# Patient Record
Sex: Female | Born: 1965
Health system: Southern US, Community
[De-identification: ages and names within clinical notes are randomized; demographics above are authoritative.]

## PROBLEM LIST (undated history)

## (undated) DIAGNOSIS — G43909 Migraine, unspecified, not intractable, without status migrainosus: Secondary | ICD-10-CM

## (undated) DIAGNOSIS — F419 Anxiety disorder, unspecified: Secondary | ICD-10-CM

## (undated) DIAGNOSIS — K219 Gastro-esophageal reflux disease without esophagitis: Secondary | ICD-10-CM

## (undated) DIAGNOSIS — R112 Nausea with vomiting, unspecified: Secondary | ICD-10-CM

## (undated) DIAGNOSIS — R7303 Prediabetes: Secondary | ICD-10-CM

## (undated) DIAGNOSIS — F329 Major depressive disorder, single episode, unspecified: Secondary | ICD-10-CM

## (undated) DIAGNOSIS — R32 Unspecified urinary incontinence: Secondary | ICD-10-CM

## (undated) DIAGNOSIS — M1990 Unspecified osteoarthritis, unspecified site: Secondary | ICD-10-CM

## (undated) DIAGNOSIS — Z9889 Other specified postprocedural states: Secondary | ICD-10-CM

## (undated) DIAGNOSIS — R3915 Urgency of urination: Secondary | ICD-10-CM

## (undated) DIAGNOSIS — D649 Anemia, unspecified: Secondary | ICD-10-CM

## (undated) DIAGNOSIS — F32A Depression, unspecified: Secondary | ICD-10-CM

## (undated) DIAGNOSIS — E785 Hyperlipidemia, unspecified: Secondary | ICD-10-CM

## (undated) DIAGNOSIS — I10 Essential (primary) hypertension: Secondary | ICD-10-CM

## (undated) DIAGNOSIS — J449 Chronic obstructive pulmonary disease, unspecified: Secondary | ICD-10-CM

## (undated) DIAGNOSIS — G4730 Sleep apnea, unspecified: Secondary | ICD-10-CM

## (undated) DIAGNOSIS — T7840XA Allergy, unspecified, initial encounter: Secondary | ICD-10-CM

## (undated) DIAGNOSIS — I499 Cardiac arrhythmia, unspecified: Secondary | ICD-10-CM

## (undated) HISTORY — PX: HYSTEROSCOPY W/ ENDOMETRIAL ABLATION: SUR665

## (undated) HISTORY — DX: Chronic obstructive pulmonary disease, unspecified: J44.9

## (undated) HISTORY — DX: Migraine, unspecified, not intractable, without status migrainosus: G43.909

## (undated) HISTORY — DX: Unspecified urinary incontinence: R32

## (undated) HISTORY — PX: MOUTH SURGERY: SHX715

## (undated) HISTORY — DX: Allergy, unspecified, initial encounter: T78.40XA

## (undated) HISTORY — DX: Depression, unspecified: F32.A

## (undated) HISTORY — PX: WISDOM TOOTH EXTRACTION: SHX21

## (undated) HISTORY — DX: Unspecified osteoarthritis, unspecified site: M19.90

## (undated) HISTORY — PX: OTHER SURGICAL HISTORY: SHX169

## (undated) HISTORY — DX: Hyperlipidemia, unspecified: E78.5

## (undated) HISTORY — DX: Anxiety disorder, unspecified: F41.9

## (undated) HISTORY — PX: LEEP: SHX91

## (undated) HISTORY — DX: Anemia, unspecified: D64.9

## (undated) HISTORY — DX: Major depressive disorder, single episode, unspecified: F32.9

## (undated) HISTORY — PX: ABLATION: SHX5711

---

## 1989-06-29 HISTORY — PX: HERNIA REPAIR: SHX51

## 1998-03-18 ENCOUNTER — Encounter: Admission: RE | Admit: 1998-03-18 | Discharge: 1998-03-18 | Payer: Self-pay | Admitting: Specialist

## 1999-08-23 ENCOUNTER — Encounter: Admission: RE | Admit: 1999-08-23 | Discharge: 1999-08-23 | Payer: Self-pay | Admitting: Family Medicine

## 1999-09-13 ENCOUNTER — Encounter: Admission: RE | Admit: 1999-09-13 | Discharge: 1999-09-13 | Payer: Self-pay | Admitting: Family Medicine

## 1999-10-20 ENCOUNTER — Encounter: Admission: RE | Admit: 1999-10-20 | Discharge: 1999-10-20 | Payer: Self-pay | Admitting: Sports Medicine

## 2000-05-09 ENCOUNTER — Encounter: Admission: RE | Admit: 2000-05-09 | Discharge: 2000-05-09 | Payer: Self-pay | Admitting: Family Medicine

## 2000-05-17 ENCOUNTER — Emergency Department (HOSPITAL_COMMUNITY): Admission: EM | Admit: 2000-05-17 | Discharge: 2000-05-17 | Payer: Self-pay | Admitting: Emergency Medicine

## 2000-05-17 ENCOUNTER — Ambulatory Visit (HOSPITAL_COMMUNITY): Admission: RE | Admit: 2000-05-17 | Discharge: 2000-05-17 | Payer: Self-pay

## 2000-05-29 ENCOUNTER — Encounter: Admission: RE | Admit: 2000-05-29 | Discharge: 2000-05-29 | Payer: Self-pay | Admitting: Family Medicine

## 2000-06-11 ENCOUNTER — Encounter: Admission: RE | Admit: 2000-06-11 | Discharge: 2000-06-11 | Payer: Self-pay | Admitting: Family Medicine

## 2000-12-11 ENCOUNTER — Encounter: Admission: RE | Admit: 2000-12-11 | Discharge: 2000-12-11 | Payer: Self-pay | Admitting: Family Medicine

## 2000-12-11 ENCOUNTER — Encounter: Payer: Self-pay | Admitting: Sports Medicine

## 2000-12-11 ENCOUNTER — Ambulatory Visit (HOSPITAL_COMMUNITY): Admission: RE | Admit: 2000-12-11 | Discharge: 2000-12-11 | Payer: Self-pay | Admitting: Family Medicine

## 2000-12-11 ENCOUNTER — Encounter: Admission: RE | Admit: 2000-12-11 | Discharge: 2000-12-11 | Payer: Self-pay | Admitting: Sports Medicine

## 2001-01-01 ENCOUNTER — Encounter: Admission: RE | Admit: 2001-01-01 | Discharge: 2001-01-01 | Payer: Self-pay | Admitting: Family Medicine

## 2001-03-21 ENCOUNTER — Encounter: Admission: RE | Admit: 2001-03-21 | Discharge: 2001-03-21 | Payer: Self-pay | Admitting: Family Medicine

## 2001-04-04 ENCOUNTER — Encounter: Admission: RE | Admit: 2001-04-04 | Discharge: 2001-04-04 | Payer: Self-pay | Admitting: Family Medicine

## 2001-05-25 ENCOUNTER — Inpatient Hospital Stay (HOSPITAL_COMMUNITY): Admission: AD | Admit: 2001-05-25 | Discharge: 2001-05-25 | Payer: Self-pay | Admitting: Obstetrics & Gynecology

## 2001-05-26 ENCOUNTER — Inpatient Hospital Stay (HOSPITAL_COMMUNITY): Admission: AD | Admit: 2001-05-26 | Discharge: 2001-05-26 | Payer: Self-pay | Admitting: Obstetrics & Gynecology

## 2001-05-28 ENCOUNTER — Encounter: Admission: RE | Admit: 2001-05-28 | Discharge: 2001-05-28 | Payer: Self-pay | Admitting: Family Medicine

## 2001-05-30 ENCOUNTER — Encounter: Admission: RE | Admit: 2001-05-30 | Discharge: 2001-05-30 | Payer: Self-pay

## 2001-06-12 ENCOUNTER — Encounter: Admission: RE | Admit: 2001-06-12 | Discharge: 2001-06-12 | Payer: Self-pay | Admitting: Family Medicine

## 2001-08-12 ENCOUNTER — Encounter: Admission: RE | Admit: 2001-08-12 | Discharge: 2001-08-12 | Payer: Self-pay | Admitting: Family Medicine

## 2001-08-14 ENCOUNTER — Emergency Department (HOSPITAL_COMMUNITY): Admission: EM | Admit: 2001-08-14 | Discharge: 2001-08-14 | Payer: Self-pay | Admitting: Emergency Medicine

## 2001-12-05 ENCOUNTER — Encounter: Admission: RE | Admit: 2001-12-05 | Discharge: 2001-12-05 | Payer: Self-pay | Admitting: Family Medicine

## 2002-02-04 ENCOUNTER — Inpatient Hospital Stay (HOSPITAL_COMMUNITY): Admission: AD | Admit: 2002-02-04 | Discharge: 2002-02-04 | Payer: Self-pay | Admitting: Obstetrics and Gynecology

## 2002-02-04 ENCOUNTER — Encounter: Payer: Self-pay | Admitting: Obstetrics and Gynecology

## 2002-02-17 ENCOUNTER — Emergency Department (HOSPITAL_COMMUNITY): Admission: EM | Admit: 2002-02-17 | Discharge: 2002-02-17 | Payer: Self-pay | Admitting: Emergency Medicine

## 2002-02-27 ENCOUNTER — Encounter: Admission: RE | Admit: 2002-02-27 | Discharge: 2002-02-27 | Payer: Self-pay | Admitting: Family Medicine

## 2002-03-01 ENCOUNTER — Emergency Department (HOSPITAL_COMMUNITY): Admission: EM | Admit: 2002-03-01 | Discharge: 2002-03-01 | Payer: Self-pay | Admitting: Emergency Medicine

## 2002-05-26 ENCOUNTER — Encounter: Admission: RE | Admit: 2002-05-26 | Discharge: 2002-05-26 | Payer: Self-pay | Admitting: Family Medicine

## 2002-06-26 ENCOUNTER — Encounter: Admission: RE | Admit: 2002-06-26 | Discharge: 2002-06-26 | Payer: Self-pay | Admitting: Family Medicine

## 2002-07-27 ENCOUNTER — Encounter: Admission: RE | Admit: 2002-07-27 | Discharge: 2002-07-27 | Payer: Self-pay | Admitting: Family Medicine

## 2002-11-02 ENCOUNTER — Encounter: Payer: Self-pay | Admitting: Emergency Medicine

## 2002-11-02 ENCOUNTER — Emergency Department (HOSPITAL_COMMUNITY): Admission: EM | Admit: 2002-11-02 | Discharge: 2002-11-02 | Payer: Self-pay | Admitting: Emergency Medicine

## 2002-12-04 ENCOUNTER — Encounter: Admission: RE | Admit: 2002-12-04 | Discharge: 2002-12-04 | Payer: Self-pay | Admitting: Family Medicine

## 2002-12-15 ENCOUNTER — Emergency Department (HOSPITAL_COMMUNITY): Admission: EM | Admit: 2002-12-15 | Discharge: 2002-12-15 | Payer: Self-pay | Admitting: Emergency Medicine

## 2002-12-18 ENCOUNTER — Encounter: Admission: RE | Admit: 2002-12-18 | Discharge: 2002-12-18 | Payer: Self-pay | Admitting: Family Medicine

## 2003-01-18 ENCOUNTER — Encounter: Admission: RE | Admit: 2003-01-18 | Discharge: 2003-01-18 | Payer: Self-pay | Admitting: Family Medicine

## 2003-01-19 ENCOUNTER — Encounter: Payer: Self-pay | Admitting: Sports Medicine

## 2003-01-19 ENCOUNTER — Encounter: Admission: RE | Admit: 2003-01-19 | Discharge: 2003-01-19 | Payer: Self-pay | Admitting: Sports Medicine

## 2003-02-03 ENCOUNTER — Encounter: Admission: RE | Admit: 2003-02-03 | Discharge: 2003-02-03 | Payer: Self-pay | Admitting: Sports Medicine

## 2003-02-09 ENCOUNTER — Encounter: Admission: RE | Admit: 2003-02-09 | Discharge: 2003-02-09 | Payer: Self-pay | Admitting: Sports Medicine

## 2003-02-09 ENCOUNTER — Encounter: Payer: Self-pay | Admitting: Sports Medicine

## 2003-02-11 ENCOUNTER — Encounter: Admission: RE | Admit: 2003-02-11 | Discharge: 2003-02-11 | Payer: Self-pay | Admitting: Sports Medicine

## 2003-03-01 ENCOUNTER — Encounter: Payer: Self-pay | Admitting: Obstetrics and Gynecology

## 2003-03-01 ENCOUNTER — Inpatient Hospital Stay (HOSPITAL_COMMUNITY): Admission: AD | Admit: 2003-03-01 | Discharge: 2003-03-01 | Payer: Self-pay | Admitting: Obstetrics and Gynecology

## 2003-04-06 ENCOUNTER — Encounter: Admission: RE | Admit: 2003-04-06 | Discharge: 2003-04-06 | Payer: Self-pay | Admitting: Family Medicine

## 2003-09-09 ENCOUNTER — Encounter: Admission: RE | Admit: 2003-09-09 | Discharge: 2003-09-09 | Payer: Self-pay | Admitting: Family Medicine

## 2003-09-15 ENCOUNTER — Encounter: Admission: RE | Admit: 2003-09-15 | Discharge: 2003-09-15 | Payer: Self-pay | Admitting: Sports Medicine

## 2003-11-04 ENCOUNTER — Encounter: Admission: RE | Admit: 2003-11-04 | Discharge: 2003-11-04 | Payer: Self-pay | Admitting: Family Medicine

## 2003-12-20 ENCOUNTER — Encounter: Admission: RE | Admit: 2003-12-20 | Discharge: 2003-12-20 | Payer: Self-pay | Admitting: Family Medicine

## 2004-01-05 ENCOUNTER — Encounter: Admission: RE | Admit: 2004-01-05 | Discharge: 2004-01-05 | Payer: Self-pay | Admitting: Family Medicine

## 2004-01-05 ENCOUNTER — Encounter: Admission: RE | Admit: 2004-01-05 | Discharge: 2004-01-05 | Payer: Self-pay | Admitting: Sports Medicine

## 2004-01-14 ENCOUNTER — Encounter: Admission: RE | Admit: 2004-01-14 | Discharge: 2004-01-14 | Payer: Self-pay | Admitting: Sports Medicine

## 2004-03-25 ENCOUNTER — Emergency Department (HOSPITAL_COMMUNITY): Admission: EM | Admit: 2004-03-25 | Discharge: 2004-03-25 | Payer: Self-pay | Admitting: Emergency Medicine

## 2004-03-30 ENCOUNTER — Encounter: Admission: RE | Admit: 2004-03-30 | Discharge: 2004-03-30 | Payer: Self-pay | Admitting: Family Medicine

## 2004-04-16 ENCOUNTER — Emergency Department (HOSPITAL_COMMUNITY): Admission: EM | Admit: 2004-04-16 | Discharge: 2004-04-16 | Payer: Self-pay | Admitting: Emergency Medicine

## 2004-08-29 ENCOUNTER — Ambulatory Visit: Payer: Self-pay | Admitting: Sports Medicine

## 2004-10-24 ENCOUNTER — Emergency Department (HOSPITAL_COMMUNITY): Admission: EM | Admit: 2004-10-24 | Discharge: 2004-10-24 | Payer: Self-pay | Admitting: Emergency Medicine

## 2004-12-05 ENCOUNTER — Ambulatory Visit: Payer: Self-pay | Admitting: Family Medicine

## 2004-12-14 ENCOUNTER — Emergency Department (HOSPITAL_COMMUNITY): Admission: EM | Admit: 2004-12-14 | Discharge: 2004-12-14 | Payer: Self-pay | Admitting: Family Medicine

## 2005-01-02 ENCOUNTER — Ambulatory Visit: Payer: Self-pay | Admitting: Family Medicine

## 2005-01-31 ENCOUNTER — Ambulatory Visit: Payer: Self-pay | Admitting: Family Medicine

## 2005-02-26 ENCOUNTER — Ambulatory Visit: Payer: Self-pay | Admitting: Sports Medicine

## 2005-04-10 ENCOUNTER — Inpatient Hospital Stay (HOSPITAL_COMMUNITY): Admission: AD | Admit: 2005-04-10 | Discharge: 2005-04-11 | Payer: Self-pay | Admitting: *Deleted

## 2005-04-11 ENCOUNTER — Ambulatory Visit: Payer: Self-pay | Admitting: Family Medicine

## 2005-04-18 ENCOUNTER — Inpatient Hospital Stay (HOSPITAL_COMMUNITY): Admission: RE | Admit: 2005-04-18 | Discharge: 2005-04-18 | Payer: Self-pay | Admitting: *Deleted

## 2005-05-09 ENCOUNTER — Ambulatory Visit: Payer: Self-pay | Admitting: Family Medicine

## 2005-05-26 ENCOUNTER — Inpatient Hospital Stay (HOSPITAL_COMMUNITY): Admission: AD | Admit: 2005-05-26 | Discharge: 2005-05-26 | Payer: Self-pay | Admitting: Family Medicine

## 2005-05-29 ENCOUNTER — Ambulatory Visit: Payer: Self-pay | Admitting: Family Medicine

## 2005-05-30 ENCOUNTER — Inpatient Hospital Stay (HOSPITAL_COMMUNITY): Admission: AD | Admit: 2005-05-30 | Discharge: 2005-05-30 | Payer: Self-pay | Admitting: Obstetrics and Gynecology

## 2005-05-31 ENCOUNTER — Ambulatory Visit (HOSPITAL_COMMUNITY): Admission: AD | Admit: 2005-05-31 | Discharge: 2005-05-31 | Payer: Self-pay | Admitting: Gynecology

## 2005-05-31 ENCOUNTER — Encounter (INDEPENDENT_AMBULATORY_CARE_PROVIDER_SITE_OTHER): Payer: Self-pay | Admitting: Specialist

## 2005-05-31 HISTORY — PX: DILATION AND CURETTAGE OF UTERUS: SHX78

## 2005-06-15 ENCOUNTER — Ambulatory Visit: Payer: Self-pay | Admitting: Family Medicine

## 2005-08-08 ENCOUNTER — Ambulatory Visit: Payer: Self-pay | Admitting: Family Medicine

## 2005-08-29 ENCOUNTER — Encounter (INDEPENDENT_AMBULATORY_CARE_PROVIDER_SITE_OTHER): Payer: Self-pay | Admitting: *Deleted

## 2005-09-04 ENCOUNTER — Ambulatory Visit: Payer: Self-pay | Admitting: Family Medicine

## 2005-09-05 ENCOUNTER — Emergency Department (HOSPITAL_COMMUNITY): Admission: EM | Admit: 2005-09-05 | Discharge: 2005-09-06 | Payer: Self-pay | Admitting: Emergency Medicine

## 2005-09-13 ENCOUNTER — Ambulatory Visit: Payer: Self-pay | Admitting: Sports Medicine

## 2005-11-07 ENCOUNTER — Ambulatory Visit (HOSPITAL_COMMUNITY): Admission: RE | Admit: 2005-11-07 | Discharge: 2005-11-07 | Payer: Self-pay | Admitting: Emergency Medicine

## 2005-11-20 ENCOUNTER — Encounter: Admission: RE | Admit: 2005-11-20 | Discharge: 2005-11-20 | Payer: Self-pay | Admitting: *Deleted

## 2005-11-20 ENCOUNTER — Ambulatory Visit: Payer: Self-pay | Admitting: Family Medicine

## 2005-11-21 ENCOUNTER — Ambulatory Visit (HOSPITAL_COMMUNITY): Admission: RE | Admit: 2005-11-21 | Discharge: 2005-11-21 | Payer: Self-pay | Admitting: *Deleted

## 2005-11-30 ENCOUNTER — Ambulatory Visit: Payer: Self-pay | Admitting: Family Medicine

## 2005-12-06 ENCOUNTER — Ambulatory Visit (HOSPITAL_COMMUNITY): Admission: RE | Admit: 2005-12-06 | Discharge: 2005-12-06 | Payer: Self-pay | Admitting: *Deleted

## 2005-12-10 ENCOUNTER — Ambulatory Visit: Payer: Self-pay | Admitting: Family Medicine

## 2005-12-12 ENCOUNTER — Ambulatory Visit: Payer: Self-pay | Admitting: Family Medicine

## 2005-12-18 ENCOUNTER — Ambulatory Visit: Payer: Self-pay | Admitting: Family Medicine

## 2006-01-07 ENCOUNTER — Ambulatory Visit: Payer: Self-pay | Admitting: Sports Medicine

## 2006-02-18 ENCOUNTER — Ambulatory Visit: Payer: Self-pay | Admitting: Family Medicine

## 2006-02-19 ENCOUNTER — Ambulatory Visit (HOSPITAL_COMMUNITY): Admission: RE | Admit: 2006-02-19 | Discharge: 2006-02-19 | Payer: Self-pay | Admitting: Family Medicine

## 2006-05-31 ENCOUNTER — Ambulatory Visit: Payer: Self-pay | Admitting: Sports Medicine

## 2006-08-09 ENCOUNTER — Ambulatory Visit: Payer: Self-pay | Admitting: Family Medicine

## 2006-08-12 ENCOUNTER — Ambulatory Visit (HOSPITAL_COMMUNITY): Admission: RE | Admit: 2006-08-12 | Discharge: 2006-08-12 | Payer: Self-pay | Admitting: Sports Medicine

## 2006-08-19 ENCOUNTER — Emergency Department (HOSPITAL_COMMUNITY): Admission: EM | Admit: 2006-08-19 | Discharge: 2006-08-19 | Payer: Self-pay | Admitting: Emergency Medicine

## 2006-09-15 ENCOUNTER — Inpatient Hospital Stay (HOSPITAL_COMMUNITY): Admission: AD | Admit: 2006-09-15 | Discharge: 2006-09-15 | Payer: Self-pay | Admitting: Gynecology

## 2006-09-16 ENCOUNTER — Ambulatory Visit: Payer: Self-pay | Admitting: Family Medicine

## 2006-09-25 ENCOUNTER — Ambulatory Visit: Payer: Self-pay | Admitting: Family Medicine

## 2006-10-10 ENCOUNTER — Ambulatory Visit: Payer: Self-pay | Admitting: Gynecology

## 2006-11-01 ENCOUNTER — Ambulatory Visit: Payer: Self-pay | Admitting: Gynecology

## 2006-11-01 ENCOUNTER — Ambulatory Visit (HOSPITAL_COMMUNITY): Admission: RE | Admit: 2006-11-01 | Discharge: 2006-11-01 | Payer: Self-pay | Admitting: Gynecology

## 2006-11-01 HISTORY — PX: TUBAL LIGATION: SHX77

## 2006-11-04 ENCOUNTER — Ambulatory Visit: Payer: Self-pay | Admitting: Sports Medicine

## 2006-12-26 DIAGNOSIS — J309 Allergic rhinitis, unspecified: Secondary | ICD-10-CM | POA: Insufficient documentation

## 2006-12-26 DIAGNOSIS — N800 Endometriosis of uterus: Secondary | ICD-10-CM | POA: Insufficient documentation

## 2006-12-26 DIAGNOSIS — F418 Other specified anxiety disorders: Secondary | ICD-10-CM | POA: Insufficient documentation

## 2006-12-26 DIAGNOSIS — N8003 Adenomyosis of the uterus: Secondary | ICD-10-CM | POA: Insufficient documentation

## 2006-12-26 DIAGNOSIS — K219 Gastro-esophageal reflux disease without esophagitis: Secondary | ICD-10-CM | POA: Insufficient documentation

## 2006-12-26 DIAGNOSIS — N809 Endometriosis, unspecified: Secondary | ICD-10-CM

## 2006-12-26 DIAGNOSIS — G43909 Migraine, unspecified, not intractable, without status migrainosus: Secondary | ICD-10-CM | POA: Insufficient documentation

## 2006-12-26 DIAGNOSIS — J45909 Unspecified asthma, uncomplicated: Secondary | ICD-10-CM | POA: Insufficient documentation

## 2006-12-27 ENCOUNTER — Encounter (INDEPENDENT_AMBULATORY_CARE_PROVIDER_SITE_OTHER): Payer: Self-pay | Admitting: *Deleted

## 2007-04-04 ENCOUNTER — Ambulatory Visit: Payer: Self-pay | Admitting: Family Medicine

## 2007-04-04 LAB — CONVERTED CEMR LAB
Glucose, Urine, Semiquant: NEGATIVE
Nitrite: NEGATIVE
Protein, U semiquant: NEGATIVE
Specific Gravity, Urine: 1.025
Urobilinogen, UA: 1
WBC Urine, dipstick: NEGATIVE
pH: 7

## 2007-05-05 ENCOUNTER — Telehealth: Payer: Self-pay | Admitting: *Deleted

## 2007-05-06 ENCOUNTER — Ambulatory Visit: Payer: Self-pay | Admitting: Family Medicine

## 2007-05-06 ENCOUNTER — Encounter: Admission: RE | Admit: 2007-05-06 | Discharge: 2007-05-06 | Payer: Self-pay | Admitting: Sports Medicine

## 2007-05-08 ENCOUNTER — Encounter (INDEPENDENT_AMBULATORY_CARE_PROVIDER_SITE_OTHER): Payer: Self-pay | Admitting: Family Medicine

## 2007-07-22 ENCOUNTER — Telehealth (INDEPENDENT_AMBULATORY_CARE_PROVIDER_SITE_OTHER): Payer: Self-pay | Admitting: *Deleted

## 2007-07-23 ENCOUNTER — Encounter (INDEPENDENT_AMBULATORY_CARE_PROVIDER_SITE_OTHER): Payer: Self-pay | Admitting: *Deleted

## 2007-07-23 ENCOUNTER — Ambulatory Visit: Payer: Self-pay | Admitting: Family Medicine

## 2007-07-23 ENCOUNTER — Telehealth: Payer: Self-pay | Admitting: *Deleted

## 2007-09-04 ENCOUNTER — Ambulatory Visit: Payer: Self-pay | Admitting: Family Medicine

## 2007-09-04 ENCOUNTER — Encounter (INDEPENDENT_AMBULATORY_CARE_PROVIDER_SITE_OTHER): Payer: Self-pay | Admitting: Family Medicine

## 2007-09-04 LAB — CONVERTED CEMR LAB
AST: 18 units/L (ref 0–37)
BUN: 17 mg/dL (ref 6–23)
CO2: 26 meq/L (ref 19–32)
Calcium: 9.7 mg/dL (ref 8.4–10.5)
Chlamydia, DNA Probe: NEGATIVE
Cholesterol: 213 mg/dL — ABNORMAL HIGH (ref 0–200)
HDL: 63 mg/dL (ref >39)
LDL Cholesterol: 125 mg/dL — ABNORMAL HIGH (ref 0–99)
Sodium: 136 meq/L (ref 135–145)
Total CHOL/HDL Ratio: 3.4
Total Protein: 8 g/dL (ref 6.0–8.3)
Triglycerides: 126 mg/dL (ref <150)
VLDL: 25 mg/dL (ref 0–40)

## 2007-09-09 ENCOUNTER — Encounter (INDEPENDENT_AMBULATORY_CARE_PROVIDER_SITE_OTHER): Payer: Self-pay | Admitting: Family Medicine

## 2007-11-21 ENCOUNTER — Encounter: Payer: Self-pay | Admitting: *Deleted

## 2007-11-21 ENCOUNTER — Ambulatory Visit: Payer: Self-pay | Admitting: Family Medicine

## 2007-11-21 ENCOUNTER — Telehealth (INDEPENDENT_AMBULATORY_CARE_PROVIDER_SITE_OTHER): Payer: Self-pay | Admitting: *Deleted

## 2007-12-22 ENCOUNTER — Emergency Department (HOSPITAL_COMMUNITY): Admission: EM | Admit: 2007-12-22 | Discharge: 2007-12-23 | Payer: Self-pay | Admitting: Emergency Medicine

## 2007-12-25 ENCOUNTER — Encounter (INDEPENDENT_AMBULATORY_CARE_PROVIDER_SITE_OTHER): Payer: Self-pay | Admitting: Family Medicine

## 2007-12-25 ENCOUNTER — Ambulatory Visit: Payer: Self-pay | Admitting: Family Medicine

## 2007-12-26 ENCOUNTER — Telehealth: Payer: Self-pay | Admitting: *Deleted

## 2007-12-26 ENCOUNTER — Encounter (INDEPENDENT_AMBULATORY_CARE_PROVIDER_SITE_OTHER): Payer: Self-pay | Admitting: *Deleted

## 2007-12-31 ENCOUNTER — Encounter (INDEPENDENT_AMBULATORY_CARE_PROVIDER_SITE_OTHER): Payer: Self-pay | Admitting: Family Medicine

## 2008-02-13 ENCOUNTER — Encounter: Payer: Self-pay | Admitting: *Deleted

## 2008-03-15 ENCOUNTER — Telehealth: Payer: Self-pay | Admitting: *Deleted

## 2008-03-16 ENCOUNTER — Ambulatory Visit: Payer: Self-pay | Admitting: Family Medicine

## 2008-03-16 LAB — CONVERTED CEMR LAB
Blood in Urine, dipstick: NEGATIVE
Glucose, Urine, Semiquant: NEGATIVE
Nitrite: NEGATIVE
Urobilinogen, UA: 0.2

## 2008-03-18 ENCOUNTER — Telehealth: Payer: Self-pay | Admitting: *Deleted

## 2008-05-21 ENCOUNTER — Encounter (INDEPENDENT_AMBULATORY_CARE_PROVIDER_SITE_OTHER): Payer: Self-pay | Admitting: Family Medicine

## 2008-05-21 ENCOUNTER — Ambulatory Visit: Payer: Self-pay | Admitting: Family Medicine

## 2008-06-18 ENCOUNTER — Encounter: Payer: Self-pay | Admitting: *Deleted

## 2008-06-30 ENCOUNTER — Ambulatory Visit: Payer: Self-pay | Admitting: Family Medicine

## 2008-07-07 ENCOUNTER — Ambulatory Visit (HOSPITAL_BASED_OUTPATIENT_CLINIC_OR_DEPARTMENT_OTHER): Admission: RE | Admit: 2008-07-07 | Discharge: 2008-07-07 | Payer: Self-pay | Admitting: Family Medicine

## 2008-07-10 ENCOUNTER — Encounter (INDEPENDENT_AMBULATORY_CARE_PROVIDER_SITE_OTHER): Payer: Self-pay | Admitting: Family Medicine

## 2008-07-10 ENCOUNTER — Ambulatory Visit: Payer: Self-pay | Admitting: Internal Medicine

## 2008-08-03 ENCOUNTER — Encounter: Payer: Self-pay | Admitting: *Deleted

## 2008-08-18 ENCOUNTER — Ambulatory Visit: Payer: Self-pay | Admitting: Family Medicine

## 2008-08-18 ENCOUNTER — Encounter: Admission: RE | Admit: 2008-08-18 | Discharge: 2008-08-18 | Payer: Self-pay | Admitting: Family Medicine

## 2008-08-18 ENCOUNTER — Encounter (INDEPENDENT_AMBULATORY_CARE_PROVIDER_SITE_OTHER): Payer: Self-pay | Admitting: Family Medicine

## 2008-08-18 LAB — CONVERTED CEMR LAB
CO2: 25 meq/L (ref 19–32)
Calcium: 9.3 mg/dL (ref 8.4–10.5)

## 2008-08-19 ENCOUNTER — Telehealth: Payer: Self-pay | Admitting: *Deleted

## 2008-08-20 ENCOUNTER — Ambulatory Visit: Payer: Self-pay | Admitting: Vascular Surgery

## 2008-08-20 ENCOUNTER — Ambulatory Visit (HOSPITAL_COMMUNITY): Admission: RE | Admit: 2008-08-20 | Discharge: 2008-08-20 | Payer: Self-pay | Admitting: Family Medicine

## 2008-08-20 ENCOUNTER — Encounter: Payer: Self-pay | Admitting: Family Medicine

## 2008-08-20 ENCOUNTER — Telehealth (INDEPENDENT_AMBULATORY_CARE_PROVIDER_SITE_OTHER): Payer: Self-pay | Admitting: Family Medicine

## 2008-08-23 ENCOUNTER — Telehealth (INDEPENDENT_AMBULATORY_CARE_PROVIDER_SITE_OTHER): Payer: Self-pay | Admitting: Family Medicine

## 2008-08-25 ENCOUNTER — Ambulatory Visit: Payer: Self-pay | Admitting: Sports Medicine

## 2008-08-25 ENCOUNTER — Encounter (INDEPENDENT_AMBULATORY_CARE_PROVIDER_SITE_OTHER): Payer: Self-pay | Admitting: Family Medicine

## 2008-09-02 ENCOUNTER — Ambulatory Visit: Payer: Self-pay | Admitting: Sports Medicine

## 2008-09-17 ENCOUNTER — Ambulatory Visit: Payer: Self-pay | Admitting: Sports Medicine

## 2008-09-20 ENCOUNTER — Encounter (INDEPENDENT_AMBULATORY_CARE_PROVIDER_SITE_OTHER): Payer: Self-pay | Admitting: Family Medicine

## 2008-09-20 ENCOUNTER — Ambulatory Visit: Payer: Self-pay | Admitting: Family Medicine

## 2008-09-20 DIAGNOSIS — E7800 Pure hypercholesterolemia, unspecified: Secondary | ICD-10-CM | POA: Insufficient documentation

## 2008-09-20 DIAGNOSIS — N951 Menopausal and female climacteric states: Secondary | ICD-10-CM | POA: Insufficient documentation

## 2008-09-20 DIAGNOSIS — N920 Excessive and frequent menstruation with regular cycle: Secondary | ICD-10-CM | POA: Insufficient documentation

## 2008-09-20 DIAGNOSIS — E785 Hyperlipidemia, unspecified: Secondary | ICD-10-CM

## 2008-09-30 ENCOUNTER — Encounter (INDEPENDENT_AMBULATORY_CARE_PROVIDER_SITE_OTHER): Payer: Self-pay | Admitting: Family Medicine

## 2008-09-30 LAB — CONVERTED CEMR LAB
ALT: 12 units/L (ref 0–35)
Alkaline Phosphatase: 42 units/L (ref 39–117)
BUN: 12 mg/dL (ref 6–23)
CO2: 25 meq/L (ref 19–32)
Calcium: 9.4 mg/dL (ref 8.4–10.5)
Chlamydia, DNA Probe: NEGATIVE
Chloride: 103 meq/L (ref 96–112)
Cholesterol: 199 mg/dL (ref 0–200)
GC Probe Amp, Genital: NEGATIVE
HDL: 53 mg/dL (ref >39)
Hemoglobin: 12 g/dL (ref 12.0–15.0)
LDL Cholesterol: 129 mg/dL — ABNORMAL HIGH (ref 0–99)
Potassium: 4.1 meq/L (ref 3.5–5.3)
RBC: 3.85 M/uL — ABNORMAL LOW (ref 3.87–5.11)
RDW: 13 % (ref 11.5–15.5)
Total Bilirubin: 0.3 mg/dL (ref 0.3–1.2)
Total Protein: 7.1 g/dL (ref 6.0–8.3)
VLDL: 17 mg/dL (ref 0–40)
Vit D, 1,25-Dihydroxy: 10 — ABNORMAL LOW (ref 30–89)

## 2008-10-12 ENCOUNTER — Encounter (INDEPENDENT_AMBULATORY_CARE_PROVIDER_SITE_OTHER): Payer: Self-pay | Admitting: Family Medicine

## 2008-10-15 ENCOUNTER — Ambulatory Visit: Payer: Self-pay | Admitting: Sports Medicine

## 2008-10-19 ENCOUNTER — Encounter: Admission: RE | Admit: 2008-10-19 | Discharge: 2008-10-19 | Payer: Self-pay | Admitting: Family Medicine

## 2008-11-08 ENCOUNTER — Ambulatory Visit: Payer: Self-pay | Admitting: Family Medicine

## 2008-11-08 ENCOUNTER — Telehealth: Payer: Self-pay | Admitting: *Deleted

## 2008-11-08 LAB — CONVERTED CEMR LAB
Blood in Urine, dipstick: NEGATIVE
Glucose, Urine, Semiquant: NEGATIVE
Protein, U semiquant: NEGATIVE
pH: 5.5

## 2008-11-24 ENCOUNTER — Ambulatory Visit: Payer: Self-pay | Admitting: Sports Medicine

## 2008-12-10 ENCOUNTER — Emergency Department (HOSPITAL_COMMUNITY): Admission: EM | Admit: 2008-12-10 | Discharge: 2008-12-10 | Payer: Self-pay | Admitting: Emergency Medicine

## 2008-12-13 ENCOUNTER — Telehealth (INDEPENDENT_AMBULATORY_CARE_PROVIDER_SITE_OTHER): Payer: Self-pay | Admitting: Family Medicine

## 2009-02-14 ENCOUNTER — Ambulatory Visit: Payer: Self-pay | Admitting: Family Medicine

## 2009-02-14 ENCOUNTER — Encounter (INDEPENDENT_AMBULATORY_CARE_PROVIDER_SITE_OTHER): Payer: Self-pay | Admitting: Family Medicine

## 2009-02-14 LAB — CONVERTED CEMR LAB
Bilirubin Urine: NEGATIVE
Glucose, Urine, Semiquant: NEGATIVE
Nitrite: NEGATIVE
Specific Gravity, Urine: 1.025
Whiff Test: NEGATIVE
pH: 5.5

## 2009-02-15 DIAGNOSIS — N3946 Mixed incontinence: Secondary | ICD-10-CM | POA: Insufficient documentation

## 2009-02-15 LAB — CONVERTED CEMR LAB: Chlamydia, DNA Probe: NEGATIVE

## 2009-02-24 ENCOUNTER — Telehealth (INDEPENDENT_AMBULATORY_CARE_PROVIDER_SITE_OTHER): Payer: Self-pay | Admitting: *Deleted

## 2009-02-24 ENCOUNTER — Encounter (INDEPENDENT_AMBULATORY_CARE_PROVIDER_SITE_OTHER): Payer: Self-pay | Admitting: Family Medicine

## 2009-03-05 ENCOUNTER — Telehealth: Payer: Self-pay | Admitting: Family Medicine

## 2009-04-11 ENCOUNTER — Telehealth: Payer: Self-pay | Admitting: Family Medicine

## 2009-04-12 ENCOUNTER — Ambulatory Visit: Payer: Self-pay | Admitting: Family Medicine

## 2009-04-21 ENCOUNTER — Telehealth: Payer: Self-pay | Admitting: Family Medicine

## 2009-04-22 ENCOUNTER — Ambulatory Visit: Payer: Self-pay | Admitting: Family Medicine

## 2009-04-22 ENCOUNTER — Encounter: Payer: Self-pay | Admitting: Family Medicine

## 2009-04-22 ENCOUNTER — Encounter (INDEPENDENT_AMBULATORY_CARE_PROVIDER_SITE_OTHER): Payer: Self-pay | Admitting: *Deleted

## 2009-04-22 LAB — CONVERTED CEMR LAB
Basophils Absolute: 0 10*3/uL (ref 0.0–0.1)
Basophils Relative: 1 % (ref 0–1)
Eosinophils Absolute: 0.1 10*3/uL (ref 0.0–0.7)
Eosinophils Relative: 2 % (ref 0–5)
Glucose, Urine, Semiquant: NEGATIVE
Ketones, urine, test strip: NEGATIVE
Lymphocytes Relative: 32 % (ref 12–46)
Monocytes Relative: 11 % (ref 3–12)
Neutro Abs: 2.7 10*3/uL (ref 1.7–7.7)
Neutrophils Relative %: 54 % (ref 43–77)
Nitrite: NEGATIVE
Platelets: 360 10*3/uL (ref 150–400)
Whiff Test: NEGATIVE

## 2009-04-24 ENCOUNTER — Encounter: Payer: Self-pay | Admitting: Family Medicine

## 2009-04-26 ENCOUNTER — Ambulatory Visit: Payer: Self-pay | Admitting: Family Medicine

## 2009-04-26 ENCOUNTER — Telehealth: Payer: Self-pay | Admitting: Family Medicine

## 2009-05-17 ENCOUNTER — Ambulatory Visit: Payer: Self-pay | Admitting: Family Medicine

## 2009-05-17 ENCOUNTER — Telehealth: Payer: Self-pay | Admitting: Family Medicine

## 2009-05-17 DIAGNOSIS — N3289 Other specified disorders of bladder: Secondary | ICD-10-CM | POA: Insufficient documentation

## 2009-05-17 LAB — CONVERTED CEMR LAB
Bilirubin Urine: NEGATIVE
Blood in Urine, dipstick: NEGATIVE
Glucose, Urine, Semiquant: NEGATIVE
Ketones, urine, test strip: NEGATIVE

## 2009-05-19 ENCOUNTER — Ambulatory Visit (HOSPITAL_COMMUNITY): Admission: RE | Admit: 2009-05-19 | Discharge: 2009-05-19 | Payer: Self-pay | Admitting: Family Medicine

## 2009-05-23 ENCOUNTER — Encounter: Payer: Self-pay | Admitting: Family Medicine

## 2009-06-07 ENCOUNTER — Ambulatory Visit: Payer: Self-pay | Admitting: Family Medicine

## 2009-06-07 ENCOUNTER — Encounter: Payer: Self-pay | Admitting: Family Medicine

## 2009-06-08 ENCOUNTER — Telehealth: Payer: Self-pay | Admitting: *Deleted

## 2009-06-08 ENCOUNTER — Telehealth: Payer: Self-pay | Admitting: Family Medicine

## 2009-06-12 ENCOUNTER — Telehealth: Payer: Self-pay | Admitting: Family Medicine

## 2009-06-13 ENCOUNTER — Telehealth: Payer: Self-pay | Admitting: Family Medicine

## 2009-06-13 ENCOUNTER — Ambulatory Visit: Payer: Self-pay | Admitting: Family Medicine

## 2009-07-05 ENCOUNTER — Encounter: Admission: RE | Admit: 2009-07-05 | Discharge: 2009-07-28 | Payer: Self-pay | Admitting: Family Medicine

## 2009-08-31 ENCOUNTER — Ambulatory Visit: Payer: Self-pay | Admitting: Family Medicine

## 2009-08-31 ENCOUNTER — Encounter: Payer: Self-pay | Admitting: Family Medicine

## 2009-08-31 LAB — CONVERTED CEMR LAB
Chlamydia, DNA Probe: NEGATIVE
GC Probe Amp, Genital: NEGATIVE
HCT: 37.2 % (ref 36.0–46.0)
MCHC: 32.5 g/dL (ref 30.0–36.0)
MCV: 94.2 fL (ref 78.0–100.0)
RBC: 3.95 M/uL (ref 3.87–5.11)
RDW: 13.2 % (ref 11.5–15.5)

## 2009-09-01 ENCOUNTER — Encounter: Payer: Self-pay | Admitting: Family Medicine

## 2009-09-25 ENCOUNTER — Emergency Department (HOSPITAL_COMMUNITY): Admission: EM | Admit: 2009-09-25 | Discharge: 2009-09-25 | Payer: Self-pay | Admitting: Emergency Medicine

## 2009-09-28 ENCOUNTER — Ambulatory Visit: Payer: Self-pay | Admitting: Family Medicine

## 2009-09-29 ENCOUNTER — Encounter: Admission: RE | Admit: 2009-09-29 | Discharge: 2009-09-29 | Payer: Self-pay | Admitting: Family Medicine

## 2009-10-31 ENCOUNTER — Ambulatory Visit: Payer: Self-pay | Admitting: Family Medicine

## 2009-11-29 ENCOUNTER — Ambulatory Visit: Payer: Self-pay | Admitting: Family Medicine

## 2009-11-29 ENCOUNTER — Telehealth: Payer: Self-pay | Admitting: Family Medicine

## 2009-12-14 ENCOUNTER — Emergency Department (HOSPITAL_COMMUNITY): Admission: EM | Admit: 2009-12-14 | Discharge: 2009-12-15 | Payer: Self-pay | Admitting: Emergency Medicine

## 2010-03-14 ENCOUNTER — Ambulatory Visit: Payer: Self-pay | Admitting: Family Medicine

## 2010-03-14 ENCOUNTER — Encounter: Admission: RE | Admit: 2010-03-14 | Discharge: 2010-03-14 | Payer: Self-pay | Admitting: Family Medicine

## 2010-03-14 LAB — CONVERTED CEMR LAB
CO2: 30 meq/L (ref 19–32)
Chloride: 103 meq/L (ref 96–112)
Glucose, Bld: 89 mg/dL (ref 70–99)
Hemoglobin: 12.9 g/dL (ref 12.0–15.0)
MCV: 94.5 fL (ref 78.0–100.0)
Platelets: 306 10*3/uL (ref 150–400)
RBC: 3.95 M/uL (ref 3.87–5.11)
RDW: 13.1 % (ref 11.5–15.5)
WBC: 4.4 10*3/uL (ref 4.0–10.5)

## 2010-03-15 ENCOUNTER — Ambulatory Visit: Payer: Self-pay | Admitting: Family Medicine

## 2010-03-15 ENCOUNTER — Encounter (INDEPENDENT_AMBULATORY_CARE_PROVIDER_SITE_OTHER): Payer: Self-pay | Admitting: *Deleted

## 2010-04-05 ENCOUNTER — Encounter: Payer: Self-pay | Admitting: *Deleted

## 2010-04-05 ENCOUNTER — Ambulatory Visit: Payer: Self-pay | Admitting: Family Medicine

## 2010-04-14 ENCOUNTER — Encounter: Payer: Self-pay | Admitting: Family Medicine

## 2010-05-11 ENCOUNTER — Encounter: Payer: Self-pay | Admitting: Family Medicine

## 2010-05-11 ENCOUNTER — Ambulatory Visit: Payer: Self-pay | Admitting: Family Medicine

## 2010-05-11 DIAGNOSIS — R5383 Other fatigue: Secondary | ICD-10-CM

## 2010-05-11 DIAGNOSIS — R5381 Other malaise: Secondary | ICD-10-CM | POA: Insufficient documentation

## 2010-05-11 LAB — CONVERTED CEMR LAB: TSH: 2.324 microintl units/mL (ref 0.350–4.500)

## 2010-06-01 ENCOUNTER — Telehealth: Payer: Self-pay | Admitting: Sports Medicine

## 2010-06-02 ENCOUNTER — Observation Stay (HOSPITAL_COMMUNITY): Admission: EM | Admit: 2010-06-02 | Discharge: 2010-06-03 | Payer: Self-pay | Admitting: Emergency Medicine

## 2010-06-02 ENCOUNTER — Ambulatory Visit: Payer: Self-pay | Admitting: Family Medicine

## 2010-06-02 ENCOUNTER — Encounter: Payer: Self-pay | Admitting: Family Medicine

## 2010-06-02 DIAGNOSIS — R079 Chest pain, unspecified: Secondary | ICD-10-CM | POA: Insufficient documentation

## 2010-06-08 ENCOUNTER — Ambulatory Visit: Payer: Self-pay | Admitting: Family Medicine

## 2010-06-23 ENCOUNTER — Ambulatory Visit: Payer: Self-pay | Admitting: Cardiovascular Disease

## 2010-06-28 ENCOUNTER — Ambulatory Visit: Payer: Self-pay | Admitting: Cardiovascular Disease

## 2010-06-28 ENCOUNTER — Ambulatory Visit (HOSPITAL_COMMUNITY): Admission: RE | Admit: 2010-06-28 | Discharge: 2010-06-28 | Payer: Self-pay | Admitting: Cardiovascular Disease

## 2010-06-28 ENCOUNTER — Encounter: Payer: Self-pay | Admitting: Cardiovascular Disease

## 2010-09-08 ENCOUNTER — Ambulatory Visit: Payer: Self-pay | Admitting: Family Medicine

## 2010-09-08 DIAGNOSIS — G4733 Obstructive sleep apnea (adult) (pediatric): Secondary | ICD-10-CM | POA: Insufficient documentation

## 2010-10-03 ENCOUNTER — Telehealth: Payer: Self-pay | Admitting: *Deleted

## 2010-10-05 ENCOUNTER — Emergency Department (HOSPITAL_COMMUNITY)
Admission: EM | Admit: 2010-10-05 | Discharge: 2010-10-05 | Payer: Self-pay | Source: Home / Self Care | Admitting: Emergency Medicine

## 2010-10-06 ENCOUNTER — Ambulatory Visit: Payer: Self-pay | Admitting: Family Medicine

## 2010-10-16 ENCOUNTER — Ambulatory Visit: Payer: Self-pay

## 2010-10-18 ENCOUNTER — Encounter
Admission: RE | Admit: 2010-10-18 | Discharge: 2010-10-18 | Payer: Self-pay | Source: Home / Self Care | Attending: Family Medicine | Admitting: Family Medicine

## 2010-10-24 ENCOUNTER — Encounter: Payer: Self-pay | Admitting: Family Medicine

## 2010-10-31 ENCOUNTER — Encounter: Payer: Self-pay | Admitting: Family Medicine

## 2010-11-01 ENCOUNTER — Ambulatory Visit: Payer: Self-pay | Admitting: Cardiovascular Disease

## 2010-11-02 ENCOUNTER — Ambulatory Visit: Admission: RE | Admit: 2010-11-02 | Discharge: 2010-11-02 | Payer: Self-pay | Source: Home / Self Care

## 2010-11-02 DIAGNOSIS — K644 Residual hemorrhoidal skin tags: Secondary | ICD-10-CM | POA: Insufficient documentation

## 2010-11-09 ENCOUNTER — Ambulatory Visit (HOSPITAL_BASED_OUTPATIENT_CLINIC_OR_DEPARTMENT_OTHER)
Admission: RE | Admit: 2010-11-09 | Discharge: 2010-11-09 | Payer: Self-pay | Source: Home / Self Care | Attending: Family Medicine | Admitting: Family Medicine

## 2010-11-14 ENCOUNTER — Encounter: Payer: Self-pay | Admitting: Family Medicine

## 2010-11-19 ENCOUNTER — Encounter: Payer: Self-pay | Admitting: Family Medicine

## 2010-11-20 ENCOUNTER — Encounter: Payer: Self-pay | Admitting: Family Medicine

## 2010-11-28 NOTE — Assessment & Plan Note (Signed)
Summary: FMLA paper,df   Vital Signs:  Patient profile:   45 year old female Height:      61 inches Weight:      161 pounds BMI:     30.53 BSA:     1.72 O2 Sat:      99 % on Room air Temp:     98.3 degrees F Pulse rate:   65 / minute BP sitting:   131 / 74  Vitals Entered By: Jone Baseman CMA (May 11, 2010 1:41 PM)  O2 Flow:  Room air CC: fatigue, SOB Is Patient Diabetic? No Pain Assessment Patient in pain? no        Primary Care Provider:  Edd Arbour  CC:  fatigue and SOB.  History of Present Illness: fatigue/SOB: has noticed now for several weeks but is getting worse.  she thinks heat is making it worse.  she reports she feels short of breath just walking from car into the building.  she is using her albuterol inhaler (without spacer) at least 2 times daily.  she reports compliance with her flovent, singular and allegra.  she also reports intermittently using a nasal spray that is prescription even though it isn't on her med list. in addition to the above for the past few weeks shes also noted increased nasal congestion partirucalrly at night, snoring, poor appetite, mood swings and agitation.  she also has felt intermittent chest tightness partiucarlly when her asthma is flaring.  she reports she has seen the cardiologist before and has been cleared from a cardiovascular standpoint.  she also has had a sleep study which was negative.  she thinks her weight is down about 20 lbs over the past few months.  she has been under a great deal of stress at work and has changed to 3rd shift 8 hour shifts.       she is here also requesting her FMLA paperwork be completed for her asthma and migraines.  - done and scanned in  Habits & Providers  Alcohol-Tobacco-Diet     Tobacco Status: never  Current Medications (verified): 1)  Allegra 180 Mg Tabs (Fexofenadine Hcl) .... Take 1 Tablet By Mouth Every Night 2)  Ventolin Hfa 108 (90 Base) Mcg/act Aers (Albuterol Sulfate)  .... 2 Puffs Inhaled Every 4 Hours As Needed 3)  Symbicort 160-4.5 Mcg/act Aero (Budesonide-Formoterol Fumarate) .... 2 Puffs Two Times A Day With Spacer For Asthma Control 4)  Singulair 10 Mg  Tabs (Montelukast Sodium) .Marland Kitchen.. 1 Tablet By Mouth At Bedtime 5)  Albuterol Sulfate (2.5 Mg/52ml) 0.083% Nebu (Albuterol Sulfate) .Marland Kitchen.. 1 Vial Inhaled With Nebulizer Machine Every 4 Hrs As Needed - Disp 1 Box 6)  Citalopram Hydrobromide 40 Mg Tabs (Citalopram Hydrobromide) .Marland Kitchen.. 1 Tab By Mouth Every Day 7)  Chlorpromazine Hcl 25 Mg Tabs (Chlorpromazine Hcl) .... One Tab By Mouth Q6h As Needed For Nausea 8)  Aerochamber Plus  Misc (Spacer/aero-Holding Chambers) .... Please Provide Patient With 1 Spacer For Use With Her Inhalers 9)  Fluticasone Propionate 50 Mcg/act Susp (Fluticasone Propionate) .... 2 Sprays Each Nostril Daily 10)  Zomig 5 Mg Tabs (Zolmitriptan) .... As Needed Migraine  Allergies (verified): 1)  ! Septra 2)  ! Macrobid 3)  * Advair  Past History:  Past medical, surgical, family and social histories (including risk factors) reviewed, and no changes noted (except as noted below).  Past Medical History: Reviewed history from 04/05/2010 and no changes required. Missed AB 4/03 with D&C; SAB 7/06  - now [id number],  TAB in 1987 and 2004 9/09 Sleep Study - Mild Obstructive Sleep Apnea / Hypopnea Syndrome - CPAP unable to be titrated       AHI 7.2 per hour, RDI 9.6 per hour, Desaturation to 84%      Recommendations:  Conservative measures - weight loss, encouragement to sleep off flat of back      Still consider CPAP titration if these are unsuccessful PPD in December of `04 4th Metatarsal stress fx Feb 2006 h/o frequent UTIs and BV CXR - normal, borderline CM - 11/20/2005 PFTs:  mild obstruction, +bronchodilator - 12/17/2005 TV US - normal - 08/30/2003 Migraines (?vestibular)  Past Surgical History: Reviewed history from 09/20/2008 and no changes required. C/S x 4 - all deliveries D+C s/p SAB  - 06/15/2005 s/p LEEP 1994 hernia Repair, umbilical 1990   Family History: Reviewed history from 09/20/2008 and no changes required. colon cancer in grandfather and great aunt Grandmother has diabetes, HTN No FHx early MI No FHx of Breast Cancer Remote FHx of Cervical Cancer  Social History: Reviewed history from 08/31/2009 and no changes required. Single, lives in [place] alone. 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 one current sexual partner many stressors in life - finds herself in bad relationships - creates lots of drama  Review of Systems       per HPI  Physical Exam  General:  Well-developed,well-nourished,in no acute distress; alert,appropriate and cooperative throughout examination VS noted - WNL Lungs:  Normal respiratory effort, chest expands symmetrically. Lungs are clear to auscultation, no crackles or wheezes.  not taking a full deep breath when asked Heart:  Normal rate and regular rhythm. S1 and S2 normal without gallop, murmur, click, rub or other extra sounds. Extremities:  no edema Neurologic:  alert & oriented X3.   Psych:  appears stressed and anxious and at other times depressed.     Impression & Recommendations:  Problem # 1:  ASTHMA, PERSISTENT, SEVERE (ICD-493.90) Assessment Deteriorated  change flovent to symbicort to add LABA to regimen.  not adequately controlled at this time but I question her compliance with regimen also add nasal steroid to help with nasal congestion.  i wonder if her fatigue is related to her incraesed WOB from asthma being inadequately controlled  FMLA paperwork completed  Her updated medication list for this problem includes:    Ventolin Hfa 108 (90 Base) Mcg/act Aers (Albuterol sulfate) .Marland Kitchen... 2 puffs inhaled every 4 hours as needed    Symbicort  160-4.5 Mcg/act Aero (Budesonide-formoterol fumarate) .Marland Kitchen... 2 puffs two times a day with spacer for asthma control    Singulair 10 Mg Tabs (Montelukast sodium) .Marland Kitchen... 1 tablet by mouth at bedtime    Albuterol Sulfate (2.5 Mg/66ml) 0.083% Nebu (Albuterol sulfate) .Marland Kitchen... 1 vial inhaled with nebulizer machine every 4 hrs as needed - disp 1 box  Orders: [facility]- Est  Level 4 (09811) Form Completion (91478)  Problem # 2:  FATIGUE (ICD-780.79) Assessment: New CBC and Bmet WNL fairly recently but TSH hasn't been checked recently. ? if related to poorly controlled asthma ? if related to poor sleep from mild OSA ? if related to mood disorder  see if improves with improved asthma control, if not would consider looking further into mood disorder.   check TSH to make  sure thyroid isn't an issue as well.  Orders: [facility] ([address]) [facility]- Est  Level 4 (09811)  Problem # 3:  RHINITIS, ALLERGIC (ICD-477.9) Assessment: Unchanged  added flonase   Her updated medication list for this problem includes:    Allegra 180 Mg Tabs (Fexofenadine hcl) .Marland Kitchen... Take 1 tablet by mouth every night    Fluticasone Propionate 50 Mcg/act Susp (Fluticasone propionate) .Marland Kitchen... 2 sprays each nostril daily  Orders: [facility]- Est  Level 4 (99214)  Problem # 4:  MIGRAINE, UNSPEC., W/O INTRACTABLE MIGRAINE (ICD-346.90) Assessment: Unchanged  FMLA paperwork completed  Her updated medication list for this problem includes:    Zomig 5 Mg Tabs (Zolmitriptan) .Marland Kitchen... As needed migraine  Orders: [facility]- Est  Level 4 (91478) Form Completion (29562)  Complete Medication List: 1)  Allegra 180 Mg Tabs (Fexofenadine hcl) .... Take 1 tablet by mouth every night 2)  Ventolin Hfa 108 (90 Base) Mcg/act Aers (Albuterol sulfate) .... 2 puffs inhaled every 4 hours as needed 3)  Symbicort 160-4.5 Mcg/act Aero (Budesonide-formoterol fumarate) .... 2 puffs two times a day with spacer for asthma control 4)  Singulair 10 Mg Tabs (Montelukast sodium)  .Marland Kitchen.. 1 tablet by mouth at bedtime 5)  Albuterol Sulfate (2.5 Mg/70ml) 0.083% Nebu (Albuterol sulfate) .Marland Kitchen.. 1 vial inhaled with nebulizer machine every 4 hrs as needed - disp 1 box 6)  Citalopram Hydrobromide 40 Mg Tabs (Citalopram hydrobromide) .Marland Kitchen.. 1 tab by mouth every day 7)  Chlorpromazine Hcl 25 Mg Tabs (Chlorpromazine hcl) .... One tab by mouth q6h as needed for nausea 8)  Aerochamber Plus Misc (Spacer/aero-holding chambers) .... Please provide patient with 1 spacer for use with her inhalers 9)  Fluticasone Propionate 50 Mcg/act Susp (Fluticasone propionate) .... 2 sprays each nostril daily 10)  Zomig 5 Mg Tabs (Zolmitriptan) .... As needed migraine  Patient Instructions: 1)  Stop your flovent and start taking symbicort instead - use it every day to try to keep your asthma under better control.  2)  Use a spacer with your inhalers. 3)  Be sure to take all of your medicines every day. 4)  Use the nose spray every day even if right now it doesn't seem to help much - it can take up to 1 month before we see full effect and you may rest better if you can breathe more easily. 5)  Please follow up in 1 month for your asthma and fatigue.  Prescriptions: FLUTICASONE PROPIONATE 50 MCG/ACT SUSP (FLUTICASONE PROPIONATE) 2 sprays each nostril daily  #1 x 6   Entered and Authorized by:   Ancil Boozer  MD   Signed by:   Ancil Boozer  MD on 05/11/2010   Method used:   Electronically to        [facility]* (retail)       [address]       [address] Shipping/mailing       [place], [place]  [address]       Ph: [phone]       Fax: [phone]   RxID:   [id number] AEROCHAMBER PLUS  MISC (SPACER/AERO-HOLDING CHAMBERS) please provide patient with 1 spacer for use with her inhalers  #1 x 0   Entered and Authorized by:   Ancil Boozer  MD   Signed by:   Ancil Boozer  MD on 05/11/2010   Method used:   Electronically to        [facility]* (retail)  [address]       [address] Shipping/mailing       [place], [place]  [address]       Ph: [phone]       Fax: [phone]   RxID:   [id number] SYMBICORT 160-4.5 MCG/ACT AERO (BUDESONIDE-FORMOTEROL FUMARATE) 2 puffs two times a day with spacer for asthma control  #1 x 3   Entered and Authorized by:   Ancil Boozer  MD   Signed by:   Ancil Boozer  MD on 05/11/2010   Method used:   Electronically to        [facility]* (retail)       [address]       [address] Shipping/mailing       [place], [place]  [address]       Ph: [phone]       Fax: [phone]   RxID:   [id number]

## 2010-11-28 NOTE — Initial Assessments (Signed)
Summary: Hospital H+P: Chest Pain    Primary Provider:  Edd Arbour   History of Present Illness: History and Physical CC: Chest Pain HPI: [name] is a 45 yo female who presents with 3 days of chest pain.  She says she has pain in the middle of her chest and it feels like someone is sitting on her chest.  She says she was cooking in the kitchen when it started.  She says that it started radiating to her left arm and her back, and her neck is hurting too.  She says that getting anxious about the chest pain makes it worse, but cannot name anything that made it better.  She says that she recieved Nitro in the ED, and the chest pain has eased off a little, but now she has a headache.   Last night, the pt. felt sweaty and clammy, nautious, and also felt some palpitations on top of the chest pain.  She says she has a history of palpitations which her cardiologist perscribed Verpamil, and she took one last night.  The pt. said she thought the pain may be related to her Asthma, so she has taken several breathing treatments over the past few days.    On further questioning, the patient says she has been having mild chest pain for about two weeks, but it always went away, so she did not seek medical care.  She says she has had some syptoms of her GERD along with her other symptoms.  She does not associate the chest pain with eating.  [name] says she has had problems with anxiety in the past, particularly prior to being diagnosed and treated for Asthma.  She says that her job working in the [facility] ED has been stressful lately.    Allergies: 1)  ! Septra 2)  ! Macrobid 3)  * Advair  Family History: colon cancer in grandfather and great aunt Grandmother has diabetes, HTN Father has had 2 MI's, first at age 77.  No FHx of Breast Cancer Remote FHx of Cervical Cancer  Social History: Single, lives in [place] with fiance. has 4 sons.  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 one current sexual partner  Review of Systems  The patient denies fever, syncope, headaches, abdominal pain, muscle weakness, and difficulty walking.    Physical Exam  General:  Well-developed,well-nourished,in no acute distress; alert,appropriate and cooperative throughout examination Head:  Normocephalic and atraumatic without obvious abnormalities. No apparent alopecia or balding. Eyes:  EOMIT Neck:  No tenderness to palpation.  Full ROM.  Chest Wall:  no tenderness to palpation Lungs:  Normal respiratory effort, chest expands symmetrically. Lungs are clear to auscultation, no crackles or wheezes. Heart:  Normal rate and regular rhythm. S1 and S2 normal without gallop, murmur, click, rub or other extra sounds. Abdomen:  Bowel sounds positive,abdomen soft and non-tender without masses, organomegaly or hernias noted. Pulses:  R and L radial, dorsalis pedis and posterior tibial pulses are full and equal bilaterally Extremities:  No clubbing, cyanosis, edema, or deformity noted with normal full range of motion of all joints.   Skin:  Intact without suspicious lesions or rashes Psych:  Oriented X3, memory intact for recent and remote, and slightly anxious.     Impression & Recommendations:  Problem # 1:  CHEST PAIN (ICD-786.50) Pain atypical, but we cannot  rule out cardiac etiology of this pain. Admit to telemetry bed for rule out.  EKG NSR with inverted T waves that were stable from previous.  Initial Cardiac Enzymes were negative.  Will cycle enzymes and get Hg A1c, Fasting Lipid Panel, TSH to stratify cardiac risk factors.  Repeat EKG in the am.  Tylenol 500 mg Po Q4 as needed for pain, and Morphine 2mg  IV q3h as needed for breakthrough pain.  Will reccomend Exercise stress test after hospitilization if enzymes negative.   Problem # 2:  GASTROESOPHAGEAL REFLUX, NO ESOPHAGITIS (ICD-530.81) Could be  cause of symptoms.  Will give GI cocktail x1, and give protonix 40 mg by mouth daily.  Zofran 4 mg by mouth Q4 as needed for nausea.   Problem # 3:  ASTHMA, PERSISTENT, SEVERE (ICD-493.90) Could contribute to chest pain as well.  We will continue her home medications for Asthma:     Flonase 50 micrograms 2 sprays each nostril daily.  Allegra 180 mg by mouth at bedtime.     Ventolin Hfa 108 (90 Base) Mcg/act Aers (Albuterol sulfate) .Marland Kitchen... 2 puffs inhaled every 4 hours as needed    Symbicort 160-4.5 Mcg/act Aero (Budesonide-formoterol fumarate) .Marland Kitchen... 2 puffs two times a day with spacer for asthma control    Singulair 10 Mg Tabs (Montelukast sodium) .Marland Kitchen... 1 tablet by mouth at bedtime    FEN/GI: SLIV, Heart healthy diet PPX: Heparin 500 units Subcutaneously three times a day.   Complete Medication List: 1)  Allegra 180 Mg Tabs (Fexofenadine hcl) .... Take 1 tablet by mouth every night 2)  Ventolin Hfa 108 (90 Base) Mcg/act Aers (Albuterol sulfate) .... 2 puffs inhaled every 4 hours as needed 3)  Symbicort 160-4.5 Mcg/act Aero (Budesonide-formoterol fumarate) .... 2 puffs two times a day with spacer for asthma control 4)  Singulair 10 Mg Tabs (Montelukast sodium) .Marland Kitchen.. 1 tablet by mouth at bedtime 5)  Albuterol Sulfate (2.5 Mg/34ml) 0.083% Nebu (Albuterol sulfate) .Marland Kitchen.. 1 vial inhaled with nebulizer machine every 4 hrs as needed - disp 1 box 6)  Citalopram Hydrobromide 40 Mg Tabs (Citalopram hydrobromide) .Marland Kitchen.. 1 tab by mouth every day 7)  Chlorpromazine Hcl 25 Mg Tabs (Chlorpromazine hcl) .... One tab by mouth q6h as needed for nausea 8)  Aerochamber Plus Misc (Spacer/aero-holding chambers) .... Please provide patient with 1 spacer for use with her inhalers 9)  Fluticasone Propionate 50 Mcg/act Susp (Fluticasone propionate) .... 2 sprays each nostril daily 10)  Zomig 5 Mg Tabs (Zolmitriptan) .... As needed migraine  Appended Document:  Vitals: T. 98.6, P 66, BP 123/82, RR 18, POx 98% RA  Labs:  Chem 8:  TCO2                                     25                0-100            mmol/L Ionized Calcium                          1.05       l      1.12-1.32        mmol/L Hemoglobin (HGB)                         13.3  12.0-15.0        g/dL Hematocrit (HCT)                         39.0              36.0-46.0        % Sodium (NA)                              140               135-145          mEq/L Potassium (K)                            3.5               3.5-5.1          mEq/L Chloride                                 106               96-112           mEq/L Glucose                                  80                70-99            mg/dL BUN                                      9                 6-23             mg/dL Creatinine                               1.0      Cardiac Enzymes:  CKMB, POC                                2.1               1.0-8.0          ng/mL  Troponin I, POC                          <0.05             0.00-0.09        ng/mL  Myoglobin, POC                           71.6              12-200           ng/mL  CBC: WBC  4.9               4.0-10.5         K/uL  RBC                                      3.90              3.87-5.11        MIL/uL  Hemoglobin (HGB)                         12.2              12.0-15.0        g/dL  Hematocrit (HCT)                         36.0              36.0-46.0        %  MCV                                      92.3              78.0-100.0       fL  MCH -                                    31.3              26.0-34.0        pg  MCHC                                     33.9              30.0-36.0        g/dL  RDW                                      12.9              11.5-15.5        %  Platelet Count (PLT)                     306               150-400          K/uL  Neutrophils, %                           53                43-77            %  Lymphocytes, %                           34                 12-46            %  Monocytes, %                             11                3-12             %  Eosinophils, %                           2                 0-5              %  Basophils, %                             1                 0-1              %  Neutrophils, Absolute                    2.6               1.7-7.7          K/uL  Lymphocytes, Absolute                    1.7               0.7-4.0          K/uL  Monocytes, Absolute                      0.5               0.1-1.0          K/uL  Eosinophils, Absolute                    0.1               0.0-0.7          K/uL  Basophils, Absolute                      0.0               0.0-0.1          K/uL  UA:  Color, Urine                             YELLOW            YELLOW  Appearance                               CLEAR             CLEAR  Specific Gravity                         1.012             1.005-1.030  pH  6.5               5.0-8.0  Urine Glucose                            NEGATIVE          NEG              mg/dL  Bilirubin                                NEGATIVE          NEG  Ketones                                  NEGATIVE          NEG              mg/dL  Blood                                    NEGATIVE          NEG  Protein                                  NEGATIVE          NEG              mg/dL  Urobilinogen                             1.0               0.0-1.0          mg/dL  Nitrite                                  NEGATIVE          NEG  Leukocytes                               NEGATIVE          NEG  Appended Document:  06/02/10           07:00       FPTS PGY3 H+P Addendum  PCP: Dr. Edd Arbour [facility])  Cardiology: Dr. Elease Hashimoto [facility])   CC: Chest pain   HPI:  Briefly, this is a 45 year old female PMH significant for depression / anxiety, persistent severe asthma, migraine, "palpitations" who presents with complaints of 2 weeks of intermittent  substernal chest pain and pressure which because worse one day prior to admission. Pain began when she was standing up cooking two weeks ago; her pain has never been associated with exertion or relieved by rest, radiates to her left arm and neck. Pain is not exacerbated by motion or position or foods and does not change with time of day. No specific trigger of pain. Pain is slightly improved with NTG in the ER; going to sleep helped it go away somewhat. She reports nausea, diaphoresis, anxiety, dyspnea with her symptoms yesterday as well as  some "palpitations" ("her heart was beating faster and harder" without skipping beats) She reports a prior history of palpitations for which she was seen by Cardiology and given Verapamil as needed - this did not help. She reports some increased stress over the past few weeks - she did not elaborate on this further. Reports some substernal "burning" as well "like heartburn" yesterday. Denies vision change, focal neurological signs, emesis, diarrhea, melena, hematochezia, wheezing, abdominal pain.    See PGY1 H+P for PMH, past surgical history, medications, allergies, family history, social history, vitals   Physical Exam:    General:  overweight, somewhat anxious appearing, NAD  Head: NCAT  Eyes: PERRL, EOMI Mouth: Moist membranes, no erythema or exudate  Pulmonary: CTAB w/o wheeze or crackles, regular rate and rhythm  Cardiovascular/Chest: RRR, no murmurs or gallops, 2+ radials, 2+ pedals, no JVD or carotid bruits, non-tender to palpation over sternum, no peripheral edema  Abdomen:  +BS, no masses, nom tender to palpation, no rebound or guarding Neuro:  A+O x3, CN II - XII intact, cerebellar intact  MSK: 5/5 strength bilateral upper and lower extremities     Labs:   Na: 140, K: 3.5, Cl: 106, BUN 9, Cr 1.0, glucose: 80 CBC wnl POC CE - ve x 1 set UA unremarkable  EKG: Normal sinus rhythm, rate 64, normal intervals and axis, no ischemic / infarct ST or T  wave changes   Studies: CXR: Mild cardiac enlargement. Bibasilar atelectasis. Minimal chronic peribronchial thickening. Lungs otherwise clear.     Assessment and plan:  24 F w/ atypical chest pain,   1)   Chest pain (atypical): Substernal, non-exertional, not relieved by rest (partially relieved by NTG). TIMI score = 0 (very low risk all-cause mortality, MI in 14 days). Given the fact that patient is still reporting moderate to severe pain, will admit for 23 hour observation, rule out with cardiac enzymes x 2 sets (though given duration of symptoms would expect POC CE to have been positive). Will place on telemetry given reported history of palpitations (though normal exam at this time. Will risk stratify with check FLP, A1C. Will check TSH. EKG as above unchanged from prior. Would benefit from outpatient treadmill test. Will use Tylenol for pain with morphine for breakthrough. At top of differential would be anxiety vs. GI related symptoms. Patient with reported similar symptoms when anxious, also reports increased stress recently. Will continue home dose citalopram. Patient is on amitriptyline as well but is unsure of dose. Will hold this for now until dose is determined. Will treat with GI cocktail now, start Protonix. Patient on PPI at home, reports symptoms of GERD . PE unlikely as source of pain (no risk factors),  no increased O2 requirement or dyspnea or tachypnea. CXR as above w/o signs of infection. History of asthma likely unrelated to above symptoms given normal exam and vitals 2)   Depression / anxiety: Likely contributing / cause of #1. Continue home dose citalopram. Restart  amitriptyline when home dose becomes known.  3)   Asthma: Stable at this time. Continue home medications.  4)   FEN/GI: Heart healthy, diet. Saline lock IV. Lytes wnl.  5)   PPX: Heparin TID for DVT ppx.  6)   Disposition: Pending work up and improvement in #1.  Bobby Rumpf MD                                                                                                                                                                                   (Please page on call FPTS resident with questions)

## 2010-11-28 NOTE — Assessment & Plan Note (Signed)
Summary: f/u 5/17 visit/per breen/eo   Vital Signs:  Patient profile:   45 year old female Weight:      160.9 pounds Temp:     98.1 degrees F oral Pulse rate:   76 / minute BP sitting:   119 / 70  (left arm) Cuff size:   large  Vitals Entered By: Loralee Pacas CMA (Mar 15, 2010 9:06 AM) CC: follow-up visit Is Patient Diabetic? No Pain Assessment Patient in pain? yes     Location: abdomen Intensity: 4   Primary Care Provider:  Lequita Asal  MD  CC:  follow-up visit.  History of Present Illness: follow-up visit for abdominal pain: patient with hpi from Dr. Mauricio Po on day prior to patient encounter on the 18th.  on presentation, pt still with abdominal pain that does not feel as bad as day prior to coming to clinic.  pt pain level 2-3/10.  pt using naproxsyn for pain.  GERD within normal limits.   Habits & Providers  Alcohol-Tobacco-Diet     Tobacco Status: never  Allergies: 1)  ! Septra 2)  ! Macrobid  Physical Exam  General:  NAD, well-developed.   vital signs noted and wnl Mouth:  pharynx pink and moist and no exudates.   Abdomen:  hypoactive bowel sounds, soft, no distention, no guarding, no rigidity, and no rebound tenderness.  some persistent tenderness to palpation of RLQ.     Impression & Recommendations:  Problem # 1:  ABDOMINAL PAIN, RIGHT LOWER QUADRANT (ICD-789.03) Patient with abdominal pain of unknown etiology.  appendicitis ruled out by CT scan. Urine pregnancy test as noted in chart.  Told patient to take Tylenol instead of Naproxsyn in light of GERD on pmhx. Orders: [facility] (81025) [facility]- Est Level  3 (99213)  Complete Medication List: 1)  Allegra 180 Mg Tabs (Fexofenadine hcl) .... Take 1 tablet by mouth every night 2)  Flonase 50 Mcg/act Susp (Fluticasone propionate) .... Spray 1 spray into both nostrils twice a day 3)  Omeprazole 20 Mg Cpdr (Omeprazole) .... Take 1 capsule by mouth once a day 4)  Ventolin Hfa 108 (90 Base) Mcg/act Aers  (Albuterol sulfate) .... 2 puffs inhaled every 4 hours as needed 5)  Zomig 5 Mg Tabs (Zolmitriptan) .... By mouth 6)  Flovent Hfa 220 Mcg/act Aero (Fluticasone propionate  hfa) .... 2 puffs two times a day 7)  Singulair 10 Mg Tabs (Montelukast sodium) .Marland Kitchen.. 1 tablet by mouth at bedtime 8)  Naprosyn 500 Mg Tabs (Naproxen) .Marland Kitchen.. 1 tab by mouth two times a day as needed for pain - take with food 9)  Promethazine Hcl 25 Mg Tabs (Promethazine hcl) .Marland Kitchen.. 1 by mouth q 6 hours as needed nausea with headache 10)  Albuterol Sulfate (2.5 Mg/90ml) 0.083% Nebu (Albuterol sulfate) .Marland Kitchen.. 1 vial inhaled with nebulizer machine every 4 hrs as needed - disp 1 box 11)  Citalopram Hydrobromide 40 Mg Tabs (Citalopram hydrobromide) .Marland Kitchen.. 1 tab by mouth every day 12)  Amitriptyline Hcl 100 Mg Tabs (Amitriptyline hcl) .Marland Kitchen.. 1 by mouth at night daily 13)  Vesicare 5 Mg Tabs (Solifenacin succinate) 14)  Tussionex Pennkinetic Er 8-10 Mg/68ml Lqcr (Chlorpheniramine-hydrocodone) .... 5ml by mouth q hs as needed cough disp 50 ml 15)  Benzonatate 100 Mg Caps (Benzonatate) .Marland Kitchen.. 1-2 caps q 6 hrs as needed cough 16)  Augmentin 875-125 Mg Tabs (Amoxicillin-pot clavulanate) .Marland Kitchen.. 1 tab by mouth two times a day for 14 days 17)  Doxycycline Hyclate 100 Mg Tabs (Doxycycline hyclate) .Marland KitchenMarland KitchenMarland Kitchen  One tablet twice a day for 7 days  Laboratory Results   Urine Tests  Date/Time Received: Mar 15, 2010 10:03 AM  Date/Time Reported: Mar 15, 2010 10:18 AM     Urine HCG: negative Comments: ...........test performed by...........Marland KitchenTerese Door, CMA

## 2010-11-28 NOTE — Letter (Signed)
Summary: Out of Work  [facility]  [address]   [place], [place] [address]   Phone: [phone]  Fax: [phone]    April 05, 2010   Employee:  [name]    To Whom It May Concern:   For Medical reasons, please excuse the above named employee from work for the following dates:  Start:    End:    If you need additional information, please feel free to contact our office.         Sincerely,    Loralee Pacas CMA

## 2010-11-28 NOTE — Assessment & Plan Note (Signed)
Summary: resch'd from 11/4 for physical/bmc   Vital Signs:  Patient profile:   45 year old female Height:      61 inches Weight:      163.19 pounds BMI:     30.95 Temp:     98.5 degrees F oral Pulse rate:   62 / minute BP sitting:   144 / 90  (right arm) Cuff size:   regular  Vitals Entered By: Jimmy Footman, CMA (September 08, 2010 2:20 PM) CC: cpe, headache x2 weeks Is Patient Diabetic? No Pain Assessment Patient in pain? no        Primary Provider:  Edd Arbour  CC:  cpe and headache x2 weeks.  History of Present Illness: 1. sleep apnea patient feels tired when she awakes. she is repeatedly awoken from sleep by her own snoring. she has visible dark bags under her eyes. she wakes up with headaches associated with her snoring. She had a sleep study in 09 which revealed mile obstructive sleep apnea. she will get a home health evaluation for a CPAP machine, and potentially another sleep eval.  2. migraines she complains of migraines, but wishes to continue her current medical management. She says that the migraines are less than before and she can usually stop them by recognizing the early warning signs.  3. female screening she has had three negative pap smears in a row. we will move her screening to every 3 years, meaning her next screening will be next annual in 2012.    Preventive Screening-Counseling & Management  Alcohol-Tobacco     Smoking Status: never  Allergies: 1)  ! Septra 2)  ! Macrobid 3)  * Advair  Social History: Smoking Status:  never  Review of Systems       see hpi  Physical Exam  General:  Well-developed,well-nourished,in no acute distress; alert,appropriate and cooperative throughout examination Eyes:  swollen and dark eyelids   Impression & Recommendations:  Problem # 1:  OBSTRUCTIVE SLEEP APNEA (ICD-327.23) Assessment Deteriorated  Orders: Home Health Referral (Home Health) [facility]- Est  Level 4 ([address]) Sleep Study (Sleep  Study)  Problem # 2:  MIGRAINE, UNSPEC., W/O INTRACTABLE MIGRAINE (ICD-346.90) Assessment: Improved  Her updated medication list for this problem includes:    Zomig 5 Mg Tabs (Zolmitriptan) .Marland Kitchen... As needed migraine  Orders: [facility]- Est  Level 4 ([address])  Problem # 3:  Preventive Health Care (ICD-V70.0) pap smear to be done next visit  Complete Medication List: 1)  Allegra 180 Mg Tabs (Fexofenadine hcl) .... Take 1 tablet by mouth every night 2)  Ventolin Hfa 108 (90 Base) Mcg/act Aers (Albuterol sulfate) .... 2 puffs inhaled every 4 hours as needed 3)  Symbicort 160-4.5 Mcg/act Aero (Budesonide-formoterol fumarate) .... 2 puffs two times a day with spacer for asthma control 4)  Singulair 10 Mg Tabs (Montelukast sodium) .Marland Kitchen.. 1 tablet by mouth at bedtime 5)  Albuterol Sulfate (2.5 Mg/26ml) 0.083% Nebu (Albuterol sulfate) .Marland Kitchen.. 1 vial inhaled with nebulizer machine every 4 hrs as needed - disp 1 box 6)  Citalopram Hydrobromide 40 Mg Tabs (Citalopram hydrobromide) .Marland Kitchen.. 1 tab by mouth every day 7)  Chlorpromazine Hcl 25 Mg Tabs (Chlorpromazine hcl) .... One tab by mouth q6h as needed for nausea 8)  Aerochamber Plus Misc (Spacer/aero-holding chambers) .... Please provide patient with 1 spacer for use with her inhalers 9)  Fluticasone Propionate 50 Mcg/act Susp (Fluticasone propionate) .... 2 sprays each nostril daily 10)  Zomig 5 Mg Tabs (Zolmitriptan) .... As needed  migraine 11)  Vesicare 10 Mg Tabs (Solifenacin succinate) .... Unknown dose for bladder 12)  Verapamil Hcl 40 Mg Tabs (Verapamil hcl) .... Unknown dose per cards as needed 13)  Buspirone Hcl 15 Mg Tabs (Buspirone hcl) .... 1/2 tab by mouth two times a day for 1 week then 1 tab by mouth two times a day until follow up 14)  Zofran Odt 4 Mg Tbdp (Ondansetron) .... Take one tab every 6 hours as needed for nausea.  Patient Instructions: 1)  Please schedule a follow-up appointment in 1 year. Prescriptions: ZOFRAN ODT 4 MG TBDP  (ONDANSETRON) take one tab every 6 hours as needed for nausea.  #30 x 3   Entered and Authorized by:   Edd Arbour   Signed by:   Edd Arbour on 09/08/2010   Method used:   Electronically to        [facility]* (retail)       [address]       [address] Shipping/mailing       [place], [place]  [address]       Ph: [phone]       Fax: [phone]   RxID:   [id number]    Orders Added: 1)  Home Health Referral [facility]] 2)  [facility]- Est  Level 4 [[address]] 3)  Sleep Study [Sleep Study]

## 2010-11-28 NOTE — Miscellaneous (Signed)
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 Frequent visits for various complaints.     Impression & Recommendations:  Problem # 1:  BLADDER SPASM (ICD-596.8) Assessment Comment Only patient was on vesicare and amitriptyline, but has not had refill so unsure of adherence to therapy. was treating for presumed interstitial cystitis.   Problem # 2:  ASTHMA, PERSISTENT, SEVERE (ICD-493.90) Assessment: Comment Only occasional flares particularly with changing of seasons.   Her updated medication list for this problem includes:    Ventolin Hfa 108 (90 Base) Mcg/act Aers (Albuterol sulfate) .Marland Kitchen... 2 puffs inhaled every 4 hours as needed    Flovent Hfa 220 Mcg/act Aero (Fluticasone propionate  hfa) .Marland Kitchen... 2 puffs two times a day    Singulair 10 Mg Tabs (Montelukast sodium) .Marland Kitchen... 1 tablet by mouth at bedtime    Albuterol Sulfate (2.5 Mg/44ml) 0.083% Nebu (Albuterol sulfate) .Marland Kitchen... 1 vial inhaled with nebulizer machine every 4 hrs as needed - disp 1 box  Problem # 3:  RHINITIS, ALLERGIC (ICD-477.9) Assessment: Comment Only  worsens going into spring.   The following medications were removed from the medication list:    Flonase 50 Mcg/act Susp (Fluticasone propionate) ..... Spray 1 spray into both nostrils twice a day    Promethazine Hcl 25 Mg Tabs (Promethazine hcl) .Marland Kitchen... 1 by mouth q 6 hours as needed nausea with headache Her updated medication list for this problem includes:    Allegra 180 Mg Tabs (Fexofenadine hcl) .Marland Kitchen... Take 1 tablet by mouth every night  Problem # 4:  GASTROESOPHAGEAL REFLUX, NO ESOPHAGITIS (ICD-530.81) patient was on omeprazole, but has not requested refill recently.   The following medications were removed from the medication list:    Omeprazole 20 Mg Cpdr (Omeprazole) .Marland Kitchen... Take 1 capsule by mouth once a day  Problem # 5:  MIGRAINE, UNSPEC., W/O INTRACTABLE MIGRAINE (ICD-346.90) Assessment: Comment Only intermittently presents for migraine when unable to  manage at home. relief with phenergan, toradol. meds removed because no recent refills.  The following medications were removed from the medication list:    Zomig 5 Mg Tabs (Zolmitriptan) ..... By mouth    Naprosyn 500 Mg Tabs (Naproxen) .Marland Kitchen... 1 tab by mouth two times a day as needed for pain - take with food  Problem # 6:  DEPRESSIVE DISORDER, NOS (ICD-311) Assessment: Comment Only possible contributor to frequent visits.   The following medications were removed from the medication list:    Amitriptyline Hcl 100 Mg Tabs (Amitriptyline hcl) .Marland Kitchen... 1 by mouth at night daily Her updated medication list for this problem includes:    Citalopram Hydrobromide 40 Mg Tabs (Citalopram hydrobromide) .Marland Kitchen... 1 tab by mouth every day  Problem # 7:  HYPERLIPIDEMIA (ICD-272.4) Assessment: Comment Only check FLP next visit.   Labs Reviewed: SGOT: 14 (09/20/2008)   SGPT: 12 (09/20/2008)  Prior 10 Yr Risk Heart Disease: 2 % (08/18/2008)   HDL:53 (09/20/2008), 63 (09/04/2007)  LDL:129 (09/20/2008), 125 (09/04/2007)  Chol:199 (09/20/2008), 213 (09/04/2007)  Trig:84 (09/20/2008), 126 (09/04/2007)    Prevention & Chronic Care Immunizations   Influenza vaccine: given  (07/28/2008)   Influenza vaccine deferral: Not available  (04/14/2010)   Influenza vaccine due: 07/28/2009    Tetanus booster: 05/29/2002: Done.   Tetanus booster due: 05/29/2012    Pneumococcal vaccine: Not documented   Pneumococcal vaccine deferral: Not indicated  (04/14/2010)  Other Screening   Pap smear: NEGATIVE FOR INTRAEPITHELIAL LESIONS OR MALIGNANCY.  (09/20/2008)   Pap smear due: 09/20/2009  Mammogram: BI-RADS CATEGORY 1:  Negative.^MM DIGITAL DIAGNOSTIC BILAT  (09/29/2009)   Mammogram due: 01/17/2009   Smoking status: never  (04/05/2010)  Lipids   Total Cholesterol: 199  (09/20/2008)   LDL: 129  (09/20/2008)   LDL Direct: Not documented   HDL: 53  (09/20/2008)   Triglycerides: 84  (09/20/2008)    SGOT (AST):  14  (09/20/2008)   SGPT (ALT): 12  (09/20/2008)   Alkaline phosphatase: 42  (09/20/2008)   Total bilirubin: 0.3  (09/20/2008)    Lipid flowsheet reviewed?: Yes   Progress toward LDL goal: At goal  Self-Management Support :   Personal Goals (by the next clinic visit) :      Personal LDL goal: 130  (04/14/2010)    Lipid self-management support: Not documented     Lipid self-management support not done because: Good outcomes  (04/14/2010)

## 2010-11-28 NOTE — Assessment & Plan Note (Signed)
Summary: dizzy,df   Vital Signs:  Patient profile:   45 year old female Weight:      160.7 pounds Temp:     98.3 degrees F oral Pulse rate:   66 / minute Pulse rhythm:   regular BP sitting:   128 / 78  (right arm) Cuff size:   large  Vitals Entered By: Loralee Pacas CMA (April 05, 2010 11:12 AM) CC: dizzy Is Patient Diabetic? No Comments pt has been feeling dizzy since yesterday, nauseated no vomiting.     Primary Care Provider:  Lequita Asal  MD  CC:  dizzy.  History of Present Illness: 52F hx migraine with one day of vertigo, N/V, with headache starting today, in back of head, feels like previous migraines, photophobia, phonophobia.  Doesnt feel like fainting, more like room spinning.  Migraines typically 3-4x a year, lasting up to 2 weeks at a time.  Used Zomig once yesterday but felt to nauseous to take her 2nd dose.  No abd pain or diarrhea.  No other complaints.  Hx BTL.  Habits & Providers  Alcohol-Tobacco-Diet     Tobacco Status: never  Current Medications (verified): 1)  Allegra 180 Mg Tabs (Fexofenadine Hcl) .... Take 1 Tablet By Mouth Every Night 2)  Flonase 50 Mcg/act Susp (Fluticasone Propionate) .... Spray 1 Spray Into Both Nostrils Twice A Day 3)  Omeprazole 20 Mg Cpdr (Omeprazole) .... Take 1 Capsule By Mouth Once A Day 4)  Ventolin Hfa 108 (90 Base) Mcg/act Aers (Albuterol Sulfate) .... 2 Puffs Inhaled Every 4 Hours As Needed 5)  Zomig 5 Mg Tabs (Zolmitriptan) .... By Mouth 6)  Flovent Hfa 220 Mcg/act  Aero (Fluticasone Propionate  Hfa) .... 2 Puffs Two Times A Day 7)  Singulair 10 Mg  Tabs (Montelukast Sodium) .Marland Kitchen.. 1 Tablet By Mouth At Bedtime 8)  Naprosyn 500 Mg  Tabs (Naproxen) .Marland Kitchen.. 1 Tab By Mouth Two Times A Day As Needed For Pain - Take With Food 9)  Promethazine Hcl 25 Mg  Tabs (Promethazine Hcl) .Marland Kitchen.. 1 By Mouth Q 6 Hours As Needed Nausea With Headache 10)  Albuterol Sulfate (2.5 Mg/41ml) 0.083% Nebu (Albuterol Sulfate) .Marland Kitchen.. 1 Vial Inhaled With  Nebulizer Machine Every 4 Hrs As Needed - Disp 1 Box 11)  Citalopram Hydrobromide 40 Mg Tabs (Citalopram Hydrobromide) .Marland Kitchen.. 1 Tab By Mouth Every Day 12)  Amitriptyline Hcl 100 Mg Tabs (Amitriptyline Hcl) .Marland Kitchen.. 1 By Mouth At Night Daily 13)  Vesicare 5 Mg Tabs (Solifenacin Succinate) 14)  Tussionex Pennkinetic Er 8-10 Mg/21ml Lqcr (Chlorpheniramine-Hydrocodone) .... 5ml By Mouth Q Hs As Needed Cough Disp 50 Ml 15)  Benzonatate 100 Mg Caps (Benzonatate) .Marland Kitchen.. 1-2 Caps Q 6 Hrs As Needed Cough 16)  Augmentin 875-125 Mg Tabs (Amoxicillin-Pot Clavulanate) .Marland Kitchen.. 1 Tab By Mouth Two Times A Day For 14 Days 17)  Doxycycline Hyclate 100 Mg Tabs (Doxycycline Hyclate) .... One Tablet Twice A Day For 7 Days 18)  Chlorpromazine Hcl 25 Mg Tabs (Chlorpromazine Hcl) .... One Tab By Mouth Q6h As Needed For Nausea  Allergies (verified): 1)  ! Septra 2)  ! Macrobid  Past History:  Past Surgical History: Last updated: 09/20/2008 C/S x 4 - all deliveries D+C s/p SAB - 06/15/2005 s/p LEEP 1994 hernia Repair, umbilical 1990   Family History: Last updated: 09/20/2008 colon cancer in grandfather and great aunt Grandmother has diabetes, HTN No FHx early MI No FHx of Breast Cancer Remote FHx of Cervical Cancer  Social History: Last updated: 08/31/2009  Single, lives in [place] alone. 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 one current sexual partner many stressors in life - finds herself in bad relationships - creates lots of drama  Past Medical History: Missed AB 4/03 with D&C; SAB 7/06  - now [id number], TAB in 1987 and 2004 9/09 Sleep Study - Mild Obstructive Sleep Apnea / Hypopnea Syndrome - CPAP unable to be titrated       AHI 7.2 per hour, RDI 9.6 per hour, Desaturation to 84%      Recommendations:  Conservative measures - weight  loss, encouragement to sleep off flat of back      Still consider CPAP titration if these are unsuccessful PPD in December of `04 4th Metatarsal stress fx Feb 2006 h/o frequent UTIs and BV CXR - normal, borderline CM - 11/20/2005 PFTs:  mild obstruction, +bronchodilator - 12/17/2005 TV US - normal - 08/30/2003 Migraines (?vestibular)  Review of Systems       See HPI  Physical Exam  General:  Well-developed,well-nourished,in no acute distress; alert,appropriate and cooperative throughout examination Eyes:  No corneal or conjunctival inflammation noted. EOMI. Perrla. Ears:  External ear exam shows no significant lesions or deformities.  Neck:  No deformities, masses, or tenderness noted. Lungs:  Normal respiratory effort, chest expands symmetrically. Lungs are clear to auscultation, no crackles or wheezes. Heart:  Normal rate and regular rhythm. S1 and S2 normal without gallop, murmur, click, rub or other extra sounds. Abdomen:  Bowel sounds positive,abdomen soft and non-tender without masses, organomegaly or hernias noted. Neurologic:  No cranial nerve deficits noted. Station and gait are normal. Plantar reflexes are down-going bilaterally. DTRs are symmetrical throughout. Sensory, motor and coordinative functions appear intact.  Dix Hallpike negative.   Impression & Recommendations:  Problem # 1:  MIGRAINE, UNSPEC., W/O INTRACTABLE MIGRAINE (ICD-346.90) Assessment Deteriorated Possible vestibular migraines.  Compazine/benadryl/toradol IM today, compazine to go home with.  Pt has someone to drive her.  Zomig at max dose, consider ppx (Topamax vs propranolol)...to PCP.  Her updated medication list for this problem includes:    Zomig 5 Mg Tabs (Zolmitriptan) ..... By mouth    Naprosyn 500 Mg Tabs (Naproxen) .Marland Kitchen... 1 tab by mouth two times a day as needed for pain - take with food  Orders: [facility]- Est  Level 4 (99214) Benadryl  IM or IV (J1200) Compazine Injection  (A5409) Ketorolac-Toradol 15mg  (W1191)  Complete Medication List: 1)  Allegra 180 Mg Tabs (Fexofenadine hcl) .... Take 1 tablet by mouth every night 2)  Flonase 50 Mcg/act Susp (Fluticasone propionate) .... Spray 1 spray into both nostrils twice a day 3)  Omeprazole 20 Mg Cpdr (Omeprazole) .... Take 1 capsule by mouth once a day 4)  Ventolin Hfa 108 (90 Base) Mcg/act Aers (Albuterol sulfate) .... 2 puffs inhaled every 4 hours as needed 5)  Zomig 5 Mg Tabs (Zolmitriptan) .... By mouth 6)  Flovent Hfa 220 Mcg/act Aero (Fluticasone propionate  hfa) .... 2 puffs two times a day 7)  Singulair 10 Mg Tabs (Montelukast sodium) .Marland Kitchen.. 1 tablet by mouth at bedtime 8)  Naprosyn 500 Mg Tabs (Naproxen) .Marland Kitchen.. 1 tab by mouth two times a day as needed for pain - take with food 9)  Promethazine Hcl 25 Mg Tabs (Promethazine hcl) .Marland Kitchen.. 1 by  mouth q 6 hours as needed nausea with headache 10)  Albuterol Sulfate (2.5 Mg/21ml) 0.083% Nebu (Albuterol sulfate) .Marland Kitchen.. 1 vial inhaled with nebulizer machine every 4 hrs as needed - disp 1 box 11)  Citalopram Hydrobromide 40 Mg Tabs (Citalopram hydrobromide) .Marland Kitchen.. 1 tab by mouth every day 12)  Amitriptyline Hcl 100 Mg Tabs (Amitriptyline hcl) .Marland Kitchen.. 1 by mouth at night daily 13)  Vesicare 5 Mg Tabs (Solifenacin succinate) 14)  Tussionex Pennkinetic Er 8-10 Mg/54ml Lqcr (Chlorpheniramine-hydrocodone) .... 5ml by mouth q hs as needed cough disp 50 ml 15)  Benzonatate 100 Mg Caps (Benzonatate) .Marland Kitchen.. 1-2 caps q 6 hrs as needed cough 16)  Augmentin 875-125 Mg Tabs (Amoxicillin-pot clavulanate) .Marland Kitchen.. 1 tab by mouth two times a day for 14 days 17)  Doxycycline Hyclate 100 Mg Tabs (Doxycycline hyclate) .... One tablet twice a day for 7 days 18)  Chlorpromazine Hcl 25 Mg Tabs (Chlorpromazine hcl) .... One tab by mouth q6h as needed for nausea  Patient Instructions: 1)  I think you are having a vestibular migraine headache. 2)  Compazine, toradol, benadryl shots. 3)  Sent script for stronger  anti-nausea medicine to the pharmacy. 4)  Come back if no better. 5)  -Dr. Karie Schwalbe. Prescriptions: CHLORPROMAZINE HCL 25 MG TABS (CHLORPROMAZINE HCL) One tab by mouth q6h as needed for nausea  #30 x 0   Entered and Authorized by:   Rodney Langton MD   Signed by:   Rodney Langton MD on 04/05/2010   Method used:   Electronically to        [facility]* (retail)       [address]       [address] Shipping/mailing       [place], [place]  [address]       Ph: [phone]       Fax: [phone]   RxID:   [id number]    Medication Administration  Injection # 1:    Medication: Benadryl  IM or IV    Diagnosis: MIGRAINE, UNSPEC., W/O INTRACTABLE MIGRAINE (ICD-346.90)    Route: IM    Site: RUOQ gluteus    Exp Date: 03/30/2011    Lot #: [id number]    Mfr: [employer]    Patient tolerated injection without complications    Given by: Loralee Pacas CMA (April 05, 2010 12:39 PM)  Injection # 2:    Medication: Compazine Injection    Diagnosis: MIGRAINE, UNSPEC., W/O INTRACTABLE MIGRAINE (ICD-346.90)    Route: IM    Site: LUOQ gluteus    Exp Date: 03/30/2011    Lot #: [id number]    Mfr: [email or web address]    Comments: pt given 4ml    Patient tolerated injection without complications    Given by: Loralee Pacas CMA (April 05, 2010 12:41 PM)  Injection # 3:    Medication: Ketorolac-Toradol 15mg     Diagnosis: MIGRAINE, UNSPEC., W/O INTRACTABLE MIGRAINE (ICD-346.90)    Route: IM    Site: RUOQ gluteus    Exp Date: 12/28/2011    Lot #: [id number]    Mfr: hospira    Patient tolerated injection without complications    Given by: Loralee Pacas CMA (April 05, 2010 12:43 PM)  Orders Added: 1)  [facility]- Est  Level 4 [41660] 2)  Benadryl  IM or IV [J1200] 3)  Compazine Injection [J0780] 4)  Ketorolac-Toradol 15mg  [J1885]

## 2010-11-28 NOTE — Assessment & Plan Note (Signed)
Summary: asthma/trouble breathing,df   Vital Signs:  Patient profile:   45 year old female Height:      61 inches Weight:      165 pounds BMI:     31.29 O2 Sat:      100 % on Room air Temp:     98.2 degrees F oral Pulse rate:   69 / minute BP sitting:   114 / 76  (left arm) Cuff size:   regular  Vitals Entered By: Tessie Fass CMA (October 31, 2009 9:35 AM)  O2 Flow:  Room air CC: asthma and difficulty breathing Is Patient Diabetic? No   Primary Care Provider:  Lequita Asal  MD  CC:  asthma and difficulty breathing.  History of Present Illness: Ms. Igoe comes in today for difficulty breathing, fatigue, cough, loss of appetite.  Ms. Heinemann was seen in early December for a sinus infection.  She was given amoxicillin and did feel better while she was taking it and maybe a week after but then started feeling sick again.  Feels like she just "can't shake" the cold or infection that she has.  Coughing really bothering her, especially at night.  Coughs so much her throat, chest, and stomach hurt.  Having green nasal drainage again as well as right facial pain.  Subjective "low fevers" off and on.  Tired all the time, tires easily with any activity.  Nothing tastes good to her even when she is hungry so she isn't eating much.  No n/v/d.  Using her inhaler 3 times a day for the coughing but isn't helping much.  Benadryl and nyquil have not helped much either.    Habits & Providers  Alcohol-Tobacco-Diet     Tobacco Status: never  Allergies: 1)  ! Septra 2)  ! Macrobid  Physical Exam  General:  doesn't appear to feel well, fatigued, NAD. vitals reviewed.  Head:  TTP over right maxillary sinus Eyes:  conjunctiva moist, mildly injected Ears:  bilatearl TM's pearly gray with good LR and landmarks Nose:  mucosa edematous and swollen.  Mouth:  oropharynx mildly erythematous with postnasal drip Lungs:  normal work of breathing, no wheezing or crackles Heart:  RRR without  murmur Abdomen:  soft, nontender, nondistended.  No HSM.  Pulses:  2+ radial pulses Skin:  no rashes Cervical Nodes:  shotty anterior and submandibular LAD.  No posterior LAD   Impression & Recommendations:  Problem # 1:  COUGH (ICD-786.2) Assessment Deteriorated Given cough, sore throat, fatigue, decreased appetite, and duration of symptoms, will check for mono.  Given age and risk factors, level of suspicion is low but symptoms fit.   Persistent viral URI also possibly (most likely) with extended duration of cough secondary to asthma.  Given exam and facial tenderness, unresolved sinusitis also possible.  Gave pt Augmentin RX to take for 14 days (if mono test negative) to see if this helps fully eradicate possible sinusitis.  Discussed viruses can linger as can a cough.  Refilled her albuterol.   Orders: [facility] (60109) [facility]- Est  Level 4 (32355)  Complete Medication List: 1)  Allegra 180 Mg Tabs (Fexofenadine hcl) .... Take 1 tablet by mouth every night 2)  Flonase 50 Mcg/act Susp (Fluticasone propionate) .... Spray 1 spray into both nostrils twice a day 3)  Omeprazole 20 Mg Cpdr (Omeprazole) .... Take 1 capsule by mouth once a day 4)  Ventolin Hfa 108 (90 Base) Mcg/act Aers (Albuterol sulfate) .... 2 puffs inhaled every 4 hours  as needed 5)  Zomig 5 Mg Tabs (Zolmitriptan) .... By mouth 6)  Flovent Hfa 220 Mcg/act Aero (Fluticasone propionate  hfa) .... 2 puffs two times a day 7)  Singulair 10 Mg Tabs (Montelukast sodium) .Marland Kitchen.. 1 tablet by mouth at bedtime 8)  Naprosyn 500 Mg Tabs (Naproxen) .Marland Kitchen.. 1 tab by mouth two times a day as needed for pain - take with food 9)  Promethazine Hcl 25 Mg Tabs (Promethazine hcl) .Marland Kitchen.. 1 by mouth q 6 hours as needed nausea with headache 10)  Albuterol Sulfate (2.5 Mg/42ml) 0.083% Nebu (Albuterol sulfate) .Marland Kitchen.. 1 vial inhaled with nebulizer machine every 4 hrs as needed - disp 1 box 11)  Citalopram Hydrobromide 40 Mg Tabs (Citalopram hydrobromide) .Marland Kitchen.. 1  tab by mouth every day 12)  Amitriptyline Hcl 100 Mg Tabs (Amitriptyline hcl) .Marland Kitchen.. 1 by mouth at night daily 13)  Vesicare 5 Mg Tabs (Solifenacin succinate) 14)  Tussionex Pennkinetic Er 8-10 Mg/7ml Lqcr (Chlorpheniramine-hydrocodone) .... 5ml by mouth q hs as needed cough disp 50 ml 15)  Benzonatate 100 Mg Caps (Benzonatate) .Marland Kitchen.. 1-2 caps q 6 hrs as needed cough 16)  Augmentin 875-125 Mg Tabs (Amoxicillin-pot clavulanate) .Marland Kitchen.. 1 tab by mouth two times a day for 14 days  Patient Instructions: 1)  Please use the tussionex at night and the benzonatate perles during the day. 2)  If the mono test is negative, go ahead and start the Augmentin.  Be sure to finish the whole course. 3)  If you are still no better then, please return to Dr. Lanier Prude.  Prescriptions: ALBUTEROL SULFATE (2.5 MG/3ML) 0.083% NEBU (ALBUTEROL SULFATE) 1 vial inhaled with nebulizer machine every 4 hrs as needed - disp 1 box  #1 x 1   Entered and Authorized by:   Ardeen Garland  MD   Signed by:   Ardeen Garland  MD on 10/31/2009   Method used:   Print then Give to Patient   RxID:   [id number] VENTOLIN HFA 108 (90 BASE) MCG/ACT AERS (ALBUTEROL SULFATE) 2 puffs inhaled every 4 hours as needed  #1 x 1   Entered and Authorized by:   Ardeen Garland  MD   Signed by:   Ardeen Garland  MD on 10/31/2009   Method used:   Print then Give to Patient   RxID:   [id number] AUGMENTIN 875-125 MG TABS (AMOXICILLIN-POT CLAVULANATE) 1 tab by mouth two times a day for 14 days  #28 x 0   Entered and Authorized by:   Ardeen Garland  MD   Signed by:   Ardeen Garland  MD on 10/31/2009   Method used:   Print then Give to Patient   RxID:   [id number] BENZONATATE 100 MG CAPS (BENZONATATE) 1-2 caps q 6 hrs as needed cough  #30 x 2   Entered and Authorized by:   Ardeen Garland  MD   Signed by:   Ardeen Garland  MD on 10/31/2009   Method used:   Print then Give to Patient   RxID:   [id number] PENNKINETIC ER 8-10 MG/5ML LQCR  (CHLORPHENIRAMINE-HYDROCODONE) 5mL by mouth q HS as needed cough disp 50 mL  #1 x 0   Entered and Authorized by:   Ardeen Garland  MD   Signed by:   Ardeen Garland  MD on 10/31/2009   Method used:   Print then Give to Patient   RxID:   [id number]   Appended Document: mono = negative    Lab Visit  Laboratory Results  Blood Tests   Date/Time Received: October 31, 2009 10:13 AM  Date/Time Reported: October 31, 2009 11:48 AM    Mono: negative Comments: ...............test performed by......Marland KitchenBonnie A. [place], MLS (ASCP)cm    Orders Today:

## 2010-11-28 NOTE — Assessment & Plan Note (Signed)
Summary: side pain/throwing up,tcb   Vital Signs:  Patient profile:   45 year old female Height:      61 inches Weight:      160 pounds BMI:     30.34 Temp:     98.2 degrees F oral Pulse rate:   79 / minute BP sitting:   119 / 84  (right arm) Cuff size:   large  Vitals Entered By: Tessie Fass CMA (Mar 14, 2010 11:38 AM) CC: RLQ abdominal pain, nausea and vomiting x 1 day Is Patient Diabetic? No Pain Assessment Patient in pain? yes     Location: abdomen Intensity: 5   Primary Care Provider:  Lequita Asal  MD  CC:  RLQ abdominal pain and nausea and vomiting x 1 day.  History of Present Illness: Patient here for an acute visit.  She reports acute RLQ abdominal pain that had its onset yesterday, was accompanied by nausea and eventually emesis (vomiting once yesterday evening and once this morning).  She felt initially that she had 'kidney pain' that then became much more focalized to the anterior RLQ.  May be some mild relief with void, but no dysuria or polyuria/  Urine mildly more concentrated than usual, withuot gross blood.  LMP started May 11, lasted 4-5 days as it usually does for her; no longer bleeding. The pain is ameliorated mildly by sitting forward and curling her legs up.  Has never had kidney stones before. No fevers/chills.  No diarrhea.     Surgical hx; s/p BTL; s/p c-section x3; s/p umbilical hernia repair.  No history of appy or ccy.    Habits & Providers  Alcohol-Tobacco-Diet     Tobacco Status: never  Current Medications (verified): 1)  Allegra 180 Mg Tabs (Fexofenadine Hcl) .... Take 1 Tablet By Mouth Every Night 2)  Flonase 50 Mcg/act Susp (Fluticasone Propionate) .... Spray 1 Spray Into Both Nostrils Twice A Day 3)  Omeprazole 20 Mg Cpdr (Omeprazole) .... Take 1 Capsule By Mouth Once A Day 4)  Ventolin Hfa 108 (90 Base) Mcg/act Aers (Albuterol Sulfate) .... 2 Puffs Inhaled Every 4 Hours As Needed 5)  Zomig 5 Mg Tabs (Zolmitriptan) .... By  Mouth 6)  Flovent Hfa 220 Mcg/act  Aero (Fluticasone Propionate  Hfa) .... 2 Puffs Two Times A Day 7)  Singulair 10 Mg  Tabs (Montelukast Sodium) .Marland Kitchen.. 1 Tablet By Mouth At Bedtime 8)  Naprosyn 500 Mg  Tabs (Naproxen) .Marland Kitchen.. 1 Tab By Mouth Two Times A Day As Needed For Pain - Take With Food 9)  Promethazine Hcl 25 Mg  Tabs (Promethazine Hcl) .Marland Kitchen.. 1 By Mouth Q 6 Hours As Needed Nausea With Headache 10)  Albuterol Sulfate (2.5 Mg/21ml) 0.083% Nebu (Albuterol Sulfate) .Marland Kitchen.. 1 Vial Inhaled With Nebulizer Machine Every 4 Hrs As Needed - Disp 1 Box 11)  Citalopram Hydrobromide 40 Mg Tabs (Citalopram Hydrobromide) .Marland Kitchen.. 1 Tab By Mouth Every Day 12)  Amitriptyline Hcl 100 Mg Tabs (Amitriptyline Hcl) .Marland Kitchen.. 1 By Mouth At Night Daily 13)  Vesicare 5 Mg Tabs (Solifenacin Succinate) 14)  Tussionex Pennkinetic Er 8-10 Mg/77ml Lqcr (Chlorpheniramine-Hydrocodone) .... 5ml By Mouth Q Hs As Needed Cough Disp 50 Ml 15)  Benzonatate 100 Mg Caps (Benzonatate) .Marland Kitchen.. 1-2 Caps Q 6 Hrs As Needed Cough 16)  Augmentin 875-125 Mg Tabs (Amoxicillin-Pot Clavulanate) .Marland Kitchen.. 1 Tab By Mouth Two Times A Day For 14 Days 17)  Doxycycline Hyclate 100 Mg Tabs (Doxycycline Hyclate) .... One Tablet Twice A Day For 7 Days  Allergies (verified): 1)  ! Septra 2)  ! Macrobid  Physical Exam  General:  Mild discomfort when stands to walk.  No apparent distress. Breathing comfortably. Mouth:  moist mucus membranes.  Neck:  neck supple.  Lungs:  clear to auscultation bilat Abdomen:  POstiive CVA tenderness on the R>    Flat, soft, audible bowel sounds. Some focal tenderness over McBurnie's point.  No rebound.  No guarding.    Impression & Recommendations:  Problem # 1:  ABDOMINAL PAIN, RIGHT LOWER QUADRANT (ICD-789.03) Patient with sudden onset RLQ pain, and tenderness on exam.  Concern for appy.  I have checked CBC, as well as BMet to establish normal renal function, patient to go for CT abd/pelvis with contrast now to evaluate for possible  Appy.  I have discussed with Dr Jennette Kettle, who will be able to receive results this afternoon to contact patient at [phone] with results.   Toradol 30mg  IM given today in office.  For follow up tomorrow, or to ER if severe worsening.  Orders: Basic Met-[facility] (11914-78295) CBC-[facility] (62130) Urinalysis-[facility] (00000) CT with Contrast (CT w/ contrast) [facility]- Est  Level 4 (86578)  Problem # 2:  ASTHMA, PERSISTENT, SEVERE (ICD-493.90) Patietn has had some mild flare in her asthma, needs refills on meds. Sent to [facility].   Her updated medication list for this problem includes:    Ventolin Hfa 108 (90 Base) Mcg/act Aers (Albuterol sulfate) .Marland Kitchen... 2 puffs inhaled every 4 hours as needed    Flovent Hfa 220 Mcg/act Aero (Fluticasone propionate  hfa) .Marland Kitchen... 2 puffs two times a day    Singulair 10 Mg Tabs (Montelukast sodium) .Marland Kitchen... 1 tablet by mouth at bedtime    Albuterol Sulfate (2.5 Mg/54ml) 0.083% Nebu (Albuterol sulfate) .Marland Kitchen... 1 vial inhaled with nebulizer machine every 4 hrs as needed - disp 1 box  Orders: [facility]- Est  Level 4 (99214)  Complete Medication List: 1)  Allegra 180 Mg Tabs (Fexofenadine hcl) .... Take 1 tablet by mouth every night 2)  Flonase 50 Mcg/act Susp (Fluticasone propionate) .... Spray 1 spray into both nostrils twice a day 3)  Omeprazole 20 Mg Cpdr (Omeprazole) .... Take 1 capsule by mouth once a day 4)  Ventolin Hfa 108 (90 Base) Mcg/act Aers (Albuterol sulfate) .... 2 puffs inhaled every 4 hours as needed 5)  Zomig 5 Mg Tabs (Zolmitriptan) .... By mouth 6)  Flovent Hfa 220 Mcg/act Aero (Fluticasone propionate  hfa) .... 2 puffs two times a day 7)  Singulair 10 Mg Tabs (Montelukast sodium) .Marland Kitchen.. 1 tablet by mouth at bedtime 8)  Naprosyn 500 Mg Tabs (Naproxen) .Marland Kitchen.. 1 tab by mouth two times a day as needed for pain - take with food 9)  Promethazine Hcl 25 Mg Tabs (Promethazine hcl) .Marland Kitchen.. 1 by mouth q 6 hours as needed nausea with headache 10)  Albuterol Sulfate (2.5 Mg/40ml)  0.083% Nebu (Albuterol sulfate) .Marland Kitchen.. 1 vial inhaled with nebulizer machine every 4 hrs as needed - disp 1 box 11)  Citalopram Hydrobromide 40 Mg Tabs (Citalopram hydrobromide) .Marland Kitchen.. 1 tab by mouth every day 12)  Amitriptyline Hcl 100 Mg Tabs (Amitriptyline hcl) .Marland Kitchen.. 1 by mouth at night daily 13)  Vesicare 5 Mg Tabs (Solifenacin succinate) 14)  Tussionex Pennkinetic Er 8-10 Mg/36ml Lqcr (Chlorpheniramine-hydrocodone) .... 5ml by mouth q hs as needed cough disp 50 ml 15)  Benzonatate 100 Mg Caps (Benzonatate) .Marland Kitchen.. 1-2 caps q 6 hrs as needed cough 16)  Augmentin 875-125 Mg Tabs (Amoxicillin-pot clavulanate) .Marland KitchenMarland KitchenMarland Kitchen  1 tab by mouth two times a day for 14 days 17)  Doxycycline Hyclate 100 Mg Tabs (Doxycycline hyclate) .... One tablet twice a day for 7 days  Patient Instructions: 1)  It was a pleasure to meet you today.  I am concerned that your pain in the right-lower abdomen may be related to your appendix.  I am ordering a CT scan of the appendix to evaluate this.  This needs to be done today, as soon after you leave here as possible.  2)  We will call you with results to [phone].  I would like you to come back for recheck tomorrow if the scan is negative.  Prescriptions: SINGULAIR 10 MG  TABS (MONTELUKAST SODIUM) 1 tablet by mouth at bedtime  #34 x 1   Entered and Authorized by:   Paula Compton MD   Signed by:   Paula Compton MD on 03/14/2010   Method used:   Electronically to        [facility]* (retail)       [address]       [address] Shipping/mailing       [place], [place]  [address]       Ph: [phone]       Fax: [phone]   RxID:   [id number] FLOVENT HFA 220 MCG/ACT  AERO (FLUTICASONE PROPIONATE  HFA) 2 puffs two times a day  #1 x 1   Entered and Authorized by:   Paula Compton MD   Signed by:   Paula Compton MD on 03/14/2010   Method used:   Electronically to        [facility]* (retail)       [address]       [address]  Shipping/mailing       [place], [place]  [address]       Ph: [phone]       Fax: [phone]   RxID:   [id number] ALLEGRA 180 MG TABS (FEXOFENADINE HCL) Take 1 tablet by mouth every night  #34 x 4   Entered and Authorized by:   Paula Compton MD   Signed by:   Paula Compton MD on 03/14/2010   Method used:   Electronically to        [facility]* (retail)       [address]       [address] Shipping/mailing       [place], [place]  [address]       Ph: [phone]       Fax: [phone]   RxID:   [id number]   Appended Document: side pain/throwing up,tcb     Allergies: 1)  ! Septra 2)  ! Macrobid   Complete Medication List: 1)  Allegra 180 Mg Tabs (Fexofenadine hcl) .... Take 1 tablet by mouth every night 2)  Flonase 50 Mcg/act Susp (Fluticasone propionate) .... Spray 1 spray into both nostrils twice a day 3)  Omeprazole 20 Mg Cpdr (Omeprazole) .... Take 1 capsule by mouth once a day 4)  Ventolin Hfa 108 (90 Base) Mcg/act Aers (Albuterol sulfate) .... 2 puffs inhaled every 4 hours as needed 5)  Zomig 5 Mg Tabs (Zolmitriptan) .... By mouth 6)  Flovent Hfa 220 Mcg/act Aero (Fluticasone propionate  hfa) .... 2 puffs two times a day 7)  Singulair 10 Mg Tabs (Montelukast sodium) .Marland Kitchen.. 1 tablet by mouth at bedtime 8)  Naprosyn 500 Mg Tabs (  Naproxen) .Marland Kitchen.. 1 tab by mouth two times a day as needed for pain - take with food 9)  Promethazine Hcl 25 Mg Tabs (Promethazine hcl) .Marland Kitchen.. 1 by mouth q 6 hours as needed nausea with headache 10)  Albuterol Sulfate (2.5 Mg/49ml) 0.083% Nebu (Albuterol sulfate) .Marland Kitchen.. 1 vial inhaled with nebulizer machine every 4 hrs as needed - disp 1 box 11)  Citalopram Hydrobromide 40 Mg Tabs (Citalopram hydrobromide) .Marland Kitchen.. 1 tab by mouth every day 12)  Amitriptyline Hcl 100 Mg Tabs (Amitriptyline hcl) .Marland Kitchen.. 1 by mouth at night daily 13)  Vesicare 5 Mg Tabs (Solifenacin succinate) 14)  Tussionex Pennkinetic Er 8-10 Mg/66ml Lqcr  (Chlorpheniramine-hydrocodone) .... 5ml by mouth q hs as needed cough disp 50 ml 15)  Benzonatate 100 Mg Caps (Benzonatate) .Marland Kitchen.. 1-2 caps q 6 hrs as needed cough 16)  Augmentin 875-125 Mg Tabs (Amoxicillin-pot clavulanate) .Marland Kitchen.. 1 tab by mouth two times a day for 14 days 17)  Doxycycline Hyclate 100 Mg Tabs (Doxycycline hyclate) .... One tablet twice a day for 7 days  Other Orders: Ketorolac-Toradol 15mg  (Z6109)    Medication Administration  Injection # 1:    Medication: Ketorolac-Toradol 15mg     Diagnosis: ABDOMINAL PAIN, RIGHT LOWER QUADRANT (ICD-789.03)    Route: IM    Site: L deltoid    Exp Date: 10/2011    Lot #: [id number]    Mfr: [email or web address]    Comments: 30mg /20ml given    Patient tolerated injection without complications    Given by: Tessie Fass CMA (Mar 14, 2010 1:27 PM)  Orders Added: 1)  Ketorolac-Toradol 15mg  [W1191]  Appended Document: side pain/throwing up,tcb  Laboratory Results   Urine Tests  Date/Time Received: Mar 14, 2010 12:13 PM  Date/Time Reported: Mar 14, 2010 2:41 PM   Routine Urinalysis   Color: yellow Appearance: Clear Glucose: negative   (Normal Range: Negative) Bilirubin: negative   (Normal Range: Negative) Ketone: negative   (Normal Range: Negative) Spec. Gravity: 1.020   (Normal Range: 1.003-1.035) Blood: negative   (Normal Range: Negative) pH: 7.0   (Normal Range: 5.0-8.0) Protein: 30   (Normal Range: Negative) Urobilinogen: 0.2   (Normal Range: 0-1) Nitrite: negative   (Normal Range: Negative) Leukocyte Esterace: negative   (Normal Range: Negative)  Urine Microscopic WBC/HPF: occ RBC/HPF: occ Bacteria/HPF: 1+ Mucous/HPF: 2+ Epithelial/HPF: 0-3 Casts/LPF: >20 hyaline and granular cast    Comments: ...............test performed by......Marland KitchenBonnie A. Swaz[place], MLS (ASCP)cm

## 2010-11-28 NOTE — Progress Notes (Signed)
Summary: Emergency Line Call  Phone Note Call from Patient Call back at Home Phone [phone]   Caller: Patient Summary of Call: Pt was driving to ER, started having chest pain, palpitations, and dizziness so turned around and decided to go home.  Called me to let me know this, I advised, go to ER, either in car or call 911.  Pt agreeable. Initial call taken by: Rodney Langton MD,  June 01, 2010 10:43 PM

## 2010-11-28 NOTE — Assessment & Plan Note (Signed)
Summary: F/U/KH   Vital Signs:  Patient profile:   45 year old female Height:      61 inches Weight:      160 pounds BMI:     30.34 Temp:     99.1 degrees F oral Pulse rate:   73 / minute BP sitting:   118 / 66  (right arm) Cuff size:   regular CC: f/u hospital chest pain, sob Is Patient Diabetic? No Pain Assessment Patient in pain? no        Primary Care Provider:  Edd Arbour  CC:  f/u hospital chest pain and sob.  History of Present Illness: f/u breathing, chest pain, SOB: s/p hospitalization. decided likely is anxiety related.  regardless has cardiology appt in 1 week to reeval.  reports she has resumed her citalopram.  she thinks it is helping some but not completely,.  she thinks much of her stress is job related.  she is in the process of trying to change jobs.  she has appled to work at short stay. recently she has felt overwhelming fatigue.  she finds herself sleeping more.  also she's had decreased appetite and felt down.  she also has been feeling a little guilty about not yet completing her GED as she had hoped to do by now.  she denies concentration or psychomotor agitation problems.  she has fallen away from her interests but plans to get back to them - she already is starting to go back to church.  she has been seeing counseling through work but is considering changing her counselor.  in addition to the above when going into work primarily she feels overwhelming fatigue with sometimes associated chest pains and palpitations.  she has as needed verapamil that helps some.  she plans to follow this up with her cardiology.  finally she reports she isn't sleeping well and finds herself snoring loudly.  she reports her husband says she sometimes does stop breathing. she reports she has had a sleep study in the past but she doesn't remember the results.   Habits & Providers  Alcohol-Tobacco-Diet     Tobacco Status: quit  Current Medications (verified): 1)  Allegra 180 Mg  Tabs (Fexofenadine Hcl) .... Take 1 Tablet By Mouth Every Night 2)  Ventolin Hfa 108 (90 Base) Mcg/act Aers (Albuterol Sulfate) .... 2 Puffs Inhaled Every 4 Hours As Needed 3)  Symbicort 160-4.5 Mcg/act Aero (Budesonide-Formoterol Fumarate) .... 2 Puffs Two Times A Day With Spacer For Asthma Control 4)  Singulair 10 Mg  Tabs (Montelukast Sodium) .Marland Kitchen.. 1 Tablet By Mouth At Bedtime 5)  Albuterol Sulfate (2.5 Mg/51ml) 0.083% Nebu (Albuterol Sulfate) .Marland Kitchen.. 1 Vial Inhaled With Nebulizer Machine Every 4 Hrs As Needed - Disp 1 Box 6)  Citalopram Hydrobromide 40 Mg Tabs (Citalopram Hydrobromide) .Marland Kitchen.. 1 Tab By Mouth Every Day 7)  Chlorpromazine Hcl 25 Mg Tabs (Chlorpromazine Hcl) .... One Tab By Mouth Q6h As Needed For Nausea 8)  Aerochamber Plus  Misc (Spacer/aero-Holding Chambers) .... Please Provide Patient With 1 Spacer For Use With Her Inhalers 9)  Fluticasone Propionate 50 Mcg/act Susp (Fluticasone Propionate) .... 2 Sprays Each Nostril Daily 10)  Zomig 5 Mg Tabs (Zolmitriptan) .... As Needed Migraine 11)  Vesicare 10 Mg Tabs (Solifenacin Succinate) .... Unknown Dose For Bladder 12)  Verapamil Hcl 40 Mg Tabs (Verapamil Hcl) .... Unknown Dose Per Cards As Needed 13)  Buspirone Hcl 15 Mg Tabs (Buspirone Hcl) .... 1/2 Tab By Mouth Two Times A Day For 1  Week Then 1 Tab By Mouth Two Times A Day Until Follow Up  Allergies (verified): 1)  ! Septra 2)  ! Macrobid 3)  * Advair  Past History:  Past medical, surgical, family and social histories (including risk factors) reviewed, and no changes noted (except as noted below).  Past Medical History: Reviewed history from 04/05/2010 and no changes required. 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 C/S x 4 - all deliveries D+C s/p SAB - 06/15/2005 s/p LEEP 1994 hernia Repair, umbilical 1990   Family History: Reviewed history from 06/02/2010 and no changes required. colon cancer in grandfather and great aunt Grandmother has diabetes, HTN Father has had 2 MI's, first at age 85.  No FHx of Breast Cancer Remote FHx of Cervical Cancer  Social History: Reviewed history from 06/02/2010 and no changes required. Single, lives in [place] with fiance. has 4 sons.  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 one current sexual partner Smoking Status:  quit  Review of Systems       per HPI  Physical Exam  General:  Well-developed,well-nourished,in no acute distress; alert,appropriate and cooperative throughout examination VS noted - anxious appearing Lungs:  Normal respiratory effort, chest expands symmetrically. Lungs are clear to auscultation, no crackles or wheezes. Heart:  Normal rate and regular rhythm. S1 and S2 normal without gallop, murmur, click, rub or other extra sounds.   Impression & Recommendations:  Problem # 1:  DEPRESSIVE DISORDER, NOS (ICD-311) Assessment Deteriorated  i suspect there is also a large component of anxiety.   add buspirone f/u in 4-6 weeks given dr Carola Rhine card to consider counseling here instead encouraged on job search.  Her updated medication list for this problem includes:    Citalopram Hydrobromide 40 Mg Tabs (Citalopram hydrobromide) .Marland Kitchen... 1  tab by mouth every day    Buspirone Hcl 15 Mg Tabs (Buspirone hcl) .Marland Kitchen... 1/2 tab by mouth two times a day for 1 week then 1 tab by mouth two times a day until follow up  Orders: [facility]- Est  Level 4 (62130)  Problem # 2:  CHEST PAIN (ICD-786.50) Assessment: Unchanged  suspect mostly due to anxiety encouraged follow up with her cardiologist as scheduled.  Orders: [facility]- Est  Level 4 (99214)  Problem # 3:  ASTHMA, PERSISTENT, SEVERE (ICD-493.90) Assessment: Improved  overall improved per patient. continue current regimen  Her updated medication list for this problem includes:    Ventolin Hfa 108 (90 Base) Mcg/act Aers (Albuterol sulfate) .Marland Kitchen... 2 puffs inhaled every 4 hours as needed    Symbicort 160-4.5 Mcg/act Aero (Budesonide-formoterol fumarate) .Marland Kitchen... 2 puffs two times a day  with spacer for asthma control    Singulair 10 Mg Tabs (Montelukast sodium) .Marland Kitchen... 1 tablet by mouth at bedtime    Albuterol Sulfate (2.5 Mg/6ml) 0.083% Nebu (Albuterol sulfate) .Marland Kitchen... 1 vial inhaled with nebulizer machine every 4 hrs as needed - disp 1 box  Orders: [facility]- Est  Level 4 (99214)  Complete Medication List: 1)  Allegra 180 Mg Tabs (Fexofenadine hcl) .... Take 1 tablet by mouth every night 2)  Ventolin Hfa 108 (90 Base) Mcg/act Aers (Albuterol sulfate) .... 2 puffs inhaled every 4 hours as needed 3)  Symbicort 160-4.5 Mcg/act Aero (Budesonide-formoterol fumarate) .... 2 puffs two times a day with spacer for asthma control 4)  Singulair 10 Mg Tabs (Montelukast sodium) .Marland Kitchen.. 1 tablet by mouth at bedtime 5)  Albuterol Sulfate (2.5 Mg/68ml) 0.083% Nebu (Albuterol sulfate) .Marland Kitchen.. 1 vial inhaled with nebulizer machine every 4 hrs as needed - disp 1 box 6)  Citalopram Hydrobromide 40 Mg Tabs (Citalopram hydrobromide) .Marland Kitchen.. 1 tab by mouth every day 7)  Chlorpromazine Hcl 25 Mg Tabs (Chlorpromazine hcl) .... One tab by mouth q6h as needed for nausea 8)  Aerochamber Plus Misc (Spacer/aero-holding chambers) .... Please  provide patient with 1 spacer for use with her inhalers 9)  Fluticasone Propionate 50 Mcg/act Susp (Fluticasone propionate) .... 2 sprays each nostril daily 10)  Zomig 5 Mg Tabs (Zolmitriptan) .... As needed migraine 11)  Vesicare 10 Mg Tabs (Solifenacin succinate) .... Unknown dose for bladder 12)  Verapamil Hcl 40 Mg Tabs (Verapamil hcl) .... Unknown dose per cards as needed 13)  Buspirone Hcl 15 Mg Tabs (Buspirone hcl) .... 1/2 tab by mouth two times a day for 1 week then 1 tab by mouth two times a day until follow up  Patient Instructions: 1)  We are starting a new medicine called buspirone.  It is purely an anxiety medication and hopefully will help you with your anxiety symptoms. 2)  Continue to use counseling as needed as this helps greatly as well. 3)  Please follow up in 4-6 weeks to see how the addition of this new medication is doing for you. 4)  If you still find you are having sleeping problems at that point we may consider a repeat sleep study. Prescriptions: BUSPIRONE HCL 15 MG TABS (BUSPIRONE HCL) 1/2 tab by mouth two times a day for 1 week then 1 tab by mouth two times a day until follow up  #60 x 1   Entered and Authorized by:   Ancil Boozer  MD   Signed by:   Ancil Boozer  MD on 06/08/2010   Method used:   Electronically to        [facility]* (retail)       [address]       [address] Shipping/mailing       [place], [place]  [address]       Ph: [phone]       Fax: [phone]   RxID:   [id number] BUSPIRONE HCL 15 MG TABS (BUSPIRONE HCL) 1/2 tab by mouth two times a day for 1 week then 1 tab by mouth two times a day until follow up  #60 x 1   Entered and Authorized by:   Ancil Boozer  MD   Signed by:   Ancil Boozer  MD on 06/08/2010   Method used:   Electronically to        [facility]* (retail)  [address]       [address] Shipping/mailing       [place], [place]  [address]       Ph: [phone]        Fax: [phone]   RxID:   [id number]

## 2010-11-28 NOTE — Letter (Signed)
Summary: FMLA  FMLA   Imported By: Knox Royalty 05/19/2010 12:16:26  _____________________________________________________________________  External Attachment:    Type:   Image     Comment:   External Document

## 2010-11-28 NOTE — Assessment & Plan Note (Signed)
Summary: FU FOOT/JW   Vital Signs:  Patient Profile:   45 Years Old Female Height:     61 inches Pulse rate:   92 / minute BP sitting:   117 / 86  Vitals Entered By: Lillia Pauls CMA (November 24, 2008 9:08 AM)                 PCP:  Drue Dun MD  Chief Complaint:  FU L 3RD AND 4TH MT STRESS FX.  History of Present Illness: Much improved foot pain on left usijng inserts with MT pads has not had to go back to cast shoe no swelling after standing or walking does get pain if she does not use inserts    Current Allergies: ! SEPTRA ! MACROBID      Physical Exam  General:     Well-developed,well-nourished,in no acute distress; alert,appropriate and cooperative throughout examination Msk:     left foot is non tender no swelling over dorsum palpation an d percussion of MT is painless 2nd toe is dispaced dorsally with bilat collapse of MT arch  walking gait is not remarkable except for upward displacement of 2nd toe    Impression & Recommendations:  Problem # 1:  STRESS FRACTURE OF THE METATARSALS (ICD-733.94) Assessment: Improved this is healed at this point  Problem # 2:  METATARSALGIA (ICD-726.70) will need to continue using MT pads in all shoes we will replace her temp insoles for workshoes and use MT pads on those she will bring back for [place] to check when she gets new shoes  can RT [facility] as needed   Complete Medication List: 1)  Allegra 180 Mg Tabs (Fexofenadine hcl) .... Take 1 tablet by mouth every night 2)  Flonase 50 Mcg/act Susp (Fluticasone propionate) .... Spray 1 spray into both nostrils twice a day 3)  Omeprazole 20 Mg Cpdr (Omeprazole) .... Take 1 capsule by mouth once a day 4)  Ventolin Hfa 108 (90 Base) Mcg/act Aers (Albuterol sulfate) .... 2 puffs inhaled every 4 hours as needed 5)  Zomig 5 Mg Tabs (Zolmitriptan) .... By mouth 6)  Flovent Hfa 220 Mcg/act Aero (Fluticasone propionate  hfa) .Marland Kitchen.. 1 puff two times a day 7)  Singulair 10 Mg  Tabs (Montelukast sodium) .Marland Kitchen.. 1 tablet by mouth at bedtime 8)  Naprosyn 500 Mg Tabs (Naproxen) .Marland Kitchen.. 1 tab by mouth two times a day as needed for pain - take with food 9)  Promethazine Hcl 25 Mg Tabs (Promethazine hcl) .Marland Kitchen.. 1 by mouth q 6 hours as needed nausea with headache 10)  Flexeril 10 Mg Tabs (Cyclobenzaprine hcl) .Marland Kitchen.. 1 tablet by mouth qhs 11)  Tramadol Hcl 50 Mg Tabs (Tramadol hcl) .Marland Kitchen.. 1 tablet by mouth three times a day as needed for pain 12)  Vicodin 5-500 Mg Tabs (Hydrocodone-acetaminophen) .... One-two every 6 hrs as needed for pain 13)  Albuterol Sulfate (2.5 Mg/26ml) 0.083% Nebu (Albuterol sulfate) .Marland Kitchen.. 1 vial inhaled with nebulizer machine every 4 hrs as needed - disp 1 box 14)  Triamcinolone Acetonide 0.1 % Oint (Triamcinolone acetonide) .... Apply to affected area once daily and taper as needed.

## 2010-11-28 NOTE — Progress Notes (Signed)
  Phone Note Call from Patient   Caller: Patient Call For: [phone] Summary of Call: Calling regarding sleep study referral.  Haven't heard anything yet and want to know when she is suppose to go.  Please call her ASAP. Initial call taken by: Abundio Miu,  October 03, 2010 8:42 AM  Follow-up for Phone Call        informed pt that the sleep study lsb will call her about her appt Follow-up by: Loralee Pacas CMA,  October 03, 2010 5:14 PM

## 2010-11-28 NOTE — Letter (Signed)
Summary: Out of Work  [facility]  [address]   [place], [place] [address]   Phone: [phone]  Fax: [phone]    April 05, 2010   Employee:  [name]    To Whom It May Concern:   For Medical reasons, please excuse the above named employee from work for the following dates:  Start:   April 04, 2010    End:   April 04, 2010  If you need additional information, please feel free to contact our office.         Sincerely,    Loralee Pacas CMA

## 2010-11-28 NOTE — Progress Notes (Signed)
Summary: triage  Phone Note Call from Patient Call back at [phone]   Caller: Patient Summary of Call: Pt has boil on shoulder and it is hurting really bad is there anyway she can be seen today? Initial call taken by: Clydell Hakim,  November 29, 2009 2:27 PM  Follow-up for Phone Call        lm Follow-up by: Golden Circle RN,  November 29, 2009 2:31 PM  Additional Follow-up for Phone Call Additional follow up Details #1::        she will be here by 3pm Additional Follow-up by: Golden Circle RN,  November 29, 2009 2:37 PM

## 2010-11-28 NOTE — Letter (Signed)
Summary: Out of Work  [facility]  [address]   [place], [place] [address]   Phone: [phone]  Fax: [phone]    Mar 15, 2010   Employee:  [name]    To Whom It May Concern:   For Medical reasons, please excuse the above named employee from work for the following dates:  Start:    End:    If you need additional information, please feel free to contact our office.         Sincerely,    Starleen Blue RN
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
## 2010-11-28 NOTE — Letter (Signed)
Summary: Out of Work  [facility]  [address]   [place], [place] [address]   Phone: [phone]  Fax: [phone]    Mar 15, 2010   Employee:  [name]    To Whom It May Concern:   For Medical reasons, please excuse the above named employee from work for the following dates:  Start:   Please excuse from work 5/17 and 03/15/2010.  Was in our office today 03/15/2010  End:    If you need additional information, please feel free to contact our office.         Sincerely,    Starleen Blue RN/Verietta Mayford Knife MD
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
## 2010-11-30 NOTE — Consult Note (Signed)
Summary: [facility]  [facility]   Imported By: Bradly Bienenstock 11/07/2010 10:22:30  _____________________________________________________________________  External Attachment:    Type:   Image     Comment:   External Document

## 2010-11-30 NOTE — Letter (Signed)
Summary: Generic Letter  [facility]  [address]   [place], [place] [address]   Phone: [phone]  Fax: [phone]    10/24/2010 MRN: [id number]  [address] [place], [place]  [address]  Dear Ms. Wilson,   Your Mammogram results have come back. They are normal.  Please follow up in one year for another mammogram, or come see [place] sooner if the need arises,   Sincerely,   Edd Arbour MD [facility]  Appended Document: Generic Letter mailed

## 2010-11-30 NOTE — Progress Notes (Signed)
Summary: ROI  ROI   Imported By: De Nurse 11/09/2010 16:37:35  _____________________________________________________________________  External Attachment:    Type:   Image     Comment:   External Document
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
## 2010-11-30 NOTE — Miscellaneous (Signed)
Summary: Orders Update  Clinical Lists Changes  Orders: Added new Test order of [facility]- Est Level  3 (99213) - Signed  

## 2010-12-14 ENCOUNTER — Ambulatory Visit: Payer: Self-pay | Admitting: Family Medicine

## 2010-12-15 ENCOUNTER — Encounter: Payer: Self-pay | Admitting: Sports Medicine

## 2010-12-15 ENCOUNTER — Ambulatory Visit (INDEPENDENT_AMBULATORY_CARE_PROVIDER_SITE_OTHER): Payer: Commercial Managed Care - PPO | Admitting: Sports Medicine

## 2010-12-15 VITALS — BP 161/99 | HR 71 | Temp 98.2°F | Wt 164.8 lb

## 2010-12-15 DIAGNOSIS — N3946 Mixed incontinence: Secondary | ICD-10-CM

## 2010-12-15 LAB — POCT UA - MICROSCOPIC ONLY

## 2010-12-15 LAB — POCT URINALYSIS DIPSTICK
Bilirubin, UA: NEGATIVE
Glucose, UA: NEGATIVE
Ketones, UA: NEGATIVE
Leukocytes, UA: NEGATIVE
Nitrite, UA: NEGATIVE
pH, UA: 5.5

## 2010-12-15 MED ORDER — SOLIFENACIN SUCCINATE 10 MG PO TABS: 10.0000 mg | ORAL_TABLET | Freq: Every day | ORAL | Status: DC

## 2010-12-15 NOTE — Telephone Encounter (Signed)
Kendra Ha,  Ms. Verbeek is wanting to know when her prescription for her C-pap machine will be ready.  She had her sleep study done back in January .  Please call this pt or put the order in for this equipment so that she can get what she needs ASAP!!! Thanks

## 2010-12-15 NOTE — Patient Instructions (Signed)
Great to see you,  Refilled your vesicare. See handouts on Kegel Exercises.  Come back as needed.  -Dr. Karie Schwalbe.

## 2010-12-15 NOTE — Assessment & Plan Note (Addendum)
I spent 25 mins with this pt, >50% was face to face counselling. Taught Kegel exercises. Will also Rx vesicare. UA unremarkable (some blood but currently menstruating) RTC prn.

## 2010-12-15 NOTE — Progress Notes (Signed)
  Subjective:    Patient ID: [name], female    DOB: 1966/08/31, 45 y.o.   MRN: [id number]  HPI Pt with c/o urinary incontinence.  For the past week she gets uncontrollable urges to void.  Sometimes cant make it to toilet in time.  She has cut back fluids, stopped drinking coffee to no avail.  She is very distraught by these symptoms.  She also occasionally loses some urine when coughing/laughing/sneezing.  She has been to the urologist and was dx urge incontinence, was on vesicare and this helped but ran out and hasn't taken it in about 6 months.  No fevers/chills/dysuria.  No back pain or injury.  No LE weakness/numbness.   Review of Systems    See HPI Objective:   Physical Exam  Constitutional: She appears well-developed and well-nourished. No distress.  Musculoskeletal:       Strength 5/5 in hips, knees, ankles. DTR's 2+, no clonus. Sensation grossly intact.           Assessment & Plan:

## 2010-12-17 ENCOUNTER — Other Ambulatory Visit: Payer: Self-pay | Admitting: Family Medicine

## 2010-12-17 DIAGNOSIS — G4733 Obstructive sleep apnea (adult) (pediatric): Secondary | ICD-10-CM

## 2010-12-17 NOTE — Progress Notes (Signed)
Patient needs CPAP machine.

## 2010-12-19 ENCOUNTER — Encounter: Payer: Self-pay | Admitting: Family Medicine

## 2010-12-19 NOTE — Progress Notes (Signed)
Order faxed to [facility] [phone].Loralee Pacas [place]

## 2010-12-21 ENCOUNTER — Telehealth: Payer: Self-pay | Admitting: Family Medicine

## 2010-12-21 NOTE — Telephone Encounter (Signed)
[name] calling to say she need her CPAP machine ASAP.  Called to [facility] and they told her they didin't have any order for one for her.  She want you to call her and let her know which company she is to pick up her machine from and she need to know this now.

## 2010-12-25 NOTE — Telephone Encounter (Signed)
This order was faxed to [facility] on 02.21.2012. I resent the fax to [facility] [phone]). Informed pt that the order has been sent again and if she should have any problems or questions to please call me back.Loralee Pacas Taft

## 2010-12-25 NOTE — Telephone Encounter (Signed)
I dont know the answer to her question.  Perhaps you can route this to someone who does.

## 2010-12-25 NOTE — Telephone Encounter (Signed)
I put the referral and order in for CPAP - the information she is requesting I have no idea about.  Thanks.

## 2010-12-25 NOTE — Telephone Encounter (Signed)
Would you call her back and let her know what the situation is regarding this please.

## 2010-12-25 NOTE — Telephone Encounter (Signed)
[name],  Could you check on this for me.  Dr. Rivka Safer doesn't seem to know the answer for this and Ms. Loomer was waiting for you to or him to let her know where she is to pick up her CPAP machine.

## 2011-01-12 LAB — URINALYSIS, ROUTINE W REFLEX MICROSCOPIC
Bilirubin Urine: NEGATIVE
Ketones, ur: NEGATIVE mg/dL
Nitrite: NEGATIVE
Specific Gravity, Urine: 1.012 (ref 1.005–1.030)
Urobilinogen, UA: 1 mg/dL (ref 0.0–1.0)

## 2011-01-12 LAB — POCT CARDIAC MARKERS: Myoglobin, poc: 71.6 ng/mL (ref 12–200)

## 2011-01-12 LAB — CBC
HCT: 36 % (ref 36.0–46.0)
Hemoglobin: 12.2 g/dL (ref 12.0–15.0)
MCH: 31.3 pg (ref 26.0–34.0)
MCHC: 33.9 g/dL (ref 30.0–36.0)
MCV: 92.3 fL (ref 78.0–100.0)
RBC: 3.9 MIL/uL (ref 3.87–5.11)

## 2011-01-12 LAB — DIFFERENTIAL
Basophils Relative: 1 % (ref 0–1)
Eosinophils Absolute: 0.1 10*3/uL (ref 0.0–0.7)
Eosinophils Relative: 2 % (ref 0–5)
Lymphs Abs: 1.7 10*3/uL (ref 0.7–4.0)
Monocytes Absolute: 0.5 10*3/uL (ref 0.1–1.0)
Monocytes Relative: 11 % (ref 3–12)

## 2011-01-12 LAB — POCT I-STAT, CHEM 8
Calcium, Ion: 1.05 mmol/L — ABNORMAL LOW (ref 1.12–1.32)
Creatinine, Ser: 1 mg/dL (ref 0.4–1.2)
Glucose, Bld: 80 mg/dL (ref 70–99)
HCT: 39 % (ref 36.0–46.0)
Hemoglobin: 13.3 g/dL (ref 12.0–15.0)
Potassium: 3.5 mEq/L (ref 3.5–5.1)

## 2011-01-12 LAB — HEMOGLOBIN A1C
Hgb A1c MFr Bld: 5.3 % (ref <5.7)
Hgb A1c MFr Bld: 5.8 % — ABNORMAL HIGH (ref <5.7)
Mean Plasma Glucose: 105 mg/dL (ref <117)
Mean Plasma Glucose: 120 mg/dL — ABNORMAL HIGH (ref <117)

## 2011-01-12 LAB — CK TOTAL AND CKMB (NOT AT ARMC)
CK, MB: 2.8 ng/mL (ref 0.3–4.0)
Relative Index: 1.3 (ref 0.0–2.5)

## 2011-01-12 LAB — TSH: TSH: 1.796 u[IU]/mL (ref 0.350–4.500)

## 2011-01-12 LAB — LIPID PANEL
Cholesterol: 178 mg/dL (ref 0–200)
Cholesterol: 182 mg/dL (ref 0–200)
HDL: 58 mg/dL (ref >39)
LDL Cholesterol: 108 mg/dL — ABNORMAL HIGH (ref 0–99)
Triglycerides: 81 mg/dL (ref <150)

## 2011-02-13 LAB — LIPASE, BLOOD: Lipase: 24 U/L (ref 11–59)

## 2011-02-13 LAB — URINALYSIS, ROUTINE W REFLEX MICROSCOPIC
Bilirubin Urine: NEGATIVE
Glucose, UA: NEGATIVE mg/dL
Hgb urine dipstick: NEGATIVE
Ketones, ur: NEGATIVE mg/dL
Nitrite: NEGATIVE
Specific Gravity, Urine: 1.011 (ref 1.005–1.030)
pH: 8 (ref 5.0–8.0)

## 2011-02-13 LAB — DIFFERENTIAL
Eosinophils Absolute: 0.1 10*3/uL (ref 0.0–0.7)
Eosinophils Relative: 2 % (ref 0–5)
Lymphs Abs: 0.7 10*3/uL (ref 0.7–4.0)
Monocytes Absolute: 0.4 10*3/uL (ref 0.1–1.0)

## 2011-02-13 LAB — COMPREHENSIVE METABOLIC PANEL
ALT: 17 U/L (ref 0–35)
AST: 17 U/L (ref 0–37)
Albumin: 3.6 g/dL (ref 3.5–5.2)
CO2: 29 mEq/L (ref 19–32)
Calcium: 8.5 mg/dL (ref 8.4–10.5)
Chloride: 101 mEq/L (ref 96–112)
GFR calc Af Amer: >60 mL/min (ref >60)
GFR calc non Af Amer: >60 mL/min (ref >60)
Sodium: 134 mEq/L — ABNORMAL LOW (ref 135–145)

## 2011-02-13 LAB — URINE CULTURE: Colony Count: 25000

## 2011-02-13 LAB — CBC
Platelets: 277 10*3/uL (ref 150–400)
RBC: 3.81 MIL/uL — ABNORMAL LOW (ref 3.87–5.11)
WBC: 5.7 10*3/uL (ref 4.0–10.5)

## 2011-03-16 NOTE — Op Note (Signed)
[name], MACADAM                  ACCOUNT NO.:  [id number]   MEDICAL RECORD NO.:  [id number]          PATIENT TYPE:  MAT   LOCATION:  [facility]C                          FACILITY:  [facility]   PHYSICIAN:  Juan H. Lily Peer, M.D.DATE OF BIRTH:  11/12/1964   DATE OF PROCEDURE:  05/31/2005  DATE OF DISCHARGE:                                 OPERATIVE REPORT   SURGEON:  Juan H. Lily Peer, M.D.   INDICATION FOR OPERATION:  A 45 year old gravida 9, para 3, AB 5 (three  spontaneous ABs, two elective ABs).  The patient had been seen this past  Saturday in the emergency room at [facility] and was found to have a  missed AB.  The patient was stable enough to be waiting to take effect on  antibiotics, and she was diagnosed recently with trichomoniasis.  A wet prep  was done in the office today, which was negative except with some mild  yeast.  The patient began to bleed and cramp and based on her ultrasound, it  appeared that she was approximately 11 weeks into the gestation.   PREOPERATIVE DIAGNOSIS:  First trimester missed abortion.   POSTOPERATIVE DIAGNOSIS:  First trimester missed abortion.   ANESTHESIA:  MAC sedation.   FINDINGS:  Tissue present at os.  Moderate amount of tissue obtained from  the D&E.   DESCRIPTION OF OPERATION:  After the patient was adequately counseled, she  was taken to the operating room, where she underwent MAC sedation.  The  patient was placed in the high lithotomy position and the vagina and  perineum were prepped and draped in the usual sterile fashion.  A red rubber  Roxan Hockey was inserted into the bladder to evacuate it of its contents.  The  uterus on examination measured approximately 12 weeks' size, no palpable  adnexal masses.  A Graves speculum was inserted into the vaginal vault and  the uterus sounded to approximately 10-12 cm, and the uterus was evacuated  with a Hunter's curette, followed by a 10 mm suction curette to evacuate the  cavity completely.   The patient had received 2 g of Ancef for prophylaxis  since she had been bleeding for a few days.  The patient tolerated the  procedure well, was transferred to the recovery room with stable vital  signs.  She was given Toradol 30 mg IV.  The patient's blood type is A  positive.  Blood loss was 100 mL, and in-and-out catheterization with a  Foley catheter was approximately 50-75 mL.       JHF/MEDQ  D:  05/31/2005  T:  05/31/2005  Job:  [id number]

## 2011-03-16 NOTE — Procedures (Signed)
[name], [name]                  ACCOUNT NO.:  [id number]   MEDICAL RECORD NO.:  [id number]          PATIENT TYPE:  OUT   LOCATION:  SLEEP CENTER                 FACILITY[facility]   PHYSICIAN:  Clinton D. Maple Hudson, MD, FCCP, FACPDATE OF BIRTH:  11/12/1964   DATE OF STUDY:                            NOCTURNAL POLYSOMNOGRAM   REFERRING PHYSICIAN:  Drue Dun, M.D.   INDICATION FOR PROCEDURE:  Hypersomnia with sleep apnea.   EPWORTH SLEEPINESS SCORE:  9/24.  BMI 35.7, weight 189 pounds, height 61  inches, neck 15 inches.   MEDICATIONS:  Home medication charted and reviewed.   SLEEP ARCHITECTURE:  Total sleep time 431 minutes with sleep efficiency  97.8%.  Stage I was 1.6%.  Stage II 63.8%.  Stage III absent.  REM 34.5%  of total sleep time.  Sleep latency 3 minutes.  REM latency 61.5  minutes.  Awake after sleep onset 6.5 minutes.  Arousal index 8.2.  At  bedtime, she took Allegra, omeprazole and Singulair.   RESPIRATORY DATA:  Apnea/hypopnea index (AHI) 7.2 per hour, respiratory  disturbance index (RDI) 9.6 per hour, 52 events were scored including 24  obstructive apneas, 1 central apnea and 27 hypopneas, 17 events met  criteria as respiratory events related to arousal averaging 2.4 per  hour.  Events were not positional.  REM AHI 15.3.  There were  insufficient early events to permit CPAP titration by split protocol on  this study night.   OXYGEN DATA:  Loud snoring with oxygen desaturation to a nadir of 84%.  Mean oxygen saturation through the study was 96.3% on room air.   CARDIAC DATA:  Normal sinus rhythm.   MOVEMENT-PARASOMNIA:  No significant movement disturbance.  No bathroom  trips.   IMPRESSIONS-RECOMMENDATIONS:  Mild obstructive sleep apnea/hypopnea  syndrome, AHI 7.2 per hour, RDI 9.6 per hour.  Events were not  positional.  Loud snoring with oxygen desaturation to a nadir of 84%.  She had an insufficient number of early events to meet protocol criteria  for CPAP  titration by split protocol on this study night.  If  conservative measures such as weight loss and encouragement to sleep off  flat of back are  insufficient, then CPAP may still be an appropriate therapeutic option.  Consider return for CPAP titration or evaluate for alternative  management as appropriate.      Clinton D. Maple Hudson, MD, [facility], FACP  Diplomate, Biomedical engineer of Sleep Medicine  Electronically Signed     CDY/MEDQ  D:  07/10/2008 11:10:27  T:  07/10/2008 12:27:26  Job:  [id number]

## 2011-03-16 NOTE — H&P (Signed)
[name], [name]                  ACCOUNT NO.:  [id number]   MEDICAL RECORD NO.:  [id number]          PATIENT TYPE:  AMB   LOCATION:  [facility]                          [facility]ACILITY:  [facility]   PHYSICIAN:  Juan H. Lily Peer, M.D.DATE OF BIRTH:  11/12/1964   DATE OF ADMISSION:  DATE OF DISCHARGE:                                HISTORY & PHYSICAL   CHIEF COMPLAINT:  First trimester miscarriage and bleeding.   HISTORY:  A 45 year old gravida 9, para 3, AB 5 (patient with 3 spontaneous  ABs and 2 elective ABs).  The patient's last menstrual period was April 17  which would place her at approximately [redacted] weeks gestation but had been seen  on Saturday at [facility], and an ultrasound demonstrated that she was  only approximately 11 weeks into her pregnancy with evidence of missed AB.  Wet prep had demonstrated Trichomoniasis for which she was treated and was  stable enough to wait for a few days before proceeding with a D&E, presented  to the office today bleeding and cramping.  Tissue was present at the  cervical os.  A wet prep was repeated.  Only Moniliasis was present.  The  patient has not eaten all day since yesterday, so we will proceed with a D&E  this afternoon.   PAST MEDICAL HISTORY:  The patient has history of asthma for which she takes  albuterol inhaler as well as Flovent and for migraines she takes Zomig, and  she thinks that she is taking a medication called what sounds like is  propranolol daily.  She was on the birth control pill and was poorly  compliant and eventually conceived.  She denied smoking or alcohol  consumption and denies any allergies.   PHYSICAL EXAMINATION:  GENERAL:  A well-developed, well-nourished female.  VITAL SIGNS:  A weight of 177 pounds.  Height 5 feet 1.5 inches tall.  HEENT:  Unremarkable.  NECK:  Supple.  Trachea midline.  No carotid bruits, no thyromegaly.  LUNGS:  Clear to auscultation without rhonchi or wheezes.  HEART:  Regular rate and rhythm.   No murmurs, rubs, or gallops.  BREAST EXAM:  Not done.  ABDOMEN:  Soft, nontender, with no rebound or guarding.  PELVIC:  As described above.  Uterus approximately 10-12 weeks' size, adnexa  difficult to evaluate.  RECTAL EXAM:  Not done.   ASSESSMENT:  A 45 year old gravida 21, para 3, now AB 6 with evidence of a  missed AB based on an ultrasound done on Saturday at [facility].  The  patient recently treated for Trichomoniasis, now continues bleeding and  cramping.  We will proceed with a D&E at [facility].  The risks,  benefits, pros, and cons of the operation were discussed.  She will receive  prophylactic antibiotics, and we will check on her Rh status, and we will be  checking a CBC as well.      JHF/MEDQ  D:  05/31/2005  T:  05/31/2005  Job:  [id number]

## 2011-03-16 NOTE — Op Note (Signed)
[name], [name]                  ACCOUNT NO.:  [id number]   MEDICAL RECORD NO.:  [id number]          PATIENT TYPE:  AMB   LOCATION:  [facility]                           [facility]ILITY[facility]  [facility]   PHYSICIAN:  Ginger Carne, MD  DATE OF BIRTH:  11/12/1964   DATE OF PROCEDURE:  11/01/2006  DATE OF DISCHARGE:                               OPERATIVE REPORT   PREOPERATIVE DIAGNOSIS:  Request for sterilization.   POSTOPERATIVE DIAGNOSIS:  Request for sterilization.   PROCEDURE:  Laparoscopic bilateral tubal banding.   SURGEON:  Ginger Carne, MD   ASSISTANT:  None.   COMPLICATIONS:  None immediate.   ESTIMATED BLOOD LOSS:  Minimal.   SPECIMEN:  None.   ANESTHESIA:  General.   OPERATIVE FINDINGS:  Both tubes were identified from their isthmus to  fimbriated ends separate and apart from their respective round  ligaments.  Uterus, tubes and ovaries appeared to be normal.  External  genitalia, vulva and vagina normal.  Large and small bowel grossly  normal.  Upper abdomen normal.   OPERATIVE PROCEDURE:  The patient prepped and draped in the usual  fashion and placed in lithotomy position.  Betadine solution used for  antiseptic and the patient was catheterized prior to the procedure.  After adequate general anesthesia, a tenaculum placed in the anterior  lip of cervix.  Afterwards a vertical infraumbilical incision was made  and the Veress needle placed in the abdomen.  Opening and closing  pressures were 10-15 mmHg.  A needle release trocar placed in the same  incision and laparoscope placed in the trocar sleeve.  An 8 mm port was  made in the left lower quadrant under direct visualization.  Both tubes  were identified, once again separate and apart from their respective  round ligaments.  They were grasped at the isthmus-ampullary junction  with Falope rings.  No active bleeding noted.  Afterwards gas released,  trocars removed.  Closure of 10-mm fascia site with 0 Vicryl suture and  4-0 Vicryl for subcuticular closure.  Instrument and sponge count were  correct.  The patient tolerated the procedure well, returned to the post  anesthesia recovery room in excellent condition.     Ginger Carne, MD  Electronically Signed    SHB/MEDQ  D:  11/01/2006  T:  11/01/2006  Job:  [id number]

## 2011-03-29 ENCOUNTER — Encounter: Payer: Self-pay | Admitting: Family Medicine

## 2011-03-29 ENCOUNTER — Ambulatory Visit (INDEPENDENT_AMBULATORY_CARE_PROVIDER_SITE_OTHER): Payer: Commercial Managed Care - PPO | Admitting: Family Medicine

## 2011-03-29 VITALS — BP 136/86 | HR 83 | Temp 98.2°F | Wt 167.0 lb

## 2011-03-29 DIAGNOSIS — K529 Noninfective gastroenteritis and colitis, unspecified: Secondary | ICD-10-CM

## 2011-03-29 DIAGNOSIS — K5289 Other specified noninfective gastroenteritis and colitis: Secondary | ICD-10-CM

## 2011-03-29 DIAGNOSIS — R300 Dysuria: Secondary | ICD-10-CM

## 2011-03-29 LAB — POCT UA - MICROSCOPIC ONLY

## 2011-03-29 LAB — POCT URINALYSIS DIPSTICK
Nitrite, UA: NEGATIVE
Protein, UA: NEGATIVE
Urobilinogen, UA: 0.2
pH, UA: 5.5

## 2011-03-29 MED ORDER — ONDANSETRON 4 MG PO TBDP: 4.0000 mg | ORAL_TABLET | Freq: Four times a day (QID) | ORAL | Status: DC | PRN

## 2011-03-29 NOTE — Progress Notes (Signed)
  Subjective:    Patient ID: [name], female    DOB: [birth year], 45 y.o.   MRN: [id number]  HPI 1.  Nausea/vomiting/abdominal pain:  Patient is 45 yo F with 2 day history of the same.  Nausea/vomiting began 2 days ago.  Describes abdominal pain as colicky, cramping pain that it intermittent in nature.  Diarrhea started yesterday described as loose watery stools.  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  Able to tolerate both food and liquids.  Vomiting is mostly what she has eaten, does not voit after taking po fluid intake.  Has some Phenergan at home but these have not relieved her pain.    2.  Dysuria:  Started at same time as above.  Also with "foul odor" to her urine.  Describes as dark, pain described as burning while urinating.    Review of Systems See HPI above for review of systems.       Objective:   Physical Exam Gen:  Alert, cooperative patient who appears stated age in no acute distress.  Vital signs reviewed. Mouth:  Dry mucus membranes Skin:  Good turgor Abd:  Soft/nondistended/nontender.  Good bowel sounds throughout all four quadrants.  No masses noted.         Assessment & Plan:

## 2011-03-30 ENCOUNTER — Telehealth: Payer: Self-pay | Admitting: Family Medicine

## 2011-03-30 DIAGNOSIS — N390 Urinary tract infection, site not specified: Secondary | ICD-10-CM

## 2011-03-30 MED ORDER — CEPHALEXIN 500 MG PO CAPS: 500.0000 mg | ORAL_CAPSULE | Freq: Two times a day (BID) | ORAL | Status: AC

## 2011-03-30 NOTE — Telephone Encounter (Signed)
Will treat for presumptive UTI, UA not helpful but patient having symptoms, will send in 7 day course of Keflex to outpatient pharmacy.

## 2011-04-01 DIAGNOSIS — R300 Dysuria: Secondary | ICD-10-CM | POA: Insufficient documentation

## 2011-04-01 DIAGNOSIS — K529 Noninfective gastroenteritis and colitis, unspecified: Secondary | ICD-10-CM | POA: Insufficient documentation

## 2011-04-01 NOTE — Assessment & Plan Note (Addendum)
No red flags. Would qualify as mildly dehydrated. Recommended to continue PO intake. Will add Zofran ODT to help with nausea. Given red flags, reasons to return/go to ED over the weekend.

## 2011-04-01 NOTE — Assessment & Plan Note (Addendum)
Check UA, likely will treat even if negative.  Will check for blood, spec grav.

## 2011-04-17 ENCOUNTER — Telehealth: Payer: Self-pay | Admitting: Family Medicine

## 2011-04-17 ENCOUNTER — Ambulatory Visit (INDEPENDENT_AMBULATORY_CARE_PROVIDER_SITE_OTHER): Payer: 59 | Admitting: Family Medicine

## 2011-04-17 VITALS — BP 128/78 | Temp 98.3°F | Wt 172.2 lb

## 2011-04-17 DIAGNOSIS — J45909 Unspecified asthma, uncomplicated: Secondary | ICD-10-CM

## 2011-04-17 DIAGNOSIS — H9209 Otalgia, unspecified ear: Secondary | ICD-10-CM | POA: Insufficient documentation

## 2011-04-17 MED ORDER — MONTELUKAST SODIUM 10 MG PO TABS: 10.0000 mg | ORAL_TABLET | Freq: Every day | ORAL | Status: DC

## 2011-04-17 MED ORDER — ALBUTEROL SULFATE HFA 108 (90 BASE) MCG/ACT IN AERS: 2.0000 | INHALATION_SPRAY | RESPIRATORY_TRACT | Status: DC | PRN

## 2011-04-17 MED ORDER — FLUTICASONE PROPIONATE 50 MCG/ACT NA SUSP: 2.0000 | Freq: Every day | NASAL | Status: DC

## 2011-04-17 MED ORDER — BUDESONIDE-FORMOTEROL FUMARATE 160-4.5 MCG/ACT IN AERO: 2.0000 | INHALATION_SPRAY | Freq: Two times a day (BID) | RESPIRATORY_TRACT | Status: DC

## 2011-04-17 NOTE — Telephone Encounter (Signed)
Pt came by and drop off an FMLA form.

## 2011-04-17 NOTE — Patient Instructions (Addendum)
Call later in the week if you feel that you need a Zpack I think you are getting a upper respiratory infection, and congestion and ear pain are part of this Begin Flonase, bid for a few days

## 2011-04-18 NOTE — Assessment & Plan Note (Signed)
Requesting FMLA papers signed in a SDA, explained that her primary provider will complete in the designated time frame established by our office.

## 2011-04-18 NOTE — Progress Notes (Signed)
  Subjective:    Patient ID: [name], female    DOB: May 05, 1966, 45 y.o.   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  Has been out of her nasal steroid for 2 weeks.  Describes herself as having persistent asthma and requiring FMLA papers filled out.  She needs refills on several of her medications.   Review of Systems  Constitutional: Negative for fever.  HENT: Positive for ear pain. Negative for hearing loss, congestion, rhinorrhea, postnasal drip, sinus pressure, tinnitus and ear discharge.   Eyes: Negative for discharge.  Respiratory: Negative for cough, chest tightness and wheezing.        Objective:   Physical Exam  Constitutional: She appears well-developed and well-nourished.  HENT:  Mouth/Throat: Oropharynx is clear and moist. No oropharyngeal exudate.       Right ear cannal clear of wax, TM pink and retracted.  Left TM with scarring, but grey and flat.  Eyes: Right eye exhibits no discharge. Left eye exhibits no discharge.  Cardiovascular: Normal rate and regular rhythm.   Pulmonary/Chest: Effort normal and breath sounds normal. She has no rales.  Lymphadenopathy:    She has no cervical adenopathy.          Assessment & Plan:

## 2011-04-18 NOTE — Assessment & Plan Note (Signed)
Reassurance, no signs of otitis media or externa.  Retraction of drum sometimes painful and recommended resuming nasal steroid.

## 2011-04-18 NOTE — Telephone Encounter (Signed)
FMLA form placed in MD box.

## 2011-04-23 ENCOUNTER — Telehealth: Payer: Self-pay | Admitting: Family Medicine

## 2011-04-23 NOTE — Telephone Encounter (Signed)
Needs her FMLA papers completed by 7/3 preferrably before.  Please call back ASAP.

## 2011-04-24 NOTE — Telephone Encounter (Signed)
Called to inform pt that paperwork is up front for her to p/u.Marland KitchenLoralee Pacas Mont Kelly

## 2011-05-04 ENCOUNTER — Ambulatory Visit: Payer: 59 | Admitting: Family Medicine

## 2011-05-24 ENCOUNTER — Ambulatory Visit (INDEPENDENT_AMBULATORY_CARE_PROVIDER_SITE_OTHER): Payer: 59 | Admitting: Family Medicine

## 2011-05-24 ENCOUNTER — Encounter: Payer: Self-pay | Admitting: Family Medicine

## 2011-05-24 ENCOUNTER — Other Ambulatory Visit (HOSPITAL_COMMUNITY)
Admission: RE | Admit: 2011-05-24 | Discharge: 2011-05-24 | Disposition: A | Discharge status: Home / Self Care | Payer: 59 | Source: Ambulatory Visit | Attending: Family Medicine | Admitting: Family Medicine

## 2011-05-24 VITALS — BP 115/81 | HR 72 | Temp 98.8°F | Ht 61.0 in | Wt 166.1 lb

## 2011-05-24 DIAGNOSIS — Z01419 Encounter for gynecological examination (general) (routine) without abnormal findings: Secondary | ICD-10-CM | POA: Insufficient documentation

## 2011-05-24 DIAGNOSIS — N760 Acute vaginitis: Secondary | ICD-10-CM

## 2011-05-24 DIAGNOSIS — N920 Excessive and frequent menstruation with regular cycle: Secondary | ICD-10-CM

## 2011-05-24 LAB — POCT WET PREP (WET MOUNT)

## 2011-05-24 LAB — POCT URINE PREGNANCY: Preg Test, Ur: NEGATIVE

## 2011-05-24 LAB — POCT HEMOGLOBIN: Hemoglobin: 12.1

## 2011-05-24 MED ORDER — NAPROXEN 500 MG PO TABS: 500.0000 mg | ORAL_TABLET | Freq: Two times a day (BID) | ORAL | Status: DC

## 2011-05-24 MED ORDER — NORGESTIMATE-ETH ESTRADIOL 0.25-35 MG-MCG PO TABS: ORAL_TABLET | ORAL | Status: DC

## 2011-05-24 NOTE — Patient Instructions (Addendum)
Day 1, take 5 pills, Day 2, 4 pills, Day 3: 3 pills, Day 4: 2 pills. Then 1 pill a day for 1 week.  Discard the pack.  Start a new pack the first Sunday after your bleeding. Naprosyn for cramping Make follow-up appt for 2-3 weeks At yorur follow-up it will be important to talk about need for further work up like endometrial biopsy

## 2011-05-24 NOTE — Progress Notes (Signed)
  Subjective:    Patient ID: [name], female    DOB: [birth year], 45 y.o.   MRN: [id number]  HPI45 yo here for evaluation of heavy menstrual bleeding.  Normal cycles June 1st, cycle came on June 25th, again July 23rd heavier than normal. Has regular cycles. Feels very heavy, can feel it "gooshing" Has been having heavy abdominal cramping, nausea.  Having clots for the past several days, saturating pads.  Overall feels weak and drained.  Nausea improving with antiemetic and ginger ale.  Taking aleve.  I have reviewed patient's  PMH, FH, and Social history and Medications as related to this visit. Has BTL, no vaginal discharge or burning with urination.  UTD on pap.  She states she has a history of cevical cryo 16 years ago.  Review of Systems    see hpi  I have reviewed patient's  PMH, FH, and Social history and Medications as related to this visit.  Objective:   Physical Exam GEN: Alert & Oriented, No acute distress Abd:  + BS, soft, no tenderness to palpation Pelvic Exam:        External: normal female genitalia without lesions or masses        Vagina: normal without lesions or masses        Cervix: normal without lesions or masses. Bleeding from os        Adnexa: normal bimanual exam without masses or fullness        Uterus: slightly enlarged        Pap smear: performed        Samples for Wet prep, GC/Chlamydia obtained         Assessment & Plan:

## 2011-05-24 NOTE — Assessment & Plan Note (Addendum)
Long standing history of menorrhagia.  Pelvic US 04/2009 shows adenomysis no fibroid. Appears to be ovulatory bleeding.  No contraindication to combined OCP.  Will start combined OCP at high dose to stop b leeding, then plan to transition to daily OCP to help with heavy bleeding.  Consider endometrial bx to complete workup.  If no improvement, would consider [facility].  May benefit from hysterectomy as likely adenomyosis.

## 2011-05-26 LAB — GC/CHLAMYDIA PROBE AMP, GENITAL
Chlamydia, DNA Probe: NEGATIVE
GC Probe Amp, Genital: UNDETERMINED

## 2011-05-29 ENCOUNTER — Telehealth: Payer: Self-pay | Admitting: Family Medicine

## 2011-05-29 DIAGNOSIS — Z113 Encounter for screening for infections with a predominantly sexual mode of transmission: Secondary | ICD-10-CM

## 2011-05-30 ENCOUNTER — Other Ambulatory Visit: Payer: 59

## 2011-05-30 DIAGNOSIS — Z113 Encounter for screening for infections with a predominantly sexual mode of transmission: Secondary | ICD-10-CM

## 2011-05-30 NOTE — Telephone Encounter (Signed)
Spoke of lab results, will come and give GC urine sample for confirmation.

## 2011-05-30 NOTE — Progress Notes (Signed)
gc urine done today Patrick Sohm

## 2011-05-30 NOTE — Telephone Encounter (Signed)
Pt returned call- pls call [phone]

## 2011-05-31 ENCOUNTER — Encounter: Payer: Self-pay | Admitting: Family Medicine

## 2011-05-31 ENCOUNTER — Telehealth: Payer: Self-pay | Admitting: Family Medicine

## 2011-05-31 LAB — GC PROBE AMPLIFICATION, URINE: GC Probe Amp, Urine: NEGATIVE

## 2011-05-31 NOTE — Telephone Encounter (Signed)
Is calling for her lab results.

## 2011-05-31 NOTE — Telephone Encounter (Signed)
Called patient and advised GC urine test result was negative.

## 2011-05-31 NOTE — Telephone Encounter (Signed)
Is calling again for her lab results

## 2011-06-07 ENCOUNTER — Ambulatory Visit (INDEPENDENT_AMBULATORY_CARE_PROVIDER_SITE_OTHER): Payer: 59 | Admitting: Family Medicine

## 2011-06-07 ENCOUNTER — Encounter: Payer: Self-pay | Admitting: Family Medicine

## 2011-06-07 VITALS — BP 119/79 | HR 70 | Temp 98.8°F | Ht 61.0 in | Wt 168.0 lb

## 2011-06-07 DIAGNOSIS — N920 Excessive and frequent menstruation with regular cycle: Secondary | ICD-10-CM

## 2011-06-07 LAB — POCT HEMOGLOBIN: Hemoglobin: 11.1

## 2011-06-07 MED ORDER — MEDROXYPROGESTERONE ACETATE 150 MG/ML IM SUSP
150.0000 mg | Freq: Once | INTRAMUSCULAR | Status: AC
  Administered 2011-06-07: 150 mg via INTRAMUSCULAR

## 2011-06-07 MED ORDER — FERROUS SULFATE 325 (65 FE) MG PO TABS: 325.0000 mg | ORAL_TABLET | Freq: Three times a day (TID) | ORAL | Status: DC

## 2011-06-07 MED ORDER — KETOROLAC TROMETHAMINE 60 MG/2ML IM SOLN
60.0000 mg | Freq: Once | INTRAMUSCULAR | Status: AC
  Administered 2011-06-07: 60 mg via INTRAMUSCULAR

## 2011-06-07 NOTE — Patient Instructions (Signed)
It was good to see you today  I am referring you to the gynecologist for your persistent menstrual bleeding  Because you can't tolerate oral contraceptives, I am giving you a shot of depo-provera to help with your menstrual bleeding I am also giving you a shot of toradol for your menstrual bleeding and intermittent cramping You can take ibuprofen/aleve on a scheduled basis for the next 1-2 weeks.  I am starting you on iron for your bleeding. Call if you have any other questions, God Bless,  Doree Albee MD

## 2011-06-08 NOTE — Assessment & Plan Note (Addendum)
This has been a longstanding issue for the pt.  Pt is unwilling to resume OCPs but is willing for depo-provera. Pt also given toradol 60IM in clinic with recommendation to take scheduled NSAIDs for menstrual bleeding and control. Pt with known uterine fibroids. Will refer to GYN for further management of this as she would likely benefit from hysterectomy given fiborids as well as pt having BTL previously.  Will also start pt on iron.  Pt agreeable to plan.

## 2011-06-08 NOTE — Progress Notes (Signed)
  Subjective:    Patient ID: [name], female    DOB: 04/17/1966, 45 y.o.   MRN: [id number]  HPI Pt with longstanding hx/o menorrhagia. Pt is here for evaluation of breakthrough bleeding. Pt was recently seen at [facility] for menorrhagia by Dr. Earnest Bailey (05/24/11). Pt was placed on stress pack of sprintec; taking 5 hormone containing pills x1, 4 pills there following day, 3 pills the following day until pt was taking 1 pill daily. Pt states that this helped resolve menstrual bleeding. Pt states that around 4-5th day of taking stress taper, she noticed significant headaches and stopped taking medication. Pt states that she has had chronic headaches with use of OCPs in the past. Pt states that menstrual bleeding returned on discontinuation of sprintec. Has been currently bleeding daily for the last week. Pt is currently going through 4-5 pads/tampons daily. Some intermittent breakthrough bleeding.  Pt has tried depo-provera in the past with some improvement in menstrual bleeding. She does not associate headaches with depo administration. Pt has also been using intermittent NSAIDs for menstrual bleeding and cramping. No fevers, nausea, vomiting, fever, dysuria, constipation.  Review of Systems See HPI, 12 point review of systems otherwise negative     Objective:   Physical Exam Gen: in chair, NAD CV: RRR PULM: CTAB ADB: S/NT/+ bowel sounds; no pelvic tenderness    Assessment & Plan:

## 2011-06-11 ENCOUNTER — Telehealth: Payer: Self-pay | Admitting: Family Medicine

## 2011-06-11 NOTE — Telephone Encounter (Signed)
Was in a few days ago and was referred to [facility], but they have said that it will be October before they can get her in.  She doesn't want to have to continue to bleed until October and would like to speak with an nurse.

## 2011-06-12 NOTE — Telephone Encounter (Signed)
Will route this to her PCP given that there is not much I can recommend.  She will require medical intervention.

## 2011-06-12 NOTE — Telephone Encounter (Signed)
Patient was told by [facility] that it would be October before she could even get an appointment scheduled.  She had a depo injection at last week OV and it helped some but the bleeding has returned.  Some days it is heavy and some days it is light.  Says that she is getting pretty irritated due to having to wear a pad for so many days.  Cannot take anything oral - causes HAs and nausea. 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  Advised her to get off her feet as soon as she gets home.  She is managing the pain with Advil and Ibuprofen.  Told her I would call the AD ay [facility] to see if she could get her in sooner to be seen.  I am awaiting Tresa Endo Rassette's call back then I will call the patient.  Patient appreciative.

## 2011-06-12 NOTE — Telephone Encounter (Signed)
Attempted to call back.  No answer.  Will try her at her work number.

## 2011-06-14 NOTE — Telephone Encounter (Signed)
Contacted Kendra Kendra.  She made an appointment for 8/23 with Dr. Shawnie Pons.

## 2011-06-21 ENCOUNTER — Ambulatory Visit: Payer: 59 | Admitting: Family Medicine

## 2011-06-21 ENCOUNTER — Encounter: Payer: Self-pay | Admitting: Family Medicine

## 2011-06-21 ENCOUNTER — Other Ambulatory Visit (HOSPITAL_COMMUNITY)
Admission: RE | Admit: 2011-06-21 | Discharge: 2011-06-21 | Disposition: A | Discharge status: Home / Self Care | Payer: 59 | Source: Ambulatory Visit | Attending: Family Medicine | Admitting: Family Medicine

## 2011-06-21 ENCOUNTER — Ambulatory Visit (INDEPENDENT_AMBULATORY_CARE_PROVIDER_SITE_OTHER): Payer: 59 | Admitting: Family Medicine

## 2011-06-21 VITALS — BP 124/83 | HR 82 | Temp 98.4°F | Ht 61.0 in | Wt 167.3 lb

## 2011-06-21 DIAGNOSIS — N920 Excessive and frequent menstruation with regular cycle: Secondary | ICD-10-CM | POA: Insufficient documentation

## 2011-06-21 LAB — POCT PREGNANCY, URINE: Preg Test, Ur: NEGATIVE

## 2011-06-21 NOTE — Patient Instructions (Addendum)
You had an endometrial biopsy today.  Cramping is normal.  Ibuprofen should take care of this.  The results will be available in 2 weeks.  You will return for a discussion of this.  Menorrhagia, Heavy Periods Dysfunctional uterine bleeding is different from a normal menstrual period. When periods are heavy or there is more bleeding than is usual for you, it is called menorrhagia. It may be caused by hormonal imbalance, or physical, metabolic, or other problems. Examination is necessary in order that your caregiver may treat treatable causes. If this is a continuing problem, a D&C may be needed. That means that the cervix (the opening of the uterus or womb) is dilated (stretched larger) and the lining of the uterus is scraped out. The tissue scraped out is then examined under a microscope by a specialist (pathologist) to make sure there is nothing of concern that needs further or more extensive treatment. HOME CARE INSTRUCTIONS  If medications were prescribed, take exactly as directed. Do not change or switch medications without consulting your caregiver.   Long term heavy bleeding may result in iron deficiency. Your caregiver may have prescribed iron pills. They help replace the iron your body lost from heavy bleeding. Take exactly as directed. Iron may cause constipation. If this becomes a problem, increase the bran, fruits, and roughage in your diet.   Do not take aspirin or medicines that contain aspirin one week before or during your menstrual period. Aspirin may make the bleeding worse.   If you need to change your sanitary pad or tampon more than once every 2 hours, stay in bed and rest as much as possible until the bleeding stops.   Eat well-balanced meals. Eat foods high in iron. Examples are leafy green vegetables, meat, liver, eggs, and whole grain breads and cereals. Do not try to lose weight until the abnormal bleeding has stopped and your blood iron level is back to normal.  SEEK MEDICAL  CARE IF:  You need to change your sanitary pad or tampon more than once an hour.   You develop nausea (feeling sick to your stomach) and vomiting, dizziness, or diarrhea while you are taking your medicine.   You have any problems that may be related to the medicine you are taking.  SEEK IMMEDIATE MEDICAL CARE IF:  An oral temperature above 101 develops.   You develop chills.   You develop severe bleeding or start to pass blood clots.   You feel dizzy or faint.  MAKE SURE YOU:   Understand these instructions.   Will watch your condition.   Will get help right away if you are not doing well or get worse.  Document Released: 10/15/2005 Document Re-Released: 09/27/2008 [facility] Patient Information 2011 [place], [place].

## 2011-06-21 NOTE — Progress Notes (Signed)
Addended by: Lynnell Dike on: 06/21/2011 03:28 PM   Modules accepted: Orders

## 2011-06-21 NOTE — Assessment & Plan Note (Signed)
EMB, consider definitive rx.

## 2011-06-21 NOTE — Progress Notes (Signed)
  Subjective:    Patient ID: [name], female    DOB: 06/06/1966, 45 y.o.   MRN: [id number]  HPI Comments: Had very heavy menses in July, followed by low hgb.  Trial of OC's which patient did not tolerate well, secondary to nausea, vomiting, headache.  Pt. Then had DepoProvera which has improved bleeding, but she is uninterested in staying on this forever.  She had her last pelvic sonogram 2 years ago.  Recent TSH was nml, as was her last Hgb.  Vaginal Bleeding The patient's pertinent negatives include no pelvic pain. This is a recurrent problem. The current episode started more than 1 month ago. The problem has been gradually improving. The pain is moderate. She is not pregnant. Associated symptoms include frequency and headaches. Pertinent negatives include no abdominal pain, back pain, constipation, diarrhea, fever, nausea, rash, urgency or vomiting. The vaginal bleeding is heavier than menses. She has been passing clots. She has not been passing tissue. The symptoms are aggravated by nothing. She is not sexually active. She uses tubal ligation for contraception. Her menstrual history has been regular. Her past medical history is significant for an abdominal surgery and a Cesarean section.      Review of Systems  Constitutional: Negative for fever.  HENT: Negative for facial swelling.   Respiratory: Positive for wheezing. Negative for cough, choking, chest tightness and shortness of breath.   Cardiovascular: Negative for chest pain.  Gastrointestinal: Negative for nausea, vomiting, abdominal pain, diarrhea, constipation and abdominal distention.  Genitourinary: Positive for frequency, vaginal bleeding and menstrual problem. Negative for urgency, decreased urine volume and pelvic pain.  Musculoskeletal: Negative for myalgias, back pain and arthralgias.  Skin: Negative for rash.  Neurological: Positive for headaches. Negative for seizures and syncope.  Psychiatric/Behavioral: Negative for  behavioral problems.       Objective:   Physical Exam  Constitutional: She appears well-developed and well-nourished.  HENT:  Head: Normocephalic and atraumatic.  Neck: Normal range of motion. Neck supple.  Cardiovascular: Normal rate and regular rhythm.   Pulmonary/Chest: Effort normal.  Abdominal: Soft.  Genitourinary: Vagina normal. Cervix exhibits no motion tenderness.       Cryo scar noted. Patient given informed consent, signed copy in the chart, time out was performed. Appropriate time out taken. . The patient was placed in the lithotomy position and the cervix brought into view with sterile speculum.  Portio of cervix cleansed x 2 with betadine swabs.  A tenaculum was placed in the anterior lip of the cervix.  The uterus was sounded for depth of 8 cm. A pipelle was introduced to into the uterus, suction created,  and an endometrial sample was obtained. All equipment was removed and accounted for.  The patient tolerated the procedure well.             Assessment & Plan:  Menorrhagia with nml TSH. S/p EMB-await results Check pelvic sonogram.   Patient given post procedure instructions. The patient will return in 2 weeks for results.

## 2011-06-25 ENCOUNTER — Ambulatory Visit (HOSPITAL_COMMUNITY)
Admission: RE | Admit: 2011-06-25 | Discharge: 2011-06-25 | Disposition: A | Discharge status: Home / Self Care | Payer: 59 | Source: Ambulatory Visit | Attending: Family Medicine | Admitting: Family Medicine

## 2011-06-25 DIAGNOSIS — N920 Excessive and frequent menstruation with regular cycle: Secondary | ICD-10-CM | POA: Insufficient documentation

## 2011-06-28 ENCOUNTER — Ambulatory Visit (INDEPENDENT_AMBULATORY_CARE_PROVIDER_SITE_OTHER): Payer: 59 | Admitting: Obstetrics & Gynecology

## 2011-06-28 ENCOUNTER — Encounter: Payer: Self-pay | Admitting: Obstetrics & Gynecology

## 2011-06-28 VITALS — BP 132/89 | HR 87 | Temp 98.7°F | Wt 168.9 lb

## 2011-06-28 DIAGNOSIS — N949 Unspecified condition associated with female genital organs and menstrual cycle: Secondary | ICD-10-CM

## 2011-06-28 DIAGNOSIS — N938 Other specified abnormal uterine and vaginal bleeding: Secondary | ICD-10-CM

## 2011-06-28 NOTE — Progress Notes (Signed)
  Subjective:    Patient ID: [name], female    DOB: 1966-09-13, 45 y.o.   MRN: [id number]  HPI Patient had EMB 1 week ago, no result available. US done on 8/27    Review of Systems     Objective:   Physical Exam  deferred      Assessment & Plan:   Normal endometrial stripe on [place] reassuring. Pt received DMPA 1 mo ago. Will call with bx result and she will return 3 mos.

## 2011-07-11 ENCOUNTER — Telehealth: Payer: Self-pay | Admitting: *Deleted

## 2011-07-11 NOTE — Telephone Encounter (Signed)
Pt called and wanted results of biopsy from last month.  I called pt and informed her of normal endometrial biopsy results. She will call back to schedule appt for November as instructed by Dr. Debroah Loop.

## 2011-07-20 LAB — I-STAT 8, (EC8 V) (CONVERTED LAB)
Acid-Base Excess: 1
BUN: 11
Chloride: 105
HCT: 39
Hemoglobin: 13.3
Operator id: 277751
Potassium: 3.7
pCO2, Ven: 38 — ABNORMAL LOW

## 2011-07-20 LAB — POCT CARDIAC MARKERS
CKMB, poc: 2
Myoglobin, poc: 37.7
Myoglobin, poc: 52.3
Operator id: 277751
Operator id: 277751
Operator id: 277751
Troponin i, poc: <0.05
Troponin i, poc: <0.05

## 2011-07-20 LAB — DIFFERENTIAL
Eosinophils Relative: 3
Lymphocytes Relative: 39
Lymphs Abs: 2.1
Monocytes Absolute: 0.6
Monocytes Relative: 10

## 2011-07-20 LAB — CBC
HCT: 35.6 — ABNORMAL LOW
Hemoglobin: 12.3
WBC: 5.5

## 2011-08-16 ENCOUNTER — Ambulatory Visit: Payer: 59

## 2011-08-23 ENCOUNTER — Ambulatory Visit (INDEPENDENT_AMBULATORY_CARE_PROVIDER_SITE_OTHER): Payer: 59 | Admitting: *Deleted

## 2011-08-23 ENCOUNTER — Other Ambulatory Visit: Payer: Self-pay | Admitting: Family Medicine

## 2011-08-23 DIAGNOSIS — N920 Excessive and frequent menstruation with regular cycle: Secondary | ICD-10-CM

## 2011-08-23 MED ORDER — MEDROXYPROGESTERONE ACETATE 150 MG/ML IM SUSP: 150.0000 mg | INTRAMUSCULAR | Status: DC

## 2011-08-23 MED ORDER — MEDROXYPROGESTERONE ACETATE 150 MG/ML IM SUSP
150.0000 mg | Freq: Once | INTRAMUSCULAR | Status: AC
  Administered 2011-08-23: 150 mg via INTRAMUSCULAR

## 2011-08-23 NOTE — Progress Notes (Signed)
Patient plans to follow up with  Dr. Shawnie Pons in November.

## 2011-10-17 ENCOUNTER — Encounter: Payer: Self-pay | Admitting: Family Medicine

## 2011-10-17 ENCOUNTER — Ambulatory Visit (INDEPENDENT_AMBULATORY_CARE_PROVIDER_SITE_OTHER): Payer: 59 | Admitting: Family Medicine

## 2011-10-17 VITALS — BP 137/64 | HR 86 | Temp 98.3°F | Ht 61.0 in | Wt 173.4 lb

## 2011-10-17 DIAGNOSIS — N920 Excessive and frequent menstruation with regular cycle: Secondary | ICD-10-CM

## 2011-10-17 MED ORDER — MEGESTROL ACETATE 40 MG PO TABS: 40.0000 mg | ORAL_TABLET | Freq: Every day | ORAL | Status: AC

## 2011-10-17 MED ORDER — ESOMEPRAZOLE MAGNESIUM 40 MG PO CPDR: 40.0000 mg | DELAYED_RELEASE_CAPSULE | Freq: Every day | ORAL | Status: DC

## 2011-10-17 NOTE — Assessment & Plan Note (Signed)
Continued dysfunctional anovulatory bleeding despite depo. She does not want to continue taking medications. She could be perimenopausal with hot flashes and irregular bleeding. She has an appointment in Jan. With GYN - I advised her to discuss Ablation further as a treatment modality. In the meantime I will treat her with Megace 40 mg daily. Weight gain from increased appetite explained to patient.

## 2011-10-17 NOTE — Progress Notes (Signed)
  Subjective:    Patient ID: [name], female    DOB: 02-02-1966, 45 y.o.   MRN: [id number]  HPI 1. Menorrhagia. Patient has been evaluated with Pelvis U/S - normal stripe, endometrial biopsy - normal pathology results. She was placed on OCP's which did not help her bleeding. She was give depo-provera which worked temporarily. She says she is having 7 day long periods, and have intermittent bleeding between menses.  She is also experiencing hot flashes.   Review of Systems No: fever, chills, night sweats, weight loss, headaches, falls, syncope, dysuria, pelvic pain.     Objective:   Physical Exam Filed Vitals:   10/17/11 1533  BP: 137/64  Pulse: 86  Temp: 98.3 F (36.8 C)  TempSrc: Oral  Height: 5\' 1"  (1.549 m)  Weight: 173 lb 6 oz (78.642 kg)   Abdomen: soft and non-tender without masses, organomegaly or hernias noted.  No guarding or rebound Lungs:  Normal respiratory effort, chest expands symmetrically. Lungs are clear to auscultation, no crackles or wheezes. Extremities:  No cyanosis, edema, or deformity noted  Heart - Regular rate and rhythm.  No murmurs, gallops or rubs.       Assessment & Plan:

## 2011-10-17 NOTE — Patient Instructions (Signed)
It was great to see you today!  Schedule an appointment to see me after you see OBGYN for possible ablation.  Great work on your sleep apnea.  I refilled your omeprazole.  I sent in a prescription for Megace for your bleeding.

## 2011-11-12 ENCOUNTER — Ambulatory Visit (INDEPENDENT_AMBULATORY_CARE_PROVIDER_SITE_OTHER): Payer: 59 | Admitting: Obstetrics & Gynecology

## 2011-11-12 VITALS — BP 136/89 | HR 86 | Temp 97.3°F | Ht 61.0 in | Wt 165.9 lb

## 2011-11-12 DIAGNOSIS — N949 Unspecified condition associated with female genital organs and menstrual cycle: Secondary | ICD-10-CM

## 2011-11-12 DIAGNOSIS — N938 Other specified abnormal uterine and vaginal bleeding: Secondary | ICD-10-CM

## 2011-11-12 DIAGNOSIS — Z01818 Encounter for other preprocedural examination: Secondary | ICD-10-CM

## 2011-11-12 DIAGNOSIS — Z01419 Encounter for gynecological examination (general) (routine) without abnormal findings: Secondary | ICD-10-CM

## 2011-11-12 LAB — CBC
MCHC: 33.8 g/dL (ref 30.0–36.0)
Platelets: 345 10*3/uL (ref 150–400)
RDW: 12.8 % (ref 11.5–15.5)
WBC: 4.9 10*3/uL (ref 4.0–10.5)

## 2011-11-12 MED ORDER — MEDROXYPROGESTERONE ACETATE 10 MG PO TABS: 20.0000 mg | ORAL_TABLET | Freq: Every day | ORAL | Status: DC

## 2011-11-12 NOTE — Progress Notes (Signed)
Telephoned pt and she advised she was having trouble with irregular bleeding.

## 2011-11-12 NOTE — Patient Instructions (Signed)
Menorrhagia Dysfunctional uterine bleeding is different from a normal menstrual period. When periods are heavy or there is more bleeding than is usual for you, it is called menorrhagia. It may be caused by hormonal imbalance, or physical, metabolic, or other problems. Examination is necessary in order that your caregiver may treat treatable causes. If this is a continuing problem, surgery may be needed.  HOME CARE INSTRUCTIONS   If medications were prescribed, take exactly as directed. Do not change or switch medications without consulting your caregiver.   Long term heavy bleeding may result in iron deficiency. Your caregiver may have prescribed iron pills. They help replace the iron your body lost from heavy bleeding. Take exactly as directed. Iron may cause constipation. If this becomes a problem, increase the bran, fruits, and roughage in your diet.   Do not take aspirin or medicines that contain aspirin one week before or during your menstrual period. Aspirin may make the bleeding worse.   If you need to change your sanitary pad or tampon more than once every 2 hours, stay in bed and rest as much as possible until the bleeding stops.   Eat well-balanced meals. Eat foods high in iron. Examples are leafy green vegetables, meat, liver, eggs, and whole grain breads and cereals. Do not try to lose weight until the abnormal bleeding has stopped and your blood iron level is back to normal.  SEEK MEDICAL CARE IF:   You need to change your sanitary pad or tampon more than once an hour.   You develop nausea (feeling sick to your stomach) and vomiting, dizziness, or diarrhea while you are taking your medicine.   You have any problems that may be related to the medicine you are taking.  SEEK IMMEDIATE MEDICAL CARE IF:   You have a fever.   You develop chills.   You develop severe bleeding or start to pass blood clots.   You feel dizzy or faint.  MAKE SURE YOU:   Understand these instructions.    Will watch your condition.   Will get help right away if you are not doing well or get worse.  Document Released: 10/15/2005 Document Revised: 06/27/2011 Document Reviewed: 06/04/2008 [facility] Patient Information 2012 [place], [place].

## 2011-11-12 NOTE — Progress Notes (Signed)
  Subjective:     [name] is a 46 y.o. female here follow up of DUB.  Patient has a long history of DUB, and has been evaluated.  She has a normal endometrial biopsy and ultrasound showed a normal 10 week uterus.  She has been treated with Depo Provera and Megace, she wants an ablation because "she is tired of bleeding".  Feels dizzy and lightheaded.  She is currently having a small amount of bleeding.     Gynecologic History Patient's last menstrual period was 10/13/2011. Last Pap: 04/2011 Results were: normal Last mammogram: 09/2010. Results were: normal  Obstetric History OB History    Grav Para Term Preterm Abortions TAB SAB Ect Mult Living   8 4 4  0 4     4     # Outc Date GA Lbr Len/2nd Wgt Sex Del Anes PTL Lv   1 TRM      SVD      2 TRM      LTCS      3 TRM      LTCS      4 TRM      LTCS      5 ABT            6 ABT            7 ABT            8 ABT                The following portions of the patient's history were reviewed and updated as appropriate: allergies, current medications, past family history, past medical history, past social history, past surgical history and problem list.  Review of Systems A comprehensive review of systems was negative.    Objective:  Blood pressure 136/89, pulse 86, temperature 97.3 F (36.3 C), temperature source Oral, height 5\' 1"  (1.549 m), weight 165 lb 14.4 oz (75.252 kg), last menstrual period 10/13/2011.  GENERAL: Well-developed, well-nourished female in no acute distress.  HEENT: Normocephalic, atraumatic. Sclerae anicteric.  NECK: Supple. Normal thyroid.  LUNGS: Clear to auscultation bilaterally.  HEART: Regular rate and rhythm. BREASTS: Symmetric with everted nipples. No masses, skin changes, nipple drainage, or lymphadenopathy. ABDOMEN: Soft, nontender, nondistended. No organomegaly. PELVIC: Normal external female genitalia. Vagina is pink and rugated.  Normal discharge. Normal cervix contour.  Uterus is normal in size.  No adnexal mass or tenderness.  EXTREMITIES: No cyanosis, clubbing, or edema, 2+ distal pulses.     Assessment:  DUB, desires ablation.   Plan:  Discussed different ablation modalities.  Patient desires HTA.  Surgical scheduler will contact her with time and date.  Provera given in the meantime.  Will schedule mammogram.

## 2011-11-13 ENCOUNTER — Telehealth: Payer: Self-pay | Admitting: *Deleted

## 2011-11-13 NOTE — Telephone Encounter (Signed)
Message copied by Jill Side on Tue Nov 13, 2011  9:27 AM ------      Message from: [name] A      Created: Tue Nov 13, 2011  7:29 AM       Please call patient to reassure her that her Hgb is normal, not anemic.  Thanks.

## 2011-11-13 NOTE — Telephone Encounter (Signed)
Called pt and left message on her personal voice mail that her lab results have been reviewed by Dr. Macon Large and the Hgb is normal. She is not anemic.

## 2011-11-14 ENCOUNTER — Other Ambulatory Visit: Payer: Self-pay | Admitting: Obstetrics & Gynecology

## 2011-11-26 ENCOUNTER — Telehealth: Payer: Self-pay | Admitting: *Deleted

## 2011-11-26 DIAGNOSIS — N938 Other specified abnormal uterine and vaginal bleeding: Secondary | ICD-10-CM

## 2011-11-26 NOTE — Telephone Encounter (Signed)
Pt called stating had provera filled at [facility] patient pharmacy and was given only 30 pills and was suppose to be taking twice a day. Pt states has no more medication and pharmacy will not refill due to be early. Pt states still having bleeding.

## 2011-11-27 MED ORDER — MEDROXYPROGESTERONE ACETATE 10 MG PO TABS: 20.0000 mg | ORAL_TABLET | Freq: Every day | ORAL | Status: DC

## 2011-11-27 NOTE — Telephone Encounter (Signed)
Medication refilled with sixty tablets.

## 2011-11-28 NOTE — Telephone Encounter (Signed)
Telephoned work # pt was not working today. Telephoned mobile # left message to return call.

## 2011-12-04 ENCOUNTER — Telehealth: Payer: Self-pay | Admitting: *Deleted

## 2011-12-04 NOTE — Telephone Encounter (Signed)
Call returned to pt and message left that a new Rx for her medication was sent to her pharmacy last week. She may call back if sh has additional questions.

## 2011-12-04 NOTE — Telephone Encounter (Signed)
Pt left message stating she was returning a nurse call from last week. She would like call back.

## 2011-12-05 ENCOUNTER — Encounter (HOSPITAL_COMMUNITY): Payer: Self-pay | Admitting: Pharmacist

## 2011-12-06 NOTE — Telephone Encounter (Signed)
Pt stopped by office. Advised rx was sent to pharmacy.

## 2011-12-08 ENCOUNTER — Encounter (HOSPITAL_COMMUNITY): Payer: Self-pay

## 2011-12-10 ENCOUNTER — Telehealth: Payer: Self-pay | Admitting: *Deleted

## 2011-12-10 NOTE — Telephone Encounter (Signed)
Patient called and states she is calling about her FMLA papers she brought in last week , wants to see if they are filled out and returned to [employer], please call me at work at [phone]

## 2011-12-10 NOTE — Telephone Encounter (Signed)
Patient called again and left another message she is calling about her FMLA papers being turned in so she can get her procedure next week- states call her at [phone]

## 2011-12-10 NOTE — Telephone Encounter (Signed)
[name] called and left another message stating she received a call at 3:35pm and yes, you can fax the papers to HR to [employer] and also leave me a copy- I will pick it up- you can call me at work tomorrow at [phone]. Thank you very much

## 2011-12-11 ENCOUNTER — Encounter (HOSPITAL_COMMUNITY)
Admission: RE | Admit: 2011-12-11 | Discharge: 2011-12-11 | Disposition: A | Discharge status: Home / Self Care | Payer: 59 | Source: Ambulatory Visit | Attending: Obstetrics & Gynecology | Admitting: Obstetrics & Gynecology

## 2011-12-11 ENCOUNTER — Encounter (HOSPITAL_COMMUNITY): Payer: Self-pay

## 2011-12-11 HISTORY — DX: Other specified postprocedural states: Z98.890

## 2011-12-11 HISTORY — DX: Nausea with vomiting, unspecified: R11.2

## 2011-12-11 HISTORY — DX: Gastro-esophageal reflux disease without esophagitis: K21.9

## 2011-12-11 HISTORY — DX: Cardiac arrhythmia, unspecified: I49.9

## 2011-12-11 HISTORY — DX: Sleep apnea, unspecified: G47.30

## 2011-12-11 LAB — CBC
Hemoglobin: 12.8 g/dL (ref 12.0–15.0)
MCH: 31.4 pg (ref 26.0–34.0)
Platelets: 311 10*3/uL (ref 150–400)
RBC: 4.08 MIL/uL (ref 3.87–5.11)
WBC: 5.5 10*3/uL (ref 4.0–10.5)

## 2011-12-11 NOTE — Pre-Procedure Instructions (Signed)
Spoke with Dr Malen Gauze regarding patient's history and medications.  Patient advised as to what med to take DOS.  Patient to bring albuterol inhaler with her DOS to hospital

## 2011-12-11 NOTE — Telephone Encounter (Signed)
Called pt @ work this morning to notify her that the FMLA papers have been faxed and her copy is ready for pick up. She was not @ work. I left a message on her personal voice mail from mobile phone of the [facility] paper information.

## 2011-12-11 NOTE — Patient Instructions (Addendum)
   Your procedure is scheduled on: Tuesday, Feb 19th  Enter through the [employer] of [facility] at: 8am Pick up the phone at the desk and dial [phone] and inform us of your arrival.  Please call this number if you have any problems the morning of surgery: [phone]  Remember: Do not eat food after midnight: Monday Do not drink clear liquids after: Monday Take these medicines the morning of surgery with a SIP OF WATER: Per anesthesia instructions  Do not wear jewelry, make-up, or FINGER nail polish Do not wear lotions, powders, perfumes or deodorant. Do not shave 48 hours prior to surgery. Do not bring valuables to the hospital.  Patients discharged on the day of surgery will not be allowed to drive home.   Home with Mother Estrella Myrtle cell [phone]   Remember to use your hibiclens as instructed.Please shower with 1/2 bottle the evening before your surgery and the other 1/2 bottle the morning of surgery.

## 2011-12-18 ENCOUNTER — Encounter (HOSPITAL_COMMUNITY)
Admission: RE | Disposition: A | Discharge status: Home Health | Payer: Self-pay | Source: Ambulatory Visit | Attending: Obstetrics & Gynecology

## 2011-12-18 ENCOUNTER — Ambulatory Visit (HOSPITAL_COMMUNITY): Payer: 59 | Admitting: Anesthesiology

## 2011-12-18 ENCOUNTER — Encounter (HOSPITAL_COMMUNITY): Payer: Self-pay | Admitting: Anesthesiology

## 2011-12-18 ENCOUNTER — Encounter (HOSPITAL_COMMUNITY): Payer: Self-pay | Admitting: *Deleted

## 2011-12-18 ENCOUNTER — Ambulatory Visit (HOSPITAL_COMMUNITY)
Admission: RE | Admit: 2011-12-18 | Discharge: 2011-12-18 | Disposition: A | Discharge status: Home Health | Payer: 59 | Source: Ambulatory Visit | Attending: Obstetrics & Gynecology | Admitting: Obstetrics & Gynecology

## 2011-12-18 ENCOUNTER — Other Ambulatory Visit: Payer: Self-pay | Admitting: Obstetrics & Gynecology

## 2011-12-18 DIAGNOSIS — N920 Excessive and frequent menstruation with regular cycle: Secondary | ICD-10-CM | POA: Insufficient documentation

## 2011-12-18 DIAGNOSIS — N8000 Endometriosis of the uterus, unspecified: Secondary | ICD-10-CM | POA: Insufficient documentation

## 2011-12-18 LAB — PREGNANCY, URINE: Preg Test, Ur: NEGATIVE

## 2011-12-18 SURGERY — DILATATION & CURETTAGE/HYSTEROSCOPY WITH HYDROTHERMAL ABLATION
Anesthesia: General | Site: Abdomen | Wound class: Clean Contaminated

## 2011-12-18 MED ORDER — ONDANSETRON HCL 4 MG/2ML IJ SOLN
INTRAMUSCULAR | Status: DC | PRN
  Administered 2011-12-18: 4 mg via INTRAVENOUS

## 2011-12-18 MED ORDER — DEXAMETHASONE SODIUM PHOSPHATE 4 MG/ML IJ SOLN
INTRAMUSCULAR | Status: DC | PRN
  Administered 2011-12-18: 10 mg via INTRAVENOUS

## 2011-12-18 MED ORDER — OXYCODONE-ACETAMINOPHEN 5-325 MG PO TABS: 1.0000 | ORAL_TABLET | Freq: Four times a day (QID) | ORAL | Status: AC | PRN

## 2011-12-18 MED ORDER — FENTANYL CITRATE 0.05 MG/ML IJ SOLN: 25.0000 ug | INTRAMUSCULAR | Status: DC | PRN

## 2011-12-18 MED ORDER — FENTANYL CITRATE 0.05 MG/ML IJ SOLN
INTRAMUSCULAR | Status: DC | PRN
  Administered 2011-12-18: 100 ug via INTRAVENOUS

## 2011-12-18 MED ORDER — BUPIVACAINE HCL (PF) 0.5 % IJ SOLN
INTRAMUSCULAR | Status: DC | PRN
  Administered 2011-12-18: 30 mL

## 2011-12-18 MED ORDER — KETOROLAC TROMETHAMINE 30 MG/ML IJ SOLN
INTRAMUSCULAR | Status: DC | PRN
  Administered 2011-12-18: 30 mg via INTRAVENOUS

## 2011-12-18 MED ORDER — OXYCODONE-ACETAMINOPHEN 5-325 MG PO TABS
1.0000 | ORAL_TABLET | ORAL | Status: DC | PRN
  Administered 2011-12-18: 1 via ORAL

## 2011-12-18 MED ORDER — OXYCODONE-ACETAMINOPHEN 5-325 MG PO TABS
ORAL_TABLET | ORAL | Status: AC
  Filled 2011-12-18: qty 1

## 2011-12-18 MED ORDER — MIDAZOLAM HCL 5 MG/5ML IJ SOLN
INTRAMUSCULAR | Status: DC | PRN
  Administered 2011-12-18: 2 mg via INTRAVENOUS

## 2011-12-18 MED ORDER — LIDOCAINE HCL (CARDIAC) 20 MG/ML IV SOLN
INTRAVENOUS | Status: DC | PRN
  Administered 2011-12-18: 80 mg via INTRAVENOUS

## 2011-12-18 MED ORDER — LACTATED RINGERS IV SOLN
INTRAVENOUS | Status: DC
  Administered 2011-12-18 (×2): via INTRAVENOUS

## 2011-12-18 MED ORDER — DOCUSATE SODIUM 100 MG PO CAPS: 100.0000 mg | ORAL_CAPSULE | Freq: Two times a day (BID) | ORAL | Status: AC | PRN

## 2011-12-18 MED ORDER — SODIUM CHLORIDE 0.9 % IR SOLN
Status: DC | PRN
  Administered 2011-12-18: 1

## 2011-12-18 MED ORDER — IBUPROFEN 600 MG PO TABS: 600.0000 mg | ORAL_TABLET | Freq: Four times a day (QID) | ORAL | Status: DC | PRN

## 2011-12-18 MED ORDER — METOCLOPRAMIDE HCL 5 MG/ML IJ SOLN: 10.0000 mg | Freq: Once | INTRAMUSCULAR | Status: DC | PRN

## 2011-12-18 MED ORDER — BUPIVACAINE HCL (PF) 0.5 % IJ SOLN
INTRAMUSCULAR | Status: AC
  Filled 2011-12-18: qty 30

## 2011-12-18 MED ORDER — MEPERIDINE HCL 25 MG/ML IJ SOLN: 6.2500 mg | INTRAMUSCULAR | Status: DC | PRN

## 2011-12-18 MED ORDER — PROPOFOL 10 MG/ML IV EMUL
INTRAVENOUS | Status: DC | PRN
  Administered 2011-12-18: 100 mg via INTRAVENOUS
  Administered 2011-12-18: 50 mg via INTRAVENOUS
  Administered 2011-12-18: 150 mg via INTRAVENOUS

## 2011-12-18 SURGICAL SUPPLY — 15 items
CLOTH BEACON ORANGE TIMEOUT ST (SAFETY) ×2 IMPLANT
CONTAINER PREFILL 10% NBF 60ML (FORM) ×4 IMPLANT
DRAPE CAMERA CLOSED 9X96 (DRAPES) IMPLANT
DRAPE HYSTEROSCOPY (DRAPE) ×2 IMPLANT
GLOVE BIO SURGEON STRL SZ7 (GLOVE) ×4 IMPLANT
GLOVE ECLIPSE 6.5 STRL STRAW (GLOVE) ×2 IMPLANT
GLOVE INDICATOR 6.5 STRL GRN (GLOVE) ×2 IMPLANT
GLOVE INDICATOR 7.0 STRL GRN (GLOVE) ×2 IMPLANT
GLOVE SURG SS PI 6.5 STRL IVOR (GLOVE) ×2 IMPLANT
GOWN PREVENTION PLUS LG XLONG (DISPOSABLE) ×4 IMPLANT
GOWN STRL REIN XL XLG (GOWN DISPOSABLE) ×2 IMPLANT
PACK VAGINAL MINOR WOMEN LF (CUSTOM PROCEDURE TRAY) ×2 IMPLANT
SET GENESYS HTA PROCERVA (MISCELLANEOUS) ×2 IMPLANT
TOWEL OR 17X24 6PK STRL BLUE (TOWEL DISPOSABLE) ×4 IMPLANT
WATER STERILE IRR 1000ML POUR (IV SOLUTION) ×2 IMPLANT

## 2011-12-18 NOTE — Op Note (Signed)
PREOPERATIVE DIAGNOSIS:  Menometrorrhagia, adenomyosis POSTOPERATIVE DIAGNOSIS: The same PROCEDURE: Dilation and Curettage, Hysteroscopy, Hydrothermal Endometrial Ablation SURGEON:  Dr. Jaynie Collins  INDICATIONS: 46 y.o. [id number] here for scheduled surgery for menometrorrhagia. Risks of surgery were discussed with the patient including but not limited to: bleeding which may require transfusion; infection which may require antibiotics; injury to uterus leading to risk of injury to surrounding intraperitoneal organs, burn injury to vagina or other organs, need for additional procedures including laparoscopy or laparotomy, and other postoperative/anesthesia complications. Patient was informed that there is a high likelihood of success of controlling her symptoms; however less than 5% of patients may require further intervention.  Written informed consent was obtained.    FINDINGS:  A 10 week size uterus.  Diffuse proliferative endometrium.  Normal ostia bilaterally.  ANESTHESIA:   General, paracervical block. INTRAVENOUS FLUIDS:  1000 ml of LR ESTIMATED BLOOD LOSS:  5 ml SPECIMENS: Endometrial curettings sent to pathology COMPLICATIONS:  None immediate.  PROCEDURE DETAILS:  The patient was then taken to the operating room where general anesthesia was administered and was found to be adequate.  After an adequate timeout was performed, she was placed in the dorsal lithotomy position and examined; then prepped and draped in the sterile manner.   Her bladder was catheterized for clear, yellow urine. A speculum was then placed in the patient's vagina and a single tooth tenaculum was applied to the anterior lip of the cervix.   A paracervical block using 30 ml of 0.5% Marcaine was administered.  The cervix was sounded to 10 cm and dilated manually with metal dilators to accommodate the hydrothermal ablation hysteroscopic apparatus.  Once the cervix was dilated, a sharp curettage was then performed to obtain a  moderate amount of endometrial curettings.  The hysteroscope was inserted under direct visualization using normal saline as a suspension medium.  The uterine cavity was carefully examined, both ostia were recognized, and diffusely proliferative endometrium was noted.   The hydrothermal ablation was then carried out as per protocol.   Complete ablation of the endometrium was observed and the hysteroscope was removed under direct visualization. .  No complications were observed.  The tenaculum was removed from the anterior lip of the cervix, and the vaginal speculum was removed after noting good hemostasis.  The patient tolerated the procedure well and was taken to the recovery area awake, extubated and in stable condition.  The patient will be discharged to home as per PACU criteria.  Routine postoperative instructions given.  She was prescribed Percocet, Ibuprofen and Colace.  She will follow up in the clinic on 01/16/12  for postoperative evaluation.

## 2011-12-18 NOTE — Anesthesia Preprocedure Evaluation (Addendum)
Anesthesia Evaluation  Patient identified by MRN, date of birth, ID band Patient awake    Reviewed: Allergy & Precautions, H&P , NPO status , Patient's Chart, lab work & pertinent test results, reviewed documented beta blocker date and time   History of Anesthesia Complications (+) PONV  Airway Mallampati: III TM Distance: >3 FB Neck ROM: Full    Dental No notable dental hx. (+) Teeth Intact   Pulmonary asthma , sleep apnea and Continuous Positive Airway Pressure Ventilation ,          Cardiovascular + dysrhythmias Regular Normal    Neuro/Psych  Headaches, Anxiety Depression    GI/Hepatic Neg liver ROS, GERD-  Medicated and Controlled,  Endo/Other  Negative Endocrine ROS  Renal/GU   Genitourinary negative   Musculoskeletal negative musculoskeletal ROS (+)   Abdominal   Peds  Hematology negative hematology ROS (+)   Anesthesia Other Findings Orthodontic appliances.  Reproductive/Obstetrics negative OB ROS                          Anesthesia Physical Anesthesia Plan  ASA: III  Anesthesia Plan: General   Post-op Pain Management:    Induction: Intravenous  Airway Management Planned: LMA  Additional Equipment:   Intra-op Plan:   Post-operative Plan: Extubation in OR  Informed Consent: I have reviewed the patients History and Physical, chart, labs and discussed the procedure including the risks, benefits and alternatives for the proposed anesthesia with the patient or authorized representative who has indicated his/her understanding and acceptance.   Dental advisory given  Plan Discussed with: Anesthesiologist and Surgeon  Anesthesia Plan Comments:         Anesthesia Quick Evaluation

## 2011-12-18 NOTE — Anesthesia Procedure Notes (Signed)
Procedure Name: LMA Insertion Date/Time: 12/18/2011 9:40 AM Performed by: Karleen Dolphin Pre-anesthesia Checklist: Patient identified, Timeout performed, Emergency Drugs available, Suction available and Patient being monitored Patient Re-evaluated:Patient Re-evaluated prior to inductionOxygen Delivery Method: Circle System Utilized Preoxygenation: Pre-oxygenation with 100% oxygen Intubation Type: IV induction Ventilation: Mask ventilation without difficulty LMA: LMA inserted and LMA with gastric port inserted LMA Size: 4.0 Number of attempts: 3 ( Unique 4 seal poor, repostionted, Supreme 4 insterted by Malen Gauze MD) Difficulty Due To: Difficulty was unanticipated

## 2011-12-18 NOTE — Addendum Note (Signed)
Addendum  created 12/18/11 1133 by Karleen Dolphin, CRNA   Modules edited:Charges VN

## 2011-12-18 NOTE — Transfer of Care (Signed)
Immediate Anesthesia Transfer of Care Note  Patient: [name]  Procedure(s) Performed: Procedure(s) (LRB): DILATATION & CURETTAGE/HYSTEROSCOPY WITH HYDROTHERMAL ABLATION (N/A)  Patient Location: PACU  Anesthesia Type: General  Level of Consciousness: awake, alert  and oriented  Airway & Oxygen Therapy: Patient Spontanous Breathing and Patient connected to nasal cannula oxygen  Post-op Assessment: Report given to PACU RN and Post -op Vital signs reviewed and stable  Post vital signs: Reviewed and stable  Complications: No apparent anesthesia complications

## 2011-12-18 NOTE — Anesthesia Postprocedure Evaluation (Signed)
  Anesthesia Post-op Note  Patient: [name]  Procedure(s) Performed: Procedure(s) (LRB): DILATATION & CURETTAGE/HYSTEROSCOPY WITH HYDROTHERMAL ABLATION (N/A)  Patient Location: PACU  Anesthesia Type: General  Level of Consciousness: awake, alert  and oriented  Airway and Oxygen Therapy: Patient Spontanous Breathing  Post-op Pain: none  Post-op Assessment: Post-op Vital signs reviewed, Patient's Cardiovascular Status Stable, Respiratory Function Stable, Patent Airway, No signs of Nausea or vomiting and Pain level controlled  Post-op Vital Signs: Reviewed and stable  Complications: No apparent anesthesia complications

## 2011-12-18 NOTE — H&P (Addendum)
Preoperative History and Physical  [name] is a 46 y.o. [id number] here for surgical management of menometrorrhagia   Proposed surgery: Hysteroscopy, D&C, Hydrothermal Endometrial Ablation  PMH:    Past Medical History  Diagnosis Date  . Asthma   . Anemia   . Migraine   . Anxiety   . Depression   . Termination of pregnancy     x 2  . PONV (postoperative nausea and vomiting)   . Dysrhythmia     Irregular heat w/ asthma episodes and anxiety  . Sleep apnea     uses CPAP  . GERD (gastroesophageal reflux disease)   . Chronic kidney disease     frequent urination- tx w/ vesicare    PSH:     Past Surgical History  Procedure Date  . Cesarean section     x3  . Mab     x 4  . Dilation and curettage of uterus 05/31/2005    MAB   . Tubal ligation 11/01/2006  . Leep   . Hernia repair 1990's  . Wisdom tooth extraction   . Mouth surgery     teeth extraction    POb/GynH:   OB History    Grav Para Term Preterm Abortions TAB SAB Ect Mult Living   8 4 4  0 4     4     Patient denies any recent cervical dysplasia or STIs.  Medications: Current facility-administered medications:lactated ringers infusion, , Intravenous, Continuous, Tereso Newcomer, MD, Last Rate: 125 mL/hr at 12/18/11 0838  Allergies:  Allergies  Allergen Reactions  . Advair Hfa     REACTION: mouth irritation  . Nitrofurantoin Rash  . Sulfamethoxazole W/Trimethoprim Rash    SH:   History  Substance Use Topics  . Smoking status: Former Smoker -- 0.2 packs/day for 15 years    Types: Cigarettes    Quit date: 02/16/2003  . Smokeless tobacco: Never Used  . Alcohol Use: Yes     socially    FH:    Family History  Problem Relation Age of Onset  . Diabetes Maternal Grandmother     Review of Systems: Noncontributory  PHYSICAL EXAM: Blood pressure 125/94, pulse 72, temperature 98.1 F (36.7 C), temperature source Oral, resp. rate 16, SpO2 100.00%. General appearance - alert, well appearing, and in no  distress Chest - clear to auscultation, no wheezes, rales or rhonchi, symmetric air entry Heart - normal rate and regular rhythm Abdomen - soft, nontender, nondistended, no masses or organomegaly Extremities - peripheral pulses normal, no pedal edema, no clubbing or cyanosis  Labs: Recent Results (from the past 336 hour(s))  CBC   Collection Time   12/11/11  4:25 PM      Component Value Range   WBC 5.5  4.0 - 10.5 (K/uL)   RBC 4.08  3.87 - 5.11 (MIL/uL)   Hemoglobin 12.8  12.0 - 15.0 (g/dL)   HCT 84.1  32.4 - 40.1 (%)   MCV 94.6  78.0 - 100.0 (fL)   MCH 31.4  26.0 - 34.0 (pg)   MCHC 33.2  30.0 - 36.0 (g/dL)   RDW 02.7  25.3 - 66.4 (%)   Platelets 311  150 - 400 (K/uL)  PREGNANCY, URINE   Collection Time   12/18/11  8:22 AM      Component Value Range   Preg Test, Ur NEGATIVE  NEGATIVE     Imaging Studies: 06/25/11 TRANSABDOMINAL AND TRANSVAGINAL ULTRASOUND OF PELVIS  Uterus: 10.0 x 7.0 x  5.4 cm. Diffusely inhomogeneous myometrium, without measurable focal abnormality. There is poor definition of the endometrial/myometrial interface. Endometrium: 3 mm. Uniformly echogenic and thin where visualized. Not well seen.  Right ovary: 2.7 x 1.8 x 1.7 cm. Normal. Left ovary: 2.8 x 2.3 x 1.8 cm. Normal. Other findings: No free fluid IMPRESSION: Poor definition of the myometrial/endometrial interface, which may be seen with adenomyosis. No focal abnormality. Thin endometrium measuring 3 mm may represent atrophy, which may present with vaginal bleeding.  Assessment: Patient Active Problem List  Diagnoses  . Adenomyosis  . HYPERLIPIDEMIA  . DEPRESSIVE DISORDER, NOS  . OBSTRUCTIVE SLEEP APNEA  . MIGRAINE, UNSPEC., W/O INTRACTABLE MIGRAINE  . RHINITIS, ALLERGIC  . ASTHMA, PERSISTENT, SEVERE  . GASTROESOPHAGEAL REFLUX, NO ESOPHAGITIS  . MENORRHAGIA  . HOT FLASHES  . FATIGUE  . CHEST PAIN  . MIXED INCONTINENCE URGE AND STRESS  . HEMORRHOIDS, EXTERNAL  . Gastroenteritis  . Dysuria  . Ear  pain    Plan: Hysteroscopy, D&C, Hydrothermal Endometrial Ablation. Risks of surgery were discussed with the patient including but not limited to: bleeding which may require transfusion; infection which may require antibiotics; injury to uterus leading to risk of injury to surrounding intraperitoneal organs, burn injury to vagina or other organs, need for additional procedures including laparoscopy or laparotomy, and other postoperative/anesthesia complications. Patient was informed that there is a high likelihood of success of controlling her symptoms; however less than 5% of patients may require further intervention.  Written informed consent was obtained.  To OR when ready.   Jaynie Collins, M.D. 12/18/2011 9:24 AM

## 2011-12-18 NOTE — Progress Notes (Signed)
12/18/11 1100  Clinical Encounter Type  Visited With Patient and family together (Mother Clara at bedside.)  Visit Type Psychological support;Spiritual support;Social support;Initial  Referral From Chaplain (Chaplain Ray Watlington in [place], where pt works.)  Spiritual Encounters  Spiritual Needs Emotional;Prayer    Per chaplain referral and pt request, visited with pt and her mother [name] for pastoral support and prayer in Short Stay prior to procedure.  Pt grateful for spiritual care of Chaplain Ray Watlington in [place], where pt works.  [name] is using scripture, faith, family support, and gratitude to cope with anxiety related to procedure.  She has self-care plan in place for recovery (visits, meals, movies, etc).  Provided spiritual and emotional support, encouragement, prayer at bedside.  Will follow up in PACU.  Pt aware of ongoing chaplain availability.  [name], [place] Chaplain [phone]

## 2011-12-18 NOTE — Discharge Instructions (Addendum)
Endometrial Ablation Endometrial ablation removes the lining of the uterus (endometrium). It is usually a same day, outpatient treatment. Ablation helps avoid major surgery (such as a hysterectomy). A hysterectomy is removal of the cervix and uterus. Endometrial ablation has less risk and complications, has a shorter recovery period and is less expensive. After endometrial ablation, most women will have little or no menstrual bleeding. You may not keep your fertility. Pregnancy is no longer likely after this procedure but if you are pre-menopausal, you still need to use a reliable method of birth control following the procedure because pregnancy can occur. REASONS TO HAVE THE PROCEDURE MAY INCLUDE:  Heavy periods.   Bleeding that is causing anemia.   Anovulatory bleeding, very irregular, bleeding.   Bleeding submucous fibroids (on the lining inside the uterus) if they are smaller than 3 centimeters.  REASONS NOT TO HAVE THE PROCEDURE MAY INCLUDE:  You wish to have more children.   You have a pre-cancerous or cancerous problem. The cause of any abnormal bleeding must be diagnosed before having the procedure.   You have pain coming from the uterus.   You have a submucus fibroid larger than 3 centimeters.   You recently had a baby.   You recently had an infection in the uterus.   You have a severe retro-flexed, tipped uterus and cannot insert the instrument to do the ablation.   You had a Cesarean section or deep major surgery on the uterus.   The inner cavity of the uterus is too large for the endometrial ablation instrument.  RISKS AND COMPLICATIONS   Perforation of the uterus.   Bleeding.   Infection of the uterus, bladder or vagina.   Injury to surrounding organs.   Cutting the cervix.   An air bubble to the lung (air embolus).   Pregnancy following the procedure.   Failure of the procedure to help the problem requiring hysterectomy.   Decreased ability to diagnose  cancer in the lining of the uterus.  BEFORE THE PROCEDURE  The lining of the uterus must be tested to make sure there is no pre-cancerous or cancer cells present.   Medications may be given to make the lining of the uterus thinner.   Ultrasound may be used to evaluate the size and look for abnormalities of the uterus.   Future pregnancy is not desired.  PROCEDURE  There are different ways to destroy the lining of the uterus.   Resectoscope - radio frequency-alternating electric current is the most common one used.   Cryotherapy - freezing the lining of the uterus.   Heated Free Liquid - heated salt (saline) solution inserted into the uterus.   Microwave - uses high energy microwaves in the uterus.   Thermal Balloon - a catheter with a balloon tip is inserted into the uterus and filled with heated fluid.  Your caregiver will talk with you about the method used in this clinic. They will also instruct you on the pros and cons of the procedure. Endometrial ablation is performed along with a procedure called operative hysteroscopy. A narrow viewing tube is inserted through the birth canal (vagina) and through the cervix into the uterus. A tiny camera attached to the viewing tube (hysteroscope) allows the uterine cavity to be shown on a TV monitor during surgery. Your uterus is filled with a harmless liquid to make the procedure easier. The lining of the uterus is then removed. The lining can also be removed with a resectoscope which allows your surgeon   to cut away the lining of the uterus under direct vision. Usually, you will be able to go home within an hour after the procedure. HOME CARE INSTRUCTIONS   Do not drive for 24 hours.   No tampons, douching or intercourse for 2 weeks or until your caregiver approves.   Rest at home for 24 to 48 hours. You may then resume normal activities unless told differently by your caregiver.   Take any medications your caregiver has ordered, as directed.    Use some form of contraception if you are pre-menopausal and do not want to get pregnant.  Bleeding after the procedure is normal. It varies from light spotting and mildly watery to bloody discharge for 4 to 6 weeks. You may also have mild cramping. Only take over-the-counter or prescription medicines for pain, discomfort, or fever as directed by your caregiver. Do not use aspirin, as this may aggravate bleeding. Frequent urination during the first 24 hours is normal. You will not know how effective your surgery is until at least 3 months after the surgery. SEEK IMMEDIATE MEDICAL CARE IF:   Bleeding is heavier than a normal menstrual cycle.   An oral temperature above 102 F (38.9 C) develops.   You have increasing cramps or pains not relieved with medication or develop belly (abdominal) pain which does not seem to be related to the same area of earlier cramping and pain.   You are light headed, weak or have fainting episodes.   You develop pain in the shoulder strap areas.   You have chest or leg pain.   You have abnormal vaginal discharge.   You have painful urination.  Document Released: 08/24/2004 Document Revised: 06/27/2011 Document Reviewed: 11/22/2007 ExitCare Patient Information 2012 ExitCare, LLCDISCHARGE INSTRUCTIONS: HYSTEROSCOPY / ENDOMETRIAL ABLATION The following instructions have been prepared to help you care for yourself upon your return home.  Personal hygiene: . Use sanitary pads for vaginal drainage, not tampons. . Shower the day after your procedure. . NO tub baths, pools or Jacuzzis for 2-3 weeks. . Wipe front to back after using the bathroom.  Activity and limitations: . Do NOT drive or operate any equipment for 24 hours. The effects of anesthesia are still present and drowsiness may result. . Do NOT rest in bed all day. . Walking is encouraged. . Walk up and down stairs slowly. . You may resume your normal activity in one to two days or as indicated  by your physician. Sexual activity: NO intercourse for at least 2 weeks after the procedure, or as indicated by your Doctor.  Diet: Eat a light meal as desired this evening. You may resume your usual diet tomorrow.  Return to Work: You may resume your work activities in one to two days or as indicated by your Doctor.  What to expect after your surgery: Expect to have vaginal bleeding/discharge for 2-3 days and spotting for up to 10 days. It is not unusual to have soreness for up to 1-2 weeks. You may have a slight burning sensation when you urinate for the first day. Mild cramps may continue for a couple of days. You may have a regular period in 2-6 weeks.  Call your doctor for any of the following: . Excessive vaginal bleeding or clotting, saturating and changing one pad every hour. . Inability to urinate 6 hours after discharge from hospital. . Pain not relieved by pain medication. . Fever of 100.4 F or greater. . Unusual vaginal discharge or odor.  Return to   office _________________Call for an appointment ___________________ Patient's signature: ______________________ Nurse's signature ________________________  Post Anesthesia Care Unit [phone] . 

## 2011-12-18 NOTE — Progress Notes (Signed)
12/18/11 1200  Clinical Encounter Type  Visited With Patient  Visit Type Follow-up;Spiritual support;Social support  Spiritual Encounters  Spiritual Needs Emotional    Made follow-up visit in PACU to check on patient, providing emotional support and encouragement.  Pt appreciative of visit, eager to rest and have quiet recreational time at home.  Avis Epley, [place] Chaplain [phone]

## 2012-01-02 ENCOUNTER — Ambulatory Visit (INDEPENDENT_AMBULATORY_CARE_PROVIDER_SITE_OTHER): Payer: 59 | Admitting: *Deleted

## 2012-01-02 ENCOUNTER — Telehealth: Payer: Self-pay | Admitting: *Deleted

## 2012-01-02 DIAGNOSIS — R319 Hematuria, unspecified: Secondary | ICD-10-CM

## 2012-01-02 LAB — POCT URINALYSIS DIP (DEVICE)
Bilirubin Urine: NEGATIVE
Nitrite: NEGATIVE
Protein, ur: NEGATIVE mg/dL
pH: 8.5 — ABNORMAL HIGH (ref 5.0–8.0)

## 2012-01-02 NOTE — Progress Notes (Signed)
Pt states lower back is hurting. No pain with urination. Pt only sees blood when wiping. Spoke with Dr. Marice Potter and advised to get urine culture.

## 2012-01-02 NOTE — Telephone Encounter (Signed)
Pt left message that she is noticing some bleeding after urination. She had surgery (endometrial ablation) on 12/18/11.  I returned pt call and left message on her personal voice mail. I stated that the bleeding may not be a problem, however, we should check a urinalysis today if possible. She may have the beginning of a bladder infection. Please call back to the appt line if she can come in today and arrange a time.

## 2012-01-05 LAB — URINE CULTURE

## 2012-01-12 ENCOUNTER — Encounter (HOSPITAL_COMMUNITY): Payer: Self-pay

## 2012-01-12 ENCOUNTER — Inpatient Hospital Stay (HOSPITAL_COMMUNITY)
Admission: AD | Admit: 2012-01-12 | Discharge: 2012-01-12 | Disposition: A | Discharge status: Home / Self Care | Payer: 59 | Source: Ambulatory Visit | Attending: Family Medicine | Admitting: Family Medicine

## 2012-01-12 DIAGNOSIS — N949 Unspecified condition associated with female genital organs and menstrual cycle: Secondary | ICD-10-CM | POA: Insufficient documentation

## 2012-01-12 DIAGNOSIS — M545 Low back pain, unspecified: Secondary | ICD-10-CM | POA: Insufficient documentation

## 2012-01-12 DIAGNOSIS — N938 Other specified abnormal uterine and vaginal bleeding: Secondary | ICD-10-CM | POA: Insufficient documentation

## 2012-01-12 DIAGNOSIS — N390 Urinary tract infection, site not specified: Secondary | ICD-10-CM | POA: Insufficient documentation

## 2012-01-12 LAB — URINALYSIS, ROUTINE W REFLEX MICROSCOPIC
Leukocytes, UA: NEGATIVE
Nitrite: NEGATIVE
Specific Gravity, Urine: 1.025 (ref 1.005–1.030)
pH: 6 (ref 5.0–8.0)

## 2012-01-12 MED ORDER — CEPHALEXIN 500 MG PO CAPS: 500.0000 mg | ORAL_CAPSULE | Freq: Four times a day (QID) | ORAL | Status: AC

## 2012-01-12 NOTE — MAU Note (Signed)
Pt states had ablation done on 2/19. Started seeing blood after urinating x1 week. States today bright red bleeding "oozing" out on a panty liner. States was checked for a UTI at the clinic last week but no one told her what the results were. Has intermittent lower abdominal pain that is relieved with motrin. Denies painful urination.

## 2012-01-12 NOTE — MAU Provider Note (Signed)
Chart reviewed and agree with management and plan.  

## 2012-01-12 NOTE — MAU Provider Note (Signed)
History     CSN: [id number]  Arrival date and time: 01/12/12 1124   First Provider Initiated Contact with Patient 01/12/12 1218      No chief complaint on file.  HPI [name] 46 y.o. History of uterine ablation in February.  This month has been having blood when she wipes.  Called GYN clinic and came to give a urine specimen on 01-02-12.  Never got a call about a UTI.  Bleeding has worsened and now is seeing blood on her pad.  Is very distressed as she does not want the bleeding to start again.  Tearful.  Denies fever but has low midline back pain - "under her kidneys".  OB History    Grav Para Term Preterm Abortions TAB SAB Ect Mult Living   8 4 4  0 4     4      Past Medical History  Diagnosis Date  . Asthma   . Anemia   . Migraine   . Anxiety   . Depression   . Termination of pregnancy     x 2  . PONV (postoperative nausea and vomiting)   . Dysrhythmia     Irregular heat w/ asthma episodes and anxiety  . Sleep apnea     uses CPAP  . GERD (gastroesophageal reflux disease)   . Chronic kidney disease     frequent urination- tx w/ vesicare    Past Surgical History  Procedure Date  . Cesarean section     x3  . Mab     x 4  . Dilation and curettage of uterus 05/31/2005    MAB   . Tubal ligation 11/01/2006  . Leep   . Hernia repair 1990's  . Wisdom tooth extraction   . Mouth surgery     teeth extraction  . Hysteroscopy w/ endometrial ablation     Family History  Problem Relation Age of Onset  . Diabetes Maternal Grandmother   . Anesthesia problems Neg Hx     History  Substance Use Topics  . Smoking status: Former Smoker -- 0.2 packs/day for 15 years    Types: Cigarettes    Quit date: 02/16/2003  . Smokeless tobacco: Never Used  . Alcohol Use: Yes     socially    Allergies:  Allergies  Allergen Reactions  . Advair Hfa Other (See Comments)    Thrush even with mouth rinsing; worsens cough  . Nitrofurantoin Rash  . Sulfamethoxazole W/Trimethoprim  Rash    Prescriptions prior to admission  Medication Sig Dispense Refill  . acetaminophen (TYLENOL) 500 MG tablet Take 1,000 mg by mouth every 6 (six) hours as needed. For pain      . albuterol (PROVENTIL HFA;VENTOLIN HFA) 108 (90 BASE) MCG/ACT inhaler Inhale 2 puffs into the lungs every 4 (four) hours as needed. For SOB      . albuterol (PROVENTIL) (2.5 MG/3ML) 0.083% nebulizer solution Take 2.5 mg by nebulization every 4 (four) hours as needed. 1 vail inhaled with neb machine. Disp 1 box  For SOB      . Ascorbic Acid (VITAMIN C PO) Take 1 tablet by mouth daily. Unknown OTC strength      . budesonide-formoterol (SYMBICORT) 160-4.5 MCG/ACT inhaler Inhale 2 puffs into the lungs 2 (two) times daily. With spacer for asthma control  3 Inhaler  3  . busPIRone (BUSPAR) 15 MG tablet Take 15 mg by mouth 2 (two) times daily. 1/2 tab 2x a day for 1 week then 1  tab 2x a day until follow up       . chlorproMAZINE (THORAZINE) 25 MG tablet Take 25 mg by mouth every 6 (six) hours as needed. For nausea/migraine      . citalopram (CELEXA) 40 MG tablet Take 40 mg by mouth daily.        Marland Kitchen esomeprazole (NEXIUM) 40 MG capsule Take 1 capsule (40 mg total) by mouth daily.  30 capsule  3  . fexofenadine (ALLEGRA) 180 MG tablet Take 180 mg by mouth every evening.        . fluticasone (FLONASE) 50 MCG/ACT nasal spray Place 2 sprays into the nose daily.  16 g  6  . ibuprofen (ADVIL,MOTRIN) 600 MG tablet Take 1 tablet (600 mg total) by mouth every 6 (six) hours as needed for pain. For pain  60 tablet  2  . montelukast (SINGULAIR) 10 MG tablet Take 1 tablet (10 mg total) by mouth at bedtime.  90 tablet  3  . ondansetron (ZOFRAN ODT) 4 MG disintegrating tablet Take 1 tablet (4 mg total) by mouth every 6 (six) hours as needed. For nausea  20 tablet  0  . Pyridoxine HCl (VITAMIN B-6 PO) Take 1 tablet by mouth daily. Unknown OTC strength      . solifenacin (VESICARE) 10 MG tablet Take 1 tablet (10 mg total) by mouth daily.  Unknown dose for bladder  90 tablet  3  . verapamil (CALAN) 40 MG tablet Take 40 mg by mouth as needed. When gets palpitations      . zolmitriptan (ZOMIG) 5 MG tablet Take 5 mg by mouth as needed. For migraine       . Spacer/Aero-Holding Chambers (AEROCHAMBER PLUS) inhaler 1 each by Other route once. Please provide patient with 1 spacer for use with her inhalers Use as instructed         Review of Systems  Constitutional: Negative for fever.  Gastrointestinal: Negative for nausea and vomiting.  Genitourinary: Positive for urgency and frequency. Negative for dysuria.  Musculoskeletal: Positive for back pain.   Physical Exam   Blood pressure 147/89, pulse 75, temperature 98.8 F (37.1 C), temperature source Oral, resp. rate 16.  Physical Exam  Nursing note and vitals reviewed. Constitutional: She is oriented to person, place, and time. She appears well-developed and well-nourished.  HENT:  Head: Normocephalic.  Eyes: EOM are normal.  Neck: Neck supple.  GI: Soft. There is no tenderness.  Genitourinary:       Speculum exam: Vulva - negative Vagina - Small amount of blood noted, no odor Cervix - No active bleeding Bimanual exam: Cervix closed Uterus very mildly tender,8-10 week size Adnexa non tender, no masses bilaterally No tenderness over bladder No CVA tenderness - having pain in low midline on back Chaperone present for exam.  Musculoskeletal: Normal range of motion.  Neurological: She is alert and oriented to person, place, and time.  Skin: Skin is warm and dry.  Psychiatric: She has a normal mood and affect.    MAU Course  Procedures  MDM Urine culture reviewed from 01-02-12.  >100,000 col E. Coli. Reviewed with client pain management - has medications at home  Assessment and Plan  UTI S/P uterine ablation  Plan Keep appointment in GYN clinic on Wed.  Discuss with MD plan for management of vaginal bleeding. rx keflex 500 mg po qid x 7 days for  uti   Harl Wiechmann 01/12/2012, 12:27 PM

## 2012-01-16 ENCOUNTER — Ambulatory Visit (INDEPENDENT_AMBULATORY_CARE_PROVIDER_SITE_OTHER): Payer: 59 | Admitting: Obstetrics & Gynecology

## 2012-01-16 ENCOUNTER — Encounter: Payer: Self-pay | Admitting: Obstetrics & Gynecology

## 2012-01-16 ENCOUNTER — Encounter: Payer: Self-pay | Admitting: *Deleted

## 2012-01-16 VITALS — BP 132/77 | HR 57 | Temp 98.2°F | Ht 63.0 in | Wt 163.7 lb

## 2012-01-16 DIAGNOSIS — Z09 Encounter for follow-up examination after completed treatment for conditions other than malignant neoplasm: Secondary | ICD-10-CM

## 2012-01-17 NOTE — Progress Notes (Signed)
  Subjective:     [name] is a 46 y.o. female who presents to the clinic 4 weeks status post  Dilation and Curettage, Hysteroscopy, Hydrothermal Endometrial Ablation for abnormal uterine bleeding. Eating a regular diet with difficulty. Bowel movements are normal. The patient is not having any pain.  She was diagnosed with an E.coli UTI recently and is undergoing treatment for this.  Patient was frustrated that she was not informed of her urine culture result in time, and had to find out after visiting the MAU. She is having brown discharge which is normal after an ablation, no heavy bleeding.  No other concerns.  The following portions of the patient's history were reviewed and updated as appropriate: allergies, current medications, past family history, past medical history, past social history, past surgical history and problem list. Last pap was on 05/24/11 and was normal.  Review of Systems Pertinent items are noted in HPI.    Objective:    BP 132/77  Pulse 57  Temp(Src) 98.2 F (36.8 C) (Oral)  Ht 5\' 3"  (1.6 m)  Wt 163 lb 11.2 oz (74.254 kg)  BMI 29.00 kg/m2 General:  alert and no distress  Abdomen: soft, bowel sounds active, non-tender  Pelvic:  Normal external genitalia, brown discharge seen, normal cervix.  Nontender uterus and adnexa   12/18/11 Endometrium, curettage - POLYPOID FRAGMENTS OF INACTIVE ENDOMETRIUM WITH PSEUDODECIDUALIZED STROMA, NO EVIDENCE OF ATYPIA, HYPERPLASIA OR MALIGNANCY. - BENIGN SQUAMOUS MUCOSA AND ENDOCERVICAL MUCOSA, NO DYSPLASIA ORMALIGNANCY. Assessment:    Postoperative course complicated by UTI, currently on Keflex Operative findings again reviewed. Pathology report discussed.    Plan:    1. Continue any current medications. 2. Pelvic rest restrictions lifted 3. Activity restrictions: none 4. Anticipated return to work: now. 5. Follow up: after 04/2012 for annual exam.

## 2012-02-25 ENCOUNTER — Other Ambulatory Visit: Payer: 59

## 2012-02-25 ENCOUNTER — Telehealth: Payer: Self-pay

## 2012-02-25 NOTE — Telephone Encounter (Signed)
Pt informed me that she is having urinary frequency and on Saturday she had bleeding when she urinated.  I asked if she could come in today so that we can get urine sample.  Pt informed me that she will try to get her to the clinics around 3pm.  I will inform front desk staff to look out.

## 2012-02-25 NOTE — Telephone Encounter (Signed)
Wrote in error

## 2012-02-25 NOTE — Telephone Encounter (Signed)
Pt called and stated that she thinks that she is having another UTI can a nurse call her back.

## 2012-04-23 ENCOUNTER — Encounter: Payer: Self-pay | Admitting: Family Medicine

## 2012-04-23 ENCOUNTER — Ambulatory Visit (INDEPENDENT_AMBULATORY_CARE_PROVIDER_SITE_OTHER): Payer: 59 | Admitting: Family Medicine

## 2012-04-23 ENCOUNTER — Other Ambulatory Visit (HOSPITAL_COMMUNITY)
Admission: RE | Admit: 2012-04-23 | Discharge: 2012-04-23 | Disposition: A | Discharge status: Home / Self Care | Payer: 59 | Source: Ambulatory Visit | Attending: Family Medicine | Admitting: Family Medicine

## 2012-04-23 VITALS — BP 143/88 | HR 87 | Temp 98.1°F | Ht 61.0 in | Wt 165.0 lb

## 2012-04-23 DIAGNOSIS — Z113 Encounter for screening for infections with a predominantly sexual mode of transmission: Secondary | ICD-10-CM | POA: Insufficient documentation

## 2012-04-23 DIAGNOSIS — R350 Frequency of micturition: Secondary | ICD-10-CM

## 2012-04-23 DIAGNOSIS — Z20828 Contact with and (suspected) exposure to other viral communicable diseases: Secondary | ICD-10-CM

## 2012-04-23 DIAGNOSIS — R358 Other polyuria: Secondary | ICD-10-CM

## 2012-04-23 DIAGNOSIS — Z2089 Contact with and (suspected) exposure to other communicable diseases: Secondary | ICD-10-CM

## 2012-04-23 DIAGNOSIS — R3589 Other polyuria: Secondary | ICD-10-CM | POA: Insufficient documentation

## 2012-04-23 DIAGNOSIS — Z202 Contact with and (suspected) exposure to infections with a predominantly sexual mode of transmission: Secondary | ICD-10-CM

## 2012-04-23 DIAGNOSIS — N898 Other specified noninflammatory disorders of vagina: Secondary | ICD-10-CM | POA: Insufficient documentation

## 2012-04-23 LAB — RPR

## 2012-04-23 LAB — POCT URINALYSIS DIPSTICK
Bilirubin, UA: NEGATIVE
Blood, UA: NEGATIVE
Glucose, UA: NEGATIVE
Ketones, UA: NEGATIVE
Leukocytes, UA: NEGATIVE
Nitrite, UA: NEGATIVE
Protein, UA: NEGATIVE
Spec Grav, UA: <=1.005
Urobilinogen, UA: 0.2
pH, UA: 6.5

## 2012-04-23 LAB — POCT WET PREP (WET MOUNT): WBC, Wet Prep HPF POC: <5

## 2012-04-23 NOTE — Patient Instructions (Signed)
Dear [name],   It was great to see you today. Thank you for coming to clinic. Please read below regarding the issues that we discussed.   1. I am unclear at this point what is causing you to pee more frequently. We are going to send a urine culture to make sure it is not a urinary tract infection.  2. I am also uncertain why you are having discharge. We are going to test you for gonorrhea and chlamydia.  3. We will also test you for HIV/syphilis today.   Please follow up in clinic as needed. Call sooner if you develop fevers, nausea, vomiting.  Please call earlier if you have any questions or concerns.   Sincerely,  Dr. Tana Conch

## 2012-04-23 NOTE — Progress Notes (Signed)
  Subjective:    Patient ID: [name], female    DOB: 12/13/1965, 46 y.o.   MRN: [id number]  HPI  1. Urinary symptoms-patient complains of urinary frequency for 2 weeks. She says she has tried to drink more water and drink cranberry juice as well but urgency continued. SHe denies, fevers, chills, abdominal pain, vomiting. Some slight nausea yesterday but has not recurred. States she had a UTI in February which was not treated for almost a month but was then treated when she presented to [facility]. States she had an endometrial biopsy in February (19th) and that she has had some vaginal dryness since that time.   2. Vaginal discharge-WIthin the last 3-4 days she has developed vaginal discharge with a foul smell. States cottage cheese like appearance.   Patient is on vesicare and has a history of mixed urge/stress incontinence. Uses kegel exercises.   Patient sexually active about 1-2 times a week and voids after sex. She does not use spermicide or diaphragm for protection. Active only with her fiancee but requests STD testing at this time as well.   Review of Systems -See HPI  Past Medical History-smoking status noted: former smoker. Reviewed problem list.  Medications- reviewed and updated Chief complaint-noted     Objective:   Physical Exam  Constitutional: She is oriented to person, place, and time. She appears well-developed and well-nourished.  Cardiovascular: Normal rate and regular rhythm.  Exam reveals no gallop and no friction rub.   No murmur heard. Pulmonary/Chest: Effort normal and breath sounds normal. She has no wheezes. She has no rales.  Abdominal: Soft. Bowel sounds are normal.  Genitourinary: No erythema around the vagina. No signs of injury around the vagina. Vaginal discharge (minimal thick white discharge) found.       No cervical motion tenderness.   Musculoskeletal: Normal range of motion. She exhibits no edema.  Neurological: She is alert and oriented to person,  place, and time.          Assessment & Plan:

## 2012-04-24 ENCOUNTER — Encounter: Payer: Self-pay | Admitting: Family Medicine

## 2012-04-24 LAB — HIV ANTIBODY (ROUTINE TESTING W REFLEX): HIV: NONREACTIVE

## 2012-04-24 NOTE — Assessment & Plan Note (Signed)
Urinalysis does not appear to have urinary infection. WIll obtain urine culture. Wet prep without yeast, clue cells. Will await urine culture. COuld be due to patient's urge incontinence but unclear why acutely worsened. No systemic sings of illness. GC/Chlamydia/HIV/RPR negative.   COncerning vaginal dryness-patient using lubrication. SHe may need to follow up with ob/gyn.

## 2012-04-25 ENCOUNTER — Telehealth: Payer: Self-pay | Admitting: Family Medicine

## 2012-04-25 LAB — URINE CULTURE

## 2012-04-25 NOTE — Telephone Encounter (Signed)
Attempted to reach patient at both work, home, cell #. Home and cell listed as same #. Unable to connect with home #. No answer on work phone.   Called to tell patient that urine culture did not show a urinary tract infection. Still unclear why patient having symptoms at most recent visit. Possible urge incontinence related urge/frequency. Patient to follow up with [place] or OB in a week if not improving.   Will ask RN to attempt to reach patient one more time.   Tana Conch, MD, PGY1 04/25/2012 11:33 AM

## 2012-04-28 NOTE — Telephone Encounter (Signed)
Will forward to white team, not sure who will take over pt issues for [place] at this time. [name], [name]

## 2012-04-28 NOTE — Telephone Encounter (Signed)
Spoke with patient and informed her with the information below. Also gave patient lab results requested from 6/26

## 2012-05-02 ENCOUNTER — Emergency Department (HOSPITAL_COMMUNITY)
Admission: EM | Admit: 2012-05-02 | Discharge: 2012-05-02 | Disposition: A | Discharge status: Home / Self Care | Payer: 59 | Attending: Emergency Medicine | Admitting: Emergency Medicine

## 2012-05-02 ENCOUNTER — Encounter (HOSPITAL_COMMUNITY): Payer: Self-pay | Admitting: Emergency Medicine

## 2012-05-02 DIAGNOSIS — F411 Generalized anxiety disorder: Secondary | ICD-10-CM | POA: Insufficient documentation

## 2012-05-02 DIAGNOSIS — N189 Chronic kidney disease, unspecified: Secondary | ICD-10-CM | POA: Insufficient documentation

## 2012-05-02 DIAGNOSIS — K219 Gastro-esophageal reflux disease without esophagitis: Secondary | ICD-10-CM | POA: Insufficient documentation

## 2012-05-02 DIAGNOSIS — Z833 Family history of diabetes mellitus: Secondary | ICD-10-CM | POA: Insufficient documentation

## 2012-05-02 DIAGNOSIS — G4730 Sleep apnea, unspecified: Secondary | ICD-10-CM | POA: Insufficient documentation

## 2012-05-02 DIAGNOSIS — H1030 Unspecified acute conjunctivitis, unspecified eye: Secondary | ICD-10-CM | POA: Insufficient documentation

## 2012-05-02 DIAGNOSIS — F3289 Other specified depressive episodes: Secondary | ICD-10-CM | POA: Insufficient documentation

## 2012-05-02 DIAGNOSIS — F329 Major depressive disorder, single episode, unspecified: Secondary | ICD-10-CM | POA: Insufficient documentation

## 2012-05-02 DIAGNOSIS — Z87891 Personal history of nicotine dependence: Secondary | ICD-10-CM | POA: Insufficient documentation

## 2012-05-02 DIAGNOSIS — J45909 Unspecified asthma, uncomplicated: Secondary | ICD-10-CM | POA: Insufficient documentation

## 2012-05-02 MED ORDER — TETRACAINE HCL 0.5 % OP SOLN
2.0000 [drp] | Freq: Once | OPHTHALMIC | Status: AC
  Administered 2012-05-02: 2 [drp] via OPHTHALMIC

## 2012-05-02 MED ORDER — KETOROLAC TROMETHAMINE 0.5 % OP SOLN
1.0000 [drp] | Freq: Once | OPHTHALMIC | Status: AC
  Administered 2012-05-02: 1 [drp] via OPHTHALMIC
  Filled 2012-05-02: qty 3

## 2012-05-02 MED ORDER — TOBRAMYCIN 0.3 % OP SOLN
2.0000 [drp] | Freq: Once | OPHTHALMIC | Status: AC
  Administered 2012-05-02: 2 [drp] via OPHTHALMIC
  Filled 2012-05-02: qty 5

## 2012-05-02 MED ORDER — TETRACAINE HCL 0.5 % OP SOLN
OPHTHALMIC | Status: AC
  Filled 2012-05-02: qty 2

## 2012-05-02 NOTE — ED Provider Notes (Signed)
History     CSN: [id number]  Arrival date & time 05/02/12  0408   First MD Initiated Contact with Patient 05/02/12 [phone]      Chief Complaint  Patient presents with  . Eye Pain    (Consider location/radiation/quality/duration/timing/severity/associated sxs/prior treatment) HPI Comments: [name] is a 46 y.o. Female with onset of eye, discomfort yesterday; worsening, this morning, when she came to work. As a burning sensation in both eyes. She's been using her contacts recently. No known eye trauma. Sick exposures at work. No recent fever, chills, nausea, vomiting. She's not tried medication for the problem yet. There are no alleviating factors. Open her eyes makes the discomfort, worse  The history is provided by the patient.    Past Medical History  Diagnosis Date  . Asthma   . Anemia   . Migraine   . Anxiety   . Depression   . Termination of pregnancy     x 2  . PONV (postoperative nausea and vomiting)   . Dysrhythmia     Irregular heat w/ asthma episodes and anxiety  . Sleep apnea     uses CPAP  . GERD (gastroesophageal reflux disease)   . Chronic kidney disease     frequent urination- tx w/ vesicare    Past Surgical History  Procedure Date  . Cesarean section     x3  . Mab     x 4  . Dilation and curettage of uterus 05/31/2005    MAB   . Tubal ligation 11/01/2006  . Leep   . Hernia repair 1990's  . Wisdom tooth extraction   . Mouth surgery     teeth extraction  . Hysteroscopy w/ endometrial ablation     Family History  Problem Relation Age of Onset  . Diabetes Maternal Grandmother   . Anesthesia problems Neg Hx     History  Substance Use Topics  . Smoking status: Former Smoker -- 0.2 packs/day for 15 years    Types: Cigarettes    Quit date: 02/16/2003  . Smokeless tobacco: Never Used  . Alcohol Use: Yes     socially    OB History    Grav Para Term Preterm Abortions TAB SAB Ect Mult Living   8 4 4  0 4     4      Review of Systems  All  other systems reviewed and are negative.    Allergies  Fluticasone-salmeterol; Nitrofurantoin; and Sulfamethoxazole w-trimethoprim  Home Medications   Current Outpatient Rx  Name Route Sig Dispense Refill  . ACETAMINOPHEN 500 MG PO TABS Oral Take 1,000 mg by mouth every 6 (six) hours as needed. For pain    . ALBUTEROL SULFATE HFA 108 (90 BASE) MCG/ACT IN AERS Inhalation Inhale 2 puffs into the lungs every 4 (four) hours as needed. For SOB    . ALBUTEROL SULFATE (2.5 MG/3ML) 0.083% IN NEBU Nebulization Take 2.5 mg by nebulization every 4 (four) hours as needed. 1 vail inhaled with neb machine. Disp 1 box  For SOB    . VITAMIN C PO Oral Take 1 tablet by mouth daily. Unknown OTC strength    . BUDESONIDE-FORMOTEROL FUMARATE 160-4.5 MCG/ACT IN AERO Inhalation Inhale 2 puffs into the lungs 2 (two) times daily. With spacer for asthma control 3 Inhaler 3  . BUSPIRONE HCL 15 MG PO TABS Oral Take 15 mg by mouth 2 (two) times daily. 1/2 tab 2x a day for 1 week then 1 tab 2x a day  until follow up    . CHLORPROMAZINE HCL 25 MG PO TABS Oral Take 25 mg by mouth every 6 (six) hours as needed. For nausea/migraine    . CITALOPRAM HYDROBROMIDE 40 MG PO TABS Oral Take 40 mg by mouth daily.      Marland Kitchen ESOMEPRAZOLE MAGNESIUM 40 MG PO CPDR Oral Take 1 capsule (40 mg total) by mouth daily. 30 capsule 3  . FEXOFENADINE HCL 180 MG PO TABS Oral Take 180 mg by mouth every evening.      Marland Kitchen FLUTICASONE PROPIONATE 50 MCG/ACT NA SUSP Nasal Place 2 sprays into the nose daily. 16 g 6  . IBUPROFEN 600 MG PO TABS Oral Take 1 tablet (600 mg total) by mouth every 6 (six) hours as needed for pain. For pain 60 tablet 2  . MONTELUKAST SODIUM 10 MG PO TABS Oral Take 1 tablet (10 mg total) by mouth at bedtime. 90 tablet 3  . ONDANSETRON 4 MG PO TBDP Oral Take 1 tablet (4 mg total) by mouth every 6 (six) hours as needed. For nausea 20 tablet 0  . VITAMIN B-6 PO Oral Take 1 tablet by mouth daily. Unknown OTC strength    . SOLIFENACIN  SUCCINATE 10 MG PO TABS Oral Take 1 tablet (10 mg total) by mouth daily. Unknown dose for bladder 90 tablet 3  . VERAPAMIL HCL 40 MG PO TABS Oral Take 40 mg by mouth as needed. When gets palpitations    . ZOLMITRIPTAN 5 MG PO TABS Oral Take 5 mg by mouth as needed. For migraine     . AEROCHAMBER PLUS MISC Other 1 each by Other route once. Please provide patient with 1 spacer for use with her inhalers Use as instructed       BP 137/97  Pulse 63  Temp 97.8 F (36.6 C) (Oral)  Resp 20  SpO2 97%  Physical Exam  Nursing note and vitals reviewed. Constitutional: She is oriented to person, place, and time. She appears well-developed and well-nourished.  HENT:  Head: Normocephalic and atraumatic.  Eyes: EOM are normal. Pupils are equal, round, and reactive to light.       Bilateral conjunctival erythema  Neck: Normal range of motion and phonation normal. Neck supple.  Cardiovascular: Normal rate.   Pulmonary/Chest: Effort normal. She exhibits no tenderness.  Musculoskeletal: Normal range of motion.  Neurological: She is alert and oriented to person, place, and time. She has normal strength. She exhibits normal muscle tone.  Skin: Skin is warm and dry.  Psychiatric: She has a normal mood and affect. Her behavior is normal. Judgment and thought content normal.    ED Course  Procedures (including critical care time)  Bilateral Eye Evaluation: Tetracaine anesthesia and fluorescein instilled into the eye. No corneal uptake of dye. Slit-lamp evaluation confirms no corneal defect. Anterior chamber is clear.  Medications begun in emergency department: Tobrex  and ketorolac eye drops  Labs Reviewed - No data to display No results found.   1. Conjunctivitis, acute       MDM  Evaluation is consistent with acute conjunctivitis, viral versus. Doubt eye trauma, as cause for the discomfort. Doubt metabolic instability, serious bacterial infection or impending vascular collapse; the patient is  stable for discharge.      Plan: Home Medications- Tobrex and Ketorolac drops; Home Treatments- rest; Recommended follow up- PCP prn  Flint Melter, MD 05/02/12 [phone]

## 2012-05-02 NOTE — Progress Notes (Signed)
Chaplain Note:  Chaplain provided spiritual comfort and support for pt.  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  After medical assessments were completed, chaplain assisted pt in making phone calls.  Pt expressed appreciation for chaplain support. Chaplain will follow up as needed.  05/02/12 0700  Clinical Encounter Type  Visited With Patient  Visit Type Spiritual support  Referral From Other (Comment) (Tziporah Knoke referral)  Spiritual Encounters  Spiritual Needs Emotional  Stress Factors  Patient Stress Factors Loss of control;Lack of caregivers   Verdie Shire, chaplain resident  [phone]

## 2012-05-02 NOTE — ED Notes (Signed)
PT reports both eyes hurting since this morning; tearful; and reporting extreme pain.

## 2012-05-06 ENCOUNTER — Ambulatory Visit (INDEPENDENT_AMBULATORY_CARE_PROVIDER_SITE_OTHER): Payer: 59 | Admitting: Family Medicine

## 2012-05-06 ENCOUNTER — Encounter: Payer: Self-pay | Admitting: Family Medicine

## 2012-05-06 VITALS — BP 120/81 | HR 78 | Temp 98.1°F | Ht 61.0 in | Wt 163.3 lb

## 2012-05-06 DIAGNOSIS — G43909 Migraine, unspecified, not intractable, without status migrainosus: Secondary | ICD-10-CM

## 2012-05-06 DIAGNOSIS — H543 Unqualified visual loss, both eyes: Secondary | ICD-10-CM

## 2012-05-06 MED ORDER — TRAMADOL HCL 50 MG PO TABS: 50.0000 mg | ORAL_TABLET | Freq: Three times a day (TID) | ORAL | Status: DC | PRN

## 2012-05-06 MED ORDER — KETOROLAC TROMETHAMINE 60 MG/2ML IM SOLN
60.0000 mg | Freq: Once | INTRAMUSCULAR | Status: AC
  Administered 2012-05-06: 60 mg via INTRAMUSCULAR

## 2012-05-06 NOTE — Progress Notes (Signed)
Subjective:     Patient ID: [name], female   DOB: 1966/06/18, 46 y.o.   MRN: [id number]  HPI Ms. Tetrault is a 46 y/o female with a history of migraines who comes to clinic today with a headache. She states that the headache began sometime last week, was severe on first day, but pain has been improving gradually.  She has a history of migraines, but this is her first migraine in over 3 years.   She does not think her currently prescribed medications are doing a good job of controlling it.   She states that the headache is essentially constant, and that there is significant pain that migrates around her head. She has tried taking her zolmitriptan but it has not helped. She also states that on Friday she started noticing changes in her vision. She reports she "felt like she went blind." She reports going to the ED where she was told she had conjunctivitis and was given eyedrops, but those have not seemed to help. This morning, the changes in her vision became so bad that she went to see an eye doctor who they gave her a new prescription for eye drops.  She is to return to eye doctor in one week.  She states that she is still having the headache, but denies associated symptoms. Specifically she denies fever, nausea, vomitng, diarrhea, cough, or shortness of breath.   Review of Systems As per above.     Objective:   Physical Exam General: Well appearing, no acute distress HEENT: EOMI, PERRLA, visual fields are normal, neck is supple, no lymphadenopathy CV: RRR, no murmurs, rubs, or gallops Pulm: Clear to auscultation bilaterally, normal effort with breathing Abd: Soft, non-tender Ext: No edema Neuro exam: CN 2-12 intact; 5/5 strength in all extremities; no sensory or motor deficits     Assessment:   46 y/o woman with a history of migraines presenting to clinic with worsening headaches and changes to her vision.   Plan:  1. Headaches: Painful headaches and visual fields are most consistent  with migraines. Will administer 60 mg of toradol in clinic today, and send her home with a prescription for tramadol. Per the patient, ophtho wishes to follow up with her in 7 days, so will defer management of visual symptoms until then.   2. Visual impairment: seems to be normal here in office, but will defer to optometrist who will see her again next week

## 2012-05-06 NOTE — Patient Instructions (Addendum)
It was nice to meet you today. Please return to clinic to have plantar wart removed. For headache, continue Ibuprofen and start taking Tramadol 50 mg every 8 hr as needed for pain.  Schedule follow up appointment with PCP in 1-2 weeks to discuss migraine prophylaxis. If you develop nausea/vomiting, fever, chills, worsening pain, please return to clinic or go to ED. Please follow up with your eye doctor for vision issues.  Migraine Headache A migraine headache is an intense, throbbing pain on one or both sides of your head. The exact cause of a migraine headache is not always known. A migraine may be caused when nerves in the brain become irritated and release chemicals that cause swelling within blood vessels, causing pain. Many migraine sufferers have a family history of migraines. Before you get a migraine you may or may not get an aura. An aura is a group of symptoms that can predict the beginning of a migraine. An aura may include:  Visual changes such as:   Flashing lights.   Bright spots or zig-zag lines.   Tunnel vision.   Feelings of numbness.   Trouble talking.   Muscle weakness.  SYMPTOMS  Pain on one or both sides of your head.   Pain that is pulsating or throbbing in nature.   Pain that is severe enough to prevent daily activities.   Pain that is aggravated by any daily physical activity.   Nausea (feeling sick to your stomach), vomiting, or both.   Pain with exposure to bright lights, loud noises, or activity.   General sensitivity to bright lights or loud noises.  MIGRAINE TRIGGERS Examples of triggers of migraine headaches include:   Alcohol.   Smoking.   Stress.   It may be related to menses (female menstruation).   Aged cheeses.   Foods or drinks that contain nitrates, glutamate, aspartame, or tyramine.   Lack of sleep.   Chocolate.   Caffeine.   Hunger.   Medications such as nitroglycerine (used to treat chest pain), birth control pills,  estrogen, and some blood pressure medications.  DIAGNOSIS  A migraine headache is often diagnosed based on:  Symptoms.   Physical examination.   A computerized X-ray scan (computed tomography, CT) of your head.  TREATMENT  Medications can help prevent migraines if they are recurrent or should they become recurrent. Your caregiver can help you with a medication or treatment program that will be helpful to you.   Lying down in a dark, quiet room may be helpful.   Keeping a headache diary may help you find a trend as to what may be triggering your headaches.  SEEK IMMEDIATE MEDICAL CARE IF:   You have confusion, personality changes or seizures.   You have headaches that wake you from sleep.   You have an increased frequency in your headaches.   You have a stiff neck.   You have a loss of vision.   You have muscle weakness.   You start losing your balance or have trouble walking.   You feel faint or pass out.  MAKE SURE YOU:   Understand these instructions.   Will watch your condition.   Will get help right away if you are not doing well or get worse.  Document Released: 10/15/2005 Document Revised: 10/04/2011 Document Reviewed: 05/31/2009 [facility] Patient Information 2012 [place], [place].

## 2012-05-06 NOTE — Assessment & Plan Note (Addendum)
Painful headaches and visual fields are most consistent with migraines. Will administer 60 mg of toradol in clinic today, and send her home with a prescription for tramadol. Per the patient, ophtho wishes to follow up with her in 7 days, so will defer management of visual symptoms until then.  Patient to follow up with PCP in 2 weeks to discuss migraine prophylaxis.  Red flags reviewed per AVS.

## 2012-05-06 NOTE — Assessment & Plan Note (Signed)
Likely a component of migraine, however she was seen by optometry today and given drops x 7 days.  She is to see them again i one week.

## 2012-05-15 ENCOUNTER — Telehealth: Payer: Self-pay | Admitting: Family Medicine

## 2012-05-15 NOTE — Telephone Encounter (Signed)
Returned call to patient to verify the names of the meds she needs refilled.  No answer at work or cell number.  Will try to call patient back later today.  Gaylene Brooks, RN

## 2012-05-15 NOTE — Telephone Encounter (Signed)
Was told by pharmacy to call here to get all her asthma/inhaler meds refilled.  States that the meds were out of date and needs this asap. OP Pharm

## 2012-05-15 NOTE — Telephone Encounter (Signed)
Called patient back, but no answer.  Will await call back from patient.  Gaylene Brooks, RN

## 2012-05-16 MED ORDER — MONTELUKAST SODIUM 10 MG PO TABS: 10.0000 mg | ORAL_TABLET | Freq: Every day | ORAL | Status: DC

## 2012-05-16 MED ORDER — FLUTICASONE PROPIONATE 50 MCG/ACT NA SUSP: 2.0000 | Freq: Every day | NASAL | Status: DC

## 2012-05-16 MED ORDER — FEXOFENADINE HCL 180 MG PO TABS: 180.0000 mg | ORAL_TABLET | Freq: Every evening | ORAL | Status: DC

## 2012-05-16 MED ORDER — BUDESONIDE-FORMOTEROL FUMARATE 160-4.5 MCG/ACT IN AERO: 2.0000 | INHALATION_SPRAY | Freq: Two times a day (BID) | RESPIRATORY_TRACT | Status: DC

## 2012-05-16 MED ORDER — ALBUTEROL SULFATE HFA 108 (90 BASE) MCG/ACT IN AERS: 2.0000 | INHALATION_SPRAY | RESPIRATORY_TRACT | Status: DC | PRN

## 2012-05-16 NOTE — Telephone Encounter (Signed)
Attempted to call patient & inform her to check with pharmacy for meds.  No answer at work or cell number.  Gaylene Brooks, RN

## 2012-05-16 NOTE — Telephone Encounter (Signed)
Patient called back and needs refills of Albuterol, Symbicort, Allegra, Flonase, and Singulair.   Will route note to Dr. Tye Savoy.  Gaylene Brooks, RN

## 2012-05-28 ENCOUNTER — Ambulatory Visit: Payer: 59 | Admitting: Family Medicine

## 2012-06-05 ENCOUNTER — Ambulatory Visit (INDEPENDENT_AMBULATORY_CARE_PROVIDER_SITE_OTHER): Payer: 59 | Admitting: Family Medicine

## 2012-06-05 ENCOUNTER — Ambulatory Visit (HOSPITAL_COMMUNITY)
Admission: RE | Admit: 2012-06-05 | Discharge: 2012-06-05 | Disposition: A | Discharge status: Home / Self Care | Payer: 59 | Source: Ambulatory Visit | Attending: Family Medicine | Admitting: Family Medicine

## 2012-06-05 ENCOUNTER — Encounter: Payer: Self-pay | Admitting: Family Medicine

## 2012-06-05 VITALS — BP 150/79 | HR 72 | Ht 61.0 in | Wt 161.9 lb

## 2012-06-05 DIAGNOSIS — R51 Headache: Secondary | ICD-10-CM

## 2012-06-05 DIAGNOSIS — W19XXXA Unspecified fall, initial encounter: Secondary | ICD-10-CM

## 2012-06-05 DIAGNOSIS — R55 Syncope and collapse: Secondary | ICD-10-CM | POA: Insufficient documentation

## 2012-06-05 DIAGNOSIS — G43909 Migraine, unspecified, not intractable, without status migrainosus: Secondary | ICD-10-CM

## 2012-06-05 MED ORDER — KETOROLAC TROMETHAMINE 60 MG/2ML IJ SOLN
60.0000 mg | Freq: Once | INTRAMUSCULAR | Status: AC
  Administered 2012-06-05: 60 mg via INTRAMUSCULAR

## 2012-06-05 NOTE — Progress Notes (Signed)
  Subjective:    Patient ID: [name], female    DOB: July 06, 1966, 46 y.o.   MRN: [id number]  HPI 1. Migraine:  Has had migraine for a couple of days.  Mainly focused over the right side of her head.  Feels like typical migraine, with mild photophobia.  Has zomig at home but this does not help much.  Received toradol injection previously which helped.  She denies any weakness, numbness, nausea. She also has FMLA paperwork that she needs completed for her chronic headache.  2. Fall:  Patient states that she had a fall this morning.  She was in the bathroom when she fell.  She states that she felt like she got too hot in the shower, felt lightheaded and fell.  Thinks she may have hit her face.  Headache ws going on prior to fall.   She denies any further dizziness, lightheadedness, palpitations, nausea, vomiting.    Review of Systems Per HPI    Objective:   Physical Exam  Constitutional: She is oriented to person, place, and time. She appears well-nourished. No distress.  HENT:  Head: Normocephalic and atraumatic.  Mouth/Throat: Oropharynx is clear and moist.  Eyes: Conjunctivae and EOM are normal. Pupils are equal, round, and reactive to light.  Neck: Neck supple.  Cardiovascular: Normal rate, regular rhythm and normal heart sounds.   Pulmonary/Chest: Effort normal and breath sounds normal.  Neurological: She is alert and oriented to person, place, and time. No cranial nerve deficit. Coordination normal.          Assessment & Plan:

## 2012-06-05 NOTE — Patient Instructions (Addendum)
It was nice to see you today Hopefully the medicine we gave you today will help with your migraine. If you have another episode of fainting please return.   I have placed the forms for your work in Dr. Sherron Flemings Cruz's box to be filled out.

## 2012-06-05 NOTE — Assessment & Plan Note (Signed)
Hx of migraines, seems like typical migraine today.  Given 60 mg of toradol in clinic today.  No neurological red flags today.  Will place FMLA form in Dr. Sherron Flemings Cruz's box since she is her new PCP.

## 2012-06-05 NOTE — Assessment & Plan Note (Signed)
Sounds to be possibly syncopal in nature, unsure of etiology,  symptoms have resolved at this point.  No neurological deficits, EKG normal. Instructed if this happens again to return.

## 2012-06-09 ENCOUNTER — Telehealth: Payer: Self-pay | Admitting: Family Medicine

## 2012-06-09 NOTE — Telephone Encounter (Signed)
LVM for patient to call back. Spoke  With patient's primary doctor and she stated that another doctor had seen this patient and that she will fill out these form, they were placed in her box.

## 2012-06-09 NOTE — Telephone Encounter (Signed)
Patient is calling to see if her FMLA paperwork is ready.

## 2012-06-09 NOTE — Telephone Encounter (Signed)
Form at front desk, ready for pick up.

## 2012-06-17 ENCOUNTER — Ambulatory Visit (INDEPENDENT_AMBULATORY_CARE_PROVIDER_SITE_OTHER): Payer: 59 | Admitting: Family Medicine

## 2012-06-17 ENCOUNTER — Encounter: Payer: Self-pay | Admitting: Family Medicine

## 2012-06-17 VITALS — BP 122/78 | HR 70 | Ht 61.0 in | Wt 163.0 lb

## 2012-06-17 DIAGNOSIS — R42 Dizziness and giddiness: Secondary | ICD-10-CM

## 2012-06-17 LAB — CBC
HCT: 37.5 % (ref 36.0–46.0)
MCHC: 35.2 g/dL (ref 30.0–36.0)
MCV: 90.8 fL (ref 78.0–100.0)
Platelets: 314 10*3/uL (ref 150–400)
RDW: 12.7 % (ref 11.5–15.5)
WBC: 5.5 10*3/uL (ref 4.0–10.5)

## 2012-06-17 NOTE — Progress Notes (Signed)
Subjective:     Patient ID: [name], female   DOB: 12-24-1965, 46 y.o.   MRN: [id number] Interviewed and examined with [facility].  Agree with edited note as below. HPI 46 yo female with Hx of migraine headaches present to clinic due hot feeling.  Yesterday morning she woke up with a mild headache and was getting ready for work when she suddenly got really hot and dizzy.  Pt states she became nauseous and felt like she was going to faint.  She reports seeing black dots.  Laying down in a dark rook, zofran, Zomig helped the pt to feel better.  She eventually took and nap and woke up feeling better except for a minor HA.  She has since had a constant minor HA. She recently had a similar episode in the shower where she overheated and became dizzy before fainting in the bathroom. Her EKG was normal at the time.  Unlikely to be pregnant as she had a tubal ligation procedure in 2008.   No numbness, tingling, weakness.  No vision changes. Review of Systems See HPI    Objective:   Physical Exam HEENT: EOM intact Lungs: clear to auscultation bilaterally, no wheezing or rhonci Cardio: RRR, no M/R/G Neuro: negative romberg, CBL testing normal. CN 2-12 intact.  Strength normal     Assessment:         Plan:

## 2012-06-17 NOTE — Patient Instructions (Addendum)
Keep a log of symptoms  Make sure you are drinking and eating regularly  Make a follow-up appointment with your primary doctor at your soonest convenience  Will check your thyroid and anemia today

## 2012-06-18 LAB — COMPREHENSIVE METABOLIC PANEL
ALT: 14 U/L (ref 0–35)
AST: 17 U/L (ref 0–37)
Alkaline Phosphatase: 34 U/L — ABNORMAL LOW (ref 39–117)
BUN: 11 mg/dL (ref 6–23)
Creat: 0.9 mg/dL (ref 0.50–1.10)
Potassium: 4.1 mEq/L (ref 3.5–5.3)

## 2012-06-18 LAB — TSH: TSH: 1.717 u[IU]/mL (ref 0.350–4.500)

## 2012-06-18 NOTE — Assessment & Plan Note (Addendum)
Intermittent flushed feeling.  Not orthostatic.  Normal ekg on previous evaluation.  No chest pain or dyspnea.  Self resolved.  Has some anxiety component.  Hisory not suggestive of neurological or cardiac cause.  Possible related to migraines.  Will check TSH, CBC, CMET today.  Advised her to stay well hydrated, eat regularly, and keep log of symtpoms.  Follow-up with PCP

## 2012-07-11 ENCOUNTER — Ambulatory Visit (INDEPENDENT_AMBULATORY_CARE_PROVIDER_SITE_OTHER): Payer: 59 | Admitting: Family Medicine

## 2012-07-11 ENCOUNTER — Encounter: Payer: Self-pay | Admitting: Family Medicine

## 2012-07-11 VITALS — BP 135/82 | HR 75 | Temp 99.1°F | Ht 61.0 in | Wt 162.1 lb

## 2012-07-11 DIAGNOSIS — G43909 Migraine, unspecified, not intractable, without status migrainosus: Secondary | ICD-10-CM

## 2012-07-11 MED ORDER — ZOLMITRIPTAN 5 MG PO TABS: 5.0000 mg | ORAL_TABLET | ORAL | Status: DC | PRN

## 2012-07-11 MED ORDER — PROPRANOLOL HCL ER 80 MG PO CP24: 80.0000 mg | ORAL_CAPSULE | Freq: Every day | ORAL | Status: DC

## 2012-07-11 NOTE — Patient Instructions (Addendum)
It was nice to finally meet you today, [name]. Please pick up Zomig and Inderal at the pharmacy and take as directed. For tension headaches, apply heating pads to the back of your neck. You may also want to consider monthly massages to loosen up your neck muscles. Follow up with me in 1-2 months or sooner as needed!  Recurrent Migraine Headache You have a recurrent migraine headache. The caregiver can usually provide good relief for this headache. If this headache is the same as your previous migraine headaches, it is safe to treat you without repeating a complete evaluation.  These headaches usually have at least two of the following problems:   They occur on one side of the head, pulsate, and are severe enough to prevent daily activities.   They are aggravated by daily physical activities.  You may have one or more of the following symptoms:   Nausea (feeling sick to your stomach).   Vomiting.   Pain with exposure to bright lights or loud noises.  Most headache sufferers have a family history of migraines. Your headaches may also be related to alcohol and smoking habits. Too much sleep, too little sleep, mood, and anxiety may also play a part. Changing some of these triggers may help you lower the number and level of pain of the headaches. Headaches may be related to menses (female menstruation). There are numerous medications that can prevent these headaches. Your caregiver can help you with a medication or regimen (procedure to follow). If this has been a chronic (long-term) condition, the use of long-term narcotics is not recommended. Using long-term narcotics can cause recurrent migraines. Narcotics are only a temporary measure only. They are used for the infrequent migraine that fails to respond to all other measures. SEEK MEDICAL CARE IF:   You do not get relief from the medications given to you.   You have a recurrence of pain.   This headache begins to differ from past migraine  (for example if it is more severe).  SEEK IMMEDIATE MEDICAL CARE IF:  You have a fever.   You have a stiff neck.   You have vision loss or have changes in vision.   You have problems with feeling lightheaded, become faint, or lose your balance.   You have muscular weakness.   You have loss of muscular control.   You develop severe symptoms different from your first symptoms.   You start losing your balance or have trouble walking.   You feel faint or pass out.  MAKE SURE YOU:   Understand these instructions.   Will watch your condition.   Will get help right away if you are not doing well or get worse.  Document Released: 07/10/2001 Document Revised: 10/04/2011 Document Reviewed: 06/03/2008 [facility] Patient Information 2012 [place], [place].

## 2012-07-13 ENCOUNTER — Encounter: Payer: Self-pay | Admitting: Family Medicine

## 2012-07-13 NOTE — Progress Notes (Signed)
  Subjective:    Patient ID: [name], female    DOB: 1966/02/15, 46 y.o.   MRN: [id number]  HPI  Patient presents to clinic to meet new doctor and follow up Hx of migraine headaches.  Patient has had migraines on and off for 2-3 years, but she was migraine free for about 1-2 years.  Lately (2 months ago), patient has been getting frequent headaches associated with dizziness.  She has been on Zomig, but she does not think this is helping anymore.  For flare ups, she takes Zomig and Excedrin.  She also complains of associated neck and shoulder pain after work.  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  Headaches mostly affect temporal lobes and at times, pain starts at her posterior neck and radiates up.  Patient does not have a headache today, but she would like refills on Zomig.  She denies any nausea vomiting, chills, NS.  She has a low grade temperature today 99.1.  She denies any cough, rhinorrhea, watery eyes.   Review of Systems  Per HPI    Objective:   Physical Exam  Filed Vitals:   07/11/12 1526  BP: 135/82  Pulse: 75  Temp: 99.1 F (37.3 C)   HEENT: NCAT.  PERRLA. Lungs: clear to auscultation bilaterally, no wheezing or rhonci Cardio: RRR, no M/R/G Neuro: CN 2-12 intact. Strength and sensation normal     Assessment & Plan:

## 2012-07-13 NOTE — Assessment & Plan Note (Addendum)
Patient with recurrent migraines for the last 2 months. Will refill Zomig 5 mg as needed, and start Inderal 80 LA daily to prophylaxis. Also recommended heating pads, Excedrin, and massages for tension headaches. Follow up with me in 1-2 months. If still no improvement, consider referral to [facility].

## 2012-09-01 ENCOUNTER — Ambulatory Visit (INDEPENDENT_AMBULATORY_CARE_PROVIDER_SITE_OTHER): Payer: 59 | Admitting: Obstetrics & Gynecology

## 2012-09-01 ENCOUNTER — Encounter: Payer: Self-pay | Admitting: Obstetrics & Gynecology

## 2012-09-01 VITALS — BP 130/93 | HR 71 | Temp 97.0°F | Ht 61.0 in | Wt 162.9 lb

## 2012-09-01 DIAGNOSIS — Z01419 Encounter for gynecological examination (general) (routine) without abnormal findings: Secondary | ICD-10-CM

## 2012-09-01 DIAGNOSIS — Z708 Other sex counseling: Secondary | ICD-10-CM

## 2012-09-01 LAB — POCT URINALYSIS DIP (DEVICE)
Bilirubin Urine: NEGATIVE
Glucose, UA: NEGATIVE mg/dL
Hgb urine dipstick: NEGATIVE
Leukocytes, UA: NEGATIVE
Nitrite: NEGATIVE

## 2012-09-01 NOTE — Patient Instructions (Addendum)
Sexually Transmitted Disease  Sexually transmitted disease (STD) refers to any infection that is passed from person to person during sexual activity. This may happen by way of saliva, semen, blood, vaginal mucus, or urine. Common STDs include:   Gonorrhea.   Chlamydia.   Syphilis.   HIV/AIDS.   Genital herpes.   Hepatitis B and C.   Trichomonas.   Human papillomavirus (HPV).   Pubic lice.  CAUSES   An STD may be spread by bacteria, virus, or parasite. A person can get an STD by:   Sexual intercourse with an infected person.   Sharing sex toys with an infected person.   Sharing needles with an infected person.   Having intimate contact with the genitals, mouth, or rectal areas of an infected person.  SYMPTOMS   Some people may not have any symptoms, but they can still pass the infection to others. Different STDs have different symptoms. Symptoms include:   Painful or bloody urination.   Pain in the pelvis, abdomen, vagina, anus, throat, or eyes.   Skin rash, itching, irritation, growths, or sores (lesions). These usually occur in the genital or anal area.   Abnormal vaginal discharge.   Penile discharge in men.   Soft, flesh-colored skin growths in the genital or anal area.   Fever.   Pain or bleeding during sexual intercourse.   Swollen glands in the groin area.   Yellow skin and eyes (jaundice). This is seen with hepatitis.  DIAGNOSIS   To make a diagnosis, your caregiver may:   Take a medical history.   Perform a physical exam.   Take a specimen (culture) to be examined.   Examine a sample of discharge under a microscope.   Perform blood tests.   Perform a Pap test, if this applies.   Perform a colposcopy.   Perform a laparoscopy.  TREATMENT    Chlamydia, gonorrhea, trichomonas, and syphilis can be cured with antibiotic medicine.   Genital herpes, hepatitis, and HIV can be treated, but not cured, with prescribed medicines. The medicines will lessen the symptoms.   Genital warts  from HPV can be treated with medicine or by freezing, burning (electrocautery), or surgery. Warts may come back.   HPV is a virus and cannot be cured with medicine or surgery.However, abnormal areas may be followed very closely by your caregiver and may be removed from the cervix, vagina, or vulva through office procedures or surgery.  If your diagnosis is confirmed, your recent sexual partners need treatment. This is true even if they are symptom-free or have a negative culture or evaluation. They should not have sex until their caregiver says it is okay.  HOME CARE INSTRUCTIONS   All sexual partners should be informed, tested, and treated for all STDs.   Take your antibiotics as directed. Finish them even if you start to feel better.   Only take over-the-counter or prescription medicines for pain, discomfort, or fever as directed by your caregiver.   Rest.   Eat a balanced diet and drink enough fluids to keep your urine clear or pale yellow.   Do not have sex until treatment is completed and you have followed up with your caregiver. STDs should be checked after treatment.   Keep all follow-up appointments, Pap tests, and blood tests as directed by your caregiver.   Only use latex condoms and water-soluble lubricants during sexual activity. Do not use petroleum jelly or oils.   Avoid alcohol and illegal drugs.   Get vaccinated   for HPV and hepatitis. If you have not received these vaccines in the past, talk to your caregiver about whether one or both might be right for you.   Avoid risky sex practices that can break the skin.  The only way to avoid getting an STD is to avoid all sexual activity.Latex condoms and dental dams (for oral sex) will help lessen the risk of getting an STD, but will not completely eliminate the risk.  SEEK MEDICAL CARE IF:    You have a fever.   You have any new or worsening symptoms.  Document Released: 01/05/2003 Document Revised: 01/07/2012 Document Reviewed:  01/12/2011  [employer] Patient Information 2013 [employer].

## 2012-09-01 NOTE — Progress Notes (Signed)
Patient ID: [name], female   DOB: [birth year], 46 y.o.   MRN: [id number] Subjective:     [name] is a 46 y.o. female here for a routine exam.  Current complaints:urinary frequency followed by urology. Wants full STI screen including blood work. Had h/o irreg bleeding which has improved since ablation.   Gynecologic History No LMP recorded. Patient has had an ablation. Contraception: tubal ligation Last Pap: 2012. Results were: normal per pt Last mammogram:2011. Results were:normal per pt.  Missed lat year due to accident  Obstetric History OB History    Grav Para Term Preterm Abortions TAB SAB Ect Mult Living   7 4 4  3 1 2   4      # Outc Date GA Lbr Len/2nd Wgt Sex Del Anes PTL Lv   1 TRM            2 TRM            3 TRM            4 TRM            5 TAB            6 SAB            7 SAB                The following portions of the patient's history were reviewed and updated as appropriate: allergies, current medications, past family history, past medical history, past social history, past surgical history and problem list.  Review of Systems A comprehensive review of systems was negative.    Objective:    BP 130/93  Pulse 71  Temp 97 F (36.1 C) (Oral)  Ht 5\' 1"  (1.549 m)  Wt 162 lb 14.4 oz (73.891 kg)  BMI 30.78 kg/m2  General Appearance:    Alert, cooperative, no distress, appears stated age  Head:    Normocephalic, without obvious abnormality, atraumatic     Ears:    Normal TM's and external ear canals, both ears  Nose:   Nares normal, septum midline, mucosa normal, no drainage    or sinus tenderness  Throat:   Lips, mucosa, and tongue normal; teeth and gums normal  Neck:   Supple, symmetrical, trachea midline, no adenopathy;    thyroid:  no enlargement/tenderness/nodules; no carotid   bruit or JVD  Back:     Symmetric, no curvature, ROM normal, no CVA tenderness  Lungs:     Clear to auscultation bilaterally, respirations unlabored  Chest Wall:     No tenderness or deformity   Heart:    Regular rate and rhythm, S1 and S2 normal, no murmur, rub   or gallop  Breast Exam:    No tenderness, masses, or nipple abnormality  Abdomen:     Soft, non-tender, bowel sounds active all four quadrants,    no masses, no organomegaly  Genitalia:    Normal female without lesion, discharge or tenderness  GU: EGBUS: no lesions Vagina: no blood in vault Cervix: no lesion; no mucopurulent d/c Uterus: small, mobile Adnexa: no masses; non tender       Extremities:   Extremities normal, atraumatic, no cyanosis or edema  Pulses:   2+ and symmetric all extremities  Skin:   Skin color, texture, turgor normal, no rashes or lesions  Lymph nodes:   Cervical, supraclavicular, and axillary nodes normal         Assessment:    Healthy female exam.  Normal annual GYN exam Wants STI screen Plan:    Follow up in: 1 year.   Mammogram- pt works for [employer] will schedule herself Labs: HIV, PRP, Heb B F/u PAP and cx  Arionna Hoggard L. Harraway-Smith, M.D., Evern Core

## 2012-09-02 LAB — HEPATITIS B SURFACE ANTIGEN: Hepatitis B Surface Ag: NEGATIVE

## 2012-09-02 LAB — HIV ANTIBODY (ROUTINE TESTING W REFLEX): HIV: NONREACTIVE

## 2012-09-02 LAB — RPR

## 2012-09-11 ENCOUNTER — Encounter: Payer: Self-pay | Admitting: Family Medicine

## 2012-09-11 ENCOUNTER — Ambulatory Visit (INDEPENDENT_AMBULATORY_CARE_PROVIDER_SITE_OTHER): Payer: 59 | Admitting: Family Medicine

## 2012-09-11 VITALS — BP 161/79 | HR 79 | Temp 97.7°F | Ht 61.0 in | Wt 150.0 lb

## 2012-09-11 DIAGNOSIS — F3289 Other specified depressive episodes: Secondary | ICD-10-CM

## 2012-09-11 DIAGNOSIS — G4700 Insomnia, unspecified: Secondary | ICD-10-CM

## 2012-09-11 DIAGNOSIS — F329 Major depressive disorder, single episode, unspecified: Secondary | ICD-10-CM

## 2012-09-11 MED ORDER — BUSPIRONE HCL 15 MG PO TABS: 15.0000 mg | ORAL_TABLET | Freq: Two times a day (BID) | ORAL | Status: DC

## 2012-09-11 MED ORDER — TRAZODONE HCL 50 MG PO TABS: 50.0000 mg | ORAL_TABLET | Freq: Every day | ORAL | Status: DC

## 2012-09-11 MED ORDER — CITALOPRAM HYDROBROMIDE 40 MG PO TABS: 40.0000 mg | ORAL_TABLET | Freq: Every day | ORAL | Status: DC

## 2012-09-11 NOTE — Patient Instructions (Addendum)
Start Celexa 40 mg daily, Buspar 15 mg twice a day for anxiety. For sleep, take Trazodone at bedtime as needed for insomnia. Continue to see your counselor as needed. Schedule follow up appointment in 2-3 weeks.

## 2012-09-13 ENCOUNTER — Encounter: Payer: Self-pay | Admitting: Family Medicine

## 2012-09-13 DIAGNOSIS — G4700 Insomnia, unspecified: Secondary | ICD-10-CM | POA: Insufficient documentation

## 2012-09-13 NOTE — Assessment & Plan Note (Signed)
Trazodone at bedtime as needed for insomnia.

## 2012-09-13 NOTE — Progress Notes (Signed)
  Subjective:    Patient ID: [name], female    DOB: 22-Nov-1965, 46 y.o.   MRN: [id number]  HPI  Insomnia: Patient complains of difficulty sleeping, started 1-2 months ago.  She has a hx of OSA and has been compliant with CPAP.  However, she wakes up at 1 AM every night and cannot go back to sleep.  Patient denies any caffeine use.  She denies any naps throughout the day.  Stress level has been elevated since she left her boyfriend several weeks ago.  Since then, she says her thoughts have been racing and keep her awake at night.  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  Anxiety/Depression: Patient has a long hx of anxiety/depression diagnosed by previous PCP.  She was taking Celexa daily, but stopped this several weeks ago because she said it was not working anymore.  She is also supposed to be taking Buspar BID, but she takes it daily.  Patient asking for a medication to help calm her down.  She has been going to counseling through [facility].  Her counselor helped her realize that she needed to leave her BF, but now patient feels lonely and is not used to living/sleeping alone.  Patient denies any alcohol or drug use.  Denies family hx of bipolar disorder.   Review of Systems  Denies SI/HI.    Objective:   Physical Exam  Constitutional: She appears well-nourished. No distress.  Psychiatric: Her speech is normal. Her mood appears anxious. She is agitated. She expresses no homicidal and no suicidal ideation.       Assessment & Plan:

## 2012-09-13 NOTE — Assessment & Plan Note (Addendum)
Patient's symptoms likely secondary to stopping Celexa abruptly vs. Recent stressors.  Mood Questionnaire: Yes - 9 - Restart Celexa 40 mg daily, Buspar 15 mg twice a day for anxiety. - Continue to see your counselor. - Schedule follow up appointment in 2-3 weeks.

## 2012-10-08 ENCOUNTER — Ambulatory Visit (INDEPENDENT_AMBULATORY_CARE_PROVIDER_SITE_OTHER): Payer: 59 | Admitting: Family Medicine

## 2012-10-08 ENCOUNTER — Encounter: Payer: Self-pay | Admitting: Family Medicine

## 2012-10-08 VITALS — BP 139/80 | HR 72 | Temp 98.7°F | Wt 160.0 lb

## 2012-10-08 DIAGNOSIS — M25569 Pain in unspecified knee: Secondary | ICD-10-CM

## 2012-10-08 DIAGNOSIS — M25561 Pain in right knee: Secondary | ICD-10-CM

## 2012-10-08 DIAGNOSIS — G43909 Migraine, unspecified, not intractable, without status migrainosus: Secondary | ICD-10-CM

## 2012-10-08 MED ORDER — DICLOFENAC SODIUM 1 % TD GEL: 2.0000 g | Freq: Four times a day (QID) | TRANSDERMAL | Status: DC

## 2012-10-08 MED ORDER — DULOXETINE HCL 20 MG PO CPEP: 20.0000 mg | ORAL_CAPSULE | Freq: Every day | ORAL | Status: DC

## 2012-10-08 MED ORDER — TRAMADOL HCL 50 MG PO TABS: 50.0000 mg | ORAL_TABLET | Freq: Three times a day (TID) | ORAL | Status: DC | PRN

## 2012-10-08 NOTE — Patient Instructions (Addendum)
It was great to see you.  I am sorry your knee is hurting. Continue to ice it, elevate at rest, and wear a knee sleeve while working. For pain, take Tramadol 50 mg every 8 hours as needed for pain and rub Voltaren gel four times per day as needed. Do this for 2 weeks.  If no improvement in 2 weeks, please return to clinic. If you develop worsening knee pain, unable to bear weight on it, please call your doctor.  Knee Pain Knee pain can be a result of an injury or other medical conditions. Treatment will depend on the cause of your pain. HOME CARE  Only take medicine as told by your doctor.  Keep a healthy weight. Being overweight can make the knee hurt more.  Stretch before exercising or playing sports.  If there is constant knee pain, change the way you exercise. Ask your doctor for advice.  Make sure shoes fit well. Choose the right shoe for the sport or activity.  Protect your knees. Wear kneepads if needed.  Rest when you are tired. GET HELP RIGHT AWAY IF:   Your knee pain does not stop.  Your knee pain does not get better.  Your knee joint feels hot to the touch.  You have a temperature by mouth above 102 F (38.9 C), not controlled by medicine.  Your baby is older than 3 months with a rectal temperature of 102 F (38.9 C) or higher.  Your baby is 29 months old or younger with a rectal temperature of 100.4 F (38 C) or higher. MAKE SURE YOU:   Understand these instructions.  Will watch this condition.  Will get help right away if you are not doing well or get worse. Document Released: 01/11/2009 Document Revised: 01/07/2012 Document Reviewed: 01/11/2009 [facility] Patient Information 2013 [place], [place].

## 2012-10-09 ENCOUNTER — Encounter: Payer: Self-pay | Admitting: Family Medicine

## 2012-10-09 DIAGNOSIS — M25561 Pain in right knee: Secondary | ICD-10-CM | POA: Insufficient documentation

## 2012-10-09 DIAGNOSIS — M25562 Pain in left knee: Secondary | ICD-10-CM | POA: Insufficient documentation

## 2012-10-09 MED ORDER — KETOROLAC TROMETHAMINE 60 MG/2ML IM SOLN
60.0000 mg | Freq: Once | INTRAMUSCULAR | Status: AC
  Administered 2012-10-09: 60 mg via INTRAMUSCULAR

## 2012-10-09 NOTE — Progress Notes (Signed)
  Subjective:    Patient ID: [name], female    DOB: 06-30-1966, 46 y.o.   MRN: [id number]  HPI  Patient presents to clinic for RT knee pain and swelling. Acute on chronic issue, patient says she was told she has arthritis in the past. Symptoms started this summer and have been on and off since then. She has tried using a knee sleeve at work and Motrin PRN, but no improvement. Last night, pain kept her awake all night. 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 Pain will typically last for 2-3 days then resolve, then return. Pain is mild to moderate today, only in RT knee. Able to bear weight on it without limping.  Denies any associated fever, chills, NS.  Denies any numbness or tingling of extremities.   Review of Systems  Per HPI    Objective:   Physical Exam  Constitutional: She appears well-nourished. No distress.  Musculoskeletal:       RT Knee: RT knee does appear more swollen that LT, but no evidence of erythema or effusion. Palpation normal with no warmth or joint line tenderness or patellar tenderness or condyle tenderness. + cracking sounds appreciated with anterior and posterior drawer tests on RT and LT knee ROM normal in flexion and extension and lower leg rotation. Ligaments with solid consistent endpoints including ACL, PCL, LCL, MCL. Normal gait, able to bear weight on both LE.  Skin: Skin is warm. No rash noted. No erythema.       Assessment & Plan:

## 2012-10-09 NOTE — Assessment & Plan Note (Signed)
Knee pain likely secondary from strain/sprain from overuse at work.  Patient also has been told she has arthritis by a previous MD. - Will treat conservatively with RICE and knee sleeve for now - Tramadol 50 mg every 8 hours PRN pain + Voltaren gel four times per day for 2 weeks - If no improvement in 2 weeks, please return to clinic, consider referral to [facility] or PT

## 2012-10-13 ENCOUNTER — Other Ambulatory Visit: Payer: Self-pay | Admitting: Family Medicine

## 2012-10-30 ENCOUNTER — Other Ambulatory Visit: Payer: Self-pay | Admitting: Family Medicine

## 2012-10-30 DIAGNOSIS — Z1231 Encounter for screening mammogram for malignant neoplasm of breast: Secondary | ICD-10-CM

## 2012-10-31 ENCOUNTER — Ambulatory Visit (INDEPENDENT_AMBULATORY_CARE_PROVIDER_SITE_OTHER): Payer: 59 | Admitting: Family Medicine

## 2012-10-31 ENCOUNTER — Telehealth: Payer: Self-pay | Admitting: Family Medicine

## 2012-10-31 ENCOUNTER — Encounter: Payer: Self-pay | Admitting: Family Medicine

## 2012-10-31 VITALS — BP 166/76 | HR 93 | Temp 98.8°F | Wt 162.0 lb

## 2012-10-31 DIAGNOSIS — M25569 Pain in unspecified knee: Secondary | ICD-10-CM

## 2012-10-31 DIAGNOSIS — M25561 Pain in right knee: Secondary | ICD-10-CM

## 2012-10-31 DIAGNOSIS — F329 Major depressive disorder, single episode, unspecified: Secondary | ICD-10-CM

## 2012-10-31 MED ORDER — MONTELUKAST SODIUM 10 MG PO TABS: 10.0000 mg | ORAL_TABLET | Freq: Every day | ORAL | Status: DC

## 2012-10-31 MED ORDER — PROPRANOLOL HCL ER 80 MG PO CP24: 80.0000 mg | ORAL_CAPSULE | Freq: Every day | ORAL | Status: DC

## 2012-10-31 MED ORDER — BUSPIRONE HCL 15 MG PO TABS: 15.0000 mg | ORAL_TABLET | Freq: Two times a day (BID) | ORAL | Status: DC

## 2012-10-31 MED ORDER — FLUTICASONE PROPIONATE 50 MCG/ACT NA SUSP: 2.0000 | Freq: Every day | NASAL | Status: DC

## 2012-10-31 MED ORDER — FEXOFENADINE HCL 180 MG PO TABS: 180.0000 mg | ORAL_TABLET | Freq: Every evening | ORAL | Status: DC

## 2012-10-31 MED ORDER — DULOXETINE HCL 20 MG PO CPEP: 20.0000 mg | ORAL_CAPSULE | Freq: Every day | ORAL | Status: DC

## 2012-10-31 MED ORDER — BENZONATATE 200 MG PO CAPS: 200.0000 mg | ORAL_CAPSULE | Freq: Two times a day (BID) | ORAL | Status: DC | PRN

## 2012-10-31 MED ORDER — BUDESONIDE-FORMOTEROL FUMARATE 160-4.5 MCG/ACT IN AERO: 2.0000 | INHALATION_SPRAY | Freq: Two times a day (BID) | RESPIRATORY_TRACT | Status: DC

## 2012-10-31 MED ORDER — ALBUTEROL SULFATE (2.5 MG/3ML) 0.083% IN NEBU: 2.5000 mg | INHALATION_SOLUTION | RESPIRATORY_TRACT | Status: DC | PRN

## 2012-10-31 MED ORDER — CHLORPROMAZINE HCL 25 MG PO TABS: 25.0000 mg | ORAL_TABLET | Freq: Four times a day (QID) | ORAL | Status: DC | PRN

## 2012-10-31 MED ORDER — ALBUTEROL SULFATE HFA 108 (90 BASE) MCG/ACT IN AERS: 2.0000 | INHALATION_SPRAY | RESPIRATORY_TRACT | Status: DC | PRN

## 2012-10-31 MED ORDER — ESOMEPRAZOLE MAGNESIUM 40 MG PO CPDR: 40.0000 mg | DELAYED_RELEASE_CAPSULE | Freq: Every day | ORAL | Status: DC

## 2012-10-31 NOTE — Assessment & Plan Note (Addendum)
Has improved mood and anxiety with Cymbalta and Buspar regimen.

## 2012-10-31 NOTE — Telephone Encounter (Signed)
Patient was just in and saw Dr. Tye Savoy and she thought that she was going to be prescribed something for her cough, but in the list of meds, there wasn't anything and she wanted to check into this.  She would really like this fixed before the weekend.

## 2012-10-31 NOTE — Patient Instructions (Addendum)
Schedule a follow up appointment 3 months or sooner as needed. All prescriptions were sent to your pharmacy.

## 2012-10-31 NOTE — Assessment & Plan Note (Signed)
Resolved for now.  Pain well controlled with Tramadol PRN.

## 2012-10-31 NOTE — Progress Notes (Signed)
  Subjective:    Patient ID: [name], female    DOB: [birth year], 47 y.o.   MRN: [id number]  HPI  Patient presents to clinic for medication refills.  She also wanted to let me know that knee pain has resolved since last month.  She took Tramadol and swelling and pain decreased.  She did not pick up Voltaren gel due to cost.  Patient states "I have arthritis" because pain comes and goes and worsens with cold weather and rain.  Denies any limited ROM, joint swelling, numbness/tingling at this time.  Patient also tells me that anxiety is better controlled on Cymbalta and Buspar.  She did not like Celexa because it caused stomach burning.  She has been taking Cymbalta for about 4 weeks and says that she is much more calm.  She continues to see a therapist and is learning now to avoid people who make her angry and other triggers.    Review of Systems  Per HPI    Objective:   Physical Exam  Constitutional: She appears well-nourished. No distress.  Neurological: Gait normal.  Psychiatric: She has a normal mood and affect. Her speech is normal and behavior is normal. Thought content normal.      Assessment & Plan:

## 2012-11-03 ENCOUNTER — Telehealth: Payer: Self-pay | Admitting: Family Medicine

## 2012-11-03 NOTE — Telephone Encounter (Signed)
Patient is asking for Archie Patten to call her about getting a new OB/GYN.

## 2012-11-04 ENCOUNTER — Encounter: Payer: Self-pay | Admitting: Family Medicine

## 2012-11-10 NOTE — Telephone Encounter (Signed)
Called and informed pt of Dr. Cherly Hensen  [address] [place], [place] [address]  [phone] .Loralee Pacas [place]

## 2012-11-28 ENCOUNTER — Ambulatory Visit
Admission: RE | Admit: 2012-11-28 | Discharge: 2012-11-28 | Disposition: A | Discharge status: Home / Self Care | Payer: 59 | Source: Ambulatory Visit | Attending: Family Medicine | Admitting: Family Medicine

## 2012-11-28 DIAGNOSIS — Z1231 Encounter for screening mammogram for malignant neoplasm of breast: Secondary | ICD-10-CM

## 2012-12-04 ENCOUNTER — Encounter: Payer: Self-pay | Admitting: Obstetrics & Gynecology

## 2012-12-04 ENCOUNTER — Ambulatory Visit (INDEPENDENT_AMBULATORY_CARE_PROVIDER_SITE_OTHER): Payer: 59 | Admitting: Obstetrics & Gynecology

## 2012-12-04 VITALS — BP 135/86 | HR 74 | Temp 97.6°F | Ht 61.0 in | Wt 160.9 lb

## 2012-12-04 DIAGNOSIS — N949 Unspecified condition associated with female genital organs and menstrual cycle: Secondary | ICD-10-CM

## 2012-12-04 DIAGNOSIS — R102 Pelvic and perineal pain: Secondary | ICD-10-CM

## 2012-12-04 MED ORDER — TRAMADOL HCL 50 MG PO TABS: 50.0000 mg | ORAL_TABLET | Freq: Three times a day (TID) | ORAL | Status: DC | PRN

## 2012-12-04 MED ORDER — DICLOFENAC SODIUM 75 MG PO TBEC: 75.0000 mg | DELAYED_RELEASE_TABLET | Freq: Two times a day (BID) | ORAL | Status: DC

## 2012-12-04 NOTE — Patient Instructions (Signed)
Pelvic Pain, Female Female pelvic pain can be caused by many different things and start from a variety of places. Pelvic pain refers to pain that is located in the lower half of the abdomen and between your hips. The pain may occur over a short period of time (acute) or may be reoccurring (chronic). The cause of pelvic pain may be related to disorders affecting the female reproductive organs (gynecologic), but it may also be related to the bladder, kidney stones, an intestinal complication, or muscle or skeletal problems. Getting help right away for pelvic pain is important, especially if there has been severe, sharp, or a sudden onset of unusual pain. It is also important to get help right away because some types of pelvic pain can be life threatening.  CAUSES  Below are only some of the causes of pelvic pain. The causes of pelvic pain can be in one of several categories.   Gynecologic.  Pelvic inflammatory disease.  Sexually transmitted infection.  Ovarian cyst or a twisted ovarian ligament (ovarian torsion).  Uterine lining that grows outside the uterus (endometriosis).  Fibroids, cysts, or tumors.  Ovulation.  Pregnancy.  Pregnancy that occurs outside the uterus (ectopic pregnancy).  Miscarriage.  Labor.  Abruption of the placenta or ruptured uterus.  Infection.  Uterine infection (endometritis).  Bladder infection.  Diverticulitis.  Miscarriage related to a uterine infection (septic abortion).  Bladder.  Inflammation of the bladder (cystitis).  Kidney stone(s).  Gastrointenstinal.  Constipation.  Diverticulitis.  Neurologic.  Trauma.  Feeling pelvic pain because of mental or emotional causes (psychosomatic).  Cancers of the bowel or pelvis. EVALUATION  Your caregiver will want to take a careful history of your concerns. This includes recent changes in your health, a careful gynecologic history of your periods (menses), and a sexual history. Obtaining  your family history and medical history is also important. Your caregiver may suggest a pelvic exam. A pelvic exam will help identify the location and severity of the pain. It also helps in the evaluation of which organ system may be involved. In order to identify the cause of the pelvic pain and be properly treated, your caregiver may order tests. These tests may include:   A pregnancy test.  Pelvic ultrasonography.  An X-ray exam of the abdomen.  A urinalysis or evaluation of vaginal discharge.  Blood tests. HOME CARE INSTRUCTIONS   Only take over-the-counter or prescription medicines for pain, discomfort, or fever as directed by your caregiver.   Rest as directed by your caregiver.   Eat a balanced diet.   Drink enough fluids to make your urine clear or pale yellow, or as directed.   Avoid sexual intercourse if it causes pain.   Apply warm or cold compresses to the lower abdomen depending on which one helps the pain.   Avoid stressful situations.   Keep a journal of your pelvic pain. Write down when it started, where the pain is located, and if there are things that seem to be associated with the pain, such as food or your menstrual cycle.  Follow up with your caregiver as directed.  SEEK MEDICAL CARE IF:  Your medicine does not help your pain.  You have abnormal vaginal discharge. SEEK IMMEDIATE MEDICAL CARE IF:   You have heavy bleeding from the vagina.   Your pelvic pain increases.   You feel lightheaded or faint.   You have chills.   You have pain with urination or blood in your urine.   You have uncontrolled   diarrhea or vomiting.   You have a fever or persistent symptoms for more than 3 days.  You have a fever and your symptoms suddenly get worse.   You are being physically or sexually abused.  MAKE SURE YOU:  Understand these instructions.  Will watch your condition.  Will get help if you are not doing well or get worse. Document  Released: 09/11/2004 Document Revised: 04/15/2012 Document Reviewed: 02/04/2012 [employer] Patient Information 2013 [employer].  

## 2012-12-04 NOTE — Progress Notes (Signed)
Here for follow up from last office visit.  Also reports this week has had pain in RLQ, swelling

## 2012-12-05 NOTE — Progress Notes (Signed)
History:  47 y.o. [name] here today for report of right sided pelvic pain for a few months especially during her period. No pain currenty. Pain is 5/10 scale and constant,sometimes alleviated by NSAIDs.  Sometimes associated with nausea and bloating, no other GYN symptoms.  The following portions of the patient's history were reviewed and updated as appropriate: allergies, current medications, past family history, past medical history, past social history, past surgical history and problem list. Normal pap in 08/2012, normal mammogram in 11/2012.  Review of Systems:  Pertinent items are noted in HPI.  Objective:  Physical Exam BP 135/86  Pulse 74  Temp 97.6 F (36.4 C)  Ht 5\' 1"  (1.549 m)  Wt 160 lb 14.4 oz (72.984 kg)  BMI 30.40 kg/m2  LMP 12/01/2012 Gen: NAD Abd: Soft, nontender and nondistended Pelvic: Normal appearing external genitalia; normal appearing vaginal mucosa and cervix.  Normal discharge.  Small uterus, no other palpable masses, no uterine or adnexal tenderness  Assessment & Plan:  Pelvic ultrasound ordered, will follow up results and manage accordingly. Refilled Tramadol as per her request and also gave Rx for Diclofenac Pain precautions reviewed

## 2012-12-08 ENCOUNTER — Ambulatory Visit (HOSPITAL_COMMUNITY): Admission: RE | Admit: 2012-12-08 | Payer: 59 | Source: Ambulatory Visit

## 2012-12-19 ENCOUNTER — Ambulatory Visit (HOSPITAL_COMMUNITY)
Admission: RE | Admit: 2012-12-19 | Discharge: 2012-12-19 | Disposition: A | Discharge status: Home / Self Care | Payer: 59 | Source: Ambulatory Visit | Attending: Obstetrics & Gynecology | Admitting: Obstetrics & Gynecology

## 2012-12-19 DIAGNOSIS — R102 Pelvic and perineal pain: Secondary | ICD-10-CM

## 2012-12-19 DIAGNOSIS — N949 Unspecified condition associated with female genital organs and menstrual cycle: Secondary | ICD-10-CM | POA: Insufficient documentation

## 2012-12-19 DIAGNOSIS — N859 Noninflammatory disorder of uterus, unspecified: Secondary | ICD-10-CM | POA: Insufficient documentation

## 2012-12-22 ENCOUNTER — Telehealth: Payer: Self-pay | Admitting: General Practice

## 2012-12-22 NOTE — Telephone Encounter (Signed)
Message copied by Kathee Delton on Mon Dec 22, 2012  2:39 PM ------      Message from: Jaynie Collins A      Created: Fri Dec 19, 2012 11:36 AM       Normal post-ablation pelvic ultrasound, normal ovaries.  Please call to inform patient of results.       ------

## 2012-12-22 NOTE — Telephone Encounter (Signed)
Called patient, no answer- left message to call us back at the clinics 

## 2012-12-23 NOTE — Telephone Encounter (Signed)
Called pt and informed her of normal pelvic US results as stated by Dr. Macon Large.  Pt voiced understanding and had no questions.

## 2013-02-05 ENCOUNTER — Other Ambulatory Visit: Payer: Self-pay | Admitting: Occupational Medicine

## 2013-02-05 ENCOUNTER — Ambulatory Visit: Payer: Self-pay

## 2013-02-05 DIAGNOSIS — R7612 Nonspecific reaction to cell mediated immunity measurement of gamma interferon antigen response without active tuberculosis: Secondary | ICD-10-CM

## 2013-03-10 ENCOUNTER — Ambulatory Visit: Payer: Self-pay | Admitting: Family Medicine

## 2013-03-10 ENCOUNTER — Ambulatory Visit (INDEPENDENT_AMBULATORY_CARE_PROVIDER_SITE_OTHER): Payer: 59 | Admitting: Family Medicine

## 2013-03-10 ENCOUNTER — Encounter: Payer: Self-pay | Admitting: Family Medicine

## 2013-03-10 VITALS — BP 112/76 | HR 67 | Temp 98.0°F | Ht 61.0 in | Wt 163.4 lb

## 2013-03-10 DIAGNOSIS — F329 Major depressive disorder, single episode, unspecified: Secondary | ICD-10-CM

## 2013-03-10 DIAGNOSIS — N3946 Mixed incontinence: Secondary | ICD-10-CM

## 2013-03-10 DIAGNOSIS — R300 Dysuria: Secondary | ICD-10-CM

## 2013-03-10 LAB — POCT URINALYSIS DIPSTICK
Blood, UA: NEGATIVE
Ketones, UA: NEGATIVE
Leukocytes, UA: NEGATIVE
Protein, UA: NEGATIVE
Spec Grav, UA: 1.025
pH, UA: 6.5

## 2013-03-10 MED ORDER — PANTOPRAZOLE SODIUM 20 MG PO TBEC: 20.0000 mg | DELAYED_RELEASE_TABLET | Freq: Every day | ORAL | Status: DC

## 2013-03-10 MED ORDER — BUSPIRONE HCL 15 MG PO TABS: 15.0000 mg | ORAL_TABLET | Freq: Two times a day (BID) | ORAL | Status: DC

## 2013-03-10 MED ORDER — DULOXETINE HCL 20 MG PO CPEP: 20.0000 mg | ORAL_CAPSULE | Freq: Every day | ORAL | Status: DC

## 2013-03-10 MED ORDER — SOLIFENACIN SUCCINATE 10 MG PO TABS: 10.0000 mg | ORAL_TABLET | Freq: Every day | ORAL | Status: DC

## 2013-03-10 NOTE — Progress Notes (Signed)
Patient ID: Jillyn Hidden, female   DOB: [birth year], 47 y.o.   MRN: [id number]  [facility] Tanush Drees M. Keylon Labelle, MD Phone: [phone]   Subjective: HPI: Patient is a 47 y.o. female presenting to clinic today for same day appointment. Concerns today include urinary frequency/urgency.   Also requesting refills on her medications. I have discussed with Ms. Khouri that I cannot refill all of her medications on a same day visit, and she will need appointment with Dr. Tye Savoy to discuss this. Since she is out of all of her anti-depressants, I will refill these for one month only until she can get a follow up appointment.  URINARY FREQUENCY: Onset: Last week    Worsening: Yes, almost wet her pants yesterday   Normally associated with menstrual cycle but had her normal cycle (3 days) 2 weeks ago and urgency continues. Was on Vesicare in the past (Dr. Julien Girt put her on it a year or so ago, but no refills in over one month.)  Symptoms Urgency: yes  Frequency: yes  Hesitancy: yes, but not always  Hematuria: no  Flank Pain: no  Fever: no    Nausea/Vomiting: no  Pregnant: no STD exposure/history: no Discharge: no Irritants: no Rash: no  Red Flags  : (Risk Factors for Complicated UTI) Recent Antibiotic Usage (last 30 days): no. Last UTI was over one year ago  Symptoms lasting more than seven (7) days: yes  More than 3 UTI's last 12 months: no  PMH of  1. DM: no 2. Renal Disease/Calculi: no 3. Urinary Tract Abnormality: no  4. Instrumentation/Trauma: no 5. Immunosuppression: no  History Reviewed: Former smoker.  ROS: Please see HPI above.  Objective: Office vital signs reviewed. BP 112/76  Pulse 67  Temp(Src) 98 F (36.7 C) (Oral)  Ht 5\' 1"  (1.549 m)  Wt 163 lb 6.4 oz (74.118 kg)  BMI 30.89 kg/m2  LMP 02/25/2013  Physical Examination:  General: Awake, alert. NAD HEENT: Atraumatic, normocephalic. MMM. Pulm: CTAB, no wheezes Cardio: RRR, no murmurs  appreciated Abdomen:+BS, soft, nontender, nondistended. No TTP of bladder Extremities: No edema Neuro: Grossly intact  Assessment: 47 y.o. female with urinary frequency  Plan: See Problem List and After Visit Summary

## 2013-03-10 NOTE — Patient Instructions (Addendum)
It was good to see you today.  Results for orders placed in visit on 03/10/13 (from the past 24 hour(s))  POCT URINALYSIS DIPSTICK     Status: None   Collection Time    03/10/13 10:14 AM      Result Value Range   Color, UA YELLOW     Clarity, UA CLEAR     Glucose, UA NEG     Bilirubin, UA NEG     Ketones, UA NEG     Spec Grav, UA 1.025     Blood, UA NEG     pH, UA 6.5     Protein, UA NEG     Urobilinogen, UA 0.2     Nitrite, UA NEG     Leukocytes, UA Negative     Narrative:    Biochemical negative, microscopic not indicated   Lets start with the Vesicare. If that does not work, let me know and we will try a short term antibiotic. I refilled the Protonix and your Buspar and Cymbalta.  Make an appointment to see Dr. Tye Savoy within the next month.  Liboria Putnam M. Maksymilian Mabey, M.D.

## 2013-03-11 NOTE — Assessment & Plan Note (Signed)
Pt reports more frequency and urgency than dysuria today. UA does not look like a UTI today. Will restart Vesicare. Pt states she will not go back to Dr. Geraldine Contras clinic based on past experiences. Hopefully restarting Vesicare will help with her problem like it has in the past. I gave her the option to try a short term antibiotic since she has multiple UTI, and this feels like it normally does, although I do not think this will be helpful. She states she will wait to see how she does. Can discuss plan further with her PCP at next appt.

## 2013-03-11 NOTE — Assessment & Plan Note (Signed)
Medication refilled x 1 month. Will f/u with PCP for further discussion and longer refills. Pt agrees.

## 2013-03-30 ENCOUNTER — Ambulatory Visit (INDEPENDENT_AMBULATORY_CARE_PROVIDER_SITE_OTHER): Payer: 59 | Admitting: Family Medicine

## 2013-03-30 ENCOUNTER — Encounter: Payer: Self-pay | Admitting: Family Medicine

## 2013-03-30 VITALS — Ht 61.0 in | Wt 162.7 lb

## 2013-03-30 DIAGNOSIS — M25512 Pain in left shoulder: Secondary | ICD-10-CM

## 2013-03-30 DIAGNOSIS — M25519 Pain in unspecified shoulder: Secondary | ICD-10-CM

## 2013-03-30 MED ORDER — METHYLPREDNISOLONE ACETATE 40 MG/ML IJ SUSP
40.0000 mg | Freq: Once | INTRAMUSCULAR | Status: AC
  Administered 2013-03-30: 40 mg via INTRA_ARTICULAR

## 2013-03-30 NOTE — Patient Instructions (Addendum)

## 2013-03-31 DIAGNOSIS — M25512 Pain in left shoulder: Secondary | ICD-10-CM | POA: Insufficient documentation

## 2013-03-31 NOTE — Assessment & Plan Note (Signed)
Acute shoulder pain with history and exam consistent with subacromial bursitis.  Shoulder injected today, tolerated well.  Post procedure instructions given, follow up if not improving.

## 2013-03-31 NOTE — Progress Notes (Signed)
  Subjective:    Patient ID: [name], female    DOB: [birth year], 47 y.o.   MRN: [id number]  HPI  1. Shoulder pain:  Here with c/o shoulder pain x2-3 days.  Pain is a a throbbing pain that radiates into the outer shoulder.  Pain is worsened by flexing arm or overhead movements. Shoulder is very painful to sleep on.  She has tried NSAID's which have been mildly helpful. She denies any trauma, overuse or repetitive movements.  Review of Systems Denies fever, chills, neck pain    Objective:   Physical Exam  Constitutional: She appears well-nourished. No distress.  Musculoskeletal:  Shoulder: Inspection reveals no abnormalities, atrophy or asymmetry. Palpation is normal with no tenderness over AC joint or bicipital groove. ROM is limited with decreased flexion and pain with trying to bring arm overhead. Difficult to test rotator cuff strength due to pain + Neer and Hawkin's tests, Speeds and Yergason's tests normal. no drop arm sign.    INJECTION: Patient was given informed consent, signed copy in the chart. Appropriate time out was taken. Area prepped and draped in usual sterile fashion. 1 cc of methylprednisolone 40 mg/ml plus  4 cc of 1% lidocaine without epinephrine was injected into the subacromial space using a(n) posterior approach. The patient tolerated the procedure well. There were no complications. Post procedure instructions were given.         Assessment & Plan:

## 2013-04-09 ENCOUNTER — Telehealth: Payer: Self-pay | Admitting: Family Medicine

## 2013-04-09 NOTE — Telephone Encounter (Signed)
Patient dropped off FMLA papers to be filled out.   Please call her when completed.

## 2013-04-10 ENCOUNTER — Ambulatory Visit (INDEPENDENT_AMBULATORY_CARE_PROVIDER_SITE_OTHER): Payer: 59 | Admitting: Family Medicine

## 2013-04-10 ENCOUNTER — Encounter: Payer: Self-pay | Admitting: Family Medicine

## 2013-04-10 VITALS — BP 124/80 | HR 79 | Temp 98.4°F | Ht 61.0 in | Wt 163.4 lb

## 2013-04-10 DIAGNOSIS — M25519 Pain in unspecified shoulder: Secondary | ICD-10-CM

## 2013-04-10 DIAGNOSIS — M25512 Pain in left shoulder: Secondary | ICD-10-CM

## 2013-04-10 MED ORDER — PREDNISONE (PAK) 10 MG PO TABS: ORAL_TABLET | ORAL | Status: DC

## 2013-04-10 NOTE — Assessment & Plan Note (Signed)
Likely subacromial bursitis.  Will try Prednisone taper, see AVS for details.  If no improvement in 2 weeks, patient to return to clinic for possible referral to PT and/or Sports Med clinic.  Handout given with ROM exercises.

## 2013-04-10 NOTE — Patient Instructions (Addendum)
For shoulder pain, take Prednisone taper as directed. Take 40 mg (4 tablets) daily x 3 days, then 20 mg (2 tablets) x 3 days, then 10 mg ( one tablet) x 3 days for a total of 9 days. Practice range of motion exercises three times per day to help improve pain. If pain does not improve in 2 weeks, please return to clinic.  Shoulder Range of Motion Exercises The shoulder is the most flexible joint in the human body. Because of this it is also the most unstable joint in the body. All ages can develop shoulder problems. Early treatment of problems is necessary for a good outcome. People react to shoulder pain by decreasing the movement of the joint. After a brief period of time, the shoulder can become "frozen". This is an almost complete loss of the ability to move the damaged shoulder. Following injuries your caregivers can give you instructions on exercises to keep your range of motion (ability to move your shoulder freely), or regain it if it has been lost.  EXERCISES EXERCISES TO MAINTAIN THE MOBILITY OF YOUR SHOULDER: Codman's Exercise or Pendulum Exercise  This exercise may be performed in a prone (face-down) lying position or standing while leaning on a chair with the opposite arm. Its purpose is to relax the muscles in your shoulder and slowly but surely increase the range of motion and to relieve pain.  Lie on your stomach close to the side edge of the bed. Let your weak arm hang over the edge of the bed. Relax your shoulder, arm and hand. Let your shoulder blade relax and drop down.  Slowly and gently swing your arm forward and back. Do not use your neck muscles; relax them. It might be easier to have someone else gently start swinging your arm.  As pain decreases, increase your swing. To start, arm swing should begin at 15 degree angles. In time and as pain lessens, move to 30-45 degree angles. Start with swinging for about 15 seconds, and work towards swinging for 3 to 5 minutes.  This  exercise may also be performed in a standing/bent over position.  Stand and hold onto a sturdy chair with your good arm. Bend forward at the waist and bend your knees slightly to help protect your back. Relax your weak arm, let it hang limp. Relax your shoulder blade and let it drop.  Keep your shoulder relaxed and use body motion to swing your arm in small circles.  Stand up tall and relax.  Repeat motion and change direction of circles.  Start with swinging for about 30 seconds, and work towards swinging for 3 to 5 minutes. STRETCHING EXERCISES:  Lift your arm out in front of you with the elbow bent at 90 degrees. Using your other arm gently pull the elbow forward and across your body.  Bend one arm behind you with the palm facing outward. Using the other arm, hold a towel or rope and reach this arm up above your head, then bend it at the elbow to move your wrist to behind your neck. Grab the free end of the towel with the hand behind your back. Gently pull the towel up with the hand behind your neck, gradually increasing the pull on the hand behind the small of your back. Then, gradually pull down with the hand behind the small of your back. This will pull the hand and arm behind your neck further. Both shoulders will have an increased range of motion with repetition  of this exercise. STRENGTHENING EXERCISES:  Standing with your arm at your side and straight out from your shoulder with the elbow bent at 90 degrees, hold onto a small weight and slowly raise your hand so it points straight up in the air. Repeat this five times to begin with, and gradually increase to ten times. Do this four times per day. As you grow stronger you can gradually increase the weight.  Repeat the above exercise, only this time using an elastic band. Start with your hand up in the air and pull down until your hand is by your side. As you grow stronger, gradually increase the amount you pull by increasing the number  or size of the elastic bands. Use the same amount of repetitions.  Standing with your hand at your side and holding onto a weight, gradually lift the hand in front of you until it is over your head. Do the same also with the hand remaining at your side and lift the hand away from your body until it is again over your head. Repeat this five times to begin with, and gradually increase to ten times. Do this four times per day. As you grow stronger you can gradually increase the weight. Document Released: 07/14/2003 Document Revised: 01/07/2012 Document Reviewed: 10/15/2005 [facility] Patient Information 2014 [place], [place].

## 2013-04-10 NOTE — Progress Notes (Signed)
  Subjective:    Patient ID: [name], female    DOB: 07/09/1966, 47 y.o.   MRN: [id number]  HPI  Follow up shoulder pain: patient says shoulder pain left is hurting again.  Steroid Injection given on 03/30/13 which relieved pain for about one week.  Pain scale 5/10 today.  She has been taking Tramadol and Aleve which helps relieve pain, but it has not completely resolved.  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  She denies any associated neck pain or numbness/tingling left arm.  FMLA: She has a hx of migraine HA and asthma, and she found out I was graduating soon, so brought FMLA form to be filled out.   Review of Systems Per HPI    Objective:   Physical Exam General: pleasant, in NAD Shoulder, LT: Inspection reveals no abnormalities, atrophy or asymmetry. Tenderness over subacromial bursa. ROM is limited with supination due to pain. Rotator cuff strength normal throughout. Normal scapular function observed. 5/5 strength in both UE. Normal neck ROM.      Assessment & Plan:

## 2013-06-07 ENCOUNTER — Encounter (HOSPITAL_COMMUNITY): Payer: Self-pay

## 2013-06-07 ENCOUNTER — Emergency Department (HOSPITAL_COMMUNITY)
Admission: EM | Admit: 2013-06-07 | Discharge: 2013-06-07 | Disposition: A | Discharge status: Home / Self Care | Payer: 59 | Attending: Emergency Medicine | Admitting: Emergency Medicine

## 2013-06-07 DIAGNOSIS — Z862 Personal history of diseases of the blood and blood-forming organs and certain disorders involving the immune mechanism: Secondary | ICD-10-CM | POA: Insufficient documentation

## 2013-06-07 DIAGNOSIS — R112 Nausea with vomiting, unspecified: Secondary | ICD-10-CM | POA: Insufficient documentation

## 2013-06-07 DIAGNOSIS — H53149 Visual discomfort, unspecified: Secondary | ICD-10-CM | POA: Insufficient documentation

## 2013-06-07 DIAGNOSIS — R209 Unspecified disturbances of skin sensation: Secondary | ICD-10-CM | POA: Insufficient documentation

## 2013-06-07 DIAGNOSIS — G43909 Migraine, unspecified, not intractable, without status migrainosus: Secondary | ICD-10-CM | POA: Diagnosis present

## 2013-06-07 DIAGNOSIS — F411 Generalized anxiety disorder: Secondary | ICD-10-CM | POA: Insufficient documentation

## 2013-06-07 DIAGNOSIS — K219 Gastro-esophageal reflux disease without esophagitis: Secondary | ICD-10-CM | POA: Insufficient documentation

## 2013-06-07 DIAGNOSIS — Z79899 Other long term (current) drug therapy: Secondary | ICD-10-CM | POA: Insufficient documentation

## 2013-06-07 DIAGNOSIS — Z87891 Personal history of nicotine dependence: Secondary | ICD-10-CM | POA: Insufficient documentation

## 2013-06-07 DIAGNOSIS — J45909 Unspecified asthma, uncomplicated: Secondary | ICD-10-CM | POA: Insufficient documentation

## 2013-06-07 DIAGNOSIS — N189 Chronic kidney disease, unspecified: Secondary | ICD-10-CM | POA: Insufficient documentation

## 2013-06-07 DIAGNOSIS — F329 Major depressive disorder, single episode, unspecified: Secondary | ICD-10-CM | POA: Insufficient documentation

## 2013-06-07 DIAGNOSIS — F3289 Other specified depressive episodes: Secondary | ICD-10-CM | POA: Insufficient documentation

## 2013-06-07 MED ORDER — DIPHENHYDRAMINE HCL 50 MG/ML IJ SOLN
25.0000 mg | Freq: Once | INTRAMUSCULAR | Status: AC
  Administered 2013-06-07: 25 mg via INTRAVENOUS
  Filled 2013-06-07: qty 1

## 2013-06-07 MED ORDER — SODIUM CHLORIDE 0.9 % IV BOLUS (SEPSIS)
1000.0000 mL | INTRAVENOUS | Status: AC
  Administered 2013-06-07: 1000 mL via INTRAVENOUS

## 2013-06-07 MED ORDER — PROCHLORPERAZINE EDISYLATE 5 MG/ML IJ SOLN
10.0000 mg | INTRAMUSCULAR | Status: AC
  Administered 2013-06-07: 10 mg via INTRAVENOUS
  Filled 2013-06-07: qty 2

## 2013-06-07 MED ORDER — DEXAMETHASONE SODIUM PHOSPHATE 10 MG/ML IJ SOLN
10.0000 mg | Freq: Once | INTRAMUSCULAR | Status: AC
  Administered 2013-06-07: 10 mg via INTRAVENOUS
  Filled 2013-06-07: qty 1

## 2013-06-07 MED ORDER — ONDANSETRON HCL 4 MG/2ML IJ SOLN
4.0000 mg | Freq: Once | INTRAMUSCULAR | Status: AC
  Administered 2013-06-07: 4 mg via INTRAVENOUS
  Filled 2013-06-07: qty 2

## 2013-06-07 NOTE — ED Notes (Signed)
Pt. Having rt. Side headache.  Began last night.  Had a temporary vision loss in her rt. Eye.  Pt. Very tearful

## 2013-06-07 NOTE — ED Notes (Signed)
Had a headache that started at midnight-- woke up from sleep, sat up, was nauseated- and head was pounding. Mostly on right side-- vision in right eye blurry, had headache like this last July and was seen in the ED

## 2013-06-07 NOTE — ED Provider Notes (Signed)
CSN: [id number]     Arrival date & time 06/07/13  0820 History     First MD Initiated Contact with Patient 06/07/13 [phone]     Chief Complaint  Patient presents with  . Migraine   (Consider location/radiation/quality/duration/timing/severity/associated sxs/prior Treatment) Patient is a 47 y.o. female presenting with migraines. The history is provided by the patient.  Migraine This is a recurrent problem. The current episode started 6 to 12 hours ago. The problem occurs constantly. The problem has not changed since onset.Associated symptoms include headaches. Pertinent negatives include no chest pain, no abdominal pain and no shortness of breath. Nothing aggravates the symptoms. Nothing relieves the symptoms. Treatments tried: Migraine medicine. The treatment provided mild relief.    Past Medical History  Diagnosis Date  . Migraines   . Asthma   . Anemia   . Migraine   . Anxiety   . Depression   . Termination of pregnancy     x 2  . PONV (postoperative nausea and vomiting)   . Dysrhythmia     Irregular heat w/ asthma episodes and anxiety  . Sleep apnea     uses CPAP  . GERD (gastroesophageal reflux disease)   . Chronic kidney disease     frequent urination- tx w/ vesicare   Past Surgical History  Procedure Laterality Date  . Ablation    . Cesarean section      x3  . Mab      x 4  . Dilation and curettage of uterus  05/31/2005    MAB   . Tubal ligation  11/01/2006  . Leep    . Hernia repair  1990's  . Wisdom tooth extraction    . Mouth surgery      teeth extraction  . Hysteroscopy w/ endometrial ablation     Family History  Problem Relation Age of Onset  . Diabetes Maternal Grandmother   . Anesthesia problems Neg Hx    History  Substance Use Topics  . Smoking status: Former Smoker -- 0.25 packs/day for 15 years    Types: Cigarettes    Quit date: 02/16/2003  . Smokeless tobacco: Never Used  . Alcohol Use: Yes     Comment: socially   OB History   Grav Para  Term Preterm Abortions TAB SAB Ect Mult Living   8 4 4  0 4     4     Review of Systems  Constitutional: Negative for fever and fatigue.  HENT: Negative for congestion, drooling and neck pain.   Eyes: Positive for photophobia and visual disturbance (blurry vision in right eye). Negative for pain.  Respiratory: Negative for cough and shortness of breath.   Cardiovascular: Negative for chest pain.  Gastrointestinal: Positive for nausea and vomiting. Negative for abdominal pain and diarrhea.  Genitourinary: Negative for dysuria and hematuria.  Musculoskeletal: Negative for back pain and gait problem.  Skin: Negative for color change.  Neurological: Positive for numbness (right side of face surrounding right eye) and headaches. Negative for dizziness.  Hematological: Negative for adenopathy.  Psychiatric/Behavioral: Negative for behavioral problems.  All other systems reviewed and are negative.    Allergies  Ciprofloxacin; Fluticasone-salmeterol; Macrobid; Nitrofurantoin; and Sulfamethoxazole w-trimethoprim  Home Medications   Current Outpatient Rx  Name  Route  Sig  Dispense  Refill  . acetaminophen (TYLENOL) 500 MG tablet   Oral   Take 1,000 mg by mouth every 6 (six) hours as needed. For pain         .  albuterol (PROVENTIL HFA;VENTOLIN HFA) 108 (90 BASE) MCG/ACT inhaler   Inhalation   Inhale 2 puffs into the lungs every 4 (four) hours as needed for wheezing. For SOB   18 g   3   . albuterol (PROVENTIL) (2.5 MG/3ML) 0.083% nebulizer solution   Nebulization   Take 3 mLs (2.5 mg total) by nebulization every 4 (four) hours as needed.   75 mL   6   . Ascorbic Acid (VITAMIN C PO)   Oral   Take 1 tablet by mouth daily. Unknown OTC strength         . benzonatate (TESSALON) 200 MG capsule   Oral   Take 1 capsule (200 mg total) by mouth 2 (two) times daily as needed for cough.   20 capsule   0   . budesonide-formoterol (SYMBICORT) 160-4.5 MCG/ACT inhaler   Inhalation    Inhale 2 puffs into the lungs 2 (two) times daily. With spacer for asthma control   3 Inhaler   3   . busPIRone (BUSPAR) 15 MG tablet   Oral   Take 1 tablet (15 mg total) by mouth 2 (two) times daily.   60 tablet   0   . chlorproMAZINE (THORAZINE) 25 MG tablet   Oral   Take 1 tablet (25 mg total) by mouth every 6 (six) hours as needed. For nausea/migraine   30 tablet   6   . diclofenac (VOLTAREN) 75 MG EC tablet   Oral   Take 1 tablet (75 mg total) by mouth 2 (two) times daily with a meal.   60 tablet   2   . DULoxetine (CYMBALTA) 20 MG capsule   Oral   Take 1 capsule (20 mg total) by mouth daily.   30 capsule   0   . fexofenadine (ALLEGRA) 180 MG tablet   Oral   Take 1 tablet (180 mg total) by mouth every evening.   90 tablet   3   . fluticasone (FLONASE) 50 MCG/ACT nasal spray   Nasal   Place 2 sprays into the nose daily.   16 g   6   . ibuprofen (ADVIL,MOTRIN) 600 MG tablet   Oral   Take 1 tablet (600 mg total) by mouth every 6 (six) hours as needed for pain. For pain   60 tablet   2   . montelukast (SINGULAIR) 10 MG tablet   Oral   Take 1 tablet (10 mg total) by mouth at bedtime.   90 tablet   3   . ondansetron (ZOFRAN ODT) 4 MG disintegrating tablet   Oral   Take 1 tablet (4 mg total) by mouth every 6 (six) hours as needed. For nausea   20 tablet   0   . pantoprazole (PROTONIX) 20 MG tablet   Oral   Take 1 tablet (20 mg total) by mouth daily.   30 tablet   0   . predniSONE (STERAPRED UNI-PAK) 10 MG tablet      Take 40 mg daily x 3 days, then 20 mg x 3 days, then 10 mg x 3 days.   21 tablet   0   . propranolol ER (INDERAL LA) 80 MG 24 hr capsule   Oral   Take 1 capsule (80 mg total) by mouth daily.   30 capsule   0   . Pyridoxine HCl (VITAMIN B-6 PO)   Oral   Take 1 tablet by mouth daily. Unknown OTC strength         .  solifenacin (VESICARE) 10 MG tablet   Oral   Take 1 tablet (10 mg total) by mouth daily. Unknown dose for  bladder   90 tablet   3   . Spacer/Aero-Holding Chambers (AEROCHAMBER PLUS) inhaler   Other   1 each by Other route once. Please provide patient with 1 spacer for use with her inhalers Use as instructed          . traMADol (ULTRAM) 50 MG tablet   Oral   Take 1 tablet (50 mg total) by mouth every 8 (eight) hours as needed for pain.   30 tablet   0   . EXPIRED: traMADol (ULTRAM) 50 MG tablet   Oral   Take 1 tablet (50 mg total) by mouth every 8 (eight) hours as needed for pain.   30 tablet   0   . traZODone (DESYREL) 50 MG tablet   Oral   Take 1 tablet (50 mg total) by mouth at bedtime.   30 tablet   0   . verapamil (CALAN) 40 MG tablet   Oral   Take 40 mg by mouth as needed. When gets palpitations         . zolmitriptan (ZOMIG) 5 MG tablet   Oral   Take 1 tablet (5 mg total) by mouth as needed. For migraine   10 tablet   6    BP 131/72  Pulse 63  Temp(Src) 97.8 F (36.6 C) (Oral)  Resp 16  SpO2 99% Physical Exam  Nursing note and vitals reviewed. Constitutional: She is oriented to person, place, and time. She appears well-developed and well-nourished.  HENT:  Head: Normocephalic.  Mouth/Throat: No oropharyngeal exudate.  Eyes: Conjunctivae and EOM are normal. Pupils are equal, round, and reactive to light.  Neck: Normal range of motion. Neck supple.  Cardiovascular: Normal rate, regular rhythm, normal heart sounds and intact distal pulses.  Exam reveals no gallop and no friction rub.   No murmur heard. Pulmonary/Chest: Effort normal and breath sounds normal. No respiratory distress. She has no wheezes.  Abdominal: Soft. Bowel sounds are normal. There is no tenderness. There is no rebound and no guarding.  Musculoskeletal: Normal range of motion. She exhibits no edema and no tenderness.  Neurological: She is alert and oriented to person, place, and time. She has normal strength. No sensory deficit.  Mild altered sensation around right periorbital area.  Peripheral fields intact.   Skin: Skin is warm and dry.  Psychiatric: She has a normal mood and affect. Her behavior is normal.    ED Course   Procedures (including critical care time)  Labs Reviewed - No data to display No results found. 1. Migraine     MDM  8:54 AM 47 y.o. female here w/ migraine that began at midnight last night. Cw previous migraines, including assoc sx of facial numbness/blurry vision/vomiting. Will give migraine cocktail.   11:22 AM: Pt feeling better.  I have discussed the diagnosis/risks/treatment options with the patient and believe the pt to be eligible for discharge home to follow-up with pcp as needed. We also discussed returning to the ED immediately if new or worsening sx occur. We discussed the sx which are most concerning (e.g., worsening HA, fever, worsening vision) that necessitate immediate return. Any new prescriptions provided to the patient are listed below.  New Prescriptions   No medications on file     Junius Argyle, MD 06/07/13 2002

## 2013-06-07 NOTE — ED Notes (Signed)
Report to Magda Paganini, Charity fundraiser. IV saline locked in left hand.

## 2013-06-30 ENCOUNTER — Ambulatory Visit (INDEPENDENT_AMBULATORY_CARE_PROVIDER_SITE_OTHER): Payer: 59 | Admitting: Family Medicine

## 2013-06-30 ENCOUNTER — Encounter: Payer: Self-pay | Admitting: Family Medicine

## 2013-06-30 VITALS — BP 119/87 | HR 69 | Temp 98.1°F | Wt 170.0 lb

## 2013-06-30 DIAGNOSIS — M549 Dorsalgia, unspecified: Secondary | ICD-10-CM | POA: Insufficient documentation

## 2013-06-30 DIAGNOSIS — N3946 Mixed incontinence: Secondary | ICD-10-CM

## 2013-06-30 DIAGNOSIS — R399 Unspecified symptoms and signs involving the genitourinary system: Secondary | ICD-10-CM

## 2013-06-30 DIAGNOSIS — R3989 Other symptoms and signs involving the genitourinary system: Secondary | ICD-10-CM

## 2013-06-30 LAB — POCT URINALYSIS DIPSTICK
Glucose, UA: NEGATIVE
Leukocytes, UA: NEGATIVE
Nitrite, UA: NEGATIVE
Protein, UA: NEGATIVE
Urobilinogen, UA: 0.2

## 2013-06-30 MED ORDER — OXYBUTYNIN CHLORIDE ER 10 MG PO TB24: 10.0000 mg | ORAL_TABLET | Freq: Every day | ORAL | Status: DC

## 2013-06-30 NOTE — Assessment & Plan Note (Addendum)
Patient with worsening urgency. UA unremarkable. Vesicare of less help now. Will give trial of oxybutinin. F/u in one month.   Addendum: UCx sent revealing >100,000 colonies of GNR. Will treat with keflex and await sensitivities.

## 2013-06-30 NOTE — Patient Instructions (Addendum)
Nice to meet you. Your urine did not reveal an infection. I will send it for a culture to make sure there is no infection. I believe your symptoms may be due to your bladder being over active and the vesicare not working as it used to. We will try oxybutinin to treat this. If your symptoms do not improve or get worse let us know. If you develop worsening nausea and vomiting, fever, worsening pain please let us know. I believe your back pain may be related to some muscle spasm. Please continue to use tylenol for this.

## 2013-06-30 NOTE — Assessment & Plan Note (Signed)
Likely MSK in origin given tenderness and spasm of paraspinous muscles. No CVA tenderness and negative UA makes UTI or pyelo unlikely. Discussed ibuprofen and flexeril with patient and she denied these treatments due to upset stomach and drowsiness respectively. Advised to continue tylenol for pain.

## 2013-06-30 NOTE — Progress Notes (Signed)
  Subjective:    Patient ID: [name], female    DOB: 06/19/1966, 47 y.o.   MRN: [id number]  HPI  Urgency  Onset: 2 weeks ago   Worsening: yes, feels like spasms in bladder, if doesn't make it to the bathroom quickly she will be incontinent.    Symptoms Dysuria: no  Frequency: no  Hesitancy: yes  Hematuria: no  Flank Pain: no  Fever: no, notes some chills Nausea/Vomiting: yes, one episode last night  STD exposure/history: no, states has been with same partner for 5 years and talked to him about it last night Discharge: no  Red Flags  : (Risk Factors for Complicated UTI) Recent Antibiotic Usage (last 30 days): no  Symptoms lasting more than seven (7) days: yes  More than 3 UTI's last 12 months: no  PMH of  1. DM: no 2. Renal Disease/Calculi: no  3. Instrumentation/Trauma: yes, in the distant past around the time of the birth of her 40 yo son  Notes has previously been diagnosed with urge incontinence. Is on vesicare for this and feels this is not helping that much.   Also notes low back pain centrally located and moving laterally. Keeps stating "it is in the area of my kidneys." Does not remember injuring her back. Notes slight tightness and uncomfortableness when sitting upright with back.  PMH: currently sexually active with one partner  Review of Systems see HPI     Objective:   Physical Exam  Constitutional: She appears well-developed and well-nourished.  HENT:  Head: Normocephalic and atraumatic.  Abdominal: Soft. Bowel sounds are normal. She exhibits no distension and no mass. There is tenderness (suprapubic). There is no rebound and no guarding.  Musculoskeletal:  Low back with paraspinous muscle tenderness to palpation and spasm noted, no spinous process TTP  Neurological: She is alert.  Sensation to light touch intact in bilateral LE, 5/5 strength in LE, 2+ patellar reflex  Skin: Skin is warm and dry.  BP 119/87  Pulse 69  Temp(Src) 98.1 F (36.7 C)  (Oral)  Wt 170 lb (77.111 kg)  BMI 32.14 kg/m2  LMP 06/12/2013  Urine dipstick shows urobilinogen 0.2, otherwise negative.  Micro exam: not done.      Assessment & Plan:

## 2013-07-02 ENCOUNTER — Telehealth: Payer: Self-pay | Admitting: Family Medicine

## 2013-07-02 LAB — URINE CULTURE: Colony Count: >=100000

## 2013-07-02 MED ORDER — CEPHALEXIN 500 MG PO CAPS: 500.0000 mg | ORAL_CAPSULE | Freq: Two times a day (BID) | ORAL | Status: DC

## 2013-07-02 NOTE — Telephone Encounter (Signed)
Pt is retuning a call from [place]. I didn't see who or when we called but she said it was today. JW

## 2013-07-02 NOTE — Telephone Encounter (Signed)
Pt called because she is in pain and would like some type of pain medication. She is in pain and has urination issues and is unsure what to do. She is working at [employer] today and to call her at this number [phone]. JW

## 2013-07-02 NOTE — Addendum Note (Signed)
Addended by: Glori Luis on: 07/02/2013 09:31 AM   Modules accepted: Orders

## 2013-07-03 NOTE — Telephone Encounter (Signed)
Dr. De Nurse took care of this patient and this should be directed to him. These are the prior notes I was able to find in the system concerning this: Notes Recorded by Feliz Beam, CMA on 07/03/2013 at 12:04 PM Pt is aware and states that she is starting to feel a little bit better. Informed her to call back next week if not feeling any better. Jazmin Hartsell,CMA  ------  Notes Recorded by Glori Luis, MD on 07/03/2013 at 8:41 AM Grew E coli in culture. This pan sensitive so should be covered by keflex. ------  Notes Recorded by Feliz Beam, CMA on 07/02/2013 at 11:57 AM Tried to call pt again and was unable to get to her at work. Will try again later. Jazmin Hartsell,CMA  ------  Notes Recorded by Glori Luis, MD on 07/02/2013 at 9:33 AM Patient with positive UCx. Will treat with keflex 500 mg BID for 7 days. I have called the patient and left a message for her regarding this information. Please check back with the patient later this afternoon to make sure she got the message. Thanks.

## 2013-07-07 NOTE — Telephone Encounter (Signed)
Dr Claiborne Billings was this addressed.please advise. [name], [name]

## 2013-07-17 ENCOUNTER — Emergency Department (HOSPITAL_COMMUNITY): Payer: 59

## 2013-07-17 ENCOUNTER — Encounter (HOSPITAL_COMMUNITY): Payer: Self-pay | Admitting: Neurology

## 2013-07-17 ENCOUNTER — Emergency Department (HOSPITAL_COMMUNITY)
Admission: EM | Admit: 2013-07-17 | Discharge: 2013-07-17 | Disposition: A | Discharge status: Home / Self Care | Payer: 59 | Attending: Emergency Medicine | Admitting: Emergency Medicine

## 2013-07-17 DIAGNOSIS — Z87891 Personal history of nicotine dependence: Secondary | ICD-10-CM | POA: Insufficient documentation

## 2013-07-17 DIAGNOSIS — Z862 Personal history of diseases of the blood and blood-forming organs and certain disorders involving the immune mechanism: Secondary | ICD-10-CM | POA: Insufficient documentation

## 2013-07-17 DIAGNOSIS — Z79899 Other long term (current) drug therapy: Secondary | ICD-10-CM | POA: Insufficient documentation

## 2013-07-17 DIAGNOSIS — M79609 Pain in unspecified limb: Secondary | ICD-10-CM

## 2013-07-17 DIAGNOSIS — Z9981 Dependence on supplemental oxygen: Secondary | ICD-10-CM | POA: Insufficient documentation

## 2013-07-17 DIAGNOSIS — F329 Major depressive disorder, single episode, unspecified: Secondary | ICD-10-CM | POA: Insufficient documentation

## 2013-07-17 DIAGNOSIS — G4730 Sleep apnea, unspecified: Secondary | ICD-10-CM | POA: Insufficient documentation

## 2013-07-17 DIAGNOSIS — N189 Chronic kidney disease, unspecified: Secondary | ICD-10-CM | POA: Insufficient documentation

## 2013-07-17 DIAGNOSIS — R609 Edema, unspecified: Secondary | ICD-10-CM | POA: Insufficient documentation

## 2013-07-17 DIAGNOSIS — F411 Generalized anxiety disorder: Secondary | ICD-10-CM | POA: Insufficient documentation

## 2013-07-17 DIAGNOSIS — F3289 Other specified depressive episodes: Secondary | ICD-10-CM | POA: Insufficient documentation

## 2013-07-17 DIAGNOSIS — G43909 Migraine, unspecified, not intractable, without status migrainosus: Secondary | ICD-10-CM | POA: Insufficient documentation

## 2013-07-17 DIAGNOSIS — Z880 Allergy status to penicillin: Secondary | ICD-10-CM | POA: Insufficient documentation

## 2013-07-17 DIAGNOSIS — Z8679 Personal history of other diseases of the circulatory system: Secondary | ICD-10-CM | POA: Insufficient documentation

## 2013-07-17 DIAGNOSIS — M7989 Other specified soft tissue disorders: Secondary | ICD-10-CM

## 2013-07-17 DIAGNOSIS — J45909 Unspecified asthma, uncomplicated: Secondary | ICD-10-CM | POA: Insufficient documentation

## 2013-07-17 DIAGNOSIS — IMO0002 Reserved for concepts with insufficient information to code with codable children: Secondary | ICD-10-CM | POA: Insufficient documentation

## 2013-07-17 DIAGNOSIS — K219 Gastro-esophageal reflux disease without esophagitis: Secondary | ICD-10-CM | POA: Insufficient documentation

## 2013-07-17 NOTE — ED Notes (Signed)
Went to check on pt and draw some blood; pt was resting and speaking with the nurse

## 2013-07-17 NOTE — ED Provider Notes (Signed)
CSN: [id number]     Arrival date & time 07/17/13  0719 History   First MD Initiated Contact with Patient 07/17/13 [phone]     Chief Complaint  Patient presents with  . 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 Patient reports that she had onset of bilateral knee pain yesterday while she was working.  She states that when she got up this morning she had increased pain in her left knee that she did down her left calf. She also noted swelling of the left calf. She denies any history of clot, recent estrogen use, recent hospitalization.  Patient denies any fevers. Patient has been ambulatory but with pain. She took a Toradol this morning that has helped minimally. Past Medical History  Diagnosis Date  . Migraines   . Asthma   . Anemia   . Migraine   . Anxiety   . Depression   . Termination of pregnancy     x 2  . PONV (postoperative nausea and vomiting)   . Dysrhythmia     Irregular heat w/ asthma episodes and anxiety  . Sleep apnea     uses CPAP  . GERD (gastroesophageal reflux disease)   . Chronic kidney disease     frequent urination- tx w/ vesicare   Past Surgical History  Procedure Laterality Date  . Ablation    . Cesarean section      x3  . Mab      x 4  . Dilation and curettage of uterus  05/31/2005    MAB   . Tubal ligation  11/01/2006  . Leep    . Hernia repair  1990's  . Wisdom tooth extraction    . Mouth surgery      teeth extraction  . Hysteroscopy w/ endometrial ablation     Family History  Problem Relation Age of Onset  . Diabetes Maternal Grandmother   . Anesthesia problems Neg Hx    History  Substance Use Topics  . Smoking status: Former Smoker -- 0.25 packs/day for 15 years    Types: Cigarettes    Quit date: 02/16/2003  . Smokeless tobacco: Never Used  . Alcohol Use: Yes      Comment: socially   OB History   Grav Para Term Preterm Abortions TAB SAB Ect Mult Living   8 4 4  0 4     4     Review of Systems  Constitutional: Negative for fever.  Respiratory: Positive for shortness of breath. Negative for cough and chest tightness.   Cardiovascular: Negative for chest pain.  Gastrointestinal: Negative for nausea, vomiting and abdominal pain.  Musculoskeletal: Positive for joint swelling. Negative for back pain.  Skin: Negative for wound.  All other systems reviewed and are negative.    Allergies  Amitriptyline; Fluticasone-salmeterol; Penicillins; Ciprofloxacin; Macrobid; Nitrofurantoin; and Sulfamethoxazole w-trimethoprim  Home Medications   Current Outpatient Rx  Name  Route  Sig  Dispense  Refill  . acetaminophen (TYLENOL) 500 MG tablet   Oral   Take 1,000 mg by mouth every 6 (six) hours as needed. For pain         . albuterol (PROVENTIL HFA;VENTOLIN HFA) 108 (90 BASE) MCG/ACT inhaler   Inhalation   Inhale 2 puffs into the lungs every 4 (four) hours as needed for wheezing. For SOB   18 g  3   . albuterol (PROVENTIL) (2.5 MG/3ML) 0.083% nebulizer solution   Nebulization   Take 3 mLs (2.5 mg total) by nebulization every 4 (four) hours as needed.   75 mL   6   . Ascorbic Acid (VITAMIN C PO)   Oral   Take 1 tablet by mouth daily. Unknown OTC strength         . Benzonatate (TESSALON PO)   Oral   Take 1 tablet by mouth as needed (coughing).         Marland Kitchen BIOTIN PO   Oral   Take 2,000 mg by mouth daily.         . budesonide-formoterol (SYMBICORT) 160-4.5 MCG/ACT inhaler   Inhalation   Inhale 2 puffs into the lungs 2 (two) times daily. With spacer for asthma control   3 Inhaler   3   . busPIRone (BUSPAR) 15 MG tablet   Oral   Take 1 tablet (15 mg total) by mouth 2 (two) times daily.   60 tablet   0   . chlorproMAZINE (THORAZINE) 25 MG tablet   Oral   Take 1 tablet (25 mg total) by mouth every 6 (six) hours as needed. For  nausea/migraine   30 tablet   6   . DULoxetine (CYMBALTA) 20 MG capsule   Oral   Take 1 capsule (20 mg total) by mouth daily.   30 capsule   0   . fexofenadine (ALLEGRA) 180 MG tablet   Oral   Take 1 tablet (180 mg total) by mouth every evening.   90 tablet   3   . fluticasone (FLONASE) 50 MCG/ACT nasal spray   Nasal   Place 2 sprays into the nose daily.   16 g   6   . ibuprofen (ADVIL,MOTRIN) 600 MG tablet   Oral   Take 1 tablet (600 mg total) by mouth every 6 (six) hours as needed for pain. For pain   60 tablet   2   . montelukast (SINGULAIR) 10 MG tablet   Oral   Take 1 tablet (10 mg total) by mouth at bedtime.   90 tablet   3   . ondansetron (ZOFRAN ODT) 4 MG disintegrating tablet   Oral   Take 1 tablet (4 mg total) by mouth every 6 (six) hours as needed. For nausea   20 tablet   0   . oxybutynin (DITROPAN-XL) 10 MG 24 hr tablet   Oral   Take 1 tablet (10 mg total) by mouth daily.   30 tablet   1   . pantoprazole (PROTONIX) 20 MG tablet   Oral   Take 1 tablet (20 mg total) by mouth daily.   30 tablet   0   . propranolol ER (INDERAL LA) 80 MG 24 hr capsule   Oral   Take 1 capsule (80 mg total) by mouth daily.   30 capsule   0   . Pyridoxine HCl (VITAMIN B-6 PO)   Oral   Take 1 tablet by mouth daily. Unknown OTC strength         . traMADol (ULTRAM) 50 MG tablet   Oral   Take 1 tablet (50 mg total) by mouth every 8 (eight) hours as needed for pain.   30 tablet   0   . traZODone (DESYREL) 50 MG tablet   Oral   Take 1 tablet (50 mg total) by mouth at bedtime.   30 tablet   0   . verapamil (  CALAN) 40 MG tablet   Oral   Take 40 mg by mouth as needed. When gets palpitations         . zolmitriptan (ZOMIG) 5 MG tablet   Oral   Take 1 tablet (5 mg total) by mouth as needed. For migraine   10 tablet   6   . Spacer/Aero-Holding Chambers (AEROCHAMBER PLUS) inhaler   Other   1 each by Other route once. Please provide patient with 1 spacer  for use with her inhalers Use as instructed           BP 110/63  Pulse 65  Temp(Src) 98.2 F (36.8 C) (Oral)  Resp 16  SpO2 100%  LMP 06/29/2013 Physical Exam  Nursing note and vitals reviewed. Constitutional: She is oriented to person, place, and time. She appears well-developed and well-nourished. No distress.  HENT:  Head: Normocephalic and atraumatic.  Cardiovascular: Normal rate, regular rhythm and normal heart sounds.   No murmur heard. Pulmonary/Chest: Effort normal and breath sounds normal. No respiratory distress. She has no wheezes.  Abdominal: Soft. Bowel sounds are normal. There is no tenderness.  Musculoskeletal:  Full range of motion of bilateral knees. Left greater than right knee effusions. There is also tenderness to palpation over the posterior left calf with moderate swelling noted. No pitting edema noted.  Neurological: She is alert and oriented to person, place, and time.  Skin: Skin is warm and dry.  Psychiatric: She has a normal mood and affect.    ED Course  Procedures (including critical care time) Labs Review Labs Reviewed  POCT I-STAT TROPONIN I   Imaging Review Dg Chest 2 View  07/17/2013   CLINICAL DATA:  Swelling and pain bilateral legs, history of asthma  EXAM: CHEST  2 VIEW  COMPARISON:  06/02/2010  FINDINGS: Cardiomediastinal silhouette is stable. No acute infiltrate or pleural effusion. No pulmonary edema. Bony thorax is stable.  IMPRESSION: No active cardiopulmonary disease.  No significant change.   Electronically Signed   By: Natasha Mead   On: 07/17/2013 08:26   Dg Knee Complete 4 Views Left  07/17/2013   CLINICAL DATA:  Left knee pain.  EXAM: LEFT KNEE - COMPLETE 4+ VIEW  COMPARISON:  None.  FINDINGS: No acute bony or joint abnormality is identified. There is no joint effusion. Tiny medial compartment osteophytes are noted.  IMPRESSION: Negative exam.   Electronically Signed   By: Drusilla Kanner M.D.   On: 07/17/2013 08:26   Dg Knee  Complete 4 Views Right  07/17/2013   CLINICAL DATA:  Left leg swelling.  EXAM: RIGHT KNEE - COMPLETE 4+ VIEW  COMPARISON:  None.  FINDINGS: No acute bony or joint abnormality is identified. There is no joint effusion. Small patellofemoral and medial compartment osteophytes are noted.  IMPRESSION: No acute or focal abnormality.  Mild degenerative change.   Electronically Signed   By: Drusilla Kanner M.D.   On: 07/17/2013 08:25     EKG independently reviewed by myself: Normal sinus rhythm with a rate of 67, Q waves in V1 and V2 which appear new from prior, no evidence of acute ischemia MDM   1. Dependent edema    This is a 47 rolled female who presents with left calf pain and knee pain. Patient reports returning to full-time work yesterday. This is the first day she had worked a full shift. She is low risk for DVT with a well score of 1 but does have tenderness to palpation and swelling  noted to the left lower extremity. Ultrasound is negative for DVT. On review of systems the patient endorses chronic shortness of breath. EKG was obtained and shows new anterior Q waves. Troponin is negative the patient is not having any chest pain. Chest x-ray is unremarkable.  The patient states that sheshortness of breath well and it is likely related to her not using her CPAP. However, this could be an anginal equivalent. She is otherwise low risk and had a stress test 2 years ago which she reports is negative.  Patient was encouraged followup with cardiology on Monday for repeat stress testing. She is to return she has any new or worsening symptoms.  After history, exam, and medical workup I feel the patient has been appropriately medically screened and is safe for discharge home. Pertinent diagnoses were discussed with the patient. Patient was given return precautions.   Shon Baton, MD 07/17/13 1018

## 2013-07-17 NOTE — ED Notes (Signed)
Pt still out of the department at this time 

## 2013-07-17 NOTE — Progress Notes (Signed)
Left lower extremity venous duplex completed.  Left:  No evidence of DVT, superficial thrombosis, or Baker's cyst.  Right:  Negative for DVT in the common femoral vein.  

## 2013-07-17 NOTE — ED Notes (Signed)
Pt taken from radiology to vascular.

## 2013-07-17 NOTE — ED Notes (Signed)
Pt reporting left knee pain with pain shooting down to left calf. Pain behind left knee. Pt works on her feet, denies hx of DVT. Sensation intact, pulse present. Pt took toradol for pain this morning. Pt is a x 4.

## 2013-07-20 ENCOUNTER — Encounter: Payer: Self-pay | Admitting: Cardiovascular Disease

## 2013-07-20 ENCOUNTER — Ambulatory Visit (INDEPENDENT_AMBULATORY_CARE_PROVIDER_SITE_OTHER): Payer: 59 | Admitting: Cardiovascular Disease

## 2013-07-20 ENCOUNTER — Encounter: Payer: Self-pay | Admitting: *Deleted

## 2013-07-20 VITALS — BP 122/80 | HR 57 | Ht 61.0 in | Wt 173.0 lb

## 2013-07-20 DIAGNOSIS — R0600 Dyspnea, unspecified: Secondary | ICD-10-CM | POA: Insufficient documentation

## 2013-07-20 DIAGNOSIS — R600 Localized edema: Secondary | ICD-10-CM

## 2013-07-20 DIAGNOSIS — R609 Edema, unspecified: Secondary | ICD-10-CM

## 2013-07-20 DIAGNOSIS — R0609 Other forms of dyspnea: Secondary | ICD-10-CM

## 2013-07-20 DIAGNOSIS — R0989 Other specified symptoms and signs involving the circulatory and respiratory systems: Secondary | ICD-10-CM

## 2013-07-20 NOTE — Assessment & Plan Note (Signed)
I presents today for followup visit. She presented to the emergency room with left leg edema. She had previously injured her knee. They performed a venous duplex scan and it was negative for DVT.  At that time she had an EKG which revealed poor R-wave progression. I suspect that the poor  R-wave progression was due to lead placement abnormalities.  She has no evidence of congestive heart failure.  We will get an echocardiogram further evaluation since he has had some leg edema and since there is a question of cardiac issues. I will  see her on an as-needed basis.

## 2013-07-20 NOTE — Patient Instructions (Addendum)
Your physician has requested that you have an echocardiogram. Echocardiography is a painless test that uses sound waves to create images of your heart. It provides your doctor with information about the size and shape of your heart and how well your heart's chambers and valves are working. This procedure takes approximately one hour. There are no restrictions for this procedure.  Your physician recommends that you schedule a follow-up appointment in: as needed basis  Your physician recommends that you continue on your current medications as directed. Please refer to the Current Medication list given to you today.

## 2013-07-20 NOTE — Assessment & Plan Note (Signed)
[name] presents today for further evaluation of some shortness of breath. She relates a recent dyspnea to the change of weather and has asthma. She has been taking her inhalers regularly. She recently has had some worsening leg edema primarily on the left. She was seen in the emergency room. She negative duplex scan at that time which ruled out DVT.  She also had an EKG which showed poor R-wave progression. I suspect that the abnormal EKG was due to lead placement although the emergency room Dr. had some concern about anterior ischemia and the development of congestive heart failure.  We'll get an echocardiogram to evaluate her left ventricular systolic function given her worsening dyspnea and this asymmetric  leg edema.  Temperature to exercise on regular basis. We will not schedule her for a return appointment but will see her as needed.

## 2013-07-20 NOTE — Progress Notes (Signed)
[name] Date of Birth  [birth year]       [facility]    [place] [address]  [address], [address] [place], [place]  [address]   [place], [place]  [address] [phone]     [phone]   Fax  [phone]    Fax [phone]  Problem List: 1. Asthma 2, Left leg swelling 3. Dyspnea 4. Obstructive sleep apnea-   History of Present Illness:  [name] is a 47 yo with hx of dyspnea and palpatations .  I saw her in the past for palpitations and she was last seen in 2012.  She has had some worsening dyspnea since the weather changed recently.  She has known asthma.  She was seen in the ER and was told that she had an abnormal ECG  (poor R wave progression due to lead placement by my intrepretation).  She has swelling in her left leg.  Duplex in the ER was negative for DVT.  She has no CP.  She has had some dyspnea and uses her inhaler.  She uses CPAP at night for OSA.     Current Outpatient Prescriptions on File Prior to Visit  Medication Sig Dispense Refill  . acetaminophen (TYLENOL) 500 MG tablet Take 1,000 mg by mouth every 6 (six) hours as needed. For pain      . albuterol (PROVENTIL HFA;VENTOLIN HFA) 108 (90 BASE) MCG/ACT inhaler Inhale 2 puffs into the lungs every 4 (four) hours as needed for wheezing. For SOB  18 g  3  . albuterol (PROVENTIL) (2.5 MG/3ML) 0.083% nebulizer solution Take 3 mLs (2.5 mg total) by nebulization every 4 (four) hours as needed.  75 mL  6  . Ascorbic Acid (VITAMIN C PO) Take 1 tablet by mouth daily. Unknown OTC strength      . Benzonatate (TESSALON PO) Take 1 tablet by mouth as needed (coughing).      Marland Kitchen BIOTIN PO Take 2,000 mg by mouth daily.      . budesonide-formoterol (SYMBICORT) 160-4.5 MCG/ACT inhaler Inhale 2 puffs into the lungs 2 (two) times daily. With spacer for asthma control  3 Inhaler  3  . busPIRone (BUSPAR) 15 MG tablet Take 1 tablet (15 mg total) by mouth 2 (two) times daily.  60 tablet  0  .  chlorproMAZINE (THORAZINE) 25 MG tablet Take 1 tablet (25 mg total) by mouth every 6 (six) hours as needed. For nausea/migraine  30 tablet  6  . DULoxetine (CYMBALTA) 20 MG capsule Take 1 capsule (20 mg total) by mouth daily.  30 capsule  0  . fexofenadine (ALLEGRA) 180 MG tablet Take 1 tablet (180 mg total) by mouth every evening.  90 tablet  3  . fluticasone (FLONASE) 50 MCG/ACT nasal spray Place 2 sprays into the nose daily.  16 g  6  . ibuprofen (ADVIL,MOTRIN) 600 MG tablet Take 1 tablet (600 mg total) by mouth every 6 (six) hours as needed for pain. For pain  60 tablet  2  . montelukast (SINGULAIR) 10 MG tablet Take 1 tablet (10 mg total) by mouth at bedtime.  90 tablet  3  . ondansetron (ZOFRAN ODT) 4 MG disintegrating tablet Take 1 tablet (4 mg total) by mouth every 6 (six) hours as needed. For nausea  20 tablet  0  . pantoprazole (PROTONIX) 20 MG tablet Take 1 tablet (20 mg total) by mouth daily.  30 tablet  0  . propranolol ER (  INDERAL LA) 80 MG 24 hr capsule Take 1 capsule (80 mg total) by mouth daily.  30 capsule  0  . Pyridoxine HCl (VITAMIN B-6 PO) Take 1 tablet by mouth daily. Unknown OTC strength      . Spacer/Aero-Holding Chambers (AEROCHAMBER PLUS) inhaler 1 each by Other route once. Please provide patient with 1 spacer for use with her inhalers Use as instructed       . traZODone (DESYREL) 50 MG tablet Take 1 tablet (50 mg total) by mouth at bedtime.  30 tablet  0  . verapamil (CALAN) 40 MG tablet Take 40 mg by mouth as needed. When gets palpitations      . zolmitriptan (ZOMIG) 5 MG tablet Take 1 tablet (5 mg total) by mouth as needed. For migraine  10 tablet  6  . [DISCONTINUED] medroxyPROGESTERone (DEPO-PROVERA) 150 MG/ML injection Inject 1 mL (150 mg total) into the muscle every 3 (three) months.  1 mL  0  . [DISCONTINUED] medroxyPROGESTERone (PROVERA) 10 MG tablet Take 2 tablets (20 mg total) by mouth daily.  60 tablet  2   No current facility-administered medications on file  prior to visit.    Allergies  Allergen Reactions  . Amitriptyline     Mood swings  . Fluticasone-Salmeterol Other (See Comments)    Thrush even with mouth rinsing; worsens cough  . Penicillins   . Ciprofloxacin Itching and Rash  . Macrobid [Nitrofurantoin Monohyd Macro] Itching  . Nitrofurantoin Rash  . Sulfamethoxazole W-Trimethoprim Rash    Past Medical History  Diagnosis Date  . Migraines   . Asthma   . Anemia   . Migraine   . Anxiety   . Depression   . Termination of pregnancy     x 2  . PONV (postoperative nausea and vomiting)   . Dysrhythmia     Irregular heat w/ asthma episodes and anxiety  . Sleep apnea     uses CPAP  . GERD (gastroesophageal reflux disease)   . Chronic kidney disease     frequent urination- tx w/ vesicare    Past Surgical History  Procedure Laterality Date  . Ablation    . Cesarean section      x3  . Mab      x 4  . Dilation and curettage of uterus  05/31/2005    MAB   . Tubal ligation  11/01/2006  . Leep    . Hernia repair  1990's  . Wisdom tooth extraction    . Mouth surgery      teeth extraction  . Hysteroscopy w/ endometrial ablation      History  Smoking status  . Former Smoker -- 0.25 packs/day for 15 years  . Types: Cigarettes  . Quit date: 02/16/2003  Smokeless tobacco  . Never Used    History  Alcohol Use  . Yes    Comment: socially    Family History  Problem Relation Age of Onset  . Diabetes Maternal Grandmother   . Anesthesia problems Neg Hx     Reviw of Systems:  Reviewed in the HPI.  All other systems are negative.  Physical Exam: Blood pressure 122/80, pulse 57, height 5\' 1"  (1.549 m), weight 173 lb (78.472 kg), last menstrual period 06/29/2013, SpO2 99.00%. General: Well developed, well nourished, in no acute distress.  Head: Normocephalic, atraumatic, sclera non-icteric, mucus membranes are moist,   Neck: Supple. Carotids are 2 + without bruits. No JVD   Lungs: Clear   Heart: RR, normal  S1,  S2  Abdomen: Soft, non-tender, non-distended with normal bowel sounds.  Msk:  Strength and tone are normal   Extremities: No clubbing or cyanosis. No edema.  Distal pedal pulses are 2+ and equal    Neuro: CN II - XII intact.  Alert and oriented X 3.   Psych:  Normal   ECG: NSR, poor R wave progression - due to V lead placement abnormality.  Assessment / Plan:

## 2013-07-30 ENCOUNTER — Ambulatory Visit (HOSPITAL_COMMUNITY): Payer: 59 | Attending: Cardiology | Admitting: Radiology

## 2013-07-30 ENCOUNTER — Other Ambulatory Visit (HOSPITAL_COMMUNITY): Payer: Self-pay | Admitting: Cardiovascular Disease

## 2013-07-30 DIAGNOSIS — I079 Rheumatic tricuspid valve disease, unspecified: Secondary | ICD-10-CM | POA: Insufficient documentation

## 2013-07-30 DIAGNOSIS — R0602 Shortness of breath: Secondary | ICD-10-CM | POA: Insufficient documentation

## 2013-07-30 DIAGNOSIS — J45909 Unspecified asthma, uncomplicated: Secondary | ICD-10-CM | POA: Insufficient documentation

## 2013-07-30 DIAGNOSIS — R0600 Dyspnea, unspecified: Secondary | ICD-10-CM

## 2013-07-30 DIAGNOSIS — E669 Obesity, unspecified: Secondary | ICD-10-CM | POA: Insufficient documentation

## 2013-07-30 DIAGNOSIS — R609 Edema, unspecified: Secondary | ICD-10-CM

## 2013-07-30 DIAGNOSIS — Z87891 Personal history of nicotine dependence: Secondary | ICD-10-CM | POA: Insufficient documentation

## 2013-07-30 DIAGNOSIS — I059 Rheumatic mitral valve disease, unspecified: Secondary | ICD-10-CM | POA: Insufficient documentation

## 2013-07-30 NOTE — Progress Notes (Signed)
Echocardiogram performed.  

## 2013-08-04 ENCOUNTER — Other Ambulatory Visit: Payer: Self-pay | Admitting: Family Medicine

## 2013-08-04 ENCOUNTER — Ambulatory Visit (INDEPENDENT_AMBULATORY_CARE_PROVIDER_SITE_OTHER): Payer: 59 | Admitting: Family Medicine

## 2013-08-04 ENCOUNTER — Encounter: Payer: Self-pay | Admitting: Family Medicine

## 2013-08-04 VITALS — BP 113/65 | HR 69 | Temp 98.8°F | Ht 61.0 in | Wt 174.9 lb

## 2013-08-04 DIAGNOSIS — M79609 Pain in unspecified limb: Secondary | ICD-10-CM

## 2013-08-04 DIAGNOSIS — M79605 Pain in left leg: Secondary | ICD-10-CM | POA: Insufficient documentation

## 2013-08-04 DIAGNOSIS — R5381 Other malaise: Secondary | ICD-10-CM

## 2013-08-04 DIAGNOSIS — E785 Hyperlipidemia, unspecified: Secondary | ICD-10-CM

## 2013-08-04 DIAGNOSIS — M79604 Pain in right leg: Secondary | ICD-10-CM

## 2013-08-04 LAB — LIPID PANEL
Cholesterol: 204 mg/dL — ABNORMAL HIGH (ref 0–200)
LDL Cholesterol: 125 mg/dL — ABNORMAL HIGH (ref 0–99)
Total CHOL/HDL Ratio: 3.5 Ratio
VLDL: 21 mg/dL (ref 0–40)

## 2013-08-04 LAB — CBC WITH DIFFERENTIAL/PLATELET
Basophils Relative: 1 % (ref 0–1)
Eosinophils Absolute: 0.1 10*3/uL (ref 0.0–0.7)
Eosinophils Relative: 3 % (ref 0–5)
HCT: 38.1 % (ref 36.0–46.0)
Hemoglobin: 13.3 g/dL (ref 12.0–15.0)
MCH: 31.8 pg (ref 26.0–34.0)
MCHC: 34.9 g/dL (ref 30.0–36.0)
MCV: 91.1 fL (ref 78.0–100.0)
Monocytes Absolute: 0.5 10*3/uL (ref 0.1–1.0)
Monocytes Relative: 13 % — ABNORMAL HIGH (ref 3–12)
Neutro Abs: 2.1 10*3/uL (ref 1.7–7.7)
RDW: 12.5 % (ref 11.5–15.5)

## 2013-08-04 LAB — BASIC METABOLIC PANEL
BUN: 14 mg/dL (ref 6–23)
Creat: 0.84 mg/dL (ref 0.50–1.10)
Potassium: 4.2 mEq/L (ref 3.5–5.3)

## 2013-08-04 NOTE — Progress Notes (Signed)
Subjective:     Patient ID: [name], female   DOB: 02/22/1966, 47 y.o.   MRN: [id number]  HPI Bilateral leg pain: Patient reports leg pain has been increasingly worse over the last few months. Initially she did report some edema bilaterally, but since has not appreciated anything currently. 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 She reports her leg pain is worse after she goes home and rests and tries to go to bed. If not as bad on the day she does not work. She has tried Tylenol and Advil which does not seem to help. He has been wearing her TED hose, but it is desiring a new pair. She states the pain feels like sharp prickly tingly feelings up her legs and they just ache.  Fatigue: Patient reports increased fatigue over the last month. She states she just gets very exhausted after a shift where she normally would not be exhausted. She does experience mild chest pain with some mild shortness of breath associated with her mild chest pain. She says it only occurs between 3 and 5 PM in the afternoon, on the day she works. She wonders if this is related to anxiety issues or stress. She recently had a full cardiac evaluation with echo that was normal. She states she has been gaining some weight over the past 2 months.   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 Review of Systems Negative, with the exception of above mentioned in HPI  Objective:   Physical Exam BP 113/65  Pulse 69  Temp(Src) 98.8 F (37.1 C) (Oral)  Ht 5\' 1"  (1.549 m)  Wt 174 lb 14.4 oz (79.334 kg)  BMI 33.06 kg/m2  LMP 06/29/2013 Gen: NAD.  HEENT: AT. [place]. Bilateral eyes without injections or icterus. MMM. Bilateral nares normal.  CV: RRR  Chest: CTAB, no wheeze or crackles Abd: Soft. Mildly obese. NTND. BS present. No Masses palpated.  Ext: No erythema. No edema. No TTP. Negative Homans Skin: No rashes, purpura or petechiae.   Neuro: Normal gait. PERLA. EOMi. Alert. Grossly intact.

## 2013-08-04 NOTE — Assessment & Plan Note (Signed)
Patient is currently not on a statin. She states he is fasting this morning, will obtain a cholesterol profile

## 2013-08-04 NOTE — Patient Instructions (Addendum)
Restless Legs Syndrome Restless legs syndrome is a movement disorder. It may also be called a sensori-motor disorder.  CAUSES  No one knows what specifically causes restless legs syndrome, but it tends to run in families. It is also more common in people with low iron, in pregnancy, in people who need dialysis, and those with nerve damage (neuropathy).Some medications may make restless legs syndrome worse.Those medications include drugs to treat high blood pressure, some heart conditions, nausea, colds, allergies, and depression. SYMPTOMS Symptoms include uncomfortable sensations in the legs. These leg sensations are worse during periods of inactivity or rest. They are also worse while sitting or lying down. Individuals that have the disorder describe sensations in the legs that feel like:  Pulling.  Drawing.  Crawling.  Worming.  Boring.  Tingling.  Pins and needles.  Prickling.  Pain. The sensations are usually accompanied by an overwhelming urge to move the legs. Sudden muscle jerks may also occur. Movement provides temporary relief from the discomfort. In rare cases, the arms may also be affected. Symptoms may interfere with going to sleep (sleep onset insomnia). Restless legs syndrome may also be related to periodic limb movement disorder (PLMD). PLMD is another more common motor disorder. It also causes interrupted sleep. The symptoms from PLMD usually occur most often when you are awake. TREATMENT  Treatment for restless legs syndrome is symptomatic. This means that the symptoms are treated.   Massage and cold compresses may provide temporary relief.  Walk, stretch, or take a cold or hot bath.  Get regular exercise and a good night's sleep.  Avoid caffeine, alcohol, nicotine, and medications that can make it worse.  Do activities that provide mental stimulation like discussions, needlework, and video games. These may be helpful if you are not able to walk or  stretch. Some medications are effective in relieving the symptoms. However, many of these medications have side effects. Ask your caregiver about medications that may help your symptoms. Correcting iron deficiency may improve symptoms for some patients. Document Released: 10/05/2002 Document Revised: 01/07/2012 Document Reviewed: 01/11/2011 [facility] Patient Information 2014 [place], [place].   Pleasure meeting you today Daylene. I would like to get some lab work today. In followup on that and the next one to 2 days as the results are completed. Will refrain from prescribing medication today, but we'll likely add a medication in the future if it instead of being restless leg syndrome or neuropathy.

## 2013-08-04 NOTE — Assessment & Plan Note (Signed)
History reports increased fatigue the last few months, with associated weight gain. She also has pain in her bilateral legs. I am testing a CBC today for a possibility of low iron/anemia. I also obtained a TSH to test thyroid function. Patient has a history of heavy menses

## 2013-08-04 NOTE — Assessment & Plan Note (Addendum)
The patient's history shows multiple complaints of chronic pain, and multiple areas of her body. She was recently seen in the ED for leg pain where they ruled out any DVT. Today's history and evaluation seems to be most consistent with restless leg syndrome, cannot rule out neuropathy or other organic causes at this time. Have ordered CBC, TSH, B12, Vitamin D and cholesterol panel. Pending lab results, we'll recommend medications. Have also given prescription today for 2 pairs of TED hose. Encouraged patient to wear these daily especially while at work. Keep her feet elevated when not at work. Always be sent to have a good peritoneal shoes with good support, and or shoe inserts. We'll followup with patient as lab results become available.

## 2013-08-05 ENCOUNTER — Encounter: Payer: Self-pay | Admitting: Family Medicine

## 2013-08-05 ENCOUNTER — Other Ambulatory Visit: Payer: Self-pay | Admitting: Family Medicine

## 2013-08-05 ENCOUNTER — Telehealth: Payer: Self-pay | Admitting: Family Medicine

## 2013-08-05 DIAGNOSIS — E785 Hyperlipidemia, unspecified: Secondary | ICD-10-CM

## 2013-08-05 LAB — VITAMIN D 25 HYDROXY (VIT D DEFICIENCY, FRACTURES): Vit D, 25-Hydroxy: 18 ng/mL — ABNORMAL LOW (ref 30–89)

## 2013-08-05 LAB — VITAMIN B12: Vitamin B-12: 467 pg/mL (ref 211–911)

## 2013-08-05 MED ORDER — VITAMIN D3 250 MCG (10000 UT) PO CAPS: 50000.0000 [IU] | ORAL_CAPSULE | ORAL | Status: DC

## 2013-08-05 NOTE — Telephone Encounter (Signed)
Please call patient and inform her, her lab results have revealed she has Vitamin D deficiency. This can cause some leg discomfort. I have called in a prescription for her to start for replacement. It will be 5 capsules once a week. We will need to repeat that lab test in 6 weeks. Her lab results were normal and I will mail them to her. Thanks.

## 2013-08-06 NOTE — Telephone Encounter (Signed)
LVM for patient to call back. ?

## 2013-08-10 ENCOUNTER — Telehealth: Payer: Self-pay | Admitting: Family Medicine

## 2013-08-10 ENCOUNTER — Other Ambulatory Visit: Payer: Self-pay

## 2013-08-10 DIAGNOSIS — Z1231 Encounter for screening mammogram for malignant neoplasm of breast: Secondary | ICD-10-CM

## 2013-08-10 NOTE — Telephone Encounter (Signed)
Pt called and would like a nurse or doctor to call her concerning her test results from last wee. She is at work and the number to call is [phone]. JW

## 2013-09-04 ENCOUNTER — Other Ambulatory Visit: Payer: Self-pay | Admitting: Family Medicine

## 2013-09-10 ENCOUNTER — Ambulatory Visit (INDEPENDENT_AMBULATORY_CARE_PROVIDER_SITE_OTHER): Payer: 59 | Admitting: Family Medicine

## 2013-09-10 ENCOUNTER — Encounter: Payer: Self-pay | Admitting: Family Medicine

## 2013-09-10 VITALS — BP 120/72 | HR 66 | Temp 98.6°F | Ht 61.0 in | Wt 176.0 lb

## 2013-09-10 DIAGNOSIS — R059 Cough, unspecified: Secondary | ICD-10-CM

## 2013-09-10 DIAGNOSIS — R05 Cough: Secondary | ICD-10-CM

## 2013-09-10 MED ORDER — HYDROCODONE-HOMATROPINE 5-1.5 MG PO TABS: ORAL_TABLET | ORAL | Status: DC

## 2013-09-10 NOTE — Assessment & Plan Note (Signed)
Likely secondary to viral illness. No physical exam findings concerning for PNA. Will treat with Hycodan.  Patient encourage to follow up if she worsens or fails to improve.

## 2013-09-10 NOTE — Patient Instructions (Signed)
It was nice seeing you today.  Your cough appears to be viral in nature.  I have prescribed a medication for your cough. Follow up with your PCP if you do not improve.

## 2013-09-10 NOTE — Progress Notes (Signed)
Subjective:     Patient ID: [name], female   DOB: 10/10/1966, 47 y.o.   MRN: [id number]  HPI 47 year old female presents for a same day appointment for cough.  1) Cough - She reports Dry cough for the past 1-2 weeks. - It is worse at night. - No sputum production.  No associated fevers.  She does report some chills and some shortness of breath.   - She has been requiring more albuterol in the past few weeks. - Tessalon Perles have helped her cough but it continues to persist.  Review of Systems Per HPI    Objective:   Physical Exam Exam: General: well appearing female in NAD. HEENT: NCAT. No pharyngeal erythema or tonsillar exudate. Normal TM's. Cardiovascular: RRR. No murmurs, rubs, or gallops. Respiratory: CTAB. No rales, rhonchi, or wheeze. Abdomen: soft, nontender, nondistended.     Assessment:     See Problem List    Plan:

## 2013-11-13 ENCOUNTER — Ambulatory Visit
Admission: RE | Admit: 2013-11-13 | Discharge: 2013-11-13 | Disposition: A | Discharge status: Home / Self Care | Payer: 59 | Source: Ambulatory Visit

## 2013-11-13 DIAGNOSIS — Z1231 Encounter for screening mammogram for malignant neoplasm of breast: Secondary | ICD-10-CM

## 2013-11-25 ENCOUNTER — Encounter: Payer: Self-pay | Admitting: Family Medicine

## 2013-11-25 ENCOUNTER — Ambulatory Visit (INDEPENDENT_AMBULATORY_CARE_PROVIDER_SITE_OTHER): Payer: 59 | Admitting: Family Medicine

## 2013-11-25 VITALS — BP 127/70 | HR 67 | Temp 98.3°F | Ht 61.0 in | Wt 177.0 lb

## 2013-11-25 DIAGNOSIS — R059 Cough, unspecified: Secondary | ICD-10-CM

## 2013-11-25 DIAGNOSIS — K5900 Constipation, unspecified: Secondary | ICD-10-CM | POA: Insufficient documentation

## 2013-11-25 DIAGNOSIS — E559 Vitamin D deficiency, unspecified: Secondary | ICD-10-CM | POA: Insufficient documentation

## 2013-11-25 DIAGNOSIS — J309 Allergic rhinitis, unspecified: Secondary | ICD-10-CM

## 2013-11-25 DIAGNOSIS — R05 Cough: Secondary | ICD-10-CM

## 2013-11-25 MED ORDER — CETIRIZINE HCL 10 MG PO TABS: 10.0000 mg | ORAL_TABLET | Freq: Every day | ORAL | Status: DC

## 2013-11-25 MED ORDER — POLYETHYLENE GLYCOL 3350 17 GM/SCOOP PO POWD: 17.0000 g | Freq: Two times a day (BID) | ORAL | Status: DC | PRN

## 2013-11-25 MED ORDER — BENZONATATE 200 MG PO CAPS: 200.0000 mg | ORAL_CAPSULE | Freq: Two times a day (BID) | ORAL | Status: DC | PRN

## 2013-11-25 NOTE — Progress Notes (Signed)
   Subjective:    Patient ID: [name], female    DOB: [birth year], 48 y.o.   MRN: [id number]  HPI  Cough: patient reports she's had a cough since she was seen here in the end of November. She is given codeine cough syrup which she is unable to tolerate. She states it makes her nauseous, but she is still having a cough and is desiring something else because of her.  Constipation:patient reports a feeling of not being able to empty her bowels. She doesn't really feel like she is constipated, her stools are not hard, occasionly she does have diarrhea. She states sometimes depends on the food she consumes, and notices the cafeteria food makes her have more frequent diarrhea. She experiences pain/cramping with this on occasions but not often.she has tried over-the-counter generic preparations of laxatives and enema. She denies any changes in color or bloody stools. No consistent foul odor. Family history of cancer in first-degree cousin at age 40, cousin passed of colon cancer. Patient had ablation in 2013, still having regular periods occurring just about 3 days in length every 30 days. She follows up with a gynecologist in the area and she is up-to-date on Pap smears which have been normal. LMP 11/10/2013  Seasonal allergies: Patient takes Allegra daily. He states that recently she's noticed that she still congested when she wakes up in the morning. Having headaches occasionally.  Vitamin D deficiency: last appointment in September patient was noticed to be vitamin D deficient. Patient states she's taking vitamin D now but she's not certain of the dose. It was recommended the patient take 50,000 units once a week for 6 weeks and then be retested. However she has not done this.She does not complain of leg pain today.  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 She states that she's always had a little bit of problems with pills, since she was a kid,  but she feels like it's getting a little bit worse. She's has noticed some foods getting stuck as well. She has had a chronic cough, see above. She does have a past history of GERD. No hemoptysis.   Review of Systems Negative, with the exception of above mentioned in HPI     Objective:   Physical Exam BP 127/70  Pulse 67  Temp(Src) 98.3 F (36.8 C) (Oral)  Ht 5\' 1"  (1.549 m)  Wt 177 lb (80.287 kg)  BMI 33.46 kg/m2 Gen: NAD. Talkative difficult to keep on track with questions. HEENT: AT. [place]. Bilateral TM visualized and normal in appearance. Bilateral eyes without injections or icterus. MMM. Bilateral nares with erythema, no swelling. Throat without erythema or exudates.  CV: RRR  Chest: CTAB, no wheeze or crackles Abd: Soft.  NTND. BS present. no Masses palpated.  Ext: No erythema. No edema.  Neuro: Normal gait. PERLA. EOMi. Alert. Grossly intact.

## 2013-11-25 NOTE — Assessment & Plan Note (Signed)
Repeat vitamin D level today.

## 2013-11-25 NOTE — Patient Instructions (Addendum)
Dysphagia Swallowing problems (dysphagia) occur when solids and liquids seem to stick in your throat on the way down to your stomach, or the food takes longer to get to the stomach. Other symptoms include regurgitating food, noises coming from the throat, chest discomfort with swallowing, and a feeling of fullness or the feeling of something being stuck in your throat when swallowing. When blockage in your throat is complete it may be associated with drooling. CAUSES  Problems with swallowing may occur because of problems with the muscles. The food cannot be propelled in the usual manner into your stomach. You may have ulcers, scar tissue, or inflammation in the tube down which food travels from your mouth to your stomach (esophagus), which blocks food from passing normally into the stomach. Causes of inflammation include:  Acid reflux from your stomach into your esophagus.  Infection.  Radiation treatment for cancer.  Medicines taken without enough fluids to wash them down into your stomach. You may have nerve problems that prevent signals from being sent to the muscles of your esophagus to contract and move your food down to your stomach. Globus pharyngeus is a relatively common problem in which there is a sense of an obstruction or difficulty in swallowing, without any physical abnormalities of the swallowing passages being found. This problem usually improves over time with reassurance and testing to rule out other causes. DIAGNOSIS Dysphagia can be diagnosed and its cause can be determined by tests in which you swallow a white substance that helps illuminate the inside of your throat (contrast medium) while X-rays are taken. Sometimes a flexible telescope that is inserted down your throat (endoscopy) to look at your esophagus and stomach is used. TREATMENT   If the dysphagia is caused by acid reflux or infection, medicines may be used.  If the dysphagia is caused by problems with your  swallowing muscles, swallowing therapy may be used to help you strengthen your swallowing muscles.  If the dysphagia is caused by a blockage or mass, procedures to remove the blockage may be done. HOME CARE INSTRUCTIONS  Try to eat soft food that is easier to swallow and check your weight on a daily basis to be sure that it is not decreasing.  Be sure to drink liquids when sitting upright (not lying down). SEEK MEDICAL CARE IF:  You are losing weight because you are unable to swallow.  You are coughing when you drink liquids (aspiration).  You are coughing up partially digested food. SEEK IMMEDIATE MEDICAL CARE IF:  You are unable to swallow your own saliva .  You are having shortness of breath or a fever, or both.  You have a hoarse voice along with difficulty swallowing. MAKE SURE YOU:  Understand these instructions.  Will watch your condition.  Will get help right away if you are not doing well or get worse. Document Released: 10/12/2000 Document Revised: 06/17/2013 Document Reviewed: 04/03/2013 [facility] Patient Information 2014 [place].  I will want to see you back in about a month if you notice you're still having problems with swallowing. I will also want to see you back sooner if your cough does not go away in the next couple weeks. If you still feel like you're unable to empty after starting MiraLax I will also want to see you back , And we may have to do some imaging studies. Discontinue the Allegra  and start the zyrtec.

## 2013-11-25 NOTE — Assessment & Plan Note (Signed)
Persistent cough since November. Treat with Ladona Ridgel today. Advised to patient that if cough is not completely gone within 2 weeks we will need to start looking for reasons for her to have a cough outside of a viral illness. Given the chest is clear on physical exam and no fever unlikely pneumonia setting. Concerned with possible worsening dysphasia if dysphagia is from your ability from cough or if they're connected. Would consider GERD etiology as a possibility if it does not resolve. Would consider start a PPI. If worsens would advised patient to see gastroenterology.

## 2013-11-25 NOTE — Assessment & Plan Note (Signed)
We'll discontinue Allegra and start Zyrtec today. Patient advised to followup as needed.

## 2013-11-25 NOTE — Assessment & Plan Note (Addendum)
Possible constipation. Advised patient to start miralax twice a day for the next few days and then once a day thereafter. If her symptoms do not improve and she still feels pressure pain as if she was retained feces, then she needs to come in for reevaluation. At that time she may need imaging. Pap smears are up-to-date and normal per patient. She sees gynecologist in the community. She is status post ablation therapy in 2013.

## 2013-11-26 ENCOUNTER — Encounter: Payer: Self-pay | Admitting: Family Medicine

## 2013-11-26 LAB — VITAMIN D 25 HYDROXY (VIT D DEFICIENCY, FRACTURES): Vit D, 25-Hydroxy: 10 ng/mL — ABNORMAL LOW (ref 30–89)

## 2013-12-07 ENCOUNTER — Telehealth: Payer: Self-pay | Admitting: Family Medicine

## 2013-12-07 ENCOUNTER — Ambulatory Visit (INDEPENDENT_AMBULATORY_CARE_PROVIDER_SITE_OTHER): Payer: 59 | Admitting: Family Medicine

## 2013-12-07 ENCOUNTER — Encounter: Payer: Self-pay | Admitting: Family Medicine

## 2013-12-07 VITALS — BP 120/70 | HR 80 | Temp 98.3°F | Ht 61.0 in | Wt 172.4 lb

## 2013-12-07 DIAGNOSIS — M7060 Trochanteric bursitis, unspecified hip: Secondary | ICD-10-CM | POA: Insufficient documentation

## 2013-12-07 DIAGNOSIS — M76899 Other specified enthesopathies of unspecified lower limb, excluding foot: Secondary | ICD-10-CM

## 2013-12-07 MED ORDER — METHYLPREDNISOLONE ACETATE 40 MG/ML IJ SUSP
40.0000 mg | Freq: Once | INTRAMUSCULAR | Status: AC
  Administered 2013-12-07: 40 mg via INTRA_ARTICULAR

## 2013-12-07 NOTE — Assessment & Plan Note (Signed)
Greater trochanteric bursitis of the right hip -Steroid injection performed as outlined in procedure note -Patient counseled to continue icing daily and may take Advil twice daily to help decrease inflammation

## 2013-12-07 NOTE — Addendum Note (Signed)
Addended byMauricia Area on: 12/07/2013 03:03 PM   Modules accepted: Orders

## 2013-12-07 NOTE — Patient Instructions (Signed)
Hip Bursitis  Bursitis is a swelling and soreness (inflammation) of a fluid-filled sac (bursa). This sac overlies and protects the joints.   CAUSES   · Injury.  · Overuse of the muscles surrounding the joint.  · Arthritis.  · Gout.  · Infection.  · Cold weather.  · Inadequate warm-up and conditioning prior to activities.  The cause may not be known.   SYMPTOMS   · Mild to severe irritation.  · Tenderness and swelling over the outside of the hip.  · Pain with motion of the hip.  · If the bursa becomes infected, a fever may be present. Redness, tenderness, and warmth will develop over the hip.  Symptoms usually lessen in 3 to 4 weeks with treatment, but can come back.  TREATMENT  If conservative treatment does not work, your caregiver may advise draining the bursa and injecting cortisone into the area. This may speed up the healing process. This may also be used as an initial treatment of choice.  HOME CARE INSTRUCTIONS   · Apply ice to the affected area for 15-20 minutes every 3 to 4 hours while awake for the first 2 days. Put the ice in a plastic bag and place a towel between the bag of ice and your skin.  · Rest the painful joint as much as possible, but continue to put the joint through a normal range of motion at least 4 times per day. When the pain lessens, begin normal, slow movements and usual activities to help prevent stiffness of the hip.  · Only take over-the-counter or prescription medicines for pain, discomfort, or fever as directed by your caregiver.  · Use crutches to limit weight bearing on the hip joint, if advised.  · Elevate your painful hip to reduce swelling. Use pillows for propping and cushioning your legs and hips.  · Gentle massage may provide comfort and decrease swelling.  SEEK IMMEDIATE MEDICAL CARE IF:   · Your pain increases even during treatment, or you are not improving.  · You have a fever.  · You have heat and inflammation over the involved bursa.  · You have any other questions or  concerns.  MAKE SURE YOU:   · Understand these instructions.  · Will watch your condition.  · Will get help right away if you are not doing well or get worse.  Document Released: 04/06/2002 Document Revised: 01/07/2012 Document Reviewed: 11/03/2008  [employer]® Patient Information ©2014 [employer].

## 2013-12-07 NOTE — Progress Notes (Signed)
   Subjective:    Patient ID: [name], female    DOB: 1966/02/25, 48 y.o.   MRN: [id number]  HPI 48 year old female presents for evaluation of right hip pain, patient has had pain in the area for approximately 2 days, majority of her pain is located over the lateral aspect of her right hip, does have a mild associated groin pain and right lumbar paraspinal pain, denies weakness in her extremities, denies numbness in her lower extremities, patient has a history of paresthesias and pain of her lower extremities that is well controlled with as TED hose, patient denies saddle anesthesia, she has attempted ibuprofen as well as heat and ice to the area with mild relief of her symptoms, pain is currently 7/10, she denies injury to the area   Review of Systems  Constitutional: Negative for fever, chills and fatigue.  Respiratory: Negative for shortness of breath.   Cardiovascular: Negative for chest pain and leg swelling.       Objective:   Physical Exam Vitals: Reviewed General: Anxious apparent African American female, no acute distress MSK: Tenderness to palpation over the right greater trochanter, muscle spasm and tenderness noted over the right lumbar paraspinal muscles, no midline back tenderness, no tenderness to palpation of the right knee or right ankle, strength testing was 5 of 5 to right knee flexion and extension and plantar and dorsiflexion of the right ankle, FABIR and FADIR testing of the right hip was unremarkable, logroll testing was negative for right hip Skin: no rash  Procedure note: The risks and benefits of a greater trochanteric bursa steroid injection were discussed with the patient, written consent was obtained, the right greater trochanter was palpated, the area was cleansed in a sterile fashion, 5 cc of 1% lidocaine and 40 mg of Depo-Medrol were injected using a 1-1/2 inch 25-gauge syringe, patient tolerated the procedure well, no bleeding was encountered, no  complications     Assessment & Plan:  Please see problem specific assessment and plan.

## 2013-12-07 NOTE — Telephone Encounter (Signed)
Dr. Raoul Pitch, [name] was seen in office today, she needs refills of all of her medications except Zyrtec,Miralax,and Tessalon Pearls

## 2013-12-07 NOTE — Telephone Encounter (Signed)
Please call [name] and have her call her pharmacy to call in what refills she needs. I will be happy to refill. Thanks.

## 2013-12-08 NOTE — Telephone Encounter (Signed)
patient was in clinic 2/9 afternoon and taken care of

## 2013-12-21 ENCOUNTER — Ambulatory Visit (INDEPENDENT_AMBULATORY_CARE_PROVIDER_SITE_OTHER): Payer: 59 | Admitting: Obstetrics & Gynecology

## 2013-12-21 ENCOUNTER — Encounter: Payer: Self-pay | Admitting: Obstetrics & Gynecology

## 2013-12-21 VITALS — BP 120/80 | HR 67 | Temp 97.5°F | Ht 61.0 in | Wt 175.0 lb

## 2013-12-21 DIAGNOSIS — Z01419 Encounter for gynecological examination (general) (routine) without abnormal findings: Secondary | ICD-10-CM

## 2013-12-21 DIAGNOSIS — N949 Unspecified condition associated with female genital organs and menstrual cycle: Secondary | ICD-10-CM

## 2013-12-21 DIAGNOSIS — R102 Pelvic and perineal pain: Secondary | ICD-10-CM

## 2013-12-21 NOTE — Progress Notes (Signed)
    GYNECOLOGY CLINIC ANNUAL PREVENTATIVE CARE ENCOUNTER NOTE  Subjective:     [name] is a 48 y.o. [phone] female here for a routine annual gynecologic exam.  Current complaints: significant RLQ pain around time of her period. No other concerns.     Gynecologic History Patient's last menstrual period was 12/08/2013. Contraception: tubal ligation Last Pap: 09/11/12. Results were: normal Last mammogram: 11/13/13. Results were: normal  Obstetric History OB History  Gravida Para Term Preterm AB SAB TAB Ectopic Multiple Living  8 4 4  0 4     4    # Outcome Date GA Lbr Len/2nd Weight Sex Delivery Anes PTL Lv  15 ABT           14 ABT           13 ABT           12 ABT           11 TRM      LTCS     10 TRM      LTCS     9 TRM      LTCS     8 TRM      SVD     7 SAB           6 SAB           5 TAB           4 TRM           3 TRM           2 TRM           1 TRM               The following portions of the patient's history were reviewed and updated as appropriate: allergies, current medications, past family history, past medical history, past social history, past surgical history and problem list.  Review of Systems Pertinent items are noted in HPI.    Objective:   BP 120/80  Pulse 67  Temp(Src) 97.5 F (36.4 C) (Oral)  Ht 5\' 1"  (1.549 m)  Wt 175 lb (79.379 kg)  BMI 33.08 kg/m2  LMP 12/08/2013 GENERAL: Well-developed, well-nourished female in no acute distress.  HEENT: Normocephalic, atraumatic. Sclerae anicteric.  NECK: Supple. Normal thyroid.  LUNGS: Clear to auscultation bilaterally.  HEART: Regular rate and rhythm. BREASTS: Symmetric in size. No masses, skin changes, nipple drainage, or lymphadenopathy. ABDOMEN: Soft, nontender, nondistended. No organomegaly. PELVIC: Normal external female genitalia. Vagina is pink and rugated.  Normal discharge. Normal cervix contour. Pap smear obtained. Uterus is normal in size. No adnexal mass or tenderness.  EXTREMITIES:  No cyanosis, clubbing, or edema, 2+ distal pulses.   Assessment:    Healthy female exam.  RLQ pain prior to menses   Plan:   Pap done, will follow up results and manage accordingly. Pelvic ultrasound ordered to coincide with expected time of next period; will follow up results and manage accordingly. Routine preventative health maintenance measures emphasized   Verita Schneiders, MD, [place] [facility] [place], [place]

## 2013-12-21 NOTE — Progress Notes (Signed)
Patient reports intense throbbing and pain a couple days before her period starts in her RLQ around her ovary.

## 2013-12-21 NOTE — Patient Instructions (Addendum)
Preventive Care for Adults, Female A healthy lifestyle and preventive care can promote health and wellness. Preventive health guidelines for women include the following key practices.  A routine yearly physical is a good way to check with your health care provider about your health and preventive screening. It is a chance to share any concerns and updates on your health and to receive a thorough exam.  Visit your dentist for a routine exam and preventive care every 6 months. Brush your teeth twice a day and floss once a day. Good oral hygiene prevents tooth decay and gum disease.  The frequency of eye exams is based on your age, health, family medical history, use of contact lenses, and other factors. Follow your health care provider's recommendations for frequency of eye exams.  Eat a healthy diet. Foods like vegetables, fruits, whole grains, low-fat dairy products, and lean protein foods contain the nutrients you need without too many calories. Decrease your intake of foods high in solid fats, added sugars, and salt. Eat the right amount of calories for you.Get information about a proper diet from your health care provider, if necessary.  Regular physical exercise is one of the most important things you can do for your health. Most adults should get at least 150 minutes of moderate-intensity exercise (any activity that increases your heart rate and causes you to sweat) each week. In addition, most adults need muscle-strengthening exercises on 2 or more days a week.  Maintain a healthy weight. The body mass index (BMI) is a screening tool to identify possible weight problems. It provides an estimate of body fat based on height and weight. Your health care provider can find your BMI, and can help you achieve or maintain a healthy weight.For adults 20 years and older:  A BMI below 18.5 is considered underweight.  A BMI of 18.5 to 24.9 is normal.  A BMI of 25 to 29.9 is considered  overweight.  A BMI of 30 and above is considered obese.  Maintain normal blood lipids and cholesterol levels by exercising and minimizing your intake of saturated fat. Eat a balanced diet with plenty of fruit and vegetables. Blood tests for lipids and cholesterol should begin at age 20 and be repeated every 5 years. If your lipid or cholesterol levels are high, you are over 50, or you are at high risk for heart disease, you may need your cholesterol levels checked more frequently.Ongoing high lipid and cholesterol levels should be treated with medicines if diet and exercise are not working.  If you smoke, find out from your health care provider how to quit. If you do not use tobacco, do not start.  Lung cancer screening is recommended for adults aged 55 80 years who are at high risk for developing lung cancer because of a history of smoking. A yearly low-dose CT scan of the lungs is recommended for people who have at least a 30-pack-year history of smoking and are a current smoker or have quit within the past 15 years. A pack year of smoking is smoking an average of 1 pack of cigarettes a day for 1 year (for example: 1 pack a day for 30 years or 2 packs a day for 15 years). Yearly screening should continue until the smoker has stopped smoking for at least 15 years. Yearly screening should be stopped for people who develop a health problem that would prevent them from having lung cancer treatment.  If you are pregnant, do not drink alcohol. If you   are breastfeeding, be very cautious about drinking alcohol. If you are not pregnant and choose to drink alcohol, do not have more than 1 drink per day. One drink is considered to be 12 ounces (355 mL) of beer, 5 ounces (148 mL) of wine, or 1.5 ounces (44 mL) of liquor.  Avoid use of street drugs. Do not share needles with anyone. Ask for help if you need support or instructions about stopping the use of drugs.  High blood pressure causes heart disease and  increases the risk of stroke. Your blood pressure should be checked at least every 1 to 2 years. Ongoing high blood pressure should be treated with medicines if weight loss and exercise do not work.  If you are 20 47 years old, ask your health care provider if you should take aspirin to prevent strokes.  Diabetes screening involves taking a blood sample to check your fasting blood sugar level. This should be done once every 3 years, after age 35, if you are within normal weight and without risk factors for diabetes. Testing should be considered at a younger age or be carried out more frequently if you are overweight and have at least 1 risk factor for diabetes.  Breast cancer screening is essential preventive care for women. You should practice "breast self-awareness." This means understanding the normal appearance and feel of your breasts and may include breast self-examination. Any changes detected, no matter how small, should be reported to a health care provider. Women in their 42s and 30s should have a clinical breast exam (CBE) by a health care provider as part of a regular health exam every 1 to 3 years. After age 74, women should have a CBE every year. Starting at age 43, women should consider having a mammogram (breast X-ray test) every year. Women who have a family history of breast cancer should talk to their health care provider about genetic screening. Women at a high risk of breast cancer should talk to their health care providers about having an MRI and a mammogram every year.  Breast cancer gene (BRCA)-related cancer risk assessment is recommended for women who have family members with BRCA-related cancers. BRCA-related cancers include breast, ovarian, tubal, and peritoneal cancers. Having family members with these cancers may be associated with an increased risk for harmful changes (mutations) in the breast cancer genes BRCA1 and BRCA2. Results of the assessment will determine the need for  genetic counseling and BRCA1 and BRCA2 testing.  The Pap test is a screening test for cervical cancer. A Pap test can show cell changes on the cervix that might become cervical cancer if left untreated. A Pap test is a procedure in which cells are obtained and examined from the lower end of the uterus (cervix).  Women should have a Pap test starting at age 60.  Between ages 63 and 62, Pap tests should be repeated every 2 years.  Beginning at age 43, you should have a Pap test every 3 years as long as the past 3 Pap tests have been normal.  Some women have medical problems that increase the chance of getting cervical cancer. Talk to your health care provider about these problems. It is especially important to talk to your health care provider if a new problem develops soon after your last Pap test. In these cases, your health care provider may recommend more frequent screening and Pap tests.  The above recommendations are the same for women who have or have not gotten the vaccine  for human papillomavirus (HPV).  If you had a hysterectomy for a problem that was not cancer or a condition that could lead to cancer, then you no longer need Pap tests. Even if you no longer need a Pap test, a regular exam is a good idea to make sure no other problems are starting.  If you are between ages 65 and 70 years, and you have had normal Pap tests going back 10 years, you no longer need Pap tests. Even if you no longer need a Pap test, a regular exam is a good idea to make sure no other problems are starting.  If you have had past treatment for cervical cancer or a condition that could lead to cancer, you need Pap tests and screening for cancer for at least 20 years after your treatment.  If Pap tests have been discontinued, risk factors (such as a new sexual partner) need to be reassessed to determine if screening should be resumed.  The HPV test is an additional test that may be used for cervical cancer  screening. The HPV test looks for the virus that can cause the cell changes on the cervix. The cells collected during the Pap test can be tested for HPV. The HPV test could be used to screen women aged 30 years and older, and should be used in women of any age who have unclear Pap test results. After the age of 30, women should have HPV testing at the same frequency as a Pap test.  Colorectal cancer can be detected and often prevented. Most routine colorectal cancer screening begins at the age of 50 years and continues through age 75 years. However, your health care provider may recommend screening at an earlier age if you have risk factors for colon cancer. On a yearly basis, your health care provider may provide home test kits to check for hidden blood in the stool. Use of a small camera at the end of a tube, to directly examine the colon (sigmoidoscopy or colonoscopy), can detect the earliest forms of colorectal cancer. Talk to your health care provider about this at age 50, when routine screening begins. Direct exam of the colon should be repeated every 5 10 years through age 75 years, unless early forms of pre-cancerous polyps or small growths are found.  People who are at an increased risk for hepatitis B should be screened for this virus. You are considered at high risk for hepatitis B if:  You were born in a country where hepatitis B occurs often. Talk with your health care provider about which countries are considered high risk.  Your parents were born in a high-risk country and you have not received a shot to protect against hepatitis B (hepatitis B vaccine).  You have HIV or AIDS.  You use needles to inject street drugs.  You live with, or have sex with, someone who has Hepatitis B.  You get hemodialysis treatment.  You take certain medicines for conditions like cancer, organ transplantation, and autoimmune conditions.  Hepatitis C blood testing is recommended for all people born from  1945 through 1965 and any individual with known risks for hepatitis C.  Practice safe sex. Use condoms and avoid high-risk sexual practices to reduce the spread of sexually transmitted infections (STIs). STIs include gonorrhea, chlamydia, syphilis, trichomonas, herpes, HPV, and human immunodeficiency virus (HIV). Herpes, HIV, and HPV are viral illnesses that have no cure. They can result in disability, cancer, and death. Sexually active women aged 25   years and younger should be checked for chlamydia. Older women with new or multiple partners should also be tested for chlamydia. Testing for other STIs is recommended if you are sexually active and at increased risk.  Osteoporosis is a disease in which the bones lose minerals and strength with aging. This can result in serious bone fractures or breaks. The risk of osteoporosis can be identified using a bone density scan. Women ages 65 years and over and women at risk for fractures or osteoporosis should discuss screening with their health care providers. Ask your health care provider whether you should take a calcium supplement or vitamin D to reduce the rate of osteoporosis.  Menopause can be associated with physical symptoms and risks. Hormone replacement therapy is available to decrease symptoms and risks. You should talk to your health care provider about whether hormone replacement therapy is right for you.  Use sunscreen. Apply sunscreen liberally and repeatedly throughout the day. You should seek shade when your shadow is shorter than you. Protect yourself by wearing long sleeves, pants, a wide-brimmed hat, and sunglasses year round, whenever you are outdoors.  Once a month, do a whole body skin exam, using a mirror to look at the skin on your back. Tell your health care provider of new moles, moles that have irregular borders, moles that are larger than a pencil eraser, or moles that have changed in shape or color.  Stay current with required  vaccines (immunizations).  Influenza vaccine. All adults should be immunized every year.  Tetanus, diphtheria, and acellular pertussis (Td, Tdap) vaccine. Pregnant women should receive 1 dose of Tdap vaccine during each pregnancy. The dose should be obtained regardless of the length of time since the last dose. Immunization is preferred during the 27th 36th week of gestation. An adult who has not previously received Tdap or who does not know her vaccine status should receive 1 dose of Tdap. This initial dose should be followed by tetanus and diphtheria toxoids (Td) booster doses every 10 years. Adults with an unknown or incomplete history of completing a 3-dose immunization series with Td-containing vaccines should begin or complete a primary immunization series including a Tdap dose. Adults should receive a Td booster every 10 years.  Varicella vaccine. An adult without evidence of immunity to varicella should receive 2 doses or a second dose if she has previously received 1 dose. Pregnant females who do not have evidence of immunity should receive the first dose after pregnancy. This first dose should be obtained before leaving the health care facility. The second dose should be obtained 4 8 weeks after the first dose.  Human papillomavirus (HPV) vaccine. Females aged 13 26 years who have not received the vaccine previously should obtain the 3-dose series. The vaccine is not recommended for use in pregnant females. However, pregnancy testing is not needed before receiving a dose. If a female is found to be pregnant after receiving a dose, no treatment is needed. In that case, the remaining doses should be delayed until after the pregnancy. Immunization is recommended for any person with an immunocompromised condition through the age of 26 years if she did not get any or all doses earlier. During the 3-dose series, the second dose should be obtained 4 8 weeks after the first dose. The third dose should be  obtained 24 weeks after the first dose and 16 weeks after the second dose.  Zoster vaccine. One dose is recommended for adults aged 60 years or older unless certain   conditions are present.  Measles, mumps, and rubella (MMR) vaccine. Adults born before 1957 generally are considered immune to measles and mumps. Adults born in 1957 or later should have 1 or more doses of MMR vaccine unless there is a contraindication to the vaccine or there is laboratory evidence of immunity to each of the three diseases. A routine second dose of MMR vaccine should be obtained at least 28 days after the first dose for students attending postsecondary schools, health care workers, or international travelers. People who received inactivated measles vaccine or an unknown type of measles vaccine during 1963 1967 should receive 2 doses of MMR vaccine. People who received inactivated mumps vaccine or an unknown type of mumps vaccine before 1979 and are at high risk for mumps infection should consider immunization with 2 doses of MMR vaccine. For females of childbearing age, rubella immunity should be determined. If there is no evidence of immunity, females who are not pregnant should be vaccinated. If there is no evidence of immunity, females who are pregnant should delay immunization until after pregnancy. Unvaccinated health care workers born before 1957 who lack laboratory evidence of measles, mumps, or rubella immunity or laboratory confirmation of disease should consider measles and mumps immunization with 2 doses of MMR vaccine or rubella immunization with 1 dose of MMR vaccine.  Pneumococcal 13-valent conjugate (PCV13) vaccine. When indicated, a person who is uncertain of her immunization history and has no record of immunization should receive the PCV13 vaccine. An adult aged 19 years or older who has certain medical conditions and has not been previously immunized should receive 1 dose of PCV13 vaccine. This PCV13 should be  followed with a dose of pneumococcal polysaccharide (PPSV23) vaccine. The PPSV23 vaccine dose should be obtained at least 8 weeks after the dose of PCV13 vaccine. An adult aged 19 years or older who has certain medical conditions and previously received 1 or more doses of PPSV23 vaccine should receive 1 dose of PCV13. The PCV13 vaccine dose should be obtained 1 or more years after the last PPSV23 vaccine dose.  Pneumococcal polysaccharide (PPSV23) vaccine. When PCV13 is also indicated, PCV13 should be obtained first. All adults aged 65 years and older should be immunized. An adult younger than age 65 years who has certain medical conditions should be immunized. Any person who resides in a nursing home or long-term care facility should be immunized. An adult smoker should be immunized. People with an immunocompromised condition and certain other conditions should receive both PCV13 and PPSV23 vaccines. People with human immunodeficiency virus (HIV) infection should be immunized as soon as possible after diagnosis. Immunization during chemotherapy or radiation therapy should be avoided. Routine use of PPSV23 vaccine is not recommended for American Indians, [place] Natives, or people younger than 65 years unless there are medical conditions that require PPSV23 vaccine. When indicated, people who have unknown immunization and have no record of immunization should receive PPSV23 vaccine. One-time revaccination 5 years after the first dose of PPSV23 is recommended for people aged 19 64 years who have chronic kidney failure, nephrotic syndrome, asplenia, or immunocompromised conditions. People who received 1 2 doses of PPSV23 before age 65 years should receive another dose of PPSV23 vaccine at age 65 years or later if at least 5 years have passed since the previous dose. Doses of PPSV23 are not needed for people immunized with PPSV23 at or after age 65 years.  Meningococcal vaccine. Adults with asplenia or persistent  complement component deficiencies should receive 2   doses of quadrivalent meningococcal conjugate (MenACWY-D) vaccine. The doses should be obtained at least 2 months apart. Microbiologists working with certain meningococcal bacteria, military recruits, people at risk during an outbreak, and people who travel to or live in countries with a high rate of meningitis should be immunized. A first-year college student up through age 21 years who is living in a residence hall should receive a dose if she did not receive a dose on or after her 16th birthday. Adults who have certain high-risk conditions should receive one or more doses of vaccine.  Hepatitis A vaccine. Adults who wish to be protected from this disease, have certain high-risk conditions, work with hepatitis A-infected animals, work in hepatitis A research labs, or travel to or work in countries with a high rate of hepatitis A should be immunized. Adults who were previously unvaccinated and who anticipate close contact with an international adoptee during the first 60 days after arrival in the [place] from a country with a high rate of hepatitis A should be immunized.  Hepatitis B vaccine. Adults who wish to be protected from this disease, have certain high-risk conditions, may be exposed to blood or other infectious body fluids, are household contacts or sex partners of hepatitis B positive people, are clients or workers in certain care facilities, or travel to or work in countries with a high rate of hepatitis B should be immunized.  Haemophilus influenzae type b (Hib) vaccine. A previously unvaccinated person with asplenia or sickle cell disease or having a scheduled splenectomy should receive 1 dose of Hib vaccine. Regardless of previous immunization, a recipient of a hematopoietic stem cell transplant should receive a 3-dose series 6 12 months after her successful transplant. Hib vaccine is not recommended for adults with HIV  infection. Preventive Services / Frequency Ages 19 to 39years  Blood pressure check.** / Every 1 to 2 years.  Lipid and cholesterol check.** / Every 5 years beginning at age 20.  Clinical breast exam.** / Every 3 years for women in their 20s and 30s.  BRCA-related cancer risk assessment.** / For women who have family members with a BRCA-related cancer (breast, ovarian, tubal, or peritoneal cancers).  Pap test.** / Every 2 years from ages 21 through 29. Every 3 years starting at age 30 through age 65 or 70 with a history of 3 consecutive normal Pap tests.  HPV screening.** / Every 3 years from ages 30 through ages 65 to 70 with a history of 3 consecutive normal Pap tests.  Hepatitis C blood test.** / For any individual with known risks for hepatitis C.  Skin self-exam. / Monthly.  Influenza vaccine. / Every year.  Tetanus, diphtheria, and acellular pertussis (Tdap, Td) vaccine.** / Consult your health care provider. Pregnant women should receive 1 dose of Tdap vaccine during each pregnancy. 1 dose of Td every 10 years.  Varicella vaccine.** / Consult your health care provider. Pregnant females who do not have evidence of immunity should receive the first dose after pregnancy.  HPV vaccine. / 3 doses over 6 months, if 26 and younger. The vaccine is not recommended for use in pregnant females. However, pregnancy testing is not needed before receiving a dose.  Measles, mumps, rubella (MMR) vaccine.** / You need at least 1 dose of MMR if you were born in 1957 or later. You may also need a 2nd dose. For females of childbearing age, rubella immunity should be determined. If there is no evidence of immunity, females who are not   pregnant should be vaccinated. If there is no evidence of immunity, females who are pregnant should delay immunization until after pregnancy.  Pneumococcal 13-valent conjugate (PCV13) vaccine.** / Consult your health care provider.  Pneumococcal polysaccharide (PPSV23)  vaccine.** / 1 to 2 doses if you smoke cigarettes or if you have certain conditions.  Meningococcal vaccine.** / 1 dose if you are age 88 to 41 years and a Market researcher living in a residence hall, or have one of several medical conditions, you need to get vaccinated against meningococcal disease. You may also need additional booster doses.  Hepatitis A vaccine.** / Consult your health care provider.  Hepatitis B vaccine.** / Consult your health care provider.  Haemophilus influenzae type b (Hib) vaccine.** / Consult your health care provider. Ages 45 to 64years  Blood pressure check.** / Every 1 to 2 years.  Lipid and cholesterol check.** / Every 5 years beginning at age 2 years.  Lung cancer screening. / Every year if you are aged 10 80 years and have a 30-pack-year history of smoking and currently smoke or have quit within the past 15 years. Yearly screening is stopped once you have quit smoking for at least 15 years or develop a health problem that would prevent you from having lung cancer treatment.  Clinical breast exam.** / Every year after age 28 years.  BRCA-related cancer risk assessment.** / For women who have family members with a BRCA-related cancer (breast, ovarian, tubal, or peritoneal cancers).  Mammogram.** / Every year beginning at age 63 years and continuing for as long as you are in good health. Consult with your health care provider.  Pap test.** / Every 3 years starting at age 71 years through age 39 or 90 years with a history of 3 consecutive normal Pap tests.  HPV screening.** / Every 3 years from ages 27 years through ages 32 to 72 years with a history of 3 consecutive normal Pap tests.  Fecal occult blood test (FOBT) of stool. / Every year beginning at age 40 years and continuing until age 29 years. You may not need to do this test if you get a colonoscopy every 10 years.  Flexible sigmoidoscopy or colonoscopy.** / Every 5 years for a flexible  sigmoidoscopy or every 10 years for a colonoscopy beginning at age 66 years and continuing until age 47 years.  Hepatitis C blood test.** / For all people born from [birth year] through 1964 and any individual with known risks for hepatitis C.  Skin self-exam. / Monthly.  Influenza vaccine. / Every year.  Tetanus, diphtheria, and acellular pertussis (Tdap/Td) vaccine.** / Consult your health care provider. Pregnant women should receive 1 dose of Tdap vaccine during each pregnancy. 1 dose of Td every 10 years.  Varicella vaccine.** / Consult your health care provider. Pregnant females who do not have evidence of immunity should receive the first dose after pregnancy.  Zoster vaccine.** / 1 dose for adults aged 39 years or older.  Measles, mumps, rubella (MMR) vaccine.** / You need at least 1 dose of MMR if you were born in 1957 or later. You may also need a 2nd dose. For females of childbearing age, rubella immunity should be determined. If there is no evidence of immunity, females who are not pregnant should be vaccinated. If there is no evidence of immunity, females who are pregnant should delay immunization until after pregnancy.  Pneumococcal 13-valent conjugate (PCV13) vaccine.** / Consult your health care provider.  Pneumococcal polysaccharide (PPSV23) vaccine.** / 1  to 2 doses if you smoke cigarettes or if you have certain conditions.  Meningococcal vaccine.** / Consult your health care provider.  Hepatitis A vaccine.** / Consult your health care provider.  Hepatitis B vaccine.** / Consult your health care provider.  Haemophilus influenzae type b (Hib) vaccine.** / Consult your health care provider. Ages 65 years and over  Blood pressure check.** / Every 1 to 2 years.  Lipid and cholesterol check.** / Every 5 years beginning at age 15 years.  Lung cancer screening. / Every year if you are aged 61 80 years and have a 30-pack-year history of smoking and currently smoke or have quit  within the past 15 years. Yearly screening is stopped once you have quit smoking for at least 15 years or develop a health problem that would prevent you from having lung cancer treatment.  Clinical breast exam.** / Every year after age 48 years.  BRCA-related cancer risk assessment.** / For women who have family members with a BRCA-related cancer (breast, ovarian, tubal, or peritoneal cancers).  Mammogram.** / Every year beginning at age 52 years and continuing for as long as you are in good health. Consult with your health care provider.  Pap test.** / Every 3 years starting at age 67 years through age 28 or 19 years with 3 consecutive normal Pap tests. Testing can be stopped between 65 and 70 years with 3 consecutive normal Pap tests and no abnormal Pap or HPV tests in the past 10 years.  HPV screening.** / Every 3 years from ages 37 years through ages 95 or 32 years with a history of 3 consecutive normal Pap tests. Testing can be stopped between 65 and 70 years with 3 consecutive normal Pap tests and no abnormal Pap or HPV tests in the past 10 years.  Fecal occult blood test (FOBT) of stool. / Every year beginning at age 54 years and continuing until age 58 years. You may not need to do this test if you get a colonoscopy every 10 years.  Flexible sigmoidoscopy or colonoscopy.** / Every 5 years for a flexible sigmoidoscopy or every 10 years for a colonoscopy beginning at age 3 years and continuing until age 55 years.  Hepatitis C blood test.** / For all people born from [birth year] through 1964 and any individual with known risks for hepatitis C.  Osteoporosis screening.** / A one-time screening for women ages 33 years and over and women at risk for fractures or osteoporosis.  Skin self-exam. / Monthly.  Influenza vaccine. / Every year.  Tetanus, diphtheria, and acellular pertussis (Tdap/Td) vaccine.** / 1 dose of Td every 10 years.  Varicella vaccine.** / Consult your health care  provider.  Zoster vaccine.** / 1 dose for adults aged 43 years or older.  Pneumococcal 13-valent conjugate (PCV13) vaccine.** / Consult your health care provider.  Pneumococcal polysaccharide (PPSV23) vaccine.** / 1 dose for all adults aged 6 years and older.  Meningococcal vaccine.** / Consult your health care provider.  Hepatitis A vaccine.** / Consult your health care provider.  Hepatitis B vaccine.** / Consult your health care provider.  Haemophilus influenzae type b (Hib) vaccine.** / Consult your health care provider. ** Family history and personal history of risk and conditions may change your health care provider's recommendations. Document Released: 12/11/2001 Document Revised: 08/05/2013 Document Reviewed: 03/12/2011 [facility] Patient Information 2014 [place], [place]. RE: MyChart  Dear Ms. Durwin Glaze  We are excited to introduce MyChart, a new best-in-class service that provides you online access to important information  in your electronic medical record. We want to make it easier for you to view your health information - all in one secure location - when and where you need it. We expect MyChart will enhance the quality of care and service we provide. Use the activation code below to enroll in MyChart online at [email or web address]  When you register for MyChart, you can:    View your test results.   Communicate securely with your physician's office.    View your medical history, allergies, medications, and immunizations.   Conveniently print information such as your medication lists.  If you are age 24 or older and want a member of your family to have access to your record, you must provide written consent by completing a proxy form available at our facility. Please speak to our clinical staff about guidelines regarding accounts for patients younger than age 60.  As you activate your MyChart account and need any technical assistance, please call the MyChart technical  support line at [phone]) or email your question to [email or web address]. If you email your question(s), please include your name, a return phone number and the best time to reach you.  Thank you for using MyChart as your new health and wellness resource!  MyChart Activation Code:  [email or web address] Expires: 01/24/2014  9:15 AM

## 2014-01-05 ENCOUNTER — Ambulatory Visit (HOSPITAL_COMMUNITY)
Admission: RE | Admit: 2014-01-05 | Discharge: 2014-01-05 | Disposition: A | Discharge status: Home / Self Care | Payer: 59 | Source: Ambulatory Visit | Attending: Obstetrics & Gynecology | Admitting: Obstetrics & Gynecology

## 2014-01-05 DIAGNOSIS — N8000 Endometriosis of the uterus, unspecified: Secondary | ICD-10-CM | POA: Insufficient documentation

## 2014-01-05 DIAGNOSIS — R102 Pelvic and perineal pain: Secondary | ICD-10-CM

## 2014-01-05 DIAGNOSIS — D259 Leiomyoma of uterus, unspecified: Secondary | ICD-10-CM | POA: Insufficient documentation

## 2014-01-05 DIAGNOSIS — N949 Unspecified condition associated with female genital organs and menstrual cycle: Secondary | ICD-10-CM | POA: Insufficient documentation

## 2014-02-23 ENCOUNTER — Encounter: Payer: Self-pay | Admitting: Family Medicine

## 2014-02-23 ENCOUNTER — Ambulatory Visit (INDEPENDENT_AMBULATORY_CARE_PROVIDER_SITE_OTHER): Payer: 59 | Admitting: Family Medicine

## 2014-02-23 VITALS — BP 134/67 | HR 69 | Temp 99.6°F | Ht 61.0 in | Wt 177.0 lb

## 2014-02-23 DIAGNOSIS — R300 Dysuria: Secondary | ICD-10-CM

## 2014-02-23 DIAGNOSIS — R350 Frequency of micturition: Secondary | ICD-10-CM

## 2014-02-23 LAB — POCT URINALYSIS DIPSTICK
BILIRUBIN UA: NEGATIVE
Glucose, UA: NEGATIVE
KETONES UA: NEGATIVE
Leukocytes, UA: NEGATIVE
Nitrite, UA: NEGATIVE
PH UA: 7
Protein, UA: NEGATIVE
RBC UA: NEGATIVE
SPEC GRAV UA: 1.02
Urobilinogen, UA: 0.2

## 2014-02-23 MED ORDER — PHENAZOPYRIDINE HCL 100 MG PO TABS: 100.0000 mg | ORAL_TABLET | Freq: Three times a day (TID) | ORAL | Status: DC | PRN

## 2014-02-23 MED ORDER — CEPHALEXIN 500 MG PO CAPS: 500.0000 mg | ORAL_CAPSULE | Freq: Four times a day (QID) | ORAL | Status: DC

## 2014-02-23 NOTE — Patient Instructions (Signed)
Urinary Tract Infection Urinary tract infections (UTIs) can develop anywhere along your urinary tract. Your urinary tract is your body's drainage system for removing wastes and extra water. Your urinary tract includes two kidneys, two ureters, a bladder, and a urethra. Your kidneys are a pair of bean-shaped organs. Each kidney is about the size of your fist. They are located below your ribs, one on each side of your spine. CAUSES Infections are caused by microbes, which are microscopic organisms, including fungi, viruses, and bacteria. These organisms are so small that they can only be seen through a microscope. Bacteria are the microbes that most commonly cause UTIs. SYMPTOMS  Symptoms of UTIs may vary by age and gender of the patient and by the location of the infection. Symptoms in young women typically include a frequent and intense urge to urinate and a painful, burning feeling in the bladder or urethra during urination. Older women and men are more likely to be tired, shaky, and weak and have muscle aches and abdominal pain. A fever may mean the infection is in your kidneys. Other symptoms of a kidney infection include pain in your back or sides below the ribs, nausea, and vomiting. DIAGNOSIS To diagnose a UTI, your caregiver will ask you about your symptoms. Your caregiver also will ask to provide a urine sample. The urine sample will be tested for bacteria and white blood cells. White blood cells are made by your body to help fight infection. TREATMENT  Typically, UTIs can be treated with medication. Because most UTIs are caused by a bacterial infection, they usually can be treated with the use of antibiotics. The choice of antibiotic and length of treatment depend on your symptoms and the type of bacteria causing your infection. HOME CARE INSTRUCTIONS  If you were prescribed antibiotics, take them exactly as your caregiver instructs you. Finish the medication even if you feel better after you  have only taken some of the medication.  Drink enough water and fluids to keep your urine clear or pale yellow.  Avoid caffeine, tea, and carbonated beverages. They tend to irritate your bladder.  Empty your bladder often. Avoid holding urine for long periods of time.  Empty your bladder before and after sexual intercourse.  After a bowel movement, women should cleanse from front to back. Use each tissue only once. SEEK MEDICAL CARE IF:   You have back pain.  You develop a fever.  Your symptoms do not begin to resolve within 3 days. SEEK IMMEDIATE MEDICAL CARE IF:   You have severe back pain or lower abdominal pain.  You develop chills.  You have nausea or vomiting.  You have continued burning or discomfort with urination. MAKE SURE YOU:   Understand these instructions.  Will watch your condition.  Will get help right away if you are not doing well or get worse. Document Released: 07/25/2005 Document Revised: 04/15/2012 Document Reviewed: 11/23/2011 [facility] Patient Information 2014 [place].   Alcoholic beverages Apples and apple juice Cantaloupe Carbonated beverages Chili and spicy foods Chocolate Citrus fruit Coffee (including decaffeinated) Cranberries and cranberry juice Grapes Guava Milk Products: milk, cheese, cottage cheese, yogurt, ice cream Peaches Pineapple Plums Strawberries Sugar especially artificial sweeteners, saccharin, aspartame, corn sweeteners, honey, fructose, sucrose, lactose Tea Tomatoes and tomato juice Vitamin B complex Vinegar *Most people are not sensitive to ALL of these products; your goal is to find the foods that make YOUR irritant.

## 2014-02-24 ENCOUNTER — Encounter: Payer: Self-pay | Admitting: Family Medicine

## 2014-02-24 LAB — URINE CULTURE
Colony Count: NO GROWTH
Organism ID, Bacteria: NO GROWTH

## 2014-02-24 NOTE — Progress Notes (Signed)
   Subjective:    Patient ID: [name], female    DOB: [birth year], 49 y.o.   MRN: [id number]  HPI [name] is a 48 y.o. african [place] female with a history of recurrent urinary tract infections presented to [facility] with complaints of frequency of urination.   Urinary frequency: Pt reports urinary frequency since Saturday (4 days ago). She denies dysuria. She endorses urinary pressure, bladder spasms with pain, and lower right sided flank pain that radiates to suprapubic area. She endorses a low grade fever on Saturday, but afebrile since. She experienced nausea as well on Saturday. She is experiencing fatigue. She has been able to tolerate PO. She has not tried to take anything for the pain. She denies hematuria or odor to her urine. She states her urine is darker than usual and she has been drinking water regularly.   Last UTI Sept.2014. All documented UTI have been pan sensitive E.Coli >100K on cultures with normal UA analysis.   Patient's past medical, social, and family history were reviewed and updated as appropriate. Review of Systems Negative, with the exception of above mentioned in HPI  Objective:   Physical Exam BP 134/67  Pulse 69  Temp(Src) 99.6 F (37.6 C) (Oral)  Ht 5\' 1"  (1.549 m)  Wt 177 lb (80.287 kg)  BMI 33.46 kg/m2 Gen: well developed, well nourished , mildly obese AA female. No acute distress, non-toxic in appearance.  CV: RRR, no murmur appreciated.  Lungs: CTAB, no wheeze, crackles or rubs. ABD: Soft, NTND, BS present,no masses palpated.  MSK: No CVA tenderness bilaterally

## 2014-02-24 NOTE — Assessment & Plan Note (Signed)
UA obtained today is without leuk, nitrites or RBC. Considering it is identical to last specimen in 2014, which was >100K E.Coli on culture, and she is symptomatic will send specimen for culture and treat with keflex and pyridium for comfort.  -will call pt with results

## 2014-02-25 ENCOUNTER — Telehealth: Payer: Self-pay | Admitting: Family Medicine

## 2014-02-25 NOTE — Telephone Encounter (Signed)
Please call [place] and inform her that her urine culture was negative and had no growth. She can stop the antibiotics. She should attempt to refrain from bladder irritants from the list I gave her and continue her ditropan. Thanks.

## 2014-02-26 NOTE — Telephone Encounter (Signed)
Relayed message,patient voiced understanding.[place]

## 2014-03-08 ENCOUNTER — Encounter: Payer: Self-pay | Admitting: Family Medicine

## 2014-03-08 ENCOUNTER — Telehealth: Payer: Self-pay | Admitting: Family Medicine

## 2014-03-08 ENCOUNTER — Telehealth: Payer: Self-pay | Admitting: *Deleted

## 2014-03-08 NOTE — Telephone Encounter (Signed)
Unable to reach patient.her voicemail states it's full and not able to leave message.will wait till patient calls back.[place]

## 2014-03-08 NOTE — Telephone Encounter (Signed)
FMLA form and paperwork placed in Dr Lucita Lora box for completion.[place]

## 2014-03-08 NOTE — Progress Notes (Unsigned)
Patient dropped off FMLA forms to be filled out.  Please fax when completed and also call patient to pick up copy for herself.

## 2014-03-08 NOTE — Telephone Encounter (Signed)
It would be beneficial for [name] to be present to fill out paperwork for her FMLA. She will need to give me an idea of requirement, regular work schedule, expectations, etc. I wan to be certain this is worded correctly for her and approved by her supervisor. Thanks.

## 2014-03-12 ENCOUNTER — Encounter: Payer: Self-pay | Admitting: Family Medicine

## 2014-03-12 ENCOUNTER — Ambulatory Visit (INDEPENDENT_AMBULATORY_CARE_PROVIDER_SITE_OTHER): Payer: 59 | Admitting: Family Medicine

## 2014-03-12 VITALS — BP 120/82 | HR 70 | Temp 98.3°F | Ht 61.0 in | Wt 172.5 lb

## 2014-03-12 DIAGNOSIS — G43909 Migraine, unspecified, not intractable, without status migrainosus: Secondary | ICD-10-CM

## 2014-03-12 MED ORDER — TOPIRAMATE 25 MG PO TABS: ORAL_TABLET | ORAL | Status: DC

## 2014-03-12 NOTE — Patient Instructions (Signed)
I will complete your paperwork and have it filed by Monday afternoon.  Please start Topamax as prescribed.  I will refer to the headache clinic for migraine management.

## 2014-03-12 NOTE — Progress Notes (Signed)
   Subjective:    Patient ID: [name], female    DOB: 02-Dec-1965, 48 y.o.   MRN: [id number]  HPI [name] is a 48 y.o. african american female present to [facility] today with request of FMLA paperwork for migraine and asthma.  Patient's request FMLA paperwork to be completed for migraines and asthma, both of which are chronic conditions requiring treatment. Both of these chronic conditions will require periodic visits by health care provider.  Dates of leave: Intermittent basis due to condition. Patient currently works 36 hours per week.  Migraines: Chronic condition requiring intermittent absence. Patient states she suffered from migraine since 2004. Patient states her current migraine triggers are active cough, stress, unhealthy foods such as greasy foods. She states with her migraines she is nauseated, vomiting, changes in her vision and photophobia, that are only improved with medications and rest. Patient's currently therapy consists of Zomig and trazodone at night. She also takes Zofran as necessary. She does not feel that these regimens have helped her much. Patient states that she experiences a migraine headache at least 2 times every 3 months lasting approximately 72 hours to one week each. Patient has been referred to the headache clinic.   Asthma: Patient states she suffered from asthma since 2004. She endorses Intermittent asthma attacks, occurring approximately 2 times a week. Patient states her current asthma triggers are allergies, stress, weight gain, rain and exercise. Patient's current therapy is Singulair, Flonase, Symbicort and albuterol rescue inhaler.  Review of Systems Negative, with the exception of above mentioned in HPI     Objective:   Physical Exam BP 120/82  Pulse 70  Temp(Src) 98.3 F (36.8 C) (Oral)  Ht 5\' 1"  (1.549 m)  Wt 172 lb 8 oz (78.245 kg)  BMI 32.61 kg/m2 Gen: Mildly agitated, mildly obese African American female. No acute distress and nontoxic in  appearance.

## 2014-03-15 ENCOUNTER — Telehealth: Payer: Self-pay | Admitting: Family Medicine

## 2014-03-15 NOTE — Telephone Encounter (Signed)
FMLA papers returned to [place]. Pt would like a copy for her records as well. Thanks

## 2014-03-15 NOTE — Telephone Encounter (Signed)
Left voice message for pt informing her that FMLA paperwork was faxed to [phone] and original copy is ready for pick up.  Paper work copied to be scan in pt's record. Derl Barrow, RN

## 2014-03-15 NOTE — Assessment & Plan Note (Signed)
FMLA papers completed today with patient.  Referral to headache clinic completed Started pt on topamax.

## 2014-04-15 ENCOUNTER — Other Ambulatory Visit: Payer: Self-pay | Admitting: Family Medicine

## 2014-07-28 ENCOUNTER — Encounter (HOSPITAL_COMMUNITY): Payer: Self-pay | Admitting: Emergency Medicine

## 2014-07-28 ENCOUNTER — Ambulatory Visit (INDEPENDENT_AMBULATORY_CARE_PROVIDER_SITE_OTHER): Payer: 59 | Admitting: Family Medicine

## 2014-07-28 ENCOUNTER — Emergency Department (HOSPITAL_COMMUNITY)
Admission: EM | Admit: 2014-07-28 | Discharge: 2014-07-28 | Disposition: A | Discharge status: Home / Self Care | Payer: 59 | Attending: Emergency Medicine | Admitting: Emergency Medicine

## 2014-07-28 ENCOUNTER — Encounter: Payer: Self-pay | Admitting: Family Medicine

## 2014-07-28 ENCOUNTER — Emergency Department (HOSPITAL_COMMUNITY): Payer: 59

## 2014-07-28 VITALS — BP 106/80 | HR 71 | Temp 98.7°F | Wt 178.3 lb

## 2014-07-28 DIAGNOSIS — R5383 Other fatigue: Secondary | ICD-10-CM | POA: Diagnosis not present

## 2014-07-28 DIAGNOSIS — R0781 Pleurodynia: Secondary | ICD-10-CM

## 2014-07-28 DIAGNOSIS — Z880 Allergy status to penicillin: Secondary | ICD-10-CM | POA: Insufficient documentation

## 2014-07-28 DIAGNOSIS — M79609 Pain in unspecified limb: Secondary | ICD-10-CM

## 2014-07-28 DIAGNOSIS — F3289 Other specified depressive episodes: Secondary | ICD-10-CM | POA: Diagnosis not present

## 2014-07-28 DIAGNOSIS — Z87891 Personal history of nicotine dependence: Secondary | ICD-10-CM | POA: Insufficient documentation

## 2014-07-28 DIAGNOSIS — F329 Major depressive disorder, single episode, unspecified: Secondary | ICD-10-CM | POA: Insufficient documentation

## 2014-07-28 DIAGNOSIS — M79662 Pain in left lower leg: Secondary | ICD-10-CM

## 2014-07-28 DIAGNOSIS — J45901 Unspecified asthma with (acute) exacerbation: Secondary | ICD-10-CM | POA: Insufficient documentation

## 2014-07-28 DIAGNOSIS — Z9981 Dependence on supplemental oxygen: Secondary | ICD-10-CM | POA: Insufficient documentation

## 2014-07-28 DIAGNOSIS — K219 Gastro-esophageal reflux disease without esophagitis: Secondary | ICD-10-CM | POA: Diagnosis not present

## 2014-07-28 DIAGNOSIS — G4730 Sleep apnea, unspecified: Secondary | ICD-10-CM | POA: Insufficient documentation

## 2014-07-28 DIAGNOSIS — Z79899 Other long term (current) drug therapy: Secondary | ICD-10-CM | POA: Insufficient documentation

## 2014-07-28 DIAGNOSIS — R0602 Shortness of breath: Secondary | ICD-10-CM | POA: Diagnosis present

## 2014-07-28 DIAGNOSIS — R079 Chest pain, unspecified: Secondary | ICD-10-CM | POA: Diagnosis not present

## 2014-07-28 DIAGNOSIS — F411 Generalized anxiety disorder: Secondary | ICD-10-CM | POA: Diagnosis not present

## 2014-07-28 DIAGNOSIS — R5381 Other malaise: Secondary | ICD-10-CM | POA: Diagnosis not present

## 2014-07-28 DIAGNOSIS — N189 Chronic kidney disease, unspecified: Secondary | ICD-10-CM | POA: Diagnosis not present

## 2014-07-28 DIAGNOSIS — IMO0002 Reserved for concepts with insufficient information to code with codable children: Secondary | ICD-10-CM | POA: Insufficient documentation

## 2014-07-28 DIAGNOSIS — Z8679 Personal history of other diseases of the circulatory system: Secondary | ICD-10-CM | POA: Diagnosis not present

## 2014-07-28 DIAGNOSIS — J069 Acute upper respiratory infection, unspecified: Secondary | ICD-10-CM | POA: Diagnosis not present

## 2014-07-28 DIAGNOSIS — M7989 Other specified soft tissue disorders: Secondary | ICD-10-CM

## 2014-07-28 DIAGNOSIS — R071 Chest pain on breathing: Secondary | ICD-10-CM

## 2014-07-28 LAB — I-STAT TROPONIN, ED: TROPONIN I, POC: 0.01 ng/mL (ref 0.00–0.08)

## 2014-07-28 LAB — CBC
HEMATOCRIT: 37.4 % (ref 36.0–46.0)
HEMOGLOBIN: 12.7 g/dL (ref 12.0–15.0)
MCH: 31.1 pg (ref 26.0–34.0)
MCHC: 34 g/dL (ref 30.0–36.0)
MCV: 91.7 fL (ref 78.0–100.0)
Platelets: 291 10*3/uL (ref 150–400)
RBC: 4.08 MIL/uL (ref 3.87–5.11)
RDW: 12.7 % (ref 11.5–15.5)
WBC: 4.5 10*3/uL (ref 4.0–10.5)

## 2014-07-28 LAB — PRO B NATRIURETIC PEPTIDE: Pro B Natriuretic peptide (BNP): 43 pg/mL (ref 0–125)

## 2014-07-28 LAB — BASIC METABOLIC PANEL
Anion gap: 13 (ref 5–15)
BUN: 12 mg/dL (ref 6–23)
CHLORIDE: 102 meq/L (ref 96–112)
CO2: 25 meq/L (ref 19–32)
CREATININE: 0.8 mg/dL (ref 0.50–1.10)
Calcium: 9.3 mg/dL (ref 8.4–10.5)
GFR calc Af Amer: >90 mL/min (ref >90)
GFR calc non Af Amer: 86 mL/min — ABNORMAL LOW (ref >90)
GLUCOSE: 85 mg/dL (ref 70–99)
POTASSIUM: 3.9 meq/L (ref 3.7–5.3)
Sodium: 140 mEq/L (ref 137–147)

## 2014-07-28 LAB — D-DIMER, QUANTITATIVE (NOT AT ARMC): D DIMER QUANT: 0.5 ug{FEU}/mL — AB (ref 0.00–0.48)

## 2014-07-28 MED ORDER — HYDROCODONE-ACETAMINOPHEN 5-325 MG PO TABS: 1.0000 | ORAL_TABLET | ORAL | Status: DC | PRN

## 2014-07-28 MED ORDER — SODIUM CHLORIDE 0.9 % IV BOLUS (SEPSIS)
1000.0000 mL | Freq: Once | INTRAVENOUS | Status: AC
  Administered 2014-07-28: 1000 mL via INTRAVENOUS

## 2014-07-28 MED ORDER — HYDROCODONE-ACETAMINOPHEN 5-325 MG PO TABS
1.0000 | ORAL_TABLET | Freq: Once | ORAL | Status: AC
Start: 2014-07-28 — End: 2014-07-28
  Administered 2014-07-28: 1 via ORAL
  Filled 2014-07-28: qty 1

## 2014-07-28 MED ORDER — IOHEXOL 350 MG/ML SOLN
80.0000 mL | Freq: Once | INTRAVENOUS | Status: AC | PRN
  Administered 2014-07-28: 47 mL via INTRAVENOUS

## 2014-07-28 NOTE — ED Notes (Signed)
Per pt sts chest pain and SOB for a few days. sts hurts to take a deep breath and she gets SOB on exertion. sts she has been taking breathing treatments and no helping.

## 2014-07-28 NOTE — Progress Notes (Signed)
    Subjective   [name] is a 48 y.o. female that presents for a same day visit  1. Cough: Cough has been present for about one week. She got her Flu shot last week also. She has associated chils and hot flashes. Her cough worsened about two days ago with associated pleuritic pain. She has been using albuterol about 5 times per day. She uses Symbicort, Flonase, Singular and Zyrtec daily. She uses a CPAP at night. No fevers or chills.   History  Substance Use Topics  . Smoking status: Former Smoker -- 0.25 packs/day for 15 years    Types: Cigarettes    Quit date: 02/16/2003  . Smokeless tobacco: Never Used  . Alcohol Use: Yes     Comment: socially    ROS Per HPI  Objective   BP 106/80  Pulse 71  Temp(Src) 98.7 F (37.1 C) (Oral)  Wt 178 lb 4.8 oz (80.876 kg)  General: Well appearing in no distress Respiratory/Chest: Clear to auscultation bilaterally without wheezing or rhonchi. No reproducible chest pain Cardiovascular: regular rate and rhythm without murmur Extremities: Left calf tender without swelling or erythema. Holman sign positive. 2+ pulses bilaterally Neuro: Grossly intact.  Assessment and Plan   Please refer to problem based charting of assessment and plan

## 2014-07-28 NOTE — ED Provider Notes (Signed)
CSN: [id number]     Arrival date & time 07/28/14  1738 History   First MD Initiated Contact with Patient 07/28/14 1801     Chief Complaint  Patient presents with  . Chest Pain  . Shortness of Breath     (Consider location/radiation/quality/duration/timing/severity/associated sxs/prior Treatment) HPI 48 year old female presents with fatigue, inspiratory chest pain, and shortness of breath over the past one week. Occasionally with breathing she noticed her pain and feels like it went to her back. She's had chronic left lower extremity swelling that does not seem to be worse currently. She had previous DVT ultrasound was negative. She feels more short of breath with exertion than typical. She's on is lower later as was a she's been trying her albuterol without relief. She's been feeling much more fatigued than usual and sleeping more. Has not noticed a fevers but occasionally has had chills and sweats. Has had a productive cough that seems to be more productive and more often than her typical asthma cough. Seen by her PCP and had an elevated d-dimer was sent to the ED for further evaluation.  Past Medical History  Diagnosis Date  . Migraines   . Asthma   . Anemia   . Migraine   . Anxiety   . Depression   . Termination of pregnancy     x 2  . PONV (postoperative nausea and vomiting)   . Dysrhythmia     Irregular heat w/ asthma episodes and anxiety  . Sleep apnea     uses CPAP  . GERD (gastroesophageal reflux disease)   . Chronic kidney disease     frequent urination- tx w/ vesicare   Past Surgical History  Procedure Laterality Date  . Ablation    . Cesarean section      x3  . Mab      x 4  . Dilation and curettage of uterus  05/31/2005    MAB   . Tubal ligation  11/01/2006  . Leep    . Hernia repair  1990's  . Wisdom tooth extraction    . Mouth surgery      teeth extraction  . Hysteroscopy w/ endometrial ablation     Family History  Problem Relation Age of Onset  .  Diabetes Maternal Grandmother   . Anesthesia problems Neg Hx    History  Substance Use Topics  . Smoking status: Former Smoker -- 0.25 packs/day for 15 years    Types: Cigarettes    Quit date: 02/16/2003  . Smokeless tobacco: Never Used  . Alcohol Use: Yes     Comment: socially   OB History   Grav Para Term Preterm Abortions TAB SAB Ect Mult Living   8 4 4  0 4     4     Review of Systems  Constitutional: Positive for fatigue. Negative for fever.  HENT: Positive for congestion.   Respiratory: Positive for cough and shortness of breath.   Cardiovascular: Positive for chest pain.  Gastrointestinal: Negative for vomiting and abdominal pain.  Neurological: Positive for weakness.  All other systems reviewed and are negative.     Allergies  Amitriptyline; Fluticasone-salmeterol; Penicillins; Ciprofloxacin; Macrobid; Nitrofurantoin; and Sulfamethoxazole-trimethoprim  Home Medications   Prior to Admission medications   Medication Sig Start Date End Date Taking? Authorizing Provider  albuterol (PROVENTIL) (2.5 MG/3ML) 0.083% nebulizer solution Take 3 mLs (2.5 mg total) by nebulization every 4 (four) hours as needed. 10/31/12   [place], DO  benzonatate ([place])  200 MG capsule Take 1 capsule (200 mg total) by mouth 2 (two) times daily as needed for cough. 11/25/13   Renee A Kuneff, DO  BIOTIN PO Take 2,000 mg by mouth daily.    Historical Provider, MD  busPIRone (BUSPAR) 15 MG tablet TAKE 1 TABLET BY MOUTH 2 TIMES DAILY. 09/04/13   Renee A Kuneff, DO  cetirizine (ZYRTEC) 10 MG tablet Take 1 tablet (10 mg total) by mouth daily. 11/25/13   Renee A Kuneff, DO  chlorproMAZINE (THORAZINE) 25 MG tablet TAKE 1 TABLET BY MOUTH EVERY 6 HOURS AS NEEDED FOR NAUSEA/MIGRAINE 04/15/14   Renee A Kuneff, DO  Cholecalciferol (VITAMIN D3) 10000 UNITS capsule Take 5 capsules (50,000 Units total) by mouth every 7 (seven) days. 08/05/13   Renee A Kuneff, DO  DULoxetine (CYMBALTA) 20 MG capsule Take 1  capsule (20 mg total) by mouth daily. 03/10/13   Amber Fidel Levy, MD  fluticasone (FLONASE) 50 MCG/ACT nasal spray PLACE 2 SPRAYS INTO THE NOSE DAILY. 04/15/14   Renee A Kuneff, DO  ibuprofen (ADVIL,MOTRIN) 600 MG tablet Take 1 tablet (600 mg total) by mouth every 6 (six) hours as needed for pain. For pain 12/18/11   Osborne Oman, MD  montelukast (SINGULAIR) 10 MG tablet TAKE 1 TABLET BY MOUTH AT BEDTIME. 04/15/14   Renee A Kuneff, DO  ondansetron (ZOFRAN ODT) 4 MG disintegrating tablet Take 1 tablet (4 mg total) by mouth every 6 (six) hours as needed. For nausea 03/29/11   Alveda Reasons, MD  oxybutynin (DITROPAN-XL) 10 MG 24 hr tablet Take 10 mg by mouth as needed. 06/30/13   Leone Haven, MD  pantoprazole (PROTONIX) 20 MG tablet Take 1 tablet (20 mg total) by mouth daily. 03/10/13   Amber Fidel Levy, MD  pantoprazole (PROTONIX) 40 MG tablet TAKE 1 TABLET BY MOUTH DAILY 04/15/14   Renee A Kuneff, DO  phenazopyridine (PYRIDIUM) 100 MG tablet Take 1 tablet (100 mg total) by mouth 3 (three) times daily as needed for pain. 02/23/14   Renee A Kuneff, DO  polyethylene glycol powder (GLYCOLAX/MIRALAX) powder Take 17 g by mouth 2 (two) times daily as needed. 11/25/13   Renee A Kuneff, DO  propranolol (INDERAL) 80 MG tablet Take 80 mg by mouth as needed.    Historical Provider, MD  Pyridoxine HCl (VITAMIN B-6 PO) Take 1 tablet by mouth daily. Unknown OTC strength    Historical Provider, MD  Spacer/Aero-Holding Chambers (AEROCHAMBER PLUS) inhaler 1 each by Other route once. Please provide patient with 1 spacer for use with her inhalers Use as instructed     Historical Provider, MD  SYMBICORT 160-4.5 MCG/ACT inhaler INHALE 2 PUFFS INTO THE LUNGS 2 TIMES DAILY WITH SPACER FOR ASTHMA CONTROL 04/15/14   Renee A Kuneff, DO  traZODone (DESYREL) 50 MG tablet TAKE 1 TABLET BY MOUTH AT BEDTIME. 04/15/14   Renee A Kuneff, DO  VENTOLIN HFA 108 (90 BASE) MCG/ACT inhaler INHALE 2 PUFFS INTO THE LUNGS EVERY 4 HOURS AS  NEEDED FOR WHEEZING OR SHORTNESS OF BREATH 04/15/14   Renee A Kuneff, DO  verapamil (CALAN) 40 MG tablet Take 40 mg by mouth as needed. When gets palpitations    Historical Provider, MD  zolmitriptan (ZOMIG) 5 MG tablet Take 1 tablet (5 mg total) by mouth as needed. For migraine 07/11/12   [place], DO   BP 156/93  Pulse 63  Temp(Src) 98.7 F (37.1 C)  Resp 20  SpO2 100% Physical Exam  Nursing  note and vitals reviewed. Constitutional: She is oriented to person, place, and time. She appears well-developed and well-nourished. No distress.  HENT:  Head: Normocephalic and atraumatic.  Right Ear: External ear normal.  Left Ear: External ear normal.  Nose: Nose normal.  Eyes: Right eye exhibits no discharge. Left eye exhibits no discharge.  Cardiovascular: Normal rate, regular rhythm and normal heart sounds.   Pulmonary/Chest: Effort normal and breath sounds normal. She has no wheezes. She has no rales. She exhibits no tenderness.  Abdominal: Soft. She exhibits no distension. There is no tenderness.  Musculoskeletal:  Mild calf swelling on the left greater than right  Neurological: She is alert and oriented to person, place, and time.  Skin: Skin is warm and dry.    ED Course  Procedures (including critical care time) Labs Review Labs Reviewed  BASIC METABOLIC PANEL - Abnormal; Notable for the following:    GFR calc non Af Amer 86 (*)    All other components within normal limits  CBC  PRO B NATRIURETIC PEPTIDE  I-STAT TROPOININ, ED    Imaging Review Dg Chest 2 View  07/28/2014   CLINICAL DATA:  Chest pain and shortness of Breath  EXAM: CHEST  2 VIEW  COMPARISON:  07/17/2013  FINDINGS: Mild cardiac enlargement. The mediastinal contours are within normal limits. Both lungs are clear. The visualized skeletal structures are unremarkable.  IMPRESSION: 1. No acute cardiopulmonary abnormalities.   Electronically Signed   By: Kerby Moors M.D.   On: 07/28/2014 20:00   Ct Angio  Chest Pe W/cm &/or Wo Cm  07/28/2014   CLINICAL DATA:  Pleuritic chest pain and elevated D-dimer.  EXAM: CT ANGIOGRAPHY CHEST WITH CONTRAST  TECHNIQUE: Multidetector CT imaging of the chest was performed using the standard protocol during bolus administration of intravenous contrast. Multiplanar CT image reconstructions and MIPs were obtained to evaluate the vascular anatomy.  CONTRAST:  86mL OMNIPAQUE IOHEXOL 350 MG/ML SOLN  COMPARISON:  Chest x-ray 07/28/2014  FINDINGS: The chest wall is unremarkable. No obvious breast masses, supraclavicular or axillary adenopathy. The bony thorax is intact. No destructive bone lesions or spinal canal compromise. Mild thoracic scoliosis.  The heart is borderline enlarged. No pericardial effusion. No mediastinal or hilar mass or adenopathy. Small scattered lymph nodes are noted. The aorta is normal in caliber. No dissection. The branch vessels are patent. The esophagus is grossly normal.  The pulmonary arterial tree is well opacified. No filling defects to suggest pulmonary embolism.  The lungs are clear. No pleural effusion or pulmonary nodules. No bronchiectasis or interstitial lung disease.  The upper abdomen demonstrates numerous benign appearing hepatic cysts.  Review of the MIP images confirms the above findings.  IMPRESSION: No CT findings for pulmonary embolism.  Normal thoracic aorta.  No acute pulmonary findings.   Electronically Signed   By: Kalman Jewels M.D.   On: 07/28/2014 20:21     EKG Interpretation None      Date: 07/28/2014  Rate: 61  Rhythm: normal sinus rhythm  QRS Axis: normal  Intervals: normal  ST/T Wave abnormalities: normal  Conduction Disutrbances:none  Narrative Interpretation:   Old EKG Reviewed: unchanged   Editor: Chapman Fitch (Cardiovascular Sonographer)      *PRELIMINARY RESULTS*  Vascular Ultrasound  Left lower extremity venous duplex has been completed. Preliminary findings: no evidence of DVT.  Landry Mellow, RDMS, RVT   07/28/2014, 6:44 PM    MDM   Final diagnoses:  Upper respiratory infection  Other fatigue  Given patient's congestion and cough her fatigue is likely related to an upper or infection. This is likely where her pleuritic chest pain is coming from as she does not have any evidence of a pulmonary embolism or pneumonia. She's not hypoxic or in any distress. She speaks in complete sentences. She's not appear ill at this time. She feels somewhat improved with fluids but is requesting some pain medicine at home. I have very low suspicion this is ACS with a normal EKG and symptoms going on for almost a week. Her fatigue is most likely related to an infectious source. There is no signs of bacterial illness that would require antibiotics. At this time I discussed return precautions and she'll follow up with her PCP.    Ephraim Hamburger, MD 07/29/14 [phone]

## 2014-07-28 NOTE — Assessment & Plan Note (Signed)
Patient with signs concerning for PE. [place] PE score of 3. No wheezing. Good air movement. No reproducible chest pain. Store not consistent with heart etiology. HEART score not calculable since no EKG obtained in clinic. Symptoms could be from consistent cough vs anxiety as well. Will order D-dimer and have patient transported to ED for further workup.

## 2014-07-28 NOTE — Discharge Instructions (Signed)
Cough, Adult  A cough is a reflex that helps clear your throat and airways. It can help heal the body or may be a reaction to an irritated airway. A cough may only last 2 or 3 weeks (acute) or may last more than 8 weeks (chronic).  CAUSES Acute cough:  Viral or bacterial infections. Chronic cough:  Infections.  Allergies.  Asthma.  Post-nasal drip.  Smoking.  Heartburn or acid reflux.  Some medicines.  Chronic lung problems (COPD).  Cancer. SYMPTOMS   Cough.  Fever.  Chest pain.  Increased breathing rate.  High-pitched whistling sound when breathing (wheezing).  Colored mucus that you cough up (sputum). TREATMENT   A bacterial cough may be treated with antibiotic medicine.  A viral cough must run its course and will not respond to antibiotics.  Your caregiver may recommend other treatments if you have a chronic cough. HOME CARE INSTRUCTIONS   Only take over-the-counter or prescription medicines for pain, discomfort, or fever as directed by your caregiver. Use cough suppressants only as directed by your caregiver.  Use a cold steam vaporizer or humidifier in your bedroom or home to help loosen secretions.  Sleep in a semi-upright position if your cough is worse at night.  Rest as needed.  Stop smoking if you smoke. SEEK IMMEDIATE MEDICAL CARE IF:   You have pus in your sputum.  Your cough starts to worsen.  You cannot control your cough with suppressants and are losing sleep.  You begin coughing up blood.  You have difficulty breathing.  You develop pain which is getting worse or is uncontrolled with medicine.  You have a fever. MAKE SURE YOU:   Understand these instructions.  Will watch your condition.  Will get help right away if you are not doing well or get worse. Document Released: 04/13/2011 Document Revised: 01/07/2012 Document Reviewed: 04/13/2011 [facility] Patient Information 2015 [place], [place]. This information is not intended  to replace advice given to you by your health care provider. Make sure you discuss any questions you have with your health care provider.   Upper Respiratory Infection, Adult An upper respiratory infection (URI) is also sometimes known as the common cold. The upper respiratory tract includes the nose, sinuses, throat, trachea, and bronchi. Bronchi are the airways leading to the lungs. Most people improve within 1 week, but symptoms can last up to 2 weeks. A residual cough may last even longer.  CAUSES Many different viruses can infect the tissues lining the upper respiratory tract. The tissues become irritated and inflamed and often become very moist. Mucus production is also common. A cold is contagious. You can easily spread the virus to others by oral contact. This includes kissing, sharing a glass, coughing, or sneezing. Touching your mouth or nose and then touching a surface, which is then touched by another person, can also spread the virus. SYMPTOMS  Symptoms typically develop 1 to 3 days after you come in contact with a cold virus. Symptoms vary from person to person. They may include:  Runny nose.  Sneezing.  Nasal congestion.  Sinus irritation.  Sore throat.  Loss of voice (laryngitis).  Cough.  Fatigue.  Muscle aches.  Loss of appetite.  Headache.  Low-grade fever. DIAGNOSIS  You might diagnose your own cold based on familiar symptoms, since most people get a cold 2 to 3 times a year. Your caregiver can confirm this based on your exam. Most importantly, your caregiver can check that your symptoms are not due to another  disease such as strep throat, sinusitis, pneumonia, asthma, or epiglottitis. Blood tests, throat tests, and X-rays are not necessary to diagnose a common cold, but they may sometimes be helpful in excluding other more serious diseases. Your caregiver will decide if any further tests are required. RISKS AND COMPLICATIONS  You may be at risk for a more  severe case of the common cold if you smoke cigarettes, have chronic heart disease (such as heart failure) or lung disease (such as asthma), or if you have a weakened immune system. The very young and very old are also at risk for more serious infections. Bacterial sinusitis, middle ear infections, and bacterial pneumonia can complicate the common cold. The common cold can worsen asthma and chronic obstructive pulmonary disease (COPD). Sometimes, these complications can require emergency medical care and may be life-threatening. PREVENTION  The best way to protect against getting a cold is to practice good hygiene. Avoid oral or hand contact with people with cold symptoms. Wash your hands often if contact occurs. There is no clear evidence that vitamin C, vitamin E, echinacea, or exercise reduces the chance of developing a cold. However, it is always recommended to get plenty of rest and practice good nutrition. TREATMENT  Treatment is directed at relieving symptoms. There is no cure. Antibiotics are not effective, because the infection is caused by a virus, not by bacteria. Treatment may include:  Increased fluid intake. Sports drinks offer valuable electrolytes, sugars, and fluids.  Breathing heated mist or steam (vaporizer or shower).  Eating chicken soup or other clear broths, and maintaining good nutrition.  Getting plenty of rest.  Using gargles or lozenges for comfort.  Controlling fevers with ibuprofen or acetaminophen as directed by your caregiver.  Increasing usage of your inhaler if you have asthma. Zinc gel and zinc lozenges, taken in the first 24 hours of the common cold, can shorten the duration and lessen the severity of symptoms. Pain medicines may help with fever, muscle aches, and throat pain. A variety of non-prescription medicines are available to treat congestion and runny nose. Your caregiver can make recommendations and may suggest nasal or lung inhalers for other symptoms.    HOME CARE INSTRUCTIONS   Only take over-the-counter or prescription medicines for pain, discomfort, or fever as directed by your caregiver.  Use a warm mist humidifier or inhale steam from a shower to increase air moisture. This may keep secretions moist and make it easier to breathe.  Drink enough water and fluids to keep your urine clear or pale yellow.  Rest as needed.  Return to work when your temperature has returned to normal or as your caregiver advises. You may need to stay home longer to avoid infecting others. You can also use a face mask and careful hand washing to prevent spread of the virus. SEEK MEDICAL CARE IF:   After the first few days, you feel you are getting worse rather than better.  You need your caregiver's advice about medicines to control symptoms.  You develop chills, worsening shortness of breath, or brown or red sputum. These may be signs of pneumonia.  You develop yellow or brown nasal discharge or pain in the face, especially when you bend forward. These may be signs of sinusitis.  You develop a fever, swollen neck glands, pain with swallowing, or white areas in the back of your throat. These may be signs of strep throat. SEEK IMMEDIATE MEDICAL CARE IF:   You have a fever.  You develop severe or  persistent headache, ear pain, sinus pain, or chest pain.  You develop wheezing, a prolonged cough, cough up blood, or have a change in your usual mucus (if you have chronic lung disease).  You develop sore muscles or a stiff neck. Document Released: 04/10/2001 Document Revised: 01/07/2012 Document Reviewed: 01/20/2014 [facility] Patient Information 2015 [place], [place]. This information is not intended to replace advice given to you by your health care provider. Make sure you discuss any questions you have with your health care provider.    Fatigue Fatigue is a feeling of tiredness, lack of energy, lack of motivation, or feeling tired all the time. Having  enough rest, good nutrition, and reducing stress will normally reduce fatigue. Consult your caregiver if it persists. The nature of your fatigue will help your caregiver to find out its cause. The treatment is based on the cause.  CAUSES  There are many causes for fatigue. Most of the time, fatigue can be traced to one or more of your habits or routines. Most causes fit into one or more of three general areas. They are: Lifestyle problems  Sleep disturbances.  Overwork.  Physical exertion.  Unhealthy habits.  Poor eating habits or eating disorders.  Alcohol and/or drug use .  Lack of proper nutrition (malnutrition). Psychological problems  Stress and/or anxiety problems.  Depression.  Grief.  Boredom. Medical Problems or Conditions  Anemia.  Pregnancy.  Thyroid gland problems.  Recovery from major surgery.  Continuous pain.  Emphysema or asthma that is not well controlled  Allergic conditions.  Diabetes.  Infections (such as mononucleosis).  Obesity.  Sleep disorders, such as sleep apnea.  Heart failure or other heart-related problems.  Cancer.  Kidney disease.  Liver disease.  Effects of certain medicines such as antihistamines, cough and cold remedies, prescription pain medicines, heart and blood pressure medicines, drugs used for treatment of cancer, and some antidepressants. SYMPTOMS  The symptoms of fatigue include:   Lack of energy.  Lack of drive (motivation).  Drowsiness.  Feeling of indifference to the surroundings. DIAGNOSIS  The details of how you feel help guide your caregiver in finding out what is causing the fatigue. You will be asked about your present and past health condition. It is important to review all medicines that you take, including prescription and non-prescription items. A thorough exam will be done. You will be questioned about your feelings, habits, and normal lifestyle. Your caregiver may suggest blood tests, urine  tests, or other tests to look for common medical causes of fatigue.  TREATMENT  Fatigue is treated by correcting the underlying cause. For example, if you have continuous pain or depression, treating these causes will improve how you feel. Similarly, adjusting the dose of certain medicines will help in reducing fatigue.  HOME CARE INSTRUCTIONS   Try to get the required amount of good sleep every night.  Eat a healthy and nutritious diet, and drink enough water throughout the day.  Practice ways of relaxing (including yoga or meditation).  Exercise regularly.  Make plans to change situations that cause stress. Act on those plans so that stresses decrease over time. Keep your work and personal routine reasonable.  Avoid street drugs and minimize use of alcohol.  Start taking a daily multivitamin after consulting your caregiver. SEEK MEDICAL CARE IF:   You have persistent tiredness, which cannot be accounted for.  You have fever.  You have unintentional weight loss.  You have headaches.  You have disturbed sleep throughout the night.  You  are feeling sad.  You have constipation.  You have dry skin.  You have gained weight.  You are taking any new or different medicines that you suspect are causing fatigue.  You are unable to sleep at night.  You develop any unusual swelling of your legs or other parts of your body. SEEK IMMEDIATE MEDICAL CARE IF:   You are feeling confused.  Your vision is blurred.  You feel faint or pass out.  You develop severe headache.  You develop severe abdominal, pelvic, or back pain.  You develop chest pain, shortness of breath, or an irregular or fast heartbeat.  You are unable to pass a normal amount of urine.  You develop abnormal bleeding such as bleeding from the rectum or you vomit blood.  You have thoughts about harming yourself or committing suicide.  You are worried that you might harm someone else. MAKE SURE YOU:    Understand these instructions.  Will watch your condition.  Will get help right away if you are not doing well or get worse. Document Released: 08/12/2007 Document Revised: 01/07/2012 Document Reviewed: 02/16/2014 [facility] Patient Information 2015 [place], [place]. This information is not intended to replace advice given to you by your health care provider. Make sure you discuss any questions you have with your health care provider.

## 2014-07-28 NOTE — ED Notes (Signed)
Pt states that she does not want anything else for pain.

## 2014-07-28 NOTE — Progress Notes (Signed)
*  PRELIMINARY RESULTS* Vascular Ultrasound Left lower extremity venous duplex has been completed.  Preliminary findings: no evidence of DVT.   Landry Mellow, RDMS, RVT  07/28/2014, 6:44 PM

## 2014-08-10 ENCOUNTER — Ambulatory Visit (INDEPENDENT_AMBULATORY_CARE_PROVIDER_SITE_OTHER): Payer: 59 | Admitting: Family Medicine

## 2014-08-10 ENCOUNTER — Encounter: Payer: Self-pay | Admitting: Family Medicine

## 2014-08-10 VITALS — Temp 98.3°F | Ht 61.0 in | Wt 181.1 lb

## 2014-08-10 DIAGNOSIS — J4550 Severe persistent asthma, uncomplicated: Secondary | ICD-10-CM

## 2014-08-10 DIAGNOSIS — R300 Dysuria: Secondary | ICD-10-CM

## 2014-08-10 LAB — POCT URINALYSIS DIPSTICK
BILIRUBIN UA: NEGATIVE
Blood, UA: NEGATIVE
GLUCOSE UA: NEGATIVE
Ketones, UA: NEGATIVE
LEUKOCYTES UA: NEGATIVE
NITRITE UA: NEGATIVE
Protein, UA: 0.2
Spec Grav, UA: 1.015
UROBILINOGEN UA: 0.2
pH, UA: 6.5

## 2014-08-10 MED ORDER — CEPHALEXIN 500 MG PO CAPS: 500.0000 mg | ORAL_CAPSULE | Freq: Three times a day (TID) | ORAL | Status: DC

## 2014-08-10 MED ORDER — VENTOLIN HFA 108 (90 BASE) MCG/ACT IN AERS: 2.0000 | INHALATION_SPRAY | RESPIRATORY_TRACT | Status: DC | PRN

## 2014-08-10 MED ORDER — DULOXETINE HCL 20 MG PO CPEP
20.0000 mg | ORAL_CAPSULE | Freq: Every day | ORAL | Status: DC
Start: 2014-08-10 — End: 2014-10-14

## 2014-08-10 MED ORDER — CETIRIZINE HCL 10 MG PO TABS: 10.0000 mg | ORAL_TABLET | Freq: Every day | ORAL | Status: DC

## 2014-08-10 MED ORDER — MONTELUKAST SODIUM 10 MG PO TABS: 10.0000 mg | ORAL_TABLET | Freq: Every day | ORAL | Status: DC

## 2014-08-10 MED ORDER — SYMBICORT 160-4.5 MCG/ACT IN AERO: 2.0000 | INHALATION_SPRAY | Freq: Two times a day (BID) | RESPIRATORY_TRACT | Status: DC

## 2014-08-10 MED ORDER — ALBUTEROL SULFATE (2.5 MG/3ML) 0.083% IN NEBU
2.5000 mg | INHALATION_SOLUTION | RESPIRATORY_TRACT | Status: DC | PRN
Start: 2014-08-10 — End: 2014-12-31

## 2014-08-10 NOTE — Progress Notes (Signed)
   Subjective:    Patient ID: [name], female    DOB: 09/21/1966, 48 y.o.   MRN: [id number]  HPI: Pt presents to clinic for ED for chest pain. She was seen on 9/30 and was sent to the ED for eval for PE, but she was diagnosed with a URI (she had no evidence for PE or DVT). She has not had any more chest pain and her breathing is at her baseline (she sleeps with CPAP and uses multiple medications for asthma). She requests refill of her asthma and "breathing-related" medications. She states anxiety about everything plays a role, as well as "over-exerting" herself, but when she calms down and tries to relax, she feels better. Her asthma-related symptoms are worse with changes in the weather. She has some thick phlegm with cough, which she "always has," but it has been improved. She has had no fevers.  She does state she is concerned for a UTI. She has some burning when she urinate, with increased urge and frequency. These symptoms have been present for a couple of weeks. She has had no vaginal discharge. Her boyfriend was diagnosed with a UTI and they had sex afterward, but she has no vaginal discharge, itching, or bladder / pelvic pain.  Requests refills on: albuterol (inhaler and nebulizer), Zyrtec, Singulair, Symbicort, and Cymbalta.  Review of Systems: As above.     Objective:   Physical Exam Temp(Src) 98.3 F (36.8 C) (Oral)  Ht 5\' 1"  (1.549 m)  Wt 181 lb 1.6 oz (82.146 kg)  BMI 34.24 kg/m2 Gen: well-appearing adult female in NAD HEENT: [place]/AT, EOMI, PERRLA, TM's clear  Posterior oropharynx and nasal mucosae mildly inflamed Cardio: RRR, no murmur appreciated, no chest wall tenderness Pulm: CTAB, no wheezes, normal WOB Abd: soft, nontender, BS+; no suprapubic tenderness Ext: warm, well-perfused, no LE edema  UA: all normal     Assessment & Plan:  Possible URI as trigger for asthma, also in need of Rx refills.  - Refilled albuterol (neb and inhaler), Symbicort, Zyrtec,  Singulair. - strongly recommended f/u with PCP prior to running out of refills, in the future - reviewed red flags that would prompt return to clinic or re-presentation to the ED  Also possible component of anxiety  - refilled Cymbalta, but will need specific f/u for this. - unclear if pt is taking Buspar, as well (Cymbalta last Rx'd months ago, for only 1 month supply)  Symptoms suggestive of UTI, but UA normal. - will culture urine and start Keflex empirically - if culture negative, will alert pt and have her stop abx early - f/u PRN, otherwise  Emmaline Kluver, MD PGY-3, [facility] 08/10/2014, 8:12 PM

## 2014-08-10 NOTE — Patient Instructions (Signed)
Thank you for coming in, today!  I refilled your medicines for your breathing (albuterol nebulizer and inhaler, Singulair, Symbicort, Zyrtec) and your Cymbalta for anxiety. I will culture your urine and start you on Keflex -- take 500 mg three times per day. If your culture is negative, I will call you and tell you to stop the medicine early.  Come back to see Dr. Raoul Pitch as you need. Please feel free to call with any questions or concerns at any time, at [phone]. --Dr. Venetia Maxon

## 2014-08-11 LAB — URINE CULTURE
Colony Count: NO GROWTH
ORGANISM ID, BACTERIA: NO GROWTH

## 2014-08-12 ENCOUNTER — Telehealth: Payer: Self-pay | Admitting: Family Medicine

## 2014-08-12 NOTE — Telephone Encounter (Signed)
LMOVM of pt again [employer], Salome Spotted

## 2014-08-12 NOTE — Telephone Encounter (Signed)
White Team: Please call pt to let her know that her urine culture was negative. She can stop her antibiotics; if she feels they are helping, she may complete the course, but there is no evidence for infection. She should follow up with Dr. Raoul Pitch as needed. Thanks! --CMS

## 2014-08-12 NOTE — Telephone Encounter (Signed)
LVM for patient to call back to inform her of below

## 2014-08-30 ENCOUNTER — Encounter: Payer: Self-pay | Admitting: Family Medicine

## 2014-10-01 ENCOUNTER — Telehealth: Payer: Self-pay | Admitting: *Deleted

## 2014-10-01 ENCOUNTER — Ambulatory Visit (INDEPENDENT_AMBULATORY_CARE_PROVIDER_SITE_OTHER): Payer: 59 | Admitting: Family Medicine

## 2014-10-01 ENCOUNTER — Encounter: Payer: Self-pay | Admitting: Family Medicine

## 2014-10-01 VITALS — BP 139/75 | HR 80 | Temp 98.2°F | Ht 61.0 in | Wt 182.7 lb

## 2014-10-01 DIAGNOSIS — J0100 Acute maxillary sinusitis, unspecified: Secondary | ICD-10-CM

## 2014-10-01 MED ORDER — DEXLANSOPRAZOLE 30 MG PO CPDR: 30.0000 mg | DELAYED_RELEASE_CAPSULE | Freq: Every day | ORAL | Status: DC

## 2014-10-01 MED ORDER — ESOMEPRAZOLE MAGNESIUM 40 MG PO PACK: 40.0000 mg | PACK | Freq: Every day | ORAL | Status: DC

## 2014-10-01 MED ORDER — DOXYCYCLINE MONOHYDRATE 100 MG PO CAPS: 100.0000 mg | ORAL_CAPSULE | Freq: Two times a day (BID) | ORAL | Status: DC

## 2014-10-01 MED ORDER — GUAIFENESIN-CODEINE 100-10 MG/5ML PO SOLN: 5.0000 mL | Freq: Four times a day (QID) | ORAL | Status: DC | PRN

## 2014-10-01 NOTE — Telephone Encounter (Signed)
Sent in.  Thanks [employer]. Awanda Mink, DO of [facility] 10/01/2014, 1:26 PM

## 2014-10-01 NOTE — Patient Instructions (Signed)
Mucinex 600 mg ES twice per day. Robitussin AC at night for cough Doxycycline 100 mg two times per day for 10 days.  Please follow up with your PCP in 2 weeks.  Thanks, Dr. Awanda Mink

## 2014-10-01 NOTE — Telephone Encounter (Signed)
Pt aware of new Rx for Nexium [facility].  Derl Barrow, RN

## 2014-10-01 NOTE — Progress Notes (Signed)
  Subjective:     [name] is a 48 y.o. female who presents for evaluation of sinus pain. Symptoms include: congestion, facial pain, headaches, nasal congestion, post nasal drip and sore throat. Onset of symptoms was 10 days ago. Symptoms have been gradually worsening since that time. Past history is significant for asthma. Patient is a non-smoker.  Denies fever, chills, sweats, LAD.   The following portions of the patient's history were reviewed and updated as appropriate: allergies, current medications, past family history, past medical history, past social history, past surgical history and problem list.  Review of Systems Pertinent items are noted in HPI.   Objective:    BP 139/75 mmHg  Pulse 80  Temp(Src) 98.2 F (36.8 C) (Oral)  Ht 5\' 1"  (1.549 m)  Wt 182 lb 11.2 oz (82.872 kg)  BMI 34.54 kg/m2 General appearance: alert, cooperative and appears stated age Head: Normocephalic, without obvious abnormality, atraumatic Eyes: conjunctivae/corneas clear. PERRL, EOM's intact. Fundi benign. Ears: normal TM's and external ear canals both ears Nose: turbinates red, edematous Throat: lips, mucosa, and tongue normal; teeth and gums normal Neck: no adenopathy Lungs: clear to auscultation bilaterally    Assessment:    Acute bacterial sinusitis.    Plan:    Nasal saline sprays. Nasal steroids per medication orders. Antihistamines per medication orders. Augmentin per medication orders. Follow up in a few weeks or as needed.

## 2014-10-01 NOTE — Telephone Encounter (Signed)
Pt called stating that the Rx Dexlansoprazole will cost her $75/ month.  That is to costly for the pt.  Pt requested to restart Nexium.  Please advise.  Derl Barrow, RN

## 2014-10-14 ENCOUNTER — Ambulatory Visit (INDEPENDENT_AMBULATORY_CARE_PROVIDER_SITE_OTHER): Payer: 59 | Admitting: Family Medicine

## 2014-10-14 ENCOUNTER — Encounter: Payer: Self-pay | Admitting: Family Medicine

## 2014-10-14 VITALS — BP 138/83 | HR 79 | Temp 98.1°F | Ht 61.0 in | Wt 182.3 lb

## 2014-10-14 DIAGNOSIS — N951 Menopausal and female climacteric states: Secondary | ICD-10-CM

## 2014-10-14 DIAGNOSIS — R232 Flushing: Secondary | ICD-10-CM

## 2014-10-14 MED ORDER — CITALOPRAM HYDROBROMIDE 20 MG PO TABS: ORAL_TABLET | ORAL | Status: DC

## 2014-10-14 NOTE — Patient Instructions (Signed)
We will start citalopram 10 mg a day for 7 days, and day 8 take 20 mg a day until you see me in 4 weeks. I believe this is can help with your depression, anxiety and her hot flashes.

## 2014-10-14 NOTE — Progress Notes (Signed)
   Subjective:    Patient ID: [name], female    DOB: [birth year], 48 y.o.   MRN: [id number]  HPI  Hot flashes: Patient presents to family medicine clinic today for her hot flashes. States her hot flashes started in 2014, but has progressively gotten worse especially over the last 2-3 months. She has a monthly menses, that is approximately 1 day of light bleeding ever since her ablation earlier this year. She states she wakes up a couple times sometimes during the night with drenched clothing was wet, that she needs to change. She also has hot flashes at work, where she tries to go somewhere and stand in front of and to help core her down. Of note, she has been out of her prior SSRI, Cymbalta and BuSpar since approximately October.  Former smoker Past Medical History  Diagnosis Date  . Migraines   . Asthma   . Anemia   . Migraine   . Anxiety   . Depression   . Termination of pregnancy     x 2  . PONV (postoperative nausea and vomiting)   . Dysrhythmia     Irregular heat w/ asthma episodes and anxiety  . Sleep apnea     uses CPAP  . GERD (gastroesophageal reflux disease)   . Chronic kidney disease     frequent urination- tx w/ vesicare   Allergies  Allergen Reactions  . Amitriptyline     Mood swings  . Fluticasone-Salmeterol Other (See Comments)    Thrush even with mouth rinsing; worsens cough  . Penicillins   . Ciprofloxacin Itching and Rash  . Macrobid [Nitrofurantoin Monohyd Macro] Itching  . Nitrofurantoin Rash  . Sulfamethoxazole-Trimethoprim Rash   Review of Systems Per history of present illness    Objective:   Physical Exam BP 138/83 mmHg  Pulse 79  Temp(Src) 98.1 F (36.7 C) (Oral)  Ht 5\' 1"  (1.549 m)  Wt 182 lb 4.8 oz (82.691 kg)  BMI 34.46 kg/m2 Gen: African-American female, no acute distress, nontoxic in appearance, well-developed, well-nourished, mildly obese. Alert, oriented 3 HEENT: AT. [place].  Bilateral eyes without injections or icterus. MMM.    CV: RRR  Chest: CTAB, no wheeze or crackles  Psych: Mild flat affect, normal dress, normal speech, normal judgment, concentration and memory    Assessment & Plan:  [name] is a 48 y.o. female presents to family medicine clinic with worsening hot flashes. - Patient is still having monthly menses, but usually only lasting one day since her ablation. Her hot flashes have become progressively worse over the last few months. - We'll do a trial of citalopram 10 mg for 7 days then increase dose to 20 mg daily. Follow-up in 4 weeks to see how she's doing, hopefully the citalopram will help with her depression, anxiety and hot flashes.

## 2014-10-14 NOTE — Assessment & Plan Note (Signed)
-   Patient is still having monthly menses, but usually only lasting one day since her ablation. Her hot flashes have become progressively worse over the last few months. - We'll do a trial of citalopram 10 mg for 7 days then increase dose to 20 mg daily. Follow-up in 4 weeks to see how she's doing, hopefully the citalopram will help with her depression, anxiety and hot flashes.

## 2014-11-22 ENCOUNTER — Ambulatory Visit: Payer: Self-pay | Admitting: Family Medicine

## 2014-11-24 ENCOUNTER — Other Ambulatory Visit: Payer: Self-pay

## 2014-11-24 DIAGNOSIS — Z1231 Encounter for screening mammogram for malignant neoplasm of breast: Secondary | ICD-10-CM

## 2014-12-03 ENCOUNTER — Encounter (INDEPENDENT_AMBULATORY_CARE_PROVIDER_SITE_OTHER): Payer: Self-pay

## 2014-12-03 ENCOUNTER — Ambulatory Visit
Admission: RE | Admit: 2014-12-03 | Discharge: 2014-12-03 | Disposition: A | Discharge status: Home / Self Care | Payer: 59 | Source: Ambulatory Visit

## 2014-12-03 DIAGNOSIS — Z1231 Encounter for screening mammogram for malignant neoplasm of breast: Secondary | ICD-10-CM

## 2014-12-14 ENCOUNTER — Ambulatory Visit (INDEPENDENT_AMBULATORY_CARE_PROVIDER_SITE_OTHER): Payer: 59 | Admitting: Family Medicine

## 2014-12-14 ENCOUNTER — Encounter: Payer: Self-pay | Admitting: Family Medicine

## 2014-12-14 VITALS — BP 124/74 | HR 74 | Temp 98.2°F | Ht 61.0 in | Wt 185.1 lb

## 2014-12-14 DIAGNOSIS — F418 Other specified anxiety disorders: Secondary | ICD-10-CM

## 2014-12-14 DIAGNOSIS — N951 Menopausal and female climacteric states: Secondary | ICD-10-CM

## 2014-12-14 DIAGNOSIS — R232 Flushing: Secondary | ICD-10-CM

## 2014-12-14 MED ORDER — CITALOPRAM HYDROBROMIDE 40 MG PO TABS: ORAL_TABLET | ORAL | Status: DC

## 2014-12-14 NOTE — Patient Instructions (Signed)
Will increase her dose to 40 mg of citalopram daily I'll want to follow-up with you in approximately 6-8 weeks Again, I'm very proud a you Tonia Ghent

## 2014-12-16 NOTE — Assessment & Plan Note (Signed)
She reports little benefit with SSRI and hot flashes. She has an appt with gyn coming up. - Increase celexa to 40 mg daily (for depression/anxiety as well as might help with hot flashes) - Appt with gyn to discuss hot flashes and menstrual cycle after ablation.

## 2014-12-16 NOTE — Progress Notes (Signed)
   Subjective:    Patient ID: [name], female    DOB: [birth year], 49 y.o.   MRN: [id number]  HPI  Depression with anxiety: Pt states she does see an improvement with the start of celexa. She does not feel it is controled and would like to try the higher dose. She reports some changes in her life, with her significant other. She has decided that the relationship was not the best for her and is attempting to move on.  She states she is finally starting to make choices that are best for her. She is taking on a new position where her hours will be consistent and 5 days week. She has also decided to return to school.   Hot flashes: She reports continued hot flashes. She does not feel the added SSRI has helped. She has an appointment with her gynecologist in a few weeks to discuss her menstrual cycle post ablation and hot flashes.   Review of Systems Per HPI    Objective:   Physical Exam BP 124/74 mmHg  Pulse 74  Temp(Src) 98.2 F (36.8 C) (Oral)  Ht 5\' 1"  (1.549 m)  Wt 185 lb 1.6 oz (83.961 kg)  BMI 34.99 kg/m2 Gen: NAD. Well developed, well nourished. Nontoxic.  CV: RRR .  Psych: talkative today. Good spirits. Normal speech. Normal affect and mood.         Assessment & Plan:

## 2014-12-16 NOTE — Assessment & Plan Note (Signed)
Mild improvement on depression/anxiety on Celexa 20 mg, will increase to 40 mg daily. She looked really good today. Probably the best I have seen her with mood. - 4 week f/u

## 2014-12-29 ENCOUNTER — Emergency Department (HOSPITAL_COMMUNITY): Payer: 59

## 2014-12-29 ENCOUNTER — Emergency Department (HOSPITAL_COMMUNITY)
Admission: EM | Admit: 2014-12-29 | Discharge: 2014-12-29 | Disposition: A | Discharge status: Home / Self Care | Payer: 59 | Attending: Emergency Medicine | Admitting: Emergency Medicine

## 2014-12-29 ENCOUNTER — Encounter (HOSPITAL_COMMUNITY): Payer: Self-pay

## 2014-12-29 DIAGNOSIS — R05 Cough: Secondary | ICD-10-CM | POA: Diagnosis present

## 2014-12-29 DIAGNOSIS — K219 Gastro-esophageal reflux disease without esophagitis: Secondary | ICD-10-CM | POA: Insufficient documentation

## 2014-12-29 DIAGNOSIS — J45901 Unspecified asthma with (acute) exacerbation: Secondary | ICD-10-CM | POA: Insufficient documentation

## 2014-12-29 DIAGNOSIS — F329 Major depressive disorder, single episode, unspecified: Secondary | ICD-10-CM | POA: Insufficient documentation

## 2014-12-29 DIAGNOSIS — N189 Chronic kidney disease, unspecified: Secondary | ICD-10-CM | POA: Diagnosis not present

## 2014-12-29 DIAGNOSIS — Z862 Personal history of diseases of the blood and blood-forming organs and certain disorders involving the immune mechanism: Secondary | ICD-10-CM | POA: Diagnosis not present

## 2014-12-29 DIAGNOSIS — F419 Anxiety disorder, unspecified: Secondary | ICD-10-CM | POA: Diagnosis not present

## 2014-12-29 DIAGNOSIS — Z87891 Personal history of nicotine dependence: Secondary | ICD-10-CM | POA: Diagnosis not present

## 2014-12-29 DIAGNOSIS — Z9981 Dependence on supplemental oxygen: Secondary | ICD-10-CM | POA: Insufficient documentation

## 2014-12-29 DIAGNOSIS — I499 Cardiac arrhythmia, unspecified: Secondary | ICD-10-CM | POA: Diagnosis not present

## 2014-12-29 DIAGNOSIS — Z79899 Other long term (current) drug therapy: Secondary | ICD-10-CM | POA: Diagnosis not present

## 2014-12-29 DIAGNOSIS — Z7951 Long term (current) use of inhaled steroids: Secondary | ICD-10-CM | POA: Insufficient documentation

## 2014-12-29 DIAGNOSIS — Z880 Allergy status to penicillin: Secondary | ICD-10-CM | POA: Insufficient documentation

## 2014-12-29 DIAGNOSIS — G43909 Migraine, unspecified, not intractable, without status migrainosus: Secondary | ICD-10-CM | POA: Diagnosis not present

## 2014-12-29 DIAGNOSIS — G4730 Sleep apnea, unspecified: Secondary | ICD-10-CM | POA: Diagnosis not present

## 2014-12-29 DIAGNOSIS — J9801 Acute bronchospasm: Secondary | ICD-10-CM

## 2014-12-29 MED ORDER — IPRATROPIUM BROMIDE 0.02 % IN SOLN
0.5000 mg | Freq: Once | RESPIRATORY_TRACT | Status: AC
  Administered 2014-12-29: 0.5 mg via RESPIRATORY_TRACT
  Filled 2014-12-29: qty 2.5

## 2014-12-29 MED ORDER — BENZONATATE 100 MG PO CAPS
200.0000 mg | ORAL_CAPSULE | Freq: Once | ORAL | Status: AC
  Administered 2014-12-29: 200 mg via ORAL
  Filled 2014-12-29: qty 2

## 2014-12-29 MED ORDER — PREDNISONE 10 MG PO TABS: 20.0000 mg | ORAL_TABLET | Freq: Every day | ORAL | Status: DC

## 2014-12-29 MED ORDER — PREDNISONE 20 MG PO TABS
60.0000 mg | ORAL_TABLET | Freq: Once | ORAL | Status: AC
  Administered 2014-12-29: 60 mg via ORAL
  Filled 2014-12-29: qty 3

## 2014-12-29 MED ORDER — BENZONATATE 100 MG PO CAPS: 100.0000 mg | ORAL_CAPSULE | Freq: Three times a day (TID) | ORAL | Status: DC

## 2014-12-29 MED ORDER — ALBUTEROL (5 MG/ML) CONTINUOUS INHALATION SOLN
10.0000 mg/h | INHALATION_SOLUTION | Freq: Once | RESPIRATORY_TRACT | Status: AC
  Administered 2014-12-29: 10 mg/h via RESPIRATORY_TRACT
  Filled 2014-12-29: qty 20

## 2014-12-29 MED ORDER — IPRATROPIUM-ALBUTEROL 0.5-2.5 (3) MG/3ML IN SOLN
3.0000 mL | Freq: Once | RESPIRATORY_TRACT | Status: AC
  Administered 2014-12-29: 3 mL via RESPIRATORY_TRACT
  Filled 2014-12-29: qty 3

## 2014-12-29 NOTE — ED Notes (Signed)
Per pt, has had cough with shortness of breath x 1 week.  Pt has asthma.  Inhaler with no relief.  Cough giving pt a headache.  No fever.

## 2014-12-29 NOTE — ED Notes (Signed)
MD at bedside. At bedside

## 2014-12-29 NOTE — ED Provider Notes (Signed)
CSN: [id number]     Arrival date & time 12/29/14  7169 History   First MD Initiated Contact with Patient 12/29/14 1035     Chief Complaint  Patient presents with  . Cough  . Shortness of Breath     (Consider location/radiation/quality/duration/timing/severity/associated sxs/prior Treatment) HPI Comments: Patient here complaining of one-week history of asthma symptoms consisting of cough and shortness of breath. Denies any pleuritic chest pain. No fever noted. Has been using her inhaler with minimal relief. She also has a nebulizer at home which she has been using as well. Cough is caused her to have a mild headache. No syncope or near syncope. No anginal type chest pain. Symptoms are worse when she goes out into the cool air and also at night. Denies any recent history of hospitalization for asthma  Patient is a 49 y.o. female presenting with cough and shortness of breath. The history is provided by the patient.  Cough Associated symptoms: shortness of breath   Shortness of Breath Associated symptoms: cough     Past Medical History  Diagnosis Date  . Migraines   . Asthma   . Anemia   . Migraine   . Anxiety   . Depression   . Termination of pregnancy     x 2  . PONV (postoperative nausea and vomiting)   . Dysrhythmia     Irregular heat w/ asthma episodes and anxiety  . Sleep apnea     uses CPAP  . GERD (gastroesophageal reflux disease)   . Chronic kidney disease     frequent urination- tx w/ vesicare   Past Surgical History  Procedure Laterality Date  . Ablation    . Cesarean section      x3  . Mab      x 4  . Dilation and curettage of uterus  05/31/2005    MAB   . Tubal ligation  11/01/2006  . Leep    . Hernia repair  1990's  . Wisdom tooth extraction    . Mouth surgery      teeth extraction  . Hysteroscopy w/ endometrial ablation     Family History  Problem Relation Age of Onset  . Diabetes Maternal Grandmother   . Anesthesia problems Neg Hx    History   Substance Use Topics  . Smoking status: Former Smoker -- 0.25 packs/day for 15 years    Types: Cigarettes    Quit date: 02/16/2003  . Smokeless tobacco: Never Used  . Alcohol Use: Yes     Comment: socially   OB History    Gravida Para Term Preterm AB TAB SAB Ectopic Multiple Living   15 8 8  0 7 1 2   4      Review of Systems  Respiratory: Positive for cough and shortness of breath.   All other systems reviewed and are negative.     Allergies  Amitriptyline; Fluticasone-salmeterol; Penicillins; Ciprofloxacin; Macrobid; Nitrofurantoin; and Sulfamethoxazole-trimethoprim  Home Medications   Prior to Admission medications   Medication Sig Start Date End Date Taking? Authorizing Provider  albuterol (PROVENTIL) (2.5 MG/3ML) 0.083% nebulizer solution Take 3 mLs (2.5 mg total) by nebulization every 4 (four) hours as needed. 08/10/14   Hinckley, MD  BIOTIN PO Take 2,000 mg by mouth daily.    Historical Provider, MD  cetirizine (ZYRTEC) 10 MG tablet Take 1 tablet (10 mg total) by mouth daily. 08/10/14   Sharon Mt Street, MD  citalopram (CELEXA) 40 MG tablet Take 10 mg by  mouth daily for 7 days, and then 20 mg a day 12/14/14   Renee A Kuneff, DO  esomeprazole (NEXIUM) 40 MG packet Take 40 mg by mouth daily before breakfast. 10/01/14   Tamela Oddi Hess, DO  fluticasone (FLONASE) 50 MCG/ACT nasal spray PLACE 2 SPRAYS INTO THE NOSE DAILY. 04/15/14   Renee A Kuneff, DO  ibuprofen (ADVIL,MOTRIN) 600 MG tablet Take 1 tablet (600 mg total) by mouth every 6 (six) hours as needed for pain. For pain 12/18/11   Osborne Oman, MD  montelukast (SINGULAIR) 10 MG tablet Take 1 tablet (10 mg total) by mouth at bedtime. 08/10/14   Tappen, MD  ondansetron (ZOFRAN ODT) 4 MG disintegrating tablet Take 1 tablet (4 mg total) by mouth every 6 (six) hours as needed. For nausea 03/29/11   Alveda Reasons, MD  oxybutynin (DITROPAN-XL) 10 MG 24 hr tablet Take 10 mg by mouth as needed. 06/30/13    Leone Haven, MD  propranolol (INDERAL) 80 MG tablet Take 80 mg by mouth as needed.    Historical Provider, MD  Pyridoxine HCl (VITAMIN B-6 PO) Take 1 tablet by mouth daily. Unknown OTC strength    Historical Provider, MD  Spacer/Aero-Holding Chambers (AEROCHAMBER PLUS) inhaler 1 each by Other route once. Please provide patient with 1 spacer for use with her inhalers Use as instructed     Historical Provider, MD  SYMBICORT 160-4.5 MCG/ACT inhaler Inhale 2 puffs into the lungs 2 (two) times daily. 08/10/14   Maguayo, MD  VENTOLIN HFA 108 (90 BASE) MCG/ACT inhaler Inhale 2 puffs into the lungs every 4 (four) hours as needed for wheezing or shortness of breath. 08/10/14   Sharon Mt [address], MD  verapamil (CALAN) 40 MG tablet Take 40 mg by mouth as needed. When gets palpitations    Historical Provider, MD   BP 136/67 mmHg  Pulse 83  Temp(Src) 98.2 F (36.8 C) (Oral)  Resp 20  SpO2 98% Physical Exam  Constitutional: She is oriented to person, place, and time. She appears well-developed and well-nourished.  Non-toxic appearance. No distress.  HENT:  Head: Normocephalic and atraumatic.  Eyes: Conjunctivae, EOM and lids are normal. Pupils are equal, round, and reactive to light.  Neck: Normal range of motion. Neck supple. No tracheal deviation present. No thyroid mass present.  Cardiovascular: Normal rate, regular rhythm and normal heart sounds.  Exam reveals no gallop.   No murmur heard. Pulmonary/Chest: Effort normal. No stridor. No respiratory distress. She has decreased breath sounds. She has wheezes. She has no rhonchi. She has no rales.  Abdominal: Soft. Normal appearance and bowel sounds are normal. She exhibits no distension. There is no tenderness. There is no rebound and no CVA tenderness.  Musculoskeletal: Normal range of motion. She exhibits no edema or tenderness.  Neurological: She is alert and oriented to person, place, and time. She has normal strength. No  cranial nerve deficit or sensory deficit. GCS eye subscore is 4. GCS verbal subscore is 5. GCS motor subscore is 6.  Skin: Skin is warm and dry. No abrasion and no rash noted.  Psychiatric: She has a normal mood and affect. Her speech is normal and behavior is normal.  Nursing note and vitals reviewed.   ED Course  Procedures (including critical care time) Labs Review Labs Reviewed - No data to display  Imaging Review Dg Chest 2 View (if Patient Has Fever And/or Copd)  12/29/2014   CLINICAL DATA:  Cough, shortness of Breath  for 1 week  EXAM: CHEST  2 VIEW  COMPARISON:  07/28/2014  FINDINGS: Cardiomediastinal silhouette is stable. No acute infiltrate or pleural effusion. No pulmonary edema. Bony thorax is unremarkable.  IMPRESSION: No active cardiopulmonary disease.   Electronically Signed   By: Lahoma Crocker M.D.   On: 12/29/2014 10:30     EKG Interpretation None      MDM   Final diagnoses:  None    Patient given albuterol 10 mg continuous treatment along with prednisone 60 mg by mouth. Feels better at this time and is stable for discharge   Leota Jacobsen, MD 12/29/14 1330

## 2014-12-29 NOTE — Discharge Instructions (Signed)
Bronchospasm °A bronchospasm is a spasm or tightening of the airways going into the lungs. During a bronchospasm breathing becomes more difficult because the airways get smaller. When this happens there can be coughing, a whistling sound when breathing (wheezing), and difficulty breathing. Bronchospasm is often associated with asthma, but not all patients who experience a bronchospasm have asthma. °CAUSES  °A bronchospasm is caused by inflammation or irritation of the airways. The inflammation or irritation may be triggered by:  °· Allergies (such as to animals, pollen, food, or mold). Allergens that cause bronchospasm may cause wheezing immediately after exposure or many hours later.   °· Infection. Viral infections are believed to be the most common cause of bronchospasm.   °· Exercise.   °· Irritants (such as pollution, cigarette smoke, strong odors, aerosol sprays, and paint fumes).   °· Weather changes. Winds increase molds and pollens in the air. Rain refreshes the air by washing irritants out. Cold air may cause inflammation.   °· Stress and emotional upset.   °SIGNS AND SYMPTOMS  °· Wheezing.   °· Excessive nighttime coughing.   °· Frequent or severe coughing with a simple cold.   °· Chest tightness.   °· Shortness of breath.   °DIAGNOSIS  °Bronchospasm is usually diagnosed through a history and physical exam. Tests, such as chest X-rays, are sometimes done to look for other conditions. °TREATMENT  °· Inhaled medicines can be given to open up your airways and help you breathe. The medicines can be given using either an inhaler or a nebulizer machine. °· Corticosteroid medicines may be given for severe bronchospasm, usually when it is associated with asthma. °HOME CARE INSTRUCTIONS  °· Always have a plan prepared for seeking medical care. Know when to call your health care provider and local emergency services (911 in the [place] Know where you can access local emergency care. °· Only take medicines as  directed by your health care provider. °· If you were prescribed an inhaler or nebulizer machine, ask your health care provider to explain how to use it correctly. Always use a spacer with your inhaler if you were given one. °· It is necessary to remain calm during an attack. Try to relax and breathe more slowly.  °· Control your home environment in the following ways:   °¨ Change your heating and air conditioning filter at least once a month.   °¨ Limit your use of fireplaces and wood stoves. °¨ Do not smoke and do not allow smoking in your home.   °¨ Avoid exposure to perfumes and fragrances.   °¨ Get rid of pests (such as roaches and mice) and their droppings.   °¨ Throw away plants if you see mold on them.   °¨ Keep your house clean and dust free.   °¨ Replace carpet with wood, tile, or vinyl flooring. Carpet can trap dander and dust.   °¨ Use allergy-proof pillows, mattress covers, and box spring covers.   °¨ Wash bed sheets and blankets every week in hot water and dry them in a dryer.   °¨ Use blankets that are made of polyester or cotton.   °¨ Wash hands frequently. °SEEK MEDICAL CARE IF:  °· You have muscle aches.   °· You have chest pain.   °· The sputum changes from clear or white to yellow, green, gray, or bloody.   °· The sputum you cough up gets thicker.   °· There are problems that may be related to the medicine you are given, such as a rash, itching, swelling, or trouble breathing.   °SEEK IMMEDIATE MEDICAL CARE IF:  °· You have worsening wheezing and coughing even   after taking your prescribed medicines.   °· You have increased difficulty breathing.   °· You develop severe chest pain. °MAKE SURE YOU:  °· Understand these instructions. °· Will watch your condition. °· Will get help right away if you are not doing well or get worse. °Document Released: 10/18/2003 Document Revised: 10/20/2013 Document Reviewed: 04/06/2013 °Exit[employer]® Patient Information ©2015 [employer]. This information is not  intended to replace advice given to you by your health care provider. Make sure you discuss any questions you have with your health care provider. ° °

## 2014-12-29 NOTE — ED Notes (Signed)
MD at bedside. 

## 2014-12-31 ENCOUNTER — Ambulatory Visit (INDEPENDENT_AMBULATORY_CARE_PROVIDER_SITE_OTHER): Payer: 59 | Admitting: Family Medicine

## 2014-12-31 ENCOUNTER — Encounter: Payer: Self-pay | Admitting: Family Medicine

## 2014-12-31 VITALS — BP 139/89 | HR 74 | Temp 98.6°F | Ht 61.0 in | Wt 186.0 lb

## 2014-12-31 DIAGNOSIS — J4551 Severe persistent asthma with (acute) exacerbation: Secondary | ICD-10-CM

## 2014-12-31 MED ORDER — ALBUTEROL SULFATE (2.5 MG/3ML) 0.083% IN NEBU: 2.5000 mg | INHALATION_SOLUTION | RESPIRATORY_TRACT | Status: DC | PRN

## 2014-12-31 MED ORDER — VENTOLIN HFA 108 (90 BASE) MCG/ACT IN AERS: 2.0000 | INHALATION_SPRAY | RESPIRATORY_TRACT | Status: DC | PRN

## 2014-12-31 MED ORDER — AEROCHAMBER PLUS MISC: Status: DC

## 2014-12-31 MED ORDER — BUDESONIDE-FORMOTEROL FUMARATE 160-4.5 MCG/ACT IN AERO: 2.0000 | INHALATION_SPRAY | Freq: Two times a day (BID) | RESPIRATORY_TRACT | Status: DC

## 2014-12-31 NOTE — Progress Notes (Signed)
   Subjective:   [name] is a 49 y.o. female with a history of asthma, allergic rhinitis, OSA here for "asthma attack".  Asthma: - had about 1 wk of SOB and URI symptoms - using albuterol before and after work daily - worse with walking to and from car and in cold windy air - Was seen in [facility] ED on 3/2 for cough and SOB and given albuterol and prednisone x5d and tessalon and discharged home - Patient has been feeling tired and SOB since being seen in ED - has been getting better, but still coughing at night - needs nebulizer machine - is broken - thinks nebs work better - trouble sleeping with CPAP on at night while coughing - taking symbicort as prescribed - needs new spacer for inhalers as well  Review of Systems:  Per HPI. All other systems reviewed and are negative.   PMH, PSH, Medications, Allergies, and FmHx reviewed and updated in EMR.  Social History: former smoker  Objective:  There were no vitals taken for this visit.  Gen:  49 y.o. female in NAD HEENT: NCAT, MMM, anicteric sclerae CV: RRR, no MRG Resp: Non-labored, CTAB, no wheezes noted Ext: WWP, no edema Neuro: Alert and oriented, speech normal     Assessment:     [name] is a 49 y.o. female here for asthma exacerbation f/u.    Plan:     See problem list for problem-specific plans.   Lavon Paganini, MD PGY-1,  [facility] 12/31/2014  2:38 PM

## 2014-12-31 NOTE — Assessment & Plan Note (Signed)
Normal WOB and lungs clear on exam Continue symbicort Finish 5d course of prednisone Albuterol prn Rx for spacer and neb machine given Explained that MDI + spacer is just as effective as nebs F/u if not improving

## 2014-12-31 NOTE — Patient Instructions (Signed)
It was nice to meet you today. I'm refilling your asthma medicines and writing a prescription for a nebulizer machine. You will need to take the prescription for the nebulizer machine to a medical supply store for filling.  It is important to always use a spacer with your inhalers.  Follow-up with your PCP if your asthma is not getting better after finishing the prednisone.  Take care, Dr. B  Asthma Attack Prevention Although there is no way to prevent asthma from starting, you can take steps to control the disease and reduce its symptoms. Learn about your asthma and how to control it. Take an active role to control your asthma by working with your health care provider to create and follow an asthma action plan. An asthma action plan guides you in:  Taking your medicines properly.  Avoiding things that set off your asthma or make your asthma worse (asthma triggers).  Tracking your level of asthma control.  Responding to worsening asthma.  Seeking emergency care when needed. To track your asthma, keep records of your symptoms, check your peak flow number using a handheld device that shows how well air moves out of your lungs (peak flow meter), and get regular asthma checkups.  WHAT ARE SOME WAYS TO PREVENT AN ASTHMA ATTACK?  Take medicines as directed by your health care provider.  Keep track of your asthma symptoms and level of control.  With your health care provider, write a detailed plan for taking medicines and managing an asthma attack. Then be sure to follow your action plan. Asthma is an ongoing condition that needs regular monitoring and treatment.  Identify and avoid asthma triggers. Many outdoor allergens and irritants (such as pollen, mold, cold air, and air pollution) can trigger asthma attacks. Find out what your asthma triggers are and take steps to avoid them.  Monitor your breathing. Learn to recognize warning signs of an attack, such as coughing, wheezing, or shortness  of breath. Your lung function may decrease before you notice any signs or symptoms, so regularly measure and record your peak airflow with a home peak flow meter.  Identify and treat attacks early. If you act quickly, you are less likely to have a severe attack. You will also need less medicine to control your symptoms. When your peak flow measurements decrease and alert you to an upcoming attack, take your medicine as instructed and immediately stop any activity that may have triggered the attack. If your symptoms do not improve, get medical help.  Pay attention to increasing quick-relief inhaler use. If you find yourself relying on your quick-relief inhaler, your asthma is not under control. See your health care provider about adjusting your treatment. WHAT CAN MAKE MY SYMPTOMS WORSE? A number of common things can set off or make your asthma symptoms worse and cause temporary increased inflammation of your airways. Keep track of your asthma symptoms for several weeks, detailing all the environmental and emotional factors that are linked with your asthma. When you have an asthma attack, go back to your asthma diary to see which factor, or combination of factors, might have contributed to it. Once you know what these factors are, you can take steps to control many of them. If you have allergies and asthma, it is important to take asthma prevention steps at home. Minimizing contact with the substance to which you are allergic will help prevent an asthma attack. Some triggers and ways to avoid these triggers are: Animal Dander:  Some people are allergic  to the flakes of skin or dried saliva from animals with fur or feathers.   There is no such thing as a hypoallergenic dog or cat breed. All dogs or cats can cause allergies, even if they don't shed.  Keep these pets out of your home.  If you are not able to keep a pet outdoors, keep the pet out of your bedroom and other sleeping areas at all times, and  keep the door closed.  Remove carpets and furniture covered with cloth from your home. If that is not possible, keep the pet away from fabric-covered furniture and carpets. Dust Mites: Many people with asthma are allergic to dust mites. Dust mites are tiny bugs that are found in every home in mattresses, pillows, carpets, fabric-covered furniture, bedcovers, clothes, stuffed toys, and other fabric-covered items.   Cover your mattress in a special dust-proof cover.  Cover your pillow in a special dust-proof cover, or wash the pillow each week in hot water. Water must be hotter than 130 F (54.4 C) to kill dust mites. Cold or warm water used with detergent and bleach can also be effective.  Wash the sheets and blankets on your bed each week in hot water.  Try not to sleep or lie on cloth-covered cushions.  Call ahead when traveling and ask for a smoke-free hotel room. Bring your own bedding and pillows in case the hotel only supplies feather pillows and down comforters, which may contain dust mites and cause asthma symptoms.  Remove carpets from your bedroom and those laid on concrete, if you can.  Keep stuffed toys out of the bed, or wash the toys weekly in hot water or cooler water with detergent and bleach. Cockroaches: Many people with asthma are allergic to the droppings and remains of cockroaches.   Keep food and garbage in closed containers. Never leave food out.  Use poison baits, traps, powders, gels, or paste (for example, boric acid).  If a spray is used to kill cockroaches, stay out of the room until the odor goes away. Indoor Mold:  Fix leaky faucets, pipes, or other sources of water that have mold around them.  Clean floors and moldy surfaces with a fungicide or diluted bleach.  Avoid using humidifiers, vaporizers, or swamp coolers. These can spread molds through the air. Pollen and Outdoor Mold:  When pollen or mold spore counts are high, try to keep your windows  closed.  Stay indoors with windows closed from late morning to afternoon. Pollen and some mold spore counts are highest at that time.  Ask your health care provider whether you need to take anti-inflammatory medicine or increase your dose of the medicine before your allergy season starts. Other Irritants to Avoid:  Tobacco smoke is an irritant. If you smoke, ask your health care provider how you can quit. Ask family members to quit smoking, too. Do not allow smoking in your home or car.  If possible, do not use a wood-burning stove, kerosene heater, or fireplace. Minimize exposure to all sources of smoke, including incense, candles, fires, and fireworks.  Try to stay away from strong odors and sprays, such as perfume, talcum powder, hair spray, and paints.  Decrease humidity in your home and use an indoor air cleaning device. Reduce indoor humidity to below 60%. Dehumidifiers or central air conditioners can do this.  Decrease house dust exposure by changing furnace and air cooler filters frequently.  Try to have someone else vacuum for you once or twice a week.  Stay out of rooms while they are being vacuumed and for a short while afterward.  If you vacuum, use a dust mask from a hardware store, a double-layered or microfilter vacuum cleaner bag, or a vacuum cleaner with a HEPA filter.  Sulfites in foods and beverages can be irritants. Do not drink beer or wine or eat dried fruit, processed potatoes, or shrimp if they cause asthma symptoms.  Cold air can trigger an asthma attack. Cover your nose and mouth with a scarf on cold or windy days.  Several health conditions can make asthma more difficult to manage, including a runny nose, sinus infections, reflux disease, psychological stress, and sleep apnea. Work with your health care provider to manage these conditions.  Avoid close contact with people who have a respiratory infection such as a cold or the flu, since your asthma symptoms may  get worse if you catch the infection. Wash your hands thoroughly after touching items that may have been handled by people with a respiratory infection.  Get a flu shot every year to protect against the flu virus, which often makes asthma worse for days or weeks. Also get a pneumonia shot if you have not previously had one. Unlike the flu shot, the pneumonia shot does not need to be given yearly. Medicines:  Talk to your health care provider about whether it is safe for you to take aspirin or non-steroidal anti-inflammatory medicines (NSAIDs). In a small number of people with asthma, aspirin and NSAIDs can cause asthma attacks. These medicines must be avoided by people who have known aspirin-sensitive asthma. It is important that people with aspirin-sensitive asthma read labels of all over-the-counter medicines used to treat pain, colds, coughs, and fever.  Beta-blockers and ACE inhibitors are other medicines you should discuss with your health care provider. HOW CAN I FIND OUT WHAT I AM ALLERGIC TO? Ask your asthma health care provider about allergy skin testing or blood testing (the RAST test) to identify the allergens to which you are sensitive. If you are found to have allergies, the most important thing to do is to try to avoid exposure to any allergens that you are sensitive to as much as possible. Other treatments for allergies, such as medicines and allergy shots (immunotherapy) are available.  CAN I EXERCISE? Follow your health care provider's advice regarding asthma treatment before exercising. It is important to maintain a regular exercise program, but vigorous exercise or exercise in cold, humid, or dry environments can cause asthma attacks, especially for those people who have exercise-induced asthma. Document Released: 10/03/2009 Document Revised: 10/20/2013 Document Reviewed: 04/22/2013 [facility] Patient Information 2015 [place], [place]. This information is not intended to replace advice  given to you by your health care provider. Make sure you discuss any questions you have with your health care provider.

## 2015-01-14 ENCOUNTER — Encounter: Payer: Self-pay | Admitting: Obstetrics & Gynecology

## 2015-01-14 ENCOUNTER — Ambulatory Visit (INDEPENDENT_AMBULATORY_CARE_PROVIDER_SITE_OTHER): Payer: 59 | Admitting: Obstetrics & Gynecology

## 2015-01-14 VITALS — BP 130/67 | HR 69 | Temp 98.3°F | Ht 61.0 in | Wt 187.0 lb

## 2015-01-14 DIAGNOSIS — Z113 Encounter for screening for infections with a predominantly sexual mode of transmission: Secondary | ICD-10-CM

## 2015-01-14 DIAGNOSIS — Z118 Encounter for screening for other infectious and parasitic diseases: Secondary | ICD-10-CM

## 2015-01-14 DIAGNOSIS — Z1151 Encounter for screening for human papillomavirus (HPV): Secondary | ICD-10-CM

## 2015-01-14 DIAGNOSIS — Z124 Encounter for screening for malignant neoplasm of cervix: Secondary | ICD-10-CM

## 2015-01-14 DIAGNOSIS — N951 Menopausal and female climacteric states: Secondary | ICD-10-CM

## 2015-01-14 DIAGNOSIS — Z01419 Encounter for gynecological examination (general) (routine) without abnormal findings: Secondary | ICD-10-CM

## 2015-01-14 MED ORDER — ESTRADIOL-LEVONORGESTREL 0.045-0.015 MG/DAY TD PTWK: 1.0000 | MEDICATED_PATCH | TRANSDERMAL | Status: DC

## 2015-01-14 MED ORDER — ESTRADIOL-NORETHINDRONE ACET 0.05-0.25 MG/DAY TD PTTW
1.0000 | MEDICATED_PATCH | TRANSDERMAL | Status: DC
Start: 2015-01-14 — End: 2015-01-19

## 2015-01-14 MED ORDER — ESTRADIOL 0.1 MG/GM VA CREA: TOPICAL_CREAM | VAGINAL | Status: DC

## 2015-01-14 NOTE — Progress Notes (Signed)
[facility] PREVENTATIVE CARE ENCOUNTER NOTE  Subjective:     [name] is a 49 y.o. [id number] female here for a routine annual gynecologic exam.  Current complaints: bothersome vasomotor symptoms that are debilitating especially the night sweats and hot flashes.  Takes an SSRI, this is not helping. Also reports vaginal dryness that makes intercourse very painful.  Desires therapy to address both concerns. Desires STI screen.   Gynecologic History Patient's last menstrual period was 12/30/2014 (exact date). Contraception: tubal ligation Last Pap: 12/21/13. Results were: normal pap, negative HPV, GC and Chlam.  Last mammogram: 12/03/14. Results were: normal  Obstetric History OB History  Gravida Para Term Preterm AB SAB TAB Ectopic Multiple Living  15 8 8  0 7 2 1   4     # Outcome Date GA Lbr Len/2nd Weight Sex Delivery Anes PTL Lv  15 AB           14 AB           13 AB           12 AB           11 Term      CS-LTranv     10 Term      CS-LTranv     9 Term      CS-LTranv     8 Term      Vag-Spont     7 SAB           6 SAB           5 TAB           4 Term           3 Term           2 Term           1 Term               History   Social History  . Marital Status: Single    Spouse Name: N/A  . Number of Children: N/A  . Years of Education: N/A   Occupational History  . Not on file.   Social History Main Topics  . Smoking status: Former Smoker -- 0.25 packs/day for 15 years    Types: Cigarettes    Quit date: 02/16/2003  . Smokeless tobacco: Never Used  . Alcohol Use: Yes     Comment: socially  . Drug Use: No  . Sexual Activity:    Partners: Male    Birth Control/ Protection: Surgical   Other Topics Concern  . Not on file   Social History Narrative   ** Merged History Encounter **        The following portions of the patient's history were reviewed and updated as appropriate: allergies, current medications, past family history, past medical  history, past social history, past surgical history and problem list.  Review of Systems Pertinent items are noted in HPI.   Objective:   BP 130/67 mmHg  Pulse 69  Temp(Src) 98.3 F (36.8 C)  Ht 5\' 1"  (1.549 m)  Wt 187 lb (84.823 kg)  BMI 35.35 kg/m2  LMP 12/30/2014 (Exact Date) GENERAL: Well-developed, well-nourished female in no acute distress.  HEENT: Normocephalic, atraumatic. Sclerae anicteric.  NECK: Supple. Normal thyroid.  LUNGS: Clear to auscultation bilaterally.  HEART: Regular rate and rhythm. BREASTS: Symmetric in size. No masses, skin changes, nipple drainage, or lymphadenopathy. ABDOMEN: Soft, nontender, nondistended. No organomegaly. PELVIC: Normal external  female genitalia. Vagina is pink and rugated.  Normal discharge. Normal cervix contour. Pap smear obtained. Uterus is normal in size. No adnexal mass or tenderness.  EXTREMITIES: No cyanosis, clubbing, or edema, 2+ distal pulses.   Assessment:   Annual gynecologic examination Menopausal symptoms   Plan:   Pap and STI screen done, will follow up results and manage accordingly. Discussed hormone therapy for her symptoms, patient wants a patch formation.  Needs estrogen + progestin as she still has a uterus. Combipatch vs Climara Pro prdered; she will get cheaper option and we will remove the one she is not taking form her medication list. She will call to let us know which one she fills it Vaginal estrogen cream also prescribed for severe dryness; also encouraged to use OTC lubrication. Routine preventative health maintenance measures emphasized   Verita Schneiders, MD, [place] Attending [place] [facility] [place]

## 2015-01-14 NOTE — Patient Instructions (Signed)
Preventive Care for Adults A healthy lifestyle and preventive care can promote health and wellness. Preventive health guidelines for women include the following key practices.  A routine yearly physical is a good way to check with your health care provider about your health and preventive screening. It is a chance to share any concerns and updates on your health and to receive a thorough exam.  Visit your dentist for a routine exam and preventive care every 6 months. Brush your teeth twice a day and floss once a day. Good oral hygiene prevents tooth decay and gum disease.  The frequency of eye exams is based on your age, health, family medical history, use of contact lenses, and other factors. Follow your health care provider's recommendations for frequency of eye exams.  Eat a healthy diet. Foods like vegetables, fruits, whole grains, low-fat dairy products, and lean protein foods contain the nutrients you need without too many calories. Decrease your intake of foods high in solid fats, added sugars, and salt. Eat the right amount of calories for you.Get information about a proper diet from your health care provider, if necessary.  Regular physical exercise is one of the most important things you can do for your health. Most adults should get at least 150 minutes of moderate-intensity exercise (any activity that increases your heart rate and causes you to sweat) each week. In addition, most adults need muscle-strengthening exercises on 2 or more days a week.  Maintain a healthy weight. The body mass index (BMI) is a screening tool to identify possible weight problems. It provides an estimate of body fat based on height and weight. Your health care provider can find your BMI and can help you achieve or maintain a healthy weight.For adults 20 years and older:  A BMI below 18.5 is considered underweight.  A BMI of 18.5 to 24.9 is normal.  A BMI of 25 to 29.9 is considered overweight.  A BMI of  30 and above is considered obese.  Maintain normal blood lipids and cholesterol levels by exercising and minimizing your intake of saturated fat. Eat a balanced diet with plenty of fruit and vegetables. Blood tests for lipids and cholesterol should begin at age 76 and be repeated every 5 years. If your lipid or cholesterol levels are high, you are over 50, or you are at high risk for heart disease, you may need your cholesterol levels checked more frequently.Ongoing high lipid and cholesterol levels should be treated with medicines if diet and exercise are not working.  If you smoke, find out from your health care provider how to quit. If you do not use tobacco, do not start.  Lung cancer screening is recommended for adults aged 22-80 years who are at high risk for developing lung cancer because of a history of smoking. A yearly low-dose CT scan of the lungs is recommended for people who have at least a 30-pack-year history of smoking and are a current smoker or have quit within the past 15 years. A pack year of smoking is smoking an average of 1 pack of cigarettes a day for 1 year (for example: 1 pack a day for 30 years or 2 packs a day for 15 years). Yearly screening should continue until the smoker has stopped smoking for at least 15 years. Yearly screening should be stopped for people who develop a health problem that would prevent them from having lung cancer treatment.  If you are pregnant, do not drink alcohol. If you are breastfeeding,  be very cautious about drinking alcohol. If you are not pregnant and choose to drink alcohol, do not have more than 1 drink per day. One drink is considered to be 12 ounces (355 mL) of beer, 5 ounces (148 mL) of wine, or 1.5 ounces (44 mL) of liquor.  Avoid use of street drugs. Do not share needles with anyone. Ask for help if you need support or instructions about stopping the use of drugs.  High blood pressure causes heart disease and increases the risk of  stroke. Your blood pressure should be checked at least every 1 to 2 years. Ongoing high blood pressure should be treated with medicines if weight loss and exercise do not work.  If you are 3-86 years old, ask your health care provider if you should take aspirin to prevent strokes.  Diabetes screening involves taking a blood sample to check your fasting blood sugar level. This should be done once every 3 years, after age 67, if you are within normal weight and without risk factors for diabetes. Testing should be considered at a younger age or be carried out more frequently if you are overweight and have at least 1 risk factor for diabetes.  Breast cancer screening is essential preventive care for women. You should practice "breast self-awareness." This means understanding the normal appearance and feel of your breasts and may include breast self-examination. Any changes detected, no matter how small, should be reported to a health care provider. Women in their 8s and 30s should have a clinical breast exam (CBE) by a health care provider as part of a regular health exam every 1 to 3 years. After age 70, women should have a CBE every year. Starting at age 25, women should consider having a mammogram (breast X-ray test) every year. Women who have a family history of breast cancer should talk to their health care provider about genetic screening. Women at a high risk of breast cancer should talk to their health care providers about having an MRI and a mammogram every year.  Breast cancer gene (BRCA)-related cancer risk assessment is recommended for women who have family members with BRCA-related cancers. BRCA-related cancers include breast, ovarian, tubal, and peritoneal cancers. Having family members with these cancers may be associated with an increased risk for harmful changes (mutations) in the breast cancer genes BRCA1 and BRCA2. Results of the assessment will determine the need for genetic counseling and  BRCA1 and BRCA2 testing.  Routine pelvic exams to screen for cancer are no longer recommended for nonpregnant women who are considered low risk for cancer of the pelvic organs (ovaries, uterus, and vagina) and who do not have symptoms. Ask your health care provider if a screening pelvic exam is right for you.  If you have had past treatment for cervical cancer or a condition that could lead to cancer, you need Pap tests and screening for cancer for at least 20 years after your treatment. If Pap tests have been discontinued, your risk factors (such as having a new sexual partner) need to be reassessed to determine if screening should be resumed. Some women have medical problems that increase the chance of getting cervical cancer. In these cases, your health care provider may recommend more frequent screening and Pap tests.  The HPV test is an additional test that may be used for cervical cancer screening. The HPV test looks for the virus that can cause the cell changes on the cervix. The cells collected during the Pap test can be  tested for HPV. The HPV test could be used to screen women aged 30 years and older, and should be used in women of any age who have unclear Pap test results. After the age of 30, women should have HPV testing at the same frequency as a Pap test.  Colorectal cancer can be detected and often prevented. Most routine colorectal cancer screening begins at the age of 50 years and continues through age 75 years. However, your health care provider may recommend screening at an earlier age if you have risk factors for colon cancer. On a yearly basis, your health care provider may provide home test kits to check for hidden blood in the stool. Use of a small camera at the end of a tube, to directly examine the colon (sigmoidoscopy or colonoscopy), can detect the earliest forms of colorectal cancer. Talk to your health care provider about this at age 50, when routine screening begins. Direct  exam of the colon should be repeated every 5-10 years through age 75 years, unless early forms of pre-cancerous polyps or small growths are found.  People who are at an increased risk for hepatitis B should be screened for this virus. You are considered at high risk for hepatitis B if:  You were born in a country where hepatitis B occurs often. Talk with your health care provider about which countries are considered high risk.  Your parents were born in a high-risk country and you have not received a shot to protect against hepatitis B (hepatitis B vaccine).  You have HIV or AIDS.  You use needles to inject street drugs.  You live with, or have sex with, someone who has hepatitis B.  You get hemodialysis treatment.  You take certain medicines for conditions like cancer, organ transplantation, and autoimmune conditions.  Hepatitis C blood testing is recommended for all people born from 1945 through 1965 and any individual with known risks for hepatitis C.  Practice safe sex. Use condoms and avoid high-risk sexual practices to reduce the spread of sexually transmitted infections (STIs). STIs include gonorrhea, chlamydia, syphilis, trichomonas, herpes, HPV, and human immunodeficiency virus (HIV). Herpes, HIV, and HPV are viral illnesses that have no cure. They can result in disability, cancer, and death.  You should be screened for sexually transmitted illnesses (STIs) including gonorrhea and chlamydia if:  You are sexually active and are younger than 24 years.  You are older than 24 years and your health care provider tells you that you are at risk for this type of infection.  Your sexual activity has changed since you were last screened and you are at an increased risk for chlamydia or gonorrhea. Ask your health care provider if you are at risk.  If you are at risk of being infected with HIV, it is recommended that you take a prescription medicine daily to prevent HIV infection. This is  called preexposure prophylaxis (PrEP). You are considered at risk if:  You are a heterosexual woman, are sexually active, and are at increased risk for HIV infection.  You take drugs by injection.  You are sexually active with a partner who has HIV.  Talk with your health care provider about whether you are at high risk of being infected with HIV. If you choose to begin PrEP, you should first be tested for HIV. You should then be tested every 3 months for as long as you are taking PrEP.  Osteoporosis is a disease in which the bones lose minerals and strength   with aging. This can result in serious bone fractures or breaks. The risk of osteoporosis can be identified using a bone density scan. Women ages 65 years and over and women at risk for fractures or osteoporosis should discuss screening with their health care providers. Ask your health care provider whether you should take a calcium supplement or vitamin D to reduce the rate of osteoporosis.  Menopause can be associated with physical symptoms and risks. Hormone replacement therapy is available to decrease symptoms and risks. You should talk to your health care provider about whether hormone replacement therapy is right for you.  Use sunscreen. Apply sunscreen liberally and repeatedly throughout the day. You should seek shade when your shadow is shorter than you. Protect yourself by wearing long sleeves, pants, a wide-brimmed hat, and sunglasses year round, whenever you are outdoors.  Once a month, do a whole body skin exam, using a mirror to look at the skin on your back. Tell your health care provider of new moles, moles that have irregular borders, moles that are larger than a pencil eraser, or moles that have changed in shape or color.  Stay current with required vaccines (immunizations).  Influenza vaccine. All adults should be immunized every year.  Tetanus, diphtheria, and acellular pertussis (Td, Tdap) vaccine. Pregnant women should  receive 1 dose of Tdap vaccine during each pregnancy. The dose should be obtained regardless of the length of time since the last dose. Immunization is preferred during the 27th-36th week of gestation. An adult who has not previously received Tdap or who does not know her vaccine status should receive 1 dose of Tdap. This initial dose should be followed by tetanus and diphtheria toxoids (Td) booster doses every 10 years. Adults with an unknown or incomplete history of completing a 3-dose immunization series with Td-containing vaccines should begin or complete a primary immunization series including a Tdap dose. Adults should receive a Td booster every 10 years.  Varicella vaccine. An adult without evidence of immunity to varicella should receive 2 doses or a second dose if she has previously received 1 dose. Pregnant females who do not have evidence of immunity should receive the first dose after pregnancy. This first dose should be obtained before leaving the health care facility. The second dose should be obtained 4-8 weeks after the first dose.  Human papillomavirus (HPV) vaccine. Females aged 13-26 years who have not received the vaccine previously should obtain the 3-dose series. The vaccine is not recommended for use in pregnant females. However, pregnancy testing is not needed before receiving a dose. If a female is found to be pregnant after receiving a dose, no treatment is needed. In that case, the remaining doses should be delayed until after the pregnancy. Immunization is recommended for any person with an immunocompromised condition through the age of 26 years if she did not get any or all doses earlier. During the 3-dose series, the second dose should be obtained 4-8 weeks after the first dose. The third dose should be obtained 24 weeks after the first dose and 16 weeks after the second dose.  Zoster vaccine. One dose is recommended for adults aged 60 years or older unless certain conditions are  present.  Measles, mumps, and rubella (MMR) vaccine. Adults born before 1957 generally are considered immune to measles and mumps. Adults born in 1957 or later should have 1 or more doses of MMR vaccine unless there is a contraindication to the vaccine or there is laboratory evidence of immunity to   each of the three diseases. A routine second dose of MMR vaccine should be obtained at least 28 days after the first dose for students attending postsecondary schools, health care workers, or international travelers. People who received inactivated measles vaccine or an unknown type of measles vaccine during 1963-1967 should receive 2 doses of MMR vaccine. People who received inactivated mumps vaccine or an unknown type of mumps vaccine before 1979 and are at high risk for mumps infection should consider immunization with 2 doses of MMR vaccine. For females of childbearing age, rubella immunity should be determined. If there is no evidence of immunity, females who are not pregnant should be vaccinated. If there is no evidence of immunity, females who are pregnant should delay immunization until after pregnancy. Unvaccinated health care workers born before 1957 who lack laboratory evidence of measles, mumps, or rubella immunity or laboratory confirmation of disease should consider measles and mumps immunization with 2 doses of MMR vaccine or rubella immunization with 1 dose of MMR vaccine.  Pneumococcal 13-valent conjugate (PCV13) vaccine. When indicated, a person who is uncertain of her immunization history and has no record of immunization should receive the PCV13 vaccine. An adult aged 19 years or older who has certain medical conditions and has not been previously immunized should receive 1 dose of PCV13 vaccine. This PCV13 should be followed with a dose of pneumococcal polysaccharide (PPSV23) vaccine. The PPSV23 vaccine dose should be obtained at least 8 weeks after the dose of PCV13 vaccine. An adult aged 19  years or older who has certain medical conditions and previously received 1 or more doses of PPSV23 vaccine should receive 1 dose of PCV13. The PCV13 vaccine dose should be obtained 1 or more years after the last PPSV23 vaccine dose.  Pneumococcal polysaccharide (PPSV23) vaccine. When PCV13 is also indicated, PCV13 should be obtained first. All adults aged 65 years and older should be immunized. An adult younger than age 65 years who has certain medical conditions should be immunized. Any person who resides in a nursing home or long-term care facility should be immunized. An adult smoker should be immunized. People with an immunocompromised condition and certain other conditions should receive both PCV13 and PPSV23 vaccines. People with human immunodeficiency virus (HIV) infection should be immunized as soon as possible after diagnosis. Immunization during chemotherapy or radiation therapy should be avoided. Routine use of PPSV23 vaccine is not recommended for American Indians, [place] Natives, or people younger than 65 years unless there are medical conditions that require PPSV23 vaccine. When indicated, people who have unknown immunization and have no record of immunization should receive PPSV23 vaccine. One-time revaccination 5 years after the first dose of PPSV23 is recommended for people aged 19-64 years who have chronic kidney failure, nephrotic syndrome, asplenia, or immunocompromised conditions. People who received 1-2 doses of PPSV23 before age 65 years should receive another dose of PPSV23 vaccine at age 65 years or later if at least 5 years have passed since the previous dose. Doses of PPSV23 are not needed for people immunized with PPSV23 at or after age 65 years.  Meningococcal vaccine. Adults with asplenia or persistent complement component deficiencies should receive 2 doses of quadrivalent meningococcal conjugate (MenACWY-D) vaccine. The doses should be obtained at least 2 months apart.  Microbiologists working with certain meningococcal bacteria, military recruits, people at risk during an outbreak, and people who travel to or live in countries with a high rate of meningitis should be immunized. A first-year college student up through age   21 years who is living in a residence hall should receive a dose if she did not receive a dose on or after her 16th birthday. Adults who have certain high-risk conditions should receive one or more doses of vaccine.  Hepatitis A vaccine. Adults who wish to be protected from this disease, have certain high-risk conditions, work with hepatitis A-infected animals, work in hepatitis A research labs, or travel to or work in countries with a high rate of hepatitis A should be immunized. Adults who were previously unvaccinated and who anticipate close contact with an international adoptee during the first 60 days after arrival in the [place] from a country with a high rate of hepatitis A should be immunized.  Hepatitis B vaccine. Adults who wish to be protected from this disease, have certain high-risk conditions, may be exposed to blood or other infectious body fluids, are household contacts or sex partners of hepatitis B positive people, are clients or workers in certain care facilities, or travel to or work in countries with a high rate of hepatitis B should be immunized.  Haemophilus influenzae type b (Hib) vaccine. A previously unvaccinated person with asplenia or sickle cell disease or having a scheduled splenectomy should receive 1 dose of Hib vaccine. Regardless of previous immunization, a recipient of a hematopoietic stem cell transplant should receive a 3-dose series 6-12 months after her successful transplant. Hib vaccine is not recommended for adults with HIV infection. Preventive Services / Frequency Ages 64 to 68 years  Blood pressure check.** / Every 1 to 2 years.  Lipid and cholesterol check.** / Every 5 years beginning at age  22.  Clinical breast exam.** / Every 3 years for women in their 88s and 53s.  BRCA-related cancer risk assessment.** / For women who have family members with a BRCA-related cancer (breast, ovarian, tubal, or peritoneal cancers).  Pap test.** / Every 2 years from ages 90 through 51. Every 3 years starting at age 21 through age 56 or 3 with a history of 3 consecutive normal Pap tests.  HPV screening.** / Every 3 years from ages 24 through ages 1 to 46 with a history of 3 consecutive normal Pap tests.  Hepatitis C blood test.** / For any individual with known risks for hepatitis C.  Skin self-exam. / Monthly.  Influenza vaccine. / Every year.  Tetanus, diphtheria, and acellular pertussis (Tdap, Td) vaccine.** / Consult your health care provider. Pregnant women should receive 1 dose of Tdap vaccine during each pregnancy. 1 dose of Td every 10 years.  Varicella vaccine.** / Consult your health care provider. Pregnant females who do not have evidence of immunity should receive the first dose after pregnancy.  HPV vaccine. / 3 doses over 6 months, if 72 and younger. The vaccine is not recommended for use in pregnant females. However, pregnancy testing is not needed before receiving a dose.  Measles, mumps, rubella (MMR) vaccine.** / You need at least 1 dose of MMR if you were born in 1957 or later. You may also need a 2nd dose. For females of childbearing age, rubella immunity should be determined. If there is no evidence of immunity, females who are not pregnant should be vaccinated. If there is no evidence of immunity, females who are pregnant should delay immunization until after pregnancy.  Pneumococcal 13-valent conjugate (PCV13) vaccine.** / Consult your health care provider.  Pneumococcal polysaccharide (PPSV23) vaccine.** / 1 to 2 doses if you smoke cigarettes or if you have certain conditions.  Meningococcal vaccine.** /  1 dose if you are age 19 to 21 years and a first-year college  student living in a residence hall, or have one of several medical conditions, you need to get vaccinated against meningococcal disease. You may also need additional booster doses.  Hepatitis A vaccine.** / Consult your health care provider.  Hepatitis B vaccine.** / Consult your health care provider.  Haemophilus influenzae type b (Hib) vaccine.** / Consult your health care provider. Ages 40 to 64 years  Blood pressure check.** / Every 1 to 2 years.  Lipid and cholesterol check.** / Every 5 years beginning at age 20 years.  Lung cancer screening. / Every year if you are aged 55-80 years and have a 30-pack-year history of smoking and currently smoke or have quit within the past 15 years. Yearly screening is stopped once you have quit smoking for at least 15 years or develop a health problem that would prevent you from having lung cancer treatment.  Clinical breast exam.** / Every year after age 40 years.  BRCA-related cancer risk assessment.** / For women who have family members with a BRCA-related cancer (breast, ovarian, tubal, or peritoneal cancers).  Mammogram.** / Every year beginning at age 40 years and continuing for as long as you are in good health. Consult with your health care provider.  Pap test.** / Every 3 years starting at age 30 years through age 65 or 70 years with a history of 3 consecutive normal Pap tests.  HPV screening.** / Every 3 years from ages 30 years through ages 65 to 70 years with a history of 3 consecutive normal Pap tests.  Fecal occult blood test (FOBT) of stool. / Every year beginning at age 50 years and continuing until age 75 years. You may not need to do this test if you get a colonoscopy every 10 years.  Flexible sigmoidoscopy or colonoscopy.** / Every 5 years for a flexible sigmoidoscopy or every 10 years for a colonoscopy beginning at age 50 years and continuing until age 75 years.  Hepatitis C blood test.** / For all people born from 1945 through  1965 and any individual with known risks for hepatitis C.  Skin self-exam. / Monthly.  Influenza vaccine. / Every year.  Tetanus, diphtheria, and acellular pertussis (Tdap/Td) vaccine.** / Consult your health care provider. Pregnant women should receive 1 dose of Tdap vaccine during each pregnancy. 1 dose of Td every 10 years.  Varicella vaccine.** / Consult your health care provider. Pregnant females who do not have evidence of immunity should receive the first dose after pregnancy.  Zoster vaccine.** / 1 dose for adults aged 60 years or older.  Measles, mumps, rubella (MMR) vaccine.** / You need at least 1 dose of MMR if you were born in 1957 or later. You may also need a 2nd dose. For females of childbearing age, rubella immunity should be determined. If there is no evidence of immunity, females who are not pregnant should be vaccinated. If there is no evidence of immunity, females who are pregnant should delay immunization until after pregnancy.  Pneumococcal 13-valent conjugate (PCV13) vaccine.** / Consult your health care provider.  Pneumococcal polysaccharide (PPSV23) vaccine.** / 1 to 2 doses if you smoke cigarettes or if you have certain conditions.  Meningococcal vaccine.** / Consult your health care provider.  Hepatitis A vaccine.** / Consult your health care provider.  Hepatitis B vaccine.** / Consult your health care provider.  Haemophilus influenzae type b (Hib) vaccine.** / Consult your health care provider. Ages 65   years and over  Blood pressure check.** / Every 1 to 2 years.  Lipid and cholesterol check.** / Every 5 years beginning at age 22 years.  Lung cancer screening. / Every year if you are aged 73-80 years and have a 30-pack-year history of smoking and currently smoke or have quit within the past 15 years. Yearly screening is stopped once you have quit smoking for at least 15 years or develop a health problem that would prevent you from having lung cancer  treatment.  Clinical breast exam.** / Every year after age 4 years.  BRCA-related cancer risk assessment.** / For women who have family members with a BRCA-related cancer (breast, ovarian, tubal, or peritoneal cancers).  Mammogram.** / Every year beginning at age 40 years and continuing for as long as you are in good health. Consult with your health care provider.  Pap test.** / Every 3 years starting at age 9 years through age 34 or 91 years with 3 consecutive normal Pap tests. Testing can be stopped between 65 and 70 years with 3 consecutive normal Pap tests and no abnormal Pap or HPV tests in the past 10 years.  HPV screening.** / Every 3 years from ages 57 years through ages 64 or 45 years with a history of 3 consecutive normal Pap tests. Testing can be stopped between 65 and 70 years with 3 consecutive normal Pap tests and no abnormal Pap or HPV tests in the past 10 years.  Fecal occult blood test (FOBT) of stool. / Every year beginning at age 15 years and continuing until age 17 years. You may not need to do this test if you get a colonoscopy every 10 years.  Flexible sigmoidoscopy or colonoscopy.** / Every 5 years for a flexible sigmoidoscopy or every 10 years for a colonoscopy beginning at age 86 years and continuing until age 71 years.  Hepatitis C blood test.** / For all people born from [birth year] through 1964 and any individual with known risks for hepatitis C.  Osteoporosis screening.** / A one-time screening for women ages 83 years and over and women at risk for fractures or osteoporosis.  Skin self-exam. / Monthly.  Influenza vaccine. / Every year.  Tetanus, diphtheria, and acellular pertussis (Tdap/Td) vaccine.** / 1 dose of Td every 10 years.  Varicella vaccine.** / Consult your health care provider.  Zoster vaccine.** / 1 dose for adults aged 61 years or older.  Pneumococcal 13-valent conjugate (PCV13) vaccine.** / Consult your health care provider.  Pneumococcal  polysaccharide (PPSV23) vaccine.** / 1 dose for all adults aged 28 years and older.  Meningococcal vaccine.** / Consult your health care provider.  Hepatitis A vaccine.** / Consult your health care provider.  Hepatitis B vaccine.** / Consult your health care provider.  Haemophilus influenzae type b (Hib) vaccine.** / Consult your health care provider. ** Family history and personal history of risk and conditions may change your health care provider's recommendations. Document Released: 12/11/2001 Document Revised: 03/01/2014 Document Reviewed: 03/12/2011 [facility] Patient Information 2015 [place], [place]. This information is not intended to replace advice given to you by your health care provider. Make sure you discuss any questions you have with your health care provider.

## 2015-01-14 NOTE — Progress Notes (Signed)
Patient ID: [name], female   DOB: Jun 13, 1966, 49 y.o.   MRN: [id number] Pt is experiencing vaginal dryness at all times, hot flashes.  Pt is concerned about hormones changing.

## 2015-01-15 LAB — HEPATITIS C ANTIBODY: HCV Ab: NEGATIVE

## 2015-01-15 LAB — HEPATITIS B SURFACE ANTIGEN: Hepatitis B Surface Ag: NEGATIVE

## 2015-01-15 LAB — RPR

## 2015-01-15 LAB — HIV ANTIBODY (ROUTINE TESTING W REFLEX): HIV 1&2 Ab, 4th Generation: NONREACTIVE

## 2015-01-18 LAB — CYTOLOGY - PAP

## 2015-01-19 ENCOUNTER — Telehealth: Payer: Self-pay | Admitting: General Practice

## 2015-01-19 MED ORDER — CONJ ESTROG-MEDROXYPROGEST ACE 0.3-1.5 MG PO TABS: 1.0000 | ORAL_TABLET | Freq: Every day | ORAL | Status: DC

## 2015-01-19 NOTE — Telephone Encounter (Signed)
Patient called and left message stating she was here Friday for an appt and saw Dr Harolyn Rutherford and was given an estrogen patch. Patient states since then she has had numbness and tingling in her fingers and tightness in her joints. States she read this was a possible side effect and would like something else like a new patch or something. Called patient, no answer- unable to leave message due to full voicemail box. Note: Need to find out which patch patient is taking as two were prescribed. Patient is only taking one patch.

## 2015-01-19 NOTE — Telephone Encounter (Signed)
Patient returned call and states she can be contacted at [phone]. Called patient at number listed and discussed symptoms-- reports taking the Climarapro patch-- started taking Friday and ever since Saturday has been experiencing tingling and aching in fingers and legs-- requests new RX. Called Dr. Harolyn Rutherford at [phone] and discussed symptoms. Agreed to change medication per patient request to Prempro 0.3mg /1.5mg  #30 with 12 refills. Patient informed and agreeable to change. Medication e-prescribed to [facility] outpatient pharmacy. No further questions or concerns.

## 2015-02-09 ENCOUNTER — Other Ambulatory Visit: Payer: Self-pay | Admitting: Family Medicine

## 2015-02-25 ENCOUNTER — Telehealth: Payer: Self-pay | Admitting: Family Medicine

## 2015-02-25 ENCOUNTER — Ambulatory Visit (INDEPENDENT_AMBULATORY_CARE_PROVIDER_SITE_OTHER): Payer: 59 | Admitting: Family Medicine

## 2015-02-25 ENCOUNTER — Encounter: Payer: Self-pay | Admitting: Family Medicine

## 2015-02-25 VITALS — BP 121/69 | HR 71 | Temp 98.1°F | Ht 61.0 in | Wt 186.3 lb

## 2015-02-25 DIAGNOSIS — G43909 Migraine, unspecified, not intractable, without status migrainosus: Secondary | ICD-10-CM

## 2015-02-25 DIAGNOSIS — J4551 Severe persistent asthma with (acute) exacerbation: Secondary | ICD-10-CM | POA: Diagnosis not present

## 2015-02-25 NOTE — Assessment & Plan Note (Signed)
Paperwork, FMLA completed today for 2016.

## 2015-02-25 NOTE — Patient Instructions (Signed)
It was a pleasure getting to know you and taking care of you.  Your forms will be faxed today. Please keep the copy for your records.  I will call [facility] and see what they need to get your tubing replaced.

## 2015-02-25 NOTE — Progress Notes (Signed)
   Subjective:    Patient ID: [name], female    DOB: [birth year], 49 y.o.   MRN: [id number]  HPI  FMLA paperwork: Patient presents for appointment. Discuss her paperwork. Patient has yearly FMLA paperwork for her asthma and her migraine history. Patient's migraine and asthma started in 2004. She has been referred to the headache clinic in the past for her migraines.  Patient is on propranolol daily, celexa and zofran for her migraines. She has been tried on many different daily medications for her migraines without great success or SE. She takes Flonase, Singulair, albuterol and Symbicort for her asthma. Paperwork completed today in office.  Sleep apnea: Patient states that her sleep apnea machine was not working appropriately. She recently went and got a new machine, but is needing a prescription for connection tubing for her machine. She is uncertain what the make and model of her sleep apnea machine is. She receives her supplies, and [facility] on [facility] history. Former smoker Past Medical History  Diagnosis Date  . Migraines   . Asthma   . Anemia   . Anxiety   . Depression   . PONV (postoperative nausea and vomiting)   . Dysrhythmia     Irregular heat w/ asthma episodes and anxiety  . Sleep apnea     uses CPAP  . GERD (gastroesophageal reflux disease)   . Urinary incontinence     frequent urination- tx w/ vesicare   Allergies  Allergen Reactions  . Amitriptyline     Abnormal behavior. Just doesn't feel like normal self   . Fluticasone-Salmeterol Other (See Comments)    Thrush even with mouth rinsing; worsens cough  . Penicillins Itching and Swelling  . Ciprofloxacin Itching and Rash  . Macrobid [Nitrofurantoin Monohyd Macro] Itching  . Sulfamethoxazole-Trimethoprim Rash    Review of Systems Per HPI    Objective:   Physical Exam BP 121/69 mmHg  Pulse 71  Temp(Src) 98.1 F (36.7 C) (Oral)  Ht 5\' 1"  (1.549 m)  Wt 186 lb 4.8 oz (84.505 kg)   BMI 35.22 kg/m2 Gen: NAD. Nontoxic in appearance, mildly obese, African-American female. Very pleasant.     Assessment & Plan:

## 2015-02-25 NOTE — Assessment & Plan Note (Signed)
FMLA paperwork completed today. Asthma controlled.

## 2015-02-25 NOTE — Telephone Encounter (Signed)
1. FMLA paperwork completed today. Pt was given a copy for her records. Please make copy for file and fax for her.  2. In addition pt needs CPAP tubing. She does not know the tubing name/model of machine. I tried to call [facility] on [address] to get information and was placed on hold for sometime and then disconnected. Pt states "Jonni Sanger" at [employer] will be able to tell me what he needs so she can get new tubing. Please call him and ask what he needs from me or just a verbal order from the office will do. If he needs me to write a script, I will be happy to do so if he can tell us exactly what it needs to say. Thanks.

## 2015-03-01 NOTE — Telephone Encounter (Signed)
[place] today regarding CPAP tubing.  Per Representative, Jonni Sanger was with a patient.  The representative placed an order to the refill team for tubing.  Will call if anything else is needed for PCP.  Informed me (Tamika, RN) that pt had orders from Dr. Annamaria Boots, [place] on file.  Will forward to PCP.  Derl Barrow, RN

## 2015-03-29 ENCOUNTER — Encounter (HOSPITAL_COMMUNITY): Payer: Self-pay

## 2015-03-29 ENCOUNTER — Emergency Department (HOSPITAL_COMMUNITY): Payer: 59

## 2015-03-29 ENCOUNTER — Emergency Department (HOSPITAL_COMMUNITY)
Admission: EM | Admit: 2015-03-29 | Discharge: 2015-03-29 | Disposition: A | Discharge status: Home / Self Care | Payer: 59 | Attending: Emergency Medicine | Admitting: Emergency Medicine

## 2015-03-29 DIAGNOSIS — F329 Major depressive disorder, single episode, unspecified: Secondary | ICD-10-CM | POA: Insufficient documentation

## 2015-03-29 DIAGNOSIS — Z880 Allergy status to penicillin: Secondary | ICD-10-CM | POA: Diagnosis not present

## 2015-03-29 DIAGNOSIS — Z7951 Long term (current) use of inhaled steroids: Secondary | ICD-10-CM | POA: Insufficient documentation

## 2015-03-29 DIAGNOSIS — Z8679 Personal history of other diseases of the circulatory system: Secondary | ICD-10-CM | POA: Diagnosis not present

## 2015-03-29 DIAGNOSIS — K219 Gastro-esophageal reflux disease without esophagitis: Secondary | ICD-10-CM | POA: Insufficient documentation

## 2015-03-29 DIAGNOSIS — J40 Bronchitis, not specified as acute or chronic: Secondary | ICD-10-CM

## 2015-03-29 DIAGNOSIS — R0789 Other chest pain: Secondary | ICD-10-CM | POA: Diagnosis not present

## 2015-03-29 DIAGNOSIS — Z87891 Personal history of nicotine dependence: Secondary | ICD-10-CM | POA: Diagnosis not present

## 2015-03-29 DIAGNOSIS — Z79899 Other long term (current) drug therapy: Secondary | ICD-10-CM | POA: Diagnosis not present

## 2015-03-29 DIAGNOSIS — J45901 Unspecified asthma with (acute) exacerbation: Secondary | ICD-10-CM | POA: Diagnosis not present

## 2015-03-29 DIAGNOSIS — G43909 Migraine, unspecified, not intractable, without status migrainosus: Secondary | ICD-10-CM | POA: Diagnosis not present

## 2015-03-29 DIAGNOSIS — Z862 Personal history of diseases of the blood and blood-forming organs and certain disorders involving the immune mechanism: Secondary | ICD-10-CM | POA: Diagnosis not present

## 2015-03-29 DIAGNOSIS — R079 Chest pain, unspecified: Secondary | ICD-10-CM | POA: Diagnosis present

## 2015-03-29 DIAGNOSIS — G4730 Sleep apnea, unspecified: Secondary | ICD-10-CM | POA: Diagnosis not present

## 2015-03-29 DIAGNOSIS — F419 Anxiety disorder, unspecified: Secondary | ICD-10-CM | POA: Diagnosis not present

## 2015-03-29 LAB — CBC
HEMATOCRIT: 36.7 % (ref 36.0–46.0)
Hemoglobin: 12.6 g/dL (ref 12.0–15.0)
MCH: 31.7 pg (ref 26.0–34.0)
MCHC: 34.3 g/dL (ref 30.0–36.0)
MCV: 92.2 fL (ref 78.0–100.0)
Platelets: 269 10*3/uL (ref 150–400)
RBC: 3.98 MIL/uL (ref 3.87–5.11)
RDW: 12.4 % (ref 11.5–15.5)
WBC: 4.9 10*3/uL (ref 4.0–10.5)

## 2015-03-29 LAB — BASIC METABOLIC PANEL
ANION GAP: 8 (ref 5–15)
BUN: 12 mg/dL (ref 6–20)
CHLORIDE: 103 mmol/L (ref 101–111)
CO2: 27 mmol/L (ref 22–32)
CREATININE: 0.83 mg/dL (ref 0.44–1.00)
Calcium: 9.1 mg/dL (ref 8.9–10.3)
GFR calc Af Amer: >60 mL/min (ref >60)
GFR calc non Af Amer: >60 mL/min (ref >60)
Glucose, Bld: 110 mg/dL — ABNORMAL HIGH (ref 65–99)
POTASSIUM: 3.7 mmol/L (ref 3.5–5.1)
SODIUM: 138 mmol/L (ref 135–145)

## 2015-03-29 LAB — I-STAT TROPONIN, ED: Troponin i, poc: 0.01 ng/mL (ref 0.00–0.08)

## 2015-03-29 LAB — BRAIN NATRIURETIC PEPTIDE: B Natriuretic Peptide: 9.9 pg/mL (ref 0.0–100.0)

## 2015-03-29 LAB — D-DIMER, QUANTITATIVE: D-Dimer, Quant: <0.27 ug/mL-FEU (ref 0.00–0.48)

## 2015-03-29 MED ORDER — PREDNISONE 20 MG PO TABS
60.0000 mg | ORAL_TABLET | Freq: Once | ORAL | Status: AC
  Administered 2015-03-29: 60 mg via ORAL
  Filled 2015-03-29: qty 3

## 2015-03-29 MED ORDER — ALBUTEROL SULFATE (2.5 MG/3ML) 0.083% IN NEBU
5.0000 mg | INHALATION_SOLUTION | Freq: Once | RESPIRATORY_TRACT | Status: AC
Start: 2015-03-29 — End: 2015-03-29
  Administered 2015-03-29: 5 mg via RESPIRATORY_TRACT
  Filled 2015-03-29: qty 6

## 2015-03-29 MED ORDER — NITROGLYCERIN 0.4 MG SL SUBL: 0.4000 mg | SUBLINGUAL_TABLET | SUBLINGUAL | Status: DC | PRN

## 2015-03-29 MED ORDER — IBUPROFEN 800 MG PO TABS
800.0000 mg | ORAL_TABLET | Freq: Once | ORAL | Status: AC
  Administered 2015-03-29: 800 mg via ORAL
  Filled 2015-03-29: qty 1

## 2015-03-29 MED ORDER — ASPIRIN 325 MG PO TABS
325.0000 mg | ORAL_TABLET | ORAL | Status: AC
  Administered 2015-03-29: 325 mg via ORAL
  Filled 2015-03-29: qty 1

## 2015-03-29 MED ORDER — PREDNISONE 20 MG PO TABS: 20.0000 mg | ORAL_TABLET | Freq: Two times a day (BID) | ORAL | Status: DC

## 2015-03-29 MED ORDER — IPRATROPIUM BROMIDE 0.02 % IN SOLN
0.5000 mg | Freq: Once | RESPIRATORY_TRACT | Status: AC
  Administered 2015-03-29: 0.5 mg via RESPIRATORY_TRACT
  Filled 2015-03-29: qty 2.5

## 2015-03-29 MED ORDER — IBUPROFEN 600 MG PO TABS: 600.0000 mg | ORAL_TABLET | Freq: Three times a day (TID) | ORAL | Status: DC

## 2015-03-29 NOTE — ED Notes (Signed)
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  Used rescue inhaler prior.

## 2015-03-29 NOTE — ED Provider Notes (Signed)
CSN: [id number]     Arrival date & time 03/29/15  8937 History   First MD Initiated Contact with Patient 03/29/15 [phone]     Chief Complaint  Patient presents with  . Chest Pain     (Consider location/radiation/quality/duration/timing/severity/associated sxs/prior Treatment) HPI [name] is a 49 year-old female with past medical history of asthma who presents the ER complaining of shortness of breath, chest discomfort. Patient states her past 2 weeks to months she has been experiencing dyspnea on exertion. Patient reports having similar signs and symptoms in the past associated with asthma exacerbations. Patient reports associated fatigue, worse in the morning. Patient states this morning when coming to work she began experiencing a tightness in her chest, centrally. She states she has also experienced this tightness in her chest with asthma exacerbations in the past. She states the tightness and shortness of breath concerned her and she decided to check in. Patient describes her chest tightness has been constant, worse with inspiration. Patient reports some chronic issues with leg swelling, worse over the past several weeks also. She states over the past few weeks she has noticed some dependent ankle swelling, which is worse throughout the day. Patient states she is on her feet for most the day, the swelling decreases after lying flat and sleeping. Patient states she's also recently begun estradiol therapy. Patient denies any weakness, headache, blurred vision, dizziness, palpitations, nausea, vomiting, diaphoresis.  Past Medical History  Diagnosis Date  . Migraines   . Asthma   . Anemia   . Anxiety   . Depression   . PONV (postoperative nausea and vomiting)   . Dysrhythmia     Irregular heat w/ asthma episodes and anxiety  . Sleep apnea     uses CPAP  . GERD (gastroesophageal reflux disease)   . Urinary incontinence     frequent urination- tx w/ vesicare   Past Surgical History   Procedure Laterality Date  . Ablation    . Cesarean section      x3  . Mab      x 4  . Dilation and curettage of uterus  05/31/2005    MAB, TAB x 2  . Tubal ligation  11/01/2006  . Leep    . Hernia repair  1990's  . Wisdom tooth extraction    . Mouth surgery      teeth extraction  . Hysteroscopy w/ endometrial ablation     Family History  Problem Relation Age of Onset  . Diabetes Maternal Grandmother   . Anesthesia problems Neg Hx    History  Substance Use Topics  . Smoking status: Former Smoker -- 0.25 packs/day for 15 years    Types: Cigarettes    Quit date: 02/16/2003  . Smokeless tobacco: Never Used  . Alcohol Use: Yes     Comment: socially   OB History    Gravida Para Term Preterm AB TAB SAB Ectopic Multiple Living   15 8 8  0 7 1 2   4      Review of Systems  Constitutional: Negative for fever.  HENT: Negative for trouble swallowing.   Eyes: Negative for visual disturbance.  Respiratory: Positive for chest tightness and shortness of breath.   Cardiovascular: Negative for chest pain.  Gastrointestinal: Negative for nausea, vomiting and abdominal pain.  Genitourinary: Negative for dysuria.  Musculoskeletal: Negative for neck pain.  Skin: Negative for rash.  Neurological: Negative for dizziness, weakness and numbness.  Psychiatric/Behavioral: Negative.       Allergies  Amitriptyline; Fluticasone-salmeterol; Penicillins; Ciprofloxacin; Macrobid; and Sulfamethoxazole-trimethoprim  Home Medications   Prior to Admission medications   Medication Sig Start Date End Date Taking? Authorizing Provider  albuterol (PROVENTIL) (2.5 MG/3ML) 0.083% nebulizer solution Take 3 mLs (2.5 mg total) by nebulization every 4 (four) hours as needed. 12/31/14  Yes Virginia Crews, MD  baclofen (LIORESAL) 10 MG tablet Take 10 mg by mouth 4 (four) times daily as needed (headaches).    Yes Historical Provider, MD  BIOTIN PO Take 2,000 mg by mouth daily.   Yes Historical Provider, MD   budesonide-formoterol (SYMBICORT) 160-4.5 MCG/ACT inhaler Inhale 2 puffs into the lungs 2 (two) times daily. 12/31/14  Yes Virginia Crews, MD  cetirizine (ZYRTEC) 10 MG tablet Take 1 tablet (10 mg total) by mouth daily. 08/10/14  Yes Sharon Mt [address], MD  citalopram (CELEXA) 40 MG tablet Take 40 mg by mouth daily.   Yes Historical Provider, MD  esomeprazole (NEXIUM) 40 MG capsule Take 40 mg by mouth daily.    Yes Historical Provider, MD  estradiol (ESTRACE) 0.1 MG/GM vaginal cream Apply 1 gram per vagina every night for 2 weeks, then apply three times a week 01/14/15  Yes Ugonna A Anyanwu, MD  estrogen, conjugated,-medroxyprogesterone (PREMPRO) 0.3-1.5 MG per tablet Take 1 tablet by mouth daily. 01/19/15  Yes Ugonna A Anyanwu, MD  fluticasone (FLONASE) 50 MCG/ACT nasal spray PLACE 2 SPRAYS INTO THE NOSE DAILY. 04/15/14  Yes Renee A Kuneff, DO  ibuprofen (ADVIL,MOTRIN) 600 MG tablet Take 600 mg by mouth every 6 (six) hours as needed for headache.   Yes Historical Provider, MD  montelukast (SINGULAIR) 10 MG tablet Take 1 tablet (10 mg total) by mouth at bedtime. 08/10/14  Yes Sharon [address], MD  ondansetron (ZOFRAN-ODT) 4 MG disintegrating tablet Take 4 mg by mouth every 8 (eight) hours as needed (headache nausea).   Yes Historical Provider, MD  oxybutynin (DITROPAN-XL) 10 MG 24 hr tablet Take 10 mg by mouth as needed. 06/30/13  Yes Leone Haven, MD  propranolol (INDERAL) 80 MG tablet Take 80 mg by mouth daily as needed (hedaches).    Yes Historical Provider, MD  Pyridoxine HCl (VITAMIN B-6 PO) Take 1 tablet by mouth daily. Unknown OTC strength   Yes Historical Provider, MD  Spacer/Aero-Holding Chambers (AEROCHAMBER PLUS) inhaler Please provide patient with spacer for MDI 12/31/14  Yes Virginia Crews, MD  traZODone (DESYREL) 50 MG tablet Take 50 mg by mouth at bedtime as needed for sleep.   Yes Historical Provider, MD  VENTOLIN HFA 108 (90 BASE) MCG/ACT inhaler Inhale 2 puffs into the  lungs every 4 (four) hours as needed for wheezing or shortness of breath. 12/31/14  Yes Virginia Crews, MD  verapamil (CALAN) 40 MG tablet Take 40 mg by mouth daily. For palpitations/arrhythmias   Yes Historical Provider, MD  vitamin C (ASCORBIC ACID) 500 MG tablet Take 1,000 mg by mouth daily.   Yes Historical Provider, MD  ibuprofen (ADVIL,MOTRIN) 600 MG tablet Take 1 tablet (600 mg total) by mouth 3 (three) times daily. 03/29/15   Dahlia Bailiff, PA-C  predniSONE (DELTASONE) 20 MG tablet Take 1 tablet (20 mg total) by mouth 2 (two) times daily with a meal. 03/29/15   Dahlia Bailiff, PA-C   BP 130/78 mmHg  Pulse 91  Temp(Src) 98.3 F (36.8 C) (Oral)  Resp 16  SpO2 99%  LMP 12/30/2014 (Exact Date) Physical Exam  Constitutional: She is oriented to person, place, and time. She appears well-developed and  well-nourished. No distress.  HENT:  Head: Normocephalic and atraumatic.  Mouth/Throat: Oropharynx is clear and moist. No oropharyngeal exudate.  Eyes: Right eye exhibits no discharge. Left eye exhibits no discharge. No scleral icterus.  Neck: Normal range of motion.  Cardiovascular: Normal rate, regular rhythm and normal heart sounds.   No murmur heard. Pulmonary/Chest: Effort normal. No respiratory distress. She has wheezes in the left middle field.  Mild wheezes noted on left lower.  Abdominal: Soft. There is no tenderness.  Musculoskeletal: Normal range of motion. She exhibits no edema or tenderness.  Neurological: She is alert and oriented to person, place, and time. No cranial nerve deficit. Coordination normal.  Skin: Skin is warm and dry. No rash noted. She is not diaphoretic.  Psychiatric: She has a normal mood and affect.  Nursing note and vitals reviewed.   ED Course  Procedures (including critical care time) Labs Review Labs Reviewed  BASIC METABOLIC PANEL - Abnormal; Notable for the following:    Glucose, Bld 110 (*)    All other components within normal limits  CBC  BRAIN  NATRIURETIC PEPTIDE  D-DIMER, QUANTITATIVE (NOT AT [facility])  Randolm Idol, ED    Imaging Review Dg Chest Port 1 View  03/29/2015   CLINICAL DATA:  Shortness of breath for 2 weeks. Heaviness in chest today. Initial encounter.  EXAM: PORTABLE CHEST - 1 VIEW  COMPARISON:  Radiographs 12/29/2014.  CT 07/28/2014.  FINDINGS: 0724 hr. Overall pulmonary aeration is mildly improved. The heart size and mediastinal contours are normal. The lungs are clear. There is no pleural effusion or pneumothorax. No acute osseous findings are identified. Telemetry leads overlie the chest.  IMPRESSION: No active cardiopulmonary process.   Electronically Signed   By: Richardean Sale M.D.   On: 03/29/2015 07:32     EKG Interpretation   Date/Time:  Tuesday Mar 29 2015 06:56:03 EDT Ventricular Rate:  69 PR Interval:  161 QRS Duration: 77 QT Interval:  411 QTC Calculation: 440 R Axis:   31 Text Interpretation:  Sinus rhythm No significant change since last  tracing Confirmed by Debby Freiberg [id number]) on 03/29/2015 7:37:22 AM      MDM   Final diagnoses:  Bronchitis  Asthma exacerbation  Chest wall pain    Patient here with signs and symptoms consistent with asthma exacerbation versus bronchitis. Patient's lung exam has mild wheezes initially, this improves after DuoNeb treatment. After prednisone and DuoNeb, patient stating her symptoms of mostly subsided.  Based on several weeks of worsening symptoms, pleuritic nature of chest pain and patient's description of her signs and symptoms being consistent with previous asthma exacerbations, there is low suspicion for ACS. Likely musculoskeletal chest wall pain. Chest x-ray unremarkable for acute pathology. EKG unremarkable for acute pathology, troponin negative. Clinically there is alternative diagnosis to PE, and patient is non-tachypnea, non-hypoxic, non-tachycardic, however with patient recently being placed on a region therapy as well as having some subjective  leg swelling which was not appreciated on exam, we'll follow d-dimer.  D-dimer less than 0.27. This is suggestive of a high negative predictive value of PE, unlikely patient experiencing acute bronchitis versus asthma exacerbation. Patient ambulates in the hall with oxygen saturation maintained above 97% room air. Patient asymptomatic, afebrile, hemodynamically stable and in no acute distress. Patient discharged to home to follow up with PCP within the next week. Patient placed on short course of prednisone, return precautions discussed, patient verbalizes understanding and agreement with this plan.  BP 130/78 mmHg  Pulse 91  Temp(Src) 98.3 F (36.8 C) (Oral)  Resp 16  SpO2 99%  LMP 12/30/2014 (Exact Date)  Signed,  Dahlia Bailiff, PA-C 1:43 PM  Patient seen and discussed with Dr. Debby Freiberg, MD  Dahlia Bailiff, PA-C 03/29/15 1343  Debby Freiberg, MD 04/02/15 [phone]

## 2015-03-29 NOTE — Discharge Instructions (Signed)
Asthma, Acute Bronchospasm °Acute bronchospasm caused by asthma is also referred to as an asthma attack. Bronchospasm means your air passages become narrowed. The narrowing is caused by inflammation and tightening of the muscles in the air tubes (bronchi) in your lungs. This can make it hard to breathe or cause you to wheeze and cough. °CAUSES °Possible triggers are: °· Animal dander from the skin, hair, or feathers of animals. °· Dust mites contained in house dust. °· Cockroaches. °· Pollen from trees or grass. °· Mold. °· Cigarette or tobacco smoke. °· Air pollutants such as dust, household cleaners, hair sprays, aerosol sprays, paint fumes, strong chemicals, or strong odors. °· Cold air or weather changes. Cold air may trigger inflammation. Winds increase molds and pollens in the air. °· Strong emotions such as crying or laughing hard. °· Stress. °· Certain medicines such as aspirin or beta-blockers. °· Sulfites in foods and drinks, such as dried fruits and wine. °· Infections or inflammatory conditions, such as a flu, cold, or inflammation of the nasal membranes (rhinitis). °· Gastroesophageal reflux disease (GERD). GERD is a condition where stomach acid backs up into your esophagus. °· Exercise or strenuous activity. °SIGNS AND SYMPTOMS  °· Wheezing. °· Excessive coughing, particularly at night. °· Chest tightness. °· Shortness of breath. °DIAGNOSIS  °Your health care provider will ask you about your medical history and perform a physical exam. A chest X-ray or blood testing may be performed to look for other causes of your symptoms or other conditions that may have triggered your asthma attack.  °TREATMENT  °Treatment is aimed at reducing inflammation and opening up the airways in your lungs.  Most asthma attacks are treated with inhaled medicines. These include quick relief or rescue medicines (such as bronchodilators) and controller medicines (such as inhaled corticosteroids). These medicines are sometimes  given through an inhaler or a nebulizer. Systemic steroid medicine taken by mouth or given through an IV tube also can be used to reduce the inflammation when an attack is moderate or severe. Antibiotic medicines are only used if a bacterial infection is present.  °HOME CARE INSTRUCTIONS  °· Rest. °· Drink plenty of liquids. This helps the mucus to remain thin and be easily coughed up. Only use caffeine in moderation and do not use alcohol until you have recovered from your illness. °· Do not smoke. Avoid being exposed to secondhand smoke. °· You play a critical role in keeping yourself in good health. Avoid exposure to things that cause you to wheeze or to have breathing problems. °· Keep your medicines up-to-date and available. Carefully follow your health care provider's treatment plan. °· Take your medicine exactly as prescribed. °· When pollen or pollution is bad, keep windows closed and use an air conditioner or go to places with air conditioning. °· Asthma requires careful medical care. See your health care provider for a follow-up as advised. If you are more than [redacted] weeks pregnant and you were prescribed any new medicines, let your obstetrician know about the visit and how you are doing. Follow up with your health care provider as directed. °· After you have recovered from your asthma attack, make an appointment with your outpatient doctor to talk about ways to reduce the likelihood of future attacks. If you do not have a doctor who manages your asthma, make an appointment with a primary care doctor to discuss your asthma. °SEEK IMMEDIATE MEDICAL CARE IF:  °· You are getting worse. °· You have trouble breathing. If severe, call your local   emergency services (911 in the [place]  You develop chest pain or discomfort.  You are vomiting.  You are not able to keep fluids down.  You are coughing up yellow, green, brown, or bloody sputum.  You have a fever and your symptoms suddenly get worse.  You have  trouble swallowing. MAKE SURE YOU:   Understand these instructions.  Will watch your condition.  Will get help right away if you are not doing well or get worse. Document Released: 01/30/2007 Document Revised: 10/20/2013 Document Reviewed: 04/22/2013 [facility] Patient Information 2015 [place], [place]. This information is not intended to replace advice given to you by your health care provider. Make sure you discuss any questions you have with your health care provider.  Acute Bronchitis Bronchitis is inflammation of the airways that extend from the windpipe into the lungs (bronchi). The inflammation often causes mucus to develop. This leads to a cough, which is the most common symptom of bronchitis.  In acute bronchitis, the condition usually develops suddenly and goes away over time, usually in a couple weeks. Smoking, allergies, and asthma can make bronchitis worse. Repeated episodes of bronchitis may cause further lung problems.  CAUSES Acute bronchitis is most often caused by the same virus that causes a cold. The virus can spread from person to person (contagious) through coughing, sneezing, and touching contaminated objects. SIGNS AND SYMPTOMS   Cough.   Fever.   Coughing up mucus.   Body aches.   Chest congestion.   Chills.   Shortness of breath.   Sore throat.  DIAGNOSIS  Acute bronchitis is usually diagnosed through a physical exam. Your health care provider will also ask you questions about your medical history. Tests, such as chest X-rays, are sometimes done to rule out other conditions.  TREATMENT  Acute bronchitis usually goes away in a couple weeks. Oftentimes, no medical treatment is necessary. Medicines are sometimes given for relief of fever or cough. Antibiotic medicines are usually not needed but may be prescribed in certain situations. In some cases, an inhaler may be recommended to help reduce shortness of breath and control the cough. A cool mist  vaporizer may also be used to help thin bronchial secretions and make it easier to clear the chest.  HOME CARE INSTRUCTIONS  Get plenty of rest.   Drink enough fluids to keep your urine clear or pale yellow (unless you have a medical condition that requires fluid restriction). Increasing fluids may help thin your respiratory secretions (sputum) and reduce chest congestion, and it will prevent dehydration.   Take medicines only as directed by your health care provider.  If you were prescribed an antibiotic medicine, finish it all even if you start to feel better.  Avoid smoking and secondhand smoke. Exposure to cigarette smoke or irritating chemicals will make bronchitis worse. If you are a smoker, consider using nicotine gum or skin patches to help control withdrawal symptoms. Quitting smoking will help your lungs heal faster.   Reduce the chances of another bout of acute bronchitis by washing your hands frequently, avoiding people with cold symptoms, and trying not to touch your hands to your mouth, nose, or eyes.   Keep all follow-up visits as directed by your health care provider.  SEEK MEDICAL CARE IF: Your symptoms do not improve after 1 week of treatment.  SEEK IMMEDIATE MEDICAL CARE IF:  You develop an increased fever or chills.   You have chest pain.   You have severe shortness of breath.  You have  bloody sputum.   You develop dehydration.  You faint or repeatedly feel like you are going to pass out.  You develop repeated vomiting.  You develop a severe headache. MAKE SURE YOU:   Understand these instructions.  Will watch your condition.  Will get help right away if you are not doing well or get worse. Document Released: 11/22/2004 Document Revised: 03/01/2014 Document Reviewed: 04/07/2013 [facility] Patient Information 2015 [place], [place]. This information is not intended to replace advice given to you by your health care provider. Make sure you discuss any  questions you have with your health care provider.

## 2015-03-31 ENCOUNTER — Encounter: Payer: Self-pay | Admitting: Family Medicine

## 2015-03-31 ENCOUNTER — Ambulatory Visit (INDEPENDENT_AMBULATORY_CARE_PROVIDER_SITE_OTHER): Payer: 59 | Admitting: Family Medicine

## 2015-03-31 VITALS — BP 144/77 | HR 81 | Temp 98.3°F | Ht 61.0 in | Wt 197.2 lb

## 2015-03-31 DIAGNOSIS — J4531 Mild persistent asthma with (acute) exacerbation: Secondary | ICD-10-CM | POA: Diagnosis not present

## 2015-03-31 MED ORDER — MOMETASONE FURO-FORMOTEROL FUM 200-5 MCG/ACT IN AERO: 2.0000 | INHALATION_SPRAY | Freq: Two times a day (BID) | RESPIRATORY_TRACT | Status: DC

## 2015-03-31 NOTE — Patient Instructions (Signed)
Stop the Symbicort.   Start the Elizabeth.  Continue the remainder of your meds.  Follow up closely with your primary.  Take care  Dr. Lacinda Axon

## 2015-04-04 DIAGNOSIS — J45901 Unspecified asthma with (acute) exacerbation: Secondary | ICD-10-CM | POA: Insufficient documentation

## 2015-04-04 NOTE — Assessment & Plan Note (Signed)
Patient is to continue her prednisone course. Switching from Symbicort to [facility]. Patient is to continue albuterol PRN and follow closely with her primary. Return precautions given.

## 2015-04-04 NOTE — Progress Notes (Signed)
   Subjective:    Patient ID: [name], female    DOB: [birth year], 49 y.o.   MRN: [id number]  HPI 49 year old female with a history of asthma presents for ED follow-up. She continues to have complaints of shortness of breath and wheezing.  Patient was seen in the ED on 5/31. She was treated for an asthma exacerbation was started on prednisone 40 mg daily.  She presents today for follow-up. She reports she continues to have shortness of breath particularly with exertion (began on last Thursday).  Unclear inciting factor. She is around sick individuals as she works in [employer].  No reported fevers or chills. No reports of PND but she does endorse that she requires several pillows at night (this has not changed).  She endorses compliance with her home Symbicort. She's been using albuterol 2 times a day with some relief.    Social Hx - Former Smoker.  Review of Systems Per HPI    Objective:   Physical Exam Filed Vitals:   03/31/15 1623  BP: 144/77  Pulse: 81  Temp: 98.3 F (36.8 C)   Vital signs reviewed.  Exam: General: well appearing, NAD. Cardiovascular: RRR. No murmurs, rubs, or gallops. Respiratory: CTAB. No rales, rhonchi, or wheeze. Abdomen: soft, nontender, nondistended. Extremities: warm, well perfused. No LE edema.    Assessment & Plan:  See problem list

## 2015-04-22 ENCOUNTER — Telehealth: Payer: Self-pay

## 2015-04-28 ENCOUNTER — Ambulatory Visit (INDEPENDENT_AMBULATORY_CARE_PROVIDER_SITE_OTHER): Payer: 59 | Admitting: Family Medicine

## 2015-04-28 ENCOUNTER — Encounter: Payer: Self-pay | Admitting: Family Medicine

## 2015-04-28 VITALS — BP 163/96 | HR 75 | Temp 98.3°F | Wt 192.0 lb

## 2015-04-28 DIAGNOSIS — J4550 Severe persistent asthma, uncomplicated: Secondary | ICD-10-CM

## 2015-04-28 MED ORDER — MOMETASONE FURO-FORMOTEROL FUM 200-5 MCG/ACT IN AERO: 2.0000 | INHALATION_SPRAY | Freq: Two times a day (BID) | RESPIRATORY_TRACT | Status: DC

## 2015-04-28 NOTE — Patient Instructions (Addendum)
Thank you for coming to the clinic today. It was nice seeing you.  For your asthma, we will start a new inhaler today called Dulera. Please stop taking the symbicort. We will give you a sample of Dulera. Please make sure you follow up with the pulmonologist.

## 2015-04-28 NOTE — Assessment & Plan Note (Signed)
A: Shortness of breath likely due to severe persistent asthma. Patient may have component of COPD given 30 pack year history of smoking. Does not appear to have current exacerbation. NWOB and stable vitals during today's visit.   Must consider cardiac etiologies as well given patient's history of orthopnea and mild LE edema, however patient has no obvious risk factors for CHF, no JVD, negative hepatjuglar reflux, no no crackles on exam. Symptoms more likely due to pulmonary etiology, however will continue to monitor closely. No evidence for PE, though patient is taking estrogen - must consider if symptoms do not improve.    P: Will stop symbicort and start dulera. Patient will be following up with pulmonology in 2 weeks. Could consider PFT or starting anticholinergic therapy.

## 2015-04-28 NOTE — Progress Notes (Signed)
    Subjective:  [name] is a 49 y.o. female who presents to the [facility] today with a chief complaint of shortness of breath.   HPI:  Shortness of Breath Patient with longstanding history of asthma and an approximately 30 pack year history of smoking (stopped 12 years ago). Was diagnosed with an asthma exacerbation last month and started on a course of steroids. Stated that the steroids helped initially, however she eventually has developed more shortness of breath over the past 2 weeks. Now states that she feels tired when walking. States that she feels more short of breath if she lays flat.   Endorses good compliance with inhalers and nebulizers. Has been using allergy medications. Uses CPAP every night.  Has some a mild productive cough. Sputum is thick and yellow. Also says that she has had some aches in her chest from the cough. Endorses some wheezing when not taking breathing treatment. No fevers or chills.  Had her peak flow checked yesterday and was in 200 range. Is scheduled to see a pulmonologist in 2 weeks.  ROS: Some nausea and vomiting this morning, otherwise all systems reviewed and are negative.    Objective:  Physical Exam: BP 163/96 mmHg  Pulse 75  Temp(Src) 98.3 F (36.8 C) (Oral)  Wt 192 lb (87.091 kg)  SpO2 96%  LMP 12/30/2014 (Exact Date)  Gen: NAD, resting comfortably CV: RRR with no murmurs appreciated, no JVD appreciated Lungs: NWOB, Diminished breath sounds bilaterally. No wheezes or crackles appreciated.  GI: Normal bowel sounds present. Soft, Nontender, Nondistended. Negative hepatojugular reflux MSK: trace edema to mid shin bilaterally, cyanosis, or clubbing noted. Calves symmetric bilaterally. Skin: warm, dry Neuro: grossly normal, moves all extremities Psych: Normal affect and thought content   Assessment/Plan:  Asthma A: Shortness of breath likely due to severe persistent asthma. Patient may have component of COPD given 30 pack year history of  smoking. Does not appear to have current exacerbation. NWOB and stable vitals during today's visit.   Must consider cardiac etiologies as well given patient's history of orthopnea and mild LE edema, however patient has no obvious risk factors for CHF, no JVD, negative hepatjuglar reflux, no no crackles on exam. Symptoms more likely due to pulmonary etiology, however will continue to monitor closely. No evidence for PE, though patient is taking estrogen - must consider if symptoms do not improve.    P: Will stop symbicort and start dulera. Patient will be following up with pulmonology in 2 weeks. Could consider PFT or starting anticholinergic therapy.    [name]. Jerline Pain, Gail Resident PGY-1 04/28/2015 4:00 PM

## 2015-05-09 ENCOUNTER — Encounter: Payer: Self-pay | Admitting: Internal Medicine

## 2015-05-09 ENCOUNTER — Ambulatory Visit (INDEPENDENT_AMBULATORY_CARE_PROVIDER_SITE_OTHER): Payer: 59 | Admitting: Internal Medicine

## 2015-05-09 VITALS — BP 138/76 | HR 83 | Temp 100.1°F | Ht 61.0 in | Wt 197.0 lb

## 2015-05-09 DIAGNOSIS — J4550 Severe persistent asthma, uncomplicated: Secondary | ICD-10-CM | POA: Diagnosis not present

## 2015-05-09 MED ORDER — ALBUTEROL SULFATE HFA 108 (90 BASE) MCG/ACT IN AERS: INHALATION_SPRAY | RESPIRATORY_TRACT | Status: DC

## 2015-05-09 MED ORDER — AEROCHAMBER MV MISC: Status: AC

## 2015-05-09 MED ORDER — TIOTROPIUM BROMIDE MONOHYDRATE 2.5 MCG/ACT IN AERS: 2.0000 | INHALATION_SPRAY | Freq: Every day | RESPIRATORY_TRACT | Status: DC

## 2015-05-09 NOTE — Assessment & Plan Note (Signed)
Patient with known history of asthma since 2004 have to recurrent bronchitis infections. Her asthma is currently on control. Triggers at this time could be weather changes and allergies. I suspect that prolonged smoking of history combination with asthma-also showing up as clinically COPD. Differential diagnosis at this time for her current symptoms his asthma in combination with COPD. Further history reveals that she has moderate relief with oral prednisone and with Spiriva. These findings especially with Spiriva can be seen and COPD patient's.  Plan: -Pulmonary function testing and 6 minute walk test prior to follow-up. -Spiriva Respimat 2.5 g, 2 puffs in the morning gargle and rinse after each use -Continue with [facility] -Discussed importance of rescue medication and appropriate usefulness of it, and Spiriva use continues patient is advised to wean down on her use of rescue inhalers and nebulizers. -Prescription for AeroChamber also given to using inhalers

## 2015-05-09 NOTE — Patient Instructions (Signed)
Follow up with Dr. Stevenson Clinch in 1 month - pulmonary function testing and 6 minute walk test prior to follow up - cont with dulera, -gargle and rinse after each use.  - Spiriva Respimat 2.43mcg - 2 puffs daily, -gargle and rinse after each use.  - albuterol inhaler - 2puff every 3-4 hours as needed for shortness of breath\wheezing\recurrent cough

## 2015-05-09 NOTE — Progress Notes (Signed)
Date: 05/09/2015  MRN# [id number] [name] Jan 15, 1966  Referring Physician:   [name] is a 49 y.o. old female seen in consultation for asthma optimization  CC:  Chief Complaint  Patient presents with  . Advice Only    Consult Asthma    HPI:  49 year old female seen in consultation today for asthma/COPD optimization. Patient states she was diagnosed with asthma in 2004 after having recurrent bronchitis and pneumonias at that time, walking pneumonia. She has spirometry done that showed obstructive airway disease and was placed on, patient of several different inhalers over the past couple of years which include Spiriva, Serevent, Flovent, albuterol, Symbicort; 2 weeks ago she had another asthma flareup was placed on steroids and Symbicort was changed to [facility].  She states that about 12 years ago she started having symptoms of recurrent cough, recurrent upper respiratory tract infections, bronchospasms, nasal congestion, chest congestion and at that time it was determined that she was having symptoms consistent with asthma; she was placed on inhalers which greatly improved her symptoms at that time. About 4 months ago she started to have similar problems again and for this year has had 3 visits to the ED each time was placed on prednisone taper and one time on a Z-Pak. She states that she is using her albuterol inhaler daily twice a day, and her albuterol nebulizer a few nights per week. Patient also has a history of obstructive sleep apnea diagnosed 3 years ago, wearing his CPAP every night, she is followed by her primary care physician for sleep apnea. Serevent and Flovent in the past she had worsening cough, and has since been taken off these meds. Patient states since starting on Dulera her symptoms have improved but she still endorses some dyspnea on exertion, and fatigue and still with use of albuterol rescue inhaler. In the past she was using Spiriva with great control of asthma,  but her physicians took her off and change her to other inhalers. Patient does have a history of prior tobacco use quit in 2004, previously smoked half a pack to one pack per day for 23 years Patient states she also has a history of allergies, and is currently on Zyrtec, Singulair, Flonase, which she uses on a regular basis. Patient lives in a home which is about 48 years old, she has no pets, no exposure to mold and mildew. She she works as a Designer, multimedia at [employer].    PMHX:   Past Medical History  Diagnosis Date  . Migraines   . Asthma   . Anemia   . Anxiety   . Depression   . PONV (postoperative nausea and vomiting)   . Dysrhythmia     Irregular heat w/ asthma episodes and anxiety  . Sleep apnea     uses CPAP  . GERD (gastroesophageal reflux disease)   . Urinary incontinence     frequent urination- tx w/ vesicare   Surgical Hx:  Past Surgical History  Procedure Laterality Date  . Ablation    . Cesarean section      x3  . Mab      x 4  . Dilation and curettage of uterus  05/31/2005    MAB, TAB x 2  . Tubal ligation  11/01/2006  . Leep    . Hernia repair  1990's  . Wisdom tooth extraction    . Mouth surgery      teeth extraction  . Hysteroscopy w/ endometrial ablation  Family Hx:  Family History  Problem Relation Age of Onset  . Diabetes Maternal Grandmother   . Anesthesia problems Neg Hx    Social Hx:   History  Substance Use Topics  . Smoking status: Former Smoker -- 0.25 packs/day for 15 years    Types: Cigarettes    Quit date: 02/16/2003  . Smokeless tobacco: Never Used  . Alcohol Use: Yes     Comment: socially   Medication:   Current Outpatient Rx  Name  Route  Sig  Dispense  Refill  . albuterol (PROVENTIL) (2.5 MG/3ML) 0.083% nebulizer solution   Nebulization   Take 3 mLs (2.5 mg total) by nebulization every 4 (four) hours as needed.   75 mL   6   . baclofen (LIORESAL) 10 MG tablet   Oral   Take 10 mg by mouth 4 (four) times daily as needed  (headaches).          Marland Kitchen BIOTIN PO   Oral   Take 2,000 mg by mouth daily.         . cetirizine (ZYRTEC) 10 MG tablet   Oral   Take 1 tablet (10 mg total) by mouth daily.   30 tablet   11   . citalopram (CELEXA) 40 MG tablet   Oral   Take 40 mg by mouth daily.         Marland Kitchen esomeprazole (NEXIUM) 40 MG capsule   Oral   Take 40 mg by mouth daily.          Marland Kitchen estradiol (ESTRACE) 0.1 MG/GM vaginal cream      Apply 1 gram per vagina every night for 2 weeks, then apply three times a week   30 g   12   . estrogen, conjugated,-medroxyprogesterone (PREMPRO) 0.3-1.5 MG per tablet   Oral   Take 1 tablet by mouth daily.   30 tablet   12   . fluticasone (FLONASE) 50 MCG/ACT nasal spray      PLACE 2 SPRAYS INTO THE NOSE DAILY.   16 g   6   . ibuprofen (ADVIL,MOTRIN) 600 MG tablet   Oral   Take 1 tablet (600 mg total) by mouth 3 (three) times daily.   20 tablet   0   . mometasone-formoterol (DULERA) 200-5 MCG/ACT AERO   Inhalation   Inhale 2 puffs into the lungs 2 (two) times daily.   8.8 g   0   . montelukast (SINGULAIR) 10 MG tablet   Oral   Take 1 tablet (10 mg total) by mouth at bedtime.   90 tablet   3   . oxybutynin (DITROPAN-XL) 10 MG 24 hr tablet   Oral   Take 10 mg by mouth as needed.         . predniSONE (DELTASONE) 20 MG tablet   Oral   Take 1 tablet (20 mg total) by mouth 2 (two) times daily with a meal.   10 tablet   0   . propranolol (INDERAL) 80 MG tablet   Oral   Take 80 mg by mouth daily as needed (hedaches).          . Pyridoxine HCl (VITAMIN B-6 PO)   Oral   Take 1 tablet by mouth daily. Unknown OTC strength         . Spacer/Aero-Holding Chambers (AEROCHAMBER PLUS) inhaler      Please provide patient with spacer for MDI   1 each   0   . traZODone (DESYREL)  50 MG tablet   Oral   Take 50 mg by mouth at bedtime as needed for sleep.         . VENTOLIN HFA 108 (90 BASE) MCG/ACT inhaler   Inhalation   Inhale 2 puffs into the  lungs every 4 (four) hours as needed for wheezing or shortness of breath.   18 g   3     Dispense as written.   . verapamil (CALAN) 40 MG tablet   Oral   Take 40 mg by mouth daily. For palpitations/arrhythmias         . vitamin C (ASCORBIC ACID) 500 MG tablet   Oral   Take 1,000 mg by mouth daily.             Allergies:  Amitriptyline; Fluticasone-salmeterol; Penicillins; Ciprofloxacin; Macrobid; and Sulfamethoxazole-trimethoprim  Review of Systems: Gen:  Denies  fever, sweats, chills HEENT: Denies blurred vision, double vision, ear pain, eye pain, hearing loss, nose bleeds, sore throat Cvc:  No dizziness, chest pain or heaviness Resp:   Dyspnea on exertion, mild cough, short of breath, intermittent wheezing Gi: Denies swallowing difficulty, stomach pain, nausea or vomiting, diarrhea, constipation, bowel incontinence Gu:  Denies bladder incontinence, burning urine Ext:   No Joint pain, stiffness or swelling Skin: No skin rash, easy bruising or bleeding or hives Endoc:  No polyuria, polydipsia , polyphagia or weight change Psych: No depression, insomnia or hallucinations  Other:  All other systems negative  Physical Examination:   VS: BP 138/76 mmHg  Pulse 83  Temp(Src) 100.1 F (37.8 C) (Oral)  Ht 5\' 1"  (1.549 m)  Wt 197 lb (89.359 kg)  BMI 37.24 kg/m2  SpO2 98%  LMP 12/30/2014 (Exact Date)  General Appearance: No distress  Neuro:without focal findings, mental status, speech normal, alert and oriented, cranial nerves 2-12 intact, reflexes normal and symmetric, sensation grossly normal  HEENT: PERRLA, EOM intact, no ptosis, no other lesions noticed; Mallampati 2 Pulmonary: normal breath sounds., diaphragmatic excursion normal.No wheezing, No rales;   Sputum Production:   CardiovascularNormal S1,S2.  No m/r/g.  Abdominal aorta pulsation normal.    Abdomen: Benign, Soft, non-tender, No masses, hepatosplenomegaly, No lymphadenopathy Renal:  No costovertebral  tenderness  GU:  No performed at this time. Endoc: No evident thyromegaly, no signs of acromegaly or Cushing features Skin:   warm, no rashes, no ecchymosis  Extremities: normal, no cyanosis, clubbing, no edema, warm with normal capillary refill. Other findings: None   Labs results: (The following images and results were reviewed by Dr. Stevenson Clinch). CXR 02/2015 EXAM: PORTABLE CHEST - 1 VIEW  COMPARISON: Radiographs 12/29/2014. CT 07/28/2014.  FINDINGS: 0724 hr. Overall pulmonary aeration is mildly improved. The heart size and mediastinal contours are normal. The lungs are clear. There is no pleural effusion or pneumothorax. No acute osseous findings are identified. Telemetry leads overlie the chest.  IMPRESSION: No active cardiopulmonary process.  Assessment and Plan: 49 year old female seen in consultation for asthma optimization Asthma Patient with known history of asthma since 2004 have to recurrent bronchitis infections. Her asthma is currently on control. Triggers at this time could be weather changes and allergies. I suspect that prolonged smoking of history combination with asthma-also showing up as clinically COPD. Differential diagnosis at this time for her current symptoms his asthma in combination with COPD. Further history reveals that she has moderate relief with oral prednisone and with Spiriva. These findings especially with Spiriva can be seen and COPD patient's.  Plan: -Pulmonary function testing and 6  minute walk test prior to follow-up. -Spiriva Respimat 2.5 g, 2 puffs in the morning gargle and rinse after each use -Continue with [facility] -Discussed importance of rescue medication and appropriate usefulness of it, and Spiriva use continues patient is advised to wean down on her use of rescue inhalers and nebulizers. -Prescription for AeroChamber also given to using inhalers    Updated Medication List Outpatient Encounter Prescriptions as of 05/09/2015   Medication Sig  . albuterol (PROVENTIL) (2.5 MG/3ML) 0.083% nebulizer solution Take 3 mLs (2.5 mg total) by nebulization every 4 (four) hours as needed.  . baclofen (LIORESAL) 10 MG tablet Take 10 mg by mouth 4 (four) times daily as needed (headaches).   Marland Kitchen BIOTIN PO Take 2,000 mg by mouth daily.  . cetirizine (ZYRTEC) 10 MG tablet Take 1 tablet (10 mg total) by mouth daily.  . citalopram (CELEXA) 40 MG tablet Take 40 mg by mouth daily.  Marland Kitchen esomeprazole (NEXIUM) 40 MG capsule Take 40 mg by mouth daily.   Marland Kitchen estradiol (ESTRACE) 0.1 MG/GM vaginal cream Apply 1 gram per vagina every night for 2 weeks, then apply three times a week  . estrogen, conjugated,-medroxyprogesterone (PREMPRO) 0.3-1.5 MG per tablet Take 1 tablet by mouth daily.  . fluticasone (FLONASE) 50 MCG/ACT nasal spray PLACE 2 SPRAYS INTO THE NOSE DAILY.  Marland Kitchen ibuprofen (ADVIL,MOTRIN) 600 MG tablet Take 1 tablet (600 mg total) by mouth 3 (three) times daily.  . mometasone-formoterol (DULERA) 200-5 MCG/ACT AERO Inhale 2 puffs into the lungs 2 (two) times daily.  . montelukast (SINGULAIR) 10 MG tablet Take 1 tablet (10 mg total) by mouth at bedtime.  Marland Kitchen oxybutynin (DITROPAN-XL) 10 MG 24 hr tablet Take 10 mg by mouth as needed.  . predniSONE (DELTASONE) 20 MG tablet Take 1 tablet (20 mg total) by mouth 2 (two) times daily with a meal.  . propranolol (INDERAL) 80 MG tablet Take 80 mg by mouth daily as needed (hedaches).   . Pyridoxine HCl (VITAMIN B-6 PO) Take 1 tablet by mouth daily. Unknown OTC strength  . Spacer/Aero-Holding Chambers (AEROCHAMBER PLUS) inhaler Please provide patient with spacer for MDI  . traZODone (DESYREL) 50 MG tablet Take 50 mg by mouth at bedtime as needed for sleep.  . VENTOLIN HFA 108 (90 BASE) MCG/ACT inhaler Inhale 2 puffs into the lungs every 4 (four) hours as needed for wheezing or shortness of breath.  . verapamil (CALAN) 40 MG tablet Take 40 mg by mouth daily. For palpitations/arrhythmias  . vitamin C (ASCORBIC  ACID) 500 MG tablet Take 1,000 mg by mouth daily.  Marland Kitchen albuterol (PROVENTIL HFA;VENTOLIN HFA) 108 (90 BASE) MCG/ACT inhaler 2 puffs every 3-4 hours as needed for shortness of breath\wheezing\recurrent cough  . Spacer/Aero-Holding Chambers (AEROCHAMBER MV) inhaler Use as instructed  . Tiotropium Bromide Monohydrate (SPIRIVA RESPIMAT) 2.5 MCG/ACT AERS Inhale 2 puffs into the lungs daily.  . Tiotropium Bromide Monohydrate (SPIRIVA RESPIMAT) 2.5 MCG/ACT AERS Inhale 2 puffs into the lungs daily.   No facility-administered encounter medications on file as of 05/09/2015.    Orders for this visit: No orders of the defined types were placed in this encounter.     Thank  you for the consultation and for allowing [facility] to assist in the care of your patient. Our recommendations are noted above.  Please contact us if we can be of further service.   Vilinda Boehringer, MD [facility] Office Number: [phone]

## 2015-05-11 ENCOUNTER — Other Ambulatory Visit: Payer: Self-pay | Admitting: Family Medicine

## 2015-05-30 ENCOUNTER — Telehealth: Payer: Self-pay | Admitting: *Deleted

## 2015-05-30 DIAGNOSIS — J45909 Unspecified asthma, uncomplicated: Secondary | ICD-10-CM

## 2015-05-30 NOTE — Telephone Encounter (Signed)
PFT order placed

## 2015-05-31 ENCOUNTER — Encounter: Payer: Self-pay | Admitting: Family Medicine

## 2015-05-31 ENCOUNTER — Ambulatory Visit (INDEPENDENT_AMBULATORY_CARE_PROVIDER_SITE_OTHER): Payer: 59 | Admitting: Family Medicine

## 2015-05-31 VITALS — BP 148/104 | HR 65 | Temp 98.5°F | Wt 197.0 lb

## 2015-05-31 DIAGNOSIS — S76311A Strain of muscle, fascia and tendon of the posterior muscle group at thigh level, right thigh, initial encounter: Secondary | ICD-10-CM | POA: Diagnosis not present

## 2015-05-31 DIAGNOSIS — S76011A Strain of muscle, fascia and tendon of right hip, initial encounter: Secondary | ICD-10-CM

## 2015-05-31 MED ORDER — CYCLOBENZAPRINE HCL 10 MG PO TABS: 5.0000 mg | ORAL_TABLET | Freq: Three times a day (TID) | ORAL | Status: DC | PRN

## 2015-05-31 MED ORDER — IBUPROFEN 600 MG PO TABS: 600.0000 mg | ORAL_TABLET | Freq: Four times a day (QID) | ORAL | Status: DC | PRN

## 2015-05-31 NOTE — Patient Instructions (Signed)
Muscle Strain A muscle strain is an injury that occurs when a muscle is stretched beyond its normal length. Usually a small number of muscle fibers are torn when this happens. Muscle strain is rated in degrees. First-degree strains have the least amount of muscle fiber tearing and pain. Second-degree and third-degree strains have increasingly more tearing and pain.  Usually, recovery from muscle strain takes 1-2 weeks. Complete healing takes 5-6 weeks.  CAUSES  Muscle strain happens when a sudden, violent force placed on a muscle stretches it too far. This may occur with lifting, sports, or a fall.  RISK FACTORS Muscle strain is especially common in athletes.  SIGNS AND SYMPTOMS At the site of the muscle strain, there may be:  Pain.  Bruising.  Swelling.  Difficulty using the muscle due to pain or lack of normal function. DIAGNOSIS  Your health care provider will perform a physical exam and ask about your medical history. TREATMENT  Often, the best treatment for a muscle strain is resting, icing, and applying cold compresses to the injured area.  HOME CARE INSTRUCTIONS   Use the PRICE method of treatment to promote muscle healing during the first 2-3 days after your injury. The PRICE method involves:  Protecting the muscle from being injured again.  Restricting your activity and resting the injured body part.  Icing your injury. To do this, put ice in a plastic bag. Place a towel between your skin and the bag. Then, apply the ice and leave it on from 15-20 minutes each hour. After the third day, switch to moist heat packs.  Apply compression to the injured area with a splint or elastic bandage. Be careful not to wrap it too tightly. This may interfere with blood circulation or increase swelling.  Elevate the injured body part above the level of your heart as often as you can.  Only take over-the-counter or prescription medicines for pain, discomfort, or fever as directed by your  health care provider.  Warming up prior to exercise helps to prevent future muscle strains. SEEK MEDICAL CARE IF:   You have increasing pain or swelling in the injured area.  You have numbness, tingling, or a significant loss of strength in the injured area. MAKE SURE YOU:   Understand these instructions.  Will watch your condition.  Will get help right away if you are not doing well or get worse. Document Released: 10/15/2005 Document Revised: 08/05/2013 Document Reviewed: 05/14/2013 [facility] Patient Information 2015 [place], [place]. This information is not intended to replace advice given to you by your health care provider. Make sure you discuss any questions you have with your health care provider.   Thanks for coming in.  Use ice and heat along with flexeril and ibuprofen for pain / muscle relaxer.   Follow up with your PCP if the pain is not improving over the next week.   Thanks for letting us take care of you.    Sincerely,  Paula Compton, MD Family Medicine - PGY 2

## 2015-06-01 ENCOUNTER — Ambulatory Visit: Payer: Self-pay

## 2015-06-01 NOTE — Progress Notes (Signed)
Patient ID: [name], female   DOB: 1966-02-22, 49 y.o.   MRN: [id number]   [facility] Aquilla Hacker, MD Phone: [phone]  Subjective:   # Right Thigh Pain - Pt. Here with acute onset pain in her right thigh / innner groin after experiencing a cramp "charlie horse" while sleeping last night.  - She says that the cramp woke her from sleep.  - She says that she has not had any knee pain, back pain.  - She has not lifted anything heavy recently.  - the pain radiates along the side of her thigh and along her the hip flexor crease.  - She has not had any nausea, vomiting, dysuria, hematuria, discharge, pelvic or abdominal pain.  - She says she has not had back trouble before.  - She has had hip pain before but not to this degree.  - She says muscle relaxers helped her pain along with ibuprofen / tylenol.  - She has done heating pads, but not ice.  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  - She says her pain is better whenever she gets up and walks around, but worse when she sits still for some time, and also it is worse after stopping walking if she has been walking for some time.  - She has no numbness, tingling, loss of bowel or bladder continence.  - She is able to get up from a seated position.  - She has not noticed any weakness, just pain.   All relevant systems were reviewed and were negative unless otherwise noted in the HPI  Past Medical History Reviewed problem list.  Medications- reviewed and updated Current Outpatient Prescriptions  Medication Sig Dispense Refill  . albuterol (PROVENTIL HFA;VENTOLIN HFA) 108 (90 BASE) MCG/ACT inhaler 2 puffs every 3-4 hours as needed for shortness of breath\wheezing\recurrent cough 1 Inhaler 6  . albuterol (PROVENTIL) (2.5 MG/3ML) 0.083% nebulizer solution Take 3 mLs (2.5 mg total) by nebulization every 4 (four) hours as  needed. 75 mL 6  . baclofen (LIORESAL) 10 MG tablet Take 10 mg by mouth 4 (four) times daily as needed (headaches).     Marland Kitchen BIOTIN PO Take 2,000 mg by mouth daily.    . cetirizine (ZYRTEC) 10 MG tablet Take 1 tablet (10 mg total) by mouth daily. 30 tablet 11  . citalopram (CELEXA) 40 MG tablet Take 40 mg by mouth daily.    . cyclobenzaprine (FLEXERIL) 10 MG tablet Take 0.5 tablets (5 mg total) by mouth 3 (three) times daily as needed for muscle spasms. 30 tablet 0  . esomeprazole (NEXIUM) 40 MG capsule Take 40 mg by mouth daily.     Marland Kitchen estradiol (ESTRACE) 0.1 MG/GM vaginal cream Apply 1 gram per vagina every night for 2 weeks, then apply three times a week 30 g 12  . estrogen, conjugated,-medroxyprogesterone (PREMPRO) 0.3-1.5 MG per tablet Take 1 tablet by mouth daily. 30 tablet 12  . fluticasone (FLONASE) 50 MCG/ACT nasal spray PLACE 2 SPRAYS INTO THE NOSE DAILY. 16 g 6  . ibuprofen (ADVIL,MOTRIN) 600 MG tablet Take 1 tablet (600 mg total) by mouth 3 (three) times daily. 20 tablet 0  . ibuprofen (ADVIL,MOTRIN) 600 MG tablet Take 1 tablet (600 mg total) by mouth every 6 (six) hours as needed. 30 tablet 0  . mometasone-formoterol (DULERA) 200-5 MCG/ACT AERO Inhale 2 puffs into  the lungs 2 (two) times daily. 8.8 g 0  . montelukast (SINGULAIR) 10 MG tablet Take 1 tablet (10 mg total) by mouth at bedtime. 90 tablet 3  . oxybutynin (DITROPAN-XL) 10 MG 24 hr tablet Take 10 mg by mouth as needed.    . predniSONE (DELTASONE) 20 MG tablet Take 1 tablet (20 mg total) by mouth 2 (two) times daily with a meal. 10 tablet 0  . propranolol (INDERAL) 80 MG tablet Take 80 mg by mouth daily as needed (hedaches).     . propranolol ER (INDERAL LA) 80 MG 24 hr capsule TAKE 1 CAPSULE BY MOUTH DAILY. 30 capsule 2  . Pyridoxine HCl (VITAMIN B-6 PO) Take 1 tablet by mouth daily. Unknown OTC strength    . Spacer/Aero-Holding Chambers (AEROCHAMBER MV) inhaler Use as instructed 1 each 0  . Spacer/Aero-Holding Chambers  (AEROCHAMBER PLUS) inhaler Please provide patient with spacer for MDI 1 each 0  . Tiotropium Bromide Monohydrate (SPIRIVA RESPIMAT) 2.5 MCG/ACT AERS Inhale 2 puffs into the lungs daily. 4 g 3  . Tiotropium Bromide Monohydrate (SPIRIVA RESPIMAT) 2.5 MCG/ACT AERS Inhale 2 puffs into the lungs daily. 1 Inhaler 0  . traZODone (DESYREL) 50 MG tablet Take 50 mg by mouth at bedtime as needed for sleep.    . VENTOLIN HFA 108 (90 BASE) MCG/ACT inhaler Inhale 2 puffs into the lungs every 4 (four) hours as needed for wheezing or shortness of breath. 18 g 3  . verapamil (CALAN) 40 MG tablet Take 40 mg by mouth daily. For palpitations/arrhythmias    . vitamin C (ASCORBIC ACID) 500 MG tablet Take 1,000 mg by mouth daily.    . [DISCONTINUED] medroxyPROGESTERone (DEPO-PROVERA) 150 MG/ML injection Inject 1 mL (150 mg total) into the muscle every 3 (three) months. 1 mL 0  . [DISCONTINUED] medroxyPROGESTERone (PROVERA) 10 MG tablet Take 2 tablets (20 mg total) by mouth daily. 60 tablet 2   No current facility-administered medications for this visit.   Chief complaint-noted No additions to family history Social history- patient is a non smoker  Objective: BP 148/104 mmHg  Pulse 65  Temp(Src) 98.5 F (36.9 C) (Oral)  Wt 197 lb (89.359 kg)  LMP 12/30/2014 (Exact Date) Gen: NAD, alert, cooperative with exam HEENT: NCAT, EOMI, PERRL Neck: FROM, supple CV: RRR, good S1/S2, no murmur Resp: CTABL, no wheezes, non-labored Abd: SNTND, BS present, no guarding or organomegaly, no suprapubic pain.  Ext: No edema, warm, normal tone, moves UE/LE spontaneously R Hip: pt. With FROM , positive tenderness radiating into her groin. She has pain with flexion of the hip predominantly, no lateral hip tenderness. No crepitus. She is able to stand from sitting without support, and sit, but these motions are much worse for the pain and she attempts to straighten her right leg out and not use it when sitting or standing.  Back: no  back pain / tenderness.  FROM.  Neuro: Alert and oriented, No gross deficits, full motor and sensory in bilateral extremities.  Skin: no rashes no lesions  Assessment/Plan:   # Iliopsoas Strain - pt. With pain and discomfort along with exam / painful components consistent with what is likely a strain of her psoas muscle. This is improved with flexeril which she has been taking at home along with NSAIDS.  - Advised rest for the next 24 hours.  - Given a prescription for flexeril to help with symptoms - Recommend Ibuprofen 600mg  qid.  - Heat / Ice  - Warm baths.  -  Drink plenty of fluids  - Reassurance given  - Should improve over the next 7 - 10 days.  - Return precautions given.

## 2015-06-02 ENCOUNTER — Ambulatory Visit (INDEPENDENT_AMBULATORY_CARE_PROVIDER_SITE_OTHER): Payer: 59 | Admitting: Internal Medicine

## 2015-06-02 DIAGNOSIS — J4550 Severe persistent asthma, uncomplicated: Secondary | ICD-10-CM

## 2015-06-02 DIAGNOSIS — J45909 Unspecified asthma, uncomplicated: Secondary | ICD-10-CM | POA: Diagnosis not present

## 2015-06-02 LAB — PULMONARY FUNCTION TEST
DL/VA % pred: 105 %
DL/VA: 4.79 ml/min/mmHg/L
DLCO UNC: 15.32 ml/min/mmHg
DLCO unc % pred: 71 %
FEF 25-75 PRE: 3.34 L/s
FEF 25-75 Post: 3.02 L/sec
FEF2575-%Change-Post: -9 %
FEF2575-%PRED-PRE: 140 %
FEF2575-%Pred-Post: 127 %
FEV1-%Change-Post: -2 %
FEV1-%PRED-PRE: 87 %
FEV1-%Pred-Post: 85 %
FEV1-Post: 1.86 L
FEV1-Pre: 1.9 L
FEV1FVC-%Change-Post: 0 %
FEV1FVC-%Pred-Pre: 113 %
FEV6-%CHANGE-POST: -2 %
FEV6-%Pred-Post: 75 %
FEV6-%Pred-Pre: 77 %
FEV6-PRE: 2.05 L
FEV6-Post: 1.99 L
FEV6FVC-%Pred-Post: 103 %
FEV6FVC-%Pred-Pre: 103 %
FVC-%Change-Post: -2 %
FVC-%Pred-Post: 73 %
FVC-%Pred-Pre: 75 %
FVC-POST: 1.99 L
FVC-Pre: 2.05 L
POST FEV1/FVC RATIO: 94 %
Post FEV6/FVC ratio: 100 %
Pre FEV1/FVC ratio: 93 %
Pre FEV6/FVC Ratio: 100 %
RV % pred: 75 %
RV: 1.25 L
TLC % PRED: 75 %
TLC: 3.59 L

## 2015-06-02 NOTE — Progress Notes (Signed)
Patient had SMW done today.

## 2015-06-02 NOTE — Progress Notes (Signed)
PFT performed today. 

## 2015-06-08 ENCOUNTER — Ambulatory Visit: Payer: Self-pay | Admitting: Internal Medicine

## 2015-06-27 ENCOUNTER — Encounter: Payer: Self-pay | Admitting: Internal Medicine

## 2015-06-27 ENCOUNTER — Ambulatory Visit (INDEPENDENT_AMBULATORY_CARE_PROVIDER_SITE_OTHER): Payer: 59 | Admitting: Internal Medicine

## 2015-06-27 ENCOUNTER — Telehealth: Payer: Self-pay | Admitting: Family Medicine

## 2015-06-27 VITALS — BP 132/66 | HR 74 | Ht 61.0 in | Wt 198.0 lb

## 2015-06-27 DIAGNOSIS — J0100 Acute maxillary sinusitis, unspecified: Secondary | ICD-10-CM | POA: Insufficient documentation

## 2015-06-27 DIAGNOSIS — J4550 Severe persistent asthma, uncomplicated: Secondary | ICD-10-CM

## 2015-06-27 DIAGNOSIS — J019 Acute sinusitis, unspecified: Secondary | ICD-10-CM | POA: Diagnosis not present

## 2015-06-27 MED ORDER — LEVOFLOXACIN 500 MG PO TABS: 500.0000 mg | ORAL_TABLET | Freq: Two times a day (BID) | ORAL | Status: AC

## 2015-06-27 MED ORDER — PREDNISONE 20 MG PO TABS
20.0000 mg | ORAL_TABLET | Freq: Every day | ORAL | Status: DC
Start: 2015-06-27 — End: 2015-12-26

## 2015-06-27 NOTE — Assessment & Plan Note (Signed)
Patient with symptoms of sinus congestion, headache, cough productive sputum, I believe at this time she is having an acute sinusitis, maybe triggered by the weather changes and allergies. Plan: -Levaquin 500 mg 1 tab by mouth twice a day 10 days -Prednisone 20 mg, 1 tab by mouth with breakfast 5 days -Nasal saline rinse, use 2-3 times per week while on antibiotics.

## 2015-06-27 NOTE — Progress Notes (Signed)
MRN# [id number] [name] May 19, 1966   CC: Chief Complaint  Patient presents with  . Follow-up    review 69mw, pft, and sleep study.  pt c/o sinus congestion, sob, prod cough with yellow mucus Xseveral days.        Brief History: 49 year old female seen in consultation today for asthma/COPD optimization. Patient states she was diagnosed with asthma in 2004 after having recurrent bronchitis and pneumonias at that time, walking pneumonia. She has spirometry done that showed obstructive airway disease and was placed on, patient of several different inhalers over the past couple of years which include Spiriva, Serevent, Flovent, albuterol, Symbicort; 2 weeks ago she had another asthma flareup was placed on steroids and Symbicort was changed to [facility].  She states that about 12 years ago she started having symptoms of recurrent cough, recurrent upper respiratory tract infections, bronchospasms, nasal congestion, chest congestion and at that time it was determined that she was having symptoms consistent with asthma; she was placed on inhalers which greatly improved her symptoms at that time. About 4 months ago she started to have similar problems again and for this year has had 3 visits to the ED each time was placed on prednisone taper and one time on a Z-Pak. She states that she is using her albuterol inhaler daily twice a day, and her albuterol nebulizer a few nights per week. Patient also has a history of obstructive sleep apnea diagnosed 3 years ago, wearing his CPAP every night, she is followed by her primary care physician for sleep apnea. Serevent and Flovent in the past she had worsening cough, and has since been taken off these meds. Patient states since starting on Dulera her symptoms have improved but she still endorses some dyspnea on exertion, and fatigue and still with use of albuterol rescue inhaler. In the past she was using Spiriva with great control of asthma, but her physicians  took her off and change her to other inhalers. Patient does have a history of prior tobacco use quit in 2004, previously smoked half a pack to one pack per day for 23 years Patient states she also has a history of allergies, and is currently on Zyrtec, Singulair, Flonase, which she uses on a regular basis. Patient lives in a home which is about 48 years old, she has no pets, no exposure to mold and mildew. She she works as a Designer, multimedia at [employer].  Asthma Patient with known history of asthma since 2004 have to recurrent bronchitis infections. Her asthma is currently on control. Triggers at this time could be weather changes and allergies. I suspect that prolonged smoking of history combination with asthma-also showing up as clinically COPD. Differential diagnosis at this time for her current symptoms his asthma in combination with COPD. Further history reveals that she has moderate relief with oral prednisone and with Spiriva. These findings especially with Spiriva can be seen and COPD patient's.  Plan: -Pulmonary function testing and 6 minute walk test prior to follow-up. -Spiriva Respimat 2.5 g, 2 puffs in the morning gargle and rinse after each use -Continue with [facility] -Discussed importance of rescue medication and appropriate usefulness of it, and Spiriva use continues patient is advised to wean down on her use of rescue inhalers and nebulizers. -Prescription for AeroChamber also given to using inhalers Events since last clinic visit:  Patient presents today for a follow-up visit of asthma optimization. Since her last visit she's continued on her inhalers which are Helyn Numbers, rescue inhaler.  On Saturday there was dramatic weather change, humidity and hot, started having sinus pressure, frontal area, followed by cough over the ensuing days, with yellow thick productive sputum and fatigue. Today states that she's not feeling well, not having any nasal drainage mainly thick yellow  productive cough. Patient has completed her primary function tests and 6 minute walk test, would like to discuss the results of it today.    Medication:   Current Outpatient Rx  Name  Route  Sig  Dispense  Refill  . albuterol (PROVENTIL HFA;VENTOLIN HFA) 108 (90 BASE) MCG/ACT inhaler      2 puffs every 3-4 hours as needed for shortness of breath\wheezing\recurrent cough   1 Inhaler   6   . albuterol (PROVENTIL) (2.5 MG/3ML) 0.083% nebulizer solution   Nebulization   Take 3 mLs (2.5 mg total) by nebulization every 4 (four) hours as needed.   75 mL   6   . baclofen (LIORESAL) 10 MG tablet   Oral   Take 10 mg by mouth 4 (four) times daily as needed (headaches).          Marland Kitchen BIOTIN PO   Oral   Take 2,000 mg by mouth daily.         . cetirizine (ZYRTEC) 10 MG tablet   Oral   Take 1 tablet (10 mg total) by mouth daily.   30 tablet   11   . citalopram (CELEXA) 40 MG tablet   Oral   Take 40 mg by mouth daily.         . cyclobenzaprine (FLEXERIL) 10 MG tablet   Oral   Take 0.5 tablets (5 mg total) by mouth 3 (three) times daily as needed for muscle spasms.   30 tablet   0   . esomeprazole (NEXIUM) 40 MG capsule   Oral   Take 40 mg by mouth daily.          Marland Kitchen estradiol (ESTRACE) 0.1 MG/GM vaginal cream      Apply 1 gram per vagina every night for 2 weeks, then apply three times a week   30 g   12   . estrogen, conjugated,-medroxyprogesterone (PREMPRO) 0.3-1.5 MG per tablet   Oral   Take 1 tablet by mouth daily.   30 tablet   12   . fluticasone (FLONASE) 50 MCG/ACT nasal spray      PLACE 2 SPRAYS INTO THE NOSE DAILY.   16 g   6   . ibuprofen (ADVIL,MOTRIN) 600 MG tablet   Oral   Take 1 tablet (600 mg total) by mouth 3 (three) times daily.   20 tablet   0   . ibuprofen (ADVIL,MOTRIN) 600 MG tablet   Oral   Take 1 tablet (600 mg total) by mouth every 6 (six) hours as needed.   30 tablet   0   . mometasone-formoterol (DULERA) 200-5 MCG/ACT AERO    Inhalation   Inhale 2 puffs into the lungs 2 (two) times daily.   8.8 g   0   . montelukast (SINGULAIR) 10 MG tablet   Oral   Take 1 tablet (10 mg total) by mouth at bedtime.   90 tablet   3   . oxybutynin (DITROPAN-XL) 10 MG 24 hr tablet   Oral   Take 10 mg by mouth as needed.         . propranolol (INDERAL) 80 MG tablet   Oral   Take 80 mg by mouth daily as needed (hedaches).          Marland Kitchen  propranolol ER (INDERAL LA) 80 MG 24 hr capsule      TAKE 1 CAPSULE BY MOUTH DAILY.   30 capsule   2   . Pyridoxine HCl (VITAMIN B-6 PO)   Oral   Take 1 tablet by mouth daily. Unknown OTC strength         . Spacer/Aero-Holding Chambers (AEROCHAMBER MV) inhaler      Use as instructed   1 each   0   . Tiotropium Bromide Monohydrate (SPIRIVA RESPIMAT) 2.5 MCG/ACT AERS   Inhalation   Inhale 2 puffs into the lungs daily.   4 g   3   . traZODone (DESYREL) 50 MG tablet   Oral   Take 50 mg by mouth at bedtime as needed for sleep.         . verapamil (CALAN) 40 MG tablet   Oral   Take 40 mg by mouth daily. For palpitations/arrhythmias         . vitamin C (ASCORBIC ACID) 500 MG tablet   Oral   Take 1,000 mg by mouth daily.            Review of Systems: Gen:  Denies  fever, sweats, chills HEENT: Denies blurred vision, double vision, ear pain, eye pain, hearing loss, nose bleeds, sore throat Cvc:  No dizziness, chest pain or heaviness Resp:   Admits to: cough, sinus congestion, yellow sputum production Gi: Denies swallowing difficulty, stomach pain, nausea or vomiting, diarrhea, constipation, bowel incontinence Gu:  Denies bladder incontinence, burning urine Ext:   No Joint pain, stiffness or swelling Skin: No skin rash, easy bruising or bleeding or hives Endoc:  No polyuria, polydipsia , polyphagia or weight change Other:  All other systems negative  Allergies:  Amitriptyline; Fluticasone-salmeterol; Penicillins; Ciprofloxacin; Macrobid; and  Sulfamethoxazole-trimethoprim  Physical Examination:  VS: BP 132/66 mmHg  Pulse 74  Ht 5\' 1"  (1.549 m)  Wt 198 lb (89.812 kg)  BMI 37.43 kg/m2  SpO2 96%  LMP 12/30/2014 (Exact Date)  General Appearance: No distress  HEENT: PERRLA, no ptosis, no other lesions noticed Pulmonary:normal breath sounds., diaphragmatic excursion normal.No wheezing, No rales   Cardiovascular:  Normal S1,S2.  No m/r/g.     Abdomen:Exam: Benign, Soft, non-tender, No masses  Skin:   warm, no rashes, no ecchymosis  Extremities: normal, no cyanosis, clubbing, warm with normal capillary refill.      6 minute walk tests 06/02/2015 -Distance 974 feet/297 m, low saturation 96%, highest heart rate 75. Pulmonary function testing FEV1 87% FEV1/FVC 93% ERV 21% DLCO uncorrected 71% Impression: No significant obstruction, restriction, no diffusion abnormality, severe reduction in ERV could be related to obesity (central obesity), clinical correlation advised.     Assessment and Plan: No problem-specific assessment & plan notes found for this encounter.   Updated Medication List Outpatient Encounter Prescriptions as of 06/27/2015  Medication Sig  . albuterol (PROVENTIL HFA;VENTOLIN HFA) 108 (90 BASE) MCG/ACT inhaler 2 puffs every 3-4 hours as needed for shortness of breath\wheezing\recurrent cough  . albuterol (PROVENTIL) (2.5 MG/3ML) 0.083% nebulizer solution Take 3 mLs (2.5 mg total) by nebulization every 4 (four) hours as needed.  . baclofen (LIORESAL) 10 MG tablet Take 10 mg by mouth 4 (four) times daily as needed (headaches).   Marland Kitchen BIOTIN PO Take 2,000 mg by mouth daily.  . cetirizine (ZYRTEC) 10 MG tablet Take 1 tablet (10 mg total) by mouth daily.  . citalopram (CELEXA) 40 MG tablet Take 40 mg by mouth daily.  . cyclobenzaprine (FLEXERIL)  10 MG tablet Take 0.5 tablets (5 mg total) by mouth 3 (three) times daily as needed for muscle spasms.  Marland Kitchen esomeprazole (NEXIUM) 40 MG capsule Take 40 mg by mouth daily.    Marland Kitchen estradiol (ESTRACE) 0.1 MG/GM vaginal cream Apply 1 gram per vagina every night for 2 weeks, then apply three times a week  . estrogen, conjugated,-medroxyprogesterone (PREMPRO) 0.3-1.5 MG per tablet Take 1 tablet by mouth daily.  . fluticasone (FLONASE) 50 MCG/ACT nasal spray PLACE 2 SPRAYS INTO THE NOSE DAILY.  Marland Kitchen ibuprofen (ADVIL,MOTRIN) 600 MG tablet Take 1 tablet (600 mg total) by mouth 3 (three) times daily.  Marland Kitchen ibuprofen (ADVIL,MOTRIN) 600 MG tablet Take 1 tablet (600 mg total) by mouth every 6 (six) hours as needed.  . mometasone-formoterol (DULERA) 200-5 MCG/ACT AERO Inhale 2 puffs into the lungs 2 (two) times daily.  . montelukast (SINGULAIR) 10 MG tablet Take 1 tablet (10 mg total) by mouth at bedtime.  Marland Kitchen oxybutynin (DITROPAN-XL) 10 MG 24 hr tablet Take 10 mg by mouth as needed.  . propranolol (INDERAL) 80 MG tablet Take 80 mg by mouth daily as needed (hedaches).   . propranolol ER (INDERAL LA) 80 MG 24 hr capsule TAKE 1 CAPSULE BY MOUTH DAILY.  Marland Kitchen Pyridoxine HCl (VITAMIN B-6 PO) Take 1 tablet by mouth daily. Unknown OTC strength  . Spacer/Aero-Holding Chambers (AEROCHAMBER MV) inhaler Use as instructed  . Tiotropium Bromide Monohydrate (SPIRIVA RESPIMAT) 2.5 MCG/ACT AERS Inhale 2 puffs into the lungs daily.  . traZODone (DESYREL) 50 MG tablet Take 50 mg by mouth at bedtime as needed for sleep.  . verapamil (CALAN) 40 MG tablet Take 40 mg by mouth daily. For palpitations/arrhythmias  . vitamin C (ASCORBIC ACID) 500 MG tablet Take 1,000 mg by mouth daily.  . [DISCONTINUED] Spacer/Aero-Holding Chambers (AEROCHAMBER PLUS) inhaler Please provide patient with spacer for MDI  . [DISCONTINUED] VENTOLIN HFA 108 (90 BASE) MCG/ACT inhaler Inhale 2 puffs into the lungs every 4 (four) hours as needed for wheezing or shortness of breath.  . [DISCONTINUED] predniSONE (DELTASONE) 20 MG tablet Take 1 tablet (20 mg total) by mouth 2 (two) times daily with a meal. (Patient not taking: Reported on  06/27/2015)  . [DISCONTINUED] Tiotropium Bromide Monohydrate (SPIRIVA RESPIMAT) 2.5 MCG/ACT AERS Inhale 2 puffs into the lungs daily.   No facility-administered encounter medications on file as of 06/27/2015.    Orders for this visit: No orders of the defined types were placed in this encounter.    Thank  you for the visitation and for allowing  [facility] to assist in the care of your patient. Our recommendations are noted above.  Please contact us if we can be of further service.  Vilinda Boehringer, MD [facility] Office Number: [phone]

## 2015-06-27 NOTE — Telephone Encounter (Signed)
Pt called and would like a refill on her Oxyboutynin. She is having the issue again with her bladder and kidneys. She has made an appointment for 9/6. jw

## 2015-06-27 NOTE — Assessment & Plan Note (Signed)
Patient with known history of asthma since 2004 have to recurrent bronchitis infections. Her asthma is currently on control. Triggers at this time could be weather changes and allergies. I suspect that prolonged smoking of history combination with asthma-also showing up as clinically COPD, however she has normal PFTs at this time, could be makes of asthma-COPD   Plan: -Continue with Spiriva Respimat 2.5 g, duler, and rescue inhaler -Continue with Dulera -Having symptoms of acute sinusitis, Levaquin and prednisone prescribe along with nasal saline rinse

## 2015-06-27 NOTE — Patient Instructions (Addendum)
Follow up with Dr. Stevenson Clinch in 3 months - levaquin 500 mg - 1 pill twice a day for 10 days - Prednisone 20mg  - 1 tab daily with breakfast for 5 days - nasal rinse - use 2-3 times per week - continue with your current inhalers.  - nasal saline rinse, 2-3 time per week while you are on the antibiotic

## 2015-06-28 MED ORDER — OXYBUTYNIN CHLORIDE ER 10 MG PO TB24: 10.0000 mg | ORAL_TABLET | Freq: Every day | ORAL | Status: DC

## 2015-06-28 NOTE — Telephone Encounter (Signed)
Refill sent  Virginia Crews, MD, MPH PGY-2,  [facility] 06/28/2015 8:49 AM

## 2015-07-05 ENCOUNTER — Ambulatory Visit: Payer: 59 | Admitting: Internal Medicine

## 2015-07-06 ENCOUNTER — Ambulatory Visit (INDEPENDENT_AMBULATORY_CARE_PROVIDER_SITE_OTHER): Payer: 59 | Admitting: Family Medicine

## 2015-07-06 ENCOUNTER — Encounter: Payer: Self-pay | Admitting: Family Medicine

## 2015-07-06 VITALS — BP 133/82 | HR 91 | Temp 98.3°F | Wt 197.0 lb

## 2015-07-06 DIAGNOSIS — R109 Unspecified abdominal pain: Secondary | ICD-10-CM | POA: Diagnosis not present

## 2015-07-06 DIAGNOSIS — R3915 Urgency of urination: Secondary | ICD-10-CM | POA: Diagnosis not present

## 2015-07-06 LAB — POCT URINALYSIS DIPSTICK
BILIRUBIN UA: NEGATIVE
Blood, UA: NEGATIVE
Glucose, UA: NEGATIVE
KETONES UA: NEGATIVE
LEUKOCYTES UA: NEGATIVE
Nitrite, UA: NEGATIVE
PROTEIN UA: NEGATIVE
SPEC GRAV UA: 1.02
Urobilinogen, UA: 1
pH, UA: 6.5

## 2015-07-06 NOTE — Assessment & Plan Note (Signed)
Likely related to bladder spasms No fever, rebound on exam, CVA tenderness, so doubt pyelonephritis, nephrolithiasis, appendicitis May take Tylenol or ibuprofen as needed for pain We'll await urine culture results Continue oxybutynin

## 2015-07-06 NOTE — Assessment & Plan Note (Signed)
Likely related to urge incontinence Some symptoms of urinary tract infection as well UA negative but patient recently on Levaquin for sinusitis Will await urine culture results Continue oxybutynin If symptoms do not improve with oxybutynin, consider urogynecology referral

## 2015-07-06 NOTE — Progress Notes (Signed)
   Subjective:   [name] is a 49 y.o. female with a history of OSA, migraines, depression, severe persistent asthma here for SDA appt for RLQ pain.  Burning pain in pelvis and R flank x2 wks Pain is constant Denies dysuria Urinary urgency & frequency Drinking cranberry juice, but no improvement Took one old oxybutinin that she had left for urge incontinence and it seemed to help some with spasm and led to more urination with each void instead of spasm and small voids more regularly No fevers/chills Ibuprofen helps pain some  s/p levaquin and prednisone for sinus infection per Pulm since 8/29 - no change in symptoms  Previously seen for urinary frequency 02/23/14 - no UTI  Has been taking ditropan since 8/30 which has somewhat improved symptoms   Objective:  BP 133/82 mmHg  Pulse 91  Temp(Src) 98.3 F (36.8 C) (Oral)  Wt 197 lb (89.359 kg)  LMP 12/30/2014 (Exact Date)  Gen:  49 y.o. female in NAD HEENT: NCAT, MMM, EOMI CV: RRR, no MRG Resp: Non-labored, CTAB, no wheezes noted Abd: Soft, ND, BS present, no guarding or organomegaly, mildly tender in R pelvis, no CVA tenderness Ext: WWP, no edema MSK: TTP over R SI joint Neuro: Alert and oriented, speech normal     UA: no evidence of infection  Assessment & Plan:     [name] is a 49 y.o. female here for R flank pain, urinary urgency.  Flank pain Likely related to bladder spasms No fever, rebound on exam, CVA tenderness, so doubt pyelonephritis, nephrolithiasis, appendicitis May take Tylenol or ibuprofen as needed for pain We'll await urine culture results Continue oxybutynin  Urinary urgency Likely related to urge incontinence Some symptoms of urinary tract infection as well UA negative but patient recently on Levaquin for sinusitis Will await urine culture results Continue oxybutynin If symptoms do not improve with oxybutynin, consider urogynecology referral    Virginia Crews, MD MPH PGY-2,   [facility] 07/06/2015  5:01 PM

## 2015-07-06 NOTE — Patient Instructions (Signed)
Nice to see you today. I will call you about the urine culture to let you know if this is an infection. If it is, we will start an anti-at that point. Continue to take your oxybutynin. If the symptoms do not resolve in the next 2 weeks, we will refer you to urogynecology.  Take care, Dr. B  Urinary Incontinence Urinary incontinence is the involuntary loss of urine from your bladder. CAUSES  There are many causes of urinary incontinence. They include:  Medicines.  Infections.  Prostatic enlargement, leading to overflow of urine from your bladder.  Surgery.  Neurological diseases.  Emotional factors. SIGNS AND SYMPTOMS Urinary Incontinence can be divided into four types: 1. Urge incontinence. Urge incontinence is the involuntary loss of urine before you have the opportunity to go to the bathroom. There is a sudden urge to void but not enough time to reach a bathroom. 2. Stress incontinence. Stress incontinence is the sudden loss of urine with any activity that forces urine to pass. It is commonly caused by anatomical changes to the pelvis and sphincter areas of your body. 3. Overflow incontinence. Overflow incontinence is the loss of urine from an obstructed opening to your bladder. This results in a backup of urine and a resultant buildup of pressure within the bladder. When the pressure within the bladder exceeds the closing pressure of the sphincter, the urine overflows, which causes incontinence, similar to water overflowing a dam. 4. Total incontinence. Total incontinence is the loss of urine as a result of the inability to store urine within your bladder. DIAGNOSIS  Evaluating the cause of incontinence may require:  A thorough and complete medical and obstetric history.  A complete physical exam.  Laboratory tests such as a urine culture and sensitivities. When additional tests are indicated, they can include:  An ultrasound exam.  Kidney and bladder X-rays.  Cystoscopy.  This is an exam of the bladder using a narrow scope.  Urodynamic testing to test the nerve function to the bladder and sphincter areas. TREATMENT  Treatment for urinary incontinence depends on the cause:  For urge incontinence caused by a bacterial infection, antibiotics will be prescribed. If the urge incontinence is related to medicines you take, your health care provider may have you change the medicine.  For stress incontinence, surgery to re-establish anatomical support to the bladder or sphincter, or both, will often correct the condition.  For overflow incontinence caused by an enlarged prostate, an operation to open the channel through the enlarged prostate will allow the flow of urine out of the bladder. In women with fibroids, a hysterectomy may be recommended.  For total incontinence, surgery on your urinary sphincter may help. An artificial urinary sphincter (an inflatable cuff placed around the urethra) may be required. In women who have developed a hole-like passage between their bladder and vagina (vesicovaginal fistula), surgery to close the fistula often is required. HOME CARE INSTRUCTIONS  Normal daily hygiene and the use of pads or adult diapers that are changed regularly will help prevent odors and skin damage.  Avoid caffeine. It can overstimulate your bladder.  Use the bathroom regularly. Try about every 2-3 hours to go to the bathroom, even if you do not feel the need to do so. Take time to empty your bladder completely. After urinating, wait a minute. Then try to urinate again.  For causes involving nerve dysfunction, keep a log of the medicines you take and a journal of the times you go to the bathroom. SEEK  MEDICAL CARE IF:  You experience worsening of pain instead of improvement in pain after your procedure.  Your incontinence becomes worse instead of better. SEE IMMEDIATE MEDICAL CARE IF:  You experience fever or shaking chills.  You are unable to pass your  urine.  You have redness spreading into your groin or down into your thighs. MAKE SURE YOU:   Understand these instructions.   Will watch your condition.  Will get help right away if you are not doing well or get worse. Document Released: 11/22/2004 Document Revised: 08/05/2013 Document Reviewed: 03/24/2013 [facility] Patient Information 2015 [place], [place]. This information is not intended to replace advice given to you by your health care provider. Make sure you discuss any questions you have with your health care provider.

## 2015-07-07 LAB — URINE CULTURE

## 2015-07-12 ENCOUNTER — Ambulatory Visit: Payer: Self-pay | Admitting: Family Medicine

## 2015-07-21 ENCOUNTER — Encounter: Payer: Self-pay | Admitting: Family Medicine

## 2015-07-21 ENCOUNTER — Ambulatory Visit (INDEPENDENT_AMBULATORY_CARE_PROVIDER_SITE_OTHER): Payer: 59 | Admitting: Family Medicine

## 2015-07-21 ENCOUNTER — Encounter: Payer: Self-pay | Admitting: *Deleted

## 2015-07-21 VITALS — BP 114/71 | HR 68 | Temp 98.4°F | Ht 61.0 in | Wt 199.0 lb

## 2015-07-21 DIAGNOSIS — R0600 Dyspnea, unspecified: Secondary | ICD-10-CM | POA: Diagnosis not present

## 2015-07-21 DIAGNOSIS — I5032 Chronic diastolic (congestive) heart failure: Secondary | ICD-10-CM | POA: Diagnosis not present

## 2015-07-21 DIAGNOSIS — R5383 Other fatigue: Secondary | ICD-10-CM | POA: Insufficient documentation

## 2015-07-21 MED ORDER — MOMETASONE FURO-FORMOTEROL FUM 200-5 MCG/ACT IN AERO: 2.0000 | INHALATION_SPRAY | Freq: Two times a day (BID) | RESPIRATORY_TRACT | Status: DC

## 2015-07-21 NOTE — Progress Notes (Signed)
Subjective:     Patient ID: [name], female   DOB: Apr 07, 1966, 49 y.o.   MRN: [id number]  H&P written and performed by Medical student Kara Dies Pisanie MS3 under supervision from Dr. Ree Kida  HPI 49 y.o Female with PMHx significant for Allergies, Asthma, OSA, and recently diagnosed COPD.   SOB W/ EXERTION: Since the beginning of the summer she has had increased SOB with daily activities at her job or during yard or house work. These activities did not bother her previously. She finds herself having to sit down and rest to catch her breath. She has also had increased swelling in her legs at the end of the day as well as fatigue since the beginning of the summer. She is also worried about the 15 lbs she has gained since all this started happening too. She says that her pulmonologist has placed her on Spiriva which has helped only a little. She states she has not been able to sleep lying flat for a while but it has recently been getting worse. She sleeps on 3 large pillows at night and has been using her CPAP machine. She has also been using her albuterol inhaler multiple times a day for the past two days as her symptoms have been getting worse. Denies chest pain, SOB at rest, blurry vision, dizziness, loss of consciousness, N/V/C/D or abdominal pain.   49 year old female presents for evaluation of increasing shortness of breath with exertion and weight gain, symptoms have been present since the beginning of summer, she does have a history of COPD and was recently seen by her pulmonologist, she received a course of Levaquin and prednisone. She has continued to take Spiriva and to wear as prescribed, she needs to use her albuterol a daily basis, she reports weight gain and increased swelling of her lower extremities, she reports nighttime orthopnea and PND, she does have a history of OSA and uses CPAP nightly.  Review of Systems Pertinent ROS as per HPI     Objective:   Physical Exam Vitals  reviewed GEN: NAD, well appearing CV: RRR, nl S1 S2 no m/r/g, no JVD cyanosis or clubbing. 1+ pitting edema in lower extremities. PULM: CTAB, NWOB, no w/c/r GI: s/nt/nd, no organomegaly.  NEURO: AOx3, nml ROM normal gait.  Extremities: No calf tenderness   Echo 2014 showed grade 1 diastolic failure Reviewed BNP and BMP from May 2016 which were unremarkable Patient has had multiple d-dimer is in the past year which were negative    Assessment and Plan   SOB W/ EXERTION: Likely multifactorial due to asthma, COPD and a possible underlying cardiac etiology given use of added inhalers and pulm eval with minor benefit as well as new onset leg swelling and increase PND. COPD and OSA also put her at increased risk of heart failure. Previous echo (2014) showed Grade 1 diastolic failure - Echo ordered. - Patient to make an appointment with cardiologist Dr. Acie Fredrickson.     I personally saw and evaluated the patient. I have reviewed the medical student note and agree with the documentation. I personally completed the physical exam. Additions to the medical student note are made in blue.   Dossie Arbour MD

## 2015-07-21 NOTE — Patient Instructions (Signed)
Thank you for coming in to see [place] today!  Sorry to hear about your shortness of breath. We think that your heart may be contributing to this and we want to check it out with an Echocardiogram. Continue your medications as prescribed. The number for [place] is [phone] to make an appointment to see Dr. Acie Fredrickson.   Thank you again and hope all is well.

## 2015-07-21 NOTE — Assessment & Plan Note (Signed)
Patient reports worsening dyspnea especially with exertion. Symptoms are concerning for possible cardiac etiology. Her COPD and OSA may also be contributing to her symptoms. -Check echocardiogram -Patient to follow-up with her cardiologist -If echocardiogram negative would consider patient following up with her pulmonologist for further titration of COPD medications

## 2015-07-28 ENCOUNTER — Other Ambulatory Visit: Payer: Self-pay

## 2015-07-28 ENCOUNTER — Ambulatory Visit (HOSPITAL_COMMUNITY): Payer: 59 | Attending: Cardiology

## 2015-07-28 DIAGNOSIS — R0600 Dyspnea, unspecified: Secondary | ICD-10-CM | POA: Diagnosis present

## 2015-07-28 DIAGNOSIS — I340 Nonrheumatic mitral (valve) insufficiency: Secondary | ICD-10-CM | POA: Diagnosis not present

## 2015-07-28 DIAGNOSIS — Z87891 Personal history of nicotine dependence: Secondary | ICD-10-CM | POA: Insufficient documentation

## 2015-07-28 DIAGNOSIS — G4733 Obstructive sleep apnea (adult) (pediatric): Secondary | ICD-10-CM | POA: Diagnosis not present

## 2015-07-28 DIAGNOSIS — I5032 Chronic diastolic (congestive) heart failure: Secondary | ICD-10-CM

## 2015-07-28 DIAGNOSIS — I517 Cardiomegaly: Secondary | ICD-10-CM | POA: Diagnosis not present

## 2015-07-29 ENCOUNTER — Telehealth: Payer: Self-pay | Admitting: Family Medicine

## 2015-07-29 MED ORDER — FUROSEMIDE 20 MG PO TABS: 20.0000 mg | ORAL_TABLET | Freq: Every day | ORAL | Status: DC

## 2015-07-29 NOTE — Telephone Encounter (Signed)
I am covering for Dr. Ree Kida who is away from the office.  Called patient to review echo results. Grade 1 diastolic dysfunction. Recommend trial of lasix 20mg  daily. F/u in 2 weeks in clinic for re-evaluation to see how she's doing and to check BMET. rx sent in. Also reminded patient to schedule appointment with her cardiologist. Patient agreeable and appreciative.  Leeanne Rio, MD

## 2015-08-15 ENCOUNTER — Encounter: Payer: Self-pay | Admitting: Family Medicine

## 2015-08-15 ENCOUNTER — Other Ambulatory Visit: Payer: Self-pay | Admitting: Family Medicine

## 2015-08-15 ENCOUNTER — Ambulatory Visit (INDEPENDENT_AMBULATORY_CARE_PROVIDER_SITE_OTHER): Payer: 59 | Admitting: Family Medicine

## 2015-08-15 VITALS — BP 149/107 | HR 91 | Temp 98.9°F | Ht 61.0 in | Wt 195.0 lb

## 2015-08-15 DIAGNOSIS — G4733 Obstructive sleep apnea (adult) (pediatric): Secondary | ICD-10-CM | POA: Diagnosis not present

## 2015-08-15 DIAGNOSIS — R5383 Other fatigue: Secondary | ICD-10-CM

## 2015-08-15 DIAGNOSIS — R0600 Dyspnea, unspecified: Secondary | ICD-10-CM

## 2015-08-15 NOTE — Progress Notes (Signed)
Date of Visit: 08/15/2015   HPI:  Patient presents to follow up on her shortness of breath and fatigue.  Previously seen by Dr. Ree Kida on 9/22 with complaints of orthopnea, fatigue, and dyspnea on exertion. Had echo which showed grade 1 diastolic dysfxn, consistent with prior result. Started on empiric lasix 20mg  daily.   Patient reports improvement in breathing and swelling since starting lasix. She does still feel very fatigued and exhausted chronically. Endorses some nausea. Her biggest complaint today is that several times now she has gone to sleep and it was very difficult for her to wake up. On Saturday she went to bed in the morning (works night shift) and awoke around 6pm, thinking it was 6am. She became quite upset at her confusion. She is concerned about falling asleep and not being able to wake up normally. Does have history of OSA on CPAP. Last sleep study was at least 2-3 years ago per patient. In that interval of time, she has gained nearly 40 pounds. Denies exertional chest pain. Only gets chest discomfort when she is psychologically upset.  ROS: See HPI.  [place]: history of asthma, chronic diastolic CHF, depression/anxiety, HLD, vitamin D deficiency, OSA on CPAP  PHYSICAL EXAM: BP 149/107 mmHg  Pulse 91  Temp(Src) 98.9 F (37.2 C) (Oral)  Ht 5\' 1"  (1.549 m)  Wt 195 lb (88.451 kg)  BMI 36.86 kg/m2  LMP 12/30/2014 (Exact Date) Gen: NAD. Tearful when discussing sleep issues HEENT: normocephalic, atraumatic. moist mucous membranes. Heart: regular rate and rhythm no murmur Lungs: clear to auscultation bilaterally, NWOB Neuro: grossly nonfocal, speech normal Ext: no edema bilateral lower extremities  ASSESSMENT/PLAN:  Dyspnea Improved with lasix, but still reporting significant fatigue. Appears euvolemic today. Fatigue likely multifactorial (stress/psych, grade 1 diastolic CHF, untreated vitamin D deficiency). After discussion with patient, we decided to do the  following: -check labs: CMET, TSH, CBC, vit D -patient to undergo another CPAP titration study as her settings likely need to be changed due to 40lb weight gain over last several years -close follow up with PCP scheduled in 2-3 weeks   FOLLOW UP: F/u in 3 weeks with PCP for above issues  [place] J. Ardelia Mems, [place]

## 2015-08-15 NOTE — Patient Instructions (Signed)
It was nice to meet you today!  We will get you set up for a CPAP titration study Come back for labwork Follow up with Dr. B as scheduled Call with any questions  Be well, Dr. Ardelia Mems

## 2015-08-16 ENCOUNTER — Telehealth: Payer: Self-pay | Admitting: Family Medicine

## 2015-08-16 ENCOUNTER — Other Ambulatory Visit: Payer: 59

## 2015-08-16 ENCOUNTER — Other Ambulatory Visit: Payer: Self-pay | Admitting: Family Medicine

## 2015-08-16 DIAGNOSIS — R5383 Other fatigue: Secondary | ICD-10-CM

## 2015-08-16 LAB — CBC
HEMATOCRIT: 38 % (ref 36.0–46.0)
Hemoglobin: 12.7 g/dL (ref 12.0–15.0)
MCH: 30.8 pg (ref 26.0–34.0)
MCHC: 33.4 g/dL (ref 30.0–36.0)
MCV: 92.2 fL (ref 78.0–100.0)
MPV: 9.8 fL (ref 8.6–12.4)
Platelets: 341 10*3/uL (ref 150–400)
RBC: 4.12 MIL/uL (ref 3.87–5.11)
RDW: 12.8 % (ref 11.5–15.5)
WBC: 5.1 10*3/uL (ref 4.0–10.5)

## 2015-08-16 LAB — COMPREHENSIVE METABOLIC PANEL
ALK PHOS: 35 U/L (ref 33–115)
ALT: 16 U/L (ref 6–29)
AST: 15 U/L (ref 10–35)
Albumin: 4.1 g/dL (ref 3.6–5.1)
BUN: 13 mg/dL (ref 7–25)
CO2: 28 mmol/L (ref 20–31)
Calcium: 9.4 mg/dL (ref 8.6–10.2)
Chloride: 100 mmol/L (ref 98–110)
Creat: 0.84 mg/dL (ref 0.50–1.10)
Glucose, Bld: 78 mg/dL (ref 65–99)
POTASSIUM: 4.1 mmol/L (ref 3.5–5.3)
SODIUM: 136 mmol/L (ref 135–146)
Total Bilirubin: 0.4 mg/dL (ref 0.2–1.2)
Total Protein: 7.1 g/dL (ref 6.1–8.1)

## 2015-08-16 LAB — LIPID PANEL
Cholesterol: 189 mg/dL (ref 125–200)
HDL: 49 mg/dL (ref >=46)
LDL CALC: 116 mg/dL (ref <130)
Total CHOL/HDL Ratio: 3.9 Ratio (ref <=5.0)
Triglycerides: 122 mg/dL (ref <150)
VLDL: 24 mg/dL (ref <30)

## 2015-08-16 LAB — TSH: TSH: 1.655 u[IU]/mL (ref 0.350–4.500)

## 2015-08-16 NOTE — Telephone Encounter (Signed)
Forms placed in PCP box for completion. 

## 2015-08-16 NOTE — Telephone Encounter (Signed)
Patient dropped off FMLA papers to be filled out.  Please fax when completed and call her to pick up a copy.

## 2015-08-16 NOTE — Progress Notes (Signed)
Labs done today [name] 

## 2015-08-17 ENCOUNTER — Emergency Department (HOSPITAL_COMMUNITY): Payer: 59

## 2015-08-17 ENCOUNTER — Emergency Department (HOSPITAL_COMMUNITY)
Admission: EM | Admit: 2015-08-17 | Discharge: 2015-08-17 | Disposition: A | Discharge status: Home / Self Care | Payer: 59 | Attending: Emergency Medicine | Admitting: Emergency Medicine

## 2015-08-17 ENCOUNTER — Encounter (HOSPITAL_COMMUNITY): Payer: Self-pay | Admitting: Emergency Medicine

## 2015-08-17 ENCOUNTER — Other Ambulatory Visit: Payer: Self-pay

## 2015-08-17 DIAGNOSIS — Z79899 Other long term (current) drug therapy: Secondary | ICD-10-CM | POA: Diagnosis not present

## 2015-08-17 DIAGNOSIS — J45901 Unspecified asthma with (acute) exacerbation: Secondary | ICD-10-CM | POA: Diagnosis not present

## 2015-08-17 DIAGNOSIS — K219 Gastro-esophageal reflux disease without esophagitis: Secondary | ICD-10-CM | POA: Insufficient documentation

## 2015-08-17 DIAGNOSIS — Z862 Personal history of diseases of the blood and blood-forming organs and certain disorders involving the immune mechanism: Secondary | ICD-10-CM | POA: Diagnosis not present

## 2015-08-17 DIAGNOSIS — Z87891 Personal history of nicotine dependence: Secondary | ICD-10-CM | POA: Diagnosis not present

## 2015-08-17 DIAGNOSIS — G4730 Sleep apnea, unspecified: Secondary | ICD-10-CM | POA: Insufficient documentation

## 2015-08-17 DIAGNOSIS — R079 Chest pain, unspecified: Secondary | ICD-10-CM | POA: Diagnosis not present

## 2015-08-17 DIAGNOSIS — G43909 Migraine, unspecified, not intractable, without status migrainosus: Secondary | ICD-10-CM | POA: Diagnosis not present

## 2015-08-17 DIAGNOSIS — F419 Anxiety disorder, unspecified: Secondary | ICD-10-CM | POA: Insufficient documentation

## 2015-08-17 DIAGNOSIS — Z880 Allergy status to penicillin: Secondary | ICD-10-CM | POA: Diagnosis not present

## 2015-08-17 DIAGNOSIS — R5383 Other fatigue: Secondary | ICD-10-CM | POA: Diagnosis not present

## 2015-08-17 DIAGNOSIS — R630 Anorexia: Secondary | ICD-10-CM | POA: Insufficient documentation

## 2015-08-17 LAB — BASIC METABOLIC PANEL
Anion gap: 8 (ref 5–15)
BUN: 12 mg/dL (ref 6–20)
CHLORIDE: 99 mmol/L — AB (ref 101–111)
CO2: 28 mmol/L (ref 22–32)
CREATININE: 0.86 mg/dL (ref 0.44–1.00)
Calcium: 9.3 mg/dL (ref 8.9–10.3)
GFR calc Af Amer: >60 mL/min (ref >60)
GFR calc non Af Amer: >60 mL/min (ref >60)
GLUCOSE: 89 mg/dL (ref 65–99)
POTASSIUM: 3.7 mmol/L (ref 3.5–5.1)
SODIUM: 135 mmol/L (ref 135–145)

## 2015-08-17 LAB — I-STAT TROPONIN, ED: Troponin i, poc: 0 ng/mL (ref 0.00–0.08)

## 2015-08-17 LAB — CBC
HEMATOCRIT: 38.2 % (ref 36.0–46.0)
HEMOGLOBIN: 12.9 g/dL (ref 12.0–15.0)
MCH: 31.5 pg (ref 26.0–34.0)
MCHC: 33.8 g/dL (ref 30.0–36.0)
MCV: 93.2 fL (ref 78.0–100.0)
Platelets: 268 10*3/uL (ref 150–400)
RBC: 4.1 MIL/uL (ref 3.87–5.11)
RDW: 12.5 % (ref 11.5–15.5)
WBC: 4.9 10*3/uL (ref 4.0–10.5)

## 2015-08-17 LAB — TROPONIN I: Troponin I: <0.03 ng/mL (ref <0.031)

## 2015-08-17 LAB — VITAMIN D 25 HYDROXY (VIT D DEFICIENCY, FRACTURES): Vit D, 25-Hydroxy: 13 ng/mL — ABNORMAL LOW (ref 30–100)

## 2015-08-17 MED ORDER — SODIUM CHLORIDE 0.9 % IV BOLUS (SEPSIS): 1000.0000 mL | Freq: Once | INTRAVENOUS | Status: DC

## 2015-08-17 MED ORDER — SODIUM CHLORIDE 0.9 % IV BOLUS (SEPSIS)
500.0000 mL | Freq: Once | INTRAVENOUS | Status: AC
  Administered 2015-08-17: 500 mL via INTRAVENOUS

## 2015-08-17 MED ORDER — ONDANSETRON HCL 4 MG/2ML IJ SOLN
4.0000 mg | Freq: Once | INTRAMUSCULAR | Status: AC
  Administered 2015-08-17: 4 mg via INTRAVENOUS
  Filled 2015-08-17: qty 2

## 2015-08-17 MED ORDER — FENTANYL CITRATE (PF) 100 MCG/2ML IJ SOLN
100.0000 ug | Freq: Once | INTRAMUSCULAR | Status: AC
  Administered 2015-08-17: 100 ug via INTRAVENOUS
  Filled 2015-08-17: qty 2

## 2015-08-17 NOTE — ED Provider Notes (Signed)
CSN: [id number]     Arrival date & time 08/17/15  0800 History   First MD Initiated Contact with Patient 08/17/15 [phone]     Chief Complaint  Patient presents with  . Chest Pain  . Shortness of Breath     (Consider location/radiation/quality/duration/timing/severity/associated sxs/prior Treatment) HPI   49 year old female here with multiple complaints and which are new some which are not. Her main complaint seems to be fatigue. This has been ongoing and has been seen by her doctor multiple times for the same. Over the last 24 hours she has had a couple episodes of chest discomfort and separately source of breath. She's had these before. No other associated symptoms. No fever, cough, leg swelling. She states she is on Lasix and has had some weight loss recently because of that. No other associated weight changes. No recent travels, surgeries or sick contacts.  Past Medical History  Diagnosis Date  . Migraines   . Asthma   . Anemia   . Anxiety   . Depression   . PONV (postoperative nausea and vomiting)   . Dysrhythmia     Irregular heat w/ asthma episodes and anxiety  . Sleep apnea     uses CPAP  . GERD (gastroesophageal reflux disease)   . Urinary incontinence     frequent urination- tx w/ vesicare   Past Surgical History  Procedure Laterality Date  . Ablation    . Cesarean section      x3  . Mab      x 4  . Dilation and curettage of uterus  05/31/2005    MAB, TAB x 2  . Tubal ligation  11/01/2006  . Leep    . Hernia repair  1990's  . Wisdom tooth extraction    . Mouth surgery      teeth extraction  . Hysteroscopy w/ endometrial ablation     Family History  Problem Relation Age of Onset  . Diabetes Maternal Grandmother   . Anesthesia problems Neg Hx    Social History  Substance Use Topics  . Smoking status: Former Smoker -- 0.25 packs/day for 15 years    Types: Cigarettes    Quit date: 02/16/2003  . Smokeless tobacco: Never Used  . Alcohol Use: Yes     Comment:  socially   OB History    Gravida Para Term Preterm AB TAB SAB Ectopic Multiple Living   15 8 8  0 7 1 2   4      Review of Systems  Constitutional: Positive for activity change and fatigue. Negative for fever and chills.  HENT: Negative for congestion.   Respiratory: Positive for cough and shortness of breath.   Cardiovascular: Positive for chest pain. Negative for palpitations and leg swelling.  Gastrointestinal: Negative for nausea, vomiting and abdominal pain.  Endocrine: Negative for polydipsia and polyuria.  Genitourinary: Negative for dysuria and pelvic pain.  All other systems reviewed and are negative.     Allergies  Amitriptyline; Fluticasone-salmeterol; Penicillins; Ciprofloxacin; Macrobid; and Sulfamethoxazole-trimethoprim  Home Medications   Prior to Admission medications   Medication Sig Start Date End Date Taking? Authorizing Provider  albuterol (PROVENTIL HFA;VENTOLIN HFA) 108 (90 BASE) MCG/ACT inhaler 2 puffs every 3-4 hours as needed for shortness of breath\wheezing\recurrent cough 05/09/15   Vishal Mungal, MD  albuterol (PROVENTIL) (2.5 MG/3ML) 0.083% nebulizer solution Take 3 mLs (2.5 mg total) by nebulization every 4 (four) hours as needed. 12/31/14   Virginia Crews, MD  baclofen (LIORESAL) 10 MG tablet  Take 10 mg by mouth 4 (four) times daily as needed (headaches).     Historical Provider, MD  BIOTIN PO Take 2,000 mg by mouth daily.    Historical Provider, MD  cetirizine (ZYRTEC) 10 MG tablet Take 1 tablet (10 mg total) by mouth daily. 08/10/14   Sharon [address], MD  citalopram (CELEXA) 40 MG tablet Take 40 mg by mouth daily.    Historical Provider, MD  cyclobenzaprine (FLEXERIL) 10 MG tablet Take 0.5 tablets (5 mg total) by mouth 3 (three) times daily as needed for muscle spasms. Patient not taking: Reported on 08/15/2015 05/31/15   Aquilla Hacker, MD  esomeprazole (NEXIUM) 40 MG capsule Take 40 mg by mouth daily.     Historical Provider, MD  estradiol  (ESTRACE) 0.1 MG/GM vaginal cream Apply 1 gram per vagina every night for 2 weeks, then apply three times a week 01/14/15   Osborne Oman, MD  estrogen, conjugated,-medroxyprogesterone (PREMPRO) 0.3-1.5 MG per tablet Take 1 tablet by mouth daily. 01/19/15   Sallyanne Havers Anyanwu, MD  fluticasone (FLONASE) 50 MCG/ACT nasal spray PLACE 2 SPRAYS INTO THE NOSE DAILY. 04/15/14   Renee A Kuneff, DO  furosemide (LASIX) 20 MG tablet TAKE 1 TABLET BY MOUTH DAILY. 08/16/15   Virginia Crews, MD  ibuprofen (ADVIL,MOTRIN) 600 MG tablet Take 1 tablet (600 mg total) by mouth 3 (three) times daily. 03/29/15   Dahlia Bailiff, PA-C  ibuprofen (ADVIL,MOTRIN) 600 MG tablet Take 1 tablet (600 mg total) by mouth every 6 (six) hours as needed. 05/31/15   York Ram Melancon, MD  mometasone-formoterol (DULERA) 200-5 MCG/ACT AERO Inhale 2 puffs into the lungs 2 (two) times daily. 07/21/15   Lupita Dawn, MD  montelukast (SINGULAIR) 10 MG tablet Take 1 tablet (10 mg total) by mouth at bedtime. 08/10/14   [place], MD  oxybutynin (DITROPAN-XL) 10 MG 24 hr tablet Take 1 tablet (10 mg total) by mouth at bedtime. 06/28/15   Virginia Crews, MD  predniSONE (DELTASONE) 20 MG tablet Take 1 tablet (20 mg total) by mouth daily. 06/27/15 06/26/16  Vilinda Boehringer, MD  propranolol (INDERAL) 80 MG tablet Take 80 mg by mouth daily as needed (hedaches).     Historical Provider, MD  propranolol ER (INDERAL LA) 80 MG 24 hr capsule TAKE 1 CAPSULE BY MOUTH DAILY. 05/11/15   Virginia Crews, MD  Pyridoxine HCl (VITAMIN B-6 PO) Take 1 tablet by mouth daily. Unknown OTC strength    Historical Provider, MD  Spacer/Aero-Holding Chambers (AEROCHAMBER MV) inhaler Use as instructed 05/09/15   Vilinda Boehringer, MD  Tiotropium Bromide Monohydrate (SPIRIVA RESPIMAT) 2.5 MCG/ACT AERS Inhale 2 puffs into the lungs daily. 05/09/15   Vishal Mungal, MD  traZODone (DESYREL) 50 MG tablet Take 50 mg by mouth at bedtime as needed for sleep.    Historical Provider,  MD  verapamil (CALAN) 40 MG tablet Take 40 mg by mouth daily. For palpitations/arrhythmias    Historical Provider, MD  vitamin C (ASCORBIC ACID) 500 MG tablet Take 1,000 mg by mouth daily.    Historical Provider, MD   BP 143/74 mmHg  Pulse 58  Temp(Src) 98.5 F (36.9 C) (Oral)  Resp 16  SpO2 100%  LMP 12/30/2014 (Exact Date) Physical Exam  Constitutional: She is oriented to person, place, and time. She appears well-developed and well-nourished.  HENT:  Head: Normocephalic and atraumatic.  Eyes: Conjunctivae and EOM are normal. Right eye exhibits no discharge. Left eye exhibits no discharge.  Cardiovascular:  Normal rate and regular rhythm.   Pulmonary/Chest: Effort normal and breath sounds normal. No respiratory distress.  Abdominal: Soft. She exhibits no distension. There is no tenderness. There is no rebound.  Musculoskeletal: Normal range of motion. She exhibits no edema or tenderness.  Neurological: She is alert and oriented to person, place, and time.  Skin: Skin is warm and dry.  Nursing note and vitals reviewed.   ED Course  Procedures (including critical care time) Labs Review Labs Reviewed  BASIC METABOLIC PANEL - Abnormal; Notable for the following:    Chloride 99 (*)    All other components within normal limits  TROPONIN I  CBC  TROPONIN I  I-STAT TROPOININ, ED    Imaging Review Dg Chest 2 View  08/17/2015  CLINICAL DATA:  Chest tightness, shortness of breath EXAM: CHEST  2 VIEW COMPARISON:  03/29/2015 FINDINGS: The heart size and mediastinal contours are within normal limits. Both lungs are clear. The visualized skeletal structures are unremarkable. IMPRESSION: No active cardiopulmonary disease. Electronically Signed   By: Kathreen Devoid   On: 08/17/2015 08:41   I have personally reviewed and evaluated these images and lab results as part of my medical decision-making.   EKG Interpretation   Date/Time:  Wednesday August 17 2015 08:09:01 EDT Ventricular Rate:   67 PR Interval:  151 QRS Duration: 73 QT Interval:  394 QTC Calculation: 416 R Axis:   37 Text Interpretation:  Sinus rhythm Probable anteroseptal infarct, old  Slight elevation in V2 same as multiple previous No significant change  since last tracing Confirmed by [facility] MD, Corene Cornea [phone]) on 08/17/2015  8:38:06 AM      MDM   Final diagnoses:  Other fatigue   Low risk for ACS, will get delta troponin. PERC negative, doubt PE. No anemia on cbc. Electrolytes normal. cxr and ecg normal. Unsure of cause of fatigue, maybe inproper OSA treatment vs deconditioning. Will refer to PCP for further workup if troponins negative.   Her second troponin was negative patient is asymptomatic on emergency department.like her symptoms are likely related to multiple factors this time and will ask to follow with her primary doctor to get her sleep apnea study rescheduled, discussed medications for energy, discussed diet and exercise plan as well.  I have personally and contemperaneously reviewed labs and imaging and used in my decision making as above.   A medical screening exam was performed and I feel the patient has had an appropriate workup for their chief complaint at this time and likelihood of emergent condition existing is low. They have been counseled on decision, discharge, follow up and which symptoms necessitate immediate return to the emergency department. They or their family verbally stated understanding and agreement with plan and discharged in stable condition.      Merrily Pew, MD 08/17/15 1535

## 2015-08-17 NOTE — ED Notes (Signed)
Pt arrives from work not feeling well since Sunday. States felt pressure, heaviness and burning in her chest this morning while at work. Pt reports hx of sleep apnea. States new changes to CPAP machine, feels tired, worried she might fall asleep while driving. VSS.

## 2015-08-17 NOTE — Assessment & Plan Note (Addendum)
Improved with lasix, but still reporting significant fatigue. Appears euvolemic today. Fatigue likely multifactorial (stress/psych, grade 1 diastolic CHF, untreated vitamin D deficiency). After discussion with patient, we decided to do the following: -check labs: CMET, TSH, CBC, vit D -patient to undergo another CPAP titration study as her settings likely need to be changed due to 40lb weight gain over last several years -close follow up with PCP scheduled in 2-3 weeks

## 2015-08-18 ENCOUNTER — Telehealth: Payer: Self-pay | Admitting: Family Medicine

## 2015-08-18 DIAGNOSIS — G4733 Obstructive sleep apnea (adult) (pediatric): Secondary | ICD-10-CM

## 2015-08-18 NOTE — Telephone Encounter (Signed)
Received handwritten phone memo that Terri from [facility] called and needs CPAP titration study changed to split night study.  Order has been changed. Please call Terri and let her know [phone]).  Thanks, Leeanne Rio, MD

## 2015-08-18 NOTE — Telephone Encounter (Signed)
From chart review, unclear why patient is requesting FMLA.  Attempted to call patient. Left VM asking to call back to clinic to clarify why she needs FMLA.  Virginia Crews, MD, MPH PGY-2,  [facility] 08/18/2015 1:52 PM

## 2015-08-19 ENCOUNTER — Telehealth: Payer: Self-pay | Admitting: Family Medicine

## 2015-08-19 ENCOUNTER — Encounter: Payer: Self-pay | Admitting: Family Medicine

## 2015-08-19 MED ORDER — CHOLECALCIFEROL 1.25 MG (50000 UT) PO TABS: 1.0000 | ORAL_TABLET | ORAL | Status: DC

## 2015-08-19 NOTE — Telephone Encounter (Signed)
Pt called and stated that it is because of her asthma/COPD and migraine headaches. These forms have already been filled out years prior to this year, and if desired pt will bring in previous forms. She states that she needs this to protect her job for when she needs to call out when she experiences symptoms associated with her diagnoses. She is requesting a copy for herself as well. Thank you, Fonda Kinder, ASA

## 2015-08-19 NOTE — Telephone Encounter (Signed)
Called patient to discuss lab results. All normal except for vit D being low. Recommend 50,000 units cholecalciferol weekly for 8 weeks, then recheck. Will send in rx. Patient appreciative.  Lipid panel looks good - ASCVD risk score is 3.3% if answer "no" to being treated for hypertension. Patient is on verapamil for palpitations and propranolol for headaches, neither of these for actual hypertension. If answer "yes" to being treated for hypertension, risk is 6.4% and recommendation is to consider moderate intensity statin. Records reviewed - BP's have been controlled during office visits when patient does not have acute complaint/stressor.  No mandatory indication for statin presently. Will defer discussion about initiation of statin to PCP when patient follows up.  Leeanne Rio, MD

## 2015-08-22 NOTE — Telephone Encounter (Signed)
Left message on voicemail informing patient of message from MD. 

## 2015-08-22 NOTE — Telephone Encounter (Signed)
Called patient this AM to request that she bring in old FMLA forms as this would be helpful to fill out new ones in similar way.  Kendra Crews, MD, MPH PGY-2,  [facility] 08/22/2015 11:54 AM

## 2015-08-23 NOTE — Telephone Encounter (Signed)
Pt returned call, I informed her of below, and she stated that she will make a copy and drop it off tomorrow (08/24/2015) for provider. Sadie Reynolds, ASA

## 2015-08-24 ENCOUNTER — Telehealth: Payer: Self-pay | Admitting: Family Medicine

## 2015-08-24 NOTE — Telephone Encounter (Signed)
Patient dropped off old FMLA papers for dr to see.  Placed in dr's box.

## 2015-08-24 NOTE — Telephone Encounter (Signed)
Forms placed in PCP box. 

## 2015-08-25 NOTE — Telephone Encounter (Signed)
Patient informed that FMLA paper work is complete.  Forms faxed to [employer] per request. Forms copied for scanning in patient's chart.  Derl Barrow, RN

## 2015-08-25 NOTE — Telephone Encounter (Signed)
Forms completed and given to [place].  Virginia Crews, MD, MPH PGY-2,  [facility] 08/25/2015 1:32 PM

## 2015-09-09 ENCOUNTER — Ambulatory Visit (INDEPENDENT_AMBULATORY_CARE_PROVIDER_SITE_OTHER): Payer: 59 | Admitting: Family Medicine

## 2015-09-09 ENCOUNTER — Encounter: Payer: Self-pay | Admitting: Family Medicine

## 2015-09-09 VITALS — BP 128/88 | HR 74 | Temp 98.2°F | Ht 61.0 in | Wt 197.0 lb

## 2015-09-09 DIAGNOSIS — I5032 Chronic diastolic (congestive) heart failure: Secondary | ICD-10-CM

## 2015-09-09 DIAGNOSIS — E785 Hyperlipidemia, unspecified: Secondary | ICD-10-CM

## 2015-09-09 DIAGNOSIS — R5383 Other fatigue: Secondary | ICD-10-CM | POA: Insufficient documentation

## 2015-09-09 DIAGNOSIS — G4733 Obstructive sleep apnea (adult) (pediatric): Secondary | ICD-10-CM

## 2015-09-09 DIAGNOSIS — R5382 Chronic fatigue, unspecified: Secondary | ICD-10-CM

## 2015-09-09 LAB — POCT GLYCOSYLATED HEMOGLOBIN (HGB A1C): HEMOGLOBIN A1C: 5.9

## 2015-09-09 MED ORDER — SIMVASTATIN 40 MG PO TABS: 40.0000 mg | ORAL_TABLET | Freq: Every day | ORAL | Status: DC

## 2015-09-09 MED ORDER — PROPRANOLOL HCL ER 80 MG PO CP24: 80.0000 mg | ORAL_CAPSULE | Freq: Every day | ORAL | Status: DC

## 2015-09-09 MED ORDER — FUROSEMIDE 20 MG PO TABS: 20.0000 mg | ORAL_TABLET | Freq: Every day | ORAL | Status: DC

## 2015-09-09 NOTE — Progress Notes (Signed)
Subjective:   [name] is a 49 y.o. female with a history of HFpEF, HLD, OSA, depression and anxiety here for abd pin, fatigue f/u.  abd pain: - started today - like gas pain - stopped taking probiotics because they were making her go to bathroom too much - last BM today - no constipation - stools are more yellow - just noticed today - avoiding fried foods, eating more vegetables - no blood - eating a lot of hard-boiled eggs and having more gas  Fatigue: - patient previously seen 9/22 by Dr. Ree Kida with complaints of orthopnea, fatigue, dyspnea on exertion. Echo showed grade 1 diastolic dysfunction, consistent with prior results. Patient started on empirically 620 mg daily. - seen again 10/17 by Dr. Ardelia Mems - thought to be multifactorial related to stress, psych issues, grade 1 diastolic CHF, untreated vitamin D deficiency, possible need for C Pap titration  - Patient scheduled for sleep study for C Pap titration 11/06/15 - feels like she is tired all the time and takes daily nap after work - has run out of lasix and feels like she was better on lasix - felt like she was swollen yesterday (+LE edema) - taking vit D and that seems to be helping - no CP, SOB  HLD: ASCVD risk score is 3.3% if answer "no" to being treated for hypertension. Patient is on verapamil (only takes prn) for palpitations and propranolol for headaches, neither of these for actual hypertension. If answer "yes" to being treated for hypertension, risk is 6.4% and recommendation is to consider moderate intensity statin. - patient reports she would rather be safe than sorry - BP readings have been elevated in the past  Review of Systems:  Per HPI.   PMH, PSH, Medications, Allergies, and FmHx reviewed and updated in EMR.  Social History: former smoker - quit in 2004  Objective:  BP 128/88 mmHg  Pulse 74  Temp(Src) 98.2 F (36.8 C) (Oral)  Ht 5\' 1"  (1.549 m)  Wt 197 lb (89.359 kg)  BMI 37.24 kg/m2  SpO2  99%  LMP 12/30/2014 (Exact Date)  Gen:  49 y.o. female in NAD HEENT: NCAT, MMM, EOMI, PERRL, anicteric sclerae CV: RRR, no MRG, intact distal pulses Resp: Non-labored, CTAB, no wheezes noted Abd: Soft, NTND, BS present, no guarding or organomegaly Ext: WWP, 1+ edema bilaterally MSK: moves all extremities Neuro: Alert and oriented, speech normal      Chemistry      Component Value Date/Time   NA 135 08/17/2015 0908   K 3.7 08/17/2015 0908   CL 99* 08/17/2015 0908   CO2 28 08/17/2015 0908   BUN 12 08/17/2015 0908   CREATININE 0.86 08/17/2015 0908   CREATININE 0.84 08/16/2015 1106      Component Value Date/Time   CALCIUM 9.3 08/17/2015 0908   ALKPHOS 35 08/16/2015 1106   AST 15 08/16/2015 1106   ALT 16 08/16/2015 1106   BILITOT 0.4 08/16/2015 1106      Lab Results  Component Value Date   WBC 4.9 08/17/2015   HGB 12.9 08/17/2015   HCT 38.2 08/17/2015   MCV 93.2 08/17/2015   PLT 268 08/17/2015   Lab Results  Component Value Date   TSH 1.655 08/16/2015   Lab Results  Component Value Date   HGBA1C 5.9 09/09/2015   Assessment & Plan:     [name] is a 49 y.o. female here for   Chronic diastolic heart failure Mildly hypervolemic with LE edema today  Refilled lasix  Obstructive sleep apnea Likely contributing to fatigue Scheduled for sleep study   HLD (hyperlipidemia) Start simvastatin for primary prevention Screening A1c  Fatigue Slightly improved with vit D supplementation Likely multifactorial (psych, g1DD, vit D deficinecy, need for CPAP titration) Continue to monitor with above interventions Repeat Vit D level in 4-6 weeks to determine need for further supplementaiton    Virginia Crews, MD MPH PGY-2,  [facility] 09/10/2015  10:34 AM

## 2015-09-09 NOTE — Patient Instructions (Signed)
Nice to see you again today. I refilled her medications. We will check an A1c today to make sure you do not have diabetes. We will start simvastatin daily for your cholesterol. Come back to see me in about a month to recheck your vitamin D and see how the simvastatin is treating you.  Take care, Dr. B Cholesterol Cholesterol is a white, waxy, fat-like substance needed by your body in small amounts. The liver makes all the cholesterol you need. Cholesterol is carried from the liver by the blood through the blood vessels. Deposits of cholesterol (plaque) may build up on blood vessel walls. These make the arteries narrower and stiffer. Cholesterol plaques increase the risk for heart attack and stroke.  You cannot feel your cholesterol level even if it is very high. The only way to know it is high is with a blood test. Once you know your cholesterol levels, you should keep a record of the test results. Work with your health care provider to keep your levels in the desired range.  WHAT DO THE RESULTS MEAN?  Total cholesterol is a rough measure of all the cholesterol in your blood.   LDL is the so-called bad cholesterol. This is the type that deposits cholesterol in the walls of the arteries. You want this level to be low.   HDL is the good cholesterol because it cleans the arteries and carries the LDL away. You want this level to be high.  Triglycerides are fat that the body can either burn for energy or store. High levels are closely linked to heart disease.  WHAT ARE THE DESIRED LEVELS OF CHOLESTEROL?  Total cholesterol below 200.   LDL below 100 for people at risk, below 70 for those at very high risk.   HDL above 50 is good, above 60 is best.   Triglycerides below 150.  HOW CAN I LOWER MY CHOLESTEROL?  Diet. Follow your diet programs as directed by your health care provider.   Choose fish or white meat chicken and [place], roasted or baked. Limit fatty cuts of red meat, fried  foods, and processed meats, such as sausage and lunch meats.   Eat lots of fresh fruits and vegetables.  Choose whole grains, beans, pasta, potatoes, and cereals.   Use only small amounts of olive, corn, or canola oils.   Avoid butter, mayonnaise, shortening, or palm kernel oils.  Avoid foods with trans fats.   Drink skim or nonfat milk and eat low-fat or nonfat yogurt and cheeses. Avoid whole milk, cream, ice cream, egg yolks, and full-fat cheeses.   Healthy desserts include angel food cake, ginger snaps, animal crackers, hard candy, popsicles, and low-fat or nonfat frozen yogurt. Avoid pastries, cakes, pies, and cookies.   Exercise. Follow your exercise programs as directed by your health care provider.   A regular program helps decrease LDL and raise HDL.   A regular program helps with weight control.   Do things that increase your activity level like gardening, walking, or taking the stairs. Ask your health care provider about how you can be more active in your daily life.   Medicine. Take medicine only as directed by your health care provider.   Medicine may be prescribed by your health care provider to help lower cholesterol and decrease the risk for heart disease.   If you have several risk factors, you may need medicine even if your levels are normal.   This information is not intended to replace advice given to you  by your health care provider. Make sure you discuss any questions you have with your health care provider.   Document Released: 07/10/2001 Document Revised: 11/05/2014 Document Reviewed: 07/29/2013 Elsevier Interactive Patient Education [employer].

## 2015-09-10 NOTE — Assessment & Plan Note (Signed)
Mildly hypervolemic with LE edema today Refilled lasix

## 2015-09-10 NOTE — Assessment & Plan Note (Signed)
Likely contributing to fatigue Scheduled for sleep study

## 2015-09-10 NOTE — Assessment & Plan Note (Addendum)
Start simvastatin for primary prevention Screening A1c

## 2015-09-10 NOTE — Assessment & Plan Note (Signed)
Slightly improved with vit D supplementation Likely multifactorial (psych, g1DD, vit D deficinecy, need for CPAP titration) Continue to monitor with above interventions Repeat Vit D level in 4-6 weeks to determine need for further supplementaiton

## 2015-09-12 ENCOUNTER — Encounter: Payer: Self-pay | Admitting: Family Medicine

## 2015-09-27 ENCOUNTER — Ambulatory Visit (INDEPENDENT_AMBULATORY_CARE_PROVIDER_SITE_OTHER): Payer: 59 | Admitting: Internal Medicine

## 2015-09-27 ENCOUNTER — Encounter: Payer: Self-pay | Admitting: Internal Medicine

## 2015-09-27 VITALS — BP 118/64 | HR 79 | Ht 61.0 in | Wt 200.2 lb

## 2015-09-27 DIAGNOSIS — J0191 Acute recurrent sinusitis, unspecified: Secondary | ICD-10-CM | POA: Diagnosis not present

## 2015-09-27 DIAGNOSIS — J329 Chronic sinusitis, unspecified: Secondary | ICD-10-CM | POA: Diagnosis not present

## 2015-09-27 DIAGNOSIS — J4550 Severe persistent asthma, uncomplicated: Secondary | ICD-10-CM

## 2015-09-27 MED ORDER — CLARITHROMYCIN 500 MG PO TABS: 500.0000 mg | ORAL_TABLET | Freq: Two times a day (BID) | ORAL | Status: DC

## 2015-09-27 NOTE — Progress Notes (Signed)
MRN# [id number] [name] 01-Jun-1966   CC: Chief Complaint  Patient presents with  . Follow-up    pt. states asthma is up and down but has improved in the last week. occ. SOB. occ. wheezing. prod. cough yellow in color x2wk. denies chest pain/tightness. seen pcp 11/11.      Brief History: 49 year old female seen in consultation today for asthma/COPD optimization. Patient states she was diagnosed with asthma in 2004 after having recurrent bronchitis and pneumonias at that time, walking pneumonia. She has spirometry done that showed obstructive airway disease and was placed on, patient of several different inhalers over the past couple of years which include Spiriva, Serevent, Flovent, albuterol, Symbicort; 2 weeks ago she had another asthma flareup was placed on steroids and Symbicort was changed to [facility].  She states that about 12 years ago she started having symptoms of recurrent cough, recurrent upper respiratory tract infections, bronchospasms, nasal congestion, chest congestion and at that time it was determined that she was having symptoms consistent with asthma; she was placed on inhalers which greatly improved her symptoms at that time. About 4 months ago she started to have similar problems again and for this year has had 3 visits to the ED each time was placed on prednisone taper and one time on a Z-Pak. She states that she is using her albuterol inhaler daily twice a day, and her albuterol nebulizer a few nights per week. Patient also has a history of obstructive sleep apnea diagnosed 3 years ago, wearing his CPAP every night, she is followed by her primary care physician for sleep apnea. Serevent and Flovent in the past she had worsening cough, and has since been taken off these meds. Patient states since starting on Dulera her symptoms have improved but she still endorses some dyspnea on exertion, and fatigue and still with use of albuterol rescue inhaler. In the past she was  using Spiriva with great control of asthma, but her physicians took her off and change her to other inhalers. Patient does have a history of prior tobacco use quit in 2004, previously smoked half a pack to one pack per day for 23 years Patient states she also has a history of allergies, and is currently on Zyrtec, Singulair, Flonase, which she uses on a regular basis. Patient lives in a home which is about 48 years old, she has no pets, no exposure to mold and mildew. She she works as a Designer, multimedia at [employer].  Asthma Patient with known history of asthma since 2004 have to recurrent bronchitis infections. Her asthma is currently on control. Triggers at this time could be weather changes and allergies. I suspect that prolonged smoking of history combination with asthma-also showing up as clinically COPD. Differential diagnosis at this time for her current symptoms his asthma in combination with COPD. Further history reveals that she has moderate relief with oral prednisone and with Spiriva. These findings especially with Spiriva can be seen and COPD patient's.  Plan: -Pulmonary function testing and 6 minute walk test prior to follow-up. -Spiriva Respimat 2.5 g, 2 puffs in the morning gargle and rinse after each use -Continue with [facility] -Discussed importance of rescue medication and appropriate usefulness of it, and Spiriva use continues patient is advised to wean down on her use of rescue inhalers and nebulizers. -Prescription for AeroChamber also given to using inhalers Events since last clinic visit: Presents today for a follow up of asthma Since last visit her PMD has started her on  lasix, due to fluid retention and grade I diastolic dysfcn on ECHO - she states that this has helped her overall pulmonary status Starting 2 weeks ago  had another episode of cough, congestion, sinus type symptoms - but now starting to feel better Overall feels her pulmonary status is slowly improving.  Has a  follow up sleep study scheduled for Jan 2017, this is a recheck.  Patient continues to have recurrent sinus infections that requires intermittent doses of antibiotics and/or steroids. Today she states she's been using her albuterol about 2-3 times per day for the last week also , but this is improved over the last 1-2 days. Patient states when she is put on prednisone it does not provide any great relief in her breathing status.  Medication:   Current Outpatient Rx  Name  Route  Sig  Dispense  Refill  . albuterol (PROVENTIL HFA;VENTOLIN HFA) 108 (90 BASE) MCG/ACT inhaler      2 puffs every 3-4 hours as needed for shortness of breath\wheezing\recurrent cough   1 Inhaler   6   . albuterol (PROVENTIL) (2.5 MG/3ML) 0.083% nebulizer solution   Nebulization   Take 3 mLs (2.5 mg total) by nebulization every 4 (four) hours as needed.   75 mL   6   . baclofen (LIORESAL) 10 MG tablet   Oral   Take 10 mg by mouth 4 (four) times daily as needed (headaches).          Marland Kitchen BIOTIN PO   Oral   Take 2,000 mg by mouth daily.         . cetirizine (ZYRTEC) 10 MG tablet   Oral   Take 1 tablet (10 mg total) by mouth daily.   30 tablet   11   . Cholecalciferol 50000 UNITS TABS   Oral   Take 1 tablet by mouth once a week. For 8 weeks   8 tablet   0   . citalopram (CELEXA) 40 MG tablet   Oral   Take 40 mg by mouth daily.         . cyclobenzaprine (FLEXERIL) 10 MG tablet   Oral   Take 0.5 tablets (5 mg total) by mouth 3 (three) times daily as needed for muscle spasms.   30 tablet   0   . esomeprazole (NEXIUM) 40 MG capsule   Oral   Take 40 mg by mouth daily.          Marland Kitchen estradiol (ESTRACE) 0.1 MG/GM vaginal cream      Apply 1 gram per vagina every night for 2 weeks, then apply three times a week   30 g   12   . estrogen, conjugated,-medroxyprogesterone (PREMPRO) 0.3-1.5 MG per tablet   Oral   Take 1 tablet by mouth daily.   30 tablet   12   . fluticasone (FLONASE) 50  MCG/ACT nasal spray      PLACE 2 SPRAYS INTO THE NOSE DAILY.   16 g   6   . furosemide (LASIX) 20 MG tablet   Oral   Take 1 tablet (20 mg total) by mouth daily.   30 tablet   3   . ibuprofen (ADVIL,MOTRIN) 600 MG tablet   Oral   Take 1 tablet (600 mg total) by mouth 3 (three) times daily.   20 tablet   0   . ibuprofen (ADVIL,MOTRIN) 600 MG tablet   Oral   Take 1 tablet (600 mg total) by mouth every 6 (six) hours  as needed.   30 tablet   0   . mometasone-formoterol (DULERA) 200-5 MCG/ACT AERO   Inhalation   Inhale 2 puffs into the lungs 2 (two) times daily.   8.8 g   1   . montelukast (SINGULAIR) 10 MG tablet   Oral   Take 1 tablet (10 mg total) by mouth at bedtime.   90 tablet   3   . oxybutynin (DITROPAN-XL) 10 MG 24 hr tablet   Oral   Take 1 tablet (10 mg total) by mouth at bedtime.   30 tablet   0   . predniSONE (DELTASONE) 20 MG tablet   Oral   Take 1 tablet (20 mg total) by mouth daily.   5 tablet   0   . propranolol ER (INDERAL LA) 80 MG 24 hr capsule   Oral   Take 1 capsule (80 mg total) by mouth daily.   30 capsule   2   . Pyridoxine HCl (VITAMIN B-6 PO)   Oral   Take 1 tablet by mouth daily. Unknown OTC strength         . simvastatin (ZOCOR) 40 MG tablet   Oral   Take 1 tablet (40 mg total) by mouth at bedtime.   90 tablet   3   . Spacer/Aero-Holding Chambers (AEROCHAMBER MV) inhaler      Use as instructed   1 each   0   . Tiotropium Bromide Monohydrate (SPIRIVA RESPIMAT) 2.5 MCG/ACT AERS   Inhalation   Inhale 2 puffs into the lungs daily.   4 g   3   . traZODone (DESYREL) 50 MG tablet   Oral   Take 50 mg by mouth at bedtime as needed for sleep.         . verapamil (CALAN) 40 MG tablet   Oral   Take 40 mg by mouth daily. For palpitations/arrhythmias         . vitamin C (ASCORBIC ACID) 500 MG tablet   Oral   Take 1,000 mg by mouth daily.         . clarithromycin (BIAXIN) 500 MG tablet   Oral   Take 1 tablet (500  mg total) by mouth 2 (two) times daily.   28 tablet   0      Review of Systems: Gen:  Denies  fever, sweats, chills HEENT: Denies blurred vision, double vision, ear pain, eye pain. Admits to sore throat, nasal drainage/congestion Cvc:  No dizziness, chest pain or heaviness Resp:   Admits to: cough, sinus congestion,  Gi: Denies swallowing difficulty, stomach pain, nausea or vomiting, diarrhea, constipation, bowel incontinence Gu:  Denies bladder incontinence, burning urine Ext:   No Joint pain, stiffness or swelling Skin: No skin rash, easy bruising or bleeding or hives Endoc:  No polyuria, polydipsia , polyphagia or weight change Other:  All other systems negative  Allergies:  Amitriptyline; Fluticasone-salmeterol; Penicillins; Ciprofloxacin; Macrobid; and Sulfamethoxazole-trimethoprim  Physical Examination:  VS: BP 118/64 mmHg  Pulse 79  Ht 5\' 1"  (1.549 m)  Wt 200 lb 3.2 oz (90.81 kg)  BMI 37.85 kg/m2  SpO2 97%  LMP 12/30/2014 (Exact Date)  General Appearance: No distress  HEENT: PERRLA, no ptosis, no other lesions noticed. Mild Erythema of oropharynx Pulmonary:normal breath sounds., diaphragmatic excursion normal.No wheezing, No rales   Cardiovascular:  Normal S1,S2.  No m/r/g.     Abdomen:Exam: Benign, Soft, non-tender, No masses  Skin:   warm, no rashes, no ecchymosis  Extremities: normal, no  cyanosis, clubbing, warm with normal capillary refill.      6 minute walk tests 06/02/2015 -Distance 974 feet/297 m, low saturation 96%, highest heart rate 75. Pulmonary function testing FEV1 87% FEV1/FVC 93% ERV 21% DLCO uncorrected 71% Impression: No significant obstruction, restriction, no diffusion abnormality, severe reduction in ERV could be related to obesity (central obesity), clinical correlation advised.  (The following images and results were reviewed by Dr. Stevenson Clinch on 09/28/2015).  CXR 08/17/15 The heart size and mediastinal contours are within normal  limits. Both lungs are clear. The visualized skeletal structures are unremarkable.  IMPRESSION: No active cardiopulmonary disease.    ECHO 06/2015 Study Conclusions  - Left ventricle: The cavity size was normal. Wall thickness was increased in a pattern of mild LVH. Systolic function was vigorous. The estimated ejection fraction was in the range of 65% to 70%. Wall motion was normal; there were no regional wall motion abnormalities. Doppler parameters are consistent with abnormal left ventricular relaxation (grade 1 diastolic dysfunction). - Mitral valve: There was trivial regurgitation. - Right ventricle: The cavity size was normal. Wall thickness was normal. Systolic function was normal. - Inferior vena cava: The vessel was dilated. The respirophasic diameter changes were blunted (< 50%), consistent with elevated central venous pressure.  Assessment and Plan: Acute sinusitis Recurrent Sinusitis  Now with another infection  These recurrent sinusitis\nasal drainage episodes are impacting her asthma - will require further evaluation by ENT  Plan - Biaxin 500 BID x 2 week - ENT referral for recurrent sinusitis   Asthma Patient with known history of asthma since 2004 have to recurrent bronchitis infections. Her asthma  Can be under better control, her recurrent episodes of sinusitis/nasal congestion/drainage is impacting on her asthma. In addition, grade 1 diastolic heart dysfunction it is also causing some cardiac wheeze and fluid retention. Overall, Lasix has helped her breathing, her inhalers for asthma are providing maximal support , but control of her recurrent sinusitis /chronic sinusitis will be advantageous in controlling her asthma also. Triggers at this time could be weather changes and allergies.  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   Plan: - Continue with  Spiriva Respimat 2.5 g, dulera, and rescue inhaler - Having symptoms of acute sinusitis, Levaquin and prednisone prescribe along with nasal saline rinse - Continue with recurrent allergy regiment - lasix by PMD for Grade I diastolic dysfunction - ENT referral       Updated Medication List Outpatient Encounter Prescriptions as of 09/27/2015  Medication Sig  . albuterol (PROVENTIL HFA;VENTOLIN HFA) 108 (90 BASE) MCG/ACT inhaler 2 puffs every 3-4 hours as needed for shortness of breath\wheezing\recurrent cough  . albuterol (PROVENTIL) (2.5 MG/3ML) 0.083% nebulizer solution Take 3 mLs (2.5 mg total) by nebulization every 4 (four) hours as needed.  . baclofen (LIORESAL) 10 MG tablet Take 10 mg by mouth 4 (four) times daily as needed (headaches).   Marland Kitchen BIOTIN PO Take 2,000 mg by mouth daily.  . cetirizine (ZYRTEC) 10 MG tablet Take 1 tablet (10 mg total) by mouth daily.  . Cholecalciferol 50000 UNITS TABS Take 1 tablet by mouth once a week. For 8 weeks  . citalopram (CELEXA) 40 MG tablet Take 40 mg by mouth daily.  . cyclobenzaprine (FLEXERIL) 10 MG tablet Take 0.5 tablets (5 mg total) by mouth 3 (three) times daily as needed for muscle spasms.  Marland Kitchen esomeprazole (NEXIUM) 40 MG capsule Take 40 mg by mouth  daily.   . estradiol (ESTRACE) 0.1 MG/GM vaginal cream Apply 1 gram per vagina every night for 2 weeks, then apply three times a week  . estrogen, conjugated,-medroxyprogesterone (PREMPRO) 0.3-1.5 MG per tablet Take 1 tablet by mouth daily.  . fluticasone (FLONASE) 50 MCG/ACT nasal spray PLACE 2 SPRAYS INTO THE NOSE DAILY.  . furosemide (LASIX) 20 MG tablet Take 1 tablet (20 mg total) by mouth daily.  Marland Kitchen ibuprofen (ADVIL,MOTRIN) 600 MG tablet Take 1 tablet (600 mg total) by mouth 3 (three) times daily.  Marland Kitchen ibuprofen (ADVIL,MOTRIN) 600 MG tablet Take 1 tablet (600 mg total) by mouth every 6 (six) hours as needed.  . mometasone-formoterol (DULERA) 200-5 MCG/ACT AERO Inhale 2 puffs into the lungs 2  (two) times daily.  . montelukast (SINGULAIR) 10 MG tablet Take 1 tablet (10 mg total) by mouth at bedtime.  Marland Kitchen oxybutynin (DITROPAN-XL) 10 MG 24 hr tablet Take 1 tablet (10 mg total) by mouth at bedtime.  . predniSONE (DELTASONE) 20 MG tablet Take 1 tablet (20 mg total) by mouth daily.  . propranolol ER (INDERAL LA) 80 MG 24 hr capsule Take 1 capsule (80 mg total) by mouth daily.  . Pyridoxine HCl (VITAMIN B-6 PO) Take 1 tablet by mouth daily. Unknown OTC strength  . simvastatin (ZOCOR) 40 MG tablet Take 1 tablet (40 mg total) by mouth at bedtime.  Marland Kitchen Spacer/Aero-Holding Chambers (AEROCHAMBER MV) inhaler Use as instructed  . Tiotropium Bromide Monohydrate (SPIRIVA RESPIMAT) 2.5 MCG/ACT AERS Inhale 2 puffs into the lungs daily.  . traZODone (DESYREL) 50 MG tablet Take 50 mg by mouth at bedtime as needed for sleep.  . verapamil (CALAN) 40 MG tablet Take 40 mg by mouth daily. For palpitations/arrhythmias  . vitamin C (ASCORBIC ACID) 500 MG tablet Take 1,000 mg by mouth daily.  . clarithromycin (BIAXIN) 500 MG tablet Take 1 tablet (500 mg total) by mouth 2 (two) times daily.   No facility-administered encounter medications on file as of 09/27/2015.    Orders for this visit: Orders Placed This Encounter  Procedures  . Ambulatory referral to ENT    Referral Priority:  Routine    Referral Type:  Consultation    Referral Reason:  Specialty Services Required    Requested Specialty:  Otolaryngology    Number of Visits Requested:  1    Thank  you for the visitation and for allowing  [facility] to assist in the care of your patient. Our recommendations are noted above.  Please contact us if we can be of further service.  Vilinda Boehringer, MD [facility] Office Number: [phone]

## 2015-09-27 NOTE — Assessment & Plan Note (Signed)
Recurrent Sinusitis  Now with another infection  These recurrent sinusitis\nasal drainage episodes are impacting her asthma - will require further evaluation by ENT  Plan - Biaxin 500 BID x 2 week - ENT referral for recurrent sinusitis

## 2015-09-27 NOTE — Patient Instructions (Signed)
Follow up with Dr. Stevenson Clinch in 3 months  - cont with current Asthma meds - Biaxin 500mg , 1 tab PO BID x 14 days.  - we will refer you to ENT in [facility] for Chronic Sinusitis  - cont with lasix  - albuterol PRN

## 2015-09-27 NOTE — Assessment & Plan Note (Addendum)
Patient with known history of asthma since 2004 have to recurrent bronchitis infections. Her asthma  Can be under better control, her recurrent episodes of sinusitis/nasal congestion/drainage is impacting on her asthma. In addition, grade 1 diastolic heart dysfunction it is also causing some cardiac wheeze and fluid retention. Overall, Lasix has helped her breathing, her inhalers for asthma are providing maximal support , but control of her recurrent sinusitis /chronic sinusitis will be advantageous in controlling her asthma also. Triggers at this time could be weather changes and allergies.  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   Plan: - Continue with Spiriva Respimat 2.5 g, dulera, and rescue inhaler - Having symptoms of acute sinusitis, Levaquin and prednisone prescribe along with nasal saline rinse - Continue with recurrent allergy regiment - lasix by PMD for Grade I diastolic dysfunction - ENT referral

## 2015-10-03 ENCOUNTER — Encounter: Payer: Self-pay | Admitting: Family Medicine

## 2015-10-03 ENCOUNTER — Other Ambulatory Visit: Payer: Self-pay | Admitting: Family Medicine

## 2015-10-03 ENCOUNTER — Ambulatory Visit (INDEPENDENT_AMBULATORY_CARE_PROVIDER_SITE_OTHER): Payer: 59 | Admitting: Family Medicine

## 2015-10-03 VITALS — BP 160/96 | HR 71 | Temp 98.4°F | Ht 61.0 in | Wt 197.1 lb

## 2015-10-03 DIAGNOSIS — E559 Vitamin D deficiency, unspecified: Secondary | ICD-10-CM | POA: Diagnosis not present

## 2015-10-03 DIAGNOSIS — J0191 Acute recurrent sinusitis, unspecified: Secondary | ICD-10-CM | POA: Diagnosis not present

## 2015-10-03 DIAGNOSIS — R7303 Prediabetes: Secondary | ICD-10-CM

## 2015-10-03 DIAGNOSIS — I10 Essential (primary) hypertension: Secondary | ICD-10-CM | POA: Insufficient documentation

## 2015-10-03 DIAGNOSIS — E1165 Type 2 diabetes mellitus with hyperglycemia: Secondary | ICD-10-CM | POA: Insufficient documentation

## 2015-10-03 DIAGNOSIS — R030 Elevated blood-pressure reading, without diagnosis of hypertension: Secondary | ICD-10-CM

## 2015-10-03 DIAGNOSIS — IMO0001 Reserved for inherently not codable concepts without codable children: Secondary | ICD-10-CM

## 2015-10-03 MED ORDER — ESOMEPRAZOLE MAGNESIUM 40 MG PO CPDR: 40.0000 mg | DELAYED_RELEASE_CAPSULE | Freq: Every day | ORAL | Status: DC

## 2015-10-03 NOTE — Assessment & Plan Note (Signed)
History and exam consistent with acute sinusitis with eustachian tube dysfunction causing ear discomfort Patient has history of recurrent sinusitis Encouraged patient to take Biaxin as prescribed by pulmonologist Encouraged patient to follow-up with ENT as referred by pulmonologist Follow-up precautions given

## 2015-10-03 NOTE — Assessment & Plan Note (Signed)
Blood pressure elevated today on initial check and on manual repeat Patient does report severe headache during visit, which is likely contributing Follow-up at next visit

## 2015-10-03 NOTE — Assessment & Plan Note (Signed)
Repeat vitamin D level today Supplement as necessary pending lab results

## 2015-10-03 NOTE — Assessment & Plan Note (Signed)
Discussed the meaning of prediabetes Discussed the importance of diet and exercise in preventing progression to diabetes in the future

## 2015-10-03 NOTE — Patient Instructions (Signed)
Nice to see you again today.  We are getting a Vitamin D level today and someone will call you or send you a letter with the results when they are available.    Take the antibiotic for your sinuses.  This should help the ear pain too.  I'll see you again in March to follow-up on fatigue and sleep study.  Take care, Dr. Jacinto Reap

## 2015-10-03 NOTE — Progress Notes (Signed)
   Subjective:   [name] is a 49 y.o. female with a history of migraine, chronic recurrent sinusitis, fatigue, vit D deficiency here for pre-diabetes, R ear pain, and Vit D deficiency f/u.  Pre-diabetes - patient reports that it has been in pre-diabetic range previously - thinks she is eating poorly because she is eating cafeteria food - is not exercising, but wants to get back into that  Vit D deficiency - finished last dose of Vit D supplement on Sunday  R ear pain - had sinus pressure and ear pain for 2-3 weeks - was prescribed abx for sinus infection by Dr. Stevenson Clinch - she was not able to pick up abx until today - also has intermittent head pressure and headache  Review of Systems:  Per HPI. All other systems reviewed and are negative.   PMH, PSH, Medications, Allergies, and FmHx reviewed and updated in EMR.  Social History: former smoker  Objective:  BP 160/96 mmHg  Pulse 71  Temp(Src) 98.4 F (36.9 C) (Oral)  Ht 5\' 1"  (1.549 m)  Wt 197 lb 1.6 oz (89.404 kg)  BMI 37.26 kg/m2  LMP 12/30/2014 (Exact Date)  Gen:  49 y.o. female in NAD  HEENT: NCAT, MMM, EOMI, PERRL, anicteric sclerae, OP clear, TMs clear bilaterally CV: RRR, no MRG Resp: Non-labored, CTAB, no wheezes noted Ext: WWP, no edema MSK: No obvious deformities Neuro: Alert and oriented, speech normal    Assessment & Plan:     [name] is a 49 y.o. female here for:  Acute sinusitis History and exam consistent with acute sinusitis with eustachian tube dysfunction causing ear discomfort Patient has history of recurrent sinusitis Encouraged patient to take Biaxin as prescribed by pulmonologist Encouraged patient to follow-up with ENT as referred by pulmonologist Follow-up precautions given  Vitamin D deficiency Repeat vitamin D level today Supplement as necessary pending lab results  Pre-diabetes Discussed the meaning of prediabetes Discussed the importance of diet and exercise in preventing  progression to diabetes in the future  Elevated BP Blood pressure elevated today on initial check and on manual repeat Patient does report severe headache during visit, which is likely contributing Follow-up at next visit    Virginia Crews, MD MPH PGY-2,  [facility] 10/03/2015  10:00 PM

## 2015-10-03 NOTE — Telephone Encounter (Signed)
This is your pt for med refill

## 2015-10-04 LAB — VITAMIN D 25 HYDROXY (VIT D DEFICIENCY, FRACTURES): VIT D 25 HYDROXY: 41 ng/mL (ref 30–100)

## 2015-10-05 ENCOUNTER — Encounter: Payer: Self-pay | Admitting: Family Medicine

## 2015-11-06 ENCOUNTER — Encounter (HOSPITAL_BASED_OUTPATIENT_CLINIC_OR_DEPARTMENT_OTHER): Payer: Self-pay

## 2015-11-14 ENCOUNTER — Other Ambulatory Visit: Payer: Self-pay

## 2015-11-14 DIAGNOSIS — Z1231 Encounter for screening mammogram for malignant neoplasm of breast: Secondary | ICD-10-CM

## 2015-11-19 DIAGNOSIS — H5213 Myopia, bilateral: Secondary | ICD-10-CM | POA: Diagnosis not present

## 2015-11-28 ENCOUNTER — Ambulatory Visit
Admission: RE | Admit: 2015-11-28 | Discharge: 2015-11-28 | Disposition: A | Discharge status: Home / Self Care | Payer: 59 | Source: Ambulatory Visit | Attending: Family Medicine | Admitting: Family Medicine

## 2015-11-28 ENCOUNTER — Ambulatory Visit (INDEPENDENT_AMBULATORY_CARE_PROVIDER_SITE_OTHER): Payer: 59 | Admitting: Family Medicine

## 2015-11-28 ENCOUNTER — Ambulatory Visit (HOSPITAL_COMMUNITY)
Admission: RE | Admit: 2015-11-28 | Discharge: 2015-11-28 | Disposition: A | Discharge status: Home / Self Care | Payer: 59 | Source: Ambulatory Visit | Attending: Family Medicine | Admitting: Family Medicine

## 2015-11-28 ENCOUNTER — Telehealth: Payer: Self-pay | Admitting: Family Medicine

## 2015-11-28 ENCOUNTER — Encounter: Payer: Self-pay | Admitting: Family Medicine

## 2015-11-28 VITALS — BP 128/65 | HR 70 | Temp 98.4°F | Wt 196.0 lb

## 2015-11-28 DIAGNOSIS — R0781 Pleurodynia: Secondary | ICD-10-CM | POA: Diagnosis not present

## 2015-11-28 DIAGNOSIS — S76011A Strain of muscle, fascia and tendon of right hip, initial encounter: Secondary | ICD-10-CM

## 2015-11-28 DIAGNOSIS — R079 Chest pain, unspecified: Secondary | ICD-10-CM | POA: Diagnosis not present

## 2015-11-28 LAB — CBC WITH DIFFERENTIAL/PLATELET
Basophils Absolute: 0 10*3/uL (ref 0.0–0.1)
Basophils Relative: 1 %
EOS ABS: 0.1 10*3/uL (ref 0.0–0.7)
Eosinophils Relative: 1 %
HEMATOCRIT: 38.4 % (ref 36.0–46.0)
HEMOGLOBIN: 13 g/dL (ref 12.0–15.0)
LYMPHS ABS: 1.4 10*3/uL (ref 0.7–4.0)
LYMPHS PCT: 33 %
MCH: 31.8 pg (ref 26.0–34.0)
MCHC: 33.9 g/dL (ref 30.0–36.0)
MCV: 93.9 fL (ref 78.0–100.0)
MONOS PCT: 12 %
Monocytes Absolute: 0.5 10*3/uL (ref 0.1–1.0)
NEUTROS PCT: 53 %
Neutro Abs: 2.2 10*3/uL (ref 1.7–7.7)
Platelets: 331 10*3/uL (ref 150–400)
RBC: 4.09 MIL/uL (ref 3.87–5.11)
RDW: 12.8 % (ref 11.5–15.5)
WBC: 4.2 10*3/uL (ref 4.0–10.5)

## 2015-11-28 LAB — COMPREHENSIVE METABOLIC PANEL
ALT: 17 U/L (ref 14–54)
AST: 16 U/L (ref 15–41)
Albumin: 4.1 g/dL (ref 3.5–5.0)
Alkaline Phosphatase: 33 U/L — ABNORMAL LOW (ref 38–126)
Anion gap: 12 (ref 5–15)
BUN: 9 mg/dL (ref 6–20)
CHLORIDE: 101 mmol/L (ref 101–111)
CO2: 29 mmol/L (ref 22–32)
CREATININE: 0.92 mg/dL (ref 0.44–1.00)
Calcium: 9.7 mg/dL (ref 8.9–10.3)
GFR calc non Af Amer: >60 mL/min (ref >60)
Glucose, Bld: 94 mg/dL (ref 65–99)
POTASSIUM: 4.1 mmol/L (ref 3.5–5.1)
SODIUM: 142 mmol/L (ref 135–145)
Total Bilirubin: 0.4 mg/dL (ref 0.3–1.2)
Total Protein: 7.1 g/dL (ref 6.5–8.1)

## 2015-11-28 LAB — D-DIMER, QUANTITATIVE: D-Dimer, Quant: 0.36 ug/mL-FEU (ref 0.00–0.50)

## 2015-11-28 LAB — BRAIN NATRIURETIC PEPTIDE: B NATRIURETIC PEPTIDE 5: 19.8 pg/mL (ref 0.0–100.0)

## 2015-11-28 MED ORDER — CYCLOBENZAPRINE HCL 10 MG PO TABS: 5.0000 mg | ORAL_TABLET | Freq: Three times a day (TID) | ORAL | Status: DC | PRN

## 2015-11-28 MED FILL — DULERA 200 MCG/5 MCG INH: 200-5 | 30 days supply | Qty: 13 | Fill #1

## 2015-11-28 MED FILL — VENTOLIN HFA 90 MCG INHALER: 108 (90 BAS | 30 days supply | Qty: 18 | Fill #1

## 2015-11-28 MED FILL — AZELASTINE HCL 137 MCG SPRY: 0.1 | 30 days supply | Qty: 30 | Fill #1

## 2015-11-28 MED FILL — PREMPRO 0.3 MG-1.5 MG TAB: 0.3-1.5 | 28 days supply | Qty: 28 | Fill #7

## 2015-11-28 MED FILL — PROPRANOLOL ER 80 MG CAP: 80 | 30 days supply | Qty: 30 | Fill #1

## 2015-11-28 MED FILL — MOMETASONE FUROATE 0.1% SOL: 0.1 | 15 days supply | Qty: 30 | Fill #1

## 2015-11-28 MED FILL — ALL DAY ALLERGY 10 MG TAB: 10 | 100 days supply | Qty: 100 | Fill #0

## 2015-11-28 MED FILL — ESOMEPRAZOLE MAG DR 40 MG C: 40 | 30 days supply | Qty: 30 | Fill #1

## 2015-11-28 MED FILL — SPIRIVA RESPIMAT INHAL SPRY: 2.5 | 30 days supply | Qty: 4 | Fill #1

## 2015-11-28 MED FILL — SIMVASTATIN 40 MG TABLET: 40 | 90 days supply | Qty: 90 | Fill #1

## 2015-11-28 MED FILL — FUROSEMIDE 20 MG TABLET: 20 | 30 days supply | Qty: 30 | Fill #1

## 2015-11-28 NOTE — Telephone Encounter (Signed)
Called patient to discuss labs and chestxray from this morning. All negative. Very low suspicion for PE, pneumonia, CHF, etc. Recommend trial of muscle relaxers for possible muscle pain rx for flexeril sent in Patient appreciative  Leeanne Rio, MD

## 2015-11-28 NOTE — Progress Notes (Signed)
Date of Visit: 11/28/2015   HPI:  Patient presents for a same day appointment to discuss pain with breathing.  Since Saturday night, patient has had pain in her epigastric area, radiating to beneath her R breast and around to her back. The pain comes on primarily when she breathes in, especially with taking a deep breath. Had lots of nasal congestion last week and a sinus headache then. Has been coughing up clear/yellow sputum. No blood in sputum. No fevers. No nausea or diarrhea. Bowels are moving well. Thought initially the pain was acid reflux or gas but it's not improved. Took exlax sat PM without relief. Drinking lots of water but does have decreased appetite. She takes lasix chronically for diastolic CHF, states this has been helping a lot. Has had occasional post tussive emesis, otherwise no vomiting. Denies dizziness, fever, chest pain, nausea, abdominal pain. States very clearly that pain does NOT go directly through her chest, but rather wraps around on the outside.  ROS: See HPI  [place]: history of chronic recurrent sinusitis, prediabetes, vit D def, asthma, chronic diastolic CHF, hyperlipidemia, OSA, depression & anxiety  PHYSICAL EXAM: BP 128/65 mmHg  Pulse 70  Temp(Src) 98.4 F (36.9 C) (Oral)  Wt 196 lb (88.905 kg)  SpO2 97%  LMP 12/30/2014 (Exact Date) Gen: NAD, pleasant, cooperative HEENT: normocephalic, atraumatic, moist mucous membranes, oropharynx clear and moist. Tympanic membranes clear bilaterally. Ear canals normal bilaterally  Heart: regular rate and rhythm, no murmur Lungs: clear to auscultation bilaterally, normal work of breathing, no crackles or wheezes, speaks in full sentences without distress Abdomen: soft nontender to palpation, no masses or organomegaly Neuro: alert, grossly nonfocal, speech normal Extremities: No appreciable lower extremity edema bilaterally  Skin: no skin lesions seen on R flank in area of pain, area nontender to  palpation  ASSESSMENT/PLAN:  50 yo F with history of asthma, chronic diastolic CHF, hyperlipidemia presenting with epigastric pain radiating around to R side and back. Differential diagnosis includes pneumonia, pleural effusion, PE, musculoskeletal pain, among other things. O2 sat and vitals are unremarkable. EKG unchanged from prior. No clear etiology on exam. Doubt aortic dissection given description, also normal appearing aorta in Sept 2015 on CTA. Plan: - stat labs: BNP, CBC with diff, CMET, d dimer - CXR now - I will call patient once I have her labs back. If d-dimer is elevated, she will need either outpatient CTA chest, or will need to go to ED.  - follow up later this week for reassessment - discussed reasons which should prompt going to ED immediately. - if above workup unremarkable will do trial of muscle relaxers & heating pad  FOLLOW UP: Follow up later this week for repeat assessment for above issues.  Alsen. Ardelia Mems, [place]

## 2015-11-28 NOTE — Patient Instructions (Addendum)
Checking labwork and chest xray today. I will call you if d-dimer is elevated as that means we will need you to go to ED or set up for outpatient CT scan  Come back later this week to be reassessed If worsening, fevers, chest pain, shortness of breath please go to ER  Be well, Dr. Ardelia Mems

## 2015-11-30 ENCOUNTER — Ambulatory Visit: Payer: Self-pay | Admitting: Family Medicine

## 2015-12-02 ENCOUNTER — Ambulatory Visit (HOSPITAL_BASED_OUTPATIENT_CLINIC_OR_DEPARTMENT_OTHER): Payer: 59 | Attending: Family Medicine

## 2015-12-02 DIAGNOSIS — R5383 Other fatigue: Secondary | ICD-10-CM | POA: Diagnosis not present

## 2015-12-02 DIAGNOSIS — Z79899 Other long term (current) drug therapy: Secondary | ICD-10-CM | POA: Insufficient documentation

## 2015-12-02 DIAGNOSIS — R0683 Snoring: Secondary | ICD-10-CM | POA: Insufficient documentation

## 2015-12-02 DIAGNOSIS — G4733 Obstructive sleep apnea (adult) (pediatric): Secondary | ICD-10-CM | POA: Insufficient documentation

## 2015-12-02 DIAGNOSIS — I10 Essential (primary) hypertension: Secondary | ICD-10-CM | POA: Insufficient documentation

## 2015-12-04 DIAGNOSIS — G4733 Obstructive sleep apnea (adult) (pediatric): Secondary | ICD-10-CM | POA: Diagnosis not present

## 2015-12-04 NOTE — Progress Notes (Signed)
  Patient Name: [name], [name] Date: 12/02/2015 Gender: Female D.O.B: February 26, 1966 Age (years): 62 Referring Provider: Leeanne Rio Height (inches): 61 Interpreting Physician: Baird Lyons MD, ABSM Weight (lbs): 195 RPSGT: Baxter Flattery BMI: 37 MRN: [id number] Neck Size: 15.00 CLINICAL INFORMATION Sleep Study Type: NPSG   Indication for sleep study: Fatigue, Hypertension, OSA, Snoring, Unable to Perform Home Test, Witnessed Apneas   Epworth Sleepiness Score: 13/24   SLEEP STUDY TECHNIQUE As per the AASM Manual for the Scoring of Sleep and Associated Events v2.3 (April 2016) with a hypopnea requiring 4% desaturations. The channels recorded and monitored were frontal, central and occipital EEG, electrooculogram (EOG), submentalis EMG (chin), nasal and oral airflow, thoracic and abdominal wall motion, anterior tibialis EMG, snore microphone, electrocardiogram, and pulse oximetry.  MEDICATIONS Patient's medications include: CHARTED FOR REVIEW Medications self-administered by patient during sleep study :FLUTICASONE, FUROSEMIDE, PREDNISONE, PYRIDOXINE HCL, SIMVASTATIN.   SLEEP ARCHITECTURE The study was initiated at 10:24:39 PM and ended at 4:57:18 AM. Sleep onset time was 0.0 minutes and the sleep efficiency was 98.0%. The total sleep time was 384.6 minutes. Stage REM latency was 39.6 minutes. The patient spent 2.23% of the night in stage N1 sleep, 71.49% in stage N2 sleep, 0.00% in stage N3 and 26.28% in REM. Alpha intrusion was absent. Supine sleep was 11.31%.  RESPIRATORY PARAMETERS The overall apnea/hypopnea index (AHI) was 6.1 per hour. There were 2 total apneas, including 2 obstructive, 0 central and 0 mixed apneas. There were 37 hypopneas and 0 RERAs. The AHI during Stage REM sleep was 8.9 per hour. AHI while supine was 1.4 per hour. The mean oxygen saturation was 94.90%. The minimum SpO2 during sleep was 87.00%. Loud snoring was noted during this  study.  CARDIAC DATA The 2 lead EKG demonstrated sinus rhythm. The mean heart rate was 54.38 beats per minute. Other EKG findings include: None.  LEG MOVEMENT DATA The total PLMS were 82 with a resulting PLMS index of 12.79. Associated arousal with leg movement index was 0.0  . IMPRESSIONS - Mild obstructive sleep apnea occurred during this study (AHI = 6.1/h). - There were not enough early events to meet protocol requirements for split CPAP titration  - No significant central sleep apnea occurred during this study (CAI = 0.0/h). - Mild oxygen desaturation was noted during this study (Min O2 = 87.00%). - The patient snored with Loud snoring volume. - No cardiac abnormalities were noted during this study. - Mild periodic limb movements of sleep occurred during the study. No significant associated arousals.  DIAGNOSIS - Obstructive Sleep Apnea (327.23 [G47.33 ICD-10])  RECOMMENDATIONS - Very mild obstructive sleep apnea. Return to discuss treatment options. - Avoid alcohol, sedatives and other CNS depressants that may worsen sleep apnea and disrupt normal sleep architecture. - Sleep hygiene should be reviewed to assess factors that may improve sleep quality. - Weight management and regular exercise should be initiated or continued if appropriate.  Deneise Lever Diplomate, American Board of Sleep Medicine  ELECTRONICALLY SIGNED ON:  12/04/2015, 10:01 AM [place] PH: [phone]   FX: [phone] [place]

## 2015-12-05 ENCOUNTER — Ambulatory Visit: Payer: Self-pay

## 2015-12-13 ENCOUNTER — Ambulatory Visit
Admission: RE | Admit: 2015-12-13 | Discharge: 2015-12-13 | Disposition: A | Discharge status: Home / Self Care | Payer: 59 | Source: Ambulatory Visit

## 2015-12-13 DIAGNOSIS — Z1231 Encounter for screening mammogram for malignant neoplasm of breast: Secondary | ICD-10-CM | POA: Diagnosis not present

## 2015-12-18 ENCOUNTER — Encounter: Payer: Self-pay | Admitting: Family Medicine

## 2015-12-23 ENCOUNTER — Telehealth: Payer: Self-pay | Admitting: Family Medicine

## 2015-12-23 NOTE — Telephone Encounter (Signed)
Pt is calling to see what her sleep study results were. jw

## 2015-12-26 ENCOUNTER — Ambulatory Visit (INDEPENDENT_AMBULATORY_CARE_PROVIDER_SITE_OTHER): Payer: 59 | Admitting: Internal Medicine

## 2015-12-26 ENCOUNTER — Encounter: Payer: Self-pay | Admitting: Internal Medicine

## 2015-12-26 VITALS — BP 124/70 | HR 64 | Ht 61.0 in | Wt 197.8 lb

## 2015-12-26 DIAGNOSIS — G4733 Obstructive sleep apnea (adult) (pediatric): Secondary | ICD-10-CM

## 2015-12-26 DIAGNOSIS — Z9989 Dependence on other enabling machines and devices: Secondary | ICD-10-CM

## 2015-12-26 DIAGNOSIS — J4540 Moderate persistent asthma, uncomplicated: Secondary | ICD-10-CM

## 2015-12-26 NOTE — Assessment & Plan Note (Signed)
Patient with known history of asthma since 2004 and with recurrent bronchitis infections. Her asthma is under better control, since starting Dulera/Spiriva, and control of her chronic sinusitis issues by ENT. Triggers at this time could be weather changes and allergies.  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 Overall, today patient with good control moderate persistent asthma, currently using albuterol on average twice per week, this could be improve more.  Plan: - Continue with Spiriva Respimat 2.5 g, dulera, and rescue inhaler - Continue with allergy regimen as prescribed by ENT

## 2015-12-26 NOTE — Patient Instructions (Addendum)
Follow up with Dr. Stevenson Clinch in: 4 months -Continue with Ruthe Mannan and Spiriva as prescribed -Albuterol as needed for cough/wheezing/shortness of breath -Continue recommendations by ENT -Continue with allergy control -We will make a referral for you to see Dr. Annamaria Boots to discuss the results of your sleep study. -Continue with diet, exercise, -Continue to monitor salt intake

## 2015-12-26 NOTE — Progress Notes (Signed)
MRN# [id number] [name] [birth year]   CC: Chief Complaint  Patient presents with  . Follow-up    pt. states breathing has worsen. occ. SOB. dry cough while sleeping X2wk. occ. wheezing. denies chest pain/tightness. wears CPAP 8hr everynight. pressure is good. no supplies needed. DME:[facility]      Brief History: 50 year old female seen in consultation today for asthma/COPD optimization. Patient states she was diagnosed with asthma in 2004 after having recurrent bronchitis and pneumonias at that time, walking pneumonia. She has spirometry done that showed obstructive airway disease and was placed on, patient of several different inhalers over the past couple of years which include Spiriva, Serevent, Flovent, albuterol, Symbicort; 2 weeks ago she had another asthma flareup was placed on steroids and Symbicort was changed to [facility].  She states that about 12 years ago she started having symptoms of recurrent cough, recurrent upper respiratory tract infections, bronchospasms, nasal congestion, chest congestion and at that time it was determined that she was having symptoms consistent with asthma; she was placed on inhalers which greatly improved her symptoms at that time. About 4 months ago she started to have similar problems again and for this year has had 3 visits to the ED each time was placed on prednisone taper and one time on a Z-Pak. She states that she is using her albuterol inhaler daily twice a day, and her albuterol nebulizer a few nights per week. Patient also has a history of obstructive sleep apnea diagnosed 3 years ago, wearing his CPAP every night, she is followed by her primary care physician for sleep apnea. Serevent and Flovent in the past she had worsening cough, and has since been taken off these meds. Patient states since starting on Dulera her symptoms have improved but she still endorses some dyspnea on exertion, and fatigue and still with use of albuterol rescue inhaler. In  the past she was using Spiriva with great control of asthma, but her physicians took her off and change her to other inhalers. Patient does have a history of prior tobacco use quit in 2004, previously smoked half a pack to one pack per day for 23 years Patient states she also has a history of allergies, and is currently on Zyrtec, Singulair, Flonase, which she uses on a regular basis. Patient lives in a home which is about 49 years old, she has no pets, no exposure to mold and mildew. She she works as a Designer, multimedia at [employer].  Asthma Patient with known history of asthma since 2004 have to recurrent bronchitis infections. Her asthma is currently on control. Triggers at this time could be weather changes and allergies. I suspect that prolonged smoking of history combination with asthma-also showing up as clinically COPD. Differential diagnosis at this time for her current symptoms his asthma in combination with COPD. Further history reveals that she has moderate relief with oral prednisone and with Spiriva. These findings especially with Spiriva can be seen and COPD patient's.  Plan: -Pulmonary function testing and 6 minute walk test prior to follow-up. -Spiriva Respimat 2.5 g, 2 puffs in the morning gargle and rinse after each use -Continue with [facility] -Discussed importance of rescue medication and appropriate usefulness of it, and Spiriva use continues patient is advised to wean down on her use of rescue inhalers and nebulizers. -Prescription for AeroChamber also given to using inhalers Events since last clinic visit: Presents today for a follow up of asthma Since her last visit, she has been doing overall well. She's  had follow-up with ENT, they started her on eardrops, nasal spray, and nasal ointment. She also had blood allergy testing, which showed positive reaction to pet dander, trees, grass. About 2 weeks ago she was having kidney stones and a suspected UTI along with bladder spasms,  has a standing prescription for Augmentin if needed. About 2 weeks ago also she had increased cough, congestion, sputum production yellowish, she uses albuterol then, since then her symptoms have improved she has mild congestion today, mild cough with clear phlegm. She states on average she uses albuterol about twice a week for cough and shortness of breath. She is also on Lasix which is helping with her leg swelling and with her overall breathing to. Her primary care physician ordered a sleep study for her, this was read by Dr. Annamaria Boots. Patient states previously she was diagnosed with severe sleep apnea and was previously using a CPAP machine, could not recall settings. Her current sleep study shows a mild OSA, AHI 6.5, with recommendation to follow-up with Dr. Annamaria Boots. Medication:   Current Outpatient Rx  Name  Route  Sig  Dispense  Refill  . albuterol (PROVENTIL HFA;VENTOLIN HFA) 108 (90 BASE) MCG/ACT inhaler      2 puffs every 3-4 hours as needed for shortness of breath\wheezing\recurrent cough   1 Inhaler   6   . albuterol (PROVENTIL) (2.5 MG/3ML) 0.083% nebulizer solution   Nebulization   Take 3 mLs (2.5 mg total) by nebulization every 4 (four) hours as needed.   75 mL   6   . baclofen (LIORESAL) 10 MG tablet   Oral   Take 10 mg by mouth 4 (four) times daily as needed (headaches).          Marland Kitchen BIOTIN PO   Oral   Take 2,000 mg by mouth daily.         . cetirizine (ZYRTEC) 10 MG tablet   Oral   Take 1 tablet (10 mg total) by mouth daily.   30 tablet   11   . Cholecalciferol 50000 UNITS TABS   Oral   Take 1 tablet by mouth once a week. For 8 weeks   8 tablet   0   . citalopram (CELEXA) 40 MG tablet   Oral   Take 40 mg by mouth daily.         . clarithromycin (BIAXIN) 500 MG tablet   Oral   Take 1 tablet (500 mg total) by mouth 2 (two) times daily.   28 tablet   0   . cyclobenzaprine (FLEXERIL) 10 MG tablet   Oral   Take 0.5 tablets (5 mg total) by mouth 3  (three) times daily as needed for muscle spasms.   30 tablet   0   . esomeprazole (NEXIUM) 40 MG capsule   Oral   Take 1 capsule (40 mg total) by mouth daily.   30 capsule   6   . estradiol (ESTRACE) 0.1 MG/GM vaginal cream      Apply 1 gram per vagina every night for 2 weeks, then apply three times a week   30 g   12   . estrogen, conjugated,-medroxyprogesterone (PREMPRO) 0.3-1.5 MG per tablet   Oral   Take 1 tablet by mouth daily.   30 tablet   12   . fluticasone (FLONASE) 50 MCG/ACT nasal spray      PLACE 2 SPRAYS INTO THE NOSE DAILY.   16 g   6   . furosemide (LASIX) 20  MG tablet   Oral   Take 1 tablet (20 mg total) by mouth daily.   30 tablet   3   . ibuprofen (ADVIL,MOTRIN) 600 MG tablet   Oral   Take 1 tablet (600 mg total) by mouth 3 (three) times daily.   20 tablet   0   . ibuprofen (ADVIL,MOTRIN) 600 MG tablet   Oral   Take 1 tablet (600 mg total) by mouth every 6 (six) hours as needed.   30 tablet   0   . mometasone-formoterol (DULERA) 200-5 MCG/ACT AERO   Inhalation   Inhale 2 puffs into the lungs 2 (two) times daily.   8.8 g   1   . montelukast (SINGULAIR) 10 MG tablet   Oral   Take 1 tablet (10 mg total) by mouth at bedtime.   90 tablet   3   . oxybutynin (DITROPAN-XL) 10 MG 24 hr tablet   Oral   Take 1 tablet (10 mg total) by mouth at bedtime.   30 tablet   0   . propranolol ER (INDERAL LA) 80 MG 24 hr capsule   Oral   Take 1 capsule (80 mg total) by mouth daily.   30 capsule   2   . Pyridoxine HCl (VITAMIN B-6 PO)   Oral   Take 1 tablet by mouth daily. Unknown OTC strength         . simvastatin (ZOCOR) 40 MG tablet   Oral   Take 1 tablet (40 mg total) by mouth at bedtime.   90 tablet   3   . Spacer/Aero-Holding Chambers (AEROCHAMBER MV) inhaler      Use as instructed   1 each   0   . Tiotropium Bromide Monohydrate (SPIRIVA RESPIMAT) 2.5 MCG/ACT AERS   Inhalation   Inhale 2 puffs into the lungs daily.   4 g    3   . traZODone (DESYREL) 50 MG tablet   Oral   Take 50 mg by mouth at bedtime as needed for sleep. Reported on 12/26/2015         . verapamil (CALAN) 40 MG tablet   Oral   Take 40 mg by mouth daily. For palpitations/arrhythmias         . vitamin C (ASCORBIC ACID) 500 MG tablet   Oral   Take 1,000 mg by mouth daily.            Review of Systems: Gen:  Denies  fever, sweats, chills HEENT: Denies blurred vision, double vision, ear pain, eye pain. Admits to sore throat, nasal drainage/congestion Cvc:  No dizziness, chest pain or heaviness Resp:   Admits to: cough, sinus congestion, non productive cough Gi: Denies swallowing difficulty, stomach pain, nausea or vomiting, diarrhea, constipation, bowel incontinence Gu:  Denies bladder incontinence, burning urine Ext:   No Joint pain, stiffness or swelling Skin: No skin rash, easy bruising or bleeding or hives Endoc:  No polyuria, polydipsia , polyphagia or weight change Other:  All other systems negative  Allergies:  Amitriptyline; Fluticasone-salmeterol; Penicillins; Ciprofloxacin; Macrobid; and Sulfamethoxazole-trimethoprim  Physical Examination:  VS: BP 124/70 mmHg  Pulse 64  Ht 5\' 1"  (1.549 m)  Wt 197 lb 12.8 oz (89.721 kg)  BMI 37.39 kg/m2  SpO2 99%  LMP 12/30/2014 (Exact Date)  General Appearance: No distress  HEENT: PERRLA, no ptosis, no other lesions noticed. Mild Erythema of oropharynx Pulmonary:normal breath sounds., diaphragmatic excursion normal.No wheezing, No rales   Cardiovascular:  Normal S1,S2.  No  m/r/g.     Abdomen:Exam: Benign, Soft, non-tender, No masses  Skin:   warm, no rashes, no ecchymosis  Extremities: normal, no cyanosis, clubbing, warm with normal capillary refill.      6 minute walk tests 06/02/2015 -Distance 974 feet/297 m, low saturation 96%, highest heart rate 75. Pulmonary function testing FEV1 87% FEV1/FVC 93% ERV 21% DLCO uncorrected 71% Impression: No significant obstruction,  restriction, no diffusion abnormality, severe reduction in ERV could be related to obesity (central obesity), clinical correlation advised.  (The following images and results were reviewed by Dr. Stevenson Clinch on 12/26/2015).  CXR 08/17/15 The heart size and mediastinal contours are within normal limits. Both lungs are clear. The visualized skeletal structures are unremarkable.  IMPRESSION: No active cardiopulmonary disease.    ECHO 06/2015 Study Conclusions  - Left ventricle: The cavity size was normal. Wall thickness was increased in a pattern of mild LVH. Systolic function was vigorous. The estimated ejection fraction was in the range of 65% to 70%. Wall motion was normal; there were no regional wall motion abnormalities. Doppler parameters are consistent with abnormal left ventricular relaxation (grade 1 diastolic dysfunction). - Mitral valve: There was trivial regurgitation. - Right ventricle: The cavity size was normal. Wall thickness was normal. Systolic function was normal. - Inferior vena cava: The vessel was dilated. The respirophasic diameter changes were blunted (< 50%), consistent with elevated central venous pressure.  Assessment and Plan: 50 year old female seen in follow-up for asthma Asthma Patient with known history of asthma since 2004 and with recurrent bronchitis infections. Her asthma is under better control, since starting Dulera/Spiriva, and control of her chronic sinusitis issues by ENT. Triggers at this time could be weather changes and allergies.  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 Overall, today patient with good control moderate persistent asthma, currently using albuterol on average twice per week, this could be improve more.  Plan: - Continue with Spiriva Respimat 2.5 g, dulera, and rescue inhaler - Continue with allergy regimen as prescribed by  ENT     Updated Medication List Outpatient Encounter Prescriptions as of 12/26/2015  Medication Sig  . albuterol (PROVENTIL HFA;VENTOLIN HFA) 108 (90 BASE) MCG/ACT inhaler 2 puffs every 3-4 hours as needed for shortness of breath\wheezing\recurrent cough  . albuterol (PROVENTIL) (2.5 MG/3ML) 0.083% nebulizer solution Take 3 mLs (2.5 mg total) by nebulization every 4 (four) hours as needed.  . baclofen (LIORESAL) 10 MG tablet Take 10 mg by mouth 4 (four) times daily as needed (headaches).   Marland Kitchen BIOTIN PO Take 2,000 mg by mouth daily.  . cetirizine (ZYRTEC) 10 MG tablet Take 1 tablet (10 mg total) by mouth daily.  . Cholecalciferol 50000 UNITS TABS Take 1 tablet by mouth once a week. For 8 weeks  . citalopram (CELEXA) 40 MG tablet Take 40 mg by mouth daily.  . clarithromycin (BIAXIN) 500 MG tablet Take 1 tablet (500 mg total) by mouth 2 (two) times daily.  . cyclobenzaprine (FLEXERIL) 10 MG tablet Take 0.5 tablets (5 mg total) by mouth 3 (three) times daily as needed for muscle spasms.  Marland Kitchen esomeprazole (NEXIUM) 40 MG capsule Take 1 capsule (40 mg total) by mouth daily.  Marland Kitchen estradiol (ESTRACE) 0.1 MG/GM vaginal cream Apply 1 gram per vagina every night for 2 weeks, then apply three times a week  . estrogen, conjugated,-medroxyprogesterone (PREMPRO) 0.3-1.5 MG per tablet Take 1 tablet by mouth daily.  . fluticasone (FLONASE)  50 MCG/ACT nasal spray PLACE 2 SPRAYS INTO THE NOSE DAILY.  . furosemide (LASIX) 20 MG tablet Take 1 tablet (20 mg total) by mouth daily.  Marland Kitchen ibuprofen (ADVIL,MOTRIN) 600 MG tablet Take 1 tablet (600 mg total) by mouth 3 (three) times daily.  Marland Kitchen ibuprofen (ADVIL,MOTRIN) 600 MG tablet Take 1 tablet (600 mg total) by mouth every 6 (six) hours as needed.  . mometasone-formoterol (DULERA) 200-5 MCG/ACT AERO Inhale 2 puffs into the lungs 2 (two) times daily.  . montelukast (SINGULAIR) 10 MG tablet Take 1 tablet (10 mg total) by mouth at bedtime.  Marland Kitchen oxybutynin (DITROPAN-XL) 10 MG 24 hr  tablet Take 1 tablet (10 mg total) by mouth at bedtime.  . propranolol ER (INDERAL LA) 80 MG 24 hr capsule Take 1 capsule (80 mg total) by mouth daily.  . Pyridoxine HCl (VITAMIN B-6 PO) Take 1 tablet by mouth daily. Unknown OTC strength  . simvastatin (ZOCOR) 40 MG tablet Take 1 tablet (40 mg total) by mouth at bedtime.  Marland Kitchen Spacer/Aero-Holding Chambers (AEROCHAMBER MV) inhaler Use as instructed  . Tiotropium Bromide Monohydrate (SPIRIVA RESPIMAT) 2.5 MCG/ACT AERS Inhale 2 puffs into the lungs daily.  . traZODone (DESYREL) 50 MG tablet Take 50 mg by mouth at bedtime as needed for sleep. Reported on 12/26/2015  . verapamil (CALAN) 40 MG tablet Take 40 mg by mouth daily. For palpitations/arrhythmias  . vitamin C (ASCORBIC ACID) 500 MG tablet Take 1,000 mg by mouth daily.  . [DISCONTINUED] predniSONE (DELTASONE) 20 MG tablet Take 1 tablet (20 mg total) by mouth daily. (Patient not taking: Reported on 12/26/2015)   No facility-administered encounter medications on file as of 12/26/2015.    Orders for this visit: Orders Placed This Encounter  Procedures  . Ambulatory referral to Pulmonology    Referral Priority:  Routine    Referral Type:  Consultation    Referral Reason:  Specialty Services Required    Requested Specialty:  Pulmonary Disease    Number of Visits Requested:  1    Thank  you for the visitation and for allowing  [facility] to assist in the care of your patient. Our recommendations are noted above.  Please contact us if we can be of further service.  Vilinda Boehringer, MD [facility] Office Number: [phone]

## 2015-12-26 NOTE — Telephone Encounter (Signed)
Appears patient had appt with Pulmonology earlier today and discussed sleep study.  She is to follow-up with Dr Annamaria Boots for more treatment planning regarding sleep apnea.  Virginia Crews, MD, MPH PGY-2,  [facility] 12/26/2015 1:30 PM

## 2015-12-30 ENCOUNTER — Ambulatory Visit (INDEPENDENT_AMBULATORY_CARE_PROVIDER_SITE_OTHER): Payer: 59 | Admitting: Family Medicine

## 2015-12-30 ENCOUNTER — Encounter: Payer: Self-pay | Admitting: Family Medicine

## 2015-12-30 VITALS — BP 145/82 | HR 61 | Temp 98.4°F | Ht 61.0 in | Wt 196.4 lb

## 2015-12-30 DIAGNOSIS — R195 Other fecal abnormalities: Secondary | ICD-10-CM

## 2015-12-30 DIAGNOSIS — N3289 Other specified disorders of bladder: Secondary | ICD-10-CM | POA: Insufficient documentation

## 2015-12-30 DIAGNOSIS — G4733 Obstructive sleep apnea (adult) (pediatric): Secondary | ICD-10-CM | POA: Diagnosis not present

## 2015-12-30 DIAGNOSIS — R109 Unspecified abdominal pain: Secondary | ICD-10-CM | POA: Insufficient documentation

## 2015-12-30 LAB — COMPLETE METABOLIC PANEL WITH GFR
ALBUMIN: 4.3 g/dL (ref 3.6–5.1)
ALK PHOS: 41 U/L (ref 33–115)
ALT: 17 U/L (ref 6–29)
AST: 16 U/L (ref 10–35)
BUN: 16 mg/dL (ref 7–25)
CHLORIDE: 102 mmol/L (ref 98–110)
CO2: 30 mmol/L (ref 20–31)
Calcium: 9.1 mg/dL (ref 8.6–10.2)
Creat: 0.91 mg/dL (ref 0.50–1.10)
GFR, EST NON AFRICAN AMERICAN: 74 mL/min (ref >=60)
GFR, Est African American: 86 mL/min (ref >=60)
GLUCOSE: 141 mg/dL — AB (ref 65–99)
POTASSIUM: 4 mmol/L (ref 3.5–5.3)
SODIUM: 138 mmol/L (ref 135–146)
Total Bilirubin: 0.3 mg/dL (ref 0.2–1.2)
Total Protein: 7.5 g/dL (ref 6.1–8.1)

## 2015-12-30 MED ORDER — MONTELUKAST SODIUM 10 MG PO TABS: 10.0000 mg | ORAL_TABLET | Freq: Every day | ORAL | Status: DC

## 2015-12-30 MED ORDER — PHENAZOPYRIDINE HCL 95 MG PO TABS
95.0000 mg | ORAL_TABLET | Freq: Three times a day (TID) | ORAL | Status: DC | PRN
Start: 2015-12-30 — End: 2017-05-29

## 2015-12-30 MED ORDER — OXYBUTYNIN CHLORIDE ER 10 MG PO TB24: 10.0000 mg | ORAL_TABLET | Freq: Every day | ORAL | Status: DC

## 2015-12-30 MED ORDER — PROPRANOLOL HCL ER 80 MG PO CP24: 80.0000 mg | ORAL_CAPSULE | Freq: Every day | ORAL | Status: DC

## 2015-12-30 MED ORDER — CETIRIZINE HCL 10 MG PO TABS: 10.0000 mg | ORAL_TABLET | Freq: Every day | ORAL | Status: DC

## 2015-12-30 MED ORDER — ESOMEPRAZOLE MAGNESIUM 40 MG PO CPDR: 40.0000 mg | DELAYED_RELEASE_CAPSULE | Freq: Every day | ORAL | Status: DC

## 2015-12-30 MED FILL — PROPRANOLOL ER 80 MG CAP: 80 | 30 days supply | Qty: 30 | Fill #0

## 2015-12-30 MED FILL — MONTELUKAST SOD 10 MG TAB: 10 | 90 days supply | Qty: 90 | Fill #0

## 2015-12-30 MED FILL — PHENAZOPYRIDINE 100 MG TAB: 100 | 10 days supply | Qty: 30 | Fill #0

## 2015-12-30 MED FILL — OXYBUTYNIN CL ER 10 MG TAB: 10 | 30 days supply | Qty: 30 | Fill #0

## 2015-12-30 MED FILL — ESOMEPRAZOLE MAG DR 40 MG C: 40 | 30 days supply | Qty: 30 | Fill #0

## 2015-12-30 NOTE — Assessment & Plan Note (Signed)
No evidence of urinary tract infection Refilled oxybutynin and Pyridium

## 2015-12-30 NOTE — Assessment & Plan Note (Signed)
Advised patient to continue using C Pap Advised patient that follow-up with sleep medicine clinic is essential

## 2015-12-30 NOTE — Progress Notes (Signed)
   Subjective:   [name] is a 50 y.o. female with a history of chronic diastolic heart failure, OSA on CPAP here for bladder spasms and change in color of stools.  Bladder spasms - taking oxybutinin  - tried the one pill of something (unsure of name) that she had left and she felt better - also with urinary frequency, no dysuria or fevers - does feel spasm like pain  Stools - usually brown, but recently more yellowish in color - eating sausage in the cafeteria and wonders if this is the problem - also has foul odor to it - ongoing for ~2 wks - no change in consistency - also cramping in abdomen (central) and nausea - denies vomiting, fevers, constipation, diarrhea - did notice mucous behind BM once 2-3 days ago - laying on stomach helps cramping - nothing seems to make it worse  OSA with CPAP - feels very fatigues without CPAP - Dr Stevenson Clinch mentioned that she may not need CPAP anymore - patient is concerned because she feels like she really needs CPAP - does not have appt with Dr Annamaria Boots - was told by Pulm that they would refer her there  Review of Systems:  Per HPI.   Social History: former smoker  Objective:  BP 145/82 mmHg  Pulse 61  Temp(Src) 98.4 F (36.9 C) (Oral)  Ht _0  (1.549 m)  Wt 196 lb 6.4 oz (89.086 kg)  BMI 37.13 kg/m2  LMP 12/30/2014 (Exact Date)  Gen:  50 y.o. female in NAD HEENT: NCAT, MMM, EOMI, PERRL, anicteric sclerae CV: RRR, no MRG Resp: Non-labored, CTAB, no wheezes noted Abd: Soft, ND, mildly TTP around umbilicus, BS present, no guarding/rebound or organomegaly, neg Murphy's sign Ext: WWP, no edema MSK: No obvious deformities Neuro: Alert and oriented, speech normal     Assessment & Plan:     [name] is a 49 y.o. female here for pale stools, bladder spasms, and OSA  Obstructive sleep apnea Advised patient to continue using C Pap Advised patient that follow-up with sleep medicine clinic is essential  Light stools Concern  for possible biliary tree or bilirubin issue Right upper quadrant ultrasound scheduled C met today Follow-up in 2 weeks  Bladder spasms No evidence of urinary tract infection Refilled oxybutynin and Pyridium    Virginia Crews, MD MPH PGY-2,  [facility] 12/30/2015  4:50 PM

## 2015-12-30 NOTE — Assessment & Plan Note (Signed)
Concern for possible biliary tree or bilirubin issue Right upper quadrant ultrasound scheduled C met today Follow-up in 2 weeks

## 2015-12-30 NOTE — Patient Instructions (Signed)

## 2016-01-04 ENCOUNTER — Encounter: Payer: Self-pay | Admitting: Family Medicine

## 2016-01-10 ENCOUNTER — Ambulatory Visit (HOSPITAL_COMMUNITY): Payer: 59

## 2016-01-13 ENCOUNTER — Ambulatory Visit: Payer: Self-pay | Admitting: Family Medicine

## 2016-01-18 ENCOUNTER — Ambulatory Visit (HOSPITAL_COMMUNITY)
Admission: RE | Admit: 2016-01-18 | Discharge: 2016-01-18 | Disposition: A | Discharge status: Home / Self Care | Payer: 59 | Source: Ambulatory Visit | Attending: Family Medicine | Admitting: Family Medicine

## 2016-01-18 DIAGNOSIS — R109 Unspecified abdominal pain: Secondary | ICD-10-CM | POA: Diagnosis not present

## 2016-01-18 DIAGNOSIS — R195 Other fecal abnormalities: Secondary | ICD-10-CM | POA: Diagnosis not present

## 2016-01-18 DIAGNOSIS — K7689 Other specified diseases of liver: Secondary | ICD-10-CM | POA: Diagnosis not present

## 2016-01-27 ENCOUNTER — Ambulatory Visit (INDEPENDENT_AMBULATORY_CARE_PROVIDER_SITE_OTHER): Payer: 59 | Admitting: Family Medicine

## 2016-01-27 ENCOUNTER — Encounter: Payer: Self-pay | Admitting: Family Medicine

## 2016-01-27 VITALS — BP 130/90 | HR 76 | Temp 98.7°F | Ht 61.0 in | Wt 192.0 lb

## 2016-01-27 DIAGNOSIS — R109 Unspecified abdominal pain: Secondary | ICD-10-CM

## 2016-01-27 NOTE — Progress Notes (Signed)
   Subjective:   [name] is a 50 y.o. female with a history of migraines, dCHF, asthma, OSA here for abd pain f/u  Loose Stools and abd cramping - really wonders if it is related to hospital cafeteria food - cramping pains in stomach (central) - ate a hot dog from cafeteria last week and had severe cramping - trying to stay away from fried foods and cut back on cheese - started bringing food from home and no pain with this - ongoing intermittently for ~6wks - having loose stools soon after eating cafeteria foods - stools are still intermittently light yellow in color - cramping eases off post-BM - denies vomiting, fevers, constipation  Review of Systems:  Per HPI.   Social History: former smoker  Objective:  BP 130/90 mmHg  Pulse 76  Temp(Src) 98.7 F (37.1 C) (Oral)  Ht 5\' 1"  (1.549 m)  Wt 192 lb (87.091 kg)  BMI 36.30 kg/m2  SpO2 97%  LMP 12/30/2014 (Exact Date)  Gen:  50 y.o. female in NAD HEENT: NCAT, MMM, PERRL, anicteric sclerae CV: RRR, no MRG Resp: Non-labored, CTAB, no wheezes noted Abd: Soft, NTND, BS present, no guarding or organomegaly Ext: WWP, no edema MSK: No obvious deformities Neuro: Alert and oriented, speech normal    Assessment & Plan:     [name] is a 50 y.o. female here for Abdominal cramping and loose stool follow-up  Abdominal cramping Normal CMP and right upper quadrant ultrasound recently Normal abdominal exam today Patient believes that cafeteria food is the issue Advised patient to avoid foods that trigger loose stools and cramping Follow-up as needed    Virginia Crews, MD MPH PGY-2,  [facility] 01/28/2016  5:18 PM

## 2016-01-28 NOTE — Patient Instructions (Signed)

## 2016-01-28 NOTE — Assessment & Plan Note (Signed)
Normal CMP and right upper quadrant ultrasound recently Normal abdominal exam today Patient believes that cafeteria food is the issue Advised patient to avoid foods that trigger loose stools and cramping Follow-up as needed

## 2016-02-17 ENCOUNTER — Ambulatory Visit (INDEPENDENT_AMBULATORY_CARE_PROVIDER_SITE_OTHER): Payer: 59 | Admitting: Family Medicine

## 2016-02-17 ENCOUNTER — Encounter: Payer: Self-pay | Admitting: Family Medicine

## 2016-02-17 VITALS — BP 125/74 | HR 60 | Temp 98.2°F | Wt 198.7 lb

## 2016-02-17 DIAGNOSIS — G43701 Chronic migraine without aura, not intractable, with status migrainosus: Secondary | ICD-10-CM

## 2016-02-17 MED ORDER — ZOLMITRIPTAN 5 MG PO TABS: 5.0000 mg | ORAL_TABLET | ORAL | Status: DC | PRN

## 2016-02-17 MED ORDER — PROMETHAZINE HCL 25 MG PO TABS: 25.0000 mg | ORAL_TABLET | Freq: Three times a day (TID) | ORAL | Status: DC | PRN

## 2016-02-17 MED ORDER — KETOROLAC TROMETHAMINE 30 MG/ML IJ SOLN
30.0000 mg | Freq: Once | INTRAMUSCULAR | Status: AC
  Administered 2016-02-17: 30 mg via INTRAVENOUS

## 2016-02-17 MED ORDER — BACLOFEN 10 MG PO TABS: 10.0000 mg | ORAL_TABLET | Freq: Three times a day (TID) | ORAL | Status: DC | PRN

## 2016-02-17 MED ORDER — KETOROLAC TROMETHAMINE 30 MG/ML IM SOLN: 30.0000 mg | Freq: Once | INTRAMUSCULAR | Status: DC

## 2016-02-17 MED FILL — PROMETHAZINE 25 MG TABLET: 25 | 7 days supply | Qty: 20 | Fill #0

## 2016-02-17 MED FILL — SPIRIVA RESPIMAT INHAL SPRY: 2.5 | 30 days supply | Qty: 4 | Fill #2

## 2016-02-17 MED FILL — VENTOLIN HFA 90 MCG INHALER: 108 (90 BAS | 30 days supply | Qty: 18 | Fill #2

## 2016-02-17 MED FILL — ZOLMitriptan 5 MG TABS: 5 | 30 days supply | Qty: 9 | Fill #0

## 2016-02-17 MED FILL — BACLOFEN 10 MG TABLET: 10 | 20 days supply | Qty: 60 | Fill #0

## 2016-02-17 NOTE — Progress Notes (Signed)
Subjective:    Patient ID: [name], female    DOB: Nov 28, 1965, 50 y.o.   MRN: [id number]  [name] is a 50 y.o. female presenting on 02/17/2016 for No chief complaint on file.   Patient presents for a same day appointment.    HPI  MIGRAINE HEADACHE, acute on chronic: - History of allergies and sinusitis, initially sinus congestion within past 1-2 weeks seems to trigger this - Reports symptoms started Wednesday AM with headache in back of head, then still lingering worsening into Thursday tried Ibuprofen 800mg  last night and rested with warm compress, woke up this morning with Right frontal headache - Also admits Right eye started blurry vision yesterday, without worsening or improvement today, similar to prior episodes of migraines with same blurry vision - Concerned about taking too much ibuprofen due to irritating stomach - Followed by Dr Domingo Cocking at Headache clinic, previously had received botox, stopped going there now - Prior treated with Baclofen with good relief - Takes migraine prophylaxis with several medications - Admits nausea without vomiting - tolerating ginger ale, liquids - Denies any focal weakness, worsening headache, chest pain, dyspnea, injury / trauma   Social History  Substance Use Topics  . Smoking status: Former Smoker -- 0.25 packs/day for 15 years    Types: Cigarettes    Quit date: 02/16/2003  . Smokeless tobacco: Never Used  . Alcohol Use: Yes     Comment: socially    Review of Systems Per HPI unless specifically indicated above     Objective:    BP 125/74 mmHg  Pulse 60  Temp(Src) 98.2 F (36.8 C) (Oral)  Wt 198 lb 11.2 oz (90.13 kg)  LMP 12/30/2014 (Exact Date)  Wt Readings from Last 3 Encounters:  02/17/16 198 lb 11.2 oz (90.13 kg)  01/27/16 192 lb (87.091 kg)  12/30/15 196 lb 6.4 oz (89.086 kg)    Physical Exam  Constitutional: She appears well-developed and well-nourished. No distress.  Uncomfortable due to headache but  otherwise well-appearing and cooperative  HENT:  Head: Normocephalic and atraumatic.  Mouth/Throat: Oropharynx is clear and moist.  Right frontal tenderness mild to palpation, otherwise sinuses non-tender, TM's clear without effusion, nares patent w/o congestion  Eyes: Conjunctivae and EOM are normal. Pupils are equal, round, and reactive to light. Right eye exhibits no discharge. Left eye exhibits no discharge.  Neck: Normal range of motion. Neck supple.  Cardiovascular: Normal rate, regular rhythm, normal heart sounds and intact distal pulses.   No murmur heard. Pulmonary/Chest: Effort normal.  Musculoskeletal: Normal range of motion. She exhibits no tenderness.  Neurological: She is alert. No cranial nerve deficit. Coordination normal.  Skin: Skin is warm and dry. No rash noted. She is not diaphoretic.  Nursing note and vitals reviewed.      Assessment & Plan:   Problem List Items Addressed This Visit    Migraine - Primary    Acute on chronic, status migraine now improving x 3 days, localized R-sided including R-blurry vision (similar to prior), otherwise no focal neuro deficits. Tolerating liquids PO. - Out of most migraine meds, improved on ibuprofen  - Toradol IM in clinic today  Plan: 1. Refilled abortive therapy with Zolmitriptan 5mg  PRN migraine 2. Refilled Baclofen PRN pain, Phenergan PRN nausea/vomiting 3. Recommend taking Ibuprofen 600-800mg  q 8 hr PRN, and can try Tylenol 1000mg  TID alternatively 4. Avoid triggers including foods, caffeine. Important to rest 5. Return criteria given       Relevant Medications  baclofen (LIORESAL) 10 MG tablet   promethazine (PHENERGAN) 25 MG tablet   zolmitriptan (ZOMIG) 5 MG tablet   ketorolac (TORADOL) 30 MG/ML injection 30 mg (Completed)      Meds ordered this encounter  Medications  . baclofen (LIORESAL) 10 MG tablet    Sig: Take 1 tablet (10 mg total) by mouth 3 (three) times daily as needed (headaches).    Dispense:   60 each    Refill:  1  . promethazine (PHENERGAN) 25 MG tablet    Sig: Take 1 tablet (25 mg total) by mouth every 8 (eight) hours as needed for nausea or vomiting.    Dispense:  20 tablet    Refill:  1  . zolmitriptan (ZOMIG) 5 MG tablet    Sig: Take 1 tablet (5 mg total) by mouth as needed. For migraine    Dispense:  10 tablet    Refill:  2  . DISCONTD: ketorolac (TORADOL) 30 MG/ML injection    Sig: Inject 1 mL (30 mg total) into the muscle once.    Dispense:  1 mL    Refill:  0  . ketorolac (TORADOL) 30 MG/ML injection 30 mg    Sig:       Follow up plan: Return in about 4 weeks (around 03/16/2016), or if symptoms worsen or fail to improve, for migraine follow-up.  [name], [place], PGY-3

## 2016-02-17 NOTE — Assessment & Plan Note (Addendum)
Acute on chronic, status migraine now improving x 3 days, localized R-sided including R-blurry vision (similar to prior), otherwise no focal neuro deficits. Tolerating liquids PO. - Out of most migraine meds, improved on ibuprofen  - Toradol IM in clinic today  Plan: 1. Refilled abortive therapy with Zolmitriptan 5mg  PRN migraine 2. Refilled Baclofen PRN pain, Phenergan PRN nausea/vomiting 3. Recommend taking Ibuprofen 600-800mg  q 8 hr PRN, and can try Tylenol 1000mg  TID alternatively 4. Avoid triggers including foods, caffeine. Important to rest 5. Return criteria given

## 2016-02-17 NOTE — Patient Instructions (Signed)
Thank you for coming in to clinic today.  1. Toradol injection today, may continue ibuprofen at home every 6 to 8 hours - Refill baclofen take as needed - It is safe to take Tylenol Ext Str 500mg  tabs - take 1 to 2 (max dose 1000mg ) every 6 hours as needed for breakthrough pain, max 24 hour daily dose is 6 to 8 tablets or 4000mg   If symptoms do not improve or get worse with more loss of vision, focal weakness or numbness in arm or leg, face, or new acute headache, unable to tolerate food drink with nausea vomiting not responding then return to clinic or ED   Please schedule a follow-up appointment with Dr Brita Romp in 2-4 weeks for Migraines  If you have any other questions or concerns, please feel free to call the clinic to contact me. You may also schedule an earlier appointment if necessary.  However, if your symptoms get significantly worse, please go to the Emergency Department to seek immediate medical attention.  Nobie Putnam, [place]

## 2016-02-20 ENCOUNTER — Other Ambulatory Visit: Payer: Self-pay | Admitting: *Deleted

## 2016-02-20 MED ORDER — MOMETASONE FURO-FORMOTEROL FUM 200-5 MCG/ACT IN AERO: 2.0000 | INHALATION_SPRAY | Freq: Two times a day (BID) | RESPIRATORY_TRACT | Status: DC

## 2016-02-21 ENCOUNTER — Telehealth: Payer: Self-pay | Admitting: *Deleted

## 2016-02-21 NOTE — Telephone Encounter (Signed)
Patient called back and states that [employer] should have faxed the form for 2 problems: migraines and asthma. Jazmin Hartsell,CMA

## 2016-02-21 NOTE — Telephone Encounter (Signed)
Faxed back to [employer] today.  Please let patient know.  Virginia Crews, MD, MPH PGY-2,  [facility] 02/21/2016 4:34 PM

## 2016-02-21 NOTE — Telephone Encounter (Signed)
Patient called today checking on the status of her FMLA forms that should have been faxed to [place] on 02/17/16.  Patient would like a call back to let her know if they have been received or not yet.  If Dr. Brita Romp doesn't have them, she does have copies at home that she can bring in for [place] to fill out. Chukwudi Ewen,CMA

## 2016-03-02 ENCOUNTER — Telehealth: Payer: Self-pay | Admitting: Internal Medicine

## 2016-03-02 MED FILL — DULERA 200 MCG/5 MCG INH: 200-5 | 30 days supply | Qty: 13 | Fill #0

## 2016-03-02 NOTE — Telephone Encounter (Signed)
I have attempted to contact patient since 02/24/16.  All this has been documented inside the referral.  I have left multiple messages for the patient to contact me at [phone] when there was space available on her voice mail.  Attempted to contact patient back today on the below number (same number as I have been calling) and pt's mailbox was full and I was unable to leave message again. This call was at 4:00 pm 03/02/16. Rhonda J Cobb

## 2016-03-02 NOTE — Telephone Encounter (Signed)
Patient called regarding her sleep study appointment. Please call patient. Patient not happy that she hasn't been called.

## 2016-03-02 NOTE — Telephone Encounter (Signed)
Called patient back at 4:30 and was able to reach patient. Advised patient that we had tried to contact her and gave her all the dates and times that I had attempted to call.  Pt stated that "her phone was messing up but if I had contacted her after 3:30, I could have reached her". I gave her the dates and times that I had attempted to contact her 3:30 and after as well.  Appointment has been scheduled with Dr. Ashby Dawes for Monday 03/19/16, arrival time at 11:30 [place], [place].  Pt is aware of her appointment date, time and location.  Rhonda J Cobb

## 2016-03-02 NOTE — Telephone Encounter (Signed)
Please call.

## 2016-03-14 ENCOUNTER — Emergency Department (HOSPITAL_BASED_OUTPATIENT_CLINIC_OR_DEPARTMENT_OTHER)
Admission: EM | Admit: 2016-03-14 | Discharge: 2016-03-14 | Disposition: A | Discharge status: Home / Self Care | Payer: 59 | Attending: Emergency Medicine | Admitting: Emergency Medicine

## 2016-03-14 ENCOUNTER — Emergency Department (HOSPITAL_BASED_OUTPATIENT_CLINIC_OR_DEPARTMENT_OTHER): Payer: 59

## 2016-03-14 ENCOUNTER — Encounter (HOSPITAL_BASED_OUTPATIENT_CLINIC_OR_DEPARTMENT_OTHER): Payer: Self-pay

## 2016-03-14 DIAGNOSIS — N12 Tubulo-interstitial nephritis, not specified as acute or chronic: Secondary | ICD-10-CM | POA: Insufficient documentation

## 2016-03-14 DIAGNOSIS — J45909 Unspecified asthma, uncomplicated: Secondary | ICD-10-CM | POA: Diagnosis not present

## 2016-03-14 DIAGNOSIS — R1031 Right lower quadrant pain: Secondary | ICD-10-CM | POA: Diagnosis present

## 2016-03-14 DIAGNOSIS — R109 Unspecified abdominal pain: Secondary | ICD-10-CM | POA: Diagnosis not present

## 2016-03-14 DIAGNOSIS — Z87891 Personal history of nicotine dependence: Secondary | ICD-10-CM | POA: Diagnosis not present

## 2016-03-14 DIAGNOSIS — F329 Major depressive disorder, single episode, unspecified: Secondary | ICD-10-CM | POA: Insufficient documentation

## 2016-03-14 LAB — URINE MICROSCOPIC-ADD ON

## 2016-03-14 LAB — CBC WITH DIFFERENTIAL/PLATELET
BASOS ABS: 0 10*3/uL (ref 0.0–0.1)
BASOS PCT: 0 %
EOS ABS: 0.1 10*3/uL (ref 0.0–0.7)
Eosinophils Relative: 1 %
HEMATOCRIT: 38.6 % (ref 36.0–46.0)
HEMOGLOBIN: 13.4 g/dL (ref 12.0–15.0)
LYMPHS ABS: 1.1 10*3/uL (ref 0.7–4.0)
Lymphocytes Relative: 17 %
MCH: 31.9 pg (ref 26.0–34.0)
MCHC: 34.7 g/dL (ref 30.0–36.0)
MCV: 91.9 fL (ref 78.0–100.0)
MONOS PCT: 10 %
Monocytes Absolute: 0.6 10*3/uL (ref 0.1–1.0)
NEUTROS ABS: 4.7 10*3/uL (ref 1.7–7.7)
NEUTROS PCT: 72 %
Platelets: 275 10*3/uL (ref 150–400)
RBC: 4.2 MIL/uL (ref 3.87–5.11)
RDW: 12.3 % (ref 11.5–15.5)
WBC: 6.5 10*3/uL (ref 4.0–10.5)

## 2016-03-14 LAB — COMPREHENSIVE METABOLIC PANEL
ALBUMIN: 3.9 g/dL (ref 3.5–5.0)
ALK PHOS: 37 U/L — AB (ref 38–126)
ALT: 15 U/L (ref 14–54)
AST: 16 U/L (ref 15–41)
Anion gap: 7 (ref 5–15)
BUN: 14 mg/dL (ref 6–20)
CALCIUM: 9 mg/dL (ref 8.9–10.3)
CHLORIDE: 104 mmol/L (ref 101–111)
CO2: 25 mmol/L (ref 22–32)
CREATININE: 0.82 mg/dL (ref 0.44–1.00)
GFR calc non Af Amer: >60 mL/min (ref >60)
GLUCOSE: 106 mg/dL — AB (ref 65–99)
Potassium: 3.8 mmol/L (ref 3.5–5.1)
Sodium: 136 mmol/L (ref 135–145)
Total Bilirubin: 0.7 mg/dL (ref 0.3–1.2)
Total Protein: 7.4 g/dL (ref 6.5–8.1)

## 2016-03-14 LAB — URINALYSIS, ROUTINE W REFLEX MICROSCOPIC
Bilirubin Urine: NEGATIVE
Glucose, UA: NEGATIVE mg/dL
Hgb urine dipstick: NEGATIVE
Ketones, ur: NEGATIVE mg/dL
NITRITE: POSITIVE — AB
PROTEIN: NEGATIVE mg/dL
SPECIFIC GRAVITY, URINE: 1.022 (ref 1.005–1.030)
pH: 5.5 (ref 5.0–8.0)

## 2016-03-14 LAB — PREGNANCY, URINE: PREG TEST UR: NEGATIVE

## 2016-03-14 LAB — OCCULT BLOOD X 1 CARD TO LAB, STOOL: Fecal Occult Bld: NEGATIVE

## 2016-03-14 MED ORDER — MORPHINE SULFATE (PF) 2 MG/ML IV SOLN
2.0000 mg | Freq: Once | INTRAVENOUS | Status: AC
Start: 2016-03-14 — End: 2016-03-14
  Administered 2016-03-14: 2 mg via INTRAVENOUS
  Filled 2016-03-14: qty 1

## 2016-03-14 MED ORDER — DEXTROSE 5 % IV SOLN
1.0000 g | Freq: Once | INTRAVENOUS | Status: AC
  Administered 2016-03-14: 1 g via INTRAVENOUS
  Filled 2016-03-14: qty 10

## 2016-03-14 MED ORDER — ONDANSETRON HCL 4 MG/2ML IJ SOLN
4.0000 mg | Freq: Once | INTRAMUSCULAR | Status: AC
  Administered 2016-03-14: 4 mg via INTRAVENOUS
  Filled 2016-03-14: qty 2

## 2016-03-14 MED ORDER — SODIUM CHLORIDE 0.9 % IV BOLUS (SEPSIS)
1000.0000 mL | Freq: Once | INTRAVENOUS | Status: AC
  Administered 2016-03-14: 1000 mL via INTRAVENOUS

## 2016-03-14 MED ORDER — SODIUM CHLORIDE 0.9 % IV BOLUS (SEPSIS): 1000.0000 mL | Freq: Once | INTRAVENOUS | Status: DC

## 2016-03-14 MED ORDER — MORPHINE SULFATE (PF) 4 MG/ML IV SOLN
4.0000 mg | Freq: Once | INTRAVENOUS | Status: AC
  Administered 2016-03-14: 4 mg via INTRAVENOUS
  Filled 2016-03-14: qty 1

## 2016-03-14 MED ORDER — CEPHALEXIN 500 MG PO CAPS: 500.0000 mg | ORAL_CAPSULE | Freq: Four times a day (QID) | ORAL | Status: DC

## 2016-03-14 MED FILL — CEPHALEXIN 500 MG CAPSULE: 500 | 13 days supply | Qty: 52 | Fill #0

## 2016-03-14 NOTE — ED Notes (Signed)
Pt c/o abd pain, N/V, and dizziness x since yesterday this AM. Pt also states her urine is malodorous. Pt reports a Tmax of 99.9 at home.

## 2016-03-14 NOTE — Discharge Instructions (Signed)
You are being treated for a mild kidney infection. Please take this antibiotic for the next two weeks. If symptoms worsen (worsening fevers, chills, vomiting), please return immediately.

## 2016-03-14 NOTE — ED Provider Notes (Signed)
CSN: [id number]     Arrival date & time 03/14/16  0703 History   First MD Initiated Contact with Patient 03/14/16 [id number]     Chief Complaint  Patient presents with  . Abdominal Pain     (Consider location/radiation/quality/duration/timing/severity/associated sxs/prior Treatment) HPI Comments: Patient presents with right lower quadrant and suprapubic pain lasting for the last 2 days. Patient states that pain started abruptly with associated nausea and vomiting. She has also had some loose stools and that describes the stool as orange in color. No frank blood or melena. Patient has taken ibuprofen which has helped mildly. She has also tried Pyridium for her pain which has not helped. She states that the pain is worse when urinating.  Patient is a 50 y.o. female presenting with abdominal pain. The history is provided by the patient.  Abdominal Pain Pain location:  Suprapubic and RLQ Pain quality: aching, stabbing and throbbing   Pain radiates to:  R flank Pain severity:  Moderate Onset quality:  Sudden Duration:  2 days Timing:  Intermittent Progression:  Worsening Chronicity:  Recurrent Context: previous surgery   Context: not alcohol use, not laxative use, not retching and not sick contacts   Relieved by:  NSAIDs (improved for about 1 hour) Worsened by:  Urination Ineffective treatments: pyridium. Associated symptoms: chills, diarrhea, dysuria, fever (subjective), nausea and vomiting   Associated symptoms: no chest pain, no constipation, no hematuria and no vaginal discharge     Past Medical History  Diagnosis Date  . Migraines   . Asthma   . Anemia   . Anxiety   . Depression   . PONV (postoperative nausea and vomiting)   . Dysrhythmia     Irregular heat w/ asthma episodes and anxiety  . Sleep apnea     uses CPAP  . GERD (gastroesophageal reflux disease)   . Urinary incontinence     frequent urination- tx w/ vesicare   Past Surgical History  Procedure Laterality Date  .  Ablation    . Cesarean section      x3  . Mab      x 4  . Dilation and curettage of uterus  05/31/2005    MAB, TAB x 2  . Tubal ligation  11/01/2006  . Leep    . Hernia repair  1990's  . Wisdom tooth extraction    . Mouth surgery      teeth extraction  . Hysteroscopy w/ endometrial ablation     Family History  Problem Relation Age of Onset  . Diabetes Maternal Grandmother   . Anesthesia problems Neg Hx    Social History  Substance Use Topics  . Smoking status: Former Smoker -- 0.25 packs/day for 15 years    Types: Cigarettes    Quit date: 02/16/2003  . Smokeless tobacco: Never Used  . Alcohol Use: Yes     Comment: socially   OB History    Gravida Para Term Preterm AB TAB SAB Ectopic Multiple Living   15 8 8  0 7 1 2   4      Review of Systems  Constitutional: Positive for fever (subjective) and chills.  Cardiovascular: Negative for chest pain.  Gastrointestinal: Positive for nausea, vomiting, abdominal pain and diarrhea. Negative for constipation.  Genitourinary: Positive for dysuria. Negative for hematuria and vaginal discharge.      Allergies  Amitriptyline; Fluticasone-salmeterol; Penicillins; Ciprofloxacin; Macrobid; and Sulfamethoxazole-trimethoprim  Home Medications   Prior to Admission medications   Medication Sig Start Date End Date Taking?  Authorizing Provider  albuterol (PROVENTIL HFA;VENTOLIN HFA) 108 (90 BASE) MCG/ACT inhaler 2 puffs every 3-4 hours as needed for shortness of breath\wheezing\recurrent cough 05/09/15   Vishal Mungal, MD  albuterol (PROVENTIL) (2.5 MG/3ML) 0.083% nebulizer solution Take 3 mLs (2.5 mg total) by nebulization every 4 (four) hours as needed. 12/31/14   Virginia Crews, MD  baclofen (LIORESAL) 10 MG tablet Take 1 tablet (10 mg total) by mouth 3 (three) times daily as needed (headaches). 02/17/16   Olin Hauser, DO  BIOTIN PO Take 2,000 mg by mouth daily.    Historical Provider, MD  cephALEXin (KEFLEX) 500 MG capsule  Take 1 capsule (500 mg total) by mouth 4 (four) times daily. 03/14/16   Mariel Aloe, MD  cetirizine (ZYRTEC) 10 MG tablet Take 1 tablet (10 mg total) by mouth daily. 12/30/15   Virginia Crews, MD  Cholecalciferol 50000 UNITS TABS Take 1 tablet by mouth once a week. For 8 weeks 08/19/15   Leeanne Rio, MD  citalopram (CELEXA) 40 MG tablet Take 40 mg by mouth daily.    Historical Provider, MD  clarithromycin (BIAXIN) 500 MG tablet Take 1 tablet (500 mg total) by mouth 2 (two) times daily. 09/27/15   Vishal Mungal, MD  esomeprazole (NEXIUM) 40 MG capsule Take 1 capsule (40 mg total) by mouth daily. 12/30/15   Virginia Crews, MD  estradiol (ESTRACE) 0.1 MG/GM vaginal cream Apply 1 gram per vagina every night for 2 weeks, then apply three times a week 01/14/15   Osborne Oman, MD  estrogen, conjugated,-medroxyprogesterone (PREMPRO) 0.3-1.5 MG per tablet Take 1 tablet by mouth daily. 01/19/15   Sallyanne Havers Anyanwu, MD  fluticasone (FLONASE) 50 MCG/ACT nasal spray PLACE 2 SPRAYS INTO THE NOSE DAILY. 04/15/14   Renee A Kuneff, DO  furosemide (LASIX) 20 MG tablet Take 1 tablet (20 mg total) by mouth daily. 09/09/15   Virginia Crews, MD  ibuprofen (ADVIL,MOTRIN) 600 MG tablet Take 1 tablet (600 mg total) by mouth 3 (three) times daily. 03/29/15   Dahlia Bailiff, PA-C  ibuprofen (ADVIL,MOTRIN) 600 MG tablet Take 1 tablet (600 mg total) by mouth every 6 (six) hours as needed. 05/31/15   York Ram Melancon, MD  mometasone-formoterol (DULERA) 200-5 MCG/ACT AERO Inhale 2 puffs into the lungs 2 (two) times daily. 02/20/16   Vishal Mungal, MD  montelukast (SINGULAIR) 10 MG tablet Take 1 tablet (10 mg total) by mouth at bedtime. 12/30/15   Virginia Crews, MD  oxybutynin (DITROPAN-XL) 10 MG 24 hr tablet Take 1 tablet (10 mg total) by mouth at bedtime. 12/30/15   Virginia Crews, MD  phenazopyridine (PYRIDIUM) 95 MG tablet Take 1 tablet (95 mg total) by mouth 3 (three) times daily as needed for pain. 12/30/15    Virginia Crews, MD  promethazine (PHENERGAN) 25 MG tablet Take 1 tablet (25 mg total) by mouth every 8 (eight) hours as needed for nausea or vomiting. 02/17/16   Olin Hauser, DO  propranolol ER (INDERAL LA) 80 MG 24 hr capsule Take 1 capsule (80 mg total) by mouth daily. 12/30/15   Virginia Crews, MD  Pyridoxine HCl (VITAMIN B-6 PO) Take 1 tablet by mouth daily. Unknown OTC strength    Historical Provider, MD  simvastatin (ZOCOR) 40 MG tablet Take 1 tablet (40 mg total) by mouth at bedtime. 09/09/15   Virginia Crews, MD  Spacer/Aero-Holding Chambers (AEROCHAMBER MV) inhaler Use as instructed 05/09/15   Vilinda Boehringer, MD  Tiotropium  Bromide Monohydrate (SPIRIVA RESPIMAT) 2.5 MCG/ACT AERS Inhale 2 puffs into the lungs daily. 05/09/15   Vishal Mungal, MD  traZODone (DESYREL) 50 MG tablet Take 50 mg by mouth at bedtime as needed for sleep. Reported on 12/26/2015    Historical Provider, MD  verapamil (CALAN) 40 MG tablet Take 40 mg by mouth daily. For palpitations/arrhythmias    Historical Provider, MD  vitamin C (ASCORBIC ACID) 500 MG tablet Take 1,000 mg by mouth daily.    Historical Provider, MD  zolmitriptan (ZOMIG) 5 MG tablet Take 1 tablet (5 mg total) by mouth as needed. For migraine 02/17/16   Devonne Doughty Karamalegos, DO   BP 135/81 mmHg  Pulse 57  Temp(Src) 98.2 F (36.8 C) (Oral)  Resp 18  Ht 5\' 1"  (1.549 m)  Wt 89.359 kg  BMI 37.24 kg/m2  SpO2 100%  LMP 12/30/2014 (Exact Date) Physical Exam  Constitutional: She appears well-developed and well-nourished.  Eyes: Conjunctivae and EOM are normal. Pupils are equal, round, and reactive to light.  Neck: Normal range of motion.  Cardiovascular: Normal rate and regular rhythm.   Pulmonary/Chest: Effort normal and breath sounds normal. No respiratory distress. She has no wheezes. She has no rales.  Abdominal: Soft. Bowel sounds are normal. She exhibits distension (mild). There is no splenomegaly or hepatomegaly. There is  tenderness in the right lower quadrant and suprapubic area. There is tenderness at McBurney's point. There is no rigidity, no rebound, no guarding and negative Murphy's sign.  Genitourinary: Rectum normal. Guaiac negative stool.  Lymphadenopathy:    She has no cervical adenopathy.    ED Course  Procedures (including critical care time) Labs Review Labs Reviewed  URINALYSIS, ROUTINE W REFLEX MICROSCOPIC (NOT AT [facility]) - Abnormal; Notable for the following:    Color, Urine ORANGE (*)    APPearance CLOUDY (*)    Nitrite POSITIVE (*)    Leukocytes, UA TRACE (*)    All other components within normal limits  COMPREHENSIVE METABOLIC PANEL - Abnormal; Notable for the following:    Glucose, Bld 106 (*)    Alkaline Phosphatase 37 (*)    All other components within normal limits  URINE MICROSCOPIC-ADD ON - Abnormal; Notable for the following:    Squamous Epithelial / LPF 0-5 (*)    Bacteria, UA MANY (*)    All other components within normal limits  URINE CULTURE  PREGNANCY, URINE  CBC WITH DIFFERENTIAL/PLATELET  OCCULT BLOOD X 1 CARD TO LAB, STOOL    Imaging Review Ct Renal Stone Study  03/14/2016  CLINICAL DATA:  Right-sided abdominal pain EXAM: CT ABDOMEN AND PELVIS WITHOUT CONTRAST TECHNIQUE: Multidetector CT imaging of the abdomen and pelvis was performed following the standard protocol without IV contrast. COMPARISON:  01/18/2016, 03/14/2010 FINDINGS: Lung bases are free of acute infiltrate or sizable effusion. Multiple hepatic cysts are again identified. These have increased slightly in size and number when compared with the prior exam although all appear benign. The spleen, adrenal glands and pancreas are within normal limits. The gallbladder is well visualized and unremarkable. The kidneys show no evidence of renal calculi or urinary tract obstructive changes. The ureters and urinary bladder are within normal limits. The uterus is somewhat bulky consistent with uterine fibroid change  similar to that seen on the prior exam. The appendix is within normal limits. No free pelvic fluid is seen. No significant diverticular changes noted. The osseous structures show no acute abnormality. IMPRESSION: Chronic changes as described above.  No acute abnormality is noted.  Electronically Signed   By: Inez Catalina M.D.   On: 03/14/2016 09:17   I have personally reviewed and evaluated these images and lab results as part of my medical decision-making.   EKG Interpretation None      MDM   Final diagnoses:  Pyelonephritis    Patient with abdominal pain likely secondary to mild pyelonephritis. Symptoms improved with IV morphine and normal saline bolus. Abdominal CT significant for no appendicitis or kidney stone. Will treat with two-week course of antibiotics. Urine culture pending. Return precautions discussed patient agrees to plan for discharge home.    Mariel Aloe, MD 03/14/16 [place], MD 03/15/16 [id number]

## 2016-03-15 ENCOUNTER — Encounter: Payer: Self-pay | Admitting: Family Medicine

## 2016-03-15 ENCOUNTER — Ambulatory Visit (INDEPENDENT_AMBULATORY_CARE_PROVIDER_SITE_OTHER): Payer: 59 | Admitting: Family Medicine

## 2016-03-15 VITALS — BP 141/89 | HR 83 | Temp 98.2°F | Ht 61.0 in | Wt 197.4 lb

## 2016-03-15 DIAGNOSIS — N12 Tubulo-interstitial nephritis, not specified as acute or chronic: Secondary | ICD-10-CM | POA: Diagnosis not present

## 2016-03-15 DIAGNOSIS — R300 Dysuria: Secondary | ICD-10-CM | POA: Diagnosis not present

## 2016-03-15 LAB — POCT URINALYSIS DIPSTICK
BILIRUBIN UA: NEGATIVE
GLUCOSE UA: NEGATIVE
KETONES UA: NEGATIVE
LEUKOCYTES UA: NEGATIVE
NITRITE UA: NEGATIVE
Protein, UA: NEGATIVE
Spec Grav, UA: 1.01
Urobilinogen, UA: 0.2
pH, UA: 5.5

## 2016-03-15 LAB — POCT UA - MICROSCOPIC ONLY

## 2016-03-15 NOTE — Patient Instructions (Signed)
I think they had it correct - that this is a kidney infection. I will call tomorrow with the culture results and to see how you are doing.

## 2016-03-16 ENCOUNTER — Telehealth: Payer: Self-pay | Admitting: Family Medicine

## 2016-03-16 DIAGNOSIS — N12 Tubulo-interstitial nephritis, not specified as acute or chronic: Secondary | ICD-10-CM | POA: Insufficient documentation

## 2016-03-16 LAB — URINE CULTURE

## 2016-03-16 NOTE — Progress Notes (Signed)
   Subjective:    Patient ID: [name], female    DOB: [birth year], 50 y.o.   MRN: [id number]  HPI   Seen in Urgent Care with Rt Flank and groin pain, diagnosed with pyelo and treated with Keflex.  She is feeling some better.  Still with pain.  No fever.  Tried to go to work today and then came here because of pain.  Also had and is still having some hematuria.  She did have a renal stone protocol CT so no kidney stones. Taking Keflex 4 times a day without difficulty.  Drinking plenty of fluids.  No nausea or difficulty voiding.   Review of Systems     Objective:   Physical Exam Mild right CVA and suprapubic tenderness. Non toxic appearance.       Assessment & Plan:

## 2016-03-16 NOTE — Telephone Encounter (Signed)
The following note was on the office visit info under assessment and plan. "Follow up phone call 5/19. Called and left message that culture does show infection and that Keflex should be effective treatment. Call if questions or if worsening symptoms." Patient has been informed. Ottis Stain, CMA

## 2016-03-16 NOTE — Assessment & Plan Note (Signed)
Right sided.  Improving.  Work note given.  Continue Keflex.  Follow up phone call 5/19.  Called and left message that culture does show infection and that Keflex should be effective treatment. Call if questions or if worsening symptoms.

## 2016-03-16 NOTE — Telephone Encounter (Signed)
Pt is calling to give [place] her new number and she thought she had a missed call from [place] at her old number. She was seen by Dr. Andria Frames on 03/15/16, hw

## 2016-03-16 NOTE — Telephone Encounter (Signed)
FYI

## 2016-03-17 ENCOUNTER — Telehealth (HOSPITAL_BASED_OUTPATIENT_CLINIC_OR_DEPARTMENT_OTHER): Payer: Self-pay

## 2016-03-17 NOTE — Telephone Encounter (Signed)
Post ED Visit - Positive Culture Follow-up  Culture report reviewed by antimicrobial stewardship pharmacist:  []  Elenor Quinones, Pharm.D. []  Heide Guile, Pharm.D., BCPS []  Parks Neptune, Pharm.D. []  Alycia Rossetti, Pharm.D., BCPS [x]  [place], Pharm.D., BCPS, AAHIVP []  Legrand Como, Pharm.D., BCPS, AAHIVP []  Milus Glazier, Pharm.D. []  Stephens November, Pharm.D.  Positive urine culture Treated with Cephalexin, organism sensitive to the same and no further patient follow-up is required at this time.  Genia Del 03/17/2016, 1:07 PM

## 2016-03-19 ENCOUNTER — Ambulatory Visit (INDEPENDENT_AMBULATORY_CARE_PROVIDER_SITE_OTHER): Payer: 59 | Admitting: Internal Medicine

## 2016-03-19 ENCOUNTER — Encounter: Payer: Self-pay | Admitting: Internal Medicine

## 2016-03-19 VITALS — BP 128/72 | HR 75 | Ht 61.0 in | Wt 197.0 lb

## 2016-03-19 DIAGNOSIS — G4733 Obstructive sleep apnea (adult) (pediatric): Secondary | ICD-10-CM

## 2016-03-19 DIAGNOSIS — G4720 Circadian rhythm sleep disorder, unspecified type: Secondary | ICD-10-CM | POA: Diagnosis not present

## 2016-03-19 NOTE — Progress Notes (Signed)
* [facility]     Assessment and Plan:  Severe excessive daytime sleepiness. -I suspect that she is not getting an adequate amount of total sleep time with her CPAP device. -I've asked her to make sure that she wears her CPAP during all sleep periods including when she is napping. She also would ideally be able to get more sleep and overnight.  Obstructive sleep apnea. -Mild sleep apnea, with AHI of 6. -We will be checking an auto Pap test to make sure her pressure is adequate.  Circadian rhythm/shift work sleep disorder. -Patient works an overnight shift, this may be contributing to her daytime sleepiness. -Discussed strategy to try to maximize her total sleep time.   Date: 03/19/2016  MRN# [id number] [name] 1966-01-13   [name] is a 50 y.o. old female seen in follow up for chief complaint of  Chief Complaint  Patient presents with  . sleep consult    VM pt.  pt currently wears CPAP avg 6hr nighly. feels pressure is good. DME:[facility]     HPI:   The patient is a 50 year old female, who sees Dr. Stevenson Kelly here in the office for COPD/asthma. She is referred here due to check of sleep apnea. She underwent a sleep study on 12/02/15, which showed an AHI of 6, consistent with mild obstructive sleep apnea.   She was already known to have OSA on a sleep study a few years ago, not sure when. She was started on cpap machine and was doing well with it. She was not using it nightly.  Recently she was noticing that she was very sleepy during the day; she had fallen asleep at a stop light. She takes a nap every day after work because. She does not use her PAP for naps, but does use it at night time. When she does not use it, she feels that she did not sleep with it at all.   Currently she is sleeping with it every night. She goes to be around 8 pm and puts on her nasal mask, then falls asleep within about 15 min. She does not wake at night. She wakes at 2 am to her alarm  to be at work by [name], she works at [employer] at [employer] [facility]. Her fiance says that her mask is leaking, not sure that she is snoring. On weekends she sleeps until 7 am. On those days she feels more rested.   She usually takes a 1 hour nap when she gets home from work around 3pm.  Medication:   Outpatient Encounter Prescriptions as of 03/19/2016  Medication Sig  . albuterol (PROVENTIL HFA;VENTOLIN HFA) 108 (90 BASE) MCG/ACT inhaler 2 puffs every 3-4 hours as needed for shortness of breath\wheezing\recurrent cough  . albuterol (PROVENTIL) (2.5 MG/3ML) 0.083% nebulizer solution Take 3 mLs (2.5 mg total) by nebulization every 4 (four) hours as needed.  . baclofen (LIORESAL) 10 MG tablet Take 1 tablet (10 mg total) by mouth 3 (three) times daily as needed (headaches).  Marland Kitchen BIOTIN PO Take 2,000 mg by mouth daily.  . cephALEXin (KEFLEX) 500 MG capsule Take 1 capsule (500 mg total) by mouth 4 (four) times daily.  . cetirizine (ZYRTEC) 10 MG tablet Take 1 tablet (10 mg total) by mouth daily.  . Cholecalciferol 50000 UNITS TABS Take 1 tablet by mouth once a week. For 8 weeks  . citalopram (CELEXA) 40 MG tablet Take 40 mg by mouth daily.  . clarithromycin (BIAXIN) 500 MG tablet Take 1  tablet (500 mg total) by mouth 2 (two) times daily.  Marland Kitchen esomeprazole (NEXIUM) 40 MG capsule Take 1 capsule (40 mg total) by mouth daily.  Marland Kitchen estradiol (ESTRACE) 0.1 MG/GM vaginal cream Apply 1 gram per vagina every night for 2 weeks, then apply three times a week  . estrogen, conjugated,-medroxyprogesterone (PREMPRO) 0.3-1.5 MG per tablet Take 1 tablet by mouth daily.  . fluticasone (FLONASE) 50 MCG/ACT nasal spray PLACE 2 SPRAYS INTO THE NOSE DAILY.  . furosemide (LASIX) 20 MG tablet Take 1 tablet (20 mg total) by mouth daily.  Marland Kitchen ibuprofen (ADVIL,MOTRIN) 600 MG tablet Take 1 tablet (600 mg total) by mouth 3 (three) times daily.  Marland Kitchen ibuprofen (ADVIL,MOTRIN) 600 MG tablet Take 1 tablet (600 mg total) by mouth every 6 (six) hours as needed.  .  mometasone-formoterol (DULERA) 200-5 MCG/ACT AERO Inhale 2 puffs into the lungs 2 (two) times daily.  . montelukast (SINGULAIR) 10 MG tablet Take 1 tablet (10 mg total) by mouth at bedtime.  Marland Kitchen oxybutynin (DITROPAN-XL) 10 MG 24 hr tablet Take 1 tablet (10 mg total) by mouth at bedtime.  . phenazopyridine (PYRIDIUM) 95 MG tablet Take 1 tablet (95 mg total) by mouth 3 (three) times daily as needed for pain.  . promethazine (PHENERGAN) 25 MG tablet Take 1 tablet (25 mg total) by mouth every 8 (eight) hours as needed for nausea or vomiting.  . propranolol ER (INDERAL LA) 80 MG 24 hr capsule Take 1 capsule (80 mg total) by mouth daily.  . Pyridoxine HCl (VITAMIN B-6 PO) Take 1 tablet by mouth daily. Unknown OTC strength  . simvastatin (ZOCOR) 40 MG tablet Take 1 tablet (40 mg total) by mouth at bedtime.  Marland Kitchen Spacer/Aero-Holding Chambers (AEROCHAMBER MV) inhaler Use as instructed  . Tiotropium Bromide Monohydrate (SPIRIVA RESPIMAT) 2.5 MCG/ACT AERS Inhale 2 puffs into the lungs daily.  . traZODone (DESYREL) 50 MG tablet Take 50 mg by mouth at bedtime as needed for sleep. Reported on 12/26/2015  . verapamil (CALAN) 40 MG tablet Take 40 mg by mouth daily. For palpitations/arrhythmias  . vitamin C (ASCORBIC ACID) 500 MG tablet Take 1,000 mg by mouth daily.  Marland Kitchen zolmitriptan (ZOMIG) 5 MG tablet Take 1 tablet (5 mg total) by mouth as needed. For migraine   No facility-administered encounter medications on file as of 03/19/2016.     Allergies:  Amitriptyline; Fluticasone-salmeterol; Penicillins; Ciprofloxacin; Macrobid; and Sulfamethoxazole-trimethoprim  Review of Systems: Gen:  Denies  fever, sweats. HEENT: Denies blurred vision. Cvc:  No dizziness, chest pain or heaviness Resp:   Denies cough or sputum porduction. Gi: Denies swallowing difficulty, stomach pain. constipation, bowel incontinence Gu:  Denies bladder incontinence, burning urine Ext:   No Joint pain, stiffness. Skin: No skin rash, easy  bruising. Endoc:  No polyuria, polydipsia. Psych: No depression, insomnia. Other:  All other systems were reviewed and found to be negative other than what is mentioned in the HPI.   Physical Examination:   VS: BP 128/72 mmHg  Pulse 75  Ht 5\' 1"  (1.549 m)  Wt 197 lb (89.359 kg)  BMI 37.24 kg/m2  SpO2 98%  LMP 12/30/2014 (Exact Date)  General Appearance: No distress  Neuro:without focal findings,  speech normal,  HEENT: PERRLA, EOM intact. Pulmonary: normal breath sounds, No wheezing.   CardiovascularNormal S1,S2.  No m/r/g.   Abdomen: Benign, Soft, non-tender. Renal:  No costovertebral tenderness  GU:  Not performed at this time. Endoc: No evident thyromegaly, no signs of acromegaly. Skin:   warm, no  rash. Extremities: normal, no cyanosis, clubbing.   LABORATORY PANEL:   CBC  Recent Labs Lab 03/14/16 0750  WBC 6.5  HGB 13.4  HCT 38.6  PLT 275   ------------------------------------------------------------------------------------------------------------------  Chemistries   Recent Labs Lab 03/14/16 0750  NA 136  K 3.8  CL 104  CO2 25  GLUCOSE 106*  BUN 14  CREATININE 0.82  CALCIUM 9.0  AST 16  ALT 15  ALKPHOS 37*  BILITOT 0.7   ------------------------------------------------------------------------------------------------------------------  Cardiac Enzymes No results for input(s): TROPONINI in the last 168 hours. ------------------------------------------------------------  RADIOLOGY:   No results found for this or any previous visit. Results for orders placed during the hospital encounter of 11/28/15  DG Chest 2 View   Narrative CLINICAL DATA:  Right-sided chest pain for 3 days no fever  EXAM: CHEST  2 VIEW  COMPARISON:  08/17/2015  FINDINGS: Cardiac silhouette upper normal in size but stable. Vascular pattern normal. Lungs clear. No effusions.  IMPRESSION: No active cardiopulmonary disease.   Electronically Signed   By: Skipper Cliche M.D.   On: 11/28/2015 12:37    ------------------------------------------------------------------------------------------------------------------  Thank  you for allowing [facility] to assist in the care of your patient. Our recommendations are noted above.  Please contact us if we can be of further service.   Marda Stalker, MD.  [facility] Office Number: [phone]  Patricia Pesa, M.D.  Vilinda Boehringer, M.D.  Merton Border, M.D  03/19/2016

## 2016-03-19 NOTE — Patient Instructions (Addendum)
Will send for auto-pap 5-20 and will review data.   Use CPAP for all naps, try to increase your total sleep time.

## 2016-04-12 ENCOUNTER — Other Ambulatory Visit (HOSPITAL_COMMUNITY)
Admission: RE | Admit: 2016-04-12 | Discharge: 2016-04-12 | Disposition: A | Discharge status: Home / Self Care | Payer: 59 | Source: Ambulatory Visit | Attending: Family Medicine | Admitting: Family Medicine

## 2016-04-12 ENCOUNTER — Ambulatory Visit (INDEPENDENT_AMBULATORY_CARE_PROVIDER_SITE_OTHER): Payer: 59 | Admitting: Family Medicine

## 2016-04-12 ENCOUNTER — Encounter: Payer: Self-pay | Admitting: Family Medicine

## 2016-04-12 VITALS — BP 154/73 | HR 65 | Temp 98.2°F | Wt 192.0 lb

## 2016-04-12 DIAGNOSIS — R300 Dysuria: Secondary | ICD-10-CM

## 2016-04-12 DIAGNOSIS — N938 Other specified abnormal uterine and vaginal bleeding: Secondary | ICD-10-CM | POA: Diagnosis not present

## 2016-04-12 DIAGNOSIS — N760 Acute vaginitis: Secondary | ICD-10-CM | POA: Diagnosis not present

## 2016-04-12 DIAGNOSIS — Z113 Encounter for screening for infections with a predominantly sexual mode of transmission: Secondary | ICD-10-CM | POA: Diagnosis not present

## 2016-04-12 DIAGNOSIS — R102 Pelvic and perineal pain: Secondary | ICD-10-CM

## 2016-04-12 LAB — POCT URINALYSIS DIPSTICK
Bilirubin, UA: NEGATIVE
Glucose, UA: NEGATIVE
KETONES UA: NEGATIVE
Leukocytes, UA: NEGATIVE
Nitrite, UA: NEGATIVE
PROTEIN UA: NEGATIVE
RBC UA: NEGATIVE
SPEC GRAV UA: 1.02
UROBILINOGEN UA: 0.2
pH, UA: 7

## 2016-04-12 LAB — POCT URINE PREGNANCY: PREG TEST UR: NEGATIVE

## 2016-04-12 NOTE — Patient Instructions (Signed)
Testing for STDs today Getting ultrasound of your pelvis to look at your ovaries & uterus I will call you when I have your results back  If bleeding worsens, pain worsens, you have fever, cannot eat or drink, or have more changes in your urine please call us or go to the ER for evaluation.  Be well, Dr. Ardelia Mems

## 2016-04-12 NOTE — Progress Notes (Signed)
Date of Visit: 04/12/2016   HPI:  Patient presents for a same day appointment to discuss pelvic pain and blood on toilet paper.  Since Sunday of last week has had R sided pelvic pain. When she urinates, urinates lots of volume. Feels lots of pressure when she pees. RLQ/pelvic pain radiates down her leg. Yesterday she wiped after urinating and saw blood on the toilet paper. She is confident the blood is not coming from her rectum. Had a normal bowel movement this morning. No fever. Feels nauseated. Very stressed out by lots of social things going on in her life right now. Denies SI/HI. Taking ibuprofen to help with the pain in RLQ.   Of note was treated a month ago for pyelonephritis, and got better after antibiotics. Wonders if the UTI has returned. She did have a yeast infection after the antibiotics but reports taking an over the counter medication to help with that. Also had some loose stools with the antibiotics, but this is resolved.   Reports no vaginal bleeding in the last several years. Has history of endometrial ablation in February 2013, done for heavy bleeding. Normal endometrial pathology from [facility] at that time. Is on combined estrogen-progesterone orally for menopausal symptoms.   ROS: See HPI  [place]: history of pyelonephritis, asthma, vit D, prediabetes, hyperlipidemia, chronic diastolic CHF, depression/anxiety  PHYSICAL EXAM: BP 154/73 mmHg  Pulse 65  Temp(Src) 98.2 F (36.8 C) (Oral)  Wt 192 lb (87.091 kg)  LMP 12/30/2014 (Exact Date) Gen: NAD, pleasant, cooperative HEENT: normocephalic, atraumatic, moist mucous membranes Heart: regular rate and rhythm, no murmur Lungs: clear to auscultation bilaterally, normal work of breathing  Abdomen: soft. Normoactive bowel sounds. Mildly tender to palpation in RLQ. No rebound, guarding, masses, or peritoneal signs.  Neuro: alert, grossly nonfocal, speech normal Extremities: no swelling bilateral lower extremities GU: normal  appearing external genitalia without lesions. Vagina is moist with small amount of brown old blood products. Cervix normal in appearance. No cervical motion tenderness. Mild R sided adnexal tenderness. No uterine tenderness. No L sided adnexal tenderness. No adnexal masses.   ASSESSMENT/PLAN:  50 yo F presents with R sided pelvic/adnexal pain and blood on toilet paper.  Urinalysis without any abnormalities, suggests against urologic cause. Brown blood products present in vaginal vault suggests uterine cause of bleeding. Doubt intraabdominal pathology like appendicitis as patient is afebrile, no peritoneal signs, and has associated vaginal bleeding - points more towards GYN etiology. Plan: - check gc/chlamydia/trich today - stat ultrasound of pelvis, want to rule out intermittent ovarian torsion or other ovarian pathology (cyst/neoplasm/etc). Also needs evaluation of endometrial stripe given arguably postmenopausal state (difficult to fully determine menopausal status as she is on hormones for menopausal symptoms and has had an ablation previously). Soonest appointment available for ultrasound is tomorrow morning, even after changing order to stat. - discussed return precautions which should prompt emergent evaluation, see AVS - follow up by phone once ultrasound has resulted - encouraged patient to go ahead and call OB/GYN office as anticipate she may need endometrial biopsy.  FOLLOW UP: Follow up by phone once ultrasound resulted Schedule with OB/GYN.  [place]. Ardelia Mems, [place]

## 2016-04-13 ENCOUNTER — Ambulatory Visit (HOSPITAL_COMMUNITY)
Admission: RE | Admit: 2016-04-13 | Discharge: 2016-04-13 | Disposition: A | Discharge status: Home / Self Care | Payer: 59 | Source: Ambulatory Visit | Attending: Family Medicine | Admitting: Family Medicine

## 2016-04-13 ENCOUNTER — Telehealth: Payer: Self-pay | Admitting: Family Medicine

## 2016-04-13 DIAGNOSIS — R102 Pelvic and perineal pain: Secondary | ICD-10-CM | POA: Insufficient documentation

## 2016-04-13 DIAGNOSIS — R300 Dysuria: Secondary | ICD-10-CM | POA: Insufficient documentation

## 2016-04-13 DIAGNOSIS — R935 Abnormal findings on diagnostic imaging of other abdominal regions, including retroperitoneum: Secondary | ICD-10-CM | POA: Diagnosis not present

## 2016-04-13 DIAGNOSIS — N939 Abnormal uterine and vaginal bleeding, unspecified: Secondary | ICD-10-CM | POA: Insufficient documentation

## 2016-04-13 LAB — CERVICOVAGINAL ANCILLARY ONLY
Chlamydia: NEGATIVE
Neisseria Gonorrhea: NEGATIVE
TRICH (WINDOWPATH): NEGATIVE

## 2016-04-13 NOTE — Telephone Encounter (Signed)
Informed patient of ultrasound showing adenomyosis. Normal ovaries. Needs follow up with OB/GYN. Patient reports has appointment scheduled with them in a few weeks.   Also discussed with patient recommendation not to take more than 600mg  of ibuprofen every 6 hours - she was taking more than this prior to her visit yesterday  Patient appreciative  Leeanne Rio, MD

## 2016-04-16 ENCOUNTER — Telehealth: Payer: Self-pay | Admitting: *Deleted

## 2016-04-16 ENCOUNTER — Ambulatory Visit (HOSPITAL_COMMUNITY): Payer: 59

## 2016-04-16 NOTE — Telephone Encounter (Signed)
Patient informed. 

## 2016-04-16 NOTE — Telephone Encounter (Signed)
-----   Message from Leeanne Rio, MD sent at 04/13/2016  5:31 PM EDT ----- Please call patient & inform her of negative STD tests. Thanks Leeanne Rio, MD

## 2016-04-24 ENCOUNTER — Ambulatory Visit (INDEPENDENT_AMBULATORY_CARE_PROVIDER_SITE_OTHER): Payer: 59 | Admitting: Internal Medicine

## 2016-04-24 ENCOUNTER — Encounter: Payer: Self-pay | Admitting: Internal Medicine

## 2016-04-24 VITALS — BP 136/80 | HR 77 | Ht 61.0 in | Wt 194.4 lb

## 2016-04-24 DIAGNOSIS — J4540 Moderate persistent asthma, uncomplicated: Secondary | ICD-10-CM

## 2016-04-24 NOTE — Progress Notes (Signed)
MRN# [id number] [name] 1966-06-05   CC: Chief Complaint  Patient presents with  . Follow-up    pt states breating has improved since last OV. cont sob w/exertion, non prod cough mainly @ night, occ wheezing, chest tightness improves with neb treatment       Brief History: 50 -year-old female with history of mild to moderate persistent asthma, triggered by rapid weather changes, CAT dander, perfumes.  Events since last clinic visit: Presents today for a follow up of asthma Since her last visit, she has been doing overall well. She's had follow-up with ENT, they started her on eardrops, nasal spray, and nasal ointment. She also had blood allergy testing, which showed positive reaction to pet dander, trees, grass. 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 She states also, and that the rapid weather changes with the rain has made her asthma symptoms worse also. Patient states she still has a cough at night and with laying flat at times but it is tolerable. Overall for the past 2-3 days she's had significant improvement in her breathing and her cough. She states that her cough is intermittent productive at times with clear to thick white sputum  Medication:   Current Outpatient Rx  Name  Route  Sig  Dispense  Refill  . albuterol (PROVENTIL HFA;VENTOLIN HFA) 108 (90 BASE) MCG/ACT inhaler      2 puffs every 3-4 hours as needed for shortness of breath\wheezing\recurrent cough   1 Inhaler   6   . albuterol (PROVENTIL) (2.5 MG/3ML) 0.083% nebulizer solution   Nebulization   Take 3 mLs (2.5 mg total) by nebulization every 4 (four) hours as needed.   75 mL   6   . baclofen (LIORESAL) 10 MG tablet   Oral   Take 1  tablet (10 mg total) by mouth 3 (three) times daily as needed (headaches).   60 each   1   . BIOTIN PO   Oral   Take 2,000 mg by mouth daily.         . cephALEXin (KEFLEX) 500 MG capsule   Oral   Take 1 capsule (500 mg total) by mouth 4 (four) times daily.   52 capsule   0   . cetirizine (ZYRTEC) 10 MG tablet   Oral   Take 1 tablet (10 mg total) by mouth daily.   30 tablet   11   . Cholecalciferol 50000 UNITS TABS   Oral   Take 1 tablet by mouth once a week. For 8 weeks   8 tablet   0   . citalopram (CELEXA) 40 MG tablet   Oral   Take 40 mg by mouth daily.         . clarithromycin (BIAXIN) 500 MG tablet   Oral   Take 1 tablet (500 mg total) by mouth 2 (two) times daily.   28 tablet   0   . esomeprazole (NEXIUM) 40 MG capsule   Oral   Take 1 capsule (40 mg total) by mouth daily.   30 capsule   6   . estradiol (ESTRACE) 0.1 MG/GM vaginal cream      Apply 1 gram per vagina every night for 2 weeks, then  apply three times a week   30 g   12   . estrogen, conjugated,-medroxyprogesterone (PREMPRO) 0.3-1.5 MG per tablet   Oral   Take 1 tablet by mouth daily.   30 tablet   12   . fluticasone (FLONASE) 50 MCG/ACT nasal spray      PLACE 2 SPRAYS INTO THE NOSE DAILY.   16 g   6   . furosemide (LASIX) 20 MG tablet   Oral   Take 1 tablet (20 mg total) by mouth daily.   30 tablet   3   . ibuprofen (ADVIL,MOTRIN) 600 MG tablet   Oral   Take 1 tablet (600 mg total) by mouth 3 (three) times daily.   20 tablet   0   . ibuprofen (ADVIL,MOTRIN) 600 MG tablet   Oral   Take 1 tablet (600 mg total) by mouth every 6 (six) hours as needed.   30 tablet   0   . mometasone-formoterol (DULERA) 200-5 MCG/ACT AERO   Inhalation   Inhale 2 puffs into the lungs 2 (two) times daily.   8.8 g   1   . montelukast (SINGULAIR) 10 MG tablet   Oral   Take 1 tablet (10 mg total) by mouth at bedtime.   90 tablet   3   . oxybutynin (DITROPAN-XL) 10 MG 24 hr tablet    Oral   Take 1 tablet (10 mg total) by mouth at bedtime.   30 tablet   0   . phenazopyridine (PYRIDIUM) 95 MG tablet   Oral   Take 1 tablet (95 mg total) by mouth 3 (three) times daily as needed for pain.   30 tablet   1   . promethazine (PHENERGAN) 25 MG tablet   Oral   Take 1 tablet (25 mg total) by mouth every 8 (eight) hours as needed for nausea or vomiting.   20 tablet   1   . propranolol ER (INDERAL LA) 80 MG 24 hr capsule   Oral   Take 1 capsule (80 mg total) by mouth daily.   30 capsule   2   . Pyridoxine HCl (VITAMIN B-6 PO)   Oral   Take 1 tablet by mouth daily. Unknown OTC strength         . simvastatin (ZOCOR) 40 MG tablet   Oral   Take 1 tablet (40 mg total) by mouth at bedtime.   90 tablet   3   . Spacer/Aero-Holding Chambers (AEROCHAMBER MV) inhaler      Use as instructed   1 each   0   . Tiotropium Bromide Monohydrate (SPIRIVA RESPIMAT) 2.5 MCG/ACT AERS   Inhalation   Inhale 2 puffs into the lungs daily.   4 g   3   . traZODone (DESYREL) 50 MG tablet   Oral   Take 50 mg by mouth at bedtime as needed for sleep. Reported on 12/26/2015         . verapamil (CALAN) 40 MG tablet   Oral   Take 40 mg by mouth daily. For palpitations/arrhythmias         . vitamin C (ASCORBIC ACID) 500 MG tablet   Oral   Take 1,000 mg by mouth daily.         Marland Kitchen zolmitriptan (ZOMIG) 5 MG tablet   Oral   Take 1 tablet (5 mg total) by mouth as needed. For migraine   10 tablet   2      Review of  Systems: Gen:  Denies  fever, sweats, chills HEENT: Denies blurred vision, double vision, ear pain, eye pain. Admits to sore throat, nasal drainage/congestion Cvc:  No dizziness, chest pain or heaviness Resp:   Admits to: cough, sinus congestion, Intermittent productive cough Gi: Denies swallowing difficulty, stomach pain, nausea or vomiting, diarrhea, constipation, bowel incontinence Gu:  Denies bladder incontinence, burning urine Ext:   No Joint pain, stiffness  or swelling Skin: No skin rash, easy bruising or bleeding or hives Endoc:  No polyuria, polydipsia , polyphagia or weight change Other:  All other systems negative  Allergies:  Amitriptyline; Fluticasone-salmeterol; Penicillins; Ciprofloxacin; Macrobid; and Sulfamethoxazole-trimethoprim  Physical Examination:  VS: BP 136/80 mmHg  Pulse 77  Ht 5\' 1"  (1.549 m)  Wt 194 lb 6.4 oz (88.179 kg)  BMI 36.75 kg/m2  SpO2 95%  LMP 12/30/2014 (Exact Date)  General Appearance: No distress  HEENT: PERRLA, no ptosis, no other lesions noticed. Mild Erythema of oropharynx Pulmonary:normal breath sounds., diaphragmatic excursion normal.No wheezing, No rales   Cardiovascular:  Normal S1,S2.  No m/r/g.     Abdomen:Exam: Benign, Soft, non-tender, No masses  Skin:   warm, no rashes, no ecchymosis  Extremities: normal, no cyanosis, clubbing, warm with normal capillary refill.      6 minute walk tests 06/02/2015 -Distance 974 feet/297 m, low saturation 96%, highest heart rate 75. Pulmonary function testing FEV1 87% FEV1/FVC 93% ERV 21% DLCO uncorrected 71% Impression: No significant obstruction, restriction, no diffusion abnormality, severe reduction in ERV could be related to obesity (central obesity), clinical correlation advised.  (The following images and results were reviewed by Dr. Stevenson Clinch on 04/24/2016).  CXR 08/17/15 The heart size and mediastinal contours are within normal limits. Both lungs are clear. The visualized skeletal structures are unremarkable.  IMPRESSION: No active cardiopulmonary disease.    ECHO 06/2015 Study Conclusions  - Left ventricle: The cavity size was normal. Wall thickness was increased in a pattern of mild LVH. Systolic function was vigorous. The estimated ejection fraction was in the range of 65% to 70%. Wall motion was normal; there were no regional wall motion abnormalities. Doppler parameters are consistent with abnormal left ventricular  relaxation (grade 1 diastolic dysfunction). - Mitral valve: There was trivial regurgitation. - Right ventricle: The cavity size was normal. Wall thickness was normal. Systolic function was normal. - Inferior vena cava: The vessel was dilated. The respirophasic diameter changes were blunted (< 50%), consistent with elevated central venous pressure.  Assessment and Plan: 50 year old female seen in follow-up for asthma Asthma Patient with known history of asthma since 2004 and with recurrent bronchitis infections. Her asthma is under better control, since starting Dulera/Spiriva, and control of her chronic sinusitis issues by ENT. Triggers at this time could be weather changes and allergies.  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 Overall, today patient with good control moderate persistent asthma, currently using albuterol on average twice per week, this could be improve more.  Today patient is improving from a recent mild exacerbation of asthma due to rapid weather changes and exposure to cat dander.  Plan: - Continue with Spiriva Respimat 2.5 g, dulera, and rescue inhaler - Continue with allergy regimen as prescribed by ENT - no need for steroids today       Updated Medication List Outpatient Encounter Prescriptions as of 04/24/2016  Medication Sig  . albuterol (PROVENTIL HFA;VENTOLIN HFA) 108 (90 BASE) MCG/ACT inhaler 2 puffs every  3-4 hours as needed for shortness of breath\wheezing\recurrent cough  . albuterol (PROVENTIL) (2.5 MG/3ML) 0.083% nebulizer solution Take 3 mLs (2.5 mg total) by nebulization every 4 (four) hours as needed.  . baclofen (LIORESAL) 10 MG tablet Take 1 tablet (10 mg total) by mouth 3 (three) times daily as needed (headaches).  Marland Kitchen BIOTIN PO Take 2,000 mg by mouth daily.  . cephALEXin (KEFLEX) 500 MG capsule Take 1 capsule (500 mg total) by mouth 4 (four) times  daily.  . cetirizine (ZYRTEC) 10 MG tablet Take 1 tablet (10 mg total) by mouth daily.  . Cholecalciferol 50000 UNITS TABS Take 1 tablet by mouth once a week. For 8 weeks  . citalopram (CELEXA) 40 MG tablet Take 40 mg by mouth daily.  . clarithromycin (BIAXIN) 500 MG tablet Take 1 tablet (500 mg total) by mouth 2 (two) times daily.  Marland Kitchen esomeprazole (NEXIUM) 40 MG capsule Take 1 capsule (40 mg total) by mouth daily.  Marland Kitchen estradiol (ESTRACE) 0.1 MG/GM vaginal cream Apply 1 gram per vagina every night for 2 weeks, then apply three times a week  . estrogen, conjugated,-medroxyprogesterone (PREMPRO) 0.3-1.5 MG per tablet Take 1 tablet by mouth daily.  . fluticasone (FLONASE) 50 MCG/ACT nasal spray PLACE 2 SPRAYS INTO THE NOSE DAILY.  . furosemide (LASIX) 20 MG tablet Take 1 tablet (20 mg total) by mouth daily.  Marland Kitchen ibuprofen (ADVIL,MOTRIN) 600 MG tablet Take 1 tablet (600 mg total) by mouth 3 (three) times daily.  Marland Kitchen ibuprofen (ADVIL,MOTRIN) 600 MG tablet Take 1 tablet (600 mg total) by mouth every 6 (six) hours as needed.  . mometasone-formoterol (DULERA) 200-5 MCG/ACT AERO Inhale 2 puffs into the lungs 2 (two) times daily.  . montelukast (SINGULAIR) 10 MG tablet Take 1 tablet (10 mg total) by mouth at bedtime.  Marland Kitchen oxybutynin (DITROPAN-XL) 10 MG 24 hr tablet Take 1 tablet (10 mg total) by mouth at bedtime.  . phenazopyridine (PYRIDIUM) 95 MG tablet Take 1 tablet (95 mg total) by mouth 3 (three) times daily as needed for pain.  . promethazine (PHENERGAN) 25 MG tablet Take 1 tablet (25 mg total) by mouth every 8 (eight) hours as needed for nausea or vomiting.  . propranolol ER (INDERAL LA) 80 MG 24 hr capsule Take 1 capsule (80 mg total) by mouth daily.  . Pyridoxine HCl (VITAMIN B-6 PO) Take 1 tablet by mouth daily. Unknown OTC strength  . simvastatin (ZOCOR) 40 MG tablet Take 1 tablet (40 mg total) by mouth at bedtime.  Marland Kitchen Spacer/Aero-Holding Chambers (AEROCHAMBER MV) inhaler Use as instructed  . Tiotropium  Bromide Monohydrate (SPIRIVA RESPIMAT) 2.5 MCG/ACT AERS Inhale 2 puffs into the lungs daily.  . traZODone (DESYREL) 50 MG tablet Take 50 mg by mouth at bedtime as needed for sleep. Reported on 12/26/2015  . verapamil (CALAN) 40 MG tablet Take 40 mg by mouth daily. For palpitations/arrhythmias  . vitamin C (ASCORBIC ACID) 500 MG tablet Take 1,000 mg by mouth daily.  Marland Kitchen zolmitriptan (ZOMIG) 5 MG tablet Take 1 tablet (5 mg total) by mouth as needed. For migraine   No facility-administered encounter medications on file as of 04/24/2016.    Orders for this visit: No orders of the defined types were placed in this encounter.    Thank  you for the visitation and for allowing  [facility] to assist in the care of your patient. Our recommendations are noted above.  Please contact us if we can be of further service.  Tressy Kunzman  Stevenson Clinch, MD [facility] Office Number: [phone]

## 2016-04-24 NOTE — Patient Instructions (Signed)
Follow up with Dr. Stevenson Clinch in:2 months - cont with current inhalers - use rescue inhaler as needed - avoid allergens - cat hair, etc.

## 2016-04-24 NOTE — Assessment & Plan Note (Signed)
Patient with known history of asthma since 2004 and with recurrent bronchitis infections. Her asthma is under better control, since starting Dulera/Spiriva, and control of her chronic sinusitis issues by ENT. Triggers at this time could be weather changes and allergies.  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 Overall, today patient with good control moderate persistent asthma, currently using albuterol on average twice per week, this could be improve more.  Today patient is improving from a recent mild exacerbation of asthma due to rapid weather changes and exposure to cat dander.  Plan: - Continue with Spiriva Respimat 2.5 g, dulera, and rescue inhaler - Continue with allergy regimen as prescribed by ENT - no need for steroids today

## 2016-05-02 ENCOUNTER — Encounter: Payer: Self-pay | Admitting: Obstetrics & Gynecology

## 2016-05-02 ENCOUNTER — Ambulatory Visit (INDEPENDENT_AMBULATORY_CARE_PROVIDER_SITE_OTHER): Payer: 59 | Admitting: Obstetrics & Gynecology

## 2016-05-02 VITALS — BP 137/84 | HR 81 | Wt 192.4 lb

## 2016-05-02 DIAGNOSIS — N898 Other specified noninflammatory disorders of vagina: Secondary | ICD-10-CM | POA: Diagnosis not present

## 2016-05-02 DIAGNOSIS — Z9889 Other specified postprocedural states: Secondary | ICD-10-CM

## 2016-05-02 DIAGNOSIS — N800 Endometriosis of uterus: Secondary | ICD-10-CM | POA: Diagnosis not present

## 2016-05-02 DIAGNOSIS — R102 Pelvic and perineal pain: Secondary | ICD-10-CM

## 2016-05-02 DIAGNOSIS — N8003 Adenomyosis of the uterus: Secondary | ICD-10-CM

## 2016-05-02 DIAGNOSIS — N939 Abnormal uterine and vaginal bleeding, unspecified: Secondary | ICD-10-CM

## 2016-05-02 DIAGNOSIS — N809 Endometriosis, unspecified: Secondary | ICD-10-CM

## 2016-05-02 NOTE — Progress Notes (Signed)
CLINIC ENCOUNTER NOTE  History:  50 y.o. [id number] here today for discussion of RLQ pain and some spotting.  Was evaluated for RLQ pain by her PCP; this happened during a recent pyelonephritis episode. Pain was on the right groin area, radiating to her hip and down her leg.  Pain was so severe she was taking Ibuprofen for comfort.   Spotting occurred over the last month; sporadically notices brown and pink discharge when she wipes. Of note, patient had endometrial ablation in 2013, no bleeding since then until recent spotting. Her PCP told her her symptoms may be caused by adenomyosis, which was the impression of a recent pelvic ultrasound (patient has had adenomyosis on several scans since 2010 in [facility]). She denies any abnormal vaginal discharge or other concerns. No current pain; pain has decreased substantially since kidney infection as per patient.  Past Medical History  Diagnosis Date  . Migraines   . Asthma   . Anemia   . Anxiety   . Depression   . PONV (postoperative nausea and vomiting)   . Dysrhythmia     Irregular heat w/ asthma episodes and anxiety  . Sleep apnea     uses CPAP  . GERD (gastroesophageal reflux disease)   . Urinary incontinence     frequent urination- tx w/ vesicare    Past Surgical History  Procedure Laterality Date  . Ablation    . Cesarean section      x3  . Mab      x 4  . Dilation and curettage of uterus  05/31/2005    MAB, TAB x 2  . Tubal ligation  11/01/2006  . Leep    . Hernia repair  1990's  . Wisdom tooth extraction    . Mouth surgery      teeth extraction  . Hysteroscopy w/ endometrial ablation      The following portions of the patient's history were reviewed and updated as appropriate: allergies, current medications, past family history, past medical history, past social history, past surgical history and problem list.   Health Maintenance:  Normal pap and negative HRHPV on 01/14/15.  Normal mammogram on 12/13/15.   Review of Systems:    Pertinent items noted in HPI and remainder of comprehensive ROS otherwise negative.  Objective:  Physical Exam BP 137/84 mmHg  Pulse 81  Wt 192 lb 6.4 oz (87.272 kg)  LMP 12/30/2014 (Exact Date) CONSTITUTIONAL: Well-developed, well-nourished female in no acute distress.  HENT:  Normocephalic, atraumatic. External right and left ear normal. Oropharynx is clear and moist EYES: Conjunctivae and EOM are normal. Pupils are equal, round, and reactive to light. No scleral icterus.  NECK: Normal range of motion, supple, no masses SKIN: Skin is warm and dry. No rash noted. Not diaphoretic. No erythema. No pallor. NEUROLOGIC: Alert and oriented to person, place, and time. Normal reflexes, muscle tone coordination. No cranial nerve deficit noted. PSYCHIATRIC: Normal mood and affect. Normal behavior. Normal judgment and thought content. CARDIOVASCULAR: Normal heart rate noted RESPIRATORY: Effort and breath sounds normal, no problems with respiration noted ABDOMEN: Soft, no distention noted.  No current RLQ pain elicited. PELVIC: Deferred MUSCULOSKELETAL: Normal range of motion. No edema noted.  Labs and Imaging US Transvaginal Non-ob  04/13/2016  CLINICAL DATA:  Right adnexal pain. EXAM: TRANSABDOMINAL AND TRANSVAGINAL ULTRASOUND OF PELVIS TECHNIQUE: Both transabdominal and transvaginal ultrasound examinations of the pelvis were performed. Transabdominal technique was performed for global imaging of the pelvis including uterus, ovaries, adnexal regions, and pelvic  cul-de-sac. It was necessary to proceed with endovaginal exam following the transabdominal exam to visualize the endometrium and adnexal regions. COMPARISON:  CT scan 03/14/2016 FINDINGS: Uterus Measurements: 10.2 x 6.1 x 7.7 cm. Myometrial echotexture is markedly heterogeneous suggesting underlying adenomyosis. There may be a small anterior sub serosal fibroid measuring up to about 2.6 cm. Endometrium Thickness: 8 mm.  No focal abnormality  visualized. Right ovary Measurements: 2.5 x 1.6 x 1.5 cm. Best seen transabdominally. Normal sonographic appearance. Left ovary Measurements: 2.3 x 1.7 x 1.7 cm. Seen only on transabdominal scanning. Normal ultrasound features. Other findings No abnormal free fluid. IMPRESSION: Markedly heterogeneous myometrial echotexture suggests underlying adenomyosis. Question small anterior fibroid. No evidence for adnexal mass. Electronically Signed   By: Misty Stanley M.D.   On: 04/13/2016 09:09   US Pelvis Complete  04/13/2016  CLINICAL DATA:  Right adnexal pain. EXAM: TRANSABDOMINAL AND TRANSVAGINAL ULTRASOUND OF PELVIS TECHNIQUE: Both transabdominal and transvaginal ultrasound examinations of the pelvis were performed. Transabdominal technique was performed for global imaging of the pelvis including uterus, ovaries, adnexal regions, and pelvic cul-de-sac. It was necessary to proceed with endovaginal exam following the transabdominal exam to visualize the endometrium and adnexal regions. COMPARISON:  CT scan 03/14/2016 FINDINGS: Uterus Measurements: 10.2 x 6.1 x 7.7 cm. Myometrial echotexture is markedly heterogeneous suggesting underlying adenomyosis. There may be a small anterior sub serosal fibroid measuring up to about 2.6 cm. Endometrium Thickness: 8 mm.  No focal abnormality visualized. Right ovary Measurements: 2.5 x 1.6 x 1.5 cm. Best seen transabdominally. Normal sonographic appearance. Left ovary Measurements: 2.3 x 1.7 x 1.7 cm. Seen only on transabdominal scanning. Normal ultrasound features. Other findings No abnormal free fluid. IMPRESSION: Markedly heterogeneous myometrial echotexture suggests underlying adenomyosis. Question small anterior fibroid. No evidence for adnexal mass. Electronically Signed   By: Misty Stanley M.D.   On: 04/13/2016 09:09    Assessment & Plan:  1. Pelvic pain in female 2. Adenomyosis Pain is not very likely due to adenomyosis which has been noted and stable for several  years.   Radiation of pain suggests inflammation or musculoskeletal. Advised to take NSAIDs as prescribed, continue Baclofen.   3. Vaginal spotting 4. History of endometrial ablation Patient reassured that this can occur 4-5 years after endometrial ablation. She was told that if this gets worse, she may need sampling +/- repeat ablation or hysterectomy. 31mm homogenous EM on recent scan is reassuring. Will continue to observe. Bleeding precautions advised.  Routine preventative health maintenance measures emphasized. Please refer to After Visit Summary for other counseling recommendations.   Return if symptoms worsen or fail to improve.   Total face-to-face time with patient: 25 minutes. Over 50% of encounter was spent on counseling and coordination of care.   Verita Schneiders, MD, [place] Attending Obstetrician & Gynecologist, [place] [facility]

## 2016-05-02 NOTE — Patient Instructions (Signed)
Return to clinic for any scheduled appointments or for any gynecologic concerns as needed.   

## 2016-05-28 ENCOUNTER — Ambulatory Visit (INDEPENDENT_AMBULATORY_CARE_PROVIDER_SITE_OTHER): Payer: 59 | Admitting: Internal Medicine

## 2016-05-28 ENCOUNTER — Encounter: Payer: Self-pay | Admitting: Internal Medicine

## 2016-05-28 VITALS — BP 140/80 | HR 76 | Temp 98.2°F | Ht 61.0 in | Wt 194.0 lb

## 2016-05-28 DIAGNOSIS — G43809 Other migraine, not intractable, without status migrainosus: Secondary | ICD-10-CM

## 2016-05-28 DIAGNOSIS — K219 Gastro-esophageal reflux disease without esophagitis: Secondary | ICD-10-CM

## 2016-05-28 MED ORDER — ESOMEPRAZOLE MAGNESIUM 40 MG PO CPDR: 40.0000 mg | DELAYED_RELEASE_CAPSULE | Freq: Every day | ORAL | 6 refills | Status: DC

## 2016-05-28 MED ORDER — KETOROLAC TROMETHAMINE 30 MG/ML IM SOLN: 30.0000 mg | Freq: Once | INTRAMUSCULAR | 0 refills | Status: DC

## 2016-05-28 MED ORDER — KETOROLAC TROMETHAMINE 30 MG/ML IJ SOLN
30.0000 mg | Freq: Once | INTRAMUSCULAR | Status: AC
  Administered 2016-05-28: 30 mg via INTRAMUSCULAR

## 2016-05-28 NOTE — Progress Notes (Signed)
[facility] Progress Note  Subjective:  [name] is a 50-y/o female with history of migraines who presents with migraine x 3 days.  Migraine: - Patient woke up with HA on Saturday. HA started in the back of her head with neck pain, which is typical of her normal migraines.  - Took baclofen and zomig with some relief - Later that day developed pain above her right eye but denies vision changes - Went to a blood drive on Saturday at her church and was told she did not have anemia or HTN - Continued to have headache with occasional tearing of her right eye yesterday despite resting most of the day - Has not taken more than 1 dose of zomig - Continues to have headache with sensation of "pulling" across her right cheek and did not feel like she could work today - Stress makes it worse, not lights or sounds ROS: No nausea or vomiting, no falls  Social: Former smoker  Objective: Blood pressure 140/80, pulse 76, temperature 98.2 F (36.8 C), temperature source Oral, height 5\' 1"  (1.549 m), weight 194 lb (88 kg), SpO2 99 %. Constitutional: Obese female in mild distress HENT: PERRLA, EOMI Cardiovascular: RRR, S1, S2, no m/r/g.  Pulmonary/Chest: Effort normal and breath sounds normal. No respiratory distress.  Abdominal: Soft. +BS, soft, NT, ND, no rebound or guarding.  Musculoskeletal: No TTP over C-spine or upper back Neurological: AOx3, CNII-XII intact. Skin: Skin is warm and dry. No rash noted. No erythema.  Psychiatric: Normal mood and affect.  Vitals reviewed  Assessment/Plan: Migraine - Gave 30 mg toradol shot IM with improvement in symptoms - Advised patient to take another dose of zolmitriptan when she got home if she is still having headache - Counseled patient she could take more than one dose of zolmitriptan in a day (up to 10 mg in a day) if she is developing a migraine  Follow-up as needed. Provided strict return precautions.   Olene Floss,  MD [place], PGY-2

## 2016-05-28 NOTE — Patient Instructions (Signed)
[name],  You received a dose of toradol, a strong non-steroidal anti-inflammatory medication.  Please continue your muscle relaxant and anti-nausea medication as needed.   You can take up to 10 mg of zomig as needed a day.   Please call clinic if your headache does not break over the next few days, continuing to take zomig as needed.  Best, Dr. Ola Spurr

## 2016-05-30 NOTE — Assessment & Plan Note (Signed)
-   Gave 30 mg toradol shot IM with improvement in symptoms - Advised patient to take another dose of zolmitriptan when she got home if she is still having headache - Counseled patient she could take more than one dose of zolmitriptan in a day (up to 10 mg in a day) if she is developing a migraine

## 2016-06-26 ENCOUNTER — Encounter: Payer: Self-pay | Admitting: Internal Medicine

## 2016-06-26 ENCOUNTER — Ambulatory Visit (INDEPENDENT_AMBULATORY_CARE_PROVIDER_SITE_OTHER): Payer: 59 | Admitting: Internal Medicine

## 2016-06-26 ENCOUNTER — Ambulatory Visit: Payer: Self-pay | Admitting: Internal Medicine

## 2016-06-26 VITALS — BP 128/74 | HR 77 | Temp 98.1°F | Ht 61.0 in | Wt 196.0 lb

## 2016-06-26 DIAGNOSIS — J4540 Moderate persistent asthma, uncomplicated: Secondary | ICD-10-CM

## 2016-06-26 MED ORDER — PREDNISONE 20 MG PO TABS: 20.0000 mg | ORAL_TABLET | Freq: Every day | ORAL | 0 refills | Status: DC

## 2016-06-26 MED FILL — predniSONE 20 MG TABS: 20 | 5 days supply | Qty: 5 | Fill #0

## 2016-06-26 NOTE — Progress Notes (Signed)
MRN# [id number] [name] Mar 07, 1966   CC: Chief Complaint  Patient presents with  . Follow-up    pt c/o sob, prod cough with yellow mucus, sinus congestion, PND X3 days.        Brief History: 50 -year-old female with history of mild to moderate persistent asthma, triggered by rapid weather changes, CAT dander, perfumes.  Events since last clinic visit: Presents today for a follow up of asthma Since her last visit, she has been doing overall well, Until 3 days ago when she started having increased sputum production, shortness of breath due to the rapid weather changes. She states that since then she's been using her albuterol about 2 times per day, and has to give herself a nebulizer treatment this morning. She also noted mild wheezing started this morning. She denies any fever, nausea, vomiting, diarrhea.  Medication:    Current Outpatient Prescriptions:  .  albuterol (PROVENTIL HFA;VENTOLIN HFA) 108 (90 BASE) MCG/ACT inhaler, 2 puffs every 3-4 hours as needed for shortness of breath\wheezing\recurrent cough, Disp: 1 Inhaler, Rfl: 6 .  albuterol (PROVENTIL) (2.5 MG/3ML) 0.083% nebulizer solution, Take 3 mLs (2.5 mg total) by nebulization every 4 (four) hours as needed., Disp: 75 mL, Rfl: 6 .  baclofen (LIORESAL) 10 MG tablet, Take 1 tablet (10 mg total) by mouth 3 (three) times daily as needed (headaches)., Disp: 60 each, Rfl: 1 .  BIOTIN PO, Take 2,000 mg by mouth daily., Disp: , Rfl:  .  cephALEXin (KEFLEX) 500 MG capsule, Take 1 capsule (500 mg total) by mouth 4 (four) times daily., Disp: 52 capsule, Rfl: 0 .  cetirizine (ZYRTEC) 10 MG tablet, Take 1 tablet (10 mg total) by mouth daily., Disp: 30 tablet, Rfl: 11 .  Cholecalciferol 50000 UNITS TABS, Take 1 tablet by mouth once a week. For 8 weeks, Disp: 8 tablet, Rfl: 0 .  citalopram (CELEXA) 40 MG tablet, Take 40 mg by mouth daily., Disp: , Rfl:  .  clarithromycin (BIAXIN) 500 MG tablet, Take 1 tablet (500 mg total) by mouth 2  (two) times daily., Disp: 28 tablet, Rfl: 0 .  esomeprazole (NEXIUM) 40 MG capsule, Take 1 capsule (40 mg total) by mouth daily., Disp: 30 capsule, Rfl: 6 .  estradiol (ESTRACE) 0.1 MG/GM vaginal cream, Apply 1 gram per vagina every night for 2 weeks, then apply three times a week, Disp: 30 g, Rfl: 12 .  estrogen, conjugated,-medroxyprogesterone (PREMPRO) 0.3-1.5 MG per tablet, Take 1 tablet by mouth daily., Disp: 30 tablet, Rfl: 12 .  fluticasone (FLONASE) 50 MCG/ACT nasal spray, PLACE 2 SPRAYS INTO THE NOSE DAILY., Disp: 16 g, Rfl: 6 .  furosemide (LASIX) 20 MG tablet, Take 1 tablet (20 mg total) by mouth daily., Disp: 30 tablet, Rfl: 3 .  ibuprofen (ADVIL,MOTRIN) 600 MG tablet, Take 1 tablet (600 mg total) by mouth 3 (three) times daily., Disp: 20 tablet, Rfl: 0 .  ibuprofen (ADVIL,MOTRIN) 600 MG tablet, Take 1 tablet (600 mg total) by mouth every 6 (six) hours as needed., Disp: 30 tablet, Rfl: 0 .  mometasone-formoterol (DULERA) 200-5 MCG/ACT AERO, Inhale 2 puffs into the lungs 2 (two) times daily., Disp: 8.8 g, Rfl: 1 .  montelukast (SINGULAIR) 10 MG tablet, Take 1 tablet (10 mg total) by mouth at bedtime., Disp: 90 tablet, Rfl: 3 .  oxybutynin (DITROPAN-XL) 10 MG 24 hr tablet, Take 1 tablet (10 mg total) by mouth at bedtime., Disp: 30 tablet, Rfl: 0 .  phenazopyridine (PYRIDIUM) 95 MG tablet, Take 1 tablet (  95 mg total) by mouth 3 (three) times daily as needed for pain., Disp: 30 tablet, Rfl: 1 .  promethazine (PHENERGAN) 25 MG tablet, Take 1 tablet (25 mg total) by mouth every 8 (eight) hours as needed for nausea or vomiting., Disp: 20 tablet, Rfl: 1 .  propranolol ER (INDERAL LA) 80 MG 24 hr capsule, Take 1 capsule (80 mg total) by mouth daily., Disp: 30 capsule, Rfl: 2 .  Pyridoxine HCl (VITAMIN B-6 PO), Take 1 tablet by mouth daily. Unknown OTC strength, Disp: , Rfl:  .  simvastatin (ZOCOR) 40 MG tablet, Take 1 tablet (40 mg total) by mouth at bedtime., Disp: 90 tablet, Rfl: 3 .   Spacer/Aero-Holding Chambers (AEROCHAMBER MV) inhaler, Use as instructed, Disp: 1 each, Rfl: 0 .  Tiotropium Bromide Monohydrate (SPIRIVA RESPIMAT) 2.5 MCG/ACT AERS, Inhale 2 puffs into the lungs daily., Disp: 4 g, Rfl: 3 .  traZODone (DESYREL) 50 MG tablet, Take 50 mg by mouth at bedtime as needed for sleep. Reported on 12/26/2015, Disp: , Rfl:  .  verapamil (CALAN) 40 MG tablet, Take 40 mg by mouth daily. For palpitations/arrhythmias, Disp: , Rfl:  .  vitamin C (ASCORBIC ACID) 500 MG tablet, Take 1,000 mg by mouth daily., Disp: , Rfl:  .  zolmitriptan (ZOMIG) 5 MG tablet, Take 1 tablet (5 mg total) by mouth as needed. For migraine, Disp: 10 tablet, Rfl: 2 .  predniSONE (DELTASONE) 20 MG tablet, Take 1 tablet (20 mg total) by mouth daily., Disp: 5 tablet, Rfl: 0    Review of Systems: Gen:  Denies  fever, sweats, chills HEENT: Denies blurred vision, double vision, ear pain, eye pain. Admits to sore throat, nasal drainage/congestion Cvc:  No dizziness, chest pain or heaviness Resp:   Admits to: cough, sinus congestion, Intermittent productive cough, using albuetrol BID Gi: Denies swallowing difficulty, stomach pain, nausea or vomiting, diarrhea, constipation, bowel incontinence Gu:  Denies bladder incontinence, burning urine Ext:   No Joint pain, stiffness or swelling Skin: No skin rash, easy bruising or bleeding or hives Endoc:  No polyuria, polydipsia , polyphagia or weight change Other:  All other systems negative  Allergies:  Amitriptyline; Fluticasone-salmeterol; Penicillins; Ciprofloxacin; Macrobid [nitrofurantoin monohyd macro]; and Sulfamethoxazole-trimethoprim  Physical Examination:  VS: BP 128/74 (BP Location: Right Arm, Cuff Size: Normal)   Pulse 77   Temp 98.1 F (36.7 C) (Oral)   Ht 5\' 1"  (1.549 m)   Wt 196 lb (88.9 kg)   SpO2 96%   BMI 37.03 kg/m   General Appearance: No distress  HEENT: PERRLA, no ptosis, no other lesions noticed. Mild Erythema of  oropharynx Pulmonary:shallow BS at bases, mild upper lobes expiratory wheezing.  Cardiovascular:  Normal S1,S2.  No m/r/g.     Abdomen:Exam: Benign, Soft, non-tender, No masses  Skin:   warm, no rashes, no ecchymosis  Extremities: normal, no cyanosis, clubbing, warm with normal capillary refill.      6 minute walk tests 06/02/2015 -Distance 974 feet/297 m, low saturation 96%, highest heart rate 75. Pulmonary function testing FEV1 87% FEV1/FVC 93% ERV 21% DLCO uncorrected 71% Impression: No significant obstruction, restriction, no diffusion abnormality, severe reduction in ERV could be related to obesity (central obesity), clinical correlation advised.  (The following images and results were reviewed by Dr. Stevenson Clinch on 06/26/2016).  CXR 08/17/15 The heart size and mediastinal contours are within normal limits. Both lungs are clear. The visualized skeletal structures are unremarkable.  IMPRESSION: No active cardiopulmonary disease.    ECHO 06/2015 Study Conclusions  -  Left ventricle: The cavity size was normal. Wall thickness was increased in a pattern of mild LVH. Systolic function was vigorous. The estimated ejection fraction was in the range of 65% to 70%. Wall motion was normal; there were no regional wall motion abnormalities. Doppler parameters are consistent with abnormal left ventricular relaxation (grade 1 diastolic dysfunction). - Mitral valve: There was trivial regurgitation. - Right ventricle: The cavity size was normal. Wall thickness was normal. Systolic function was normal. - Inferior vena cava: The vessel was dilated. The respirophasic diameter changes were blunted (< 50%), consistent with elevated central venous pressure.  Assessment and Plan: 50 year old female seen in follow-up for asthma Asthma Patient with known history of asthma since 2004 and with recurrent bronchitis infections. Her asthma is under better control, since starting  Dulera/Spiriva, and control of her chronic sinusitis issues by ENT. Triggers at this time could be weather changes and allergies.  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 Overall, today patient with good control moderate persistent asthma, currently using albuterol on average twice per week, this could be improve more.  Today with mild asthma exacerbation, will treat has such  Plan: - Continue with Spiriva Respimat 2.5 g, dulera, and rescue inhaler - Continue with allergy regimen as prescribed by ENT - Prednisone 20mg  x 5 days and scheduled albuterol for 3 days.       Updated Medication List Outpatient Encounter Prescriptions as of 06/26/2016  Medication Sig  . albuterol (PROVENTIL HFA;VENTOLIN HFA) 108 (90 BASE) MCG/ACT inhaler 2 puffs every 3-4 hours as needed for shortness of breath\wheezing\recurrent cough  . albuterol (PROVENTIL) (2.5 MG/3ML) 0.083% nebulizer solution Take 3 mLs (2.5 mg total) by nebulization every 4 (four) hours as needed.  . baclofen (LIORESAL) 10 MG tablet Take 1 tablet (10 mg total) by mouth 3 (three) times daily as needed (headaches).  Marland Kitchen BIOTIN PO Take 2,000 mg by mouth daily.  . cephALEXin (KEFLEX) 500 MG capsule Take 1 capsule (500 mg total) by mouth 4 (four) times daily.  . cetirizine (ZYRTEC) 10 MG tablet Take 1 tablet (10 mg total) by mouth daily.  . Cholecalciferol 50000 UNITS TABS Take 1 tablet by mouth once a week. For 8 weeks  . citalopram (CELEXA) 40 MG tablet Take 40 mg by mouth daily.  . clarithromycin (BIAXIN) 500 MG tablet Take 1 tablet (500 mg total) by mouth 2 (two) times daily.  Marland Kitchen esomeprazole (NEXIUM) 40 MG capsule Take 1 capsule (40 mg total) by mouth daily.  Marland Kitchen estradiol (ESTRACE) 0.1 MG/GM vaginal cream Apply 1 gram per vagina every night for 2 weeks, then apply three times a week  . estrogen, conjugated,-medroxyprogesterone (PREMPRO) 0.3-1.5 MG per tablet  Take 1 tablet by mouth daily.  . fluticasone (FLONASE) 50 MCG/ACT nasal spray PLACE 2 SPRAYS INTO THE NOSE DAILY.  . furosemide (LASIX) 20 MG tablet Take 1 tablet (20 mg total) by mouth daily.  Marland Kitchen ibuprofen (ADVIL,MOTRIN) 600 MG tablet Take 1 tablet (600 mg total) by mouth 3 (three) times daily.  Marland Kitchen ibuprofen (ADVIL,MOTRIN) 600 MG tablet Take 1 tablet (600 mg total) by mouth every 6 (six) hours as needed.  . mometasone-formoterol (DULERA) 200-5 MCG/ACT AERO Inhale 2 puffs into the lungs 2 (two) times daily.  . montelukast (SINGULAIR) 10 MG tablet Take 1 tablet (10 mg total) by mouth at bedtime.  Marland Kitchen oxybutynin (DITROPAN-XL) 10 MG 24 hr tablet Take 1 tablet (10 mg total)  by mouth at bedtime.  . phenazopyridine (PYRIDIUM) 95 MG tablet Take 1 tablet (95 mg total) by mouth 3 (three) times daily as needed for pain.  . promethazine (PHENERGAN) 25 MG tablet Take 1 tablet (25 mg total) by mouth every 8 (eight) hours as needed for nausea or vomiting.  . propranolol ER (INDERAL LA) 80 MG 24 hr capsule Take 1 capsule (80 mg total) by mouth daily.  . Pyridoxine HCl (VITAMIN B-6 PO) Take 1 tablet by mouth daily. Unknown OTC strength  . simvastatin (ZOCOR) 40 MG tablet Take 1 tablet (40 mg total) by mouth at bedtime.  Marland Kitchen Spacer/Aero-Holding Chambers (AEROCHAMBER MV) inhaler Use as instructed  . Tiotropium Bromide Monohydrate (SPIRIVA RESPIMAT) 2.5 MCG/ACT AERS Inhale 2 puffs into the lungs daily.  . traZODone (DESYREL) 50 MG tablet Take 50 mg by mouth at bedtime as needed for sleep. Reported on 12/26/2015  . verapamil (CALAN) 40 MG tablet Take 40 mg by mouth daily. For palpitations/arrhythmias  . vitamin C (ASCORBIC ACID) 500 MG tablet Take 1,000 mg by mouth daily.  Marland Kitchen zolmitriptan (ZOMIG) 5 MG tablet Take 1 tablet (5 mg total) by mouth as needed. For migraine  . predniSONE (DELTASONE) 20 MG tablet Take 1 tablet (20 mg total) by mouth daily.   No facility-administered encounter medications on file as of 06/26/2016.      Orders for this visit: No orders of the defined types were placed in this encounter.   Thank  you for the visitation and for allowing  [facility] to assist in the care of your patient. Our recommendations are noted above.  Please contact us if we can be of further service.  Vilinda Boehringer, MD [facility] Office Number: [phone]

## 2016-06-26 NOTE — Patient Instructions (Addendum)
Follow up with Dr. Stevenson Clinch in:2 months - cont with your current inhalers - Prednisone 20 mg - 1 tab PO with breakfast x 5 days.  - albuterol inhaler - 2puff every 6 hours for 3 days, then 2puff as needed every 3-4 hours as needed for shortness of breath\wheezing\recurrent cough - cont with allergy avoidance - we will give you an Rx for an aerochamber

## 2016-06-26 NOTE — Assessment & Plan Note (Signed)
Patient with known history of asthma since 2004 and with recurrent bronchitis infections. Her asthma is under better control, since starting Dulera/Spiriva, and control of her chronic sinusitis issues by ENT. Triggers at this time could be weather changes and allergies.  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 Overall, today patient with good control moderate persistent asthma, currently using albuterol on average twice per week, this could be improve more.  Today with mild asthma exacerbation, will treat has such  Plan: - Continue with Spiriva Respimat 2.5 g, dulera, and rescue inhaler - Continue with allergy regimen as prescribed by ENT - Prednisone 20mg  x 5 days and scheduled albuterol for 3 days.

## 2016-06-28 ENCOUNTER — Encounter: Payer: Self-pay | Admitting: Internal Medicine

## 2016-06-28 MED FILL — ESOMEPRAZOLE MAG DR 40 MG C: 40 | 30 days supply | Qty: 30 | Fill #0

## 2016-07-16 ENCOUNTER — Encounter: Payer: Self-pay | Admitting: Internal Medicine

## 2016-07-17 ENCOUNTER — Ambulatory Visit: Payer: Self-pay | Admitting: Internal Medicine

## 2016-08-01 ENCOUNTER — Encounter: Payer: Self-pay | Admitting: Family Medicine

## 2016-08-01 ENCOUNTER — Ambulatory Visit (INDEPENDENT_AMBULATORY_CARE_PROVIDER_SITE_OTHER): Payer: 59 | Admitting: Family Medicine

## 2016-08-01 VITALS — BP 146/82 | HR 68 | Temp 98.1°F | Ht 61.0 in | Wt 196.6 lb

## 2016-08-01 DIAGNOSIS — M25562 Pain in left knee: Secondary | ICD-10-CM

## 2016-08-01 DIAGNOSIS — Z23 Encounter for immunization: Secondary | ICD-10-CM | POA: Diagnosis not present

## 2016-08-01 DIAGNOSIS — M545 Low back pain, unspecified: Secondary | ICD-10-CM

## 2016-08-01 DIAGNOSIS — I10 Essential (primary) hypertension: Secondary | ICD-10-CM

## 2016-08-01 DIAGNOSIS — Z0000 Encounter for general adult medical examination without abnormal findings: Secondary | ICD-10-CM

## 2016-08-01 DIAGNOSIS — M25561 Pain in right knee: Secondary | ICD-10-CM

## 2016-08-01 MED ORDER — NAPROXEN 500 MG PO TABS: 500.0000 mg | ORAL_TABLET | Freq: Two times a day (BID) | ORAL | 0 refills | Status: DC

## 2016-08-01 NOTE — Progress Notes (Signed)
Subjective:   [name] is a 50 y.o. female with a history of Chronic migraines, OSA, asthma, HLD here for well woman/preventative visit  Acute Concerns:  Nasal congestion and scratchy throat - taking zyrtec and nasal spray  Low back pain - Worse after sitting for a long time - b/l knees get stiff after sitting for a long time - yesterday - picked up patient and felt burning in low back - somewhat worse today - trying to exercise more - taking ibuprofen and tylenol - duration: 1 month - feels better after walking - muscle relaxer made her too drowsy - warm and cold compresses - Doesn't radiate - Denies fevers, chills, lower extremity weakness, decreased sensation, history of cancer, bowel or bladder incontinence, saddle anesthesia   Diet: not eating fried foods, more veggies, chicken, fish  Exercise: walking daily with dog  Sexual/Birth History: [id number], currently sexually active with 1 female partner  Birth Control: s/p BTL in 2008  POA/Living Will: none - working on this   Review of Systems: Per HPI.   PMH, PSH, Medications, Allergies, and FmHx reviewed and updated in EMR.  Social History: former smoker  Immunization:  Tdap/TD: needs, last in 2003  Influenza: given at work this year  Pneumococcal: needs  Herpes Zoster: n/a  Cancer Screening:  Pap Smear: normal 2016  Mammogram: neg in 11/2015  Colonoscopy: start in 10/2016  Dexa: n/a   Objective:  BP (!) 146/82   Pulse 68   Temp 98.1 F (36.7 C) (Oral)   Ht 5\' 1"  (1.549 m)   Wt 196 lb 9.6 oz (89.2 kg)   BMI 37.15 kg/m   Gen:  50 y.o. female in NAD HEENT: NCAT, MMM, EOMI, PERRL, anicteric sclerae CV: RRR, no MRG Resp: Non-labored, CTAB, no wheezes noted Abd: Soft, NTND, BS present, no guarding or organomegaly Ext: WWP, no edema MSK: back: no midline tenderness, mildly TTP over R musculature near SI joint, negative straight leg raise, strength intact in lower extremities, sensation intact to light  touch in lower extremities, normal to inspection, gait intact B/l Knee: Normal to inspection with no erythema or effusion or obvious bony abnormalities. Palpation normal with no warmth or joint line tenderness or patellar tenderness or condyle tenderness. No crepitus appreciated ROM normal in flexion and extension and lower leg rotation. Ligaments with solid consistent endpoints including ACL, PCL, LCL, MCL. Negative Mcmurray's and provocative meniscal tests. Non painful patellar compression. Patellar and quadriceps tendons unremarkable. Neuro: Alert and oriented, speech normal       Chemistry      Component Value Date/Time   NA 136 03/14/2016 0750   K 3.8 03/14/2016 0750   CL 104 03/14/2016 0750   CO2 25 03/14/2016 0750   BUN 14 03/14/2016 0750   CREATININE 0.82 03/14/2016 0750   CREATININE 0.91 12/30/2015 1600      Component Value Date/Time   CALCIUM 9.0 03/14/2016 0750   ALKPHOS 37 (L) 03/14/2016 0750   AST 16 03/14/2016 0750   ALT 15 03/14/2016 0750   BILITOT 0.7 03/14/2016 0750      Lab Results  Component Value Date   WBC 6.5 03/14/2016   HGB 13.4 03/14/2016   HCT 38.6 03/14/2016   MCV 91.9 03/14/2016   PLT 275 03/14/2016   Lab Results  Component Value Date   TSH 1.655 08/16/2015   Lab Results  Component Value Date   HGBA1C 5.9 09/09/2015    Assessment & Plan:     [name]  [name] is a 50 y.o. female here for annual well woman/preventative exam  Elevated blood pressure reading in office with diagnosis of hypertension Patient reporting pain currently and suspects this is the cause of her elevated blood pressure Has had elevated blood pressure intermittently in the past but is not on any medications Follow-up in one month and consider initiation of antihypertensive agent if continues to be elevated  Knee pain, bilateral Likely mild arthritic changes present Benign exam Advised continuing exercising as able Naproxen twice a day as needed Stop  ibuprofen Follow-up as needed  Back pain Likely MSK in origin given tenderness over musculature Likely related to lifting at job Advise continue exercising Naproxen twice a day when necessary Stop ibuprofen Return precautions discussed  Healthcare maintenance Up-to-date on Pap and mammogram Flu shot given at job Needs TDaP and Pneumovax today Information on colonoscopy given today      Virginia Crews, MD MPH PGY-3,  [facility] 08/02/2016  8:38 AM

## 2016-08-01 NOTE — Patient Instructions (Signed)

## 2016-08-02 NOTE — Assessment & Plan Note (Signed)
Likely mild arthritic changes present Benign exam Advised continuing exercising as able Naproxen twice a day as needed Stop ibuprofen Follow-up as needed

## 2016-08-02 NOTE — Assessment & Plan Note (Signed)
Patient reporting pain currently and suspects this is the cause of her elevated blood pressure Has had elevated blood pressure intermittently in the past but is not on any medications Follow-up in one month and consider initiation of antihypertensive agent if continues to be elevated

## 2016-08-02 NOTE — Assessment & Plan Note (Signed)
Up-to-date on Pap and mammogram Flu shot given at job Needs TDaP and Pneumovax today Information on colonoscopy given today

## 2016-08-02 NOTE — Progress Notes (Signed)
* [facility]     Assessment and Plan:  Severe excessive daytime sleepiness. -I suspect that she is not getting an adequate amount of total sleep time with her CPAP device. -Continue to wear her CPAP during all sleep periods including when she is napping. She also would ideally be able to get more sleep in overnight.  Obstructive sleep apnea. -Her download is not available today, will get this from [facility], to review and follow up with her.   Circadian rhythm/shift work sleep disorder. -Patient works an overnight shift, this may be contributing to her daytime sleepiness. -Discussed strategy to try to maximize her total sleep time.   Date: 08/02/2016  MRN# [id number] [name] 05/11/1966   [name] is a 50 y.o. old female seen in follow up for chief complaint of  Chief Complaint  Patient presents with  . Follow-up    pt states she wears cpap avg 6hr nightly, feels pressure & mask are ok. DME:[facility]     HPI:   The patient is a 50 year old female, who sees Dr. Stevenson Kelly here in the office for COPD/asthma. She is referred here due to check of sleep apnea. She underwent a sleep study on 12/02/15, which showed an AHI of 6, consistent with mild obstructive sleep apnea.  Currently she is sleeping with it every night. She is sleepy by 7, goes to bed around 8 pm and puts on her nasal mask, then falls asleep within about 15 min. She does not wake at night. She wakes at 2 am to her alarm to be at work by [employer], she works at [employer] at [facility] [employer].   On weekends she goes to bed by 4-5 pm, and wakes at 7am. On those days she feels better.    Medication:   Outpatient Encounter Prescriptions as of 08/03/2016  Medication Sig  . albuterol (PROVENTIL HFA;VENTOLIN HFA) 108 (90 BASE) MCG/ACT inhaler 2 puffs every 3-4 hours as needed for shortness of breath\wheezing\recurrent cough  . albuterol (PROVENTIL) (2.5 MG/3ML) 0.083% nebulizer solution Take 3 mLs (2.5 mg total) by  nebulization every 4 (four) hours as needed.  . baclofen (LIORESAL) 10 MG tablet Take 1 tablet (10 mg total) by mouth 3 (three) times daily as needed (headaches).  Marland Kitchen BIOTIN PO Take 2,000 mg by mouth daily.  . cephALEXin (KEFLEX) 500 MG capsule Take 1 capsule (500 mg total) by mouth 4 (four) times daily.  . cetirizine (ZYRTEC) 10 MG tablet Take 1 tablet (10 mg total) by mouth daily.  . Cholecalciferol 50000 UNITS TABS Take 1 tablet by mouth once a week. For 8 weeks  . citalopram (CELEXA) 40 MG tablet Take 40 mg by mouth daily.  . clarithromycin (BIAXIN) 500 MG tablet Take 1 tablet (500 mg total) by mouth 2 (two) times daily.  Marland Kitchen esomeprazole (NEXIUM) 40 MG capsule Take 1 capsule (40 mg total) by mouth daily.  Marland Kitchen estradiol (ESTRACE) 0.1 MG/GM vaginal cream Apply 1 gram per vagina every night for 2 weeks, then apply three times a week  . estrogen, conjugated,-medroxyprogesterone (PREMPRO) 0.3-1.5 MG per tablet Take 1 tablet by mouth daily.  . fluticasone (FLONASE) 50 MCG/ACT nasal spray PLACE 2 SPRAYS INTO THE NOSE DAILY.  . furosemide (LASIX) 20 MG tablet Take 1 tablet (20 mg total) by mouth daily.  . mometasone-formoterol (DULERA) 200-5 MCG/ACT AERO Inhale 2 puffs into the lungs 2 (two) times daily.  . montelukast (SINGULAIR) 10 MG tablet Take 1 tablet (10 mg total) by  mouth at bedtime.  . naproxen (NAPROSYN) 500 MG tablet Take 1 tablet (500 mg total) by mouth 2 (two) times daily with a meal.  . oxybutynin (DITROPAN-XL) 10 MG 24 hr tablet Take 1 tablet (10 mg total) by mouth at bedtime.  . phenazopyridine (PYRIDIUM) 95 MG tablet Take 1 tablet (95 mg total) by mouth 3 (three) times daily as needed for pain.  . predniSONE (DELTASONE) 20 MG tablet Take 1 tablet (20 mg total) by mouth daily.  . promethazine (PHENERGAN) 25 MG tablet Take 1 tablet (25 mg total) by mouth every 8 (eight) hours as needed for nausea or vomiting.  . propranolol ER (INDERAL LA) 80 MG 24 hr capsule Take 1 capsule (80 mg total)  by mouth daily.  . Pyridoxine HCl (VITAMIN B-6 PO) Take 1 tablet by mouth daily. Unknown OTC strength  . simvastatin (ZOCOR) 40 MG tablet Take 1 tablet (40 mg total) by mouth at bedtime.  Marland Kitchen Spacer/Aero-Holding Chambers (AEROCHAMBER MV) inhaler Use as instructed  . Tiotropium Bromide Monohydrate (SPIRIVA RESPIMAT) 2.5 MCG/ACT AERS Inhale 2 puffs into the lungs daily.  . traZODone (DESYREL) 50 MG tablet Take 50 mg by mouth at bedtime as needed for sleep. Reported on 12/26/2015  . verapamil (CALAN) 40 MG tablet Take 40 mg by mouth daily. For palpitations/arrhythmias  . vitamin C (ASCORBIC ACID) 500 MG tablet Take 1,000 mg by mouth daily.  Marland Kitchen zolmitriptan (ZOMIG) 5 MG tablet Take 1 tablet (5 mg total) by mouth as needed. For migraine   No facility-administered encounter medications on file as of 08/03/2016.      Allergies:  Amitriptyline; Fluticasone-salmeterol; Penicillins; Ciprofloxacin; Macrobid [nitrofurantoin monohyd macro]; and Sulfamethoxazole-trimethoprim  Review of Systems: Gen:  Denies  fever, sweats. HEENT: Denies blurred vision. Cvc:  No dizziness, chest pain or heaviness Resp:   Denies cough or sputum porduction. Endoc:  No polyuria, polydipsia. Psych: No depression, insomnia. Other:  All other systems were reviewed and found to be negative other than what is mentioned in the HPI.   Physical Examination:   VS: BP 126/72 (BP Location: Left Arm, Cuff Size: Normal)   Pulse 85   Ht 5\' 1"  (1.549 m)   Wt 197 lb (89.4 kg)   SpO2 95%   BMI 37.22 kg/m   General Appearance: No distress  Neuro:without focal findings,  speech normal,  HEENT: PERRLA, EOM intact. Pulmonary: normal breath sounds, No wheezing.   CardiovascularNormal S1,S2.  No m/r/g.   Abdomen: Benign, Soft, non-tender. Renal:  No costovertebral tenderness  GU:  Not performed at this time. Endoc: No evident thyromegaly, no signs of acromegaly. Skin:   warm, no rash. Extremities: normal, no cyanosis,  clubbing.   LABORATORY PANEL:   CBC No results for input(s): WBC, HGB, HCT, PLT in the last 168 hours. ------------------------------------------------------------------------------------------------------------------  Chemistries  No results for input(s): NA, K, CL, CO2, GLUCOSE, BUN, CREATININE, CALCIUM, MG, AST, ALT, ALKPHOS, BILITOT in the last 168 hours.  Invalid input(s): GFRCGP ------------------------------------------------------------------------------------------------------------------  Cardiac Enzymes No results for input(s): TROPONINI in the last 168 hours. ------------------------------------------------------------  RADIOLOGY:   No results found for this or any previous visit. Results for orders placed during the hospital encounter of 11/28/15  DG Chest 2 View   Narrative CLINICAL DATA:  Right-sided chest pain for 3 days no fever  EXAM: CHEST  2 VIEW  COMPARISON:  08/17/2015  FINDINGS: Cardiac silhouette upper normal in size but stable. Vascular pattern normal. Lungs clear. No effusions.  IMPRESSION: No active cardiopulmonary disease.  Electronically Signed   By: Skipper Cliche M.D.   On: 11/28/2015 12:37    ------------------------------------------------------------------------------------------------------------------  Thank  you for allowing [facility] to assist in the care of your patient. Our recommendations are noted above.  Please contact us if we can be of further service.   Marda Stalker, MD.  [facility] Office Number: [phone]  Patricia Pesa, M.D.  Vilinda Boehringer, M.D.  Merton Border, M.D  08/02/2016

## 2016-08-02 NOTE — Assessment & Plan Note (Signed)
Likely MSK in origin given tenderness over musculature Likely related to lifting at job Advise continue exercising Naproxen twice a day when necessary Stop ibuprofen Return precautions discussed

## 2016-08-03 ENCOUNTER — Ambulatory Visit (INDEPENDENT_AMBULATORY_CARE_PROVIDER_SITE_OTHER): Payer: 59 | Admitting: Internal Medicine

## 2016-08-03 ENCOUNTER — Encounter: Payer: Self-pay | Admitting: Internal Medicine

## 2016-08-03 VITALS — BP 126/72 | HR 85 | Ht 61.0 in | Wt 197.0 lb

## 2016-08-03 DIAGNOSIS — G4733 Obstructive sleep apnea (adult) (pediatric): Secondary | ICD-10-CM | POA: Diagnosis not present

## 2016-08-03 NOTE — Patient Instructions (Signed)
--  Continue to use CPAP whenever sleeping.

## 2016-08-08 ENCOUNTER — Telehealth: Payer: Self-pay | Admitting: *Deleted

## 2016-08-08 NOTE — Telephone Encounter (Signed)
-----   Message from Virginia Crews, MD sent at 08/03/2016  2:51 PM EDT ----- Regarding: FMLA forms Could you please call the patient and let her know that the forms were completed and faxed? She can pick up her copy at the front desk. Thanks!  Lavon Paganini

## 2016-08-08 NOTE — Telephone Encounter (Signed)
Patient informed, expressed understanding. 

## 2016-08-20 ENCOUNTER — Encounter: Payer: Self-pay | Admitting: Internal Medicine

## 2016-08-21 ENCOUNTER — Ambulatory Visit (INDEPENDENT_AMBULATORY_CARE_PROVIDER_SITE_OTHER): Payer: 59 | Admitting: Internal Medicine

## 2016-08-21 ENCOUNTER — Encounter: Payer: Self-pay | Admitting: Internal Medicine

## 2016-08-21 ENCOUNTER — Encounter: Payer: Self-pay | Admitting: *Deleted

## 2016-08-21 VITALS — BP 118/86 | HR 77 | Wt 195.0 lb

## 2016-08-21 DIAGNOSIS — J4541 Moderate persistent asthma with (acute) exacerbation: Secondary | ICD-10-CM | POA: Diagnosis not present

## 2016-08-21 DIAGNOSIS — J0101 Acute recurrent maxillary sinusitis: Secondary | ICD-10-CM

## 2016-08-21 MED ORDER — CLARITHROMYCIN 500 MG PO TABS: 500.0000 mg | ORAL_TABLET | Freq: Two times a day (BID) | ORAL | 0 refills | Status: DC

## 2016-08-21 MED FILL — CLARITHROMYCIN 500 MG TAB: 500 | 14 days supply | Qty: 28 | Fill #0

## 2016-08-21 NOTE — Progress Notes (Signed)
MRN# [id number] [name] Aug 18, 1966   CC: Chief Complaint  Patient presents with  . Follow-up    doing well on CPAP; congested, runny nose, cough w/thick yellow to white mucus      Brief History: 50 -year-old female with history of mild to moderate persistent asthma, triggered by rapid weather changes, CAT dander, perfumes.  Events since last clinic visit: Presents today for a follow up of asthma Patient had a follow-up with sleep specialist, currently awaiting CPAP machine download for further recommendations. She states that she got a flu shot beginning of October. 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 States that she's having cough, shortness of breath, and increased pressure in her face.  Medication:    Current Outpatient Prescriptions:  .  albuterol (PROVENTIL HFA;VENTOLIN HFA) 108 (90 BASE) MCG/ACT inhaler, 2 puffs every 3-4 hours as needed for shortness of breath\wheezing\recurrent cough, Disp: 1 Inhaler, Rfl: 6 .  albuterol (PROVENTIL) (2.5 MG/3ML) 0.083% nebulizer solution, Take 3 mLs (2.5 mg total) by nebulization every 4 (four) hours as needed., Disp: 75 mL, Rfl: 6 .  baclofen (LIORESAL) 10 MG tablet, Take 1 tablet (10 mg total) by mouth 3 (three) times daily as needed (headaches)., Disp: 60 each, Rfl: 1 .  BIOTIN PO, Take 2,000 mg by mouth daily., Disp: , Rfl:  .  cetirizine (ZYRTEC) 10 MG tablet, Take 1 tablet (10 mg total) by mouth daily., Disp: 30 tablet, Rfl: 11 .  Cholecalciferol 50000 UNITS TABS, Take 1 tablet by mouth once a week. For 8 weeks, Disp: 8 tablet, Rfl: 0 .  citalopram (CELEXA) 40 MG tablet, Take 40 mg by mouth daily., Disp: , Rfl:  .  esomeprazole (NEXIUM) 40 MG capsule, Take 1 capsule (40 mg total) by mouth daily., Disp: 30 capsule, Rfl: 6 .  estradiol (ESTRACE) 0.1 MG/GM vaginal cream,  Apply 1 gram per vagina every night for 2 weeks, then apply three times a week, Disp: 30 g, Rfl: 12 .  estrogen, conjugated,-medroxyprogesterone (PREMPRO) 0.3-1.5 MG per tablet, Take 1 tablet by mouth daily., Disp: 30 tablet, Rfl: 12 .  fluticasone (FLONASE) 50 MCG/ACT nasal spray, PLACE 2 SPRAYS INTO THE NOSE DAILY., Disp: 16 g, Rfl: 6 .  furosemide (LASIX) 20 MG tablet, Take 1 tablet (20 mg total) by mouth daily., Disp: 30 tablet, Rfl: 3 .  mometasone-formoterol (DULERA) 200-5 MCG/ACT AERO, Inhale 2 puffs into the lungs 2 (two) times daily., Disp: 8.8 g, Rfl: 1 .  montelukast (SINGULAIR) 10 MG tablet, Take 1 tablet (10 mg total) by mouth at bedtime., Disp: 90 tablet, Rfl: 3 .  naproxen (NAPROSYN) 500 MG tablet, Take 1 tablet (500 mg total) by mouth 2 (two) times daily with a meal., Disp: 60 tablet, Rfl: 0 .  oxybutynin (DITROPAN-XL) 10 MG 24 hr tablet, Take 1 tablet (10 mg total) by mouth at bedtime., Disp: 30 tablet, Rfl: 0 .  phenazopyridine (PYRIDIUM) 95 MG tablet, Take 1 tablet (95 mg total) by mouth 3 (three) times daily as needed for pain., Disp: 30 tablet, Rfl: 1 .  promethazine (PHENERGAN) 25 MG tablet, Take 1 tablet (25 mg total) by mouth every 8 (eight) hours as needed for nausea or vomiting., Disp: 20 tablet, Rfl: 1 .  propranolol ER (INDERAL LA) 80 MG 24 hr capsule, Take 1 capsule (80 mg total) by mouth daily., Disp: 30 capsule,  Rfl: 2 .  Pyridoxine HCl (VITAMIN B-6 PO), Take 1 tablet by mouth daily. Unknown OTC strength, Disp: , Rfl:  .  simvastatin (ZOCOR) 40 MG tablet, Take 1 tablet (40 mg total) by mouth at bedtime., Disp: 90 tablet, Rfl: 3 .  Spacer/Aero-Holding Chambers (AEROCHAMBER MV) inhaler, Use as instructed, Disp: 1 each, Rfl: 0 .  Tiotropium Bromide Monohydrate (SPIRIVA RESPIMAT) 2.5 MCG/ACT AERS, Inhale 2 puffs into the lungs daily., Disp: 4 g, Rfl: 3 .  traZODone (DESYREL) 50 MG tablet, Take 50 mg by mouth at bedtime as needed for sleep. Reported on 12/26/2015, Disp: , Rfl:   .  verapamil (CALAN) 40 MG tablet, Take 40 mg by mouth daily. For palpitations/arrhythmias, Disp: , Rfl:  .  vitamin C (ASCORBIC ACID) 500 MG tablet, Take 1,000 mg by mouth daily., Disp: , Rfl:  .  zolmitriptan (ZOMIG) 5 MG tablet, Take 1 tablet (5 mg total) by mouth as needed. For migraine, Disp: 10 tablet, Rfl: 2    Review of Systems: Gen:  Denies  fever, sweats, chills HEENT: Denies blurred vision, double vision, ear pain, eye pain. Admits to sore throat, nasal drainage/congestion Cvc:  No dizziness, chest pain or heaviness Resp:   Admits to: cough, sinus congestion, Intermittent productive cough,  Gi: Denies swallowing difficulty, stomach pain, nausea or vomiting, diarrhea, constipation, bowel incontinence Gu:  Denies bladder incontinence, burning urine Ext:   No Joint pain, stiffness or swelling Skin: No skin rash, easy bruising or bleeding or hives Endoc:  No polyuria, polydipsia , polyphagia or weight change Other:  All other systems negative  Allergies:  Amitriptyline; Fluticasone-salmeterol; Penicillins; Ciprofloxacin; Macrobid [nitrofurantoin monohyd macro]; and Sulfamethoxazole-trimethoprim  Physical Examination:  VS: BP 118/86 (BP Location: Left Arm, Cuff Size: Normal)   Pulse 77   Wt 195 lb (88.5 kg)   SpO2 100%   BMI 36.84 kg/m   General Appearance: No distress  HEENT: PERRLA, no ptosis, no other lesions noticed. Mild Erythema of oropharynx, increased pressure to palpation noted on frontal and maxillary sinuses Pulmonary:shallow BS at bases, mild upper lobes expiratory wheezing.  Cardiovascular:  Normal S1,S2.  No m/r/g.     Abdomen:Exam: Benign, Soft, non-tender, No masses  Skin:   warm, no rashes, no ecchymosis  Extremities: normal, no cyanosis, clubbing, warm with normal capillary refill.      6 minute walk tests 06/02/2015 -Distance 974 feet/297 m, low saturation 96%, highest heart rate 75. Pulmonary function testing FEV1 87% FEV1/FVC 93% ERV 21% DLCO  uncorrected 71% Impression: No significant obstruction, restriction, no diffusion abnormality, severe reduction in ERV could be related to obesity (central obesity), clinical correlation advised.  (The following images and results were reviewed by Dr. Stevenson Clinch on 08/21/2016).  CXR 08/17/15 The heart size and mediastinal contours are within normal limits. Both lungs are clear. The visualized skeletal structures are unremarkable.  IMPRESSION: No active cardiopulmonary disease.    ECHO 06/2015 Study Conclusions  - Left ventricle: The cavity size was normal. Wall thickness was increased in a pattern of mild LVH. Systolic function was vigorous. The estimated ejection fraction was in the range of 65% to 70%. Wall motion was normal; there were no regional wall motion abnormalities. Doppler parameters are consistent with abnormal left ventricular relaxation (grade 1 diastolic dysfunction). - Mitral valve: There was trivial regurgitation. - Right ventricle: The cavity size was normal. Wall thickness was normal. Systolic function was normal. - Inferior vena cava: The vessel was dilated. The respirophasic diameter changes were blunted (< 50%), consistent  with elevated central venous pressure.  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 Her asthma is under better control, since starting Dulera/Spiriva, and control of her chronic sinusitis issues by ENT. Triggers at this time could be weather changes and allergies.  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 Overall, patient with good control moderate persistent asthma, currently using albuterol on average twice per week, this could be improve more.  Today with mild asthma exacerbation, will treat has such due to acute maxillary sinusitis  Plan: - Continue with Spiriva Respimat 2.5 g, dulera, and rescue inhaler - Continue with allergy regimen as prescribed by ENT - Clarithromycin regimen x 2 weeks      Updated Medication List Outpatient Encounter Prescriptions as of 08/21/2016  Medication Sig  . albuterol (PROVENTIL HFA;VENTOLIN HFA) 108 (90 BASE) MCG/ACT inhaler 2 puffs every 3-4 hours as needed for shortness of breath\wheezing\recurrent cough  . albuterol (PROVENTIL) (2.5 MG/3ML) 0.083% nebulizer solution Take 3 mLs (2.5 mg total) by nebulization every 4 (four) hours as needed.  . baclofen (LIORESAL) 10 MG tablet Take 1 tablet (10 mg total) by mouth 3 (three) times daily as needed (headaches).  Marland Kitchen BIOTIN PO Take 2,000 mg by mouth daily.  . cephALEXin (KEFLEX) 500 MG capsule Take 1 capsule (500 mg total) by mouth 4 (four) times daily.  . cetirizine (ZYRTEC) 10 MG tablet Take 1 tablet (10 mg total) by mouth daily.  . Cholecalciferol 50000 UNITS TABS Take 1 tablet by mouth once a week. For 8 weeks  . citalopram (CELEXA) 40 MG tablet Take 40 mg by mouth daily.  . clarithromycin (BIAXIN) 500 MG tablet Take 1 tablet (500 mg total) by mouth 2 (two) times daily.  Marland Kitchen esomeprazole (NEXIUM) 40 MG capsule Take 1 capsule (40 mg total) by mouth daily.  Marland Kitchen estradiol (ESTRACE) 0.1 MG/GM vaginal cream Apply 1 gram per vagina every night for 2 weeks, then apply three times a week  . estrogen, conjugated,-medroxyprogesterone (PREMPRO) 0.3-1.5 MG per tablet Take 1 tablet by mouth daily.  . fluticasone (FLONASE) 50 MCG/ACT nasal spray PLACE 2 SPRAYS INTO THE NOSE DAILY.  . furosemide (LASIX) 20 MG tablet Take 1 tablet (20 mg total) by mouth daily.  . mometasone-formoterol (DULERA) 200-5  MCG/ACT AERO Inhale 2 puffs into the lungs 2 (two) times daily.  . montelukast (SINGULAIR) 10 MG tablet Take 1 tablet (10 mg total) by mouth at bedtime.  . naproxen (NAPROSYN) 500 MG tablet Take 1 tablet (500 mg total) by mouth 2 (two) times daily with a meal.  . oxybutynin (DITROPAN-XL) 10 MG 24 hr tablet Take 1 tablet (10 mg total) by mouth at bedtime.  . phenazopyridine (PYRIDIUM) 95 MG tablet Take 1 tablet (95 mg total) by mouth 3 (three) times daily as needed for pain.  . promethazine (PHENERGAN) 25 MG tablet Take 1 tablet (25 mg total) by mouth every 8 (eight) hours as needed for nausea or vomiting.  . propranolol ER (INDERAL LA) 80 MG 24 hr capsule Take 1 capsule (80 mg total) by mouth daily.  . Pyridoxine HCl (VITAMIN B-6 PO)  Take 1 tablet by mouth daily. Unknown OTC strength  . simvastatin (ZOCOR) 40 MG tablet Take 1 tablet (40 mg total) by mouth at bedtime.  Marland Kitchen Spacer/Aero-Holding Chambers (AEROCHAMBER MV) inhaler Use as instructed  . Tiotropium Bromide Monohydrate (SPIRIVA RESPIMAT) 2.5 MCG/ACT AERS Inhale 2 puffs into the lungs daily.  . traZODone (DESYREL) 50 MG tablet Take 50 mg by mouth at bedtime as needed for sleep. Reported on 12/26/2015  . verapamil (CALAN) 40 MG tablet Take 40 mg by mouth daily. For palpitations/arrhythmias  . vitamin C (ASCORBIC ACID) 500 MG tablet Take 1,000 mg by mouth daily.  Marland Kitchen zolmitriptan (ZOMIG) 5 MG tablet Take 1 tablet (5 mg total) by mouth as needed. For migraine   No facility-administered encounter medications on file as of 08/21/2016.     Orders for this visit: No orders of the defined types were placed in this encounter.   Thank  you for the visitation and for allowing  [facility] to assist in the care of your patient. Our recommendations are noted above.  Please contact us if we can be of further service.  Vilinda Boehringer, MD [facility] Office Number: [phone]

## 2016-08-21 NOTE — Assessment & Plan Note (Signed)
Patient with known history of asthma since 2004 and with recurrent bronchitis infections. Her asthma is under better control, since starting Dulera/Spiriva, and control of her chronic sinusitis issues by ENT. Triggers at this time could be weather changes and allergies.  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 Overall, patient with good control moderate persistent asthma, currently using albuterol on average twice per week, this could be improve more.  Today with mild asthma exacerbation, will treat has such due to acute maxillary sinusitis  Plan: - Continue with Spiriva Respimat 2.5 g, dulera, and rescue inhaler - Continue with allergy regimen as prescribed by ENT - Clarithromycin regimen x 2 weeks

## 2016-08-21 NOTE — Assessment & Plan Note (Signed)
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

## 2016-08-21 NOTE — Patient Instructions (Addendum)
Follow up with Dr. Juanell Fairly in 6 weeks - cont with CPAP at night and with naps - cont with inhalers - Clarithromycin 500mg  - 1 tab PO BID x 14 days.  May use yogurt or probiotic while on this antibiotic - cont with your allergy regimen - Dr. Juanell Fairly will discuss CPAP compliance and setting with you upon follow up with him.

## 2016-08-22 DIAGNOSIS — G4733 Obstructive sleep apnea (adult) (pediatric): Secondary | ICD-10-CM | POA: Diagnosis not present

## 2016-08-29 ENCOUNTER — Encounter: Payer: Self-pay | Admitting: Student

## 2016-08-29 ENCOUNTER — Ambulatory Visit (INDEPENDENT_AMBULATORY_CARE_PROVIDER_SITE_OTHER): Payer: 59 | Admitting: Student

## 2016-08-29 VITALS — BP 158/88 | Ht 61.0 in | Wt 193.0 lb

## 2016-08-29 DIAGNOSIS — M21611 Bunion of right foot: Secondary | ICD-10-CM

## 2016-08-29 DIAGNOSIS — G8929 Other chronic pain: Secondary | ICD-10-CM

## 2016-08-29 DIAGNOSIS — M25562 Pain in left knee: Secondary | ICD-10-CM | POA: Diagnosis not present

## 2016-08-29 DIAGNOSIS — M79671 Pain in right foot: Secondary | ICD-10-CM | POA: Diagnosis not present

## 2016-08-29 MED ORDER — METHYLPREDNISOLONE ACETATE 40 MG/ML IJ SUSP
40.0000 mg | Freq: Once | INTRAMUSCULAR | Status: AC
  Administered 2016-08-29: 40 mg via INTRA_ARTICULAR

## 2016-08-29 MED ORDER — NAPROXEN 500 MG PO TABS: 500.0000 mg | ORAL_TABLET | Freq: Two times a day (BID) | ORAL | 1 refills | Status: DC | PRN

## 2016-08-29 MED FILL — NAPROXEN 500 MG TABLET: 500 | 30 days supply | Qty: 60 | Fill #0

## 2016-08-29 NOTE — Progress Notes (Signed)
[name] - 50 y.o. female MRN [id number]  Date of birth: 1966/06/15  SUBJECTIVE:  Including CC & ROS.  CC: Left knee and right foot pain Presents with left knee pain that is been ongoing for the past 6 months. She reports occasional swelling to it. Reports buckling to it and have trouble getting up from a sitting to standing position. She denies any specific injury to it. This started aching and has become progressively worse. She has never had this x-rayed. She is taking over-the-counter medications for this without relief.  She has problems with stairs. Denies any numbness or tingling distally. She is having problems with a bunion on her right foot. She has had this for years but it is really acting up right now. She feels as though she cannot walk well on it. 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 She is not wearing any arch support in her shoes. She states she had this made previously.   ROS: No unexpected weight loss, fever, chills, swelling, instability, muscle pain, numbness/tingling, redness, otherwise see HPI   PMHx - Updated and reviewed.  Contributory factors include: Chronic diastolic congestive heart failure, anxiety, sleep apnea PSHx - Updated and reviewed.  Contributory factors include:  Negative FHx - Updated and reviewed.  Contributory factors include:  Negative Social Hx - Updated and reviewed. Contributory factors include: Works in the emergency department Medications - reviewed   DATA REVIEWED: None  PHYSICAL EXAM:  VS: BP:(!) 158/88  HR: bpm  TEMP: ( )  RESP:   HT:5\' 1"  (154.9 cm)   WT:193 lb (87.5 kg)  BMI:36.5 PHYSICAL EXAM: Gen: NAD, alert, cooperative with exam, well-appearing HEENT: clear conjunctiva,  CV:  no edema, capillary refill brisk, normal rate Resp: non-labored Skin: no rashes, normal turgor  Neuro: no gross deficits.  Psych:  alert and oriented  Knee: Normal to inspection with no erythema or effusion or  obvious bony abnormalities. Palpation with no warmth, medial joint line tenderness, patellar tenderness, or condyle tenderness. Has diffuse lateral joint tenderness along the joint line and condyle. ROM full in flexion and extension and lower leg rotation. Ligaments with solid consistent endpoints including ACL, PCL, LCL, MCL. Negative Mcmurray's. Non painful patellar compression. Patellar glide without crepitus. Patellar and quadriceps tendons unremarkable. Hamstring and quadriceps strength is normal.   Ankle & Foot: No visible swelling, ecchymosis, erythema, ulcers, calluses, blister, hallux valgus present on right 1st digit Arch: pes cavus on left, fallen arch on right when standing Achilles tendon without nodules or tenderness No swelling of retrocalcaneal bursa Tenderness to palpation first MTP joint, full range of motion of the joint. No tenderness palpation along the other metatarsals. No pain at base of 5th MT; No tenderness over cuboid; No tenderness over N spot or navicular prominence No tenderness on lateral and medial malleolus No sign of peroneal tendon subluxations or tenderness to palpation Full in plantarflexion, dorsiflexion, inversion, and eversion of the foot; flexion and extension of the toes Strength: 5/5 in all directions. Sensation: intact Vascular: intact w/ dorsalis pedis & posterior tibialis pulses 2+ Stable lateral and medial ligaments; Negative Anterior drawer test    ASSESSMENT & PLAN:   Left knee pain Likely arthritic pain by description. We will get x-rays to assess the severity of it. Due to her significant symptoms she received a steroid injection. She'll take naproxen for pain. Recommended using it in small amounts due to her diastolic heart failure. She'll follow-up as  needed for this. If this is not improved may consider further imaging.  Bunion of great toe of right foot Green insoles were given to help with a fallen arch on her right. Aleve as  needed for pain. She is given a Cam Walker to use a work until this calms down to prevent first digit extension.  She'll wean out as tolerated.

## 2016-08-29 NOTE — Assessment & Plan Note (Signed)
Likely arthritic pain by description. We will get x-rays to assess the severity of it. Due to her significant symptoms she received a steroid injection. She'll take naproxen for pain. Recommended using it in small amounts due to her diastolic heart failure. She'll follow-up as needed for this. If this is not improved may consider further imaging.

## 2016-08-29 NOTE — Assessment & Plan Note (Signed)
Green insoles were given to help with a fallen arch on her right. Aleve as needed for pain. She is given a Cam Walker to use a work until this calms down to prevent first digit extension.  She'll wean out as tolerated.

## 2016-09-05 ENCOUNTER — Ambulatory Visit (INDEPENDENT_AMBULATORY_CARE_PROVIDER_SITE_OTHER): Payer: 59 | Admitting: Family Medicine

## 2016-09-05 VITALS — BP 140/96 | HR 72 | Temp 98.0°F | Ht 61.0 in | Wt 198.6 lb

## 2016-09-05 DIAGNOSIS — M25562 Pain in left knee: Secondary | ICD-10-CM

## 2016-09-05 DIAGNOSIS — I10 Essential (primary) hypertension: Secondary | ICD-10-CM

## 2016-09-05 DIAGNOSIS — Z23 Encounter for immunization: Secondary | ICD-10-CM

## 2016-09-05 DIAGNOSIS — J0101 Acute recurrent maxillary sinusitis: Secondary | ICD-10-CM

## 2016-09-05 LAB — BASIC METABOLIC PANEL WITH GFR
BUN: 11 mg/dL (ref 7–25)
CO2: 30 mmol/L (ref 20–31)
Calcium: 9.3 mg/dL (ref 8.6–10.2)
Chloride: 100 mmol/L (ref 98–110)
Creat: 0.82 mg/dL (ref 0.50–1.10)
GFR, Est Non African American: 84 mL/min (ref >=60)
GLUCOSE: 164 mg/dL — AB (ref 65–99)
POTASSIUM: 3.9 mmol/L (ref 3.5–5.3)
Sodium: 137 mmol/L (ref 135–146)

## 2016-09-05 MED ORDER — LOSARTAN POTASSIUM 50 MG PO TABS: 50.0000 mg | ORAL_TABLET | Freq: Every day | ORAL | 1 refills | Status: DC

## 2016-09-05 MED ORDER — TRAMADOL HCL 50 MG PO TABS: 50.0000 mg | ORAL_TABLET | Freq: Three times a day (TID) | ORAL | 0 refills | Status: DC | PRN

## 2016-09-05 MED FILL — LOSARTAN POTASSIUM 50 MG TA: 50 | 30 days supply | Qty: 30 | Fill #0

## 2016-09-05 MED FILL — traMADol HCL 50 MG TABS: 50 | 10 days supply | Qty: 30 | Fill #0

## 2016-09-05 NOTE — Progress Notes (Signed)
   Subjective:   [name] is a 50 y.o. female with a history of chronic migraines, OSA, asthma, HLD here for Knee pain and sinus infection follow-up  Elevated BP - BP intermittently elevated to [id number] systolic over last year - not taking any antihypertensive agents  L knee pain - worse with rain - worse after steroid injection after working all weekend - taking ibuprofen or aleve - icing and using icy hot - getting xrays tomorrow - hurt to sleep last night - trying to sleep with pillow between legs - pain medial and posterior knee - f/u with [facility] later this month  Sinus infection - ongoing off and on for 52yr - better now after taking abx from Dr Stevenson Clinch - using [employer]   Review of Systems:  Per HPI.   Social History: former smoker  Objective:  BP (!) 140/96 (BP Location: Left Arm, Patient Position: Sitting, Cuff Size: Normal)   Pulse 72   Temp 98 F (36.7 C) (Oral)   Ht 5\' 1"  (1.549 m)   Wt 198 lb 9.6 oz (90.1 kg)   SpO2 100%   BMI 37.53 kg/m   Gen:  50 y.o. female in NAD  HEENT: NCAT, MMM, EOMI, PERRL, anicteric sclerae, OP clear, no facial tenderness to palpation CV: RRR, no MRG Resp: Non-labored, CTAB, no wheezes noted Ext: WWP, no edema MSK: L knee: Normal to inspection with no erythema or effusion or obvious bony abnormalities. No warmth, TTP over medial and lateral joint lines, No patellar tenderness or condyle tenderness. ROM limited in flexion. Ligaments with solid consistent endpoints including ACL, PCL, LCL, MCL. Negative Mcmurray's and provocative meniscal tests. Non painful patellar compression. Patellar and quadriceps tendons unremarkable. Hamstring and quadriceps strength is normal.  Neuro: Alert and oriented, speech normal       Chemistry      Component Value Date/Time   NA 136 03/14/2016 0750   K 3.8 03/14/2016 0750   CL 104 03/14/2016 0750   CO2 25 03/14/2016 0750   BUN 14 03/14/2016 0750   CREATININE 0.82 03/14/2016 0750   CREATININE 0.91 12/30/2015 1600      Component Value Date/Time   CALCIUM 9.0 03/14/2016 0750   ALKPHOS 37 (L) 03/14/2016 0750   AST 16 03/14/2016 0750   ALT 15 03/14/2016 0750   BILITOT 0.7 03/14/2016 0750      Lab Results  Component Value Date   WBC 6.5 03/14/2016   HGB 13.4 03/14/2016   HCT 38.6 03/14/2016   MCV 91.9 03/14/2016   PLT 275 03/14/2016   Lab Results  Component Value Date   TSH 1.655 08/16/2015   Lab Results  Component Value Date   HGBA1C 5.9 09/09/2015   Assessment & Plan:     [name] is a 50 y.o. female here for   HTN (hypertension) Meets criteria for diagnosing hypertension Start losartan 50 mg daily Avoid calcium channel blockers given CHF Avoid diuretics given bladder spasms and urinary incontinence Follow-up in 2 weeks BMP today and at follow-up visit  Acute maxillary sinusitis Improved status post antibiotics  Left knee pain Agrees with is likely arthritic pain Advised patient to going get her x-rays She can take naproxen or Tylenol for pain Small amount of tramadol given to use for severe breakthrough pain Follow-up with sports medicine as scheduled     Virginia Crews, MD MPH PGY-3,  [facility] 09/05/2016  4:57 PM

## 2016-09-05 NOTE — Assessment & Plan Note (Signed)
Agrees with is likely arthritic pain Advised patient to going get her x-rays She can take naproxen or Tylenol for pain Small amount of tramadol given to use for severe breakthrough pain Follow-up with sports medicine as scheduled

## 2016-09-05 NOTE — Addendum Note (Signed)
Addended by: Leonia Corona R on: 09/05/2016 05:41 PM   Modules accepted: Orders

## 2016-09-05 NOTE — Patient Instructions (Signed)
Nice to see you again today. Start losartan 50 mg daily for your blood pressure. We are getting some labs today and someone will call you or send you a letter with the results when they're available  Continue her other medications. You can take either prevent or Tylenol for the pain in your knees as needed. When pain is very bad try tramadol.  I will see you back in 2 weeks and we will get more lab work at that time and see how your blood pressure is doing  Take care,  Dr. Jacinto Reap

## 2016-09-05 NOTE — Assessment & Plan Note (Signed)
Meets criteria for diagnosing hypertension Start losartan 50 mg daily Avoid calcium channel blockers given CHF Avoid diuretics given bladder spasms and urinary incontinence Follow-up in 2 weeks BMP today and at follow-up visit

## 2016-09-05 NOTE — Assessment & Plan Note (Signed)
Improved status post antibiotics

## 2016-09-06 ENCOUNTER — Encounter: Payer: Self-pay | Admitting: Family Medicine

## 2016-09-07 ENCOUNTER — Ambulatory Visit (HOSPITAL_COMMUNITY)
Admission: RE | Admit: 2016-09-07 | Discharge: 2016-09-07 | Disposition: A | Discharge status: Home / Self Care | Payer: 59 | Source: Ambulatory Visit | Attending: Student | Admitting: Student

## 2016-09-07 DIAGNOSIS — M25562 Pain in left knee: Secondary | ICD-10-CM | POA: Diagnosis not present

## 2016-09-26 ENCOUNTER — Ambulatory Visit (INDEPENDENT_AMBULATORY_CARE_PROVIDER_SITE_OTHER): Payer: 59 | Admitting: Student

## 2016-09-26 ENCOUNTER — Encounter: Payer: Self-pay | Admitting: Student

## 2016-09-26 VITALS — BP 131/70 | Ht 61.0 in | Wt 193.0 lb

## 2016-09-26 DIAGNOSIS — M25562 Pain in left knee: Secondary | ICD-10-CM

## 2016-09-26 NOTE — Progress Notes (Signed)
  [name] - 50 y.o. female MRN [id number]  Date of birth: 01-03-1966  SUBJECTIVE:  Including CC & ROS.  CC: left knee pain Reports ongoing knee pain that improved after the injection given last month but it recurred a few weeks later. She reports it being stiff and has pain mainly on the medial aspect. She reports swelling at times especially when she is on her feet all day.   ROS: No unexpected weight loss, fever, chills, swelling, instability, muscle pain, numbness/tingling, redness, otherwise see HPI   PMHx - Updated and reviewed.  Contributory factors include: diastolic heart failure PSHx - Updated and reviewed.  Contributory factors include:  Negative FHx - Updated and reviewed.  Contributory factors include:  Negative Social Hx - Updated and reviewed. Contributory factors include: Negative Medications - reviewed   DATA REVIEWED: None  PHYSICAL EXAM:  VS: BP:131/70  HR: bpm  TEMP: ( )  RESP:   HT:5\' 1"  (154.9 cm)   WT:193 lb (87.5 kg)  BMI:36.5 PHYSICAL EXAM: Gen: NAD, alert, cooperative with exam, well-appearing HEENT: clear conjunctiva,  CV:  no edema, capillary refill brisk, normal rate Resp: non-labored Skin: no rashes, normal turgor  Neuro: no gross deficits.  Psych:  alert and oriented  Knee: Normal to inspection with no erythema or effusion or obvious bony abnormalities. Palpation normal with no warmth, patellar tenderness, or condyle tenderness. Positive medial joint line tenderness ROM full in flexion and extension and lower leg rotation. Negative Mcmurray's, Apley's, and Thessalonian tests. Patellar and quadriceps tendons unremarkable. Hamstring and quadriceps strength is normal.     ASSESSMENT & PLAN:   Left knee pain Left knee pain that could be due to medial compartment arthritis versus a meniscus tear. She did respond to the steroid injection but it was a limited response. Because of this and ongoing pain, will get an MRI of her left knee to assess  for meniscus tear. We'll call patient with the results and determine if she will get Visco supplementation to help with her arthritic pain or be referred to orthopedic surgery for her knee arthroscopically. She'll follow-up with this office when necessary.

## 2016-09-26 NOTE — Assessment & Plan Note (Signed)
Left knee pain that could be due to medial compartment arthritis versus a meniscus tear. She did respond to the steroid injection but it was a limited response. Because of this and ongoing pain, will get an MRI of her left knee to assess for meniscus tear. We'll call patient with the results and determine if she will get Visco supplementation to help with her arthritic pain or be referred to orthopedic surgery for her knee arthroscopically. She'll follow-up with this office when necessary.

## 2016-09-27 NOTE — Progress Notes (Signed)
* [facility]     Assessment and Plan:  Obstructive sleep apnea. -It does not appear that she is not getting an adequate amount of total sleep time with her CPAP device. Her OSA is adequately treated with the cpap, she just needs to wear her PAP more often.  -Continue to wear her CPAP during all sleep periods including when she is napping. She also would ideally be able to get more sleep in overnight.   Circadian rhythm/shift work sleep disorder. -Patient works an overnight shift, this may be contributing to her daytime sleepiness. -Discussed strategy to try to maximize her total sleep time.  Asthma --Continue spiriva and dulera.  --Continue nexium for gerd.   Date: 09/27/2016  MRN# [id number] [name] [birth year]   [name] is a 50 y.o. old female seen in follow up for chief complaint of  Chief Complaint  Patient presents with  . Advice Only    doing better on machine since new supplies:      HPI:   The patient is a 50 year old female, who sees Dr. Stevenson Clinch here in the office for COPD/asthma. She is referred here due to check of sleep apnea. She underwent a sleep study on 12/02/15, which showed an AHI of 6, consistent with mild obstructive sleep apnea.  Currently she is sleeping with it most nights but on average for 4 hours 20 min. She is sleepy by 7, goes to bed around 8 pm and puts on her nasal mask, then falls asleep within about 15 min. She does not wake at night. She wakes at 2 am to her alarm to be at work by [employer], she works at [employer] at [facility] [employer] [employer].  Her settings are 5-20; residual AHI of 0.7  On weekends she goes to bed by 4-5 pm, and wakes at 7am. On those days she feels better.   She feels that hear breathing is doing well. She has been having some sinus issues, she had issues with coughing last week. She is using spiriva  Once daily, and dulera 2 puffs twice daily. She uses albuterol rescue about once per week. She notes that she wakes up coughing and her  throat hurts.  She takes nexium once in am. She used to get severe "strangling" if she eats close to bed time which improved when she stopped eating close to bedtime.   They have a dog but not in her bedroom.    Medication:   Outpatient Encounter Prescriptions as of 09/28/2016  Medication Sig  . albuterol (PROVENTIL HFA;VENTOLIN HFA) 108 (90 BASE) MCG/ACT inhaler 2 puffs every 3-4 hours as needed for shortness of breath\wheezing\recurrent cough  . albuterol (PROVENTIL) (2.5 MG/3ML) 0.083% nebulizer solution Take 3 mLs (2.5 mg total) by nebulization every 4 (four) hours as needed.  . baclofen (LIORESAL) 10 MG tablet Take 1 tablet (10 mg total) by mouth 3 (three) times daily as needed (headaches).  Marland Kitchen BIOTIN PO Take 2,000 mg by mouth daily.  . cetirizine (ZYRTEC) 10 MG tablet Take 1 tablet (10 mg total) by mouth daily.  . Cholecalciferol 50000 UNITS TABS Take 1 tablet by mouth once a week. For 8 weeks  . citalopram (CELEXA) 40 MG tablet Take 40 mg by mouth daily.  . clarithromycin (BIAXIN) 500 MG tablet Take 1 tablet (500 mg total) by mouth 2 (two) times daily.  Marland Kitchen esomeprazole (NEXIUM) 40 MG capsule Take 1 capsule (40 mg total) by mouth daily.  Marland Kitchen estradiol (ESTRACE) 0.1 MG/GM vaginal cream  Apply 1 gram per vagina every night for 2 weeks, then apply three times a week  . estrogen, conjugated,-medroxyprogesterone (PREMPRO) 0.3-1.5 MG per tablet Take 1 tablet by mouth daily.  . fluticasone (FLONASE) 50 MCG/ACT nasal spray PLACE 2 SPRAYS INTO THE NOSE DAILY.  . furosemide (LASIX) 20 MG tablet Take 1 tablet (20 mg total) by mouth daily.  Marland Kitchen losartan (COZAAR) 50 MG tablet Take 1 tablet (50 mg total) by mouth daily.  . mometasone-formoterol (DULERA) 200-5 MCG/ACT AERO Inhale 2 puffs into the lungs 2 (two) times daily.  . montelukast (SINGULAIR) 10 MG tablet Take 1 tablet (10 mg total) by mouth at bedtime.  . naproxen (NAPROSYN) 500 MG tablet Take 1 tablet (500 mg total) by mouth 2 (two) times daily with  a meal.  . naproxen (NAPROSYN) 500 MG tablet Take 1 tablet (500 mg total) by mouth 2 (two) times daily as needed.  Marland Kitchen oxybutynin (DITROPAN-XL) 10 MG 24 hr tablet Take 1 tablet (10 mg total) by mouth at bedtime.  . phenazopyridine (PYRIDIUM) 95 MG tablet Take 1 tablet (95 mg total) by mouth 3 (three) times daily as needed for pain.  . promethazine (PHENERGAN) 25 MG tablet Take 1 tablet (25 mg total) by mouth every 8 (eight) hours as needed for nausea or vomiting.  . propranolol ER (INDERAL LA) 80 MG 24 hr capsule Take 1 capsule (80 mg total) by mouth daily.  . Pyridoxine HCl (VITAMIN B-6 PO) Take 1 tablet by mouth daily. Unknown OTC strength  . simvastatin (ZOCOR) 40 MG tablet Take 1 tablet (40 mg total) by mouth at bedtime.  Marland Kitchen Spacer/Aero-Holding Chambers (AEROCHAMBER MV) inhaler Use as instructed  . Tiotropium Bromide Monohydrate (SPIRIVA RESPIMAT) 2.5 MCG/ACT AERS Inhale 2 puffs into the lungs daily.  . traMADol (ULTRAM) 50 MG tablet Take 1 tablet (50 mg total) by mouth every 8 (eight) hours as needed.  . traZODone (DESYREL) 50 MG tablet Take 50 mg by mouth at bedtime as needed for sleep. Reported on 12/26/2015  . verapamil (CALAN) 40 MG tablet Take 40 mg by mouth daily. For palpitations/arrhythmias  . vitamin C (ASCORBIC ACID) 500 MG tablet Take 1,000 mg by mouth daily.  Marland Kitchen zolmitriptan (ZOMIG) 5 MG tablet Take 1 tablet (5 mg total) by mouth as needed. For migraine   No facility-administered encounter medications on file as of 09/28/2016.      Allergies:  Amitriptyline; Fluticasone-salmeterol; Penicillins; Ciprofloxacin; Macrobid [nitrofurantoin monohyd macro]; and Sulfamethoxazole-trimethoprim  Review of Systems: Gen:  Denies  fever, sweats. HEENT: Denies blurred vision. Cvc:  No dizziness, chest pain or heaviness Resp:   Denies cough or sputum porduction. Endoc:  No polyuria, polydipsia. Psych: No depression, insomnia. Other:  All other systems were reviewed and found to be negative  other than what is mentioned in the HPI.   Physical Examination:   VS: There were no vitals taken for this visit.  General Appearance: No distress  Neuro:without focal findings,  speech normal,  HEENT: PERRLA, EOM intact. Pulmonary: normal breath sounds, No wheezing.   CardiovascularNormal S1,S2.  No m/r/g.   Abdomen: Benign, Soft, non-tender. Renal:  No costovertebral tenderness  GU:  Not performed at this time. Endoc: No evident thyromegaly, no signs of acromegaly. Skin:   warm, no rash. Extremities: normal, no cyanosis, clubbing.   LABORATORY PANEL:   CBC No results for input(s): WBC, HGB, HCT, PLT in the last 168 hours. ------------------------------------------------------------------------------------------------------------------  Chemistries  No results for input(s): NA, K, CL, CO2, GLUCOSE, BUN, CREATININE,  CALCIUM, MG, AST, ALT, ALKPHOS, BILITOT in the last 168 hours.  Invalid input(s): GFRCGP ------------------------------------------------------------------------------------------------------------------  Cardiac Enzymes No results for input(s): TROPONINI in the last 168 hours. ------------------------------------------------------------  RADIOLOGY:   No results found for this or any previous visit. Results for orders placed during the hospital encounter of 11/28/15  DG Chest 2 View   Narrative CLINICAL DATA:  Right-sided chest pain for 3 days no fever  EXAM: CHEST  2 VIEW  COMPARISON:  08/17/2015  FINDINGS: Cardiac silhouette upper normal in size but stable. Vascular pattern normal. Lungs clear. No effusions.  IMPRESSION: No active cardiopulmonary disease.   Electronically Signed   By: Skipper Cliche M.D.   On: 11/28/2015 12:37    ------------------------------------------------------------------------------------------------------------------  Thank  you for allowing [facility] to assist in the care of your  patient. Our recommendations are noted above.  Please contact us if we can be of further service.   Marda Stalker, MD.  [facility] Office Number: [phone]  Patricia Pesa, M.D.  Vilinda Boehringer, M.D.  Merton Border, M.D  09/27/2016

## 2016-09-28 ENCOUNTER — Encounter: Payer: Self-pay | Admitting: Internal Medicine

## 2016-09-28 ENCOUNTER — Ambulatory Visit (INDEPENDENT_AMBULATORY_CARE_PROVIDER_SITE_OTHER): Payer: 59 | Admitting: Internal Medicine

## 2016-09-28 VITALS — BP 130/80 | HR 75 | Wt 196.0 lb

## 2016-09-28 DIAGNOSIS — G4733 Obstructive sleep apnea (adult) (pediatric): Secondary | ICD-10-CM

## 2016-09-28 DIAGNOSIS — J45909 Unspecified asthma, uncomplicated: Secondary | ICD-10-CM

## 2016-09-28 NOTE — Patient Instructions (Signed)
--  try to use your cpap every night for the entire night.

## 2016-10-07 ENCOUNTER — Other Ambulatory Visit: Payer: Self-pay

## 2016-11-21 ENCOUNTER — Other Ambulatory Visit: Payer: Self-pay | Admitting: *Deleted

## 2016-11-21 ENCOUNTER — Telehealth: Payer: Self-pay | Admitting: *Deleted

## 2016-11-21 ENCOUNTER — Other Ambulatory Visit: Payer: Self-pay | Admitting: Family Medicine

## 2016-11-21 DIAGNOSIS — I5032 Chronic diastolic (congestive) heart failure: Secondary | ICD-10-CM

## 2016-11-21 DIAGNOSIS — E785 Hyperlipidemia, unspecified: Secondary | ICD-10-CM

## 2016-11-21 MED ORDER — ALBUTEROL SULFATE HFA 108 (90 BASE) MCG/ACT IN AERS: INHALATION_SPRAY | RESPIRATORY_TRACT | 6 refills | Status: DC

## 2016-11-21 MED ORDER — ZOLMITRIPTAN 5 MG PO TABS: 5.0000 mg | ORAL_TABLET | ORAL | 2 refills | Status: DC | PRN

## 2016-11-21 MED FILL — LOSARTAN POTASSIUM 50 MG TA: 50 | 30 days supply | Qty: 30 | Fill #1

## 2016-11-21 MED FILL — ESOMEPRAZOLE MAG DR 40 MG C: 40 | 30 days supply | Qty: 30 | Fill #1

## 2016-11-21 MED FILL — PROMETHAZINE 25 MG TABLET: 25 | 7 days supply | Qty: 20 | Fill #1

## 2016-11-21 MED FILL — SIMVASTATIN 40 MG TABLET: 40 | 90 days supply | Qty: 90 | Fill #0

## 2016-11-21 MED FILL — FUROSEMIDE 20 MG TABLET: 20 | 30 days supply | Qty: 30 | Fill #0

## 2016-11-21 MED FILL — VENTOLIN HFA 90 MCG INHALER: 108 (90 BAS | 16 days supply | Qty: 18 | Fill #0

## 2016-11-22 ENCOUNTER — Encounter: Payer: Self-pay | Admitting: Internal Medicine

## 2016-11-22 ENCOUNTER — Telehealth: Payer: Self-pay | Admitting: *Deleted

## 2016-11-22 ENCOUNTER — Ambulatory Visit (INDEPENDENT_AMBULATORY_CARE_PROVIDER_SITE_OTHER): Payer: 59 | Admitting: Internal Medicine

## 2016-11-22 DIAGNOSIS — B9689 Other specified bacterial agents as the cause of diseases classified elsewhere: Secondary | ICD-10-CM | POA: Diagnosis not present

## 2016-11-22 DIAGNOSIS — J019 Acute sinusitis, unspecified: Secondary | ICD-10-CM | POA: Diagnosis not present

## 2016-11-22 MED ORDER — CETIRIZINE HCL 10 MG PO TABS: 10.0000 mg | ORAL_TABLET | Freq: Every day | ORAL | 11 refills | Status: DC

## 2016-11-22 MED ORDER — DOXYCYCLINE HYCLATE 100 MG PO TBEC: 100.0000 mg | DELAYED_RELEASE_TABLET | Freq: Two times a day (BID) | ORAL | 0 refills | Status: DC

## 2016-11-22 MED ORDER — CLINDAMYCIN HCL 150 MG PO CAPS: 150.0000 mg | ORAL_CAPSULE | Freq: Four times a day (QID) | ORAL | 0 refills | Status: DC

## 2016-11-22 MED ORDER — TIOTROPIUM BROMIDE MONOHYDRATE 2.5 MCG/ACT IN AERS: 2.0000 | INHALATION_SPRAY | Freq: Every day | RESPIRATORY_TRACT | 3 refills | Status: DC

## 2016-11-22 MED FILL — SPIRIVA RESPIMAT INHAL SPRY: 2.5 | 30 days supply | Qty: 4 | Fill #0

## 2016-11-22 MED FILL — ALL DAY ALLERGY 10 MG TAB: 10 | 100 days supply | Qty: 100 | Fill #0

## 2016-11-22 MED FILL — CLINDAMYCIN HCL 150 MG CAPS: 150 | 5 days supply | Qty: 20 | Fill #0

## 2016-11-22 NOTE — Telephone Encounter (Signed)
Prior Authorization received from [facility] for zolmitriptan 5 mg. Formulary and PA form placed in provider box for completion. Derl Barrow, RN

## 2016-11-22 NOTE — Assessment & Plan Note (Signed)
History and exam consistent with acute sinusitis with eustachian tube dysfunction causing ear discomfort. Patient has history of recurrent sinusitis. Was planning to prescribe Doxycyline due to PCN allergy but patient reports this has not worked in the past. Noted that she has been given Clarithromycin several times for ear infections which is discouraged by [employer] due to high rates of resistance. Respiratory flouroquinolone not an option given patient's allergy to Ciprofloxacin. Have prescribed Clindamycin x5 days. Refilled patient's Zyrtec and encouraged continued use of nasal spray. Encouraged patient to follow up with ENT for further management.

## 2016-11-22 NOTE — Patient Instructions (Signed)
It looks like you have a bacterial sinus infection. I have prescribed Doxycycline to treat this. You should take it twice per day for ten days and complete the course of antibiotics even if you start to feel better.   Start taking your Zyrtec again.   I would touch base with ENT about these frequent sinus infections for further management.   Take Care,   Dr. Juleen China

## 2016-11-22 NOTE — Telephone Encounter (Signed)
Received fax from pharmacy regarding patient's script for zolmitriptan.  Per her insurance she will either need step therapy first with an oral trial of sumatriptan or rizatriptan or a prior authorization can be done on this medication.  Please let RN team know if you choose PA so this can be taken care of thanks. Lucy Woolever,CMA

## 2016-11-22 NOTE — Progress Notes (Signed)
   Subjective:    [name] - 51 y.o. female MRN [id number]  Date of birth: [birth year]  HPI  [name] is here for SDA for sinus pressure.  Reports that sinus pressure and congestion started about 10-14 days ago but that it significantly worsened 5 days ago. She had subjective fevers yesterday. Has a dry cough, irritated throat, and ear pain bilaterally. She has a history of recurrent sinus infections and reports she is followed by ENT. 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    -  reports that she quit smoking about 13 years ago. Her smoking use included Cigarettes. She has a 3.75 pack-year smoking history. She has never used smokeless tobacco. - Review of Systems: Per HPI. - Past Medical History: Patient Active Problem List   Diagnosis Date Noted  . Acute bacterial sinusitis 11/22/2016  . Left knee pain 08/29/2016  . Bunion of great toe of right foot 08/29/2016  . Healthcare maintenance 08/01/2016  . Pyelonephritis 03/16/2016  . Abdominal cramping 12/30/2015  . Bladder spasms 12/30/2015  . Pre-diabetes 10/03/2015  . HTN (hypertension) 10/03/2015  . Chronic diastolic heart failure ([place]) 07/21/2015  . Fatigue 07/21/2015  . Urinary urgency 07/06/2015  . Flank pain 07/06/2015  . Acute maxillary sinusitis 06/27/2015  . Hot flashes 10/14/2014  . Vitamin D deficiency 11/25/2013  . Back pain 06/30/2013  . Migraine 06/07/2013  . Knee pain, bilateral 10/09/2012  . Obstructive sleep apnea 09/08/2010  . HLD (hyperlipidemia) 09/20/2008  . Depression with anxiety 12/26/2006  . RHINITIS, ALLERGIC 12/26/2006  . Asthma 12/26/2006   - Medications: reviewed and updated   Objective:   Physical Exam BP (!) 154/85   Pulse 68   Temp 98.2 F (36.8 C) (Oral)   Wt 201 lb (91.2 kg)   BMI 37.98 kg/m  Gen: NAD, alert, cooperative with exam, appears ill  HEENT: TMs normal bilaterally, oropharynx erythematous without exudates, +TTP of maxillary sinuses bilaterally  CV: RRR, good  S1/S2, no murmur, no edema, capillary refill brisk  Resp: CTABL, no wheezes, non-labored    Assessment & Plan:   Acute bacterial sinusitis History and exam consistent with acute sinusitis with eustachian tube dysfunction causing ear discomfort. Patient has history of recurrent sinusitis. Was planning to prescribe Doxycyline due to PCN allergy but patient reports this has not worked in the past. Noted that she has been given Clarithromycin several times for ear infections which is discouraged by [employer] due to high rates of resistance. Respiratory flouroquinolone not an option given patient's allergy to Ciprofloxacin. Have prescribed Clindamycin x5 days. Refilled patient's Zyrtec and encouraged continued use of nasal spray. Encouraged patient to follow up with ENT for further management.     Phill Myron, D.O. 11/22/2016, 2:32 PM PGY-2, [place]

## 2016-11-26 MED ORDER — SUMATRIPTAN SUCCINATE 50 MG PO TABS: 50.0000 mg | ORAL_TABLET | Freq: Every day | ORAL | 2 refills | Status: DC | PRN

## 2016-11-26 MED FILL — SUMATRIPTAN SUCC 50 MG TAB: 50 | 30 days supply | Qty: 18 | Fill #0

## 2016-11-26 NOTE — Telephone Encounter (Signed)
Dr. Brita Romp, please try to call pt again. She should be home all day today. Ottis Stain, CMA

## 2016-11-26 NOTE — Telephone Encounter (Signed)
Spoke with patient. She has never tried another triptan medication other than the Zomig. She is going to try sumatriptan. Prior authorization will not be completed. New prescription for sumatriptan will be sent to pharmacy for trial.  She also reports that she has been having sinus pain and ear pain and that she is going to call and get an appointment with her ENT.  Virginia Crews, MD, MPH PGY-3,  [facility] 11/26/2016 1:29 PM

## 2016-11-26 NOTE — Telephone Encounter (Signed)
Called patient to discuss previous treatment.  No other triptans previously ordered.  Left VM for patient to call back to clinic.  Will fill out PA after talking to patient about previous treatments.  Virginia Crews, MD, MPH PGY-3,  [facility] 11/26/2016 11:30 AM

## 2016-12-04 ENCOUNTER — Telehealth: Payer: Self-pay | Admitting: *Deleted

## 2016-12-04 NOTE — Telephone Encounter (Signed)
Received fax from [facility] stating patient's insurance will not cover Doxycycline 100 mg until a trail of Doxycycline Hyclate 1 tab BID has been tried.  Please advise.  Derl Barrow, RN

## 2016-12-04 NOTE — Telephone Encounter (Signed)
No record in chart of patient being prescribed doxycycline.  She was prescribed Clindamycin recently though.  Can we clarify who prescribed it with pharmacy?  Virginia Crews, MD, MPH PGY-3,  [facility] 12/04/2016 5:00 PM

## 2016-12-05 NOTE — Telephone Encounter (Signed)
Patient was seen by Dr. Juleen China on 11/22/16.  Doxycycline was discussed with patient, however there is no prescription only for clindamycin.  Derl Barrow, RN

## 2016-12-05 NOTE — Telephone Encounter (Signed)
Ok, I will let the pharmacy know.  Derl Barrow, RN

## 2016-12-05 NOTE — Telephone Encounter (Signed)
I had planned to prescribe Doxycyline (and appears I actually sent it to the pharmacy) but patient then refused that medication saying it didn't work for her. I prescribed Clindamycin instead. Doxycyline does not need to be filled.   Phill Myron, D.O. 12/05/2016, 10:20 AM PGY-2, [place]

## 2016-12-14 DIAGNOSIS — H5213 Myopia, bilateral: Secondary | ICD-10-CM | POA: Diagnosis not present

## 2016-12-27 ENCOUNTER — Ambulatory Visit: Payer: Self-pay | Admitting: Family Medicine

## 2016-12-27 ENCOUNTER — Ambulatory Visit (INDEPENDENT_AMBULATORY_CARE_PROVIDER_SITE_OTHER): Payer: 59 | Admitting: Family Medicine

## 2016-12-27 ENCOUNTER — Encounter (INDEPENDENT_AMBULATORY_CARE_PROVIDER_SITE_OTHER): Payer: 59 | Admitting: Licensed Clinical Social Worker

## 2016-12-27 DIAGNOSIS — F418 Other specified anxiety disorders: Secondary | ICD-10-CM

## 2016-12-27 MED ORDER — ESCITALOPRAM OXALATE 10 MG PO TABS: 10.0000 mg | ORAL_TABLET | Freq: Every day | ORAL | 11 refills | Status: DC

## 2016-12-27 MED FILL — ESCITALOPRAM 10 MG TABLET: 10 | 30 days supply | Qty: 30 | Fill #0

## 2016-12-27 NOTE — Patient Instructions (Signed)
We will start lexapro today.  Come back in 2 weeks.  Take care,  Dr Jerline Pain

## 2016-12-27 NOTE — Progress Notes (Addendum)
[name] is a 51 y.o. female  Dr. Jerline Pain requested [facility] for the following:  anxiety and depression. Discussed services offered by [facility], verbal consent received.  Pt. reports the following symptoms/concerns anxiety, irritability and sleep disturbance.: racing thoughts difficult concentrating     Duration of current symptoms/ problem: past 6 months Age of onset :on and off for many years, Pt has hx of depression and anxiety  Impact on function: not wanting to do the things she enjoyed doing Previous out/inpatient treatment: last therapy about 4 years ago How often do you drink alcohol or beer: denies [place] of alcohol, recreation drugs or SI/HI. 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  GOALS ADDRESSED: Managing stressors  ASSESSMENT/ RECOMMENDATION :  Pt currently experiencing reoccurrence of depression and anxiety. Symptoms exacerbated by psychosocial stressors of recently relationship break up and extended family stressors associated with grandmother..  Pt may benefit from and is in agreement to receive further assessment and therapeutic interventions from [facility] to assist with managing her symptoms and stressors.  Patient is not open to an outside referral at this time.  Behavioral Health Intervention:Community Resource offered/Patient declined;Supportive Counseling; Reflective listening, Relaxed breathing.   PLAN: 1. Patient  will consider F/U with LCSW : No time frame given. 2. LCSW will F/U with patient via phone call in one week 3. Behavioral Health meds: will take 10MG  of Lexapro as prescribed by MD today  4. Patient will work on the following behavioral recommendations: relaxed breathing  Warm Hand Off Completed.     Casimer Lanius, LCSW Licensed Clinical Social Worker [place]   [phone] 10:00 AM

## 2016-12-27 NOTE — Progress Notes (Signed)
   Subjective:  [name] is a 51 y.o. female who presents to the [facility] today with a chief complaint of depression and anxiety.   HPI:  Depression / Anxiety Patient with a history of anxiety and depression. She has been on several medications in the past including celexa, amitriptyline and paxil, however did not like any of these medications because they made her feel like a zombie. She has noticed worsening symptoms over the past few weeks which she relates to her grandmother being in the process of dying.  Also reports symptoms including difficulty falling asleep, increased irritability, difficulty concentrating, and low mood. No SI or HI. Normal appetite.   ROS: Per HPI  PMH: Smoking history reviewed.    Objective:  Physical Exam: BP 134/78   Pulse 82   Temp 98.1 F (36.7 C) (Oral)   Wt 200 lb (90.7 kg)   SpO2 98%   BMI 37.79 kg/m   Gen: NAD, resting comfortably CV: RRR with no murmurs appreciated Pulm: NWOB, CTAB with no crackles, wheezes, or rhonchi MSK: no edema, cyanosis, or clubbing noted Skin: warm, dry Neuro: grossly normal, moves all extremities Psych: blunted affect. Normal thought content. No SI or HI.   Assessment/Plan:  Depression with anxiety Patient not currently on any therapy. She met with [facility] today (see separate note). No SI, HI, or other red flag findings. Will start lexapro '10mg'$  today. Follow up in 2 weeks. If tolerating without side effects, will increase dose.   [name]. Jerline Pain, [place] Resident PGY-3 12/27/2016 5:07 PM

## 2016-12-27 NOTE — Assessment & Plan Note (Signed)
Patient not currently on any therapy. She met with [facility] today (see separate note). No SI, HI, or other red flag findings. Will start lexapro 58m today. Follow up in 2 weeks. If tolerating without side effects, will increase dose.

## 2017-01-04 ENCOUNTER — Telehealth: Payer: Self-pay | Admitting: Licensed Clinical Social Worker

## 2017-01-04 NOTE — Progress Notes (Signed)
Follow up call to patient.   Patient would like to schedule a follow up appointment with LCSW.  She will call the office and schedule an appointment for next week.   Casimer Lanius, LCSW Licensed Clinical Social Worker [facility]   [phone] 3:23 PM

## 2017-01-11 ENCOUNTER — Encounter (HOSPITAL_COMMUNITY): Payer: Self-pay

## 2017-01-11 ENCOUNTER — Emergency Department (HOSPITAL_COMMUNITY)
Admission: EM | Admit: 2017-01-11 | Discharge: 2017-01-11 | Disposition: A | Discharge status: Home / Self Care | Payer: 59 | Attending: Emergency Medicine | Admitting: Emergency Medicine

## 2017-01-11 DIAGNOSIS — Z87891 Personal history of nicotine dependence: Secondary | ICD-10-CM | POA: Diagnosis not present

## 2017-01-11 DIAGNOSIS — Z79899 Other long term (current) drug therapy: Secondary | ICD-10-CM | POA: Diagnosis not present

## 2017-01-11 DIAGNOSIS — J45909 Unspecified asthma, uncomplicated: Secondary | ICD-10-CM | POA: Insufficient documentation

## 2017-01-11 DIAGNOSIS — I10 Essential (primary) hypertension: Secondary | ICD-10-CM | POA: Diagnosis not present

## 2017-01-11 DIAGNOSIS — G43809 Other migraine, not intractable, without status migrainosus: Secondary | ICD-10-CM | POA: Insufficient documentation

## 2017-01-11 DIAGNOSIS — R51 Headache: Secondary | ICD-10-CM | POA: Diagnosis present

## 2017-01-11 MED ORDER — DIPHENHYDRAMINE HCL 50 MG/ML IJ SOLN
12.5000 mg | Freq: Once | INTRAMUSCULAR | Status: AC
Start: 2017-01-11 — End: 2017-01-11
  Administered 2017-01-11: 12.5 mg via INTRAVENOUS
  Filled 2017-01-11: qty 1

## 2017-01-11 MED ORDER — ONDANSETRON 4 MG PO TBDP
4.0000 mg | ORAL_TABLET | Freq: Once | ORAL | Status: AC
  Administered 2017-01-11: 4 mg via ORAL

## 2017-01-11 MED ORDER — ONDANSETRON 4 MG PO TBDP
ORAL_TABLET | ORAL | Status: AC
  Filled 2017-01-11: qty 1

## 2017-01-11 MED ORDER — KETOROLAC TROMETHAMINE 30 MG/ML IJ SOLN
30.0000 mg | Freq: Once | INTRAMUSCULAR | Status: AC
Start: 2017-01-11 — End: 2017-01-11
  Administered 2017-01-11: 30 mg via INTRAVENOUS
  Filled 2017-01-11: qty 1

## 2017-01-11 MED ORDER — BUTALBITAL-APAP-CAFFEINE 50-325-40 MG PO TABS: 1.0000 | ORAL_TABLET | Freq: Four times a day (QID) | ORAL | 0 refills | Status: DC | PRN

## 2017-01-11 MED ORDER — PROCHLORPERAZINE EDISYLATE 5 MG/ML IJ SOLN
10.0000 mg | Freq: Once | INTRAMUSCULAR | Status: AC
  Administered 2017-01-11: 10 mg via INTRAVENOUS
  Filled 2017-01-11: qty 2

## 2017-01-11 MED ORDER — SODIUM CHLORIDE 0.9 % IV BOLUS (SEPSIS)
1000.0000 mL | Freq: Once | INTRAVENOUS | Status: AC
  Administered 2017-01-11: 1000 mL via INTRAVENOUS

## 2017-01-11 MED FILL — BUTALBITAL/APAP/CAFFEINE TB: 50-325-40 | 3 days supply | Qty: 20 | Fill #0

## 2017-01-11 NOTE — Discharge Instructions (Signed)
Feel better [name]

## 2017-01-11 NOTE — ED Provider Notes (Signed)
[facility] DEPT Provider Note   CSN: [id number] Arrival date & time: 01/11/17  0142  By signing my name below, I, [name], attest that this documentation has been prepared under the direction and in the presence of [employer], PA-C.  Electronically Signed: Julien Kelly, ED Scribe. 01/11/17. 4:28 AM.   History   Chief Complaint Chief Complaint  Patient presents with  . Migraine    The history is provided by the patient. No language interpreter was used.   HPI Comments: [name] is a 51 y.o. female who has a PMhx of migraines presents to the Emergency Department complaining of a persistent, waxing and waning, generalized headache x 3 days. Pt says her headache will start at the base of her neck and radiate to the frontal part of her head. Pt expresses that her current headache feels very similar to prior episodes but notes the duration is much longer. She has associated photophobia, nausea and dizziness that she describes as off-balance. Pt says that she began to be diaphoretic and dizzy this evening after she woke up from a nap and then again while at work. Pt expresses that she has been under a lot of family stress lately. She has been having difficulty sleeping and eating due to her environmental situation. She was seen by her PCP, where she was given medication for anxiety and depressive-like symptoms. Pt says she was taken off of Zoline and placed on a new, unknown medication, which she takes prn. She is also propanalol for prevention of her headaches. Pt notes taking advil for migraines last night with moderate relief. She denies numbness, weakness, fever or chills.    Past Medical History:  Diagnosis Date  . Anemia   . Anxiety   . Asthma   . Depression   . Dysrhythmia    Irregular heat w/ asthma episodes and anxiety  . GERD (gastroesophageal reflux disease)   . Migraines   . PONV (postoperative nausea and vomiting)   . Sleep apnea    uses CPAP  . Urinary  incontinence    frequent urination- tx w/ vesicare    Patient Active Problem List   Diagnosis Date Noted  . Left knee pain 08/29/2016  . Bunion of great toe of right foot 08/29/2016  . Healthcare maintenance 08/01/2016  . Abdominal cramping 12/30/2015  . Bladder spasms 12/30/2015  . Pre-diabetes 10/03/2015  . HTN (hypertension) 10/03/2015  . Chronic diastolic heart failure ([place]) 07/21/2015  . Fatigue 07/21/2015  . Urinary urgency 07/06/2015  . Flank pain 07/06/2015  . Hot flashes 10/14/2014  . Vitamin D deficiency 11/25/2013  . Back pain 06/30/2013  . Migraine 06/07/2013  . Knee pain, bilateral 10/09/2012  . Obstructive sleep apnea 09/08/2010  . HLD (hyperlipidemia) 09/20/2008  . Depression with anxiety 12/26/2006  . RHINITIS, ALLERGIC 12/26/2006  . Asthma 12/26/2006    Past Surgical History:  Procedure Laterality Date  . ABLATION    . CESAREAN SECTION     x3  . DILATION AND CURETTAGE OF UTERUS  05/31/2005   MAB, TAB x 2  . HERNIA REPAIR  1990's  . HYSTEROSCOPY W/ ENDOMETRIAL ABLATION    . LEEP    . MAB     x 4  . MOUTH SURGERY     teeth extraction  . TUBAL LIGATION  11/01/2006  . WISDOM TOOTH EXTRACTION      OB History    Gravida Para Term Preterm AB Living   15 8 8  0 7 4  SAB TAB Ectopic Multiple Live Births   2 1             Home Medications    Prior to Admission medications   Medication Sig Start Date End Date Taking? Authorizing Provider  albuterol (PROVENTIL HFA;VENTOLIN HFA) 108 (90 Base) MCG/ACT inhaler 2 puffs every 3-4 hours as needed for shortness of breath\wheezing\recurrent cough 11/21/16   Laverle Hobby, MD  albuterol (PROVENTIL) (2.5 MG/3ML) 0.083% nebulizer solution Take 3 mLs (2.5 mg total) by nebulization every 4 (four) hours as needed. 12/31/14   Virginia Crews, MD  baclofen (LIORESAL) 10 MG tablet Take 1 tablet (10 mg total) by mouth 3 (three) times daily as needed (headaches). 02/17/16   Olin Hauser, DO  BIOTIN PO  Take 2,000 mg by mouth daily.    Historical Provider, MD  cetirizine (ZYRTEC) 10 MG tablet Take 1 tablet (10 mg total) by mouth daily. 11/22/16   Nicolette Bang, DO  Cholecalciferol 50000 UNITS TABS Take 1 tablet by mouth once a week. For 8 weeks 08/19/15   Leeanne Rio, MD  clarithromycin (BIAXIN) 500 MG tablet Take 1 tablet (500 mg total) by mouth 2 (two) times daily. 08/21/16   Vishal Mungal, MD  clindamycin (CLEOCIN) 150 MG capsule Take 1 capsule (150 mg total) by mouth 4 (four) times daily. 11/22/16   Nicolette Bang, DO  escitalopram (LEXAPRO) 10 MG tablet Take 1 tablet (10 mg total) by mouth daily. 12/27/16   Vivi Barrack, MD  esomeprazole (NEXIUM) 40 MG capsule Take 1 capsule (40 mg total) by mouth daily. 05/28/16   Hillary Corinda Gubler, MD  estradiol (ESTRACE) 0.1 MG/GM vaginal cream Apply 1 gram per vagina every night for 2 weeks, then apply three times a week 01/14/15   Osborne Oman, MD  estrogen, conjugated,-medroxyprogesterone (PREMPRO) 0.3-1.5 MG per tablet Take 1 tablet by mouth daily. 01/19/15   Sallyanne Havers Anyanwu, MD  fluticasone (FLONASE) 50 MCG/ACT nasal spray PLACE 2 SPRAYS INTO THE NOSE DAILY. 04/15/14   Renee A Kuneff, DO  furosemide (LASIX) 20 MG tablet TAKE 1 TABLET BY MOUTH DAILY. 11/21/16   Virginia Crews, MD  losartan (COZAAR) 50 MG tablet Take 1 tablet (50 mg total) by mouth daily. 09/05/16   Virginia Crews, MD  mometasone-formoterol (DULERA) 200-5 MCG/ACT AERO Inhale 2 puffs into the lungs 2 (two) times daily. 02/20/16   Vishal Mungal, MD  montelukast (SINGULAIR) 10 MG tablet Take 1 tablet (10 mg total) by mouth at bedtime. 12/30/15   Virginia Crews, MD  naproxen (NAPROSYN) 500 MG tablet Take 1 tablet (500 mg total) by mouth 2 (two) times daily with a meal. 08/01/16   Virginia Crews, MD  oxybutynin (DITROPAN-XL) 10 MG 24 hr tablet Take 1 tablet (10 mg total) by mouth at bedtime. 12/30/15   Virginia Crews, MD  phenazopyridine  (PYRIDIUM) 95 MG tablet Take 1 tablet (95 mg total) by mouth 3 (three) times daily as needed for pain. 12/30/15   Virginia Crews, MD  promethazine (PHENERGAN) 25 MG tablet Take 1 tablet (25 mg total) by mouth every 8 (eight) hours as needed for nausea or vomiting. 02/17/16   Olin Hauser, DO  propranolol ER (INDERAL LA) 80 MG 24 hr capsule Take 1 capsule (80 mg total) by mouth daily. 12/30/15   Virginia Crews, MD  Pyridoxine HCl (VITAMIN B-6 PO) Take 1 tablet by mouth daily. Unknown OTC strength    Historical Provider, MD  simvastatin (ZOCOR) 40 MG tablet TAKE 1 TABLET BY MOUTH AT BEDTIME. 11/21/16   Virginia Crews, MD  Spacer/Aero-Holding Chambers (AEROCHAMBER MV) inhaler Use as instructed 05/09/15   Vilinda Boehringer, MD  SUMAtriptan (IMITREX) 50 MG tablet Take 1 tablet (50 mg total) by mouth daily as needed for migraine. May repeat in 2 hours if headache persists or recurs. 11/26/16   Virginia Crews, MD  Tiotropium Bromide Monohydrate (SPIRIVA RESPIMAT) 2.5 MCG/ACT AERS Inhale 2 puffs into the lungs daily. 11/22/16   Nicolette Bang, DO  traMADol (ULTRAM) 50 MG tablet Take 1 tablet (50 mg total) by mouth every 8 (eight) hours as needed. 09/05/16   Virginia Crews, MD  verapamil (CALAN) 40 MG tablet Take 40 mg by mouth daily. For palpitations/arrhythmias    Historical Provider, MD  vitamin C (ASCORBIC ACID) 500 MG tablet Take 1,000 mg by mouth daily.    Historical Provider, MD    Family History Family History  Problem Relation Age of Onset  . Diabetes Maternal Grandmother   . Anesthesia problems Neg Hx     Social History Social History  Substance Use Topics  . Smoking status: Former Smoker    Packs/day: 0.25    Years: 15.00    Types: Cigarettes    Quit date: 02/16/2003  . Smokeless tobacco: Never Used  . Alcohol use Yes     Comment: socially     Allergies   Amitriptyline; Fluticasone-salmeterol; Penicillins; Ciprofloxacin; Macrobid [nitrofurantoin  monohyd macro]; and Sulfamethoxazole-trimethoprim   Review of Systems Review of Systems A complete 10 system review of systems was obtained and all systems are negative except as noted in the HPI and PMH.    Physical Exam Updated Vital Signs BP 138/76 (BP Location: Left Arm)   Pulse (!) 53   Temp 98 F (36.7 C) (Oral)   Resp 17   Ht 5\' 1"  (1.549 m)   Wt 90.7 kg   SpO2 98%   BMI 37.79 kg/m   Physical Exam  Constitutional: She is oriented to person, place, and time. She appears well-developed and well-nourished. No distress.  Nontoxic and in NAD  HENT:  Head: Normocephalic and atraumatic.  Mouth/Throat: Oropharynx is clear and moist.  Symmetric rise of the uvula with phonation  Eyes: Conjunctivae and EOM are normal. Pupils are equal, round, and reactive to light. No scleral icterus.  Neck: Normal range of motion.  No nuchal rigidity or meningismus  Cardiovascular: Normal rate, regular rhythm and intact distal pulses.   Pulmonary/Chest: Effort normal. No respiratory distress. She has no wheezes.  Respirations even and unlabored  Musculoskeletal: Normal range of motion.  Neurological: She is alert and oriented to person, place, and time. No cranial nerve deficit. She exhibits normal muscle tone. Coordination normal.  GCS 15. Speech is goal oriented. No cranial nerve deficits appreciated; symmetric eyebrow raise, no facial drooping, tongue midline. Patient has equal grip strength bilaterally with 5/5 strength against resistance in all major muscle groups bilaterally. Sensation to light touch intact. Patient moves extremities without ataxia.  Skin: Skin is warm and dry. No rash noted. She is not diaphoretic. No erythema. No pallor.  Psychiatric: She has a normal mood and affect. Her behavior is normal.  Nursing note and vitals reviewed.    ED Treatments / Results  DIAGNOSTIC STUDIES: Oxygen Saturation is 97% on RA, normal by my interpretation.  COORDINATION OF CARE:  4:23 AM  Discussed treatment plan with pt at bedside and pt agreed to plan.  5:30 AM Patient reassessed; sleeping, in NAD. VSS.  Labs (all labs ordered are listed, but only abnormal results are displayed) Labs Reviewed - No data to display  EKG  EKG Interpretation None       Radiology No results found.  Procedures Procedures (including critical care time)  Medications Ordered in ED Medications  ondansetron (ZOFRAN-ODT) 4 MG disintegrating tablet (not administered)  ondansetron (ZOFRAN-ODT) disintegrating tablet 4 mg (4 mg Oral Given 01/11/17 0154)  sodium chloride 0.9 % bolus 1,000 mL (1,000 mLs Intravenous New Bag/Given 01/11/17 0448)  prochlorperazine (COMPAZINE) injection 10 mg (10 mg Intravenous Given 01/11/17 0448)  diphenhydrAMINE (BENADRYL) injection 12.5 mg (12.5 mg Intravenous Given 01/11/17 0448)  ketorolac (TORADOL) 30 MG/ML injection 30 mg (30 mg Intravenous Given 01/11/17 0448)     Initial Impression / Assessment and Plan / ED Course  I have reviewed the triage vital signs and the nursing notes.  Pertinent labs & imaging results that were available during my care of the patient were reviewed by me and considered in my medical decision making (see chart for details).     51 year old female with known history of migraine headaches presents to the emergency department for a headache which has been persistent for the past 3 days. She has a nonfocal neurologic exam. No history of head injury or trauma. No fever, nuchal rigidity, or meningismus.  Supportive care initiated with Compazine, Benadryl, and Toradol. Patient also given IV fluids. On reassessment, patient was found to be sleeping. Low suspicion for emergent etiology.  Patient signed out to Domenic Moras, PA-C at shift change who will reassess the patient and disposition appropriately. Anticipate discharge when symptoms adequately controlled.   Final Clinical Impressions(s) / ED Diagnoses   Final diagnoses:  Other  migraine without status migrainosus, not intractable    New Prescriptions New Prescriptions   No medications on file    I personally performed the services described in this documentation, which was scribed in my presence. The recorded information has been reviewed and is accurate.       Antonietta Breach, PA-C 01/11/17 [place], DO 01/11/17 0700

## 2017-01-11 NOTE — ED Triage Notes (Signed)
Migraine x 3 with no relief using prescription migraine medication. Pt reports blurry vision, sensitivity to light, nausea. Pt reports pain starts in neck and moves up the back of head.

## 2017-02-05 ENCOUNTER — Ambulatory Visit (INDEPENDENT_AMBULATORY_CARE_PROVIDER_SITE_OTHER): Payer: 59 | Admitting: Licensed Clinical Social Worker

## 2017-02-05 DIAGNOSIS — F418 Other specified anxiety disorders: Secondary | ICD-10-CM

## 2017-02-06 NOTE — Progress Notes (Signed)
Integrated Behavioral Health Initial Visit  MRN: [id number] Name: [name]  Total time: 45 minutes Type of Service: [place] Interpretor:No. Interpretor Name and Language: NA  SUBJECTIVE: [name] is a 51 y.o. female referred by Dr. Jerline Pain for Depression and anxiety. Patient reports the following symptoms/concerns: agitation, trouble sleeping, angry, lack of appetite, feeling down, and crying. Duration of problem: past 7 months; Severity of problem: moderate. Reports this has not impacted her work.  Patient reports she use to be outgoing and liked to have fun.  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 Life Changes: Grandmother in Hospice and recent breakup of 10 yr relationship  GOALS ADDRESSED: Patient will reduce symptoms of: anxiety and depression and increase ability of: coping skills. Specifically patient would like to stop crying, be less offended from others and not allow things to bother her, wants to be happy again and "get it together".   INTERVENTIONS: Mindfulness or Relaxation Training, Behavioral Activation and Brief CBT  Standardized Assessments completed: PHQ 9 Depression screen PHQ 2/9 02/06/2017  Decreased Interest 1  Down, Depressed, Hopeless 2  PHQ - 2 Score 3  Altered sleeping 3  Tired, decreased energy 2  Change in appetite 2  Feeling bad or failure about yourself  0  Trouble concentrating 2  Moving slowly or fidgety/restless 2  Suicidal thoughts 0  PHQ-9 Score 14  Some recent data might be hidden   ASSESSMENT: Patient continues to experience Symptoms of depression. She was to follow up with PCP 2 weeks from last office  visit (12/27/16)and with [facility], however she did not. Patient continues to take Lexapro daily with the exception of missing a day or two and report minimal improvement since taking it.  Patient may benefit from and is in agreement to follow -up with her PCP and continue assessment and interventions with [facility].  PLAN: 1. Schedule f/u with PCP 2. Follow up with LCSW in one week 3. Behavioral recommendations: Self-care action plan , continue diaphragmatic breathing,  4. "From scale of 1-10, how likely are you to follow plan?": 4 but believes she can get to a 6 or [place], LCSW Licensed Clinical Social Worker [place]   [phone] 8:56 AM Maurine Cane, LCSW

## 2017-02-13 ENCOUNTER — Other Ambulatory Visit: Payer: Self-pay | Admitting: Family Medicine

## 2017-02-13 MED FILL — LOSARTAN POTASSIUM 50 MG TA: 50 | 60 days supply | Qty: 60 | Fill #0

## 2017-02-13 MED FILL — DULERA 200 MCG/5 MCG INH: 200-5 | 30 days supply | Qty: 13 | Fill #1

## 2017-02-13 MED FILL — ESOMEPRAZOLE MAG DR 40 MG C: 40 | 30 days supply | Qty: 30 | Fill #0

## 2017-02-13 MED FILL — VENTOLIN HFA 90 MCG INHALER: 108 (90 BAS | 16 days supply | Qty: 18 | Fill #1

## 2017-02-14 ENCOUNTER — Encounter: Payer: Self-pay | Admitting: Family Medicine

## 2017-02-14 ENCOUNTER — Ambulatory Visit (INDEPENDENT_AMBULATORY_CARE_PROVIDER_SITE_OTHER): Payer: 59 | Admitting: Family Medicine

## 2017-02-14 ENCOUNTER — Ambulatory Visit (INDEPENDENT_AMBULATORY_CARE_PROVIDER_SITE_OTHER): Payer: 59 | Admitting: Licensed Clinical Social Worker

## 2017-02-14 VITALS — BP 132/90 | HR 68 | Temp 98.0°F | Ht 61.0 in | Wt 190.0 lb

## 2017-02-14 DIAGNOSIS — R05 Cough: Secondary | ICD-10-CM | POA: Diagnosis not present

## 2017-02-14 DIAGNOSIS — F418 Other specified anxiety disorders: Secondary | ICD-10-CM

## 2017-02-14 DIAGNOSIS — R059 Cough, unspecified: Secondary | ICD-10-CM

## 2017-02-14 MED ORDER — ESCITALOPRAM OXALATE 20 MG PO TABS: 20.0000 mg | ORAL_TABLET | Freq: Every day | ORAL | 3 refills | Status: DC

## 2017-02-14 MED ORDER — DEXTROMETHORPHAN HBR 15 MG/5ML PO SYRP
10.0000 mL | ORAL_SOLUTION | Freq: Four times a day (QID) | ORAL | 0 refills | Status: DC | PRN
Start: 2017-02-14 — End: 2017-02-25

## 2017-02-14 MED FILL — ESCITALOPRAM 20 MG TABLET: 20 | 30 days supply | Qty: 30 | Fill #0

## 2017-02-14 NOTE — Progress Notes (Addendum)
Integrated Behavioral Health Follow Up Visit  MRN: [id number] Name: [name]  Total time: 20 minutes Number of Integrated Behavioral Health Clinician visits: 2/10 Type of Service: [place] Interpretor:No. Interpretor Name and Language: NA  SUBJECTIVE: [name] is a 51 y.o. female referred by Dr. Jerline Pain for Depression/ anxiety. Patient reports the following symptoms: More than half the days: trouble falling/ staying asleep, feeling tired.   Severity of problem: moderate.  Parent reports she is doing better overall.  This is reflected in the decrease in PHQ-9 score below.   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  INTERVENTIONS: Behavioral Activation and Supportive Counseling Standardized Assessments completed: PHQ 9 Depression screen [facility] 2/9 02/15/2017 02/06/2017 09/26/2016  Decreased Interest 1 1 0  Down, Depressed, Hopeless 1 2 0  PHQ - 2 Score 2 3 0  Altered sleeping 2 3 -  Tired, decreased energy 2 2 -  Change in appetite 0 2 -  Feeling bad or failure about yourself  1 0 -  Trouble concentrating 0 2 -  Moving slowly or fidgety/restless 0 2 -  Suicidal thoughts 0 0 -  PHQ-9 Score 7 14 -  Some recent data might be hidden  ASSESSMENT: Patient currently experiencing mild depression based on PHQ9.  Patient reports she is feeling better overall however continues to experience some impairment . She is doing great with behavioral activation. Patient may  benefit from and is in agreement to continue with interventions to assist with managing symptoms of depression.  PLAN: 1. Follow up with LCSW in one week 2. Behavioral recommendations: continue with self-care action plan/behavioral activation and relaxed breathing 3. PCP increase Lexapro from 10 to 20 mg  Casimer Lanius, LCSW Licensed Clinical Social Worker [place]   [phone] 12:47 PM

## 2017-02-14 NOTE — Patient Instructions (Signed)
Nice to see you again today. For your cough, you can try the cough syrup that I prescribed. We will also get a chest x-ray to make sure he did not pneumonia.  I'm glad that the depression is doing better. We will increase her Lexapro to 20 mg daily to see if this can help further.  Come back to see me in 6 weeks.  Take care, Dr. Jacinto Reap
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
## 2017-02-15 ENCOUNTER — Ambulatory Visit
Admission: RE | Admit: 2017-02-15 | Discharge: 2017-02-15 | Disposition: A | Discharge status: Home / Self Care | Payer: 59 | Source: Ambulatory Visit | Attending: Family Medicine | Admitting: Family Medicine

## 2017-02-15 DIAGNOSIS — R0602 Shortness of breath: Secondary | ICD-10-CM | POA: Diagnosis not present

## 2017-02-15 DIAGNOSIS — I517 Cardiomegaly: Secondary | ICD-10-CM | POA: Diagnosis not present

## 2017-02-15 DIAGNOSIS — R059 Cough, unspecified: Secondary | ICD-10-CM

## 2017-02-15 DIAGNOSIS — R05 Cough: Secondary | ICD-10-CM

## 2017-02-15 NOTE — Assessment & Plan Note (Signed)
Doing well with therapy and SSRI She continues to meet with [facility] No SI, HI, other red flag findings We will try increasing Lexapro to 20 mg daily Follow-up in 6 weeks

## 2017-02-15 NOTE — Assessment & Plan Note (Signed)
Likely viral, but patient has underlying lung disease and is at risk for community-acquired pneumonia Given focal lung exam, we will obtain chest x-ray Treat as indicated for pneumonia Tussionex cough syrup for symptom management Patient reports she is also planning for f/u with Pulm

## 2017-02-18 ENCOUNTER — Telehealth: Payer: Self-pay | Admitting: *Deleted

## 2017-02-18 NOTE — Telephone Encounter (Signed)
Pt informed. Deseree Blount, CMA  

## 2017-02-18 NOTE — Telephone Encounter (Signed)
-----   Message from Virginia Crews, MD sent at 02/15/2017  1:38 PM EDT ----- Please let patient know that chest XRay shows no pneumonia.  Virginia Crews, MD, MPH PGY-3,  [facility] 02/15/2017 1:38 PM

## 2017-02-21 ENCOUNTER — Ambulatory Visit: Payer: Self-pay

## 2017-02-25 ENCOUNTER — Ambulatory Visit (INDEPENDENT_AMBULATORY_CARE_PROVIDER_SITE_OTHER): Payer: 59 | Admitting: Family Medicine

## 2017-02-25 DIAGNOSIS — J019 Acute sinusitis, unspecified: Secondary | ICD-10-CM | POA: Insufficient documentation

## 2017-02-25 DIAGNOSIS — J329 Chronic sinusitis, unspecified: Secondary | ICD-10-CM | POA: Insufficient documentation

## 2017-02-25 DIAGNOSIS — J320 Chronic maxillary sinusitis: Secondary | ICD-10-CM | POA: Diagnosis not present

## 2017-02-25 MED ORDER — PREDNISONE 50 MG PO TABS: 50.0000 mg | ORAL_TABLET | Freq: Every day | ORAL | 0 refills | Status: DC

## 2017-02-25 MED ORDER — DOXYCYCLINE HYCLATE 100 MG PO TABS: 100.0000 mg | ORAL_TABLET | Freq: Two times a day (BID) | ORAL | 0 refills | Status: DC

## 2017-02-25 MED ORDER — BENZONATATE 200 MG PO CAPS: 200.0000 mg | ORAL_CAPSULE | Freq: Three times a day (TID) | ORAL | 0 refills | Status: DC | PRN

## 2017-02-25 MED FILL — BENZONATATE 200 MG CAPSULE: 200 | 7 days supply | Qty: 20 | Fill #0

## 2017-02-25 MED FILL — DOXYCYCLINE HYCLATE 100 MG: 100 | 10 days supply | Qty: 20 | Fill #0

## 2017-02-25 MED FILL — predniSONE 50 MG TABS: 50 | 5 days supply | Qty: 5 | Fill #0

## 2017-02-25 NOTE — Assessment & Plan Note (Signed)
Patient is here with signs and symptoms consistent with sinusitis. Patient's facial tenderness and persistent cough with nasal congestion makes this most likely. Due to the duration there is concern for bacterial etiology. - Doxycycline twice a day 10 days - Prednisone 5 days to help with swelling. - Tessalon Perles for cough.

## 2017-02-25 NOTE — Patient Instructions (Addendum)
It was a pleasure seeing you today in our clinic. Today we discussed your cough. Here is the treatment plan we have discussed and agreed upon together:   - I prescribed doxycycline. Take 1 tablet twice a day for the next 10 days. - I prescribed prednisone. Take 1 tablet daily for the next 5 days. - Over the next 5 days take your Nexium twice a day. - I prescribed Tessalon Perles. Take this as needed for cough as directed on the bottle. - Follow-up with your PCP in 2 weeks
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
## 2017-02-27 ENCOUNTER — Telehealth: Payer: Self-pay | Admitting: Family Medicine

## 2017-02-27 NOTE — Telephone Encounter (Signed)
Left patient a voice message stating FMLA forms were faxed to [employer].  Forms were copied for scanning in patient's chart and original copy placed up front for pickup.  Derl Barrow, RN

## 2017-02-27 NOTE — Telephone Encounter (Signed)
Pt called because we faxed her FMLA papers earlier in the week and she would like [place] to make a copy and leave them up front for her to pick up. Please call when ready. jw

## 2017-03-28 ENCOUNTER — Ambulatory Visit: Payer: 59 | Admitting: Family Medicine

## 2017-04-01 ENCOUNTER — Ambulatory Visit: Payer: Self-pay | Admitting: Family Medicine

## 2017-04-04 ENCOUNTER — Ambulatory Visit: Payer: 59 | Admitting: Family Medicine

## 2017-04-04 ENCOUNTER — Encounter: Payer: Self-pay | Admitting: Family Medicine

## 2017-04-04 ENCOUNTER — Encounter: Payer: Self-pay | Admitting: Licensed Clinical Social Worker

## 2017-04-04 ENCOUNTER — Ambulatory Visit (INDEPENDENT_AMBULATORY_CARE_PROVIDER_SITE_OTHER): Payer: 59 | Admitting: Family Medicine

## 2017-04-04 VITALS — BP 130/68 | HR 71 | Temp 97.4°F | Ht 61.0 in | Wt 197.4 lb

## 2017-04-04 DIAGNOSIS — F418 Other specified anxiety disorders: Secondary | ICD-10-CM

## 2017-04-04 NOTE — Patient Instructions (Addendum)
Nice to see you.  Follow-up in 3 months. Continue to follow with Neoma Laming.  Take care, Dr. Jacinto Reap
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
## 2017-04-04 NOTE — Assessment & Plan Note (Signed)
Was previously doing well with therapy and SSRI Slightly exacerbated in the setting of grief reaction after the loss of her grandmother No SI, HI, other red flag findings Continue to meet with [facility] Continue Lexapro 20 mg daily Follow-up in 3 months or sooner as needed

## 2017-04-04 NOTE — Progress Notes (Signed)
Total time:15 minutes Type of Service: [place] Name and Language:NA  SUBJECTIVE: [name] is a 51 y.o. female  Patient was referred by Dr. Brita Romp for:  depression.Patient reports the following symptoms/concerns: *sleep disturbance and irritability.  Reason for Referral.: Continue brief intervention to address symptoms associated with depression and anxiety. Reports minimal impairment in daily functions at work. She does well when she stays busy.  Patient will continue to reduce symptoms of: agitation, depression, insomnia and stress, , and increase ability WH:QPRFFM skills, self-management skills and stress reduction, :Begin healthy grieving over loss of grandmother.  Intervention: Supportive Counseling, Reflective listening, Behavioral Therapy (Relaxed breathing)        ASSESSMENT:Patient currently experiencing depression.  Symptoms exacerbated by the recent death of her grandmother and family stressors.  Patient may benefit from, and is in agreement to receive further assessment and continue brief therapeutic interventions to assist with managing her symptoms .   PLAN: 1. Patient will F/U with LCSW   2. Behavioral recommendations: relaxed breathing and self-care plan. 3.Referral: none at this time  Warm Hand Off Completed.      Casimer Lanius, LCSW Licensed Clinical Social Worker [place]   [phone] 8:42 AM

## 2017-04-08 ENCOUNTER — Telehealth: Payer: Self-pay | Admitting: Family Medicine

## 2017-04-08 NOTE — Telephone Encounter (Signed)
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  Her supervisor is requesting documentation for her tetantus shot.  Her record shows it was given in Nov 2017. Supervisor will not check pts record in [employer]. Please fax documentation to [phone] attn [place] [facility]

## 2017-04-09 NOTE — Telephone Encounter (Signed)
Shot record faxed to number provided. 

## 2017-04-10 ENCOUNTER — Ambulatory Visit (INDEPENDENT_AMBULATORY_CARE_PROVIDER_SITE_OTHER): Payer: 59 | Admitting: Family Medicine

## 2017-04-10 ENCOUNTER — Encounter: Payer: Self-pay | Admitting: Family Medicine

## 2017-04-10 ENCOUNTER — Other Ambulatory Visit: Payer: Self-pay | Admitting: Family Medicine

## 2017-04-10 VITALS — BP 118/74 | HR 62 | Temp 98.3°F | Wt 191.0 lb

## 2017-04-10 DIAGNOSIS — R399 Unspecified symptoms and signs involving the genitourinary system: Secondary | ICD-10-CM | POA: Diagnosis not present

## 2017-04-10 LAB — POCT URINALYSIS DIP (MANUAL ENTRY)
BILIRUBIN UA: NEGATIVE
BILIRUBIN UA: NEGATIVE mg/dL
GLUCOSE UA: NEGATIVE mg/dL
LEUKOCYTES UA: NEGATIVE
Nitrite, UA: NEGATIVE
Protein Ur, POC: NEGATIVE mg/dL
Spec Grav, UA: >=1.03 — AB (ref 1.010–1.025)
Urobilinogen, UA: 0.2 E.U./dL
pH, UA: 5.5 (ref 5.0–8.0)

## 2017-04-10 MED ORDER — CEPHALEXIN 500 MG PO CAPS: 500.0000 mg | ORAL_CAPSULE | Freq: Two times a day (BID) | ORAL | 0 refills | Status: AC

## 2017-04-10 MED ORDER — NAPROXEN 500 MG PO TABS: 500.0000 mg | ORAL_TABLET | Freq: Two times a day (BID) | ORAL | 0 refills | Status: DC

## 2017-04-10 MED ORDER — KETOROLAC TROMETHAMINE 60 MG/2ML IM SOLN
60.0000 mg | Freq: Once | INTRAMUSCULAR | Status: AC
  Administered 2017-04-10: 60 mg via INTRAMUSCULAR

## 2017-04-10 MED FILL — CEPHALEXIN 500 MG CAPSULE: 500 | 7 days supply | Qty: 14 | Fill #0

## 2017-04-10 MED FILL — NAPROXEN 500 MG TABLET: 500 | 30 days supply | Qty: 60 | Fill #0

## 2017-04-10 MED FILL — ALL DAY ALLERGY 10 MG TAB: 10 | 100 days supply | Qty: 100 | Fill #1

## 2017-04-10 MED FILL — ESOMEPRAZOLE MAG DR 40 MG C: 40 | 30 days supply | Qty: 30 | Fill #1

## 2017-04-10 MED FILL — SUMATRIPTAN SUCC 50 MG TAB: 50 | 20 days supply | Qty: 12 | Fill #1

## 2017-04-10 MED FILL — FUROSEMIDE 20 MG TABLET: 20 | 90 days supply | Qty: 90 | Fill #1

## 2017-04-10 MED FILL — ESCITALOPRAM 20 MG TABLET: 20 | 30 days supply | Qty: 30 | Fill #1

## 2017-04-10 MED FILL — SPIRIVA RESPIMAT INHAL SPRY: 2.5 | 30 days supply | Qty: 4 | Fill #1

## 2017-04-10 MED FILL — SIMVASTATIN 40 MG TABLET: 40 | 90 days supply | Qty: 90 | Fill #1

## 2017-04-10 MED FILL — VENTOLIN HFA 90 MCG INHALER: 108 (90 BAS | 16 days supply | Qty: 18 | Fill #2

## 2017-04-10 NOTE — Addendum Note (Signed)
Addended by: [place] Pink on: 04/10/2017 01:38 PM   Modules accepted: Orders

## 2017-04-10 NOTE — Progress Notes (Signed)
    Subjective:  [name] is a 51 y.o. female who presents to the [facility] today with a chief complaint of urinary frequency.    HPI:  Urinary Frequency. Symptoms started about a week ago and have been worsening. She started developing low back pain a few days ago with some associated nausea. Over the past couple of days she has also noticed blood after wiping. Patient is unclear if this source is vaginal or urinary. She does not remember the last time she had a menstrual cycle and will be following up with her gynecologist in a few weeks. No fevers, though does have occasional chills. Symptoms feel very similar to her past UTIs.   ROS: Per HPI  PMH: Smoking history reviewed.   Objective:  Physical Exam: BP 118/74   Pulse 62   Temp 98.3 F (36.8 C) (Oral)   Wt 191 lb (86.6 kg)   SpO2 99%   BMI 36.09 kg/m   Gen: NAD, resting comfortably CV: RRR with no murmurs appreciated Pulm: NWOB, CTAB with no crackles, wheezes, or rhonchi GI: Normal bowel sounds present. Soft, Nontender, Nondistended. MSK: Tender to palpation along right lumbar paraspinal muscles. No CVA tenderness.  Skin: warm, dry Neuro: grossly normal, moves all extremities Psych: Normal affect and thought content  Results for orders placed or performed in visit on 04/10/17 (from the past 72 hour(s))  POCT urinalysis dipstick     Status: Abnormal   Collection Time: 04/10/17 10:00 AM  Result Value Ref Range   Color, UA yellow yellow   Clarity, UA clear clear   Glucose, UA negative negative mg/dL   Bilirubin, UA negative negative   Ketones, POC UA negative negative mg/dL   Spec Grav, UA >=1.030 (A) 1.010 - 1.025   Blood, UA trace-lysed (A) negative   pH, UA 5.5 5.0 - 8.0   Protein Ur, POC negative negative mg/dL   Urobilinogen, UA 0.2 0.2 or 1.0 E.U./dL   Nitrite, UA Negative Negative   Leukocytes, UA Negative Negative   Assessment/Plan:  Urinary Frequency Assessment: Patient's clinic picture is not clear. She  has symptoms of her prior UTIs however her UA is negative. She has had negative UAs in the past with positive cultures however. Unfortunately, patient was not able to give enough urine for both microscopy and culture, and thus only a culture was sent. She does not have any systemic symptoms currently to suggest pyelonephritis, however it is concerning that she did have blood in her urine. Patient does not have a history of kidney stones. Discussed treatment options with patient including obtaining abdominal CT today to rule out a stone, which the patient deferred.   Plan: -Given her prior negative UAs and positive cultures, along with her symptoms, will empirically treat with keflex. Doubt patient has pyelonephritis, however her prior cultures have demonstrated sensitivity to first generation cephalosporins and thus she should be adequately covered in case she does have pyelonephritis.  -Toradol given in office today for pain. Also gave Rx for naproxen. -Discussed strict return precautions with patient today including worsening of pain, nasuea, and vomiting. If symptoms worsen, would consider abdominal CT to rule out stone.   [name]. Jerline Pain, [place] [facility] Resident PGY-3 04/10/2017 12:10 PM

## 2017-04-10 NOTE — Patient Instructions (Signed)
Start the keflex and the naproxen.  Let us know if symptoms worsen or do not improve within a few days.  Take care,  Dr Jerline Pain

## 2017-04-13 LAB — URINE CULTURE

## 2017-04-14 NOTE — Progress Notes (Signed)
* [facility]     Assessment and Plan:  Obstructive sleep apnea. -It does not appear that she is not getting an adequate amount of total sleep time with her CPAP device. Her OSA is adequately treated with the cpap, she just needs to wear her PAP more often.  -Continue to wear her CPAP during all sleep periods including when she is napping. She also would ideally be able to get more sleep in overnight.   Circadian rhythm/shift work sleep disorder. -Patient works an overnight shift, this may be contributing to her daytime sleepiness. -She may need medication to help her stay awake but her OSA has to be maximally treated before doing this.   Excessive daytime sleepiness. -Multifactorial from sedating medication, OSA, shift work disorder.   Asthma --Continue spiriva. --Continue nexium for gerd.   Date: 04/14/2017  MRN# [id number] [name] 1954/09/14   [name] is a 51 y.o. old female seen in follow up for chief complaint of  Chief Complaint  Patient presents with  . Follow-up    SOB at all times: not sleeping well since family death 3 weeks ago;     HPI:   The patient is a 51 year old female. She underwent a sleep study on 12/02/15, which showed an AHI of 6, consistent with mild obstructive sleep apnea. Her settings are 5-20; residual AHI of 0.7. 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 She goes to work by 3 AM.  At last visit it was noted that she was getting inadequate sleep, on average for 4 hours 20 min. She is sleepy by 7, goes to bed around 8 pm and puts on her nasal mask, then falls asleep within about 15 min. She does not wake at night. She wakes at 2 am to her alarm to be at work by [employer], she works at [employer] at [facility]. Since that time she notes that her grand mother passed away so she has not been using the machine for the past few few weeks. She notes that she is more tired and sleepy during the day.  She notes that she was recently started  on lexapro, which makes her sleepy.  She takes phenergan and propranolol for migraines.   In terms of her breathing she still feels winded, her dulera ran out, she has remained on spiriva and albuterol.   **Download data 9/24-10/23/17:23/30 days; greater than 4 hours equals 13 days. 43%. Average usage is 4 hours 20 minutes. Onset 5-20; 95th percentile pressure is 10, residual AHI 0.7.   She feels that hear breathing is doing well. She has been having some sinus issues, she had issues with coughing last week. She is using spiriva  Once daily, and dulera 2 puffs twice daily. She uses albuterol rescue about once per week. She notes that she wakes up coughing and her throat hurts.  She takes nexium once in am. She used to get severe "strangling" if she eats close to bed time which improved when she stopped eating close to bedtime.   They have a dog but not in her bedroom.    Medication:   Outpatient Encounter Prescriptions as of 04/17/2017  Medication Sig  . albuterol (PROVENTIL HFA;VENTOLIN HFA) 108 (90 Base) MCG/ACT inhaler 2 puffs every 3-4 hours as needed for shortness of breath\wheezing\recurrent cough  . albuterol (PROVENTIL) (2.5 MG/3ML) 0.083% nebulizer solution Take 3 mLs (2.5 mg total) by nebulization every 4 (four) hours as needed.  . baclofen (LIORESAL) 10  MG tablet Take 1 tablet (10 mg total) by mouth 3 (three) times daily as needed (headaches).  . benzonatate (TESSALON) 200 MG capsule Take 1 capsule (200 mg total) by mouth 3 (three) times daily as needed for cough.  Marland Kitchen BIOTIN PO Take 2,000 mg by mouth daily.  . butalbital-acetaminophen-caffeine (FIORICET, ESGIC) 50-325-40 MG tablet Take 1-2 tablets by mouth every 6 (six) hours as needed for headache.  . cephALEXin (KEFLEX) 500 MG capsule Take 1 capsule (500 mg total) by mouth 2 (two) times daily. Take for 7 days  . cetirizine (ZYRTEC) 10 MG tablet Take 1 tablet (10 mg total) by mouth daily.  . Cholecalciferol 50000 UNITS TABS Take 1  tablet by mouth once a week. For 8 weeks  . doxycycline (VIBRA-TABS) 100 MG tablet Take 1 tablet (100 mg total) by mouth 2 (two) times daily.  Marland Kitchen escitalopram (LEXAPRO) 20 MG tablet Take 1 tablet (20 mg total) by mouth daily.  Marland Kitchen esomeprazole (NEXIUM) 40 MG capsule Take 1 capsule (40 mg total) by mouth daily.  Marland Kitchen esomeprazole (NEXIUM) 40 MG capsule TAKE 1 CAPSULE BY MOUTH ONCE DAILY  . estradiol (ESTRACE) 0.1 MG/GM vaginal cream Apply 1 gram per vagina every night for 2 weeks, then apply three times a week  . estrogen, conjugated,-medroxyprogesterone (PREMPRO) 0.3-1.5 MG per tablet Take 1 tablet by mouth daily.  . fluticasone (FLONASE) 50 MCG/ACT nasal spray PLACE 2 SPRAYS INTO THE NOSE DAILY.  . furosemide (LASIX) 20 MG tablet TAKE 1 TABLET BY MOUTH DAILY.  Marland Kitchen losartan (COZAAR) 50 MG tablet TAKE 1 TABLET BY MOUTH ONCE DAILY  . mometasone-formoterol (DULERA) 200-5 MCG/ACT AERO Inhale 2 puffs into the lungs 2 (two) times daily.  . montelukast (SINGULAIR) 10 MG tablet Take 1 tablet (10 mg total) by mouth at bedtime.  . naproxen (NAPROSYN) 500 MG tablet Take 1 tablet (500 mg total) by mouth 2 (two) times daily with a meal.  . oxybutynin (DITROPAN-XL) 10 MG 24 hr tablet Take 1 tablet (10 mg total) by mouth at bedtime.  . phenazopyridine (PYRIDIUM) 95 MG tablet Take 1 tablet (95 mg total) by mouth 3 (three) times daily as needed for pain.  . predniSONE (DELTASONE) 50 MG tablet Take 1 tablet (50 mg total) by mouth daily with breakfast.  . promethazine (PHENERGAN) 25 MG tablet Take 1 tablet (25 mg total) by mouth every 8 (eight) hours as needed for nausea or vomiting.  . propranolol ER (INDERAL LA) 80 MG 24 hr capsule Take 1 capsule (80 mg total) by mouth daily.  . Pyridoxine HCl (VITAMIN B-6 PO) Take 1 tablet by mouth daily. Unknown OTC strength  . simvastatin (ZOCOR) 40 MG tablet TAKE 1 TABLET BY MOUTH AT BEDTIME.  Marland Kitchen Spacer/Aero-Holding Chambers (AEROCHAMBER MV) inhaler Use as instructed  . SUMAtriptan  (IMITREX) 50 MG tablet Take 1 tablet (50 mg total) by mouth daily as needed for migraine. May repeat in 2 hours if headache persists or recurs.  . Tiotropium Bromide Monohydrate (SPIRIVA RESPIMAT) 2.5 MCG/ACT AERS Inhale 2 puffs into the lungs daily.  . traMADol (ULTRAM) 50 MG tablet Take 1 tablet (50 mg total) by mouth every 8 (eight) hours as needed.  . verapamil (CALAN) 40 MG tablet Take 40 mg by mouth daily. For palpitations/arrhythmias  . vitamin C (ASCORBIC ACID) 500 MG tablet Take 1,000 mg by mouth daily.   No facility-administered encounter medications on file as of 04/17/2017.      Allergies:  Amitriptyline; Fluticasone-salmeterol; Penicillins; Ciprofloxacin; Macrobid [nitrofurantoin monohyd macro];  and Sulfamethoxazole-trimethoprim  Review of Systems: Gen:  Denies  fever, sweats. HEENT: Denies blurred vision. Cvc:  No dizziness, chest pain or heaviness Resp:   Denies cough or sputum porduction. Endoc:  No polyuria, polydipsia. Psych: No depression, insomnia. Other:  All other systems were reviewed and found to be negative other than what is mentioned in the HPI.   Physical Examination:   VS: BP 138/74 (BP Location: Left Arm, Cuff Size: Normal)   Pulse 61   Ht 5\' 1"  (1.549 m)   Wt 86.6 kg (191 lb)   SpO2 100%   BMI 36.09 kg/m   General Appearance: No distress  Neuro:without focal findings,  speech normal,  HEENT: PERRLA, EOM intact. Pulmonary: normal breath sounds, No wheezing.   CardiovascularNormal S1,S2.  No m/r/g.   Abdomen: Benign, Soft, non-tender. Renal:  No costovertebral tenderness  GU:  Not performed at this time. Endoc: No evident thyromegaly, no signs of acromegaly. Skin:   warm, no rash. Extremities: normal, no cyanosis, clubbing.   LABORATORY PANEL:   CBC No results for input(s): WBC, HGB, HCT, PLT in the last 168 hours. ------------------------------------------------------------------------------------------------------------------  Chemistries   No results for input(s): NA, K, CL, CO2, GLUCOSE, BUN, CREATININE, CALCIUM, MG, AST, ALT, ALKPHOS, BILITOT in the last 168 hours.  Invalid input(s): GFRCGP ------------------------------------------------------------------------------------------------------------------  Cardiac Enzymes No results for input(s): TROPONINI in the last 168 hours. ------------------------------------------------------------  RADIOLOGY:   No results found for this or any previous visit. Results for orders placed during the hospital encounter of 11/28/15  DG Chest 2 View   Narrative CLINICAL DATA:  Right-sided chest pain for 3 days no fever  EXAM: CHEST  2 VIEW  COMPARISON:  08/17/2015  FINDINGS: Cardiac silhouette upper normal in size but stable. Vascular pattern normal. Lungs clear. No effusions.  IMPRESSION: No active cardiopulmonary disease.   Electronically Signed   By: Skipper Cliche M.D.   On: 11/28/2015 12:37    ------------------------------------------------------------------------------------------------------------------  Thank  you for allowing [facility] to assist in the care of your patient. Our recommendations are noted above.  Please contact us if we can be of further service.   Marda Stalker, MD.  [facility] Office Number: [phone]  Patricia Pesa, M.D.  Vilinda Boehringer, M.D.  Merton Border, M.D  04/14/2017

## 2017-04-17 ENCOUNTER — Encounter: Payer: Self-pay | Admitting: Internal Medicine

## 2017-04-17 ENCOUNTER — Ambulatory Visit (INDEPENDENT_AMBULATORY_CARE_PROVIDER_SITE_OTHER): Payer: 59 | Admitting: Internal Medicine

## 2017-04-17 ENCOUNTER — Encounter: Payer: Self-pay | Admitting: Family Medicine

## 2017-04-17 VITALS — BP 138/74 | HR 61 | Ht 61.0 in | Wt 191.0 lb

## 2017-04-17 DIAGNOSIS — J45909 Unspecified asthma, uncomplicated: Secondary | ICD-10-CM

## 2017-04-17 DIAGNOSIS — G4733 Obstructive sleep apnea (adult) (pediatric): Secondary | ICD-10-CM | POA: Diagnosis not present

## 2017-04-17 MED ORDER — MOMETASONE FURO-FORMOTEROL FUM 200-5 MCG/ACT IN AERO: 2.0000 | INHALATION_SPRAY | Freq: Two times a day (BID) | RESPIRATORY_TRACT | 5 refills | Status: DC

## 2017-04-17 NOTE — Patient Instructions (Addendum)
It is very important for you to use your CPAP whenever you are sleeping.   Continue spiriva and albuterol.

## 2017-05-08 ENCOUNTER — Ambulatory Visit (INDEPENDENT_AMBULATORY_CARE_PROVIDER_SITE_OTHER): Payer: 59 | Admitting: Obstetrics & Gynecology

## 2017-05-08 DIAGNOSIS — Z1151 Encounter for screening for human papillomavirus (HPV): Secondary | ICD-10-CM

## 2017-05-08 DIAGNOSIS — N939 Abnormal uterine and vaginal bleeding, unspecified: Secondary | ICD-10-CM

## 2017-05-08 DIAGNOSIS — Z124 Encounter for screening for malignant neoplasm of cervix: Secondary | ICD-10-CM | POA: Diagnosis not present

## 2017-05-08 DIAGNOSIS — Z113 Encounter for screening for infections with a predominantly sexual mode of transmission: Secondary | ICD-10-CM | POA: Diagnosis not present

## 2017-05-08 NOTE — Progress Notes (Signed)
GYNECOLOGY OFFICE VISIT NOTE  History:  51 y.o. [id number] here today for evaluation of one episode of scant vaginal bleeding last month, lasted 2 days.  She is s/p endometrial ablation in 2013.  Had similar spotting episode in 2016, had negative evaluation. She wants repeat evaluation with pap smear, STD testing (recentle separated from long-time boyfriend and is concerned about infidelity) and repeat pelvic scan.  She denies any abnormal vaginal discharge, pelvic pain or other concerns.  Had a recent UTI, and has back pain occasionally.  Past Medical History:  Diagnosis Date  . Anemia   . Anxiety   . Asthma   . Depression   . Dysrhythmia    Irregular heat w/ asthma episodes and anxiety  . GERD (gastroesophageal reflux disease)   . Migraines   . PONV (postoperative nausea and vomiting)   . Sleep apnea    uses CPAP  . Urinary incontinence    frequent urination- tx w/ vesicare    Past Surgical History:  Procedure Laterality Date  . ABLATION    . CESAREAN SECTION     x3  . DILATION AND CURETTAGE OF UTERUS  05/31/2005   MAB, TAB x 2  . HERNIA REPAIR  1990's  . HYSTEROSCOPY W/ ENDOMETRIAL ABLATION    . LEEP    . MAB     x 4  . MOUTH SURGERY     teeth extraction  . TUBAL LIGATION  11/01/2006  . WISDOM TOOTH EXTRACTION      The following portions of the patient's history were reviewed and updated as appropriate: allergies, current medications, past family history, past medical history, past social history, past surgical history and problem list.   Health Maintenance:  Normal pap and negative HRHPV on 01/14/15.  Normal mammogram on 12/13/2015.   Review of Systems:  Pertinent items noted in HPI and remainder of comprehensive ROS otherwise negative.   Objective:  Physical Exam AFVSS CONSTITUTIONAL: Well-developed, well-nourished female in no acute distress.  HENT:  Normocephalic, atraumatic. External right and left ear normal. Oropharynx is clear and moist EYES: Conjunctivae  and EOM are normal. Pupils are equal, round, and reactive to light. No scleral icterus.  NECK: Normal range of motion, supple, no masses SKIN: Skin is warm and dry. No rash noted. Not diaphoretic. No erythema. No pallor. NEUROLOGIC: Alert and oriented to person, place, and time. Normal reflexes, muscle tone coordination. No cranial nerve deficit noted. PSYCHIATRIC: Normal mood and affect. Normal behavior. Normal judgment and thought content. CARDIOVASCULAR: Normal heart rate noted RESPIRATORY: Effort and breath sounds normal, no problems with respiration noted ABDOMEN: Soft, no distention noted.   PELVIC: Normal appearing external genitalia; normal appearing vaginal mucosa and cervix.  No abnormal discharge noted. Pap smear sample obtained. Normal uterine size, no other palpable masses, no uterine or adnexal tenderness. MUSCULOSKELETAL: Normal range of motion. No edema noted.   Assessment & Plan:  1. Abnormal vaginal bleeding 2. Screen for STD (sexually transmitted disease) Will do tests as requested by patient.  She is due for pap smear now, this was done. - Cytology - PAP - Hepatitis B surface antigen - Hepatitis C antibody - HIV antibody - RPR - US Pelvis Complete; Future - US Transvaginal Non-OB; Future Will follow up  All results and manage accordingly.  Routine preventative health maintenance measures emphasized. Please refer to After Visit Summary for other counseling recommendations.   Return if symptoms worsen or fail to improve.   Total face-to-face time with patient: 25 minutes.  Over 50% of encounter was spent on counseling and coordination of care.   Verita Schneiders, MD, [place] [place], [facility] for [employer], [place]

## 2017-05-08 NOTE — Patient Instructions (Signed)
Return to clinic for any scheduled appointments or for any gynecologic concerns as needed.   

## 2017-05-09 LAB — HEPATITIS C ANTIBODY

## 2017-05-09 LAB — HIV ANTIBODY (ROUTINE TESTING W REFLEX): HIV Screen 4th Generation wRfx: NONREACTIVE

## 2017-05-09 LAB — RPR: RPR Ser Ql: NONREACTIVE

## 2017-05-09 LAB — HEPATITIS B SURFACE ANTIGEN: HEP B S AG: NEGATIVE

## 2017-05-13 ENCOUNTER — Telehealth: Payer: Self-pay | Admitting: Lab

## 2017-05-13 LAB — CYTOLOGY - PAP
CHLAMYDIA, DNA PROBE: NEGATIVE
Diagnosis: NEGATIVE
HPV: NOT DETECTED
NEISSERIA GONORRHEA: NEGATIVE
Trichomonas: NEGATIVE

## 2017-05-13 NOTE — Telephone Encounter (Signed)
Called patient to make sure she had her Ultrasound appointment for 7/23 at 3:45pm.

## 2017-05-16 ENCOUNTER — Telehealth: Payer: Self-pay | Admitting: Family Medicine

## 2017-05-16 NOTE — Telephone Encounter (Signed)
Called patient to discuss discrepancy regarding FMLA absence. I received a fax stating that patient had gone outside of frequency and duration allocated by Dr. Brita Romp for FMLA leave. In order to certify that medical leave is still needed and absences are due to her own serious health condition I would need to see patient in clinic and perform an exam. I informed patient of this and she was willing to be see in office for physical. Patient stated she would call clinic in the morning to set up an appointment.  Thank you.

## 2017-05-20 ENCOUNTER — Ambulatory Visit (HOSPITAL_COMMUNITY)
Admission: RE | Admit: 2017-05-20 | Discharge: 2017-05-20 | Disposition: A | Discharge status: Home / Self Care | Payer: 59 | Source: Ambulatory Visit | Attending: Obstetrics & Gynecology | Admitting: Obstetrics & Gynecology

## 2017-05-20 DIAGNOSIS — N852 Hypertrophy of uterus: Secondary | ICD-10-CM | POA: Insufficient documentation

## 2017-05-20 DIAGNOSIS — N939 Abnormal uterine and vaginal bleeding, unspecified: Secondary | ICD-10-CM | POA: Diagnosis not present

## 2017-05-21 ENCOUNTER — Telehealth: Payer: Self-pay

## 2017-05-21 NOTE — Telephone Encounter (Signed)
-----   Message from Osborne Oman, MD sent at 05/21/2017 10:34 AM EDT ----- Ultrasound showed 4 cm fibroid in uterus, slightly bigger than last year when it was about 3 cm.  No other findings.  She can come back to discuss further management if bleeding continues/worsens.  Please call to inform patient of results and recommendations.

## 2017-05-21 NOTE — Telephone Encounter (Signed)
Per  Dr. Harolyn Rutherford, the patiient's US showed 4 cm fibroid in uterus, slightly bigger than last year when it was about 3 cm. No other findings. She can come back to discuss further management if bleeding continues/worsens. Please call to inform patient of results and recommendations. I called the patient to give [place] results. I explained [place] results to patient . Patient stated that she is still having heavy bleeding and she would like to be seen. Patient's appointment is scheduled for 8/8 at 3pm. Patient had no questions and verbalized understanding.

## 2017-05-28 ENCOUNTER — Telehealth: Payer: Self-pay | Admitting: *Deleted

## 2017-05-28 DIAGNOSIS — R102 Pelvic and perineal pain: Secondary | ICD-10-CM

## 2017-05-28 MED ORDER — DICLOFENAC SODIUM 75 MG PO TBEC: 75.0000 mg | DELAYED_RELEASE_TABLET | Freq: Two times a day (BID) | ORAL | 0 refills | Status: DC

## 2017-05-28 MED FILL — DICLOFENAC SOD 75 MG TAB EC: 75 | 30 days supply | Qty: 60 | Fill #0

## 2017-05-28 NOTE — Telephone Encounter (Signed)
[name] called today and left a message she is having a lot of pain in lower stomach and radiating to pelvic area and  has an appointment coming up but wants to know if she can get something to relieve her pain.  I called [name] and she states she had her ultrasound and was told she has fibroids and was having cramps when she saw Dr. Harolyn Rutherford last, but is worse now. States since Saturday havng cramping like she is going to get her period but is not bleeding.  States she took ibuprofen 800 Saturday which helped to subsibe a little and hot packs.  I informed her I can prescribe diclofenac and reviewed she is not allergic to NSAIDs, is not taking naprosyn that is on her med profile, does not have peptic ulcer disease, does not have chronic kidney disease.  I reviewed her appointment with her and to call back or go to [facility]au if pain severe. I instructed her she can not take ibuprofen and diclofenac,.

## 2017-05-29 ENCOUNTER — Encounter (HOSPITAL_COMMUNITY): Payer: Self-pay

## 2017-05-29 ENCOUNTER — Inpatient Hospital Stay (HOSPITAL_COMMUNITY)
Admission: AD | Admit: 2017-05-29 | Discharge: 2017-05-29 | Disposition: A | Discharge status: Home / Self Care | Payer: 59 | Source: Ambulatory Visit | Attending: Family Medicine | Admitting: Family Medicine

## 2017-05-29 ENCOUNTER — Encounter: Payer: Self-pay | Admitting: Family Medicine

## 2017-05-29 ENCOUNTER — Ambulatory Visit (INDEPENDENT_AMBULATORY_CARE_PROVIDER_SITE_OTHER): Payer: 59 | Admitting: Family Medicine

## 2017-05-29 VITALS — BP 110/70 | HR 84 | Temp 98.6°F | Ht 61.0 in | Wt 189.0 lb

## 2017-05-29 DIAGNOSIS — G43701 Chronic migraine without aura, not intractable, with status migrainosus: Secondary | ICD-10-CM

## 2017-05-29 DIAGNOSIS — N939 Abnormal uterine and vaginal bleeding, unspecified: Secondary | ICD-10-CM | POA: Insufficient documentation

## 2017-05-29 DIAGNOSIS — Z87891 Personal history of nicotine dependence: Secondary | ICD-10-CM | POA: Diagnosis not present

## 2017-05-29 DIAGNOSIS — R109 Unspecified abdominal pain: Secondary | ICD-10-CM | POA: Insufficient documentation

## 2017-05-29 LAB — CBC
HEMATOCRIT: 37 % (ref 36.0–46.0)
HEMOGLOBIN: 12.5 g/dL (ref 12.0–15.0)
MCH: 31.4 pg (ref 26.0–34.0)
MCHC: 33.8 g/dL (ref 30.0–36.0)
MCV: 93 fL (ref 78.0–100.0)
Platelets: 258 10*3/uL (ref 150–400)
RBC: 3.98 MIL/uL (ref 3.87–5.11)
RDW: 13 % (ref 11.5–15.5)
WBC: 4.4 10*3/uL (ref 4.0–10.5)

## 2017-05-29 MED ORDER — TRAMADOL HCL 50 MG PO TABS: 50.0000 mg | ORAL_TABLET | Freq: Four times a day (QID) | ORAL | 0 refills | Status: DC | PRN

## 2017-05-29 MED ORDER — PROMETHAZINE HCL 25 MG/ML IJ SOLN
25.0000 mg | Freq: Once | INTRAMUSCULAR | Status: AC
  Administered 2017-05-29: 25 mg via INTRAMUSCULAR

## 2017-05-29 MED ORDER — ONDANSETRON HCL 4 MG PO TABS: 4.0000 mg | ORAL_TABLET | Freq: Three times a day (TID) | ORAL | 0 refills | Status: DC | PRN

## 2017-05-29 MED ORDER — DEXAMETHASONE SODIUM PHOSPHATE 10 MG/ML IJ SOLN
10.0000 mg | Freq: Once | INTRAMUSCULAR | Status: AC
  Administered 2017-05-29: 10 mg via INTRAMUSCULAR

## 2017-05-29 MED ORDER — KETOROLAC TROMETHAMINE 60 MG/2ML IM SOLN
60.0000 mg | Freq: Once | INTRAMUSCULAR | Status: AC
  Administered 2017-05-29: 60 mg via INTRAMUSCULAR
  Filled 2017-05-29: qty 2

## 2017-05-29 MED FILL — traMADol HCL 50 MG TABS: 50 | 3 days supply | Qty: 15 | Fill #0

## 2017-05-29 MED FILL — ONDANSETRON HCL 4 MG TABLET: 4 | 2 days supply | Qty: 6 | Fill #0

## 2017-05-29 NOTE — Patient Instructions (Signed)
It was a pleasure to see you today! Thank you for choosing [facility] for your primary care. [name] was seen for migraine.   Our plans for today were:  The shots should help your migraines.  See Dr. Tammi Klippel to discuss further migraine prevention.    Best,  Dr. Lindell [name]

## 2017-05-29 NOTE — MAU Provider Note (Signed)
History     CSN: [id number]  Arrival date and time: 05/29/17 1017   First Provider Initiated Contact with Patient 05/29/17 0535      Chief Complaint  Patient presents with  . Abdominal Pain  . Vaginal Bleeding   HPI   Ms.[name] is a 51 y.o. female [phone] here in MAU with abdominal pain and vaginal bleeding. She was seen recently in the [facility] by Dr. Harolyn Rutherford for this problem. Recent pelvic US showed a 4 cm fibroid that was there 1 year ago however slightly larger. In 2013 she had an ablation and had spotting off and on however no menstrual cycle. Starting in June 2018 she started having menstrual like bleeding.  She was prescribed Voltaren and has been taking that however states it does not help.   She is scheduled to see Dr. Liam Rogers on 8/8 to discuss management of bleeding and fibroid.  OB History    Gravida Para Term Preterm AB Living   15 8 8  0 7 4   SAB TAB Ectopic Multiple Live Births   2 1            Past Medical History:  Diagnosis Date  . Anemia   . Anxiety   . Asthma   . Depression   . Dysrhythmia    Irregular heat w/ asthma episodes and anxiety  . GERD (gastroesophageal reflux disease)   . Migraines   . PONV (postoperative nausea and vomiting)   . Sleep apnea    uses CPAP  . Urinary incontinence    frequent urination- tx w/ vesicare    Past Surgical History:  Procedure Laterality Date  . ABLATION    . CESAREAN SECTION     x3  . DILATION AND CURETTAGE OF UTERUS  05/31/2005   MAB, TAB x 2  . HERNIA REPAIR  1990's  . HYSTEROSCOPY W/ ENDOMETRIAL ABLATION    . LEEP    . MAB     x 4  . MOUTH SURGERY     teeth extraction  . TUBAL LIGATION  11/01/2006  . WISDOM TOOTH EXTRACTION      Family History  Problem Relation Age of Onset  . Diabetes Maternal Grandmother   . Anesthesia problems Neg Hx     Social History  Substance Use Topics  . Smoking status: Former Smoker    Packs/day: 0.25    Years: 15.00    Types: Cigarettes    Quit date:  02/16/2003  . Smokeless tobacco: Never Used  . Alcohol use Yes     Comment: socially    Allergies:  Allergies  Allergen Reactions  . Amitriptyline     Abnormal behavior. Just doesn't feel like normal self   . Fluticasone-Salmeterol Other (See Comments)    Thrush even with mouth rinsing; worsens cough  . Penicillins Itching and Swelling  . Ciprofloxacin Itching and Rash  . Macrobid [Nitrofurantoin Monohyd Macro] Itching  . Sulfamethoxazole-Trimethoprim Rash    Prescriptions Prior to Admission  Medication Sig Dispense Refill Last Dose  . diclofenac (VOLTAREN) 75 MG EC tablet Take 1 tablet (75 mg total) by mouth 2 (two) times daily with a meal. 60 tablet 0 05/28/2017 at Unknown time  . albuterol (PROVENTIL HFA;VENTOLIN HFA) 108 (90 Base) MCG/ACT inhaler 2 puffs every 3-4 hours as needed for shortness of breath\wheezing\recurrent cough 1 Inhaler 6 Taking  . albuterol (PROVENTIL) (2.5 MG/3ML) 0.083% nebulizer solution Take 3 mLs (2.5 mg total) by nebulization every 4 (four) hours as needed.  75 mL 6 Taking  . baclofen (LIORESAL) 10 MG tablet Take 1 tablet (10 mg total) by mouth 3 (three) times daily as needed (headaches). 60 each 1 Taking  . benzonatate (TESSALON) 200 MG capsule Take 1 capsule (200 mg total) by mouth 3 (three) times daily as needed for cough. 20 capsule 0 Taking  . BIOTIN PO Take 2,000 mg by mouth daily.   Taking  . butalbital-acetaminophen-caffeine (FIORICET, ESGIC) 50-325-40 MG tablet Take 1-2 tablets by mouth every 6 (six) hours as needed for headache. 20 tablet 0 Taking  . cetirizine (ZYRTEC) 10 MG tablet Take 1 tablet (10 mg total) by mouth daily. 30 tablet 11 Taking  . Cholecalciferol 50000 UNITS TABS Take 1 tablet by mouth once a week. For 8 weeks 8 tablet 0 Taking  . doxycycline (VIBRA-TABS) 100 MG tablet Take 1 tablet (100 mg total) by mouth 2 (two) times daily. 20 tablet 0 Taking  . escitalopram (LEXAPRO) 20 MG tablet Take 1 tablet (20 mg total) by mouth daily. 30  tablet 3 Taking  . esomeprazole (NEXIUM) 40 MG capsule Take 1 capsule (40 mg total) by mouth daily. 30 capsule 6 Taking  . esomeprazole (NEXIUM) 40 MG capsule TAKE 1 CAPSULE BY MOUTH ONCE DAILY 30 capsule 6 Taking  . estradiol (ESTRACE) 0.1 MG/GM vaginal cream Apply 1 gram per vagina every night for 2 weeks, then apply three times a week 30 g 12 Taking  . estrogen, conjugated,-medroxyprogesterone (PREMPRO) 0.3-1.5 MG per tablet Take 1 tablet by mouth daily. 30 tablet 12 Taking  . fluticasone (FLONASE) 50 MCG/ACT nasal spray PLACE 2 SPRAYS INTO THE NOSE DAILY. 16 g 6 Taking  . furosemide (LASIX) 20 MG tablet TAKE 1 TABLET BY MOUTH DAILY. 30 tablet 3 Taking  . losartan (COZAAR) 50 MG tablet TAKE 1 TABLET BY MOUTH ONCE DAILY 30 tablet 1 Taking  . mometasone-formoterol (DULERA) 200-5 MCG/ACT AERO Inhale 2 puffs into the lungs 2 (two) times daily. 8.8 g 5   . montelukast (SINGULAIR) 10 MG tablet Take 1 tablet (10 mg total) by mouth at bedtime. 90 tablet 3 Taking  . naproxen (NAPROSYN) 500 MG tablet Take 1 tablet (500 mg total) by mouth 2 (two) times daily with a meal. 60 tablet 0 Taking  . oxybutynin (DITROPAN-XL) 10 MG 24 hr tablet Take 1 tablet (10 mg total) by mouth at bedtime. 30 tablet 0 Taking  . phenazopyridine (PYRIDIUM) 95 MG tablet Take 1 tablet (95 mg total) by mouth 3 (three) times daily as needed for pain. 30 tablet 1 Taking  . predniSONE (DELTASONE) 50 MG tablet Take 1 tablet (50 mg total) by mouth daily with breakfast. 5 tablet 0 Taking  . promethazine (PHENERGAN) 25 MG tablet Take 1 tablet (25 mg total) by mouth every 8 (eight) hours as needed for nausea or vomiting. 20 tablet 1 Taking  . propranolol ER (INDERAL LA) 80 MG 24 hr capsule Take 1 capsule (80 mg total) by mouth daily. 30 capsule 2 Taking  . Pyridoxine HCl (VITAMIN B-6 PO) Take 1 tablet by mouth daily. Unknown OTC strength   Taking  . simvastatin (ZOCOR) 40 MG tablet TAKE 1 TABLET BY MOUTH AT BEDTIME. 90 tablet 3 Taking  .  Spacer/Aero-Holding Chambers (AEROCHAMBER MV) inhaler Use as instructed 1 each 0 Taking  . SUMAtriptan (IMITREX) 50 MG tablet Take 1 tablet (50 mg total) by mouth daily as needed for migraine. May repeat in 2 hours if headache persists or recurs. 10 tablet 2 Taking  .  Tiotropium Bromide Monohydrate (SPIRIVA RESPIMAT) 2.5 MCG/ACT AERS Inhale 2 puffs into the lungs daily. 4 g 3 Taking  . traMADol (ULTRAM) 50 MG tablet Take 1 tablet (50 mg total) by mouth every 8 (eight) hours as needed. 30 tablet 0 Taking  . verapamil (CALAN) 40 MG tablet Take 40 mg by mouth daily. For palpitations/arrhythmias   Taking  . vitamin C (ASCORBIC ACID) 500 MG tablet Take 1,000 mg by mouth daily.   Taking   Results for orders placed or performed during the hospital encounter of 05/29/17 (from the past 48 hour(s))  CBC     Status: None   Collection Time: 05/29/17  5:29 AM  Result Value Ref Range   WBC 4.4 4.0 - 10.5 K/uL   RBC 3.98 3.87 - 5.11 MIL/uL   Hemoglobin 12.5 12.0 - 15.0 g/dL   HCT 37.0 36.0 - 46.0 %   MCV 93.0 78.0 - 100.0 fL   MCH 31.4 26.0 - 34.0 pg   MCHC 33.8 30.0 - 36.0 g/dL   RDW 13.0 11.5 - 15.5 %   Platelets 258 150 - 400 K/uL   Review of Systems  Gastrointestinal: Positive for abdominal pain.  Genitourinary: Positive for pelvic pain.  Neurological: Negative for dizziness.   Physical Exam   Blood pressure (!) 153/85, pulse 64, temperature 97.7 F (36.5 C), temperature source Oral, resp. rate 18, height 5\' 1"  (1.549 m), weight 190 lb (86.2 kg), SpO2 99 %.  Physical Exam  Constitutional: She is oriented to person, place, and time. She appears well-developed and well-nourished. No distress.  HENT:  Head: Normocephalic.  Eyes: Pupils are equal, round, and reactive to light.  Genitourinary:  Genitourinary Comments: Vagina - Small amount of dark red blood noted in the vault, no odor  Cervix - scant-small amount of active bleeding  Bimanual exam: Cervix closed Uterus non tender, normal  size Adnexa non tender, no masses bilaterally Chaperone present for exam.   Musculoskeletal: Normal range of motion.  Neurological: She is alert and oriented to person, place, and time.  Skin: Skin is warm. She is not diaphoretic.  Psychiatric: Her behavior is normal.   MAU Course  Procedures  None  MDM  CBC Hgb stable at this time. Bleeding minimal on exam. Patient without dizziness.  Toradol given 60 mg IM; Pain down to 2/10 from 8/10.   Assessment and Plan   A:  1. Abnormal uterine bleeding   2. Abdominal cramping     P:  Discharge home in stable condition Rx: Ultram Return to MAU if symptoms worsen  Follow up with GYN visit as scheduled  Noni Saupe I, NP 05/30/2017 1:16 PM

## 2017-05-29 NOTE — Progress Notes (Signed)
   CC: migraine  HPI  Started in the back of her neck, now in the top of her head. Started Sunday. Taking excedrin migraine. No triptans because she feels sumitriptan doesn't work for her. Has not been taking propranolol. She notes she was doing better on the propranolol, but it was stopped. She says Dr. Ardelia Mems stopped this. Has had HAs that last a whole week. Attributes this to stress. Any HA she has a funny taste in her mouth. Eyes - blurry vision and pain in the back of her eyes. Toradol helped her headache. Nausea yes. History of going to the headache clinic. Last headache was a long time ago. HA triggered by certain foods and she stays away from this. Been using her CPAP.   CC, SH/smoking status, and VS noted  Objective: BP 110/70   Pulse 84   Temp 98.6 F (37 C) (Oral)   Ht 5\' 1"  (1.549 m)   Wt 189 lb (85.7 kg)   SpO2 99%   BMI 35.71 kg/m  Gen: NAD, alert, cooperative, and pleasant overweight female. HEENT: NCAT, EOMI, PERRL CV: RRR, no murmur Resp: CTAB, no wheezes, non-labored Abd: SNTND, BS present, no guarding or organomegaly Ext: No edema, warm Neuro: Alert and oriented, Speech clear, No gross deficits  Assessment and plan:  Migraine Patient seen at MAU this morning for fibroids and vaginal bleeding, which is very stressful to her. She attributes this migrated to stress. Received Toradol and tramadol from the MAU. This helps some with her headache. Will give Decadron and Phenergan here. Instructed to follow-up with her PCP regarding migraine prophylaxis, would consider propranolol unless there is some contraindication to this, as well as possibly an alternate triptan. No weakness, no red flag symptoms, neuro exam normal.   No orders of the defined types were placed in this encounter.   Meds ordered this encounter  Medications  . dexamethasone (DECADRON) injection 10 mg  . promethazine (PHENERGAN) injection 25 mg    Ralene Ok, MD, PGY2 05/29/2017 3:47 PM

## 2017-05-29 NOTE — MAU Note (Signed)
Pt here with c/o vaginal bleeding and cramping. Had an ablation done in 2013; pt had known fibroids "and now they are larger."

## 2017-05-29 NOTE — Assessment & Plan Note (Addendum)
Patient seen at MAU this morning for fibroids and vaginal bleeding, which is very stressful to her. She attributes this migrated to stress. Received Toradol and tramadol from the MAU. This helps some with her headache. Will give Decadron and Phenergan here. Instructed to follow-up with her PCP regarding migraine prophylaxis, would consider propranolol unless there is some contraindication to this, as well as possibly an alternate triptan. No weakness, no red flag symptoms, neuro exam normal.

## 2017-05-29 NOTE — Discharge Instructions (Signed)

## 2017-06-05 ENCOUNTER — Telehealth: Payer: Self-pay | Admitting: Family Medicine

## 2017-06-05 ENCOUNTER — Ambulatory Visit (INDEPENDENT_AMBULATORY_CARE_PROVIDER_SITE_OTHER): Payer: 59 | Admitting: Obstetrics & Gynecology

## 2017-06-05 VITALS — BP 149/91 | HR 71 | Ht 61.0 in | Wt 186.0 lb

## 2017-06-05 DIAGNOSIS — N8003 Adenomyosis of the uterus: Secondary | ICD-10-CM

## 2017-06-05 DIAGNOSIS — N800 Endometriosis of uterus: Secondary | ICD-10-CM

## 2017-06-05 DIAGNOSIS — N809 Endometriosis, unspecified: Secondary | ICD-10-CM

## 2017-06-05 DIAGNOSIS — R102 Pelvic and perineal pain: Secondary | ICD-10-CM | POA: Diagnosis not present

## 2017-06-05 DIAGNOSIS — N938 Other specified abnormal uterine and vaginal bleeding: Secondary | ICD-10-CM | POA: Diagnosis not present

## 2017-06-06 ENCOUNTER — Encounter: Payer: Self-pay | Admitting: General Practice

## 2017-06-06 ENCOUNTER — Encounter: Payer: Self-pay | Admitting: Obstetrics & Gynecology

## 2017-06-06 MED ORDER — GABAPENTIN 300 MG PO CAPS: 300.0000 mg | ORAL_CAPSULE | Freq: Three times a day (TID) | ORAL | 2 refills | Status: DC | PRN

## 2017-06-06 MED ORDER — MEGESTROL ACETATE 40 MG PO TABS: 80.0000 mg | ORAL_TABLET | Freq: Every day | ORAL | 2 refills | Status: DC

## 2017-06-06 MED ORDER — TRAMADOL HCL 50 MG PO TABS: 100.0000 mg | ORAL_TABLET | Freq: Four times a day (QID) | ORAL | 0 refills | Status: DC | PRN

## 2017-06-06 NOTE — Progress Notes (Signed)
GYNECOLOGY OFFICE VISIT NOTE (Late entry documentation for 8/8 @ 3pm visit due to [facility] downtime)  History:  51 y.o. [id number] here today for continued AUB s/p endometrial ablation.  Also has significant pain.  She desires hysterectomy for definitive surgical management, she is tired of her symptoms. Declines any long term hormonal management.  Pain is debilitating, not adequately treated by NSAIDs.  Moderate relief with Tramadol.  She denies any abnormal vaginal discharge or other concerns.   Past Medical History:  Diagnosis Date  . Anemia   . Anxiety   . Asthma   . Depression   . Dysrhythmia    Irregular heat w/ asthma episodes and anxiety  . GERD (gastroesophageal reflux disease)   . Migraines   . PONV (postoperative nausea and vomiting)   . Sleep apnea    uses CPAP  . Urinary incontinence    frequent urination- tx w/ vesicare    Past Surgical History:  Procedure Laterality Date  . ABLATION    . CESAREAN SECTION     x3  . DILATION AND CURETTAGE OF UTERUS  05/31/2005   MAB, TAB x 2  . HERNIA REPAIR  1990's  . HYSTEROSCOPY W/ ENDOMETRIAL ABLATION    . LEEP    . MAB     x 4  . MOUTH SURGERY     teeth extraction  . TUBAL LIGATION  11/01/2006  . WISDOM TOOTH EXTRACTION      The following portions of the patient's history were reviewed and updated as appropriate: allergies, current medications, past family history, past medical history, past social history, past surgical history and problem list.   Health Maintenance:  Normal pap and negative HRHPV on 05/08/2017.  Normal mammogram on 12/13/2015.   Review of Systems:  Pertinent items noted in HPI and remainder of comprehensive ROS otherwise negative.   Objective:  Physical Exam BP (!) 149/91   Pulse 71   Ht 5\' 1"  (1.549 m)   Wt 186 lb (84.4 kg)   LMP 05/21/2017 (Within Weeks)   BMI 35.14 kg/m  CONSTITUTIONAL: Well-developed, well-nourished female in no acute distress.  HENT:  Normocephalic, atraumatic. External  right and left ear normal. Oropharynx is clear and moist EYES: Conjunctivae and EOM are normal. Pupils are equal, round, and reactive to light. No scleral icterus.  NECK: Normal range of motion, supple, no masses SKIN: Skin is warm and dry. No rash noted. Not diaphoretic. No erythema. No pallor. NEUROLOGIC: Alert and oriented to person, place, and time. Normal reflexes, muscle tone coordination. No cranial nerve deficit noted. PSYCHIATRIC: Normal mood and affect. Normal behavior. Normal judgment and thought content. CARDIOVASCULAR: Normal heart rate noted RESPIRATORY: Effort and breath sounds normal, no problems with respiration noted ABDOMEN: Soft, no distention noted.   PELVIC: Deferred MUSCULOSKELETAL: Normal range of motion. No edema noted.  Labs and Imaging US Transvaginal Non-ob  Result Date: 05/20/2017 CLINICAL DATA:  Abnormal bleeding status post ablation in 2013 postmenopausal EXAM: TRANSABDOMINAL AND TRANSVAGINAL ULTRASOUND OF PELVIS TECHNIQUE: Both transabdominal and transvaginal ultrasound examinations of the pelvis were performed. Transabdominal technique was performed for global imaging of the pelvis including uterus, ovaries, adnexal regions, and pelvic cul-de-sac. It was necessary to proceed with endovaginal exam following the transabdominal exam to visualize the uterus and endometrium. COMPARISON:  04/13/2016 FINDINGS: Uterus Measurements: 13.6 x 6.2 x 10.4 cm. Heterogenous echotexture. Trans abdominal images demonstrate a left anterior myometrial mass measuring 4.3 x 3.5 x 3.7 cm. Endometrium Thickness: 10 mm.  No focal  abnormality visualized. Right ovary Measurements: 2.9 x 1.6 x 2.5 cm. Normal appearance/no adnexal mass. Seen only on transabdominal images Left ovary Measurements: 3 x 2.3 x 2.8 cm. Normal appearance/no adnexal mass. Seen only on transabdominal images Other findings No abnormal free fluid. IMPRESSION: 1. Heterogenous enlarged uterus with at least 1 fibroid measuring  4.3 cm. 2. Endometrial thickness is 10 mm. In the setting of post-menopausal bleeding, endometrial sampling is indicated to exclude carcinoma. If results are benign, sonohysterogram should be considered for focal lesion work-up. (Ref: Radiological Reasoning: Algorithmic Workup of Abnormal Vaginal Bleeding with Endovaginal Sonography and Sonohysterography. AJR 2008; 789:F81-01) Electronically Signed   By: Donavan Foil M.D.   On: 05/20/2017 19:48   US Pelvis Complete  Result Date: 05/20/2017 CLINICAL DATA:  Abnormal bleeding status post ablation in 2013 postmenopausal EXAM: TRANSABDOMINAL AND TRANSVAGINAL ULTRASOUND OF PELVIS TECHNIQUE: Both transabdominal and transvaginal ultrasound examinations of the pelvis were performed. Transabdominal technique was performed for global imaging of the pelvis including uterus, ovaries, adnexal regions, and pelvic cul-de-sac. It was necessary to proceed with endovaginal exam following the transabdominal exam to visualize the uterus and endometrium. COMPARISON:  04/13/2016 FINDINGS: Uterus Measurements: 13.6 x 6.2 x 10.4 cm. Heterogenous echotexture. Trans abdominal images demonstrate a left anterior myometrial mass measuring 4.3 x 3.5 x 3.7 cm. Endometrium Thickness: 10 mm.  No focal abnormality visualized. Right ovary Measurements: 2.9 x 1.6 x 2.5 cm. Normal appearance/no adnexal mass. Seen only on transabdominal images Left ovary Measurements: 3 x 2.3 x 2.8 cm. Normal appearance/no adnexal mass. Seen only on transabdominal images Other findings No abnormal free fluid. IMPRESSION: 1. Heterogenous enlarged uterus with at least 1 fibroid measuring 4.3 cm. 2. Endometrial thickness is 10 mm. In the setting of post-menopausal bleeding, endometrial sampling is indicated to exclude carcinoma. If results are benign, sonohysterogram should be considered for focal lesion work-up. (Ref: Radiological Reasoning: Algorithmic Workup of Abnormal Vaginal Bleeding with Endovaginal Sonography  and Sonohysterography. AJR 2008; 751:W25-85) Electronically Signed   By: Donavan Foil M.D.   On: 05/20/2017 19:48    Assessment & Plan:  1. DUB (dysfunctional uterine bleeding) Megace prescribed for now, bleeding precautions reviewed. - megestrol (MEGACE) 40 MG tablet; Take 2 tablets (80 mg total) by mouth daily.  Dispense: 60 tablet; Refill: 2  2. Pelvic pain in female 3. Adenomyosis Other pain medications prescribed as needed. - traMADol (ULTRAM) 50 MG tablet; Take 2 tablets (100 mg total) by mouth every 6 (six) hours as needed.  Dispense: 30 tablet; Refill: 0 - gabapentin (NEURONTIN) 300 MG capsule; Take 1 capsule (300 mg total) by mouth every 8 (eight) hours as needed.  Dispense: 60 capsule; Refill: 2  Patient desires definitive management with hysterectomy.  I proposed doing total abdominal hysterectomy (TAH) and bilateral salpingoophorectomy given size of her uterus and three previous cesarean sections (she reports being told of having significant scar tissue).  Patient agrees with this proposed surgery.  The risks of surgery were discussed in detail with the patient including but not limited to: bleeding which may require transfusion or reoperation; infection which may require antibiotics; injury to bowel, bladder, ureters or other surrounding organs; need for additional procedures including laparotomy; thromboembolic phenomenon, incisional problems and other postoperative/anesthesia complications.  Patient was also advised that she will remain in house for 2 nights; and expected recovery time after a hysterectomy is 6-8 weeks.  Likelihood of success in alleviating the patient's symptoms was discussed.   She was told that she will be contacted by  our surgical scheduler regarding the time and date of her surgery; routine preoperative instructions of having nothing to eat or drink after midnight on the day prior to surgery and also coming to the hospital 1.5 hours prior to her time of surgery  were also emphasized.  She was told she may be called for an office preoperative appointment about a few weeks before her surgery as she needs endometrial biopsy.  She will also need Anesthesiology preoperative evaluation about a week prior to surgery and will be given further preoperative instructions at that visit.  Given her cardiac history, she was told to obtain medical clearance from her PCP.  Routine postoperative instructions will be reviewed with the patient and her family in detail after surgery.  Printed patient education handouts about the procedure was given to the patient to review at home.   Please refer to After Visit Summary for other counseling recommendations.   Return for Preoperative endometrial biopsy.   Total face-to-face time with patient: 25 minutes. Over 50% of encounter was spent on counseling and coordination of care.   Verita Schneiders, MD, [facility], [facility] [employer], [place]

## 2017-06-07 NOTE — Patient Instructions (Signed)
Abdominal Hysterectomy °Abdominal hysterectomy is a surgical procedure to remove the womb (uterus). The uterus is the muscular organ that houses a developing baby. This surgery may be done if: °· You have cancer. °· You have growths (tumors or fibroids) in the uterus. °· You have long-term (chronic) pain. °· You are bleeding. °· Your uterus has slipped down into your vagina (uterine prolapse). °· You have a condition in which the tissue that lines the uterus grows outside of its normal location (endometriosis). °· You have an infection in your uterus. °· You are having problems with your menstrual cycle. ° °Depending on why you are having this procedure, you may also have other reproductive organs removed. These could include: °· The part of your vagina that connects with your uterus (cervix). °· The organs that make eggs (ovaries). °· The tubes that connect the ovaries to the uterus (fallopian tubes). ° °Tell a health care provider about: °· Any allergies you have. °· All medicines you are taking, including vitamins, herbs, eye drops, creams, and over-the-counter medicines. °· Any problems you or family members have had with anesthetic medicines. °· Any blood disorders you have. °· Any surgeries you have had. °· Any medical conditions you have. °· Whether you are pregnant or may be pregnant. °What are the risks? °Generally, this is a safe procedure. However, problems may occur, including: °· Bleeding. °· Infection. °· Allergic reactions to medicines or dyes. °· Damage to other structures or organs. °· Nerve injury. °· Decreased interest in sex or pain during sex. °· Blood clots that can break free and travel to your lungs. ° °What happens before the procedure? °Staying hydrated °Follow instructions from your health care provider about hydration, which may include: °· Up to 2 hours before the procedure - you may continue to drink clear liquids, such as water, clear fruit juice, black coffee, and plain tea ° °Eating  and drinking restrictions °Follow instructions from your health care provider about eating and drinking, which may include: °· 8 hours before the procedure - stop eating heavy meals or foods such as meat, fried foods, or fatty foods. °· 6 hours before the procedure - stop eating light meals or foods, such as toast or cereal. °· 6 hours before the procedure - stop drinking milk or drinks that contain milk. °· 2 hours before the procedure - stop drinking clear liquids. ° °Medicines °· Ask your health care provider about: °? Changing or stopping your regular medicines. This is especially important if you are taking diabetes medicines or blood thinners. °? Taking medicines such as aspirin and ibuprofen. These medicines can thin your blood. Do not take these medicines before your procedure if your health care provider instructs you not to. °· You may be given antibiotic medicine to help prevent infection. Take it as told by your health care provider. °· You may be asked to take laxatives to prevent constipation. °General instructions °· Ask your health care provider how your surgical site will be marked or identified. °· You may be asked to shower with a germ-killing soap. °· Plan to have someone take you home from the hospital. °· Do not use any products that contain nicotine or tobacco, such as cigarettes and e-cigarettes. If you need help quitting, ask your health care provider. °· You may have an exam or testing. °· You may have a blood or urine sample taken. °· You may need to have an enema to clean out your rectum and lower colon. °· This   procedure can affect the way you feel about yourself. Talk to your health care provider about the physical and emotional changes this procedure may cause. °What happens during the procedure? °· To lower your risk of infection: °? Your health care team will wash or sanitize their hands. °? Your skin will be washed with soap. °? Hair may be removed from the surgical area. °· An IV  tube will be inserted into one of your veins. °· You will be given one or more of the following: °? A medicine to help you relax (sedative). °? A medicine to make you fall asleep (general anesthetic). °· Tight-fitting (compression) stockings will be placed on your legs to promote circulation. °· A thin, flexible tube (catheter) will be inserted to help drain your urine. °· The surgeon will make a cut (incision) through the skin in your lower belly. The incision may go side-to-side or up-and-down. °· The surgeon will move aside the body tissue that covers your uterus. The surgeon will then carefully take out your uterus along with any of the other organs that need to be removed. °· Bleeding will be controlled with clamps or sutures. °· The surgeon will close your incision with stitches (sutures), skin glue, or adhesive strips. °· A bandage (dressing) will be placed over the incision. °The procedure may vary among health care providers and hospitals. °What happens after the procedure? °· You will be given pain medicine as needed. °· Your blood pressure, heart rate, breathing rate, and blood oxygen level will be monitored until the medicines you were given have worn off. °· You will need to stay in the hospital to recover for one to two days. Ask your health care provider how long you will need to stay in the hospital after your procedure. °· You may have a liquid diet at first. You will most likely return to your usual diet the day after surgery. °· You will still have the urinary catheter in place. It will likely be removed the day after surgery. °· You may have to wear compression stockings. These stockings help to prevent blood clots and reduce swelling in your legs. °· You will be encouraged to walk as soon as possible. You will also use a device or do breathing exercises to keep your lungs clear. °· You may need to use a sanitary napkin for vaginal discharge. °Summary °· Abdominal hysterectomy is a surgical  procedure to remove the womb (uterus). The uterus is the muscular organ that houses a developing baby. °· This procedure can affect the way you feel about yourself. Talk to your health care provider about the physical and emotional changes this procedure may cause. °· You will be given medicines for pain after the procedure. °· You will need to stay in the hospital to recover. Ask your health care provider how long you will need to stay in the hospital after your procedure. °This information is not intended to replace advice given to you by your health care provider. Make sure you discuss any questions you have with your health care provider. °Document Released: 10/20/2013 Document Revised: 10/03/2016 Document Reviewed: 10/03/2016 °Elsevier Interactive Patient Education © 2017 [employer] ° °

## 2017-06-10 ENCOUNTER — Ambulatory Visit (INDEPENDENT_AMBULATORY_CARE_PROVIDER_SITE_OTHER): Payer: 59 | Admitting: Family Medicine

## 2017-06-10 ENCOUNTER — Encounter: Payer: Self-pay | Admitting: Family Medicine

## 2017-06-10 VITALS — BP 110/70 | HR 74 | Temp 98.3°F | Ht 61.0 in | Wt 191.0 lb

## 2017-06-10 DIAGNOSIS — J4541 Moderate persistent asthma with (acute) exacerbation: Secondary | ICD-10-CM | POA: Diagnosis not present

## 2017-06-10 DIAGNOSIS — R7303 Prediabetes: Secondary | ICD-10-CM

## 2017-06-10 DIAGNOSIS — G43719 Chronic migraine without aura, intractable, without status migrainosus: Secondary | ICD-10-CM | POA: Diagnosis not present

## 2017-06-10 LAB — POCT GLYCOSYLATED HEMOGLOBIN (HGB A1C): HEMOGLOBIN A1C: 5.7

## 2017-06-10 NOTE — Assessment & Plan Note (Signed)
Patient complains of increased migraine headaches which affect her day to day life and prevent her from going to work. -will refer to neurology for further work up -have not increased her days off on FMLA, if further days needed will need approval from neurology  -continue to avoid triggers if possible -continue counseling for anxiety/depression

## 2017-06-10 NOTE — Telephone Encounter (Signed)
Left copy of FMLA form for patient at front desk. Patient requested copy for her records. Please inform patient to pick up form at her convenience.   Thank you,  Caroline More

## 2017-06-10 NOTE — Assessment & Plan Note (Signed)
Patient states her asthma is worsening and has missed increasing days of work. -is managed by pulmonology -advised to follow up with pulmonology to consider medication adjustment  -avoid triggers if possible

## 2017-06-10 NOTE — Patient Instructions (Signed)
Migraine Headache A migraine headache is a very strong throbbing pain on one side or both sides of your head. Migraines can also cause other symptoms. Talk with your doctor about what things may bring on (trigger) your migraine headaches. Follow these instructions at home: Medicines  Take over-the-counter and prescription medicines only as told by your doctor.  Do not drive or use heavy machinery while taking prescription pain medicine.  To prevent or treat constipation while you are taking prescription pain medicine, your doctor may recommend that you: ? Drink enough fluid to keep your pee (urine) clear or pale yellow. ? Take over-the-counter or prescription medicines. ? Eat foods that are high in fiber. These include fresh fruits and vegetables, whole grains, and beans. ? Limit foods that are high in fat and processed sugars. These include fried and sweet foods. Lifestyle  Avoid alcohol.  Do not use any products that contain nicotine or tobacco, such as cigarettes and e-cigarettes. If you need help quitting, ask your doctor.  Get at least 8 hours of sleep every night.  Limit your stress. General instructions   Keep a journal to find out what may bring on your migraines. For example, write down: ? What you eat and drink. ? How much sleep you get. ? Any change in what you eat or drink. ? Any change in your medicines.  If you have a migraine: ? Avoid things that make your symptoms worse, such as bright lights. ? It may help to lie down in a dark, quiet room. ? Do not drive or use heavy machinery. ? Ask your doctor what activities are safe for you.  Keep all follow-up visits as told by your doctor. This is important. Contact a doctor if:  You get a migraine that is different or worse than your usual migraines. Get help right away if:  Your migraine gets very bad.  You have a fever.  You have a stiff neck.  You have trouble seeing.  Your muscles feel weak or like you  cannot control them.  You start to lose your balance a lot.  You start to have trouble walking.  You pass out (faint). This information is not intended to replace advice given to you by your health care provider. Make sure you discuss any questions you have with your health care provider. Document Released: 07/24/2008 Document Revised: 05/04/2016 Document Reviewed: 04/02/2016 Elsevier Interactive Patient Education  2017 [employer].  It was a pleasure meeting you today.  Today we discussed asthma and migraines. For your FMLA form for asthma please call your pulmonologist to fill this out. For your migraines I have referred you to a neurologist for further work up since they are interfering with your day to day activities.  Please return as needed for follow up or sooner if symptoms persist or worsen.   Thank you,  Caroline More

## 2017-06-10 NOTE — Progress Notes (Signed)
Subjective:    Patient ID: [name], female    DOB: 03-04-1966, 51 y.o.   MRN: [id number]   CC: FMLA paperwork  HPI:  FMLA paperwork  Patient presents for [facility] paperwork completion. Multiple faxes were received regarding paperwork. According to [employer], patient had been missing more work than allotted by her FMLA form written in May by Dr. Collene Schlichter. Patient was informed that she would need to be seen in order to fill out new paperwork. Patient originally had paperwork due to her history of asthma and migraine headaches.   Asthma Patient today states she has chronic asthma that is flared by heat, rain, dust and pet dander. Patient states the head makes her SOB causing her asthma to flare. Due to this she has had to miss multiple days of work. During these asthma exacerbations patient begins to wheeze and has persistent cough, to the point of occasional post tussive emesis. Patient is followed by a pulmonologist, who patient states diagnosed her with COPD. Patient uses Spiriva bid and rescue inhaler prn but uses up to twice a week. Patient also used Albuterol nebulizer prn, but has not needed to use in one month. Patient states these episodes occur twice a week and are worse at night. Patient was last seen by pulmonology in June. Patient has a pet dog but uses a mask when cleaning his crate. Last asthma attack was 2 weeks ago.   Migraines Patient also reports severe migraines which interfere with her day to day activities. Patient states that last migraine was Wednesday. Patient states that when migraines occur, she can have changes in vision and can sometimes lose vision in one eye. Triggers for migraines include stress and certain foods (most notably asparagus). Patient was taking propranolol but was discontinued from medication 4-5 months ago by previous PCP. Patient has tried botox injections in the best which allowed her to be migraine free x 3 years, but patient did not receive other doses  due to pain of shots. Recent stressors include grandmother passing away and her son was recently shot. Patient reports migraine 2 weeks ago that lasted 4-5 days. Migraine last Wednesday caused her entire left eye to turn red. Patient reports compliance with CPAP machine and state this has helped. Migraines involve area from back of head to top and cause photophobia, phonophobia, nausea, and vomiting. Patient states laughing from asthma causes migraines.   Smoking status reviewed. Non smoker, not around any smokers.   Review of Systems All negative other than noted in HPI  Objective:  BP 110/70   Pulse 74   Temp 98.3 F (36.8 C) (Oral)   Ht 5\' 1"  (1.549 m)   Wt 191 lb (86.6 kg)   LMP 05/21/2017 (Within Weeks)   SpO2 98%   BMI 36.09 kg/m  Vitals and nursing note reviewed  General: well nourished, in no acute distress HEENT: normocephalic, TM's visualized bilaterally, no scleral icterus or conjunctival pallor, no nasal discharge, moist mucous membranes, good dentition  Neck: supple, non-tender, without lymphadenopathy Cardiac: RRR, clear S1 and S2, no murmurs, rubs, or gallops Respiratory: clear to auscultation bilaterally, no increased work of breathing Abdomen: soft, tenderness in lower abdomen due to fibroids, no masses or organomegaly. Bowel sounds present Extremities: no edema or cyanosis. Warm, well perfused. 2+ radial and PT pulses bilaterally Skin: warm and dry, no rashes noted Neuro: alert and oriented, no focal deficits, CN 2-12 intact, 5/5 muscle strength, sensation intact bilaterally   Assessment & Plan:  Migraine Patient complains of increased migraine headaches which affect her day to day life and prevent her from going to work. -will refer to neurology for further work up -have not increased her days off on FMLA, if further days needed will need approval from neurology  -continue to avoid triggers if possible -continue counseling for anxiety/depression    Asthma Patient states her asthma is worsening and has missed increasing days of work. -is managed by pulmonology -advised to follow up with pulmonology to consider medication adjustment  -avoid triggers if possible     Return if symptoms worsen or fail to improve.  Patient agreed with plan and stated she would follow up with neurology.   Caroline More, DO, PGY-1

## 2017-06-11 IMAGING — CT CT RENAL STONE PROTOCOL
2 of 4 series · 17 of 46 positions shown, 19 images · non-contrast
Comparison: 01/18/2016, 03/14/2010

CLINICAL DATA: Right-sided abdominal pain

EXAM:
CT ABDOMEN AND PELVIS WITHOUT CONTRAST
TECHNIQUE: Multidetector CT imaging of the abdomen and pelvis was performed
following the standard protocol without IV contrast.

[Series 2: axial st · axial · 0.89mm/px · z∈[-305,+80]mm · 14 of 85 slices shown, 16 images]
[im 4/85  soft-tissue]
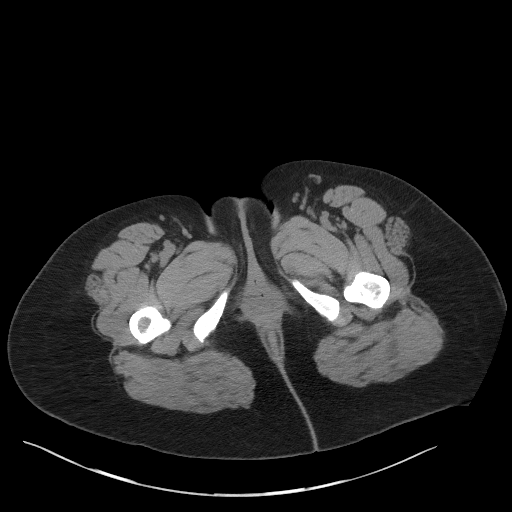
[im 4/85  bone]
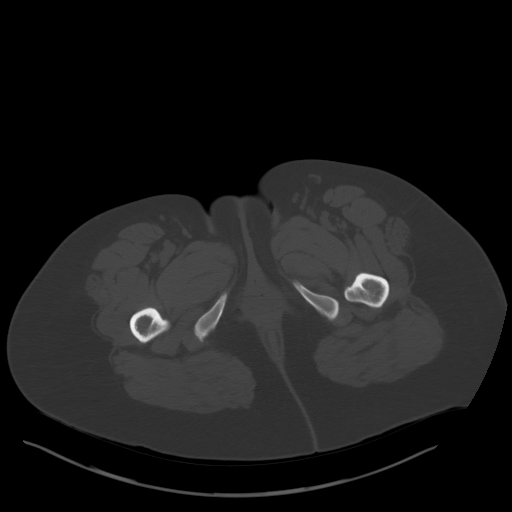
[im 11/85  soft-tissue]
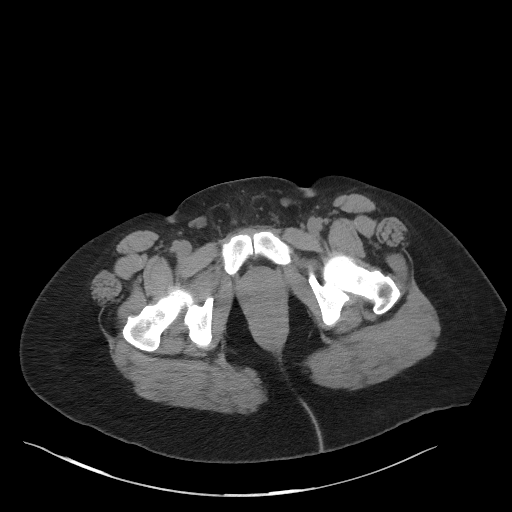
[im 15/85  soft-tissue]
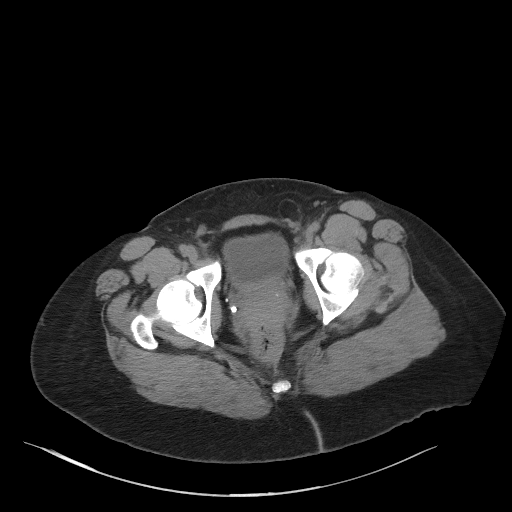
[im 22/85  soft-tissue]
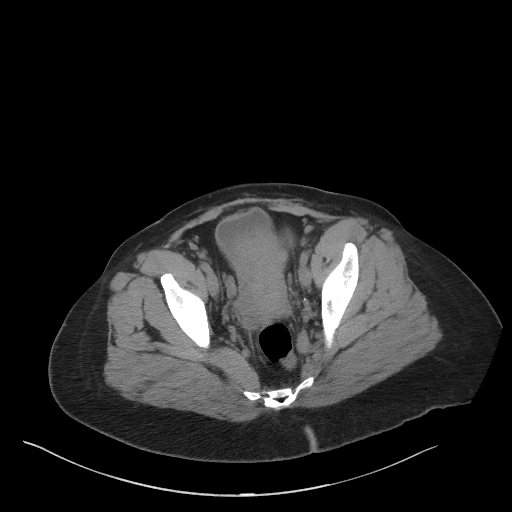
[im 30/85  soft-tissue]
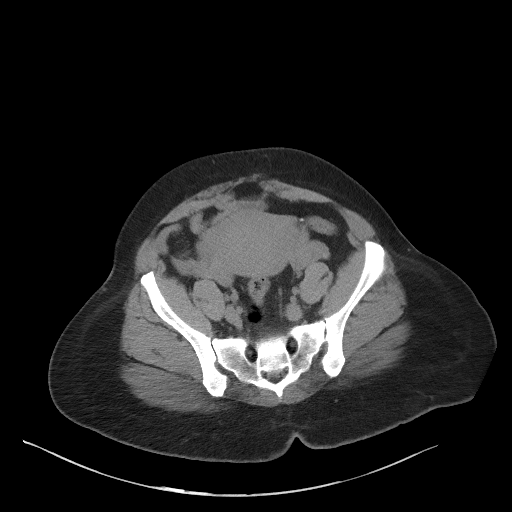
[im 33/85  soft-tissue]
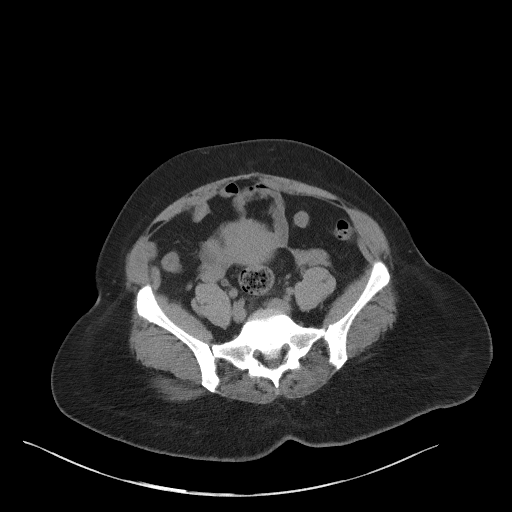
[im 41/85  soft-tissue]
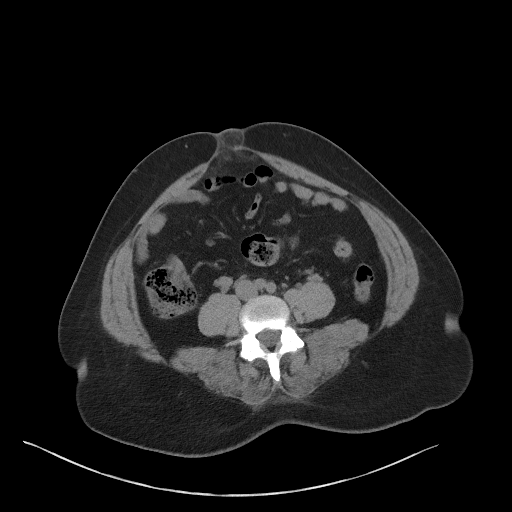
[im 44/85  soft-tissue]
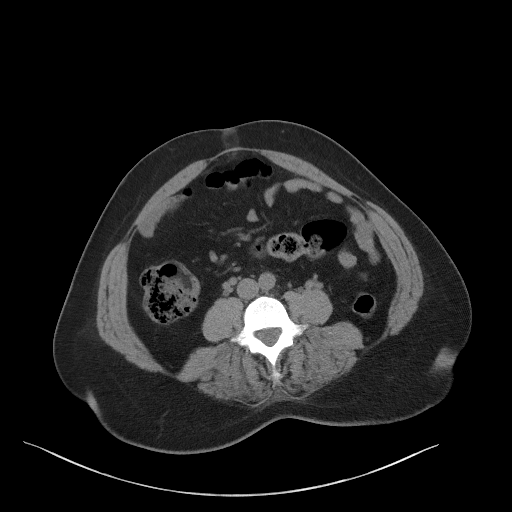
[im 52/85  soft-tissue]
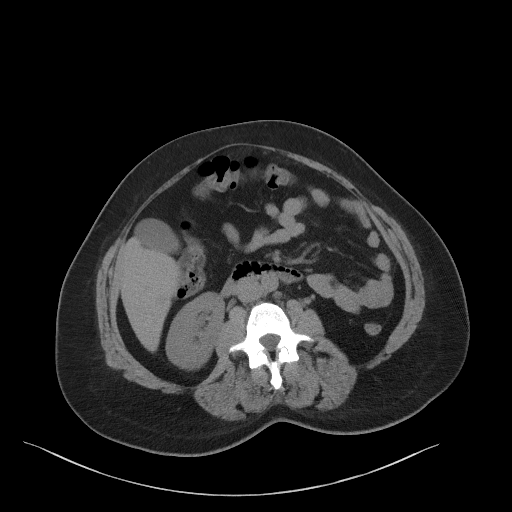
[im 52/85  bone]
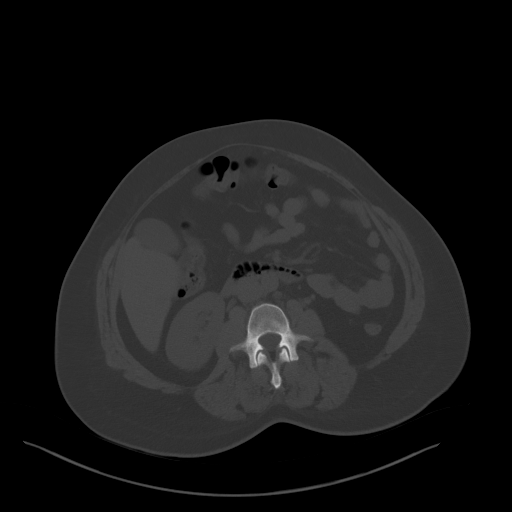
[im 55/85  soft-tissue]
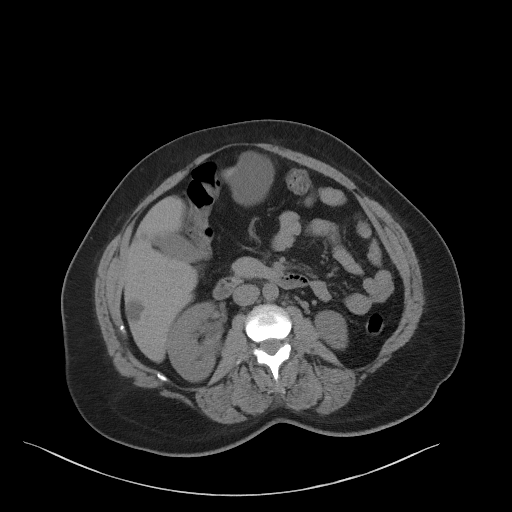
[im 63/85  soft-tissue]
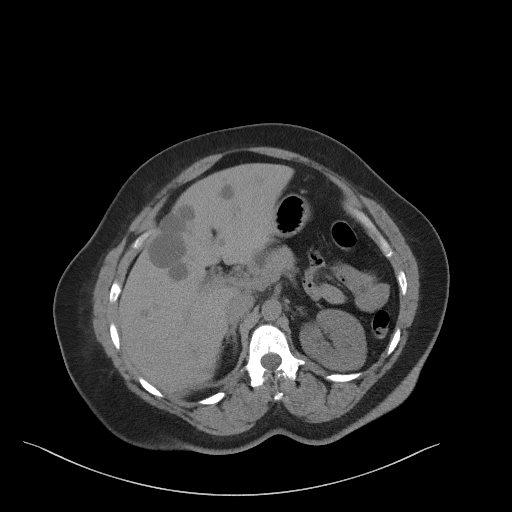
[im 70/85  soft-tissue]
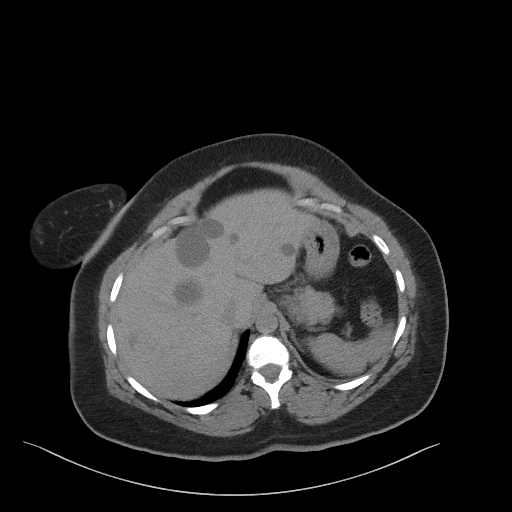
[im 74/85  soft-tissue]
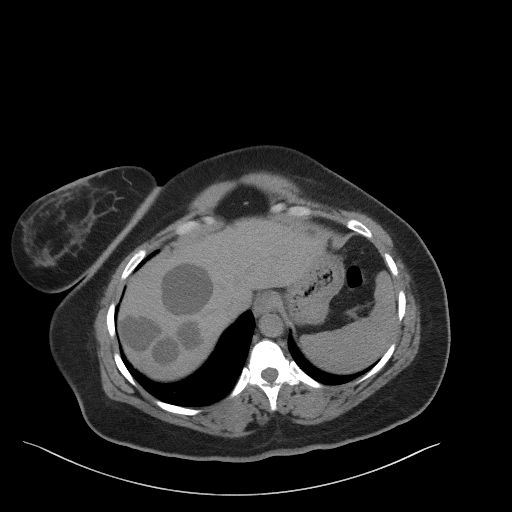
[im 81/85  soft-tissue]
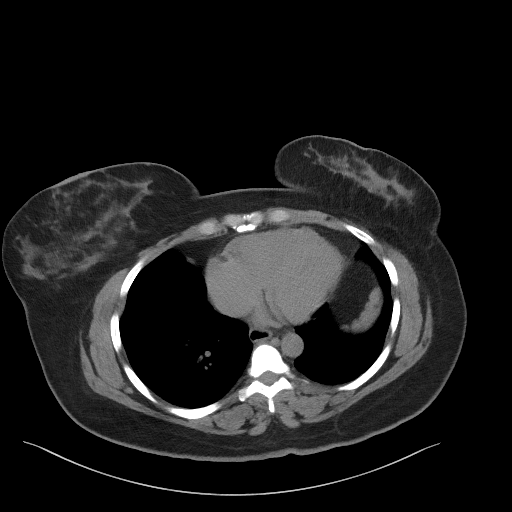

[Series 4: coronal st · coronal · 0.85mm/px · 3 of 92 slices shown]
[im 31/92  soft-tissue]
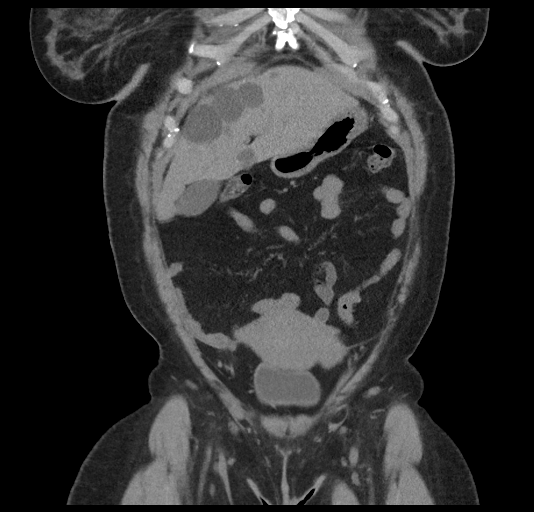
[im 41/92  soft-tissue]
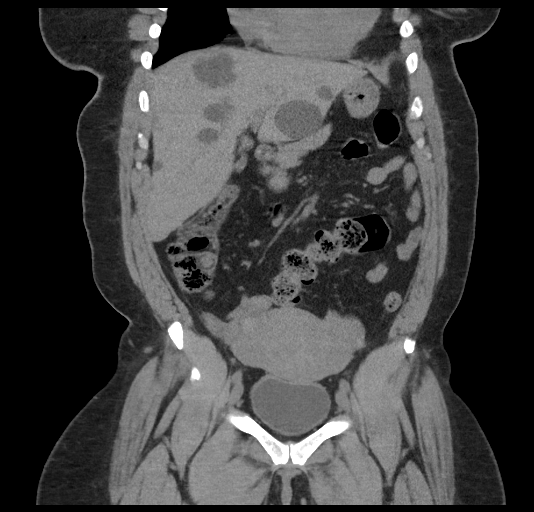
[im 51/92  soft-tissue]
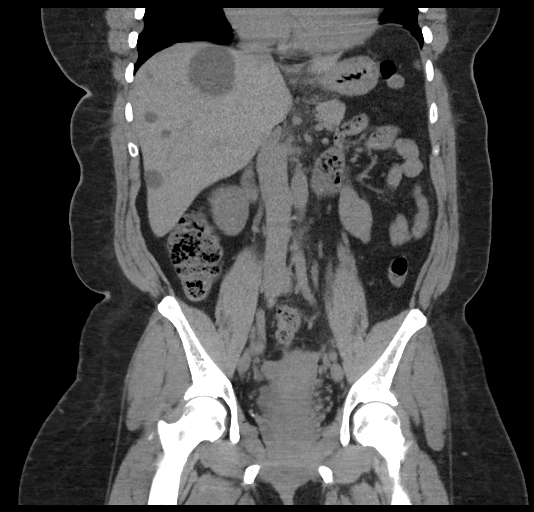

[17 of 46 positions shown; findings below may reference images not displayed]

FINDINGS: Lung bases are free of acute infiltrate or sizable effusion.

Multiple hepatic cysts are again identified. These have increased
slightly in size and number when compared with the prior exam
although all appear benign. The spleen, adrenal glands and pancreas
are within normal limits. The gallbladder is well visualized and
unremarkable. The kidneys show no evidence of renal calculi or
urinary tract obstructive changes. The ureters and urinary bladder
are within normal limits.

The uterus is somewhat bulky consistent with uterine fibroid change
similar to that seen on the prior exam.

The appendix is within normal limits. No free pelvic fluid is seen.
No significant diverticular changes noted. The osseous structures
show no acute abnormality.
IMPRESSION: Chronic changes as described above.  No acute abnormality is noted.

## 2017-06-11 NOTE — Telephone Encounter (Signed)
Left message on voicemail informing paperwork was ready to be picked up.

## 2017-06-12 ENCOUNTER — Telehealth: Payer: Self-pay

## 2017-06-12 DIAGNOSIS — I5030 Unspecified diastolic (congestive) heart failure: Secondary | ICD-10-CM

## 2017-06-12 NOTE — Telephone Encounter (Signed)
Called stating that Dr. Harolyn Rutherford wanted her to see her cardiologist but she needs a referral because she hasn't been to their office in 2 years.Please return call.

## 2017-06-17 ENCOUNTER — Encounter (HOSPITAL_COMMUNITY): Payer: Self-pay

## 2017-06-26 ENCOUNTER — Ambulatory Visit: Payer: Self-pay | Admitting: Obstetrics & Gynecology

## 2017-07-05 ENCOUNTER — Encounter: Payer: Self-pay | Admitting: Internal Medicine

## 2017-07-05 ENCOUNTER — Ambulatory Visit (INDEPENDENT_AMBULATORY_CARE_PROVIDER_SITE_OTHER): Payer: 59 | Admitting: Internal Medicine

## 2017-07-05 VITALS — BP 122/78 | HR 62 | Ht 64.0 in | Wt 191.0 lb

## 2017-07-05 DIAGNOSIS — I10 Essential (primary) hypertension: Secondary | ICD-10-CM

## 2017-07-05 DIAGNOSIS — I872 Venous insufficiency (chronic) (peripheral): Secondary | ICD-10-CM

## 2017-07-05 DIAGNOSIS — R600 Localized edema: Secondary | ICD-10-CM

## 2017-07-05 DIAGNOSIS — Z01810 Encounter for preprocedural cardiovascular examination: Secondary | ICD-10-CM | POA: Diagnosis not present

## 2017-07-05 NOTE — Patient Instructions (Signed)
Medication Instructions: Your physician recommends that you continue on your current medications as directed. Please refer to the Current Medication list given to you today.  If you need a refill on your cardiac medications before your next appointment, please call your pharmacy.   Follow-Up: Your physician wants you to follow-up as needed   Thank you for choosing [facility]!!      

## 2017-07-08 NOTE — Progress Notes (Addendum)
OFFICE CONSULT NOTE  Chief Complaint:  Preoperative risk assessment, edema  Primary Care Physician: Caroline More, DO  HPI:  [name] is a 51 y.o. female who is being seen today for the evaluation of preoperative risk assessment and edema at the request of Caroline More, DO. [name] was referred by her OB/GYN for evaluation of possible congestive heart failure. She apparently was told by an orthopedist that she may have congestive heart failure given her lower extremity swelling. This apparently is been a chronic problem for her. 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 She does have a history of asthma/COPD, migraine headaches, C-sections and a hernia repair in the past as well as a family history of congestive heart failure in her grandmother who died at age 84. She had an echocardiogram in September 2016 for shortness of breath. She was found to have a normal EF of 65-70% with mild LVH and mild grade 1 diastolic dysfunction. RVSP could not be estimated. She's not had any further studies. Currently she denies any chest pain, orthopnea, PND, weight gain, or associated symptoms. She does report intermittent swelling of her legs which improves with elevation. She has been having dysfunctional uterine bleeding and is being contemplated for hysterectomy. Apparently she had prior endometrial ablation. She reports being able to do more than 4 metabolic equivalents of activity, including walking up one flight of stairs without stopping for shortness of breath. She does report some daytime fatigue and had an abnormal Epworth sleepiness scale score of 11.  PMHx:  Past Medical History:  Diagnosis Date  . Anemia   . Anxiety   . Asthma   . Depression   . Dysrhythmia    Irregular heat w/ asthma episodes and anxiety  . GERD (gastroesophageal reflux disease)   . Migraines   . PONV (postoperative nausea and vomiting)   . Sleep apnea    uses CPAP  . Urinary  incontinence    frequent urination- tx w/ vesicare    Past Surgical History:  Procedure Laterality Date  . ABLATION    . CESAREAN SECTION     x3  . DILATION AND CURETTAGE OF UTERUS  05/31/2005   MAB, TAB x 2  . HERNIA REPAIR  1990's  . HYSTEROSCOPY W/ ENDOMETRIAL ABLATION    . LEEP    . MAB     x 4  . MOUTH SURGERY     teeth extraction  . TUBAL LIGATION  11/01/2006  . WISDOM TOOTH EXTRACTION      FAMHx:  Family History  Problem Relation Age of Onset  . Diabetes Maternal Grandmother   . Anesthesia problems Neg Hx     SOCHx:   reports that she quit smoking about 14 years ago. Her smoking use included Cigarettes. She has a 3.75 pack-year smoking history. She has never used smokeless tobacco. She reports that she drinks alcohol. She reports that she does not use drugs.  ALLERGIES:  Allergies  Allergen Reactions  . Amitriptyline     Abnormal behavior. Just doesn't feel like normal self   . Fluticasone-Salmeterol Other (See Comments)    Thrush even with mouth rinsing; worsens cough  . Penicillins Itching and Swelling  . Ciprofloxacin Itching and Rash  . Macrobid [Nitrofurantoin Monohyd Macro] Itching  . Sulfamethoxazole-Trimethoprim Rash    ROS: Pertinent items noted in HPI and remainder of comprehensive ROS otherwise negative.  HOME MEDS: Current Outpatient Prescriptions on File Prior to Visit  Medication Sig Dispense Refill  . albuterol (PROVENTIL HFA;VENTOLIN HFA) 108 (90 Base) MCG/ACT inhaler 2 puffs every 3-4 hours as needed for shortness of breath\wheezing\recurrent cough 1 Inhaler 6  . albuterol (PROVENTIL) (2.5 MG/3ML) 0.083% nebulizer solution Take 3 mLs (2.5 mg total) by nebulization every 4 (four) hours as needed. 75 mL 6  . baclofen (LIORESAL) 10 MG tablet Take 1 tablet (10 mg total) by mouth 3 (three) times daily as needed (headaches). 60 each 1  . BIOTIN PO Take 2,000 mg by mouth daily.    . cetirizine (ZYRTEC) 10 MG tablet Take 1 tablet (10 mg total) by  mouth daily. 30 tablet 11  . diclofenac (VOLTAREN) 75 MG EC tablet Take 1 tablet (75 mg total) by mouth 2 (two) times daily with a meal. 60 tablet 0  . escitalopram (LEXAPRO) 20 MG tablet Take 1 tablet (20 mg total) by mouth daily. 30 tablet 3  . esomeprazole (NEXIUM) 40 MG capsule TAKE 1 CAPSULE BY MOUTH ONCE DAILY 30 capsule 6  . estrogen, conjugated,-medroxyprogesterone (PREMPRO) 0.3-1.5 MG per tablet Take 1 tablet by mouth daily. 30 tablet 12  . fluticasone (FLONASE) 50 MCG/ACT nasal spray PLACE 2 SPRAYS INTO THE NOSE DAILY. 16 g 6  . furosemide (LASIX) 20 MG tablet TAKE 1 TABLET BY MOUTH DAILY. 30 tablet 3  . gabapentin (NEURONTIN) 300 MG capsule Take 1 capsule (300 mg total) by mouth every 8 (eight) hours as needed. 60 capsule 2  . losartan (COZAAR) 50 MG tablet TAKE 1 TABLET BY MOUTH ONCE DAILY 30 tablet 1  . megestrol (MEGACE) 40 MG tablet Take 2 tablets (80 mg total) by mouth daily. 60 tablet 2  . mometasone-formoterol (DULERA) 200-5 MCG/ACT AERO Inhale 2 puffs into the lungs 2 (two) times daily. 8.8 g 5  . montelukast (SINGULAIR) 10 MG tablet Take 1 tablet (10 mg total) by mouth at bedtime. 90 tablet 3  . naproxen (NAPROSYN) 500 MG tablet Take 1 tablet (500 mg total) by mouth 2 (two) times daily with a meal. 60 tablet 0  . ondansetron (ZOFRAN) 4 MG tablet Take 1 tablet (4 mg total) by mouth every 8 (eight) hours as needed for nausea or vomiting. 6 tablet 0  . oxybutynin (DITROPAN-XL) 10 MG 24 hr tablet Take 1 tablet (10 mg total) by mouth at bedtime. 30 tablet 0  . propranolol ER (INDERAL LA) 80 MG 24 hr capsule Take 1 capsule (80 mg total) by mouth daily. 30 capsule 2  . Pyridoxine HCl (VITAMIN B-6 PO) Take 1 tablet by mouth daily. Unknown OTC strength    . simvastatin (ZOCOR) 40 MG tablet TAKE 1 TABLET BY MOUTH AT BEDTIME. 90 tablet 3  . Spacer/Aero-Holding Chambers (AEROCHAMBER MV) inhaler Use as instructed 1 each 0  . Tiotropium Bromide Monohydrate (SPIRIVA RESPIMAT) 2.5 MCG/ACT AERS  Inhale 2 puffs into the lungs daily. 4 g 3  . traMADol (ULTRAM) 50 MG tablet Take 2 tablets (100 mg total) by mouth every 6 (six) hours as needed. 30 tablet 0  . vitamin C (ASCORBIC ACID) 500 MG tablet Take 1,000 mg by mouth daily.    . [DISCONTINUED] medroxyPROGESTERone (DEPO-PROVERA) 150 MG/ML injection Inject 1 mL (150 mg total) into the muscle every 3 (three) months. 1 mL 0  . [DISCONTINUED] medroxyPROGESTERone (PROVERA) 10 MG tablet Take 2 tablets (20 mg total) by mouth daily. 60 tablet 2   No current facility-administered medications on file prior to visit.     LABS/IMAGING: No results found for this  or any previous visit (from the past 48 hour(s)). No results found.  LIPID PANEL:    Component Value Date/Time   CHOL 189 08/16/2015 1106   TRIG 122 08/16/2015 1106   HDL 49 08/16/2015 1106   CHOLHDL 3.9 08/16/2015 1106   VLDL 24 08/16/2015 1106   LDLCALC 116 08/16/2015 1106    WEIGHTS: Wt Readings from Last 3 Encounters:  07/05/17 191 lb (86.6 kg)  06/10/17 191 lb (86.6 kg)  06/06/17 186 lb (84.4 kg)    VITALS: BP 122/78   Pulse 62   Ht 5\' 4"  (1.626 m)   Wt 191 lb (86.6 kg)   BMI 32.79 kg/m   EXAM: General appearance: alert and no distress Neck: no carotid bruit, no JVD and thyroid not enlarged, symmetric, no tenderness/mass/nodules Lungs: clear to auscultation bilaterally Heart: regular rate and rhythm, S1, S2 normal and no S3 or S4 Abdomen: soft, non-tender; bowel sounds normal; no masses,  no organomegaly Extremities: edema Trace pedal Pulses: 2+ and symmetric Skin: Skin color, texture, turgor normal. No rashes or lesions Neurologic: Grossly normal Psych: Pleasant  EKG: Normal sinus rhythm at 62 - personally reviewed  ASSESSMENT: 1. Low risk for hysterectomy 2. No evidence for diastolic congestive heart failure 3. Edema secondary to venous insufficiency 4. Possible sleep apnea-EPWSS score of 11  PLAN: 1.   [name] is at low risk for surgery. There  is no evidence for diastolic congestive heart failure as she only had mild diastolic dysfunction in September without symptoms suggestive of that. She takes a diuretic which generally helps with her swelling. Elevation of her legs is also helpful. I do not see further indication for repeat echocardiography. She should continue to use her Lasix and consider bilateral knee high 20-30 mmHg compression stockings when seated or standing for long periods of time. 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 She denied any witnessed apnea, but was told she snores. Will defer to PCP for consideration of a sleep study.  Thanks for the kind referral. Follow-up with me as needed.  Pixie Casino, MD, [place]  Attending Cardiologist  Direct Dial: [phone]  Fax: [phone]  Website:  [email or web address] 07/08/2017, 4:47 PM

## 2017-07-15 NOTE — Progress Notes (Signed)
07/15/17 4:42 Addendeum: FMLA's completed.

## 2017-07-20 ENCOUNTER — Encounter (HOSPITAL_BASED_OUTPATIENT_CLINIC_OR_DEPARTMENT_OTHER): Payer: Self-pay | Admitting: Emergency Medicine

## 2017-07-20 ENCOUNTER — Emergency Department (HOSPITAL_BASED_OUTPATIENT_CLINIC_OR_DEPARTMENT_OTHER)
Admission: EM | Admit: 2017-07-20 | Discharge: 2017-07-20 | Disposition: A | Discharge status: Home / Self Care | Payer: 59 | Attending: Emergency Medicine | Admitting: Emergency Medicine

## 2017-07-20 DIAGNOSIS — Z79899 Other long term (current) drug therapy: Secondary | ICD-10-CM | POA: Diagnosis not present

## 2017-07-20 DIAGNOSIS — Z87891 Personal history of nicotine dependence: Secondary | ICD-10-CM | POA: Insufficient documentation

## 2017-07-20 DIAGNOSIS — G43009 Migraine without aura, not intractable, without status migrainosus: Secondary | ICD-10-CM | POA: Diagnosis not present

## 2017-07-20 DIAGNOSIS — R51 Headache: Secondary | ICD-10-CM | POA: Diagnosis present

## 2017-07-20 DIAGNOSIS — I10 Essential (primary) hypertension: Secondary | ICD-10-CM | POA: Insufficient documentation

## 2017-07-20 DIAGNOSIS — J45909 Unspecified asthma, uncomplicated: Secondary | ICD-10-CM | POA: Diagnosis not present

## 2017-07-20 MED ORDER — PROCHLORPERAZINE EDISYLATE 5 MG/ML IJ SOLN
10.0000 mg | Freq: Once | INTRAMUSCULAR | Status: AC
  Administered 2017-07-20: 10 mg via INTRAVENOUS
  Filled 2017-07-20: qty 2

## 2017-07-20 MED ORDER — DIPHENHYDRAMINE HCL 50 MG/ML IJ SOLN
12.5000 mg | Freq: Once | INTRAMUSCULAR | Status: AC
  Administered 2017-07-20: 12.5 mg via INTRAVENOUS
  Filled 2017-07-20: qty 1

## 2017-07-20 MED ORDER — SODIUM CHLORIDE 0.9 % IV BOLUS (SEPSIS)
1000.0000 mL | Freq: Once | INTRAVENOUS | Status: AC
  Administered 2017-07-20: 1000 mL via INTRAVENOUS

## 2017-07-20 NOTE — ED Triage Notes (Signed)
Pt c/o HA since Tues and dizziness since yesterday, worse with standing; also c/o nausea; migraine meds not helping

## 2017-07-20 NOTE — Discharge Instructions (Signed)
Please read attached information regarding your condition. Follow up with your neurologist for further evaluation. Return to ED for worsening, severe headache, vision changes, falls, head injuries, chest pain, trouble breathing, numbness

## 2017-07-20 NOTE — ED Provider Notes (Signed)
[facility] Provider Note   CSN: [id number] Arrival date & time: 07/20/17  2542     History   Chief Complaint Chief Complaint  Patient presents with  . Headache    HPI [name] is a 51 y.o. female.  HPI Patient, with a past medical history of migraines, HTN, anxiety, depression, asthma, presents to ED for evaluation of intermittent, generalized headache for the past 4 days, with associated dizziness, feelings of "being off balance" and nausea. States that the headache began in the back of her head and has since radiated to the front. States this feels similar to her previous migraines, making it difficult for her to work. She has tried ibuprofen with some relief in symptoms. In the past, she received several Botox injections which provided her with 1-2 year relief in her migraines.  She denies any numbness, fever, chills, headache injury, falls, blood thinner use. She is scheduled to meet with her neurologist in 1 week.  Past Medical History:  Diagnosis Date  . Anemia   . Anxiety   . Asthma   . Depression   . Dysrhythmia    Irregular heat w/ asthma episodes and anxiety  . GERD (gastroesophageal reflux disease)   . Migraines   . PONV (postoperative nausea and vomiting)   . Sleep apnea    uses CPAP  . Urinary incontinence    frequent urination- tx w/ vesicare    Patient Active Problem List   Diagnosis Date Noted  . Sinusitis 02/25/2017  . Left knee pain 08/29/2016  . Bunion of great toe of right foot 08/29/2016  . Preoperative cardiovascular examination 08/01/2016  . Abdominal cramping 12/30/2015  . Bladder spasms 12/30/2015  . Pre-diabetes 10/03/2015  . HTN (hypertension) 10/03/2015  . Fatigue 07/21/2015  . Urinary urgency 07/06/2015  . Flank pain 07/06/2015  . Hot flashes 10/14/2014  . Vitamin D deficiency 11/25/2013  . Cough 09/10/2013  . Back pain 06/30/2013  . Migraine 06/07/2013  . Knee pain, bilateral 10/09/2012  . Obstructive sleep  apnea 09/08/2010  . HLD (hyperlipidemia) 09/20/2008  . Depression with anxiety 12/26/2006  . RHINITIS, ALLERGIC 12/26/2006  . Asthma 12/26/2006    Past Surgical History:  Procedure Laterality Date  . ABLATION    . CESAREAN SECTION     x3  . DILATION AND CURETTAGE OF UTERUS  05/31/2005   MAB, TAB x 2  . HERNIA REPAIR  1990's  . HYSTEROSCOPY W/ ENDOMETRIAL ABLATION    . LEEP    . MAB     x 4  . MOUTH SURGERY     teeth extraction  . TUBAL LIGATION  11/01/2006  . WISDOM TOOTH EXTRACTION      OB History    Gravida Para Term Preterm AB Living   15 8 8  0 7 4   SAB TAB Ectopic Multiple Live Births   2 1             Home Medications    Prior to Admission medications   Medication Sig Start Date End Date Taking? Authorizing Provider  SUMAtriptan (IMITREX) 50 MG tablet Take 50 mg by mouth every 2 (two) hours as needed for migraine. May repeat in 2 hours if headache persists or recurs.   Yes [provider]  albuterol (PROVENTIL HFA;VENTOLIN HFA) 108 (90 Base) MCG/ACT inhaler 2 puffs every 3-4 hours as needed for shortness of breath\wheezing\recurrent cough 11/21/16   Laverle Hobby, MD  albuterol (PROVENTIL) (2.5 MG/3ML) 0.083% nebulizer solution Take  3 mLs (2.5 mg total) by nebulization every 4 (four) hours as needed. 12/31/14   Bacigalupo, Dionne Bucy, MD  baclofen (LIORESAL) 10 MG tablet Take 1 tablet (10 mg total) by mouth 3 (three) times daily as needed (headaches). 02/17/16   Karamalegos, Devonne Doughty, DO  BIOTIN PO Take 2,000 mg by mouth daily.    [provider]  cetirizine (ZYRTEC) 10 MG tablet Take 1 tablet (10 mg total) by mouth daily. 11/22/16   Nicolette Bang, DO  diclofenac (VOLTAREN) 75 MG EC tablet Take 1 tablet (75 mg total) by mouth 2 (two) times daily with a meal. 05/28/17   Truett Mainland, DO  escitalopram (LEXAPRO) 20 MG tablet Take 1 tablet (20 mg total) by mouth daily. 02/14/17   Virginia Crews, MD  esomeprazole (NEXIUM) 40 MG  capsule TAKE 1 CAPSULE BY MOUTH ONCE DAILY 02/13/17   Virginia Crews, MD  estrogen, conjugated,-medroxyprogesterone (PREMPRO) 0.3-1.5 MG per tablet Take 1 tablet by mouth daily. 01/19/15   Anyanwu, Sallyanne Havers, MD  fluticasone (FLONASE) 50 MCG/ACT nasal spray PLACE 2 SPRAYS INTO THE NOSE DAILY. 04/15/14   Kuneff, Renee A, DO  furosemide (LASIX) 20 MG tablet TAKE 1 TABLET BY MOUTH DAILY. 11/21/16   Bacigalupo, Dionne Bucy, MD  gabapentin (NEURONTIN) 300 MG capsule Take 1 capsule (300 mg total) by mouth every 8 (eight) hours as needed. 06/06/17   Anyanwu, Sallyanne Havers, MD  losartan (COZAAR) 50 MG tablet TAKE 1 TABLET BY MOUTH ONCE DAILY 04/10/17   Bacigalupo, Dionne Bucy, MD  megestrol (MEGACE) 40 MG tablet Take 2 tablets (80 mg total) by mouth daily. 06/06/17   Anyanwu, Sallyanne Havers, MD  mometasone-formoterol (DULERA) 200-5 MCG/ACT AERO Inhale 2 puffs into the lungs 2 (two) times daily. 04/17/17   Laverle Hobby, MD  montelukast (SINGULAIR) 10 MG tablet Take 1 tablet (10 mg total) by mouth at bedtime. 12/30/15   Virginia Crews, MD  naproxen (NAPROSYN) 500 MG tablet Take 1 tablet (500 mg total) by mouth 2 (two) times daily with a meal. 04/10/17   Vivi Barrack, MD  ondansetron (ZOFRAN) 4 MG tablet Take 1 tablet (4 mg total) by mouth every 8 (eight) hours as needed for nausea or vomiting. 05/29/17   Rasch, Anderson Malta I, NP  oxybutynin (DITROPAN-XL) 10 MG 24 hr tablet Take 1 tablet (10 mg total) by mouth at bedtime. 12/30/15   Virginia Crews, MD  propranolol ER (INDERAL LA) 80 MG 24 hr capsule Take 1 capsule (80 mg total) by mouth daily. 12/30/15   Bacigalupo, Dionne Bucy, MD  Pyridoxine HCl (VITAMIN B-6 PO) Take 1 tablet by mouth daily. Unknown OTC strength    [provider]  simvastatin (ZOCOR) 40 MG tablet TAKE 1 TABLET BY MOUTH AT BEDTIME. 11/21/16   Bacigalupo, Dionne Bucy, MD  Spacer/Aero-Holding Chambers (AEROCHAMBER MV) inhaler Use as instructed 05/09/15   Vilinda Boehringer, MD  Tiotropium Bromide  Monohydrate (SPIRIVA RESPIMAT) 2.5 MCG/ACT AERS Inhale 2 puffs into the lungs daily. 11/22/16   Nicolette Bang, DO  traMADol (ULTRAM) 50 MG tablet Take 2 tablets (100 mg total) by mouth every 6 (six) hours as needed. 06/06/17   Anyanwu, Sallyanne Havers, MD  vitamin C (ASCORBIC ACID) 500 MG tablet Take 1,000 mg by mouth daily.    [provider]    Family History Family History  Problem Relation Age of Onset  . Diabetes Maternal Grandmother   . Anesthesia problems Neg Hx     Social History  Social History  Substance Use Topics  . Smoking status: Former Smoker    Packs/day: 0.25    Years: 15.00    Types: Cigarettes    Quit date: 02/16/2003  . Smokeless tobacco: Never Used  . Alcohol use Yes     Comment: socially     Allergies   Amitriptyline; Fluticasone-salmeterol; Penicillins; Ciprofloxacin; Macrobid [nitrofurantoin monohyd macro]; and Sulfamethoxazole-trimethoprim   Review of Systems Review of Systems  Constitutional: Negative for appetite change, chills and fever.  HENT: Negative for ear pain, rhinorrhea, sneezing and sore throat.   Eyes: Negative for photophobia and visual disturbance.  Respiratory: Negative for cough, chest tightness, shortness of breath and wheezing.   Cardiovascular: Negative for chest pain and palpitations.  Gastrointestinal: Positive for nausea. Negative for abdominal pain, blood in stool, constipation, diarrhea and vomiting.  Genitourinary: Negative for dysuria, hematuria and urgency.  Musculoskeletal: Negative for myalgias.  Skin: Negative for rash.  Neurological: Positive for dizziness and headaches. Negative for syncope, speech difficulty, weakness, light-headedness and numbness.     Physical Exam Updated Vital Signs BP 117/75 (BP Location: Right Arm)   Pulse 60   Temp 98 F (36.7 C) (Oral)   Resp 16   SpO2 98%   Physical Exam  Constitutional: She is oriented to person, place, and time. She appears well-developed and  well-nourished. No distress.  Nontoxic appearing and in no acute distress.  HENT:  Head: Normocephalic and atraumatic.  Nose: Nose normal.  Mouth/Throat: Uvula is midline.  Eyes: Pupils are equal, round, and reactive to light. Conjunctivae and EOM are normal. Right eye exhibits no discharge. Left eye exhibits no discharge. No scleral icterus.  Neck: Normal range of motion. Neck supple.  No meningismus  Cardiovascular: Normal rate, regular rhythm, normal heart sounds and intact distal pulses.  Exam reveals no gallop and no friction rub.   No murmur heard. Pulmonary/Chest: Effort normal and breath sounds normal. No respiratory distress.  Abdominal: Soft. Bowel sounds are normal. She exhibits no distension. There is no tenderness. There is no guarding.  Musculoskeletal: Normal range of motion. She exhibits no edema.  Neurological: She is alert and oriented to person, place, and time. No cranial nerve deficit or sensory deficit. She exhibits normal muscle tone. Coordination normal.  Pupils reactive. No facial asymmetry noted. Cranial nerves appear grossly intact. Sensation intact to light touch on face, BUE and BLE. Strength 5/5 in BUE and BLE. Equal grip strength bilaterally. Normal finger to nose coordination.  Skin: Skin is warm and dry. No rash noted.  Psychiatric: She has a normal mood and affect.  Nursing note and vitals reviewed.    ED Treatments / Results  Labs (all labs ordered are listed, but only abnormal results are displayed) Labs Reviewed - No data to display  EKG  EKG Interpretation None       Radiology No results found.  Procedures Procedures (including critical care time)  Medications Ordered in ED Medications  prochlorperazine (COMPAZINE) injection 10 mg (10 mg Intravenous Given 07/20/17 1003)  diphenhydrAMINE (BENADRYL) injection 12.5 mg (12.5 mg Intravenous Given 07/20/17 1007)  sodium chloride 0.9 % bolus 1,000 mL (1,000 mLs Intravenous New Bag/Given 07/20/17  1003)     Initial Impression / Assessment and Plan / ED Course  I have reviewed the triage vital signs and the nursing notes.  Pertinent labs & imaging results that were available during my care of the patient were reviewed by me and considered in my medical decision making (see chart for details).  Patient, with past medical history of migraines, presents to ED for evaluation of headache for the past 4 days. Reports similar symptoms to previous migraines. She denies any head injury, trauma, numbness, fever, neck pain. On physical exam she is nontoxic-appearing and in no acute distress. She has no focal deficits on neurological exam. Patient given Compazine, Benadryl and fluids. She reports resolution of symptoms with the above supportive measures. I have low suspicion for acute intracranial emergent condition positive for headache patient states that she has an appointment with her neurologist in about one week for further evaluation. Strict return precautions given.  Final Clinical Impressions(s) / ED Diagnoses   Final diagnoses:  Migraine without aura and without status migrainosus, not intractable    New Prescriptions New Prescriptions   No medications on file     Delia Heady, PA-C 07/20/17 1147    Drenda Freeze, MD 07/21/17 [phone]

## 2017-07-26 ENCOUNTER — Encounter: Payer: Self-pay | Admitting: Neurology

## 2017-07-26 ENCOUNTER — Ambulatory Visit (INDEPENDENT_AMBULATORY_CARE_PROVIDER_SITE_OTHER): Payer: 59 | Admitting: Neurology

## 2017-07-26 VITALS — BP 139/86 | HR 67 | Ht 64.0 in | Wt 193.0 lb

## 2017-07-26 DIAGNOSIS — M7918 Myalgia, other site: Secondary | ICD-10-CM

## 2017-07-26 DIAGNOSIS — M791 Myalgia: Secondary | ICD-10-CM

## 2017-07-26 DIAGNOSIS — M542 Cervicalgia: Secondary | ICD-10-CM

## 2017-07-26 DIAGNOSIS — R519 Headache, unspecified: Secondary | ICD-10-CM

## 2017-07-26 DIAGNOSIS — H532 Diplopia: Secondary | ICD-10-CM

## 2017-07-26 DIAGNOSIS — R51 Headache with orthostatic component, not elsewhere classified: Secondary | ICD-10-CM

## 2017-07-26 DIAGNOSIS — H05119 Granuloma of unspecified orbit: Secondary | ICD-10-CM

## 2017-07-26 DIAGNOSIS — H547 Unspecified visual loss: Secondary | ICD-10-CM

## 2017-07-26 DIAGNOSIS — G8929 Other chronic pain: Secondary | ICD-10-CM | POA: Diagnosis not present

## 2017-07-26 DIAGNOSIS — G43701 Chronic migraine without aura, not intractable, with status migrainosus: Secondary | ICD-10-CM | POA: Diagnosis not present

## 2017-07-26 MED ORDER — ELETRIPTAN HYDROBROMIDE 40 MG PO TABS: 40.0000 mg | ORAL_TABLET | ORAL | 11 refills | Status: DC | PRN

## 2017-07-26 MED ORDER — DICLOFENAC POTASSIUM 25 MG PO CAPS: 25.0000 mg | ORAL_CAPSULE | Freq: Once | ORAL | 4 refills | Status: AC

## 2017-07-26 MED FILL — ELETRIPTAN HBR 40 MG TABLET: 40 | 27 days supply | Qty: 10 | Fill #0

## 2017-07-26 NOTE — Progress Notes (Signed)
[facility]    Provider:  Dr Jaynee Eagles Referring Provider: Caroline More, DO Primary Care Physician:  Caroline More, DO  CC: Migraines  HPI:  [name] is a 51 y.o. female here as a referral from Dr. Tammi Klippel for migraines. Past medical history of migraines, high blood pressure, anxiety, depression, asthma.  Headaches wake her up in the morning. Migraines since the age of 34. Worse this year, she was taking care of her grandmother and passed away and there has been stress and a bad relationship. She has had migraine for days after getting a flu shot. [facility]. They start at the back of there head and spread all ove rthe head, she has tenderness of the skin, She has a prodrome feels the pain in the neck. For the last year. She has light and sound sensitivity, it is pulsating and throbbing and pounding with nausea and vomiting and also has vision changes and episodic vision loss unknown eye. Migraines can last weeks, has had one for weeks now, she was in the ED last Saturday, she is missing work. Sleeping in a dark room helps. Stress worsens it. Woke up with a headache again this morning and she has neck pain and neck tightness. Reports daily headaches and at least 15 are migrainous for the last 9 months. She has allergies as well. No medication overuse. No aura. No other focal neurologic deficits, associated symptoms, inciting events or modifiable factors. Lots of stress.   Medications tried include: Baclofen, Imitrex, Lexapro, gabapentin, losartan, naproxen, Zofran, propranolol, tramadol. She tried amitriptyline and has an allergy to it, zomig  Reviewed notes, labs and imaging from outside physicians, which showed:  presented the emergency room for evaluation of intermittent, generalized headache for the past 4 days on 07/20/2017. Patient had associated dizziness and feeling off balance with nausea. The headache began in the back of the  head and radiated to the front. Similar to previous migraines. She tried ibuprofen. The past she received several Botox injections with provided her with 1-2 years relief in her migraines. Denied any numbness, fevers, chills, headache injury, falls, blood thinner use. She was given Compazine, Benadryl and IV fluids in the emergency room. Symptoms reviewed and she was discharged.   Review of Systems: Patient complains of symptoms per HPI as well as the following symptoms: Shortness of breath, cough, wheezing, snoring, aching muscles, allergies, headache, dizziness, depression, anxiety, changes in appetite. Pertinent negatives and positives per HPI. All others negative.   Social History   Social History  . Marital status: Single    Spouse name: N/A  . Number of children: N/A  . Years of education: N/A   Occupational History  . Not on file.   Social History Main Topics  . Smoking status: Former Smoker    Packs/day: 0.25    Years: 15.00    Types: Cigarettes    Quit date: 02/16/2003  . Smokeless tobacco: Never Used  . Alcohol use Yes     Comment: socially  . Drug use: No  . Sexual activity: Not Currently    Partners: Male    Birth control/ protection: Surgical   Other Topics Concern  . Not on file   Social History Narrative   ** Merged History Encounter **        Family History  Problem Relation Age of Onset  . Diabetes Maternal Grandmother   . Anesthesia problems Neg Hx   . Headache Neg Hx   .  Aneurysm Neg Hx     Past Medical History:  Diagnosis Date  . Anemia   . Anxiety   . Asthma   . Depression   . Dysrhythmia    Irregular heat w/ asthma episodes and anxiety  . GERD (gastroesophageal reflux disease)   . Migraines   . PONV (postoperative nausea and vomiting)   . Sleep apnea    uses CPAP  . Urinary incontinence    frequent urination- tx w/ vesicare    Past Surgical History:  Procedure Laterality Date  . ABLATION    . CESAREAN SECTION     x3  . DILATION  AND CURETTAGE OF UTERUS  05/31/2005   MAB, TAB x 2  . HERNIA REPAIR  1990's  . HYSTEROSCOPY W/ ENDOMETRIAL ABLATION    . LEEP    . MAB     x 4  . MOUTH SURGERY     teeth extraction  . TUBAL LIGATION  11/01/2006  . WISDOM TOOTH EXTRACTION      Current Outpatient Prescriptions  Medication Sig Dispense Refill  . albuterol (PROVENTIL HFA;VENTOLIN HFA) 108 (90 Base) MCG/ACT inhaler 2 puffs every 3-4 hours as needed for shortness of breath\wheezing\recurrent cough 1 Inhaler 6  . albuterol (PROVENTIL) (2.5 MG/3ML) 0.083% nebulizer solution Take 3 mLs (2.5 mg total) by nebulization every 4 (four) hours as needed. 75 mL 6  . baclofen (LIORESAL) 10 MG tablet Take 1 tablet (10 mg total) by mouth 3 (three) times daily as needed (headaches). 60 each 1  . BIOTIN PO Take 2,000 mg by mouth daily.    . cetirizine (ZYRTEC) 10 MG tablet Take 1 tablet (10 mg total) by mouth daily. 30 tablet 11  . diclofenac (VOLTAREN) 75 MG EC tablet Take 1 tablet (75 mg total) by mouth 2 (two) times daily with a meal. 60 tablet 0  . escitalopram (LEXAPRO) 20 MG tablet Take 1 tablet (20 mg total) by mouth daily. 30 tablet 3  . esomeprazole (NEXIUM) 40 MG capsule TAKE 1 CAPSULE BY MOUTH ONCE DAILY 30 capsule 6  . estrogen, conjugated,-medroxyprogesterone (PREMPRO) 0.3-1.5 MG per tablet Take 1 tablet by mouth daily. 30 tablet 12  . fluticasone (FLONASE) 50 MCG/ACT nasal spray PLACE 2 SPRAYS INTO THE NOSE DAILY. 16 g 6  . furosemide (LASIX) 20 MG tablet TAKE 1 TABLET BY MOUTH DAILY. 30 tablet 3  . gabapentin (NEURONTIN) 300 MG capsule Take 1 capsule (300 mg total) by mouth every 8 (eight) hours as needed. 60 capsule 2  . losartan (COZAAR) 50 MG tablet TAKE 1 TABLET BY MOUTH ONCE DAILY 30 tablet 1  . megestrol (MEGACE) 40 MG tablet Take 2 tablets (80 mg total) by mouth daily. 60 tablet 2  . mometasone-formoterol (DULERA) 200-5 MCG/ACT AERO Inhale 2 puffs into the lungs 2 (two) times daily. 8.8 g 5  . montelukast (SINGULAIR) 10 MG  tablet Take 1 tablet (10 mg total) by mouth at bedtime. 90 tablet 3  . naproxen (NAPROSYN) 500 MG tablet Take 1 tablet (500 mg total) by mouth 2 (two) times daily with a meal. 60 tablet 0  . ondansetron (ZOFRAN) 4 MG tablet Take 1 tablet (4 mg total) by mouth every 8 (eight) hours as needed for nausea or vomiting. 6 tablet 0  . oxybutynin (DITROPAN-XL) 10 MG 24 hr tablet Take 1 tablet (10 mg total) by mouth at bedtime. 30 tablet 0  . propranolol ER (INDERAL LA) 80 MG 24 hr capsule Take 1 capsule (80 mg total) by  mouth daily. 30 capsule 2  . Pyridoxine HCl (VITAMIN B-6 PO) Take 1 tablet by mouth daily. Unknown OTC strength    . simvastatin (ZOCOR) 40 MG tablet TAKE 1 TABLET BY MOUTH AT BEDTIME. 90 tablet 3  . Spacer/Aero-Holding Chambers (AEROCHAMBER MV) inhaler Use as instructed 1 each 0  . SUMAtriptan (IMITREX) 50 MG tablet Take 50 mg by mouth every 2 (two) hours as needed for migraine. May repeat in 2 hours if headache persists or recurs.    . Tiotropium Bromide Monohydrate (SPIRIVA RESPIMAT) 2.5 MCG/ACT AERS Inhale 2 puffs into the lungs daily. 4 g 3  . traMADol (ULTRAM) 50 MG tablet Take 2 tablets (100 mg total) by mouth every 6 (six) hours as needed. 30 tablet 0  . vitamin C (ASCORBIC ACID) 500 MG tablet Take 1,000 mg by mouth daily.    . Diclofenac Potassium (ZIPSOR) 25 MG CAPS Take 1 capsule (25 mg total) by mouth once. 120 capsule 4  . eletriptan (RELPAX) 40 MG tablet Take 1 tablet (40 mg total) by mouth as needed for migraine or headache. May repeat in 2 hours if headache persists or recurs. 10 tablet 11   No current facility-administered medications for this visit.     Allergies as of 07/26/2017 - Review Complete 07/26/2017  Allergen Reaction Noted  . Amitriptyline  06/07/2013  . Fluticasone-salmeterol Other (See Comments) 05/11/2010  . Penicillins Itching and Swelling 07/17/2013  . Ciprofloxacin Itching and Rash 09/01/2012  . Macrobid [nitrofurantoin monohyd macro] Itching  09/01/2012  . Sulfamethoxazole-trimethoprim Rash     Vitals: BP 139/86   Pulse 67   Ht 5\' 4"  (1.626 m)   Wt 193 lb (87.5 kg)   BMI 33.13 kg/m  Last Weight:  Wt Readings from Last 1 Encounters:  07/26/17 193 lb (87.5 kg)   Last Height:   Ht Readings from Last 1 Encounters:  07/26/17 5\' 4"  (1.626 m)    Physical exam: Exam: Gen: NAD, conversant, well nourised, obese, well groomed                     CV: RRR, no MRG. No Carotid Bruits. No peripheral edema, warm, nontender Eyes: Conjunctivae clear without exudates or hemorrhage  Neuro: Detailed Neurologic Exam  Speech:    Speech is normal; fluent and spontaneous with normal comprehension.  Cognition:    The patient is oriented to person, place, and time;     recent and remote memory intact;     language fluent;     normal attention, concentration,     fund of knowledge Cranial Nerves:    The pupils are equal, round, and reactive to light.  Visual fields are full to finger confrontation. Extraocular movements are intact. Trigeminal sensation is intact and the muscles of mastication are normal. The face is symmetric. The palate elevates in the midline. Hearing intact. Voice is normal. Shoulder shrug is normal. The tongue has normal motion without fasciculations.   Coordination:    Normal finger to nose and heel to shin. Normal rapid alternating movements.   Gait:    Heel-toe and tandem gait are normal.   Motor Observation:    No asymmetry, no atrophy, and no involuntary movements noted. Tone:    Normal muscle tone.    Posture:    Posture is normal. normal erect    Strength:    Strength is V/V in the upper and lower limbs.      Sensation: intact to LT  Reflex Exam:  DTR's:    Deep tendon reflexes in the upper and lower extremities are normal bilaterally.   Toes:    The toes are downgoing bilaterally.   Clonus:    Clonus is absent.      Assessment/Plan:  51 year old with chronic migraines with status  migrainosus, positional headaches, vision loss, pain on eye movement, morning headaches even though she is treated effectively with CPAP, morbid obesity, myofascial cervical neck pain.   Imaging: Given the positional nature of the headaches.need MRI of the brain to evaluate for lesions, tumors, intracranial HTN, Also MRI orbits due to pain on eye movement to eval for orbital psudotumor.  Physical Therapy: Cervical myofascial pain, forward posture contributing to migraines and cervicalgia. Please evaluate and treat including dry needling, stretching, strengthening, manual therapy/massage, heating, TENS unit, exercising for scapular stabilization, pectoral stretching and rhomboid strengthening as clinically warranted as well as any other modality as recommended by evaluation.  Start Aimovig, discussed botox will try CGRP Ab initially  At onset of headache take Relpax (eletriptan) with his Zipsor may repeat once in 2 hours. No more than 2 times in one day or 2 days in one week.  Discussed: To prevent or relieve headaches, try the following: Cool Compress. Lie down and place a cool compress on your head.  Avoid headache triggers. If certain foods or odors seem to have triggered your migraines in the past, avoid them. A headache diary might help you identify triggers.  Include physical activity in your daily routine. Try a daily walk or other moderate aerobic exercise.  Manage stress. Find healthy ways to cope with the stressors, such as delegating tasks on your to-do list.  Practice relaxation techniques. Try deep breathing, yoga, massage and visualization.  Eat regularly. Eating regularly scheduled meals and maintaining a healthy diet might help prevent headaches. Also, drink plenty of fluids.  Follow a regular sleep schedule. Sleep deprivation might contribute to headaches Consider biofeedback. With this mind-body technique, you learn to control certain bodily functions - such as muscle tension,  heart rate and blood pressure - to prevent headaches or reduce headache pain.    Proceed to emergency room if you experience new or worsening symptoms or symptoms do not resolve, if you have new neurologic symptoms or if headache is severe, or for any concerning symptom.   Provided education and documentation from [employer] toolbox including articles on: chronic migraine medication overuse headache, chronic migraines, prevention of migraines, behavioral and other nonpharmacologic treatments for headache.  Orders Placed This Encounter  Procedures  . MR BRAIN W WO CONTRAST  . MR ORBITS W WO CONTRAST  . Comprehensive metabolic panel  . Ambulatory referral to Physical Therapy      Sarina Ill, MD  [facility] [address] [place], [place] [address]  Phone [phone] Fax [phone]

## 2017-07-26 NOTE — Patient Instructions (Addendum)
MRI of the brain   Labs  Physical Therapy  Start Aimovig monthly, provided literature and side effects  At onset of headache take Relpax (eletriptan) with his Zipsor may repeat once in 2 hours. No more than 2 times in one day or 2 days in one week.  To prevent or relieve headaches, try the following: Cool Compress. Lie down and place a cool compress on your head.  Avoid headache triggers. If certain foods or odors seem to have triggered your migraines in the past, avoid them. A headache diary might help you identify triggers.  Include physical activity in your daily routine. Try a daily walk or other moderate aerobic exercise.  Manage stress. Find healthy ways to cope with the stressors, such as delegating tasks on your to-do list.  Practice relaxation techniques. Try deep breathing, yoga, massage and visualization.  Eat regularly. Eating regularly scheduled meals and maintaining a healthy diet might help prevent headaches. Also, drink plenty of fluids.  Follow a regular sleep schedule. Sleep deprivation might contribute to headaches Consider biofeedback. With this mind-body technique, you learn to control certain bodily functions - such as muscle tension, heart rate and blood pressure - to prevent headaches or reduce headache pain.    Proceed to emergency room if you experience new or worsening symptoms or symptoms do not resolve, if you have new neurologic symptoms or if headache is severe, or for any concerning symptom.   Provided education and documentation from American headache Society toolbox including articles on: chronic migraine medication overuse headache, chronic migraines, prevention of migraines, behavioral and other nonpharmacologic treatments for headache.    Migraine Headache A migraine headache is an intense, throbbing pain on one side or both sides of the head. Migraines may also cause other symptoms, such as nausea, vomiting, and sensitivity to light and noise. What  are the causes? Doing or taking certain things may also trigger migraines, such as:  Alcohol.  Smoking.  Medicines, such as: ? Medicine used to treat chest pain (nitroglycerine). ? Birth control pills. ? Estrogen pills. ? Certain blood pressure medicines.  Aged cheeses, chocolate, or caffeine.  Foods or drinks that contain nitrates, glutamate, aspartame, or tyramine.  Physical activity.  Other things that may trigger a migraine include:  Menstruation.  Pregnancy.  Hunger.  Stress, lack of sleep, too much sleep, or fatigue.  Weather changes.  What increases the risk? The following factors may make you more likely to experience migraine headaches:  Age. Risk increases with age.  Family history of migraine headaches.  Being Caucasian.  Depression and anxiety.  Obesity.  Being a woman.  Having a hole in the heart (patent foramen ovale) or other heart problems.  What are the signs or symptoms? The main symptom of this condition is pulsating or throbbing pain. Pain may:  Happen in any area of the head, such as on one side or both sides.  Interfere with daily activities.  Get worse with physical activity.  Get worse with exposure to bright lights or loud noises.  Other symptoms may include:  Nausea.  Vomiting.  Dizziness.  General sensitivity to bright lights, loud noises, or smells.  Before you get a migraine, you may get warning signs that a migraine is developing (aura). An aura may include:  Seeing flashing lights or having blind spots.  Seeing bright spots, halos, or zigzag lines.  Having tunnel vision or blurred vision.  Having numbness or a tingling feeling.  Having trouble talking.  Having muscle weakness.  How is this diagnosed? A migraine headache can be diagnosed based on:  Your symptoms.  A physical exam.  Tests, such as CT scan or MRI of the head. These imaging tests can help rule out other causes of headaches.  Taking  fluid from the spine (lumbar puncture) and analyzing it (cerebrospinal fluid analysis, or CSF analysis).  How is this treated? A migraine headache is usually treated with medicines that:  Relieve pain.  Relieve nausea.  Prevent migraines from coming back.  Treatment may also include:  Acupuncture.  Lifestyle changes like avoiding foods that trigger migraines.  Follow these instructions at home: Medicines  Take over-the-counter and prescription medicines only as told by your health care provider.  Do not drive or use heavy machinery while taking prescription pain medicine.  To prevent or treat constipation while you are taking prescription pain medicine, your health care provider may recommend that you: ? Drink enough fluid to keep your urine clear or pale yellow. ? Take over-the-counter or prescription medicines. ? Eat foods that are high in fiber, such as fresh fruits and vegetables, whole grains, and beans. ? Limit foods that are high in fat and processed sugars, such as fried and sweet foods. Lifestyle  Avoid alcohol use.  Do not use any products that contain nicotine or tobacco, such as cigarettes and e-cigarettes. If you need help quitting, ask your health care provider.  Get at least 8 hours of sleep every night.  Limit your stress. General instructions   Keep a journal to find out what may trigger your migraine headaches. For example, write down: ? What you eat and drink. ? How much sleep you get. ? Any change to your diet or medicines.  If you have a migraine: ? Avoid things that make your symptoms worse, such as bright lights. ? It may help to lie down in a dark, quiet room. ? Do not drive or use heavy machinery. ? Ask your health care provider what activities are safe for you while you are experiencing symptoms.  Keep all follow-up visits as told by your health care provider. This is important. Contact a health care provider if:  You develop symptoms  that are different or more severe than your usual migraine symptoms. Get help right away if:  Your migraine becomes severe.  You have a fever.  You have a stiff neck.  You have vision loss.  Your muscles feel weak or like you cannot control them.  You start to lose your balance often.  You develop trouble walking.  You faint. This information is not intended to replace advice given to you by your health care provider. Make sure you discuss any questions you have with your health care provider. Document Released: 10/15/2005 Document Revised: 05/04/2016 Document Reviewed: 04/02/2016 Elsevier Interactive Patient Education  2017 [employer].   Diclofenac immediate-release capsules What is this medicine? DICLOFENAC (dye KLOE fen ak) is a non-steroidal anti-inflammatory drug (NSAID). It is used to reduce swelling and to treat pain. It may be used to treat osteoarthritis, rheumatoid arthritis, mild to moderate pain, and painful monthly periods. This medicine may be used for other purposes; ask your health care provider or pharmacist if you have questions. COMMON BRAND NAME(S): Zipsor, Zorvolex What should I tell my health care provider before I take this medicine? They need to know if you have any of these conditions: -asthma, especially aspirin sensitive asthma -coronary artery bypass graft (CABG) surgery within the past 2 weeks -drink more than 3  alcohol containing drinks a day -heart disease or circulation problems like heart failure or leg edema (fluid retention) -high blood pressure -kidney disease -liver disease -stomach bleeding or ulcers -an unusual or allergic reaction to diclofenac, aspirin, other NSAIDs, bovine or cow proteins, other medicines, foods, dyes, or preservatives -pregnant or trying to get pregnant -breast-feeding How should I use this medicine? Take this medicine by mouth with a full glass of water. Follow the directions on the prescription label. Take your  medicine at regular intervals. Do not take your medicine more often than directed. Long-term, continuous use may increase the risk of heart attack or stroke. A special MedGuide will be given to you by the pharmacist with each prescription and refill. Be sure to read this information carefully each time. Talk to your pediatrician regarding the use of this medicine in children. Special care may be needed. Elderly patients over 15 years old may have a stronger reaction and need a smaller dose. Overdosage: If you think you have taken too much of this medicine contact a poison control center or emergency room at once. NOTE: This medicine is only for you. Do not share this medicine with others. What if I miss a dose? If you miss a dose, take it as soon as you can. If it is almost time for your next dose, take only that dose. Do not take double or extra doses. What may interact with this medicine? Do not take this medicine with any of the following medications: -cidofovir -ketorolac -methotrexate This medicine may also interact with the following medications: -alcohol -aspirin and aspirin-like medicines -certain medicines for blood pressure -certain medicines for depression, like fluoxetine, sertraline -certain medicines that treat or prevent blood clots like warfarin, enoxaparin, dalteparin, apixaban, dabigatran, and rivaroxaban -cyclosporine -digoxin -diuretics -lithium -medicines for osteoporosis -NSAIDs, medicines for pain and inflammation, like ibuprofen or naproxen -pemetrexed -rifampin -steroid medicines like prednisone or cortisone -voriconazole This list may not describe all possible interactions. Give your health care provider a list of all the medicines, herbs, non-prescription drugs, or dietary supplements you use. Also tell them if you smoke, drink alcohol, or use illegal drugs. Some items may interact with your medicine. What should I watch for while using this medicine? Tell  your doctor or health care professional if your pain does not get better. Talk to your doctor before taking another medicine for pain. Do not treat yourself. This medicine does not prevent heart attack or stroke. In fact, this medicine may increase the chance of a heart attack or stroke. The chance may increase with longer use of this medicine and in people who have heart disease. If you take aspirin to prevent heart attack or stroke, talk with your doctor or health care professional. Do not take medicines such as ibuprofen and naproxen with this medicine. Side effects such as stomach upset, nausea, or ulcers may be more likely to occur. Many medicines available without a prescription should not be taken with this medicine. This medicine can cause ulcers and bleeding in the stomach and intestines at any time during treatment. Do not smoke cigarettes or drink alcohol. These increase irritation to your stomach and can make it more susceptible to damage from this medicine. Ulcers and bleeding can happen without warning symptoms and can cause death. You may get drowsy or dizzy. Do not drive, use machinery, or do anything that needs mental alertness until you know how this medicine affects you. Do not stand or sit up quickly, especially if you are  an older patient. This reduces the risk of dizzy or fainting spells. This medicine can cause you to bleed more easily. Try to avoid damage to your teeth and gums when you brush or floss your teeth. In females, this medicine may temporarily make it more difficult to get pregnant. Talk with your doctor or health care professional if you are concerned about your fertility. What side effects may I notice from receiving this medicine? Side effects that you should report to your doctor or health care professional as soon as possible: -allergic reactions like skin rash, itching or hives, swelling of the face, lips, or tongue -chest pain or chest tightness -difficulty  breathing or wheezing -redness, blistering, peeling or loosening of the skin, including inside the mouth -signs and symptoms of bleeding such as bloody or black, tarry stools; red or dark brown urine; spitting up blood or brown material that looks like coffee grounds; red spots on the skin; unusual bruising or bleeding from the eye, gums, or nose -signs and symptoms of liver injury like dark yellow or brown urine; general ill feeling or flu-like symptoms; light-colored stools; loss of appetite; nausea; right upper belly pain; unusually weak or tired; yellowing of the eyes or skin -signs and symptoms of kidney injury like trouble passing urine or change in the amount of urine -signs and symptoms of a stroke like changes in vision; confusion; trouble speaking or understanding; severe headaches; sudden numbness or weakness of the face, arm or leg; trouble walking; dizziness; loss of balance or coordination -unexplained weight gain or swelling Side effects that usually do not require medical attention (report to your doctor or health care professional if they continue or are bothersome): -constipation -diarrhea -dizziness -headache -heartburn This list may not describe all possible side effects. Call your doctor for medical advice about side effects. You may report side effects to FDA at 1-800-FDA-1088. Where should I keep my medicine? Keep out of the reach of children. Store at room temperature between 15 and 30 degrees C (59 and 86 degrees F). Protect from moisture. Throw away any unused medicine after the expiration date. NOTE: This sheet is a summary. It may not cover all possible information. If you have questions about this medicine, talk to your doctor, pharmacist, or health care provider.  2018 Elsevier/Gold Standard (2015-11-17 12:47:39) Eletriptan tablets What is this medicine? ELETRIPTAN (el ih TRIP tan) is used to treat migraines with or without aura. An aura is a strange feeling or  visual disturbance that warns you of an attack. It is not used to prevent migraines. This medicine may be used for other purposes; ask your health care provider or pharmacist if you have questions. COMMON BRAND NAME(S): Relpax What should I tell my health care provider before I take this medicine? They need to know if you have any of these conditions: -bowel disease or colitis -diabetes -family history of heart disease -fast or irregular heart beat -heart or blood vessel disease, angina (chest pain), or previous heart attack -high blood pressure -high cholesterol -history of stroke, transient ischemic attacks (TIAs or mini-strokes), or intracranial bleeding -kidney or liver disease -overweight -poor circulation -postmenopausal or surgical removal of uterus and ovaries -Raynaud's disease -seizure disorder -an unusual or allergic reaction to eletriptan, other medicines, foods, dyes, or preservatives -pregnant or trying to get pregnant -breast-feeding How should I use this medicine? Take this medicine by mouth with a glass of water. Follow the directions on the prescription label. This medicine is taken at the first  symptoms of a migraine. It is not for everyday use. If your migraine headache returns after one dose, you can take another dose as directed. You must leave at least 2 hours between doses, and do not take more than 40 mg as a single dose. Do not take more than 80 mg total in any 24 hour period. If there is no improvement at all after the first dose, do not take a second dose without talking to your doctor or health care professional. Do not take your medicine more often than directed. Talk to your pediatrician regarding the use of this medicine in children. Special care may be needed. Overdosage: If you think you have taken too much of this medicine contact a poison control center or emergency room at once. NOTE: This medicine is only for you. Do not share this medicine with  others. What if I miss a dose? This does not apply; this medicine is not for regular use. What may interact with this medicine? Do not take this medicine with any of the following medications: -antiviral medicines for HIV or AIDS -certain antibiotics like clarithromycin, erythromycin, telithromycin -cimetidine -conivaptan -dalfopristin; quinupristin -diltiazem -ergot alkaloids like dihydroergotamine, ergonovine, ergotamine, methylergonovine -fluvoxamine -idelalisib -imatinib -medicines for fungal infections like fluconazole, itraconazole, ketoconazole, and voriconazole -mifepristone -nefazodone -stimulant medicines for attention disorders, weight loss, or to stay awake -verapamil -zafirlukast This medicine may also interact with the following medications: -certain medicines for depression, anxiety, or psychotic disturbances This list may not describe all possible interactions. Give your health care provider a list of all the medicines, herbs, non-prescription drugs, or dietary supplements you use. Also tell them if you smoke, drink alcohol, or use illegal drugs. Some items may interact with your medicine. What should I watch for while using this medicine? Only take this medicine for a migraine headache. Take it if you get warning symptoms or at the start of a migraine attack. It is not for regular use to prevent migraine attacks. You may get drowsy or dizzy. Do not drive, use machinery, or do anything that needs mental alertness until you know how this medicine affects you. To reduce dizzy or fainting spells, do not sit or stand up quickly, especially if you are an older patient. Alcohol can increase drowsiness, dizziness and flushing. Avoid alcoholic drinks. Smoking cigarettes may increase the risk of heart-related side effects from using this medicine. If you take migraine medicines for 10 or more days a month, your migraines may get worse. Keep a diary of headache days and medicine  use. Contact your healthcare professional if your migraine attacks occur more frequently. What side effects may I notice from receiving this medicine? Side effects that you should report to your doctor or health care professional as soon as possible: -allergic reactions like skin rash, itching or hives, swelling of the face, lips, or tongue -fast, slow, or irregular heart beat -increased or decreased blood pressure -seizures -severe stomach pain and cramping, bloody diarrhea -signs and symptoms of a blood clot such as breathing problems; changes in vision; chest pain; severe, sudden headache; pain, swelling, warmth in the leg; trouble speaking; sudden numbness or weakness of the face, arm or leg -tingling, pain, or numbness in the face, hands, or feet Side effects that usually do not require medical attention (report to your doctor or health care professional if they continue or are bothersome): -drowsiness -feeling warm, flushing, or redness of the face -headache -muscle cramps, pain -nausea, vomiting -unusually weak or tired This list may  not describe all possible side effects. Call your doctor for medical advice about side effects. You may report side effects to FDA at 1-800-FDA-1088. Where should I keep my medicine? Keep out of the reach of children. Store at room temperature between 15 and 30 degrees C (59 and 86 degrees F). Throw away any unused medicine after the expiration date. NOTE: This sheet is a summary. It may not cover all possible information. If you have questions about this medicine, talk to your doctor, pharmacist, or health care provider.  2018 Elsevier/Gold Standard (2015-10-13 11:20:42)

## 2017-07-27 LAB — COMPREHENSIVE METABOLIC PANEL
ALT: 15 IU/L (ref 0–32)
AST: 16 IU/L (ref 0–40)
Albumin/Globulin Ratio: 1.4 (ref 1.2–2.2)
Albumin: 4.3 g/dL (ref 3.5–5.5)
Alkaline Phosphatase: 42 IU/L (ref 39–117)
BUN/Creatinine Ratio: 15 (ref 9–23)
BUN: 13 mg/dL (ref 6–24)
Bilirubin Total: 0.4 mg/dL (ref 0.0–1.2)
CO2: 24 mmol/L (ref 20–29)
CREATININE: 0.87 mg/dL (ref 0.57–1.00)
Calcium: 9.2 mg/dL (ref 8.7–10.2)
Chloride: 104 mmol/L (ref 96–106)
GFR, EST AFRICAN AMERICAN: 90 mL/min/{1.73_m2} (ref >59)
GFR, EST NON AFRICAN AMERICAN: 78 mL/min/{1.73_m2} (ref >59)
GLUCOSE: 133 mg/dL — AB (ref 65–99)
Globulin, Total: 3.1 g/dL (ref 1.5–4.5)
Potassium: 4.5 mmol/L (ref 3.5–5.2)
Sodium: 142 mmol/L (ref 134–144)
TOTAL PROTEIN: 7.4 g/dL (ref 6.0–8.5)

## 2017-07-30 ENCOUNTER — Telehealth: Payer: Self-pay | Admitting: Family Medicine

## 2017-07-30 NOTE — Telephone Encounter (Signed)
Called patient to discuss discrepancy regarding FMLA absence. I received a fax stating that patient had gone outside of frequency and duration allocated for FMLA leave. Patient stated that she is currently seeing a neurologist for her migraine headaches and neurologist will be filling out a new FMLA form on 08/10/17.  Dalphine Handing, PGY-1 [facility] 07/30/2017 4:49 PM

## 2017-07-31 ENCOUNTER — Telehealth: Payer: Self-pay | Admitting: *Deleted

## 2017-07-31 NOTE — Telephone Encounter (Signed)
Received call back form patient asking what the previous call was about. This RN repeated message. Patient asked for specific blood sugar reading, gave her those values. She then stated her rehab starts next week. She asked about Aimovig, stated she has not heard about it. She received aimovig injection in office last week. This RN advised it may be a little longer before she hears. Advised she can call the number on her information. Advised it will be a month before she can receive injection again.  She stated she will call, verbalized understanding, appreciation.

## 2017-07-31 NOTE — Telephone Encounter (Signed)
LVM informing patient her labs are normal except for elevated glucose. Advised her the glucose is less than it was in lab done 10 months ago. Left number for any questions.

## 2017-08-07 ENCOUNTER — Ambulatory Visit: Payer: 59 | Attending: Neurology | Admitting: Physical Therapy

## 2017-08-07 ENCOUNTER — Encounter: Payer: Self-pay | Admitting: Physical Therapy

## 2017-08-07 DIAGNOSIS — R51 Headache: Secondary | ICD-10-CM | POA: Diagnosis not present

## 2017-08-07 DIAGNOSIS — R293 Abnormal posture: Secondary | ICD-10-CM

## 2017-08-07 DIAGNOSIS — M542 Cervicalgia: Secondary | ICD-10-CM

## 2017-08-07 DIAGNOSIS — G8929 Other chronic pain: Secondary | ICD-10-CM

## 2017-08-07 DIAGNOSIS — M62838 Other muscle spasm: Secondary | ICD-10-CM | POA: Diagnosis not present

## 2017-08-07 DIAGNOSIS — R519 Headache, unspecified: Secondary | ICD-10-CM

## 2017-08-07 NOTE — Therapy (Signed)
[place], [place], [address] Phone: [phone]   Fax:  [phone]  Physical Therapy Evaluation  Patient Details  Name: [name] MRN: [id number] Date of Birth: August 02, 1966 Referring Provider: Melvenia Beam, MD  Encounter Date: 08/07/2017      PT End of Session - 08/07/17 1024    Visit Number 1   Number of Visits 13   Date for PT Re-Evaluation 09/18/17   PT Start Time 0931   PT Stop Time 1025   PT Time Calculation (min) 54 min   Activity Tolerance Patient tolerated treatment well   Behavior During Therapy [facility] for tasks assessed/performed      Past Medical History:  Diagnosis Date  . Anemia   . Anxiety   . Asthma   . COPD (chronic obstructive pulmonary disease) ([place])   . Depression   . Dysrhythmia    Irregular heat w/ asthma episodes and anxiety  . GERD (gastroesophageal reflux disease)   . Migraines   . PONV (postoperative nausea and vomiting)   . Sleep apnea    uses CPAP  . Urinary incontinence    frequent urination- tx w/ vesicare    Past Surgical History:  Procedure Laterality Date  . ABLATION    . CESAREAN SECTION     x3  . DILATION AND CURETTAGE OF UTERUS  05/31/2005   MAB, TAB x 2  . HERNIA REPAIR  1990's  . HYSTEROSCOPY W/ ENDOMETRIAL ABLATION    . LEEP    . MAB     x 4  . MOUTH SURGERY     teeth extraction  . TUBAL LIGATION  11/01/2006  . WISDOM TOOTH EXTRACTION      There were no vitals filed for this visit.       Subjective Assessment - 08/07/17 0934    Subjective pt is a 51 y.o F with CC of neck pain with intractable HA that has been going on since pt was 51 y.o. Reported since onset they get worse. pain started in the back of the middle neck and radiates up to the head. unsure of what causes, denies any N/T but reports occasional diplopia with serious migraine.    How long can you sit comfortably? unlimited   How long can you stand comfortably? unlimited    How long can you walk comfortably? depends on speed of walking and issues with breathing   Diagnostic tests N/A   Patient Stated Goals to get the neck pain to go away, decrease tightness,    Currently in Pain? Yes   Pain Score 6   at worst 10/10   Pain Location Neck   Pain Orientation Posterior;Mid   Pain Descriptors / Indicators Tightness;Aching;Sharp;Sore   Pain Type Chronic pain   Pain Radiating Towards to to the top of the head   Pain Onset More than a month ago   Pain Frequency Intermittent   Aggravating Factors  neck pain, general neck motions.    Pain Relieving Factors compress, ibuprofen,             [facility] PT Assessment - 08/07/17 0931      Assessment   Medical Diagnosis chronic Headache, cervical pain, cervical myofascial pain syndrome   Referring Provider Melvenia Beam, MD   Onset Date/Surgical Date --  since being 51 y.o   Hand Dominance Right   Next MD Visit 09/10/2017   Prior Therapy yes     Precautions   Precautions  None     Restrictions   Weight Bearing Restrictions No     Balance Screen   Has the patient fallen in the past 6 months No   Has the patient had a decrease in activity level because of a fear of falling?  No   Is the patient reluctant to leave their home because of a fear of falling?  No     Home Environment   Living Environment Private residence   Living Arrangements Spouse/significant other   Type of [place] to enter   Entrance Stairs-Number of Steps 4   [place] One level     Prior Function   Level of Independence Independent   Vocation Full time employment  Cytogeneticist, carrying, pushing/ pulling, and squating   Leisure cooking, entertaining,      Cognition   Overall Cognitive Status Within Functional Limits for tasks assessed     Observation/Other Assessments   Focus on Therapeutic Outcomes (FOTO)  50% limited  predicted 38% limited      Posture/Postural Control   Posture/Postural Control Postural limitations   Postural Limitations Rounded Shoulders;Forward head     ROM / Strength   AROM / PROM / Strength AROM;Strength     AROM   Overall AROM Comments signifcantly guarded during motion with limited effort provided    AROM Assessment Site Cervical   Cervical Flexion 10  tightness noted during movement   Cervical Extension 10  tightness   Cervical - Right Side Bend 8   Cervical - Left Side Bend 8   Cervical - Right Rotation 37   Cervical - Left Rotation 36     Palpation   Palpation comment TTP bil upper trap/ levator, cerivcothoracic paraspinals and sub-occipitals            Objective measurements completed on examination: See above findings.          [facility] Adult PT Treatment/Exercise - 08/07/17 0931      Modalities   Modalities Moist Heat     Moist Heat Therapy   Number Minutes Moist Heat 10 Minutes   Moist Heat Location Cervical     Manual Therapy   Manual Therapy Soft tissue mobilization   Soft tissue mobilization IASTM over bil sub-occipitals and bil upper trap          Trigger Point Dry Needling - 08/07/17 1209    Consent Given? Yes   Education Handout Provided Yes   Muscles Treated Upper Body Upper trapezius;Suboccipitals muscle group   Upper Trapezius Response Twitch reponse elicited;Palpable increased muscle length  bil   SubOccipitals Response Twitch response elicited;Palpable increased muscle length              PT Education - 08/07/17 1005    Education provided Yes   Education Details evaluation findings, POC, goals, HEP with form/ rationale, muscle anatomy and referral patterns. What TPDN is, benefits/ what to expect and after care.   Person(s) Educated Patient   Methods Explanation;Handout;Verbal cues   Comprehension Verbalized understanding;Verbal cues required          PT Short Term Goals - 08/07/17 1203      PT SHORT TERM GOAL #1   Title pt to be I  with inital HEP    Time 3   Period Weeks   Status New   Target Date 08/28/17     PT SHORT TERM GOAL #2  Title pt to verbalize/ demo proper posture with sitting/ standing and lifting/ carrying mechanics to prevent/ reduce neck pain    Time 3   Period Weeks   Status New   Target Date 08/28/17     PT SHORT TERM GOAL #3   Title Reduce cervical spasm to promote neck mobility and reduce pain to </= 6/10    Time 3   Period Weeks   Status New   Target Date 08/28/17           PT Long Term Goals - 08/07/17 1204      PT LONG TERM GOAL #1   Title increase cervical flexion and bil rotation to >/= 50 degrees, extension / bil side bending to >/= 18 degrees to promote functional moblity for driving and ADLs    Time 6   Period Weeks   Status New   Target Date 09/18/17     PT LONG TERM GOAL #2   Title pt to report no migraines for >/= 1 week to demo improvement in condition    Time 6   Period Weeks   Status New   Target Date 09/18/17     PT LONG TERM GOAL #3   Title increase FOTO score to >/= 38% limited to demo improvement in function    Time 6   Period Weeks   Status New   Target Date 09/18/17     PT LONG TERM GOAL #4   Title pt to be I with all HEP given as of last visit    Time 6   Period Weeks   Status New   Target Date 09/18/17                Plan - 08/07/17 1024    Clinical Impression Statement pt presents to OPPT with CC of neck pain with report of Migraines that has been going on since she was 15. limited evaluation due to pain and significant guarding with cervical mobility. tightness with spasm noted in the bil upper trap/ levator scapule, and sub-occipitals. DN was explained and performed over the sub-occipitals and bil upper traps followed with soft tissue techniques. She would benefit from physical therapy to decrease muscle tightness, improve cervical mobility, reduce incidence of migraines and maximize her function by addressing the deficits listed.     History and Personal Factors relevant to plan of care: hx of depression, migraines, anxiety   Clinical Presentation Evolving   Clinical Presentation due to: significant guarding with all cervical motions, spasm, abnormal posture   Clinical Decision Making Moderate   Rehab Potential Good   PT Frequency 2x / week   PT Duration 6 weeks   PT Treatment/Interventions ADLs/Self Care Home Management;Cryotherapy;Electrical Stimulation;Iontophoresis 4mg /ml Dexamethasone;Moist Heat;Ultrasound;Therapeutic activities;Traction;Therapeutic exercise;Dry needling;Taping;Manual techniques;Patient/family education;Neuromuscular re-education   PT Next Visit Plan review and update HEP PRN, response to DN, soft tissue/ cervical mobs, thoracic mobility exercise/ scapular stability, modalities PRN   PT Home Exercise Plan chin tuck, upper trap / levator stretching Rows.    Consulted and Agree with Plan of Care Patient      Patient will benefit from skilled therapeutic intervention in order to improve the following deficits and impairments:  Pain, Decreased endurance, Increased muscle spasms, Increased fascial restricitons, Decreased mobility, Postural dysfunction, Improper body mechanics, Decreased range of motion, Decreased activity tolerance  Visit Diagnosis: Chronic intractable headache, unspecified headache type - Plan: PT plan of care cert/re-cert  Cervicalgia - Plan: PT plan of care cert/re-cert  Other muscle  spasm - Plan: PT plan of care cert/re-cert  Abnormal posture - Plan: PT plan of care cert/re-cert     Problem List Patient Active Problem List   Diagnosis Date Noted  . Chronic migraine without aura with status migrainosus, not intractable 07/26/2017  . Sinusitis 02/25/2017  . Left knee pain 08/29/2016  . Bunion of great toe of right foot 08/29/2016  . Preoperative cardiovascular examination 08/01/2016  . Abdominal cramping 12/30/2015  . Bladder spasms 12/30/2015  . Pre-diabetes 10/03/2015   . HTN (hypertension) 10/03/2015  . Fatigue 07/21/2015  . Urinary urgency 07/06/2015  . Flank pain 07/06/2015  . Hot flashes 10/14/2014  . Vitamin D deficiency 11/25/2013  . Cough 09/10/2013  . Back pain 06/30/2013  . Migraine 06/07/2013  . Knee pain, bilateral 10/09/2012  . Obstructive sleep apnea 09/08/2010  . HLD (hyperlipidemia) 09/20/2008  . Depression with anxiety 12/26/2006  . RHINITIS, ALLERGIC 12/26/2006  . Asthma 12/26/2006   Starr Lake PT, DPT, LAT, ATC  08/07/17  12:11 PM      [facility] [address] [place], [place], [address] Phone: [phone]   Fax:  [phone]  Name: TROYCE FEBO MRN: [id number] Date of Birth: 1965/12/27

## 2017-08-08 ENCOUNTER — Encounter (HOSPITAL_COMMUNITY)
Admission: RE | Admit: 2017-08-08 | Discharge: 2017-08-08 | Disposition: A | Discharge status: Home / Self Care | Payer: 59 | Source: Ambulatory Visit | Attending: Obstetrics & Gynecology | Admitting: Obstetrics & Gynecology

## 2017-08-08 ENCOUNTER — Encounter (HOSPITAL_COMMUNITY): Payer: Self-pay

## 2017-08-08 DIAGNOSIS — K219 Gastro-esophageal reflux disease without esophagitis: Secondary | ICD-10-CM | POA: Insufficient documentation

## 2017-08-08 DIAGNOSIS — F329 Major depressive disorder, single episode, unspecified: Secondary | ICD-10-CM | POA: Insufficient documentation

## 2017-08-08 DIAGNOSIS — Z01812 Encounter for preprocedural laboratory examination: Secondary | ICD-10-CM | POA: Diagnosis not present

## 2017-08-08 DIAGNOSIS — I10 Essential (primary) hypertension: Secondary | ICD-10-CM | POA: Insufficient documentation

## 2017-08-08 DIAGNOSIS — F419 Anxiety disorder, unspecified: Secondary | ICD-10-CM | POA: Diagnosis not present

## 2017-08-08 DIAGNOSIS — I739 Peripheral vascular disease, unspecified: Secondary | ICD-10-CM | POA: Diagnosis not present

## 2017-08-08 DIAGNOSIS — J449 Chronic obstructive pulmonary disease, unspecified: Secondary | ICD-10-CM | POA: Diagnosis not present

## 2017-08-08 DIAGNOSIS — J45909 Unspecified asthma, uncomplicated: Secondary | ICD-10-CM | POA: Insufficient documentation

## 2017-08-08 DIAGNOSIS — Z87891 Personal history of nicotine dependence: Secondary | ICD-10-CM | POA: Diagnosis not present

## 2017-08-08 DIAGNOSIS — D649 Anemia, unspecified: Secondary | ICD-10-CM | POA: Insufficient documentation

## 2017-08-08 HISTORY — DX: Prediabetes: R73.03

## 2017-08-08 HISTORY — DX: Essential (primary) hypertension: I10

## 2017-08-08 HISTORY — DX: Urgency of urination: R39.15

## 2017-08-08 LAB — CBC
HCT: 37.3 % (ref 36.0–46.0)
HEMOGLOBIN: 12.5 g/dL (ref 12.0–15.0)
MCH: 31.1 pg (ref 26.0–34.0)
MCHC: 33.5 g/dL (ref 30.0–36.0)
MCV: 92.8 fL (ref 78.0–100.0)
Platelets: 259 10*3/uL (ref 150–400)
RBC: 4.02 MIL/uL (ref 3.87–5.11)
RDW: 13.2 % (ref 11.5–15.5)
WBC: 5.6 10*3/uL (ref 4.0–10.5)

## 2017-08-08 LAB — ABO/RH: ABO/RH(D): AB POS

## 2017-08-08 NOTE — Patient Instructions (Addendum)
Your procedure is scheduled on:  Thursday, Oct. 18, 2018  Enter through the [employer] [facility] at:  11:30 AM  Pick up the phone at the desk and dial [phone].  Call this number if you have problems the morning of surgery: [phone].  Remember: Do NOT eat food:  After Midnight Wednesday  Do NOT drink clear liquids after:  7:00 AM Thursday  Take these medicines the morning of surgery with a SIP OF WATER:   Nexium, Propranolol, Zyrtec, Use Inhaler and nasal spray per normal routine  Bring rescue Asthma Inhaler day of surgery  Stop ALL herbal medications at this time  Do NOT smoke the day of surgery.  Do NOT wear jewelry (body piercing), metal hair clips/bobby pins, make-up, artifical eyelashes or nail polish. Do NOT wear lotions, powders, or perfumes.  You may wear deodorant. Do NOT shave for 48 hours prior to surgery. Do NOT bring valuables to the hospital. Contacts, dentures, or bridgework may not be worn into surgery.  Leave suitcase in car.  After surgery it may be brought to your room.  For patients admitted to the hospital, checkout time is 11:00 AM the day of discharge.  Bring a copy of your healthcare power of attorney and living will documents.

## 2017-08-15 ENCOUNTER — Inpatient Hospital Stay (HOSPITAL_COMMUNITY)
Admission: RE | Admit: 2017-08-15 | Discharge: 2017-08-17 | DRG: 743 | Disposition: A | Discharge status: Home / Self Care | Payer: 59 | Source: Ambulatory Visit | Attending: Obstetrics & Gynecology | Admitting: Obstetrics & Gynecology

## 2017-08-15 ENCOUNTER — Encounter (HOSPITAL_COMMUNITY): Payer: Self-pay | Admitting: Emergency Medicine

## 2017-08-15 ENCOUNTER — Encounter (HOSPITAL_COMMUNITY)
Admission: RE | Disposition: A | Discharge status: Home / Self Care | Payer: Self-pay | Source: Ambulatory Visit | Attending: Obstetrics & Gynecology

## 2017-08-15 ENCOUNTER — Inpatient Hospital Stay (HOSPITAL_COMMUNITY): Payer: 59 | Admitting: Anesthesiology

## 2017-08-15 DIAGNOSIS — R102 Pelvic and perineal pain: Secondary | ICD-10-CM | POA: Diagnosis present

## 2017-08-15 DIAGNOSIS — D259 Leiomyoma of uterus, unspecified: Principal | ICD-10-CM | POA: Diagnosis present

## 2017-08-15 DIAGNOSIS — G8929 Other chronic pain: Secondary | ICD-10-CM | POA: Diagnosis present

## 2017-08-15 DIAGNOSIS — N800 Endometriosis of uterus: Secondary | ICD-10-CM | POA: Diagnosis present

## 2017-08-15 DIAGNOSIS — Z87891 Personal history of nicotine dependence: Secondary | ICD-10-CM

## 2017-08-15 DIAGNOSIS — N939 Abnormal uterine and vaginal bleeding, unspecified: Secondary | ICD-10-CM | POA: Diagnosis not present

## 2017-08-15 DIAGNOSIS — Z90711 Acquired absence of uterus with remaining cervical stump: Secondary | ICD-10-CM | POA: Insufficient documentation

## 2017-08-15 DIAGNOSIS — Z880 Allergy status to penicillin: Secondary | ICD-10-CM

## 2017-08-15 DIAGNOSIS — N809 Endometriosis, unspecified: Secondary | ICD-10-CM | POA: Diagnosis not present

## 2017-08-15 HISTORY — PX: ABDOMINAL HYSTERECTOMY: SHX81

## 2017-08-15 LAB — PREPARE RBC (CROSSMATCH)

## 2017-08-15 LAB — PREGNANCY, URINE: Preg Test, Ur: NEGATIVE

## 2017-08-15 SURGERY — HYSTERECTOMY, ABDOMINAL
Anesthesia: General | Site: Abdomen | Laterality: Bilateral

## 2017-08-15 MED ORDER — ONDANSETRON HCL 4 MG/2ML IJ SOLN: 4.0000 mg | Freq: Four times a day (QID) | INTRAMUSCULAR | Status: DC | PRN

## 2017-08-15 MED ORDER — SENNOSIDES-DOCUSATE SODIUM 8.6-50 MG PO TABS: 1.0000 | ORAL_TABLET | Freq: Every evening | ORAL | Status: DC | PRN

## 2017-08-15 MED ORDER — PROPOFOL 10 MG/ML IV BOLUS
INTRAVENOUS | Status: DC | PRN
  Administered 2017-08-15: 170 mg via INTRAVENOUS

## 2017-08-15 MED ORDER — TIOTROPIUM BROMIDE MONOHYDRATE 2.5 MCG/ACT IN AERS: 2.0000 | INHALATION_SPRAY | Freq: Every day | RESPIRATORY_TRACT | Status: DC

## 2017-08-15 MED ORDER — GENTAMICIN SULFATE 40 MG/ML IJ SOLN
INTRAMUSCULAR | Status: AC
  Administered 2017-08-15: 114 mL via INTRAVENOUS
  Filled 2017-08-15: qty 8

## 2017-08-15 MED ORDER — PROPOFOL 10 MG/ML IV BOLUS
INTRAVENOUS | Status: AC
  Filled 2017-08-15: qty 20

## 2017-08-15 MED ORDER — ZOLPIDEM TARTRATE 5 MG PO TABS: 5.0000 mg | ORAL_TABLET | Freq: Every evening | ORAL | Status: DC | PRN

## 2017-08-15 MED ORDER — LOSARTAN POTASSIUM 50 MG PO TABS
50.0000 mg | ORAL_TABLET | Freq: Every day | ORAL | Status: DC
  Administered 2017-08-16: 50 mg via ORAL
  Filled 2017-08-15 (×2): qty 1

## 2017-08-15 MED ORDER — NALOXONE HCL 0.4 MG/ML IJ SOLN: 0.4000 mg | INTRAMUSCULAR | Status: DC | PRN

## 2017-08-15 MED ORDER — FENTANYL CITRATE (PF) 100 MCG/2ML IJ SOLN
INTRAMUSCULAR | Status: DC | PRN
  Administered 2017-08-15: 100 ug via INTRAVENOUS
  Administered 2017-08-15: 25 ug via INTRAVENOUS
  Administered 2017-08-15 (×3): 50 ug via INTRAVENOUS
  Administered 2017-08-15: 25 ug via INTRAVENOUS

## 2017-08-15 MED ORDER — DIPHENHYDRAMINE HCL 50 MG/ML IJ SOLN: 12.5000 mg | Freq: Four times a day (QID) | INTRAMUSCULAR | Status: DC | PRN

## 2017-08-15 MED ORDER — HYDROMORPHONE HCL 1 MG/ML IJ SOLN
0.2500 mg | INTRAMUSCULAR | Status: DC | PRN
  Administered 2017-08-15 (×4): 0.5 mg via INTRAVENOUS

## 2017-08-15 MED ORDER — MIDAZOLAM HCL 2 MG/2ML IJ SOLN
INTRAMUSCULAR | Status: AC
  Filled 2017-08-15: qty 2

## 2017-08-15 MED ORDER — GLYCOPYRROLATE 0.2 MG/ML IJ SOLN
INTRAMUSCULAR | Status: DC | PRN
  Administered 2017-08-15: .8 mg via INTRAVENOUS

## 2017-08-15 MED ORDER — KETOROLAC TROMETHAMINE 30 MG/ML IJ SOLN
30.0000 mg | Freq: Once | INTRAMUSCULAR | Status: AC
  Administered 2017-08-15: 30 mg via INTRAVENOUS
  Filled 2017-08-15: qty 1

## 2017-08-15 MED ORDER — LIDOCAINE HCL (CARDIAC) 20 MG/ML IV SOLN
INTRAVENOUS | Status: DC | PRN
  Administered 2017-08-15: 80 mg via INTRAVENOUS

## 2017-08-15 MED ORDER — BUPIVACAINE HCL (PF) 0.5 % IJ SOLN
INTRAMUSCULAR | Status: DC | PRN
  Administered 2017-08-15: 30 mL

## 2017-08-15 MED ORDER — MIDAZOLAM HCL 2 MG/2ML IJ SOLN
INTRAMUSCULAR | Status: DC | PRN
  Administered 2017-08-15: 2 mg via INTRAVENOUS

## 2017-08-15 MED ORDER — OXYBUTYNIN CHLORIDE ER 10 MG PO TB24
10.0000 mg | ORAL_TABLET | Freq: Every day | ORAL | Status: DC
  Administered 2017-08-15 – 2017-08-16 (×2): 10 mg via ORAL
  Filled 2017-08-15 (×3): qty 1

## 2017-08-15 MED ORDER — SIMETHICONE 80 MG PO CHEW: 80.0000 mg | CHEWABLE_TABLET | Freq: Four times a day (QID) | ORAL | Status: DC | PRN

## 2017-08-15 MED ORDER — HYDROMORPHONE HCL 1 MG/ML IJ SOLN
INTRAMUSCULAR | Status: AC
  Filled 2017-08-15: qty 1

## 2017-08-15 MED ORDER — OXYCODONE-ACETAMINOPHEN 5-325 MG PO TABS
1.0000 | ORAL_TABLET | ORAL | Status: DC | PRN
  Administered 2017-08-16 (×2): 2 via ORAL
  Filled 2017-08-15 (×2): qty 2

## 2017-08-15 MED ORDER — ESCITALOPRAM OXALATE 20 MG PO TABS
20.0000 mg | ORAL_TABLET | Freq: Every day | ORAL | Status: DC
  Administered 2017-08-16 – 2017-08-17 (×2): 20 mg via ORAL
  Filled 2017-08-15 (×4): qty 1

## 2017-08-15 MED ORDER — LACTATED RINGERS IV SOLN
INTRAVENOUS | Status: DC
  Administered 2017-08-15: 21:00:00 via INTRAVENOUS

## 2017-08-15 MED ORDER — ONDANSETRON HCL 4 MG/2ML IJ SOLN
4.0000 mg | Freq: Four times a day (QID) | INTRAMUSCULAR | Status: DC | PRN
Start: 2017-08-15 — End: 2017-08-17

## 2017-08-15 MED ORDER — MENTHOL 3 MG MT LOZG
1.0000 | LOZENGE | OROMUCOSAL | Status: DC | PRN
  Administered 2017-08-15: 3 mg via ORAL
  Filled 2017-08-15: qty 9

## 2017-08-15 MED ORDER — DIPHENHYDRAMINE HCL 12.5 MG/5ML PO ELIX: 12.5000 mg | ORAL_SOLUTION | Freq: Four times a day (QID) | ORAL | Status: DC | PRN

## 2017-08-15 MED ORDER — SCOPOLAMINE 1 MG/3DAYS TD PT72
MEDICATED_PATCH | TRANSDERMAL | Status: AC
  Administered 2017-08-15: 1.5 mg via TRANSDERMAL
  Filled 2017-08-15: qty 1

## 2017-08-15 MED ORDER — IBUPROFEN 600 MG PO TABS: 600.0000 mg | ORAL_TABLET | Freq: Four times a day (QID) | ORAL | Status: DC | PRN

## 2017-08-15 MED ORDER — HYDROMORPHONE 1 MG/ML IV SOLN
INTRAVENOUS | Status: DC
  Administered 2017-08-15: 17:00:00 via INTRAVENOUS
  Filled 2017-08-15: qty 25

## 2017-08-15 MED ORDER — ONDANSETRON HCL 4 MG/2ML IJ SOLN
INTRAMUSCULAR | Status: AC
  Filled 2017-08-15: qty 2

## 2017-08-15 MED ORDER — FENTANYL CITRATE (PF) 250 MCG/5ML IJ SOLN
INTRAMUSCULAR | Status: AC
  Filled 2017-08-15: qty 5

## 2017-08-15 MED ORDER — DOCUSATE SODIUM 100 MG PO CAPS
100.0000 mg | ORAL_CAPSULE | Freq: Two times a day (BID) | ORAL | Status: DC
  Administered 2017-08-15 – 2017-08-17 (×4): 100 mg via ORAL
  Filled 2017-08-15 (×4): qty 1

## 2017-08-15 MED ORDER — DEXAMETHASONE SODIUM PHOSPHATE 10 MG/ML IJ SOLN
INTRAMUSCULAR | Status: DC | PRN
  Administered 2017-08-15: 4 mg via INTRAVENOUS

## 2017-08-15 MED ORDER — SODIUM CHLORIDE 0.9% FLUSH: 9.0000 mL | INTRAVENOUS | Status: DC | PRN

## 2017-08-15 MED ORDER — GABAPENTIN 300 MG PO CAPS
300.0000 mg | ORAL_CAPSULE | Freq: Three times a day (TID) | ORAL | Status: DC | PRN
  Filled 2017-08-15: qty 1

## 2017-08-15 MED ORDER — FENTANYL CITRATE (PF) 100 MCG/2ML IJ SOLN
INTRAMUSCULAR | Status: AC
Start: 2017-08-15 — End: 2017-08-15
  Filled 2017-08-15: qty 2

## 2017-08-15 MED ORDER — ROCURONIUM BROMIDE 100 MG/10ML IV SOLN
INTRAVENOUS | Status: DC | PRN
  Administered 2017-08-15: 60 mg via INTRAVENOUS

## 2017-08-15 MED ORDER — ONDANSETRON HCL 4 MG PO TABS: 4.0000 mg | ORAL_TABLET | Freq: Four times a day (QID) | ORAL | Status: DC | PRN

## 2017-08-15 MED ORDER — ALUM & MAG HYDROXIDE-SIMETH 200-200-20 MG/5ML PO SUSP: 30.0000 mL | ORAL | Status: DC | PRN

## 2017-08-15 MED ORDER — LIDOCAINE HCL (CARDIAC) 20 MG/ML IV SOLN
INTRAVENOUS | Status: AC
  Filled 2017-08-15: qty 5

## 2017-08-15 MED ORDER — LACTATED RINGERS IV SOLN
INTRAVENOUS | Status: DC
  Administered 2017-08-15 (×3): via INTRAVENOUS

## 2017-08-15 MED ORDER — HYDROMORPHONE HCL 1 MG/ML IJ SOLN: 0.2000 mg | INTRAMUSCULAR | Status: DC | PRN

## 2017-08-15 MED ORDER — PROPRANOLOL HCL ER 80 MG PO CP24
80.0000 mg | ORAL_CAPSULE | Freq: Every day | ORAL | Status: DC
  Administered 2017-08-16 – 2017-08-17 (×2): 80 mg via ORAL
  Filled 2017-08-15 (×3): qty 1

## 2017-08-15 MED ORDER — PANTOPRAZOLE SODIUM 40 MG PO TBEC
40.0000 mg | DELAYED_RELEASE_TABLET | Freq: Every day | ORAL | Status: DC
  Administered 2017-08-15: 40 mg via ORAL

## 2017-08-15 MED ORDER — OXYCODONE HCL 5 MG PO TABS: 5.0000 mg | ORAL_TABLET | Freq: Once | ORAL | Status: DC | PRN

## 2017-08-15 MED ORDER — TIOTROPIUM BROMIDE MONOHYDRATE 18 MCG IN CAPS
18.0000 ug | ORAL_CAPSULE | Freq: Every day | RESPIRATORY_TRACT | Status: DC
  Administered 2017-08-16: 18 ug via RESPIRATORY_TRACT
  Filled 2017-08-15: qty 5

## 2017-08-15 MED ORDER — MOMETASONE FURO-FORMOTEROL FUM 200-5 MCG/ACT IN AERO
2.0000 | INHALATION_SPRAY | Freq: Two times a day (BID) | RESPIRATORY_TRACT | Status: DC
  Administered 2017-08-15 – 2017-08-16 (×3): 2 via RESPIRATORY_TRACT
  Filled 2017-08-15: qty 8.8

## 2017-08-15 MED ORDER — DEXAMETHASONE SODIUM PHOSPHATE 4 MG/ML IJ SOLN
INTRAMUSCULAR | Status: AC
  Filled 2017-08-15: qty 1

## 2017-08-15 MED ORDER — ROCURONIUM BROMIDE 100 MG/10ML IV SOLN
INTRAVENOUS | Status: AC
  Filled 2017-08-15: qty 1

## 2017-08-15 MED ORDER — LORATADINE 10 MG PO TABS
10.0000 mg | ORAL_TABLET | Freq: Every day | ORAL | Status: DC
  Administered 2017-08-16 – 2017-08-17 (×2): 10 mg via ORAL
  Filled 2017-08-15 (×3): qty 1

## 2017-08-15 MED ORDER — GLYCOPYRROLATE 0.2 MG/ML IJ SOLN
INTRAMUSCULAR | Status: AC
  Filled 2017-08-15: qty 4

## 2017-08-15 MED ORDER — ALBUTEROL SULFATE (2.5 MG/3ML) 0.083% IN NEBU
2.5000 mg | INHALATION_SOLUTION | RESPIRATORY_TRACT | Status: DC | PRN
  Administered 2017-08-15: 2.5 mg via RESPIRATORY_TRACT
  Filled 2017-08-15: qty 3

## 2017-08-15 MED ORDER — PANTOPRAZOLE SODIUM 40 MG PO TBEC
80.0000 mg | DELAYED_RELEASE_TABLET | Freq: Every day | ORAL | Status: DC
  Administered 2017-08-16: 80 mg via ORAL
  Filled 2017-08-15 (×2): qty 2

## 2017-08-15 MED ORDER — SCOPOLAMINE 1 MG/3DAYS TD PT72
1.0000 | MEDICATED_PATCH | Freq: Once | TRANSDERMAL | Status: DC
  Administered 2017-08-15: 1.5 mg via TRANSDERMAL

## 2017-08-15 MED ORDER — NEOSTIGMINE METHYLSULFATE 10 MG/10ML IV SOLN
INTRAVENOUS | Status: AC
  Filled 2017-08-15: qty 1

## 2017-08-15 MED ORDER — BISACODYL 10 MG RE SUPP: 10.0000 mg | Freq: Every day | RECTAL | Status: DC | PRN

## 2017-08-15 MED ORDER — OXYCODONE HCL 5 MG/5ML PO SOLN: 5.0000 mg | Freq: Once | ORAL | Status: DC | PRN

## 2017-08-15 MED ORDER — KETOROLAC TROMETHAMINE 30 MG/ML IJ SOLN
INTRAMUSCULAR | Status: DC | PRN
  Administered 2017-08-15: 30 mg via INTRAVENOUS

## 2017-08-15 MED ORDER — SUCCINYLCHOLINE CHLORIDE 200 MG/10ML IV SOSY
PREFILLED_SYRINGE | INTRAVENOUS | Status: AC
  Filled 2017-08-15: qty 10

## 2017-08-15 MED ORDER — ONDANSETRON HCL 4 MG/2ML IJ SOLN
INTRAMUSCULAR | Status: DC | PRN
  Administered 2017-08-15: 4 mg via INTRAVENOUS

## 2017-08-15 MED ORDER — HYDROMORPHONE HCL 1 MG/ML IJ SOLN
INTRAMUSCULAR | Status: DC | PRN
  Administered 2017-08-15 (×2): 0.5 mg via INTRAVENOUS

## 2017-08-15 MED ORDER — KETOROLAC TROMETHAMINE 30 MG/ML IJ SOLN
INTRAMUSCULAR | Status: AC
  Filled 2017-08-15: qty 1

## 2017-08-15 MED ORDER — SIMVASTATIN 40 MG PO TABS
40.0000 mg | ORAL_TABLET | Freq: Every day | ORAL | Status: DC
  Administered 2017-08-15 – 2017-08-16 (×2): 40 mg via ORAL
  Filled 2017-08-15 (×3): qty 1

## 2017-08-15 MED ORDER — PNEUMOCOCCAL VAC POLYVALENT 25 MCG/0.5ML IJ INJ
0.5000 mL | INJECTION | INTRAMUSCULAR | Status: DC
  Filled 2017-08-15: qty 0.5

## 2017-08-15 MED ORDER — SUMATRIPTAN SUCCINATE 50 MG PO TABS
50.0000 mg | ORAL_TABLET | ORAL | Status: DC | PRN
  Filled 2017-08-15: qty 1

## 2017-08-15 MED ORDER — NEOSTIGMINE METHYLSULFATE 10 MG/10ML IV SOLN
INTRAVENOUS | Status: DC | PRN
  Administered 2017-08-15: 5 mg via INTRAVENOUS

## 2017-08-15 MED ORDER — BUPIVACAINE HCL (PF) 0.5 % IJ SOLN
INTRAMUSCULAR | Status: AC
  Filled 2017-08-15: qty 30

## 2017-08-15 MED ORDER — MONTELUKAST SODIUM 10 MG PO TABS
10.0000 mg | ORAL_TABLET | Freq: Every day | ORAL | Status: DC
  Administered 2017-08-15 – 2017-08-16 (×2): 10 mg via ORAL
  Filled 2017-08-15 (×3): qty 1

## 2017-08-15 MED ORDER — MAGNESIUM CITRATE PO SOLN
1.0000 | Freq: Once | ORAL | Status: AC | PRN
  Administered 2017-08-16: 1 via ORAL
  Filled 2017-08-15: qty 296

## 2017-08-15 SURGICAL SUPPLY — 39 items
BENZOIN TINCTURE PRP APPL 2/3 (GAUZE/BANDAGES/DRESSINGS) IMPLANT
CANISTER SUCT 3000ML PPV (MISCELLANEOUS) ×2 IMPLANT
CELLS DAT CNTRL 66122 CELL SVR (MISCELLANEOUS) ×1 IMPLANT
CLOTH BEACON ORANGE TIMEOUT ST (SAFETY) ×2 IMPLANT
CONT PATH 16OZ SNAP LID 3702 (MISCELLANEOUS) ×2 IMPLANT
DECANTER SPIKE VIAL GLASS SM (MISCELLANEOUS) ×2 IMPLANT
DRAPE WARM FLUID 44X44 (DRAPE) ×2 IMPLANT
DRSG OPSITE POSTOP 4X10 (GAUZE/BANDAGES/DRESSINGS) ×2 IMPLANT
DURAPREP 26ML APPLICATOR (WOUND CARE) ×2 IMPLANT
GAUZE SPONGE 4X4 16PLY XRAY LF (GAUZE/BANDAGES/DRESSINGS) ×2 IMPLANT
GLOVE BIOGEL PI IND STRL 7.0 (GLOVE) ×3 IMPLANT
GLOVE BIOGEL PI INDICATOR 7.0 (GLOVE) ×3
GLOVE ECLIPSE 7.0 STRL STRAW (GLOVE) ×2 IMPLANT
GOWN STRL REUS W/TWL LRG LVL3 (GOWN DISPOSABLE) ×4 IMPLANT
NEEDLE HYPO 22GX1.5 SAFETY (NEEDLE) ×2 IMPLANT
NS IRRIG 1000ML POUR BTL (IV SOLUTION) ×2 IMPLANT
PACK ABDOMINAL GYN (CUSTOM PROCEDURE TRAY) ×2 IMPLANT
PAD OB MATERNITY 4.3X12.25 (PERSONAL CARE ITEMS) ×2 IMPLANT
PENCIL SMOKE EVAC W/HOLSTER (ELECTROSURGICAL) ×2 IMPLANT
PROTECTOR NERVE ULNAR (MISCELLANEOUS) ×4 IMPLANT
RTRCTR WOUND ALEXIS 18CM MED (MISCELLANEOUS) ×2
SPONGE GAUZE 4X4 12PLY STER LF (GAUZE/BANDAGES/DRESSINGS) ×2 IMPLANT
SPONGE LAP 18X18 X RAY DECT (DISPOSABLE) ×4 IMPLANT
STAPLER VISISTAT 35W (STAPLE) IMPLANT
STRIP CLOSURE SKIN 1/2X4 (GAUZE/BANDAGES/DRESSINGS) ×2 IMPLANT
SUT PDS AB 0 CTX 60 (SUTURE) ×2 IMPLANT
SUT PLAIN 2 0 (SUTURE)
SUT PLAIN ABS 2-0 CT1 27XMFL (SUTURE) IMPLANT
SUT VIC AB 0 CT1 18XCR BRD8 (SUTURE) ×3 IMPLANT
SUT VIC AB 0 CT1 27 (SUTURE) ×1
SUT VIC AB 0 CT1 27XBRD ANBCTR (SUTURE) ×1 IMPLANT
SUT VIC AB 0 CT1 8-18 (SUTURE) ×3
SUT VIC AB 0 CTX 36 (SUTURE)
SUT VIC AB 0 CTX36XBRD ANBCTRL (SUTURE) IMPLANT
SUT VIC AB 4-0 KS 27 (SUTURE) ×2 IMPLANT
SUT VICRYL 0 TIES 12 18 (SUTURE) ×2 IMPLANT
SYR CONTROL 10ML LL (SYRINGE) ×2 IMPLANT
TOWEL OR 17X24 6PK STRL BLUE (TOWEL DISPOSABLE) ×4 IMPLANT
TRAY FOLEY CATH SILVER 14FR (SET/KITS/TRAYS/PACK) ×2 IMPLANT

## 2017-08-15 NOTE — Discharge Instructions (Signed)
Supracervical Hysterectomy, Care After Refer to this sheet in the next few weeks. These instructions provide you with information on caring for yourself after your procedure. Your health care provider may also give you more specific instructions. Your treatment has been planned according to current medical practices, but problems sometimes occur. Call your health care provider if you have any problems or questions after your procedure. What can I expect after the procedure? After your procedure, it is typical to have some discomfort, tenderness, swelling, and bruising at the surgical sites. This normally lasts for about 2 weeks. Follow these instructions at home:  Get plenty of rest and sleep.  Only take over-the-counter or prescription medicines as directed by your health care provider.  Do not take aspirin. It can cause bleeding.  Do not drive until your health care provider approves.  Follow your health care provider's advice regarding exercise, lifting, and general activities.  Resume your usual diet as directed by your health care provider.  Do not douche, use tampons, or have sexual intercourse for at least 6 weeks or until your health care provider gives you permission.  Change your bandages (dressings) only as directed by your health care provider.  Monitor your temperature.  Take showers instead of baths for 2-3 weeks or as directed by your health care provider.  Drink enough fluids to keep your urine clear or pale yellow.  Do not drink alcohol until your health care provider gives you permission.  If you are constipated, you may take a mild laxative if your health care provider approves. Bran foods may also help with constipation problems.  Try to have someone home with you for 1-2 weeks to help with activities.  Follow up with your health care provider as directed. Contact a health care provider if:  You have swelling, redness, or increasing pain in the incision  area.  You have pus coming from an incision.  You notice a bad smell coming from the incision or dressing.  You have swelling, redness, or pain in the area around the IV site.  Your incision breaks open.  You feel dizzy or lightheaded.  You have pain or bleeding when you urinate.  You have persistent diarrhea.  You have persistent nausea and vomiting.  You have abnormal vaginal discharge.  You have a rash.  Your pain is not controlled with your prescribed medicine. Get help right away if:  You have a fever.  You have severe abdominal pain.  You have chest pain.  You have shortness of breath.  You faint.  You have pain, swelling, or redness in your leg.  You have heavy vaginal bleeding with blood clots. This information is not intended to replace advice given to you by your health care provider. Make sure you discuss any questions you have with your health care provider. Document Released: 08/05/2013 Document Revised: 03/22/2016 Document Reviewed: 04/17/2013 Elsevier Interactive Patient Education  [employer].

## 2017-08-15 NOTE — H&P (Signed)
Preoperative History and Physical  [name] is a 51 y.o. female here for surgical management of continued AUB s/p failed endometrial ablation and medical management, also has chronic pain.   No significant preoperative concerns.  Proposed surgery:  Total abdominal hysterectomy (TAH) and bilateral salpingoophorectomy (BS)  Past Medical History:  Diagnosis Date  . Anemia   . Anxiety   . Asthma   . COPD (chronic obstructive pulmonary disease) ([place])   . Depression   . Dysrhythmia    Irregular heat w/ asthma episodes and anxiety  . GERD (gastroesophageal reflux disease)   . Hypertension   . Migraines   . PONV (postoperative nausea and vomiting)   . Pre-diabetes   . Sleep apnea    uses CPAP  . Urinary incontinence    frequent urination- tx w/ vesicare  . Urinary urgency    Past Surgical History:  Procedure Laterality Date  . ABLATION    . CESAREAN SECTION     x3  . DILATION AND CURETTAGE OF UTERUS  05/31/2005   MAB, TAB x 2  . HERNIA REPAIR  1990's  . HYSTEROSCOPY W/ ENDOMETRIAL ABLATION    . LEEP    . MAB     x 4  . MOUTH SURGERY     teeth extraction  . TUBAL LIGATION  11/01/2006  . WISDOM TOOTH EXTRACTION     OB History  Gravida Para Term Preterm AB Living  15 8 8  0 7 4  SAB TAB Ectopic Multiple Live Births  2 1          # Outcome Date GA Lbr Len/2nd Weight Sex Delivery Anes PTL Lv  15 AB           14 AB           13 AB           12 AB           11 Term      CS-LTranv     10 Term      CS-LTranv     9 Term      CS-LTranv     8 Term      Vag-Spont     7 SAB           6 SAB           5 TAB           4 Term           3 Term           2 Term           1 Term             Patient denies any other pertinent gynecologic issues.   No current facility-administered medications on file prior to encounter.    Current Outpatient Prescriptions on File Prior to Encounter  Medication Sig Dispense Refill  . albuterol (PROVENTIL HFA;VENTOLIN HFA) 108 (90 Base) MCG/ACT  inhaler 2 puffs every 3-4 hours as needed for shortness of breath\wheezing\recurrent cough (Patient taking differently: Inhale 2 puffs into the lungs every 4 (four) hours as needed for wheezing or shortness of breath. ) 1 Inhaler 6  . albuterol (PROVENTIL) (2.5 MG/3ML) 0.083% nebulizer solution Take 3 mLs (2.5 mg total) by nebulization every 4 (four) hours as needed. 75 mL 6  . baclofen (LIORESAL) 10 MG tablet Take 1 tablet (10 mg total) by mouth 3 (three) times daily as needed (headaches). [place]  each 1  . BIOTIN PO Take 2,000 mg by mouth daily.    . cetirizine (ZYRTEC) 10 MG tablet Take 1 tablet (10 mg total) by mouth daily. 30 tablet 11  . diclofenac (VOLTAREN) 75 MG EC tablet Take 1 tablet (75 mg total) by mouth 2 (two) times daily with a meal. 60 tablet 0  . escitalopram (LEXAPRO) 20 MG tablet Take 1 tablet (20 mg total) by mouth daily. 30 tablet 3  . esomeprazole (NEXIUM) 40 MG capsule TAKE 1 CAPSULE BY MOUTH ONCE DAILY 30 capsule 6  . estrogen, conjugated,-medroxyprogesterone (PREMPRO) 0.3-1.5 MG per tablet Take 1 tablet by mouth daily. 30 tablet 12  . fluticasone (FLONASE) 50 MCG/ACT nasal spray PLACE 2 SPRAYS INTO THE NOSE DAILY. 16 g 6  . furosemide (LASIX) 20 MG tablet TAKE 1 TABLET BY MOUTH DAILY. 30 tablet 3  . gabapentin (NEURONTIN) 300 MG capsule Take 1 capsule (300 mg total) by mouth every 8 (eight) hours as needed. (Patient taking differently: Take 300 mg by mouth every 8 (eight) hours as needed (PAIN). ) 60 capsule 2  . losartan (COZAAR) 50 MG tablet TAKE 1 TABLET BY MOUTH ONCE DAILY 30 tablet 1  . megestrol (MEGACE) 40 MG tablet Take 2 tablets (80 mg total) by mouth daily. 60 tablet 2  . mometasone-formoterol (DULERA) 200-5 MCG/ACT AERO Inhale 2 puffs into the lungs 2 (two) times daily. 8.8 g 5  . montelukast (SINGULAIR) 10 MG tablet Take 1 tablet (10 mg total) by mouth at bedtime. 90 tablet 3  . ondansetron (ZOFRAN) 4 MG tablet Take 1 tablet (4 mg total) by mouth every 8 (eight) hours  as needed for nausea or vomiting. 6 tablet 0  . oxybutynin (DITROPAN-XL) 10 MG 24 hr tablet Take 1 tablet (10 mg total) by mouth at bedtime. 30 tablet 0  . propranolol ER (INDERAL LA) 80 MG 24 hr capsule Take 1 capsule (80 mg total) by mouth daily. 30 capsule 2  . Pyridoxine HCl (VITAMIN B-6 PO) Take 1 tablet by mouth daily. Unknown OTC strength    . simvastatin (ZOCOR) 40 MG tablet TAKE 1 TABLET BY MOUTH AT BEDTIME. 90 tablet 3  . Tiotropium Bromide Monohydrate (SPIRIVA RESPIMAT) 2.5 MCG/ACT AERS Inhale 2 puffs into the lungs daily. 4 g 3  . traMADol (ULTRAM) 50 MG tablet Take 2 tablets (100 mg total) by mouth every 6 (six) hours as needed. (Patient taking differently: Take 100 mg by mouth every 6 (six) hours as needed for moderate pain. ) 30 tablet 0  . vitamin C (ASCORBIC ACID) 500 MG tablet Take 500 mg by mouth daily.     . naproxen (NAPROSYN) 500 MG tablet Take 1 tablet (500 mg total) by mouth 2 (two) times daily with a meal. (Patient taking differently: Take 500 mg by mouth daily as needed for mild pain. ) 60 tablet 0  . Spacer/Aero-Holding Chambers (AEROCHAMBER MV) inhaler Use as instructed 1 each 0  . [DISCONTINUED] medroxyPROGESTERone (DEPO-PROVERA) 150 MG/ML injection Inject 1 mL (150 mg total) into the muscle every 3 (three) months. 1 mL 0  . [DISCONTINUED] medroxyPROGESTERone (PROVERA) 10 MG tablet Take 2 tablets (20 mg total) by mouth daily. 60 tablet 2   Allergies  Allergen Reactions  . Amitriptyline     Abnormal behavior. Just doesn't feel like normal self   . Fluticasone-Salmeterol Other (See Comments)    Thrush even with mouth rinsing; worsens cough  . Penicillins Itching and Swelling    Has patient had a  PCN reaction causing immediate rash, facial/tongue/throat swelling, SOB or lightheadedness with hypotension: No Has patient had a PCN reaction causing severe rash involving mucus membranes or skin necrosis: No Has patient had a PCN reaction that required hospitalization:  No Has patient had a PCN reaction occurring within the last 10 years: Yes If all of the above answers are "NO", then may proceed with Cephalosporin use.   . Ciprofloxacin Itching and Rash  . Macrobid [Nitrofurantoin Monohyd Macro] Itching  . Sulfamethoxazole-Trimethoprim Rash    Social History:   reports that she quit smoking about 14 years ago. Her smoking use included Cigarettes. She has a 3.75 pack-year smoking history. She has never used smokeless tobacco. She reports that she drinks alcohol. She reports that she does not use drugs.  Family History  Problem Relation Age of Onset  . Diabetes Maternal Grandmother   . Anesthesia problems Neg Hx   . Headache Neg Hx   . Aneurysm Neg Hx     Review of Systems: Noncontributory  PHYSICAL EXAM: Blood pressure (!) 146/95, pulse (!) 58, temperature 98.4 F (36.9 C), temperature source Oral, resp. rate 16, SpO2 100 %. CONSTITUTIONAL: Well-developed, well-nourished female in no acute distress.  HENT:  Normocephalic, atraumatic, External right and left ear normal. Oropharynx is clear and moist EYES: Conjunctivae and EOM are normal. Pupils are equal, round, and reactive to light. No scleral icterus.  NECK: Normal range of motion, supple, no masses SKIN: Skin is warm and dry. No rash noted. Not diaphoretic. No erythema. No pallor. NEUROLOGIC: Alert and oriented to person, place, and time. Normal reflexes, muscle tone coordination. No cranial nerve deficit noted. PSYCHIATRIC: Normal mood and affect. Normal behavior. Normal judgment and thought content. CARDIOVASCULAR: Normal heart rate noted, regular rhythm RESPIRATORY: Effort and breath sounds normal, no problems with respiration noted ABDOMEN: Soft, nontender, nondistended. PELVIC: Deferred MUSCULOSKELETAL: Normal range of motion. No edema and no tenderness. 2+ distal pulses.  Labs: Results for orders placed or performed during the hospital encounter of 08/15/17 (from the past 336 hour(s))   Prepare RBC (crossmatch)   Collection Time: 08/15/17 10:53 AM  Result Value Ref Range   Order Confirmation ORDER PROCESSED BY BLOOD BANK   Pregnancy, urine   Collection Time: 08/15/17 11:00 AM  Result Value Ref Range   Preg Test, Ur NEGATIVE NEGATIVE  Results for orders placed or performed during the hospital encounter of 08/08/17 (from the past 336 hour(s))  CBC   Collection Time: 08/08/17  1:50 PM  Result Value Ref Range   WBC 5.6 4.0 - 10.5 K/uL   RBC 4.02 3.87 - 5.11 MIL/uL   Hemoglobin 12.5 12.0 - 15.0 g/dL   HCT 37.3 36.0 - 46.0 %   MCV 92.8 78.0 - 100.0 fL   MCH 31.1 26.0 - 34.0 pg   MCHC 33.5 30.0 - 36.0 g/dL   RDW 13.2 11.5 - 15.5 %   Platelets 259 150 - 400 K/uL  Type and screen   Collection Time: 08/08/17  1:50 PM  Result Value Ref Range   ABO/RH(D) AB POS    Antibody Screen NEG    Sample Expiration 08/22/2017    Extend sample reason NO TRANSFUSIONS OR PREGNANCY IN THE PAST 3 MONTHS    Unit Number [id number]    Blood Component Type RED CELLS,LR    Unit division 00    Status of Unit ALLOCATED    Transfusion Status OK TO TRANSFUSE    Crossmatch Result Compatible    Unit Number [id number]  Blood Component Type RED CELLS,LR    Unit division 00    Status of Unit ALLOCATED    Transfusion Status OK TO TRANSFUSE    Crossmatch Result Compatible   ABO/Rh   Collection Time: 08/08/17  1:50 PM  Result Value Ref Range   ABO/RH(D) AB POS   BPAM RBC   Collection Time: 08/08/17  1:50 PM  Result Value Ref Range   Blood Product Unit Number [id number]    Unit Type and Rh 9242    Blood Product Expiration Date [id number]    Blood Product Unit Number [id number]    Unit Type and Rh 6200    Blood Product Expiration Date 144818563149     Imaging Studies: Result Date: 05/20/2017 CLINICAL DATA:  Abnormal bleeding status post ablation in 2013 postmenopausal EXAM: TRANSABDOMINAL AND TRANSVAGINAL ULTRASOUND OF PELVIS TECHNIQUE: Both transabdominal and  transvaginal ultrasound examinations of the pelvis were performed. Transabdominal technique was performed for global imaging of the pelvis including uterus, ovaries, adnexal regions, and pelvic cul-de-sac. It was necessary to proceed with endovaginal exam following the transabdominal exam to visualize the uterus and endometrium. COMPARISON:  04/13/2016 FINDINGS: Uterus Measurements: 13.6 x 6.2 x 10.4 cm. Heterogenous echotexture. Trans abdominal images demonstrate a left anterior myometrial mass measuring 4.3 x 3.5 x 3.7 cm. Endometrium Thickness: 10 mm.  No focal abnormality visualized. Right ovary Measurements: 2.9 x 1.6 x 2.5 cm. Normal appearance/no adnexal mass. Seen only on transabdominal images Left ovary Measurements: 3 x 2.3 x 2.8 cm. Normal appearance/no adnexal mass. Seen only on transabdominal images Other findings No abnormal free fluid. IMPRESSION: 1. Heterogenous enlarged uterus with at least 1 fibroid measuring 4.3 cm. 2. Endometrial thickness is 10 mm. In the setting of post-menopausal bleeding, endometrial sampling is indicated to exclude carcinoma. If results are benign, sonohysterogram should be considered for focal lesion work-up. (Ref: Radiological Reasoning: Algorithmic Workup of Abnormal Vaginal Bleeding with Endovaginal Sonography and Sonohysterography. AJR 2008; 702:O37-85) Electronically Signed   By: Donavan Foil M.D.   On: 05/20/2017 19:48   Assessment: Patient Active Problem List   Diagnosis Date Noted  . Abnormal uterine bleeding (AUB) 08/15/2017  . Chronic female pelvic pain 08/15/2017  . Chronic migraine without aura with status migrainosus, not intractable 07/26/2017  . Sinusitis 02/25/2017  . Left knee pain 08/29/2016  . Bunion of great toe of right foot 08/29/2016  . Preoperative cardiovascular examination 08/01/2016  . Abdominal cramping 12/30/2015  . Bladder spasms 12/30/2015  . Pre-diabetes 10/03/2015  . HTN (hypertension) 10/03/2015  . Fatigue 07/21/2015  .  Urinary urgency 07/06/2015  . Flank pain 07/06/2015  . Hot flashes 10/14/2014  . Vitamin D deficiency 11/25/2013  . Cough 09/10/2013  . Back pain 06/30/2013  . Migraine 06/07/2013  . Knee pain, bilateral 10/09/2012  . Obstructive sleep apnea 09/08/2010  . HLD (hyperlipidemia) 09/20/2008  . Depression with anxiety 12/26/2006  . RHINITIS, ALLERGIC 12/26/2006  . Asthma 12/26/2006    Plan: Patient will undergo surgical management with total abdominal hysterectomy (TAH) and bilateral salpingoophorectomy (BS).   The risks of surgery were discussed in detail with the patient including but not limited to: bleeding which may require transfusion or reoperation; infection which may require antibiotics; injury to surrounding organs which may involve bowel, bladder, ureters ; need for additional procedures; thromboembolic phenomenon, surgical site problems and other postoperative/anesthesia complications. Likelihood of success in alleviating the patient's condition was discussed. Patient was also advised that she will remain in house for 2 nights; and expected  recovery time after a hysterectomy is 6-8 weeks. Routine postoperative instructions will be reviewed with the patient and her family in detail after surgery.  The patient concurred with the proposed plan, giving informed written consent for the surgery.  Patient has been NPO since last night and she will remain NPO for procedure.  Anesthesia and OR aware.  Preoperative prophylactic antibiotics and SCDs ordered on call to the OR.  To OR when ready.  Verita Schneiders, M.D. 08/15/2017 12:01 PM

## 2017-08-15 NOTE — Anesthesia Procedure Notes (Signed)
Procedure Name: Intubation Date/Time: 08/15/2017 1:05 PM Performed by: Raenette Rover Pre-anesthesia Checklist: Patient identified, Emergency Drugs available, Suction available and Patient being monitored Patient Re-evaluated:Patient Re-evaluated prior to induction Oxygen Delivery Method: Circle system utilized Preoxygenation: Pre-oxygenation with 100% oxygen Induction Type: IV induction Ventilation: Mask ventilation without difficulty Laryngoscope Size: Mac and 3 Grade View: Grade III Tube type: Oral Tube size: 7.0 mm Number of attempts: 1 Airway Equipment and Method: Stylet Placement Confirmation: ETT inserted through vocal cords under direct vision,  positive ETCO2,  CO2 detector and breath sounds checked- equal and bilateral Secured at: 22 cm Tube secured with: Tape Dental Injury: Teeth and Oropharynx as per pre-operative assessment

## 2017-08-15 NOTE — Anesthesia Preprocedure Evaluation (Signed)
Anesthesia Evaluation  Patient identified by MRN, date of birth, ID band Patient awake    Reviewed: Allergy & Precautions, H&P , NPO status , Patient's Chart, lab work & pertinent test results  History of Anesthesia Complications (+) PONV and history of anesthetic complications  Airway Mallampati: II   Neck ROM: full    Dental   Pulmonary asthma , sleep apnea , COPD, former smoker,    breath sounds clear to auscultation       Cardiovascular hypertension, + Peripheral Vascular Disease   Rhythm:regular Rate:Normal     Neuro/Psych  Headaches, PSYCHIATRIC DISORDERS Anxiety Depression    GI/Hepatic GERD  ,  Endo/Other  obese  Renal/GU      Musculoskeletal   Abdominal   Peds  Hematology  (+) anemia ,   Anesthesia Other Findings   Reproductive/Obstetrics                             Anesthesia Physical Anesthesia Plan  ASA: II  Anesthesia Plan: General   Post-op Pain Management:    Induction: Intravenous  PONV Risk Score and Plan: 4 or greater and Ondansetron, Dexamethasone, Midazolam, Scopolamine patch - Pre-op and Treatment may vary due to age or medical condition  Airway Management Planned: Oral ETT  Additional Equipment:   Intra-op Plan:   Post-operative Plan: Extubation in OR  Informed Consent: I have reviewed the patients History and Physical, chart, labs and discussed the procedure including the risks, benefits and alternatives for the proposed anesthesia with the patient or authorized representative who has indicated his/her understanding and acceptance.     Plan Discussed with: CRNA, Anesthesiologist and Surgeon  Anesthesia Plan Comments:         Anesthesia Quick Evaluation

## 2017-08-15 NOTE — Anesthesia Postprocedure Evaluation (Signed)
Anesthesia Post Note  Patient: [name]  Procedure(s) Performed: SUPRACERVICAL HYSTERECTOMY ABDOMINAL W/ BILATERAL SALPINGECTOMY AND LEFT OOPHORECTOMY (Bilateral Abdomen)     Patient location during evaluation: PACU Anesthesia Type: General Level of consciousness: awake and alert Pain management: pain level controlled Vital Signs Assessment: post-procedure vital signs reviewed and stable Respiratory status: spontaneous breathing, nonlabored ventilation, respiratory function stable and patient connected to nasal cannula oxygen Cardiovascular status: blood pressure returned to baseline and stable Postop Assessment: no apparent nausea or vomiting Anesthetic complications: no    Last Vitals:  Vitals:   08/15/17 1435 08/15/17 1445  BP: (!) 139/94 (!) 155/93  Pulse: 63 63  Resp: 15 14  Temp: 37.2 C   SpO2: 94% 93%    Last Pain:  Vitals:   08/15/17 1445  TempSrc:   PainSc: Asleep   Pain Goal: Patients Stated Pain Goal: 4 (08/15/17 1102)               [place]

## 2017-08-15 NOTE — Transfer of Care (Signed)
Immediate Anesthesia Transfer of Care Note  Patient: [name]  Procedure(s) Performed: SUPRACERVICAL HYSTERECTOMY ABDOMINAL W/ BILATERAL SALPINGECTOMY AND LEFT OOPHORECTOMY (Bilateral Abdomen)  Patient Location: PACU  Anesthesia Type:General  Level of Consciousness: awake, alert , oriented, drowsy and patient cooperative  Airway & Oxygen Therapy: Patient Spontanous Breathing and Patient connected to nasal cannula oxygen  Post-op Assessment: Report given to RN and Post -op Vital signs reviewed and stable  Post vital signs: Reviewed and stable  Last Vitals:  Vitals:   08/15/17 1102  BP: (!) 146/95  Pulse: (!) 58  Resp: 16  Temp: 36.9 C  SpO2: 100%    Last Pain:  Vitals:   08/15/17 1102  TempSrc: Oral  PainSc: 3       Patients Stated Pain Goal: 4 (07/86/75 4492)  Complications: No apparent anesthesia complications

## 2017-08-15 NOTE — Op Note (Signed)
[name] PROCEDURE DATE: 08/15/2017  PREOPERATIVE DIAGNOSES:  Abnormal uterine bleeding POSTOPERATIVE DIAGNOSES:  The same, significant adhesive disease involving cervix and bladder SURGEON:   Verita Schneiders, M.D. ASSISTANT: Darron Doom, M.D. OPERATION:  Supracervical abdominal hysterectomy, Bilateral Salpingectomy, Left oophorectomy ANESTHESIA:  General endotracheal.  INDICATIONS: The patient is a 51 y.o. [id number] with the aforementioned diagnoses who desires definitive surgical management. On the preoperative visit, the risks, benefits, indications, and alternatives of the procedure were reviewed with the patient.  On the day of surgery, the risks of surgery were again discussed with the patient including but not limited to: bleeding which may require transfusion or reoperation; infection which may require antibiotics; injury to bowel, bladder, ureters or other surrounding organs; need for additional procedures; thromboembolic phenomenon, incisional problems and other postoperative/anesthesia complications. Written informed consent was obtained.    OPERATIVE FINDINGS: A 14 week size uterus with normal tubes and ovaries bilaterally. Dense adhesion of the bladder to the cervix and lower uterine segment, unable to get the bladder dissected off bluntly or sharply.  Supracervical hysterectomy was thus performed. Left adnexal pedicle was noted to have increased bleeding not amenable to suture ligation, hence left salpingoophorectomy was performed.   ESTIMATED BLOOD LOSS: 150 ml FLUIDS:  2000 ml of Lactated Ringers URINE OUTPUT:  150 ml of clear yellow urine. SPECIMENS:  Uterus, bilateral fallopian tubes, left ovary sent to pathology COMPLICATIONS:  None immediate.   DESCRIPTION OF PROCEDURE:  The patient received intravenous antibiotics and had sequential compression devices applied to her lower extremities while in the preoperative area.   She was taken to the operating room and placed under  general anesthesia without difficulty.The abdomen and perineum were prepped and draped in a sterile manner, and she was placed in a dorsal supine position.  A Foley catheter was inserted into the bladder and attached to constant drainage. After an adequate timeout was performed, a Pfannensteil skin incision was made over her preexisting scar. This incision was taken down to the fascia using electrocautery with care given to maintain good hemostasis. The fascia was incised in the midline and the fascial incision was then extended bilaterally using electrocautery without difficulty. The fascia was then dissected off the underlying rectus muscles using blunt and sharp dissection. The rectus muscles were split bluntly in the midline and the peritoneum entered sharply without complication. This peritoneal incision was then extended superiorly and inferiorly with care given to prevent bowel or bladder injury. Attention was then turned to the pelvis. A retractor was placed into the incision, and the bowel was packed away with moist laparotomy sponges. The uterus at this point was noted to be mobilized and was delivered up out of the abdomen.  The round ligaments on each side were clamped, suture ligated with 0 Vicryl, and transected with electrocautery allowing entry into the broad ligament. Of note, all sutures used in this procedure are 0 Vicryl unless otherwise noted. The anterior and posterior leaves of the broad ligament were separated, and the ureters were inspected to be safely away from the area of dissection bilaterally.  Adnexae were clamped on the patient's right side, cut, and doubly suture ligated. This procedure was repeated in an identical fashion on the left site allowing for both adnexa to remain in place.  Kelly clamps were placed on the mesosalpinx of the right fallopian tube, and the fallopian tube was excised.  The pedicle was then secured with a free tie. On the left side, there was significant  bleeding  and this did not stop with suture ligation. Decision was made to proceed with left salpingo-oophorectomy.  The left infundibulopelvic ligament clamped, cut, and doubly suture ligated with good hemostasis.   A bladder flap was then created but adhesions made it difficult to dissect the bladder off the cervix. After much effort in trying further, the decision was made to proceed with supracervical hysterectomy.  The uterine arteries were then skeletonized bilaterally and then clamped, cut, and doubly suture ligated with care given to prevent ureteral injury. The uterus was then amputated across the lower uterine segment leaving the cervix intact. The specimen was sent to pathology. The cervical canal was coagulated, and the anterior and posterior peritoneal edges were then reapproximated in the midline over the cervical stump without complication.  The pelvis was irrigated and hemostasis was reconfirmed at all pedicles and along the pelvic sidewall.  The ureters were inspected and noted to be peristalsing bilaterally.  All laparotomy sponges and instruments were removed from the abdomen. The fascia was closed in a running fashion with 0 PDS. The skin was closed with a 4-0 Vicryl subcuticular stitch.  Sponge, lap, needle, and instrument counts were correct times three. The patient was taken to the recovery area awake, extubated and in stable condition.   Verita Schneiders, MD, [place] Attending [place], [facility]

## 2017-08-16 ENCOUNTER — Encounter (HOSPITAL_COMMUNITY): Payer: Self-pay | Admitting: Obstetrics & Gynecology

## 2017-08-16 LAB — CBC
HEMATOCRIT: 33.1 % — AB (ref 36.0–46.0)
Hemoglobin: 11.2 g/dL — ABNORMAL LOW (ref 12.0–15.0)
MCH: 31.3 pg (ref 26.0–34.0)
MCHC: 33.8 g/dL (ref 30.0–36.0)
MCV: 92.5 fL (ref 78.0–100.0)
PLATELETS: 262 10*3/uL (ref 150–400)
RBC: 3.58 MIL/uL — AB (ref 3.87–5.11)
RDW: 12.9 % (ref 11.5–15.5)
WBC: 8.2 10*3/uL (ref 4.0–10.5)

## 2017-08-16 MED ORDER — DOCUSATE SODIUM 100 MG PO CAPS: 100.0000 mg | ORAL_CAPSULE | Freq: Every day | ORAL | 2 refills | Status: DC | PRN

## 2017-08-16 MED ORDER — CONJ ESTROG-MEDROXYPROGEST ACE 0.3-1.5 MG PO TABS: 1.0000 | ORAL_TABLET | Freq: Every day | ORAL | 12 refills | Status: DC

## 2017-08-16 MED ORDER — GABAPENTIN 300 MG PO CAPS: 300.0000 mg | ORAL_CAPSULE | Freq: Three times a day (TID) | ORAL | 3 refills | Status: DC | PRN

## 2017-08-16 MED ORDER — NAPROXEN 500 MG PO TABS: 500.0000 mg | ORAL_TABLET | Freq: Two times a day (BID) | ORAL | 2 refills | Status: DC | PRN

## 2017-08-16 MED ORDER — OXYCODONE-ACETAMINOPHEN 5-325 MG PO TABS: 1.0000 | ORAL_TABLET | Freq: Four times a day (QID) | ORAL | 0 refills | Status: DC | PRN

## 2017-08-16 NOTE — Progress Notes (Signed)
1 Day Post-Op Procedure(s) (LRB): SUPRACERVICAL HYSTERECTOMY ABDOMINAL W/ BILATERAL SALPINGECTOMY AND LEFT OOPHORECTOMY (Bilateral)  Subjective: Patient reports incisional pain, tolerating PO. Awaiting spontaneous voiding and flatus.   Objective: I have reviewed patient's vital signs, intake and output, medications and labs. Vitals:   08/15/17 2226 08/15/17 2341 08/16/17 0443 08/16/17 0602  BP:  (!) 109/54 (!) 99/55   Pulse:  (!) 56 (!) 58   Resp: 17 16 18 19   Temp:  99.4 F (37.4 C) 98.4 F (36.9 C)   TempSrc:  Axillary Axillary   SpO2: 96% 96% 99%   Weight:      Height:       General: alert and no distress Resp: clear to auscultation bilaterally Cardio: regular rate and rhythm GI: incision: clean, dry and intact, abdomen soft Extremities: extremities normal, atraumatic, no cyanosis or edema and Homans sign is negative, no sign of DVT Vaginal Bleeding: none   CBC Latest Ref Rng & Units 08/16/2017 08/08/2017 05/29/2017  WBC 4.0 - 10.5 K/uL 8.2 5.6 4.4  Hemoglobin 12.0 - 15.0 g/dL 11.2(L) 12.5 12.5  Hematocrit 36.0 - 46.0 % 33.1(L) 37.3 37.0  Platelets 150 - 400 K/uL 262 259 258    Assessment: s/p Procedure(s): SUPRACERVICAL HYSTERECTOMY ABDOMINAL W/ BILATERAL SALPINGECTOMY AND LEFT OOPHORECTOMY (Bilateral): stable, progressing well and tolerating diet  Plan: Advance diet Encourage ambulation Advance to PO medication Discharge home tomorrow if remains stable  LOS: 1 day    Verita Schneiders, MD 08/16/2017, 8:02 AM

## 2017-08-17 NOTE — Discharge Summary (Signed)
OB Discharge Summary     Patient Name: [name] DOB: Nov 14, 1965 MRN: [id number]  Date of admission: 08/15/2017  Date of discharge: 08/17/2017  Admitting diagnosis: Fibroids Pelvic Pain AUB Adenomyosis Intrauterine pregnancy: Unknown     Secondary diagnosis:  Principal Problem:   S/P abdominal supracervical hysterectomy, bilateral salpingectomy, left oophorectomy Active Problems:   Abnormal uterine bleeding (AUB)   Chronic female pelvic pain      Discharge diagnosis: s/p supracervical hysterectomy                                                                                                 Hospital course:  Patient admitted for scheduled supracervical hysterectomy for the management of AUB (see operative report for details). Patient had an uncomplicated postoperative course. She tolerated a regular diet. She ambulated. She passed flatus and had a bowel movement on day of discharge. She was found stable for discharge on POD#2. Discharge instructions were reviewed.   Physical exam  Vitals:   08/16/17 1628 08/16/17 2003 08/16/17 2335 08/17/17 0846  BP: 134/72 (!) 143/79 138/69 (!) 117/55  Pulse: (!) 58 62 67 (!) 56  Resp: 16 17 18 16   Temp: 99.8 F (37.7 C) 99.2 F (37.3 C) 99.3 F (37.4 C) 98.4 F (36.9 C)  TempSrc: Oral Oral Oral Oral  SpO2: 98% 100% 98% 99%  Weight:      Height:       General: alert, cooperative and no distress Lungs: CTA b/l Heart: regular rate and rhythm Abdomen: soft, appropriately tender, non distended. positive BS Incision: honey comb dressing clean, dry and intact DVT Evaluation: No evidence of DVT seen on physical exam. No cords or calf tenderness. Labs: Lab Results  Component Value Date   WBC 8.2 08/16/2017   HGB 11.2 (L) 08/16/2017   HCT 33.1 (L) 08/16/2017   MCV 92.5 08/16/2017   PLT 262 08/16/2017   CMP Latest Ref Rng & Units 07/26/2017  Glucose 65 - 99 mg/dL 133(H)  BUN 6 - 24 mg/dL 13  Creatinine 0.57 - 1.00 mg/dL 0.87   Sodium 134 - 144 mmol/L 142  Potassium 3.5 - 5.2 mmol/L 4.5  Chloride 96 - 106 mmol/L 104  CO2 20 - 29 mmol/L 24  Calcium 8.7 - 10.2 mg/dL 9.2  Total Protein 6.0 - 8.5 g/dL 7.4  Total Bilirubin 0.0 - 1.2 mg/dL 0.4  Alkaline Phos 39 - 117 IU/L 42  AST 0 - 40 IU/L 16  ALT 0 - 32 IU/L 15    Discharge instruction: per After Visit Summary and "Baby and Me Booklet".  After visit meds:  Allergies as of 08/17/2017      Reactions   Amitriptyline    Abnormal behavior. Just doesn't feel like normal self    Fluticasone-salmeterol Other (See Comments)   Thrush even with mouth rinsing; worsens cough   Penicillins Itching, Swelling   Has patient had a PCN reaction causing immediate rash, facial/tongue/throat swelling, SOB or lightheadedness with hypotension: No Has patient had a PCN reaction causing severe rash involving mucus membranes or skin necrosis: No Has patient  had a PCN reaction that required hospitalization: No Has patient had a PCN reaction occurring within the last 10 years: Yes If all of the above answers are "NO", then may proceed with Cephalosporin use.   Ciprofloxacin Itching, Rash   Macrobid [nitrofurantoin Monohyd Macro] Itching   Sulfamethoxazole-trimethoprim Rash      Medication List    TAKE these medications   AEROCHAMBER MV inhaler Use as instructed   AIMOVIG 70 MG/ML Soaj Generic drug:  Erenumab-aooe Inject 70 mg into the skin.   albuterol (2.5 MG/3ML) 0.083% nebulizer solution Commonly known as:  PROVENTIL Take 3 mLs (2.5 mg total) by nebulization every 4 (four) hours as needed. What changed:  Another medication with the same name was changed. Make sure you understand how and when to take each.   albuterol 108 (90 Base) MCG/ACT inhaler Commonly known as:  PROVENTIL HFA;VENTOLIN HFA 2 puffs every 3-4 hours as needed for shortness of breath\wheezing\recurrent cough What changed:  how much to take  how to take this  when to take this  reasons to take  this  additional instructions   baclofen 10 MG tablet Commonly known as:  LIORESAL Take 1 tablet (10 mg total) by mouth 3 (three) times daily as needed (headaches).   BIOTIN PO Take 2,000 mg by mouth daily.   cetirizine 10 MG tablet Commonly known as:  ZYRTEC Take 1 tablet (10 mg total) by mouth daily.   diclofenac 75 MG EC tablet Commonly known as:  VOLTAREN Take 1 tablet (75 mg total) by mouth 2 (two) times daily with a meal.   docusate sodium 100 MG capsule Commonly known as:  COLACE Take 1 capsule (100 mg total) by mouth daily as needed for mild constipation.   eletriptan 40 MG tablet Commonly known as:  RELPAX Take 1 tablet (40 mg total) by mouth as needed for migraine or headache. May repeat in 2 hours if headache persists or recurs.   escitalopram 20 MG tablet Commonly known as:  LEXAPRO Take 1 tablet (20 mg total) by mouth daily.   esomeprazole 40 MG capsule Commonly known as:  NEXIUM TAKE 1 CAPSULE BY MOUTH ONCE DAILY   estrogen (conjugated)-medroxyprogesterone 0.3-1.5 MG tablet Commonly known as:  PREMPRO Take 1 tablet by mouth daily.   fluticasone 50 MCG/ACT nasal spray Commonly known as:  FLONASE PLACE 2 SPRAYS INTO THE NOSE DAILY.   furosemide 20 MG tablet Commonly known as:  LASIX TAKE 1 TABLET BY MOUTH DAILY.   gabapentin 300 MG capsule Commonly known as:  NEURONTIN Take 1 capsule (300 mg total) by mouth every 8 (eight) hours as needed (PAIN). Can take 600 mg at night prn hot flashes What changed:  reasons to take this  additional instructions   losartan 50 MG tablet Commonly known as:  COZAAR TAKE 1 TABLET BY MOUTH ONCE DAILY   mometasone-formoterol 200-5 MCG/ACT Aero Commonly known as:  DULERA Inhale 2 puffs into the lungs 2 (two) times daily.   montelukast 10 MG tablet Commonly known as:  SINGULAIR Take 1 tablet (10 mg total) by mouth at bedtime.   multivitamin with minerals Tabs tablet Take 1 tablet by mouth daily.   naproxen  500 MG tablet Commonly known as:  NAPROSYN Take 1 tablet (500 mg total) by mouth 2 (two) times daily as needed for mild pain or moderate pain. What changed:  when to take this  reasons to take this   ondansetron 4 MG tablet Commonly known as:  ZOFRAN Take 1  tablet (4 mg total) by mouth every 8 (eight) hours as needed for nausea or vomiting.   oxybutynin 10 MG 24 hr tablet Commonly known as:  DITROPAN-XL Take 1 tablet (10 mg total) by mouth at bedtime.   oxyCODONE-acetaminophen 5-325 MG tablet Commonly known as:  PERCOCET/ROXICET Take 1-2 tablets by mouth every 6 (six) hours as needed for severe pain (moderate to severe pain (when tolerating fluids)).   propranolol ER 80 MG 24 hr capsule Commonly known as:  INDERAL LA Take 1 capsule (80 mg total) by mouth daily.   simvastatin 40 MG tablet Commonly known as:  ZOCOR TAKE 1 TABLET BY MOUTH AT BEDTIME.   SUMAtriptan 50 MG tablet Commonly known as:  IMITREX Take 50 mg by mouth every 2 (two) hours as needed for migraine. May repeat in 2 hours if headache persists or recurs.   Tiotropium Bromide Monohydrate 2.5 MCG/ACT Aers Commonly known as:  SPIRIVA RESPIMAT Inhale 2 puffs into the lungs daily.   traMADol 50 MG tablet Commonly known as:  ULTRAM Take 2 tablets (100 mg total) by mouth every 6 (six) hours as needed. What changed:  reasons to take this   VITAMIN B-6 PO Take 1 tablet by mouth daily. Unknown OTC strength   vitamin C 500 MG tablet Commonly known as:  ASCORBIC ACID Take 500 mg by mouth daily.   ZIPSOR 25 MG Caps Generic drug:  Diclofenac Potassium Take 25 mg by mouth daily.       Diet: routine diet  Activity: Advance as tolerated. Pelvic rest for 6 weeks.   Outpatient follow up: scheduled for post op visit Follow up Appt:Future Appointments Date Time Provider [place]  08/19/2017 8:45 AM Larey Days, PT [facility]  08/22/2017 9:30 AM Selinda Eon, PT [facility]   08/26/2017 8:45 AM Larey Days, PT [facility] [place]  08/28/2017 8:45 AM Larey Days, PT [facility]  09/02/2017 9:30 AM Selinda Eon, PT [facility]  09/04/2017 9:30 AM Larey Days, PT [facility] [place]  09/09/2017 8:45 AM Larey Days, PT [facility] [place]  09/10/2017 3:00 PM Melvenia Beam, MD [facility] None  09/11/2017 8:45 AM Selinda Eon, PT [facility] [place]  09/11/2017 1:00 PM Anyanwu, Sallyanne Havers, MD [place]     08/17/2017 Mora Bellman, MD

## 2017-08-18 LAB — BPAM RBC
BLOOD PRODUCT EXPIRATION DATE: 201811022359
BLOOD PRODUCT EXPIRATION DATE: 201811022359
Blood Product Expiration Date: 201811112359
Blood Product Expiration Date: 201811112359
ISSUE DATE / TIME: 201810210633
ISSUE DATE / TIME: 201810210959
Unit Type and Rh: 6200
Unit Type and Rh: 6200
Unit Type and Rh: 6200
Unit Type and Rh: 6200

## 2017-08-18 LAB — TYPE AND SCREEN
ABO/RH(D): AB POS
Antibody Screen: NEGATIVE
UNIT DIVISION: 0
UNIT DIVISION: 0
Unit division: 0
Unit division: 0

## 2017-08-19 ENCOUNTER — Encounter: Payer: Self-pay | Admitting: Physical Therapy

## 2017-08-19 ENCOUNTER — Ambulatory Visit: Payer: 59 | Admitting: Physical Therapy

## 2017-08-19 ENCOUNTER — Telehealth: Payer: Self-pay | Admitting: *Deleted

## 2017-08-19 DIAGNOSIS — R51 Headache: Secondary | ICD-10-CM | POA: Diagnosis not present

## 2017-08-19 DIAGNOSIS — R293 Abnormal posture: Secondary | ICD-10-CM

## 2017-08-19 DIAGNOSIS — M62838 Other muscle spasm: Secondary | ICD-10-CM | POA: Diagnosis not present

## 2017-08-19 DIAGNOSIS — R519 Headache, unspecified: Secondary | ICD-10-CM

## 2017-08-19 DIAGNOSIS — M542 Cervicalgia: Secondary | ICD-10-CM | POA: Diagnosis not present

## 2017-08-19 NOTE — Therapy (Addendum)
[place], [place], [address] Phone: [phone]   Fax:  [phone]  Physical Therapy Treatment / Discharge summary  Patient Details  Name: [name] MRN: [id number] Date of Birth: [birth year] Referring Provider: Melvenia Beam, MD  Encounter Date: 08/19/2017      PT End of Session - 08/19/17 0856    Visit Number 2   Number of Visits 13   Date for PT Re-Evaluation 09/18/17   PT Start Time 0851   PT Stop Time 0940   PT Time Calculation (min) 49 min   Activity Tolerance Patient tolerated treatment well   Behavior During Therapy [facility] for tasks assessed/performed      Past Medical History:  Diagnosis Date  . Anemia   . Anxiety   . Asthma   . COPD (chronic obstructive pulmonary disease) ([place])   . Depression   . Dysrhythmia    Irregular heat w/ asthma episodes and anxiety  . GERD (gastroesophageal reflux disease)   . Hypertension   . Migraines   . PONV (postoperative nausea and vomiting)   . Pre-diabetes   . Sleep apnea    uses CPAP  . Urinary incontinence    frequent urination- tx w/ vesicare  . Urinary urgency     Past Surgical History:  Procedure Laterality Date  . ABDOMINAL HYSTERECTOMY Bilateral 08/15/2017   Procedure: SUPRACERVICAL HYSTERECTOMY ABDOMINAL W/ BILATERAL SALPINGECTOMY AND LEFT OOPHORECTOMY;  Surgeon: Osborne Oman, MD;  Location: [facility];  Service: Gynecology;  Laterality: Bilateral;  . ABLATION    . CESAREAN SECTION     x3  . DILATION AND CURETTAGE OF UTERUS  05/31/2005   MAB, TAB x 2  . HERNIA REPAIR  1990's  . HYSTEROSCOPY W/ ENDOMETRIAL ABLATION    . LEEP    . MAB     x 4  . MOUTH SURGERY     teeth extraction  . TUBAL LIGATION  11/01/2006  . WISDOM TOOTH EXTRACTION      There were no vitals filed for this visit.      Subjective Assessment - 08/19/17 0854    Subjective "I had Fibroids removal surgery 2 days ago, I did think the DN helped and I haven't  had a HA for about 2 days"    Currently in Pain? Yes   Pain Score 3    Pain Location Neck   Pain Orientation Right   Pain Descriptors / Indicators Aching   Pain Type Surgical pain   Pain Onset More than a month ago   Pain Frequency Intermittent   Aggravating Factors  looking to the L/R   Pain Relieving Factors DN, exercise, stretching                         [facility] Adult PT Treatment/Exercise - 08/19/17 0923      Neck Exercises: Supine   Neck Retraction 10 reps  x 10 sec hold     Moist Heat Therapy   Number Minutes Moist Heat 10 Minutes   Moist Heat Location Cervical  in supine     Manual Therapy   Manual Therapy Joint mobilization   Manual therapy comments sub-occipital release 2 x 30 sec, MTPR over the upper trap. levator scap bil.    Joint Mobilization cervical C3-C7 PA, and bil lateral gapping grade 3   Soft tissue mobilization IASTM over bil sub-occipitals and bil upper trap  PT Education - 08/19/17 [id number]    Education provided Yes   Education Details how to perform sub-occipital release using tennis balls.    Person(s) Educated Patient   Methods Explanation;Verbal cues   Comprehension Verbalized understanding;Verbal cues required          PT Short Term Goals - 08/07/17 1203      PT SHORT TERM GOAL #1   Title pt to be I with inital HEP    Time 3   Period Weeks   Status New   Target Date 08/28/17     PT SHORT TERM GOAL #2   Title pt to verbalize/ demo proper posture with sitting/ standing and lifting/ carrying mechanics to prevent/ reduce neck pain    Time 3   Period Weeks   Status New   Target Date 08/28/17     PT SHORT TERM GOAL #3   Title Reduce cervical spasm to promote neck mobility and reduce pain to </= 6/10    Time 3   Period Weeks   Status New   Target Date 08/28/17           PT Long Term Goals - 08/07/17 1204      PT LONG TERM GOAL #1   Title increase cervical flexion and bil rotation to >/= 50  degrees, extension / bil side bending to >/= 18 degrees to promote functional moblity for driving and ADLs    Time 6   Period Weeks   Status New   Target Date 09/18/17     PT LONG TERM GOAL #2   Title pt to report no migraines for >/= 1 week to demo improvement in condition    Time 6   Period Weeks   Status New   Target Date 09/18/17     PT LONG TERM GOAL #3   Title increase FOTO score to >/= 38% limited to demo improvement in function    Time 6   Period Weeks   Status New   Target Date 09/18/17     PT LONG TERM GOAL #4   Title pt to be I with all HEP given as of last visit    Time 6   Period Weeks   Status New   Target Date 09/18/17               Plan - 08/19/17 2620    Clinical Impression Statement Focused primarily on manual techniques today due to pt having fibroid removal 2 days ago. following sub-occiptial release and cervical mobs pt reported pain dropped from 2/10 to 0. MHp post session for muscle tightness.    PT Next Visit Plan review and update HEP PRN, response to DN, soft tissue/ cervical mobs, thoracic mobility exercise/ scapular stability, modalities PRN   PT Home Exercise Plan chin tuck, upper trap / levator stretching Rows.    Consulted and Agree with Plan of Care Patient      Patient will benefit from skilled therapeutic intervention in order to improve the following deficits and impairments:  Pain, Decreased endurance, Increased muscle spasms, Increased fascial restricitons, Decreased mobility, Postural dysfunction, Improper body mechanics, Decreased range of motion, Decreased activity tolerance  Visit Diagnosis: Chronic intractable headache, unspecified headache type  Cervicalgia  Other muscle spasm  Abnormal posture     Problem List Patient Active Problem List   Diagnosis Date Noted  . Abnormal uterine bleeding (AUB) 08/15/2017  . Chronic female pelvic pain 08/15/2017  . S/P abdominal supracervical hysterectomy, bilateral  salpingectomy, left oophorectomy 08/15/2017  . Chronic migraine without aura with status migrainosus, not intractable 07/26/2017  . Sinusitis 02/25/2017  . Left knee pain 08/29/2016  . Bunion of great toe of right foot 08/29/2016  . Preoperative cardiovascular examination 08/01/2016  . Abdominal cramping 12/30/2015  . Bladder spasms 12/30/2015  . Pre-diabetes 10/03/2015  . HTN (hypertension) 10/03/2015  . Fatigue 07/21/2015  . Urinary urgency 07/06/2015  . Flank pain 07/06/2015  . Hot flashes 10/14/2014  . Vitamin D deficiency 11/25/2013  . Cough 09/10/2013  . Back pain 06/30/2013  . Migraine 06/07/2013  . Knee pain, bilateral 10/09/2012  . Obstructive sleep apnea 09/08/2010  . HLD (hyperlipidemia) 09/20/2008  . Depression with anxiety 12/26/2006  . RHINITIS, ALLERGIC 12/26/2006  . Asthma 12/26/2006    Starr Lake PT, DPT, LAT, ATC  08/19/17  9:31 AM      [facility] [address] [place], [place], [address] Phone: [phone]   Fax:  [phone]  Name: [name] MRN: [id number] Date of Birth: 08/10/1966       PHYSICAL THERAPY DISCHARGE SUMMARY  Visits from Start of Care: 2  Current functional level related to goals / functional outcomes: See goals   Remaining deficits: Unknown due to multiple missed appointments   Education / Equipment: HEP, posture education  Plan: Patient agrees to discharge.  Patient goals were not met. Patient is being discharged due to not returning since the last visit.  ?????           Daxten Kovalenko PT, DPT, LAT, ATC  09/04/17  1:03 PM

## 2017-08-19 NOTE — Telephone Encounter (Signed)
[name] called front desk and call transferred to nurse. [name] had surgery 08/15/17 Suprvacervical abd hysterectomy, Bilateral salpingectomy, left oopherectomy.  She c/o pain when she coughs or sneezes- afraid she has split open. She states she still has her dressing on which she is to take off 08/22/17. She states there has not been any new bleeding on her dressing and it only has a little dried old drainage. She states she was having constipation but now is having bm's but she is trying not to take much narcotics. We discussed taking ibuprofen every 8 hours to lessen need for narcotics, and drinking water to decrease chance of constipation, using pillow to splint abdomen . We discussed normal to have pain still since so recently had surgery. Encouraged to take tylenol if desired but not if taking percocet- to not exceed recommended daily dosage of tylenol. Instructed to call us back if has bleeding from incision, fever , etc or go mau. She also c/o cough same as she had in hospital, can't cough up anything- instructed to take mucinex or robitussin.

## 2017-08-21 ENCOUNTER — Other Ambulatory Visit: Payer: Self-pay | Admitting: *Deleted

## 2017-08-21 ENCOUNTER — Encounter: Payer: Self-pay | Admitting: *Deleted

## 2017-08-21 NOTE — Patient Outreach (Addendum)
[facility]) Care Management  08/21/2017  [name] 19-Jul-1966 [id number]   Subjective: Telephone call to patient's home / mobile number, spoke with patient, and HIPAA verified.  Discussed [facility] Care Management [facility] Transition of care follow up, patient voiced understanding, and is in agreement to follow up.  Patient states she is doing fine, a little sore, having night sweats, having right leg pain has been reported to MD, and she received recommendation from MD's office this morning.  States she has a follow up appointment with surgeon on 09/11/17 and will continue to see other specialist as scheduled. Patient voices understanding of medical diagnosis, surgery,  and treatment plan. States she is accessing the following [facility] benefits: outpatient pharmacy, hospital indemnity, and  family medical leave act (FMLA) process initated.  States her prediabetes is being self managed with diet, advised of [employer] / Link to [employer] for [employer], patient voices understanding, states she will review information packet, and enroll if needed.  Patient states she does not have any education material, transition of care, care coordination, disease management, disease monitoring, transportation, community resource, or pharmacy needs at this time. States she is very appreciative of the follow up and is in agreement to receive [facility] information.    Objective: Per KPN (Knowledge Performance Now, point of care tool) and chart review, patient hospitalized 08/15/17 - 08/17/17 for Abnormal uterine bleeding.   Status post Supracervical abdominal hysterectomy, Bilateral Salpingectomy, Left oophorectomy on 08/15/17.   Had ED visit for migraine on 07/20/17.  Patient also has a history of Asthma, COPD, hypertension, Migraines, pre-diabetes, and sleep apnea (uses CPAP).    Assessment: Received [facility] Transition of care referral on 08/20/17.   Transition of care follow up completed, no  care management needs, and will proceed with case closure.     Plan: RNCM will send patient successful outreach letter, [facility] Link to [employer], [facility] pamphlet, and magnet. RNCM will send case closure due to follow up completed / no care management needs request to [facility].    Briza Bark H. Annia Friendly, BSN, [place] Management [facility] Telephonic CM Phone: [phone] Fax: [phone]

## 2017-08-22 ENCOUNTER — Ambulatory Visit: Payer: 59 | Admitting: Physical Therapy

## 2017-08-23 ENCOUNTER — Telehealth: Payer: Self-pay | Admitting: General Practice

## 2017-08-23 NOTE — Telephone Encounter (Signed)
Patient called and left message stating she just took her dressing off but is having a lot of pain in her incision. Called patient, no answer- left message stating we are trying to reach you to return your phone call and wanted to see how you are doing, please call us back

## 2017-08-26 ENCOUNTER — Ambulatory Visit: Payer: 59 | Admitting: Physical Therapy

## 2017-08-27 NOTE — Telephone Encounter (Signed)
Returned call from nurse.

## 2017-08-28 ENCOUNTER — Ambulatory Visit: Payer: 59 | Admitting: Physical Therapy

## 2017-08-30 ENCOUNTER — Telehealth: Payer: Self-pay | Admitting: Advanced Practice Midwife

## 2017-08-30 ENCOUNTER — Inpatient Hospital Stay (HOSPITAL_COMMUNITY)
Admission: AD | Admit: 2017-08-30 | Discharge: 2017-08-30 | Disposition: A | Discharge status: Home / Self Care | Payer: 59 | Source: Ambulatory Visit | Attending: Obstetrics & Gynecology | Admitting: Obstetrics & Gynecology

## 2017-08-30 ENCOUNTER — Encounter (HOSPITAL_COMMUNITY): Payer: Self-pay | Admitting: *Deleted

## 2017-08-30 DIAGNOSIS — Z9851 Tubal ligation status: Secondary | ICD-10-CM | POA: Diagnosis not present

## 2017-08-30 DIAGNOSIS — J449 Chronic obstructive pulmonary disease, unspecified: Secondary | ICD-10-CM | POA: Insufficient documentation

## 2017-08-30 DIAGNOSIS — Z87891 Personal history of nicotine dependence: Secondary | ICD-10-CM | POA: Diagnosis not present

## 2017-08-30 DIAGNOSIS — Y92009 Unspecified place in unspecified non-institutional (private) residence as the place of occurrence of the external cause: Secondary | ICD-10-CM

## 2017-08-30 DIAGNOSIS — Z9889 Other specified postprocedural states: Secondary | ICD-10-CM | POA: Diagnosis not present

## 2017-08-30 DIAGNOSIS — Z79899 Other long term (current) drug therapy: Secondary | ICD-10-CM | POA: Diagnosis not present

## 2017-08-30 DIAGNOSIS — S31109A Unspecified open wound of abdominal wall, unspecified quadrant without penetration into peritoneal cavity, initial encounter: Secondary | ICD-10-CM | POA: Diagnosis not present

## 2017-08-30 DIAGNOSIS — Z888 Allergy status to other drugs, medicaments and biological substances status: Secondary | ICD-10-CM | POA: Diagnosis not present

## 2017-08-30 DIAGNOSIS — W19XXXA Unspecified fall, initial encounter: Secondary | ICD-10-CM

## 2017-08-30 DIAGNOSIS — X58XXXA Exposure to other specified factors, initial encounter: Secondary | ICD-10-CM | POA: Diagnosis not present

## 2017-08-30 DIAGNOSIS — Z882 Allergy status to sulfonamides status: Secondary | ICD-10-CM | POA: Insufficient documentation

## 2017-08-30 DIAGNOSIS — R109 Unspecified abdominal pain: Secondary | ICD-10-CM | POA: Insufficient documentation

## 2017-08-30 DIAGNOSIS — M549 Dorsalgia, unspecified: Secondary | ICD-10-CM | POA: Diagnosis not present

## 2017-08-30 DIAGNOSIS — Z880 Allergy status to penicillin: Secondary | ICD-10-CM | POA: Insufficient documentation

## 2017-08-30 DIAGNOSIS — I10 Essential (primary) hypertension: Secondary | ICD-10-CM | POA: Diagnosis not present

## 2017-08-30 DIAGNOSIS — N951 Menopausal and female climacteric states: Secondary | ICD-10-CM

## 2017-08-30 DIAGNOSIS — G8918 Other acute postprocedural pain: Secondary | ICD-10-CM | POA: Diagnosis not present

## 2017-08-30 DIAGNOSIS — Z90710 Acquired absence of both cervix and uterus: Secondary | ICD-10-CM | POA: Diagnosis not present

## 2017-08-30 DIAGNOSIS — T8131XA Disruption of external operation (surgical) wound, not elsewhere classified, initial encounter: Secondary | ICD-10-CM

## 2017-08-30 LAB — CBC
HCT: 37.9 % (ref 36.0–46.0)
HEMOGLOBIN: 12.9 g/dL (ref 12.0–15.0)
MCH: 31.6 pg (ref 26.0–34.0)
MCHC: 34 g/dL (ref 30.0–36.0)
MCV: 92.9 fL (ref 78.0–100.0)
Platelets: 314 10*3/uL (ref 150–400)
RBC: 4.08 MIL/uL (ref 3.87–5.11)
RDW: 12.8 % (ref 11.5–15.5)
WBC: 4.5 10*3/uL (ref 4.0–10.5)

## 2017-08-30 MED ORDER — EST ESTROGENS-METHYLTEST 0.625-1.25 MG PO TABS: 1.0000 | ORAL_TABLET | Freq: Every day | ORAL | 2 refills | Status: DC

## 2017-08-30 MED ORDER — CYCLOBENZAPRINE HCL 10 MG PO TABS: 5.0000 mg | ORAL_TABLET | Freq: Three times a day (TID) | ORAL | 0 refills | Status: DC | PRN

## 2017-08-30 MED ORDER — CYCLOBENZAPRINE HCL 5 MG PO TABS
5.0000 mg | ORAL_TABLET | Freq: Once | ORAL | Status: AC
  Administered 2017-08-30: 5 mg via ORAL
  Filled 2017-08-30: qty 1

## 2017-08-30 MED FILL — CYCLOBENZAPRINE 10 MG TAB: 10 | 10 days supply | Qty: 30 | Fill #0

## 2017-08-30 MED FILL — ESTROGEN-METHYLTESTOS H.S.: 0.625-1.25 | 30 days supply | Qty: 30 | Fill #0

## 2017-08-30 NOTE — MAU Provider Note (Signed)
Chief Complaint: Wound Check   First Provider Initiated Contact with Patient 08/30/17 1035      SUBJECTIVE HPI: [name] is a 51 y.o. [id number] who is 2 weeks s/p abdominal hysterectomy w/ bilateral salpingectomy and left oophorectomy who fell this morning over her dog and now is having more pain and her incision are slightly open and draining.  She reports her incisions were closed yesterday and her pain was well managed with PO Toradol. She does not like narcotic medication so did not take the oxycodone prescribed after her surgery.  She also reports significant hot flashes since the surgery, not improved with her PO estrogen tablets.  This morning she was sweating, which was causing her incisions to burn. This is what caused her to examine them and notice they were more open this morning after her fall. The hot flashes occur untriggered but are also triggered by showers and having bowel movements. Sometimes the hot flashes are associated with n/v and feeling like she might pass out. She has not lost consciousness. There are no other associated symptoms. She is taking Toradol for pain and Prempro tablets for hormone replacement but these are not helping her symptoms. She denies vaginal bleeding, vaginal itching/burning, urinary symptoms, h/a, or fever/chills.     HPI  Past Medical History:  Diagnosis Date  . Anemia   . Anxiety   . Asthma   . COPD (chronic obstructive pulmonary disease) ([place])   . Depression   . Dysrhythmia    Irregular heat w/ asthma episodes and anxiety  . GERD (gastroesophageal reflux disease)   . Hypertension   . Migraines   . PONV (postoperative nausea and vomiting)   . Pre-diabetes   . Sleep apnea    uses CPAP  . Urinary incontinence    frequent urination- tx w/ vesicare  . Urinary urgency    Past Surgical History:  Procedure Laterality Date  . ABDOMINAL HYSTERECTOMY Bilateral 08/15/2017   Procedure: SUPRACERVICAL HYSTERECTOMY ABDOMINAL W/ BILATERAL  SALPINGECTOMY AND LEFT OOPHORECTOMY;  Surgeon: Osborne Oman, MD;  Location: [facility];  Service: Gynecology;  Laterality: Bilateral;  . ABLATION    . CESAREAN SECTION     x3  . DILATION AND CURETTAGE OF UTERUS  05/31/2005   MAB, TAB x 2  . HERNIA REPAIR  1990's  . HYSTEROSCOPY W/ ENDOMETRIAL ABLATION    . LEEP    . MAB     x 4  . MOUTH SURGERY     teeth extraction  . TUBAL LIGATION  11/01/2006  . WISDOM TOOTH EXTRACTION     Social History   Social History  . Marital status: Single    Spouse name: N/A  . Number of children: N/A  . Years of education: N/A   Occupational History  . Not on file.   Social History Main Topics  . Smoking status: Former Smoker    Packs/day: 0.25    Years: 15.00    Types: Cigarettes    Quit date: 02/16/2003  . Smokeless tobacco: Never Used  . Alcohol use Yes     Comment: socially  . Drug use: No  . Sexual activity: Not Currently    Partners: Male    Birth control/ protection: Surgical   Other Topics Concern  . Not on file   Social History Narrative   ** Merged History Encounter **       No current facility-administered medications on file prior to encounter.    Current Outpatient Prescriptions on  File Prior to Encounter  Medication Sig Dispense Refill  . albuterol (PROVENTIL) (2.5 MG/3ML) 0.083% nebulizer solution Take 3 mLs (2.5 mg total) by nebulization every 4 (four) hours as needed. 75 mL 6  . BIOTIN PO Take 2,000 mg by mouth daily.    . cetirizine (ZYRTEC) 10 MG tablet Take 1 tablet (10 mg total) by mouth daily. 30 tablet 11  . Diclofenac Potassium (ZIPSOR) 25 MG CAPS Take 25 mg by mouth daily.    Marland Kitchen escitalopram (LEXAPRO) 20 MG tablet Take 1 tablet (20 mg total) by mouth daily. 30 tablet 3  . esomeprazole (NEXIUM) 40 MG capsule TAKE 1 CAPSULE BY MOUTH ONCE DAILY 30 capsule 6  . fluticasone (FLONASE) 50 MCG/ACT nasal spray PLACE 2 SPRAYS INTO THE NOSE DAILY. 16 g 6  . furosemide (LASIX) 20 MG tablet TAKE 1 TABLET BY MOUTH DAILY. 30  tablet 3  . gabapentin (NEURONTIN) 300 MG capsule Take 1 capsule (300 mg total) by mouth every 8 (eight) hours as needed (PAIN). Can take 600 mg at night prn hot flashes 90 capsule 3  . losartan (COZAAR) 50 MG tablet TAKE 1 TABLET BY MOUTH ONCE DAILY 30 tablet 1  . mometasone-formoterol (DULERA) 200-5 MCG/ACT AERO Inhale 2 puffs into the lungs 2 (two) times daily. 8.8 g 5  . montelukast (SINGULAIR) 10 MG tablet Take 1 tablet (10 mg total) by mouth at bedtime. 90 tablet 3  . Multiple Vitamin (MULTIVITAMIN WITH MINERALS) TABS tablet Take 1 tablet by mouth daily.    . propranolol ER (INDERAL LA) 80 MG 24 hr capsule Take 1 capsule (80 mg total) by mouth daily. 30 capsule 2  . Pyridoxine HCl (VITAMIN B-6 PO) Take 1 tablet by mouth daily. Unknown OTC strength    . simvastatin (ZOCOR) 40 MG tablet TAKE 1 TABLET BY MOUTH AT BEDTIME. 90 tablet 3  . Tiotropium Bromide Monohydrate (SPIRIVA RESPIMAT) 2.5 MCG/ACT AERS Inhale 2 puffs into the lungs daily. 4 g 3  . traMADol (ULTRAM) 50 MG tablet Take 2 tablets (100 mg total) by mouth every 6 (six) hours as needed. (Patient taking differently: Take 100 mg by mouth every 6 (six) hours as needed for moderate pain. ) 30 tablet 0  . vitamin C (ASCORBIC ACID) 500 MG tablet Take 500 mg by mouth daily.     . baclofen (LIORESAL) 10 MG tablet Take 1 tablet (10 mg total) by mouth 3 (three) times daily as needed (headaches). 60 each 1  . diclofenac (VOLTAREN) 75 MG EC tablet Take 1 tablet (75 mg total) by mouth 2 (two) times daily with a meal. 60 tablet 0  . eletriptan (RELPAX) 40 MG tablet Take 1 tablet (40 mg total) by mouth as needed for migraine or headache. May repeat in 2 hours if headache persists or recurs. 10 tablet 11  . Erenumab-aooe (AIMOVIG) 70 MG/ML SOAJ Inject 70 mg into the skin.    Marland Kitchen ondansetron (ZOFRAN) 4 MG tablet Take 1 tablet (4 mg total) by mouth every 8 (eight) hours as needed for nausea or vomiting. 6 tablet 0  . Spacer/Aero-Holding Chambers  (AEROCHAMBER MV) inhaler Use as instructed 1 each 0  . SUMAtriptan (IMITREX) 50 MG tablet Take 50 mg by mouth every 2 (two) hours as needed for migraine. May repeat in 2 hours if headache persists or recurs.    . [DISCONTINUED] medroxyPROGESTERone (DEPO-PROVERA) 150 MG/ML injection Inject 1 mL (150 mg total) into the muscle every 3 (three) months. 1 mL 0  . [DISCONTINUED]  medroxyPROGESTERone (PROVERA) 10 MG tablet Take 2 tablets (20 mg total) by mouth daily. 60 tablet 2   Allergies  Allergen Reactions  . Amitriptyline     Abnormal behavior. Just doesn't feel like normal self   . Fluticasone-Salmeterol Other (See Comments)    Thrush even with mouth rinsing; worsens cough  . Penicillins Itching and Swelling    Has patient had a PCN reaction causing immediate rash, facial/tongue/throat swelling, SOB or lightheadedness with hypotension: No Has patient had a PCN reaction causing severe rash involving mucus membranes or skin necrosis: No Has patient had a PCN reaction that required hospitalization: No Has patient had a PCN reaction occurring within the last 10 years: Yes If all of the above answers are "NO", then may proceed with Cephalosporin use.   . Ciprofloxacin Itching and Rash  . Macrobid [Nitrofurantoin Monohyd Macro] Itching  . Sulfamethoxazole-Trimethoprim Rash    ROS:  Review of Systems  Constitutional: Positive for diaphoresis. Negative for chills, fatigue and fever.  Respiratory: Negative for shortness of breath.   Cardiovascular: Negative for chest pain.  Gastrointestinal: Positive for abdominal pain and nausea. Negative for constipation, diarrhea and vomiting.  Genitourinary: Positive for pelvic pain. Negative for difficulty urinating, dysuria, flank pain, vaginal bleeding, vaginal discharge and vaginal pain.  Skin: Positive for wound.  Neurological: Positive for dizziness and light-headedness. Negative for headaches.  Psychiatric/Behavioral: Negative.      I have reviewed  patient's Past Medical Hx, Surgical Hx, Family Hx, Social Hx, medications and allergies.   Physical Exam   Patient Vitals for the past 24 hrs:  BP Temp Temp src Pulse Resp  08/30/17 1227 (!) 150/85 - - (!) 53 -  08/30/17 0936 124/70 (!) 97.5 F (36.4 C) Oral (!) 58 18   Constitutional: Well-developed, well-nourished female in no acute distress.  Cardiovascular: normal rate Respiratory: normal effort GI: Abd soft, non-tender. Pos BS x 4 Skin: bilateral laprascopic incisions on lower abdomen that are open ~1cm on each incision, with pink tissue that approximates well, scant amount serous drainage, no edema or erythema noted MS: Extremities nontender, no edema, normal ROM Neurologic: Alert and oriented x 4.  GU: Neg CVAT.   LAB RESULTS Results for orders placed or performed during the hospital encounter of 08/30/17 (from the past 24 hour(s))  CBC     Status: None   Collection Time: 08/30/17 10:47 AM  Result Value Ref Range   WBC 4.5 4.0 - 10.5 K/uL   RBC 4.08 3.87 - 5.11 MIL/uL   Hemoglobin 12.9 12.0 - 15.0 g/dL   HCT 37.9 36.0 - 46.0 %   MCV 92.9 78.0 - 100.0 fL   MCH 31.6 26.0 - 34.0 pg   MCHC 34.0 30.0 - 36.0 g/dL   RDW 12.8 11.5 - 15.5 %   Platelets 314 150 - 400 K/uL    --/--/AB POS, AB POS (10/11 1350)  IMAGING No results found.  MAU Management/MDM: Ordered labs and reviewed results.  No evidence of acute infection.  Incisions well appearing, mostly approximated with 0.5-1cm opening of each lower abdominal incision and normal drainage.  Betadine applied then steri-strip x 1 applied to each incision.  Pt tolerated well. Flexeril 5 mg x 1 dose in MAU with improved back pain. Rx for Flexeril for pt to use at home.  Dr Roselie Awkward unavailable for consult at time of discharge so will call pt back to discuss changes to HRT to improve hot flashes.  See telephone note with details of consult/change in  Rx. Pt discharged with strict infection/postop precautions.  ASSESSMENT 1.  Postoperative abdominal pain   2. Open abdominal incision with drainage, initial encounter   3. Fall at home, initial encounter   4. Musculoskeletal back pain   5. Hot flash, menopausal     PLAN Discharge home Allergies as of 08/30/2017      Reactions   Amitriptyline    Abnormal behavior. Just doesn't feel like normal self    Fluticasone-salmeterol Other (See Comments)   Thrush even with mouth rinsing; worsens cough   Penicillins Itching, Swelling   Has patient had a PCN reaction causing immediate rash, facial/tongue/throat swelling, SOB or lightheadedness with hypotension: No Has patient had a PCN reaction causing severe rash involving mucus membranes or skin necrosis: No Has patient had a PCN reaction that required hospitalization: No Has patient had a PCN reaction occurring within the last 10 years: Yes If all of the above answers are "NO", then may proceed with Cephalosporin use.   Ciprofloxacin Itching, Rash   Macrobid [nitrofurantoin Monohyd Macro] Itching   Sulfamethoxazole-trimethoprim Rash      Medication List    STOP taking these medications   docusate sodium 100 MG capsule Commonly known as:  COLACE   naproxen 500 MG tablet Commonly known as:  NAPROSYN   oxybutynin 10 MG 24 hr tablet Commonly known as:  DITROPAN-XL   oxyCODONE-acetaminophen 5-325 MG tablet Commonly known as:  PERCOCET/ROXICET     TAKE these medications   AEROCHAMBER MV inhaler Use as instructed   AIMOVIG 70 MG/ML Soaj Generic drug:  Erenumab-aooe Inject 70 mg into the skin.   albuterol (2.5 MG/3ML) 0.083% nebulizer solution Commonly known as:  PROVENTIL Take 3 mLs (2.5 mg total) by nebulization every 4 (four) hours as needed.   baclofen 10 MG tablet Commonly known as:  LIORESAL Take 1 tablet (10 mg total) by mouth 3 (three) times daily as needed (headaches).   BIOTIN PO Take 2,000 mg by mouth daily.   cetirizine 10 MG tablet Commonly known as:  ZYRTEC Take 1 tablet (10 mg  total) by mouth daily.   cyclobenzaprine 10 MG tablet Commonly known as:  FLEXERIL Take 0.5-1 tablets (5-10 mg total) by mouth every 8 (eight) hours as needed for muscle spasms.   diclofenac 75 MG EC tablet Commonly known as:  VOLTAREN Take 1 tablet (75 mg total) by mouth 2 (two) times daily with a meal.   eletriptan 40 MG tablet Commonly known as:  RELPAX Take 1 tablet (40 mg total) by mouth as needed for migraine or headache. May repeat in 2 hours if headache persists or recurs.   escitalopram 20 MG tablet Commonly known as:  LEXAPRO Take 1 tablet (20 mg total) by mouth daily.   esomeprazole 40 MG capsule Commonly known as:  NEXIUM TAKE 1 CAPSULE BY MOUTH ONCE DAILY   fluticasone 50 MCG/ACT nasal spray Commonly known as:  FLONASE PLACE 2 SPRAYS INTO THE NOSE DAILY.   furosemide 20 MG tablet Commonly known as:  LASIX TAKE 1 TABLET BY MOUTH DAILY.   gabapentin 300 MG capsule Commonly known as:  NEURONTIN Take 1 capsule (300 mg total) by mouth every 8 (eight) hours as needed (PAIN). Can take 600 mg at night prn hot flashes   losartan 50 MG tablet Commonly known as:  COZAAR TAKE 1 TABLET BY MOUTH ONCE DAILY   megestrol 40 MG tablet Commonly known as:  MEGACE Take 40 mg by mouth daily.   mometasone-formoterol 200-5 MCG/ACT Aero  Commonly known as:  DULERA Inhale 2 puffs into the lungs 2 (two) times daily.   montelukast 10 MG tablet Commonly known as:  SINGULAIR Take 1 tablet (10 mg total) by mouth at bedtime.   multivitamin with minerals Tabs tablet Take 1 tablet by mouth daily.   ondansetron 4 MG tablet Commonly known as:  ZOFRAN Take 1 tablet (4 mg total) by mouth every 8 (eight) hours as needed for nausea or vomiting.   propranolol ER 80 MG 24 hr capsule Commonly known as:  INDERAL LA Take 1 capsule (80 mg total) by mouth daily.   simvastatin 40 MG tablet Commonly known as:  ZOCOR TAKE 1 TABLET BY MOUTH AT BEDTIME.   SUMAtriptan 50 MG tablet Commonly  known as:  IMITREX Take 50 mg by mouth every 2 (two) hours as needed for migraine. May repeat in 2 hours if headache persists or recurs.   Tiotropium Bromide Monohydrate 2.5 MCG/ACT Aers Commonly known as:  SPIRIVA RESPIMAT Inhale 2 puffs into the lungs daily.   traMADol 50 MG tablet Commonly known as:  ULTRAM Take 2 tablets (100 mg total) by mouth every 6 (six) hours as needed. What changed:  reasons to take this   VITAMIN B-6 PO Take 1 tablet by mouth daily. Unknown OTC strength   vitamin C 500 MG tablet Commonly known as:  ASCORBIC ACID Take 500 mg by mouth daily.   ZIPSOR 25 MG Caps Generic drug:  Diclofenac Potassium Take 25 mg by mouth daily.      Follow-up Information    Anyanwu, Sallyanne Havers, MD Follow up.   Specialty:  Obstetrics and Gynecology Why:  As scheduled, or call the office for sooner appointment with OB/Gyn provider. Return to MAU as needed for emergencies. Contact information: [place] [address] [place] Certified Nurse-Midwife 08/30/2017  4:40 PM

## 2017-08-30 NOTE — Telephone Encounter (Signed)
Called pt to notify her of medication change.  After consulting Dr Roselie Awkward about pt symptoms, Rx changed from Prempro (estrogen/progesterone) to Estratest hs (estrogen/testosterone).  Medication sent to pt pharmacy. Pt to follow up with [facility] next week if symptoms not improved. Return to MAU as needed for emergencies.

## 2017-08-30 NOTE — MAU Note (Signed)
Pt had partial hysterectomy on 10/18, every time she coughs or sneezes it feels like her incision is ripping open.  She tripped over her dog & fell this morning, states she has open areas in incision now.  Having intense hot flashes.  Incision has foul odor, no drainage noted.

## 2017-09-02 ENCOUNTER — Ambulatory Visit: Payer: 59 | Attending: Neurology | Admitting: Physical Therapy

## 2017-09-02 ENCOUNTER — Telehealth: Payer: Self-pay | Admitting: Physical Therapy

## 2017-09-02 ENCOUNTER — Encounter: Payer: Self-pay | Admitting: *Deleted

## 2017-09-02 NOTE — Telephone Encounter (Signed)
Left message regarding no show appointment today and advised of next appointment. Requested call back if she is not going to attend.  Shulem Mader C. Pasqualina Colasurdo PT, DPT 09/02/17 4:03 PM

## 2017-09-02 NOTE — Telephone Encounter (Signed)
This encounter was created in error - please disregard.

## 2017-09-03 ENCOUNTER — Other Ambulatory Visit: Payer: Self-pay | Admitting: *Deleted

## 2017-09-03 NOTE — Patient Outreach (Signed)
[facility]) Care Management  09/03/2017  [name] 1965-12-08 [id number]   Subjective: Received voicemail message from Ms. Hughes Better, states she remembers speaking with this RNCM recently, and is requesting call back regarding where to send paperwork. States she is not sure whether she will need to bring paperwork to the office or fax it.  Telephone call to [facility] at [facility], states she spoke with patient on 09/02/17 and patient stated that she would bring the completed  Link to Wellness paperwork to [place] Management office for processing.  Telephone call to patient's home / mobile number, spoke with patient, and HIPAA verified.  States she is heading to the [place] Management office to drop off her paperwork now and needed to verify the office location.    Sent secure update to [facility] and Freda Jackson at [place] Management.    Objective: Per KPN (Knowledge Performance Now, point of care tool) and chart review, patient hospitalized 08/15/17 - 08/17/17 for Abnormal uterine bleeding.   Status post Supracervicalabdominal hysterectomy, Bilateral Salpingectomy, Left oophorectomy on 08/15/17.   Had ED visit for migraine on 07/20/17.  Patient also has a history of Asthma, COPD, hypertension, Migraines, pre-diabetes, and sleep apnea (uses CPAP).    Assessment: Received [facility] Transition of care referral on 08/20/17.   Transition of care follow up completed, [facility] Consult completed, no care management needs, and case will remain closed.     Plan: Case will remain closed.      Tesneem Dufrane H. Annia Friendly, BSN, [place] [facility] Telephonic CM Phone: [phone] Fax: [phone]

## 2017-09-04 ENCOUNTER — Ambulatory Visit: Payer: 59 | Admitting: Physical Therapy

## 2017-09-04 NOTE — Progress Notes (Signed)
   Subjective:    Patient ID: [name], female    DOB: 1966-03-14, 51 y.o.   MRN: [id number]   CC: Leg pain & concern about incision   HPI: Leg pain  Patient reports leg pain and cramping. Unable to assess when pain started, as pain has been around for "a long time". States pain occurred before surgery, and has not changed since recent fall last Friday when she tripped over her dog. States pain/cramping occurs at nighttime when she is trying to sleep. States that the only thing that relieves pain is moving her legs or walking around. States that pain wakes her up from sleep and she is not able to sleep well due to pain. States that she wears ted hose socks during the day and notes no pain during the day. States pain is described as a dull pain that goes all the way from top of her legs to her calves. Patient has tried tylenol with no relief. Laying still makes pain worse. States she has increased her potassium intake by eating bananas with some relief. Patient denies edema. Previous Doppler studies (2014 and 2015) were negative.   Incision Patient is concerned about her incision site from recent abdominal hysterectomy on 08/15/2017. Patient states minimal clear drainage from right side of incision scar but denies pus or odor. States she washes area daily and keeps it dry. Uses neosporin after cleaning area and covers area with 2x2 and uses maxi pad over 2x2 for protection. Denies fever or chills. No concerns just wanted physician to take a look at it before her follow up appointment next Wednesday.   Objective:  BP 124/70   Pulse 80   Temp 98.4 F (36.9 C) (Oral)   Ht 5\' 1"  (1.549 m)   Wt 189 lb (85.7 kg)   LMP 05/21/2017 (Within Weeks)   SpO2 98%   BMI 35.71 kg/m  Vitals and nursing note reviewed  General: well nourished, in no acute distress Neck: supple, non-tender, without lymphadenopathy Cardiac: RRR, clear S1 and S2, no murmurs, rubs, or gallops Respiratory: clear to  auscultation bilaterally, no increased work of breathing Abdomen: soft, nontender, nondistended, no masses or organomegaly. Bowel sounds present Extremities: no edema or cyanosis. Warm, well perfused. 2+ radial pulses bilaterally. Minimal calf tenderness. Negative Homan's sign. No calf edema noted.  Skin: warm and dry, no rashes noted. Incision area is clean and dry, no drainage noted. No erythema or edema.  Neuro: alert and oriented, no focal deficits  Assessment & Plan:    Restless leg syndrome Patient complains of increased leg pain and cramping at nighttime that is relieved with movement or walking. States she has been more sedentary since recent surgery. Less likely DVT as bilateral, no calf edema, and negative Homan's.  -will obtain BMP and iron panel, will replete electrolytes/iron as needed if deficient  -ambulatory referral to PT -encouraged daily exercise  -can consider ropinerole if symptoms not improved and keeping patient from sleeping     Return if symptoms worsen or fail to improve.   Caroline More, DO, PGY-1

## 2017-09-05 ENCOUNTER — Ambulatory Visit (INDEPENDENT_AMBULATORY_CARE_PROVIDER_SITE_OTHER): Payer: 59 | Admitting: Family Medicine

## 2017-09-05 ENCOUNTER — Encounter: Payer: Self-pay | Admitting: Family Medicine

## 2017-09-05 ENCOUNTER — Other Ambulatory Visit: Payer: Self-pay

## 2017-09-05 VITALS — BP 124/70 | HR 80 | Temp 98.4°F | Ht 61.0 in | Wt 189.0 lb

## 2017-09-05 DIAGNOSIS — M79604 Pain in right leg: Secondary | ICD-10-CM | POA: Diagnosis not present

## 2017-09-05 DIAGNOSIS — G2581 Restless legs syndrome: Secondary | ICD-10-CM

## 2017-09-05 DIAGNOSIS — J4551 Severe persistent asthma with (acute) exacerbation: Secondary | ICD-10-CM | POA: Diagnosis not present

## 2017-09-05 DIAGNOSIS — M79605 Pain in left leg: Secondary | ICD-10-CM | POA: Diagnosis not present

## 2017-09-05 MED ORDER — ESCITALOPRAM OXALATE 20 MG PO TABS: 20.0000 mg | ORAL_TABLET | Freq: Every day | ORAL | 3 refills | Status: DC

## 2017-09-05 MED ORDER — ALBUTEROL SULFATE (2.5 MG/3ML) 0.083% IN NEBU: 2.5000 mg | INHALATION_SOLUTION | RESPIRATORY_TRACT | 6 refills | Status: DC | PRN

## 2017-09-05 MED ORDER — CETIRIZINE HCL 10 MG PO TABS: 10.0000 mg | ORAL_TABLET | Freq: Every day | ORAL | 11 refills | Status: DC

## 2017-09-05 MED ORDER — LOSARTAN POTASSIUM 50 MG PO TABS: 50.0000 mg | ORAL_TABLET | Freq: Every day | ORAL | 1 refills | Status: DC

## 2017-09-05 MED ORDER — OXYBUTYNIN CHLORIDE ER 10 MG PO TB24: 10.0000 mg | ORAL_TABLET | Freq: Every day | ORAL | 0 refills | Status: DC

## 2017-09-05 MED ORDER — FLUTICASONE PROPIONATE 50 MCG/ACT NA SUSP: NASAL | 6 refills | Status: DC

## 2017-09-05 MED FILL — OXYBUTYNIN CL ER 10 MG TAB: 10 | 30 days supply | Qty: 30 | Fill #0

## 2017-09-05 MED FILL — ALBUTEROL 0.083% INHAL SOLN: (2.5 MG/3ML | 5 days supply | Qty: 75 | Fill #0

## 2017-09-05 MED FILL — LOSARTAN POTASSIUM 50 MG TA: 50 | 30 days supply | Qty: 30 | Fill #0

## 2017-09-05 MED FILL — FLUTICASONE PROP 50 MCG SPR: 50 | 30 days supply | Qty: 16 | Fill #0

## 2017-09-05 MED FILL — ESCITALOPRAM 20 MG TABLET: 20 | 30 days supply | Qty: 30 | Fill #0

## 2017-09-05 NOTE — Telephone Encounter (Signed)
Attempted to contact patient via phone.  No answer.  Left message "we are trying to return your call, have called a couple of times and keep missing you.  Just wanted to see how you are doing.  Please give [place] a call when you have a chance."

## 2017-09-05 NOTE — Patient Instructions (Signed)
Restless Legs Syndrome Restless legs syndrome is a condition that causes uncomfortable feelings or sensations in the legs, especially while sitting or lying down. The sensations usually cause an overwhelming urge to move the legs. The arms can also sometimes be affected. The condition can range from mild to severe. The symptoms often interfere with a person's ability to sleep. What are the causes? The cause of this condition is not known. What increases the risk? This condition is more likely to develop in:  People who are older than age 20.  Pregnant women. In general, restless legs syndrome is more common in women than in men.  People who have a family history of the condition.  People who have certain medical conditions, such as iron deficiency, kidney disease, Parkinson disease, or nerve damage.  People who take certain medicines, such as medicines for high blood pressure, nausea, colds, allergies, depression, and some heart conditions.  What are the signs or symptoms? The main symptom of this condition is uncomfortable sensations in the legs. These sensations may be:  Described as pulling, tingling, prickling, throbbing, crawling, or burning.  Worse while you are sitting or lying down.  Worse during periods of rest or inactivity.  Worse at night, often interfering with your sleep.  Accompanied by a very strong urge to move your legs.  Temporarily relieved by movement of your legs.  The sensations usually affect both sides of the body. The arms can also be affected, but this is rare. People who have this condition often have tiredness during the day because of their lack of sleep at night. How is this diagnosed? This condition may be diagnosed based on your description of the symptoms. You may also have tests, including blood tests, to check for other conditions that may lead to your symptoms. In some cases, you may be asked to spend some time in a sleep lab so your sleeping  can be monitored. How is this treated? Treatment for this condition is focused on managing the symptoms. Treatment may include:  Self-help and lifestyle changes.  Medicines.  Follow these instructions at home:  Take medicines only as directed by your health care provider.  Try these methods to get temporary relief from the uncomfortable sensations: ? Massage your legs. ? Walk or stretch. ? Take a cold or hot bath.  Practice good sleep habits. For example, go to bed and get up at the same time every day.  Exercise regularly.  Practice ways of relaxing, such as yoga or meditation.  Avoid caffeine and alcohol.  Do not use any tobacco products, including cigarettes, chewing tobacco, or electronic cigarettes. If you need help quitting, ask your health care provider.  Keep all follow-up visits as directed by your health care provider. This is important. Contact a health care provider if: Your symptoms do not improve with treatment, or they get worse. This information is not intended to replace advice given to you by your health care provider. Make sure you discuss any questions you have with your health care provider. Document Released: 10/05/2002 Document Revised: 03/22/2016 Document Reviewed: 10/11/2014 Elsevier Interactive Patient Education  [employer].   It was a pleasure seeing you today.   Today we discussed your leg pain.  I have ordered labs for today (BMP, and iron panel) to make sure your K is at a normal level and to see if you are iron deficient as these are two causes of leg pain. I will either call or send a letter  with those results. If those results are abnormal I will send medications to replete those levels. I have also ordered physical therapy as I believe this will help with leg pain.   Please follow up in as needed or sooner if symptoms persist or worsen. Please call the clinic immediately if you have further leg pain that keeps you from sleeping or if  you have concerns.   Our clinic's number is [phone]. Please call with questions or concerns.   Thank you,  Caroline More, DO

## 2017-09-05 NOTE — Assessment & Plan Note (Signed)
Patient complains of increased leg pain and cramping at nighttime that is relieved with movement or walking. States she has been more sedentary since recent surgery. Less likely DVT as bilateral, no calf edema, and negative Homan's.  -will obtain BMP and iron panel, will replete electrolytes/iron as needed if deficient  -ambulatory referral to PT -encouraged daily exercise  -can consider ropinerole if symptoms not improved and keeping patient from sleeping

## 2017-09-06 LAB — BASIC METABOLIC PANEL
BUN/Creatinine Ratio: 13 (ref 9–23)
BUN: 13 mg/dL (ref 6–24)
CALCIUM: 9.6 mg/dL (ref 8.7–10.2)
CO2: 26 mmol/L (ref 20–29)
CREATININE: 0.98 mg/dL (ref 0.57–1.00)
Chloride: 102 mmol/L (ref 96–106)
GFR calc Af Amer: 78 mL/min/{1.73_m2} (ref >59)
GFR calc non Af Amer: 67 mL/min/{1.73_m2} (ref >59)
GLUCOSE: 100 mg/dL — AB (ref 65–99)
Potassium: 4.3 mmol/L (ref 3.5–5.2)
Sodium: 141 mmol/L (ref 134–144)

## 2017-09-06 LAB — ANEMIA PANEL
FOLATE, RBC: 838 ng/mL (ref >498)
Ferritin: 160 ng/mL — ABNORMAL HIGH (ref 15–150)
Folate, Hemolysate: 284.1 ng/mL
Hematocrit: 33.9 % — ABNORMAL LOW (ref 34.0–46.6)
IRON SATURATION: 27 % (ref 15–55)
IRON: 72 ug/dL (ref 27–159)
RETIC CT PCT: 1.3 % (ref 0.6–2.6)
TIBC: 263 ug/dL (ref 250–450)
UIBC: 191 ug/dL (ref 131–425)
Vitamin B-12: 386 pg/mL (ref 232–1245)

## 2017-09-09 ENCOUNTER — Encounter: Payer: Self-pay | Admitting: Physical Therapy

## 2017-09-09 ENCOUNTER — Other Ambulatory Visit: Payer: Self-pay

## 2017-09-09 VITALS — BP 122/80 | HR 60 | Resp 16 | Ht 61.5 in | Wt 191.0 lb

## 2017-09-09 DIAGNOSIS — R7303 Prediabetes: Secondary | ICD-10-CM

## 2017-09-09 MED FILL — ESOMEPRAZOLE MAG DR 40 MG C: 40 | 30 days supply | Qty: 30 | Fill #2

## 2017-09-09 NOTE — Patient Outreach (Signed)
[facility]) Care Management   09/09/2017  [name] [name] 26-Jun-1966 [id number]  [name] is an 51 y.o. female.  Member seen for office visit for Link to Wellness program for self management of Pre-diabetes  Subjective: Member states that she has been off work after having a hysterectomy.  States she does not want her pre-diabetes to get worse.  States she tries to eat healthly and does not eat sweets.  States she was going to the [employer] gym to exercise before her surgery and she wants to start back when she goes back to work,  Objective:   ROS  Physical Exam Today's Vitals   09/09/17 1103  BP: 122/80  Pulse: 60  Resp: 16  SpO2: 100%  Weight: 191 lb (86.6 kg)  Height: 1.562 m (5' 1.5")  PainSc: 2   PainLoc: Leg   Encounter Medications:   Outpatient Encounter Medications as of 09/09/2017  Medication Sig Note  . albuterol (PROVENTIL) (2.5 MG/3ML) 0.083% nebulizer solution Take 3 mLs (2.5 mg total) every 4 (four) hours as needed by nebulization.   . baclofen (LIORESAL) 10 MG tablet Take 1 tablet (10 mg total) by mouth 3 (three) times daily as needed (headaches).   Marland Kitchen BIOTIN PO Take 2,000 mg by mouth daily.   . cetirizine (ZYRTEC) 10 MG tablet Take 1 tablet (10 mg total) daily by mouth.   . cyclobenzaprine (FLEXERIL) 10 MG tablet Take 0.5-1 tablets (5-10 mg total) by mouth every 8 (eight) hours as needed for muscle spasms.   . diclofenac (VOLTAREN) 75 MG EC tablet Take 1 tablet (75 mg total) by mouth 2 (two) times daily with a meal.   . Diclofenac Potassium (ZIPSOR) 25 MG CAPS Take 25 mg by mouth daily.   Marland Kitchen eletriptan (RELPAX) 40 MG tablet Take 1 tablet (40 mg total) by mouth as needed for migraine or headache. May repeat in 2 hours if headache persists or recurs.   Marland Kitchen escitalopram (LEXAPRO) 20 MG tablet Take 1 tablet (20 mg total) daily by mouth.   . esomeprazole (NEXIUM) 40 MG capsule TAKE 1 CAPSULE BY MOUTH ONCE DAILY   . estrogen-methylTESTOSTERone 0.625-1.25 MG  tablet Take 1 tablet by mouth daily.   . fluticasone (FLONASE) 50 MCG/ACT nasal spray PLACE 2 SPRAYS INTO THE NOSE DAILY.   . furosemide (LASIX) 20 MG tablet TAKE 1 TABLET BY MOUTH DAILY.   Marland Kitchen gabapentin (NEURONTIN) 300 MG capsule Take 1 capsule (300 mg total) by mouth every 8 (eight) hours as needed (PAIN). Can take 600 mg at night prn hot flashes   . losartan (COZAAR) 50 MG tablet Take 1 tablet (50 mg total) daily by mouth.   . megestrol (MEGACE) 40 MG tablet Take 40 mg by mouth daily.    . mometasone-formoterol (DULERA) 200-5 MCG/ACT AERO Inhale 2 puffs into the lungs 2 (two) times daily.   . montelukast (SINGULAIR) 10 MG tablet Take 1 tablet (10 mg total) by mouth at bedtime.   . Multiple Vitamin (MULTIVITAMIN WITH MINERALS) TABS tablet Take 1 tablet by mouth daily.   . ondansetron (ZOFRAN) 4 MG tablet Take 1 tablet (4 mg total) by mouth every 8 (eight) hours as needed for nausea or vomiting.   Marland Kitchen oxybutynin (DITROPAN XL) 10 MG 24 hr tablet Take 1 tablet (10 mg total) at bedtime by mouth.   . propranolol ER (INDERAL LA) 80 MG 24 hr capsule Take 1 capsule (80 mg total) by mouth daily.   . Pyridoxine HCl (VITAMIN  B-6 PO) Take 1 tablet by mouth daily. Unknown OTC strength   . Spacer/Aero-Holding Chambers (AEROCHAMBER MV) inhaler Use as instructed   . SUMAtriptan (IMITREX) 50 MG tablet Take 50 mg by mouth every 2 (two) hours as needed for migraine. May repeat in 2 hours if headache persists or recurs.   . Tiotropium Bromide Monohydrate (SPIRIVA RESPIMAT) 2.5 MCG/ACT AERS Inhale 2 puffs into the lungs daily.   . traMADol (ULTRAM) 50 MG tablet Take 2 tablets (100 mg total) by mouth every 6 (six) hours as needed. (Patient taking differently: Take 100 mg by mouth every 6 (six) hours as needed for moderate pain. )   . vitamin C (ASCORBIC ACID) 500 MG tablet Take 500 mg by mouth daily.    Eduard Roux (AIMOVIG) 70 MG/ML SOAJ Inject 70 mg into the skin. 08/21/2017: States will call MD to verify  prescription sent to pharmacy, has not received to date.  . simvastatin (ZOCOR) 40 MG tablet TAKE 1 TABLET BY MOUTH AT BEDTIME. (Patient not taking: Reported on 09/09/2017)    No facility-administered encounter medications on file as of 09/09/2017.     Functional Status:   In your present state of health, do you have any difficulty performing the following activities: 09/09/2017 08/30/2017  Hearing? N N  Vision? N N  Difficulty concentrating or making decisions? N N  Walking or climbing stairs? N N  Dressing or bathing? N N  Doing errands, shopping? N -  Some recent data might be hidden    Fall/Depression Screening:    Fall Risk  09/09/2017 09/05/2017 04/04/2017  Falls in the past year? Yes Yes No  Number falls in past yr: 1 1 -  Injury with Fall? No No -  Risk for fall due to : - - -   PHQ 2/9 Scores 09/09/2017 09/05/2017 06/10/2017 05/29/2017 05/08/2017 04/10/2017 02/15/2017  PHQ - 2 Score 0 0 0 1 2 2 2   PHQ- 9 Score - - - - 6 - 7  Exception Documentation - - - - - - -    Assessment:  Member seen for office visit for Link to Wellness program for self management of Pre -diabetes.  Last hemoglobin A1C was 5.7%.  Member is recovering from hysterectomy and will return to work soon.  Needs instruction on disease processes and how to prevent diabetes.  Plan:  Instructed on pre-diabetes and the importance of exercise and weight loss Given Link to Wellness packet on diabetes and reviewed booklet Living Well with Diabetes Referral made [facility] for diet education Instructed that she can be followed by [facility] in 2019.   Member assessed with no further intervention needed. Peter Garter RN, [facility] Care Management Coordinator-Link to [place] [facility] [phone]

## 2017-09-10 ENCOUNTER — Telehealth: Payer: Self-pay | Admitting: *Deleted

## 2017-09-10 ENCOUNTER — Ambulatory Visit (INDEPENDENT_AMBULATORY_CARE_PROVIDER_SITE_OTHER): Payer: 59 | Admitting: Neurology

## 2017-09-10 ENCOUNTER — Encounter: Payer: Self-pay | Admitting: Neurology

## 2017-09-10 ENCOUNTER — Encounter: Payer: Self-pay | Admitting: Family Medicine

## 2017-09-10 VITALS — BP 154/87 | HR 57 | Ht 61.5 in | Wt 191.2 lb

## 2017-09-10 DIAGNOSIS — G43711 Chronic migraine without aura, intractable, with status migrainosus: Secondary | ICD-10-CM | POA: Diagnosis not present

## 2017-09-10 MED ORDER — NONFORMULARY OR COMPOUNDED ITEM: 1.0000 "application " | 11 refills | Status: DC

## 2017-09-10 NOTE — Progress Notes (Signed)
Ajovy subcutaneous injection given in pt's RUOQ of abdomen. Pt tolerated well, no bleeding. Pt educated, aware of schedule- 1 injection every 30 days, when to take next dose (December 13th) and alternate sites to use. She verbalized understanding.

## 2017-09-10 NOTE — Telephone Encounter (Addendum)
Received FMLA forms from patient. Sent to medical records for processing/payment collection prior to be filled out.

## 2017-09-10 NOTE — Telephone Encounter (Signed)
Ajovy prescription faxed to [facility] pharmacy # [phone]. Received a receipt of confirmation.

## 2017-09-10 NOTE — Progress Notes (Signed)
[facility]    Provider:  Dr Jaynee Eagles Referring Provider: Caroline More, DO Primary Care Physician:  Caroline More, DO  CC: Migraines  Interval History 09/10/2017: At last appointment ordered MRIs of the brain and orbits, they were not completed. Also ordered physical therapy but finished only 2/13 visits. Had surgery 08/15/2017. hysterectomy, Bilateral salpingectomy, left oopherectomy. She can't afford MRI or PT. Started Aimovig, but she could not get it. Will try Ajovy, will give a copay ard.    HPI:  [name] is a 51 y.o. female here as a referral from Dr. Tammi Klippel for migraines. Past medical history of migraines, high blood pressure, anxiety, depression, asthma.  Headaches wake her up in the morning. Migraines since the age of 43. Worse this year, she was taking care of her grandmother and passed away and there has been stress and a bad relationship. She has had migraine for days after getting a flu shot. She was a patient of the headache wellness center. They start at the back of there head and spread all ove rthe head, she has tenderness of the skin, She has a prodrome feels the pain in the neck. For the last year. She has light and sound sensitivity, it is pulsating and throbbing and pounding with nausea and vomiting and also has vision changes and episodic vision loss unknown eye. Migraines can last weeks, has had one for weeks now, she was in the ED last Saturday, she is missing work. Sleeping in a dark room helps. Stress worsens it. Woke up with a headache again this morning and she has neck pain and neck tightness. Reports daily headaches and at least 15 are migrainous for the last 9 months. She has allergies as well. No medication overuse. No aura. No other focal neurologic deficits, associated symptoms, inciting events or modifiable factors. Lots of stress.   Medications tried include: Baclofen, Imitrex, Lexapro, gabapentin, losartan, naproxen, Zofran,  propranolol, tramadol. She tried amitriptyline and has an allergy to it, zomig  Reviewed notes, labs and imaging from outside physicians, which showed:  presented the emergency room for evaluation of intermittent, generalized headache for the past 4 days on 07/20/2017. Patient had associated dizziness and feeling off balance with nausea. The headache began in the back of the head and radiated to the front. Similar to previous migraines. She tried ibuprofen. The past she received several Botox injections with provided her with 1-2 years relief in her migraines. Denied any numbness, fevers, chills, headache injury, falls, blood thinner use. She was given Compazine, Benadryl and IV fluids in the emergency room. Symptoms reviewed and she was discharged.   Review of Systems: Patient complains of symptoms per HPI as well as the following symptoms: Shortness of breath, cough, wheezing, snoring, aching muscles, allergies, headache, dizziness, depression, anxiety, changes in appetite. Pertinent negatives and positives per HPI. All others negative.   Social History   Socioeconomic History  . Marital status: Single    Spouse name: Not on file  . Number of children: Not on file  . Years of education: Not on file  . Highest education level: Not on file  Social Needs  . Financial resource strain: Not on file  . Food insecurity - worry: Not on file  . Food insecurity - inability: Not on file  . Transportation needs - medical: Not on file  . Transportation needs - non-medical: Not on file  Occupational History  . Not on file  Tobacco Use  . Smoking status: Former Smoker  Packs/day: 0.25    Years: 15.00    Pack years: 3.75    Types: Cigarettes    Last attempt to quit: 02/16/2003    Years since quitting: 14.5  . Smokeless tobacco: Never Used  Substance and Sexual Activity  . Alcohol use: Yes    Comment: socially  . Drug use: No  . Sexual activity: Not Currently    Partners: Male    Birth  control/protection: Surgical  Other Topics Concern  . Not on file  Social History Narrative   ** Merged History Encounter **        Family History  Problem Relation Age of Onset  . Diabetes Maternal Grandmother   . Anesthesia problems Neg Hx   . Headache Neg Hx   . Aneurysm Neg Hx     Past Medical History:  Diagnosis Date  . Anemia   . Anxiety   . Asthma   . COPD (chronic obstructive pulmonary disease) ([place])   . Depression   . Dysrhythmia    Irregular heat w/ asthma episodes and anxiety  . GERD (gastroesophageal reflux disease)   . Hypertension   . Migraines   . PONV (postoperative nausea and vomiting)   . Pre-diabetes   . Sleep apnea    uses CPAP  . Urinary incontinence    frequent urination- tx w/ vesicare  . Urinary urgency     Past Surgical History:  Procedure Laterality Date  . ABLATION    . CESAREAN SECTION     x3  . DILATION AND CURETTAGE OF UTERUS  05/31/2005   MAB, TAB x 2  . HERNIA REPAIR  1990's  . HYSTEROSCOPY W/ ENDOMETRIAL ABLATION    . LEEP    . MAB     x 4  . MOUTH SURGERY     teeth extraction  . TUBAL LIGATION  11/01/2006  . WISDOM TOOTH EXTRACTION      Current Outpatient Medications  Medication Sig Dispense Refill  . albuterol (PROVENTIL) (2.5 MG/3ML) 0.083% nebulizer solution Take 3 mLs (2.5 mg total) every 4 (four) hours as needed by nebulization. 75 mL 6  . baclofen (LIORESAL) 10 MG tablet Take 1 tablet (10 mg total) by mouth 3 (three) times daily as needed (headaches). 60 each 1  . BIOTIN PO Take 2,000 mg by mouth daily.    . cetirizine (ZYRTEC) 10 MG tablet Take 1 tablet (10 mg total) daily by mouth. 30 tablet 11  . cyclobenzaprine (FLEXERIL) 10 MG tablet Take 0.5-1 tablets (5-10 mg total) by mouth every 8 (eight) hours as needed for muscle spasms. 30 tablet 0  . diclofenac (VOLTAREN) 75 MG EC tablet Take 1 tablet (75 mg total) by mouth 2 (two) times daily with a meal. 60 tablet 0  . Diclofenac Potassium (ZIPSOR) 25 MG CAPS Take 25 mg  by mouth daily.    Marland Kitchen eletriptan (RELPAX) 40 MG tablet Take 1 tablet (40 mg total) by mouth as needed for migraine or headache. May repeat in 2 hours if headache persists or recurs. 10 tablet 11  . Erenumab-aooe (AIMOVIG) 70 MG/ML SOAJ Inject 70 mg into the skin.    Marland Kitchen escitalopram (LEXAPRO) 20 MG tablet Take 1 tablet (20 mg total) daily by mouth. 30 tablet 3  . esomeprazole (NEXIUM) 40 MG capsule TAKE 1 CAPSULE BY MOUTH ONCE DAILY 30 capsule 6  . estrogen-methylTESTOSTERone 0.625-1.25 MG tablet Take 1 tablet by mouth daily. 30 tablet 2  . fluticasone (FLONASE) 50 MCG/ACT nasal spray PLACE 2  SPRAYS INTO THE NOSE DAILY. 16 g 6  . furosemide (LASIX) 20 MG tablet TAKE 1 TABLET BY MOUTH DAILY. 30 tablet 3  . gabapentin (NEURONTIN) 300 MG capsule Take 1 capsule (300 mg total) by mouth every 8 (eight) hours as needed (PAIN). Can take 600 mg at night prn hot flashes 90 capsule 3  . losartan (COZAAR) 50 MG tablet Take 1 tablet (50 mg total) daily by mouth. 30 tablet 1  . megestrol (MEGACE) 40 MG tablet Take 40 mg by mouth daily.     . mometasone-formoterol (DULERA) 200-5 MCG/ACT AERO Inhale 2 puffs into the lungs 2 (two) times daily. 8.8 g 5  . montelukast (SINGULAIR) 10 MG tablet Take 1 tablet (10 mg total) by mouth at bedtime. 90 tablet 3  . Multiple Vitamin (MULTIVITAMIN WITH MINERALS) TABS tablet Take 1 tablet by mouth daily.    . ondansetron (ZOFRAN) 4 MG tablet Take 1 tablet (4 mg total) by mouth every 8 (eight) hours as needed for nausea or vomiting. 6 tablet 0  . oxybutynin (DITROPAN XL) 10 MG 24 hr tablet Take 1 tablet (10 mg total) at bedtime by mouth. 30 tablet 0  . propranolol ER (INDERAL LA) 80 MG 24 hr capsule Take 1 capsule (80 mg total) by mouth daily. 30 capsule 2  . Pyridoxine HCl (VITAMIN B-6 PO) Take 1 tablet by mouth daily. Unknown OTC strength    . simvastatin (ZOCOR) 40 MG tablet TAKE 1 TABLET BY MOUTH AT BEDTIME. 90 tablet 3  . Spacer/Aero-Holding Chambers (AEROCHAMBER MV) inhaler  Use as instructed 1 each 0  . SUMAtriptan (IMITREX) 50 MG tablet Take 50 mg by mouth every 2 (two) hours as needed for migraine. May repeat in 2 hours if headache persists or recurs.    . Tiotropium Bromide Monohydrate (SPIRIVA RESPIMAT) 2.5 MCG/ACT AERS Inhale 2 puffs into the lungs daily. 4 g 3  . traMADol (ULTRAM) 50 MG tablet Take 2 tablets (100 mg total) by mouth every 6 (six) hours as needed. (Patient taking differently: Take 100 mg by mouth every 6 (six) hours as needed for moderate pain. ) 30 tablet 0  . vitamin C (ASCORBIC ACID) 500 MG tablet Take 500 mg by mouth daily.      No current facility-administered medications for this visit.     Allergies as of 09/10/2017 - Review Complete 09/10/2017  Allergen Reaction Noted  . Amitriptyline  06/07/2013  . Fluticasone-salmeterol Other (See Comments) 05/11/2010  . Penicillins Itching and Swelling 07/17/2013  . Ciprofloxacin Itching and Rash 09/01/2012  . Macrobid [nitrofurantoin monohyd macro] Itching 09/01/2012  . Sulfamethoxazole-trimethoprim Rash     Vitals: BP (!) 154/87   Pulse (!) 57   Ht 5' 1.5" (1.562 m)   Wt 191 lb 3.2 oz (86.7 kg)   LMP 05/21/2017 (Within Weeks)   BMI 35.54 kg/m  Last Weight:  Wt Readings from Last 1 Encounters:  09/10/17 191 lb 3.2 oz (86.7 kg)   Last Height:   Ht Readings from Last 1 Encounters:  09/10/17 5' 1.5" (1.562 m)    Physical exam: Exam: Gen: NAD, conversant, well nourised, obese, well groomed                     CV: RRR, no MRG. No Carotid Bruits. No peripheral edema, warm, nontender Eyes: Conjunctivae clear without exudates or hemorrhage  Neuro: Detailed Neurologic Exam  Speech:    Speech is normal; fluent and spontaneous with normal comprehension.  Cognition:  The patient is oriented to person, place, and time;     recent and remote memory intact;     language fluent;     normal attention, concentration,     fund of knowledge Cranial Nerves:    The pupils are equal,  round, and reactive to light. The fundi are normal and spontaneous venous pulsations are present. Visual fields are full to finger confrontation. Extraocular movements are intact. Trigeminal sensation is intact and the muscles of mastication are normal. The face is symmetric. The palate elevates in the midline. Hearing intact. Voice is normal. Shoulder shrug is normal. The tongue has normal motion without fasciculations.   Coordination:    Normal finger to nose and heel to shin. Normal rapid alternating movements.   Gait:    Heel-toe and tandem gait are normal.   Motor Observation:    No asymmetry, no atrophy, and no involuntary movements noted. Tone:    Normal muscle tone.    Posture:    Posture is normal. normal erect    Strength:    Strength is V/V in the upper and lower limbs.      Sensation: intact to LT     Reflex Exam:  DTR's:    Deep tendon reflexes in the upper and lower extremities are normal bilaterally.   Toes:    The toes are downgoing bilaterally.   Clonus:    Clonus is absent.    Assessment/Plan:  51 year old with chronic migraines with status migrainosus, positional headaches, vision loss, pain on eye movement, morning headaches even though she is treated effectively with CPAP, morbid obesity, myofascial cervical neck pain. She has failed multiple migraine medication classes.    Imaging: Given the positional nature of the headaches.need MRI of the brain to evaluate for lesions, tumors, intracranial HTN, Also MRI orbits due to pain on eye movement to eval for orbital psudotumor. She declines.  Physical Therapy: Cervical myofascial pain, forward posture contributing to migraines and cervicalgia. Please evaluate and treat including dry needling, stretching, strengthening, manual therapy/massage, heating, TENS unit, exercising for scapular stabilization, pectoral stretching and rhomboid strengthening as clinically warranted as well as any other modality as  recommended by evaluation.  Start Ajovy, could not get aimovig approved, discussed botox will try CGRP Ab initially  At onset of headache take Relpax (eletriptan) with his Zipsor may repeat once in 2 hours. No more than 2 times in one day or 2 days in one week.  Sarina Ill, MD  [facility] [address] [place], [place] [address]  Phone [phone] Fax [phone]  A total of 15 minutes was spent face-to-face with this patient. Over half this time was spent on counseling patient on the chronic migraines diagnosis and different diagnostic and therapeutic options available.

## 2017-09-11 ENCOUNTER — Encounter: Payer: Self-pay | Admitting: Obstetrics & Gynecology

## 2017-09-11 ENCOUNTER — Telehealth: Payer: Self-pay | Admitting: *Deleted

## 2017-09-11 ENCOUNTER — Ambulatory Visit (INDEPENDENT_AMBULATORY_CARE_PROVIDER_SITE_OTHER): Payer: 59 | Admitting: Obstetrics & Gynecology

## 2017-09-11 ENCOUNTER — Telehealth: Payer: Self-pay | Admitting: Neurology

## 2017-09-11 ENCOUNTER — Encounter: Payer: Self-pay | Admitting: Physical Therapy

## 2017-09-11 VITALS — BP 149/89 | HR 56 | Ht 61.0 in | Wt 189.0 lb

## 2017-09-11 DIAGNOSIS — R232 Flushing: Secondary | ICD-10-CM

## 2017-09-11 DIAGNOSIS — Z90711 Acquired absence of uterus with remaining cervical stump: Secondary | ICD-10-CM

## 2017-09-11 DIAGNOSIS — Z09 Encounter for follow-up examination after completed treatment for conditions other than malignant neoplasm: Secondary | ICD-10-CM

## 2017-09-11 MED ORDER — EST ESTROGENS-METHYLTEST 0.625-1.25 MG PO TABS: 1.0000 | ORAL_TABLET | Freq: Every day | ORAL | 5 refills | Status: DC

## 2017-09-11 NOTE — Telephone Encounter (Signed)
Pt matrix form on [employer].

## 2017-09-11 NOTE — Telephone Encounter (Signed)
Yes Ajovy, I placed it in the "notes to pharmacy" please call them back and clarify thanks. Also give them a copay # thanks!

## 2017-09-11 NOTE — Patient Instructions (Signed)
Return to clinic for any scheduled appointments or for any gynecologic concerns as needed.   

## 2017-09-11 NOTE — Telephone Encounter (Signed)
[facility] and clarified order for Ajovy 225 mg/ 1.5 mL. Also gave them the savings card numbers (BIN, GRP, ID, PCN). They verbalized understanding.

## 2017-09-11 NOTE — Telephone Encounter (Signed)
Received call from Audrea Muscat @ [facility] regarding prescription sent yesterday. They need clarification on the drug name. I informed her I would have Dr. Jaynee Eagles clarify and we would send new prescription.

## 2017-09-11 NOTE — Progress Notes (Signed)
Subjective:     [name] is a 51 y.o. female who presents to the clinic 3.5 weeks status post supracervical hysterectomy for abnormal uterine bleeding. Eating a regular diet without difficulty. Bowel movements are normal. Pain is controlled with current analgesics. Medications being used: ibuprofen (OTC).  Was seen in [facility] on 08/30/17 for hot flashes, her HRT was changed from Prempro to Estrogen-Methyltesterone which she likes a lot more and desires refills.  She also had a little bleeding since yesterday, wants this to be evaluated. No other concerns.  The following portions of the patient's history were reviewed and updated as appropriate: allergies, current medications, past family history, past medical history, past social history, past surgical history and problem list.  Review of Systems Pertinent items noted in HPI and remainder of comprehensive ROS otherwise negative.    Objective:    BP (!) 149/89   Pulse (!) 56   Ht 5\' 1"  (1.549 m)   Wt 189 lb (85.7 kg)   LMP 05/21/2017 (Within Weeks)   BMI 35.71 kg/m  General:  alert and no distress  Abdomen: soft, bowel sounds active, non-tender  Incision:   healing well, no drainage, no erythema, no hernia, no seroma, no swelling, no dehiscence, incision well approximated  Pelvis: Small amount of red-brown blood in vault. Small clot at cervical os, no active bleeding.    08/19/2017 Surgical Diagnosis Uterus and bilateral fallopian tubes, left ovary ENDOMETRIUM: - INACTIVE. - NO EVIDENCE OF HYPERPLASIA OR MALIGNANCY. MYOMETRIUM: - ADENOMYOSIS. - LEIOMYOMATA. - NO EVIDENCE OF MALIGNANCY. LEFT OVARY: - UNREMARKABLE. - NO ENDOMETRIOSIS OR MALIGNANCY. BILATERAL FALLOPIAN TUBES: - BENIGN PARATUBAL CYST. - NO ENDOMETRIOSIS OR MALIGNANCY.  Assessment:    Doing well postoperatively. Operative findings again reviewed. Pathology report discussed.    Plan:   1. Continue any current medications. 2. Wound care discussed. Reassured  about bleeding from cervix in the subacute postoperative period.  Will continue to monitor. Bleeding precautions reviewed.  3. Activity restrictions: routine postoperative restrictions as per discharge instructions 4. Anticipated return to work: 4 weeks and work Quarry manager provided. 5. Follow up: 4 week for repeat evaluation.      Verita Schneiders, MD, [facility] [place], [facility] for [employer], [place]

## 2017-09-18 NOTE — Telephone Encounter (Signed)
PA for Ajovy completed on CoverMyMeds (Key: [id number]). Response expected within 5 days.

## 2017-09-26 MED FILL — AJOVY 225 MG/1.5ML SOSY: 225 | 30 days supply | Qty: 2 | Fill #0

## 2017-10-01 MED FILL — ESTROGEN-METHYLTESTOS H.S.: 0.625-1.25 | 30 days supply | Qty: 30 | Fill #1

## 2017-10-04 ENCOUNTER — Encounter: Payer: Self-pay | Admitting: Internal Medicine

## 2017-10-04 ENCOUNTER — Ambulatory Visit (INDEPENDENT_AMBULATORY_CARE_PROVIDER_SITE_OTHER): Payer: 59 | Admitting: Internal Medicine

## 2017-10-04 VITALS — BP 144/86 | HR 72 | Resp 16 | Ht 61.0 in | Wt 190.0 lb

## 2017-10-04 DIAGNOSIS — J45909 Unspecified asthma, uncomplicated: Secondary | ICD-10-CM | POA: Diagnosis not present

## 2017-10-04 DIAGNOSIS — G4733 Obstructive sleep apnea (adult) (pediatric): Secondary | ICD-10-CM | POA: Diagnosis not present

## 2017-10-04 MED ORDER — MOMETASONE FURO-FORMOTEROL FUM 200-5 MCG/ACT IN AERO: 2.0000 | INHALATION_SPRAY | Freq: Two times a day (BID) | RESPIRATORY_TRACT | 5 refills | Status: DC

## 2017-10-04 NOTE — Progress Notes (Signed)
* [facility]     Assessment and Plan:  Obstructive sleep apnea. -She had stopped wearing CPAP completely for the last few months.  She is starting to feel more sleepy again. - Discussed the importance of staying on CPAP, is going to try to get back on it and use it every night.   Circadian rhythm/shift work sleep disorder. -Patient works an overnight shift, this may be contributing to her daytime sleepiness. -She may need medication to help her stay awake but her OSA has to be maximally treated before doing this.   Excessive daytime sleepiness. -Multifactorial from sedating medication, OSA, shift work disorder.   Asthma --Continue spiriva.  Dulera restarted --Continue nexium for gerd.   Date: 10/04/2017  MRN# [id number] COREN CROWNOVER 08/30/1966   [name] is a 51 y.o. old female seen in follow up for chief complaint of  Chief Complaint  Patient presents with  . Sleep Apnea    pt here for f/u cpap therapy: Pt states cpap therapy is going well. She has not worn cpap nightly.     HPI:   The patient is a 51 year old female. She underwent a sleep study on 12/02/15, which showed an AHI of 6, consistent with mild obstructive sleep apnea. Her settings are 5-20; residual AHI of 0.7. 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 She goes to work by 3 AM. Since her last visit she has not been using her CPAP. She had partial hysterectomy in October and used her CPAP for one day after that. She has been on an extended trip, and has not taken her CPAP with her.  She also has a history of asthma, she feels that this is worse over the last few weeks.  This is variable depending on the day.  Her dulera ran out, she has remained on spiriva and albuterol.  She takes albuterol 2 puffs in the morning along with her spiriva. She was on dulera but it ran out.  She notes that her breathing is worse on some days, she has not been active since the surgery.   She has a  history of not getting inadequate sleep, on average for 4 hours 20 min. She is sleepy by 7, goes to bed around 8 pm and puts on her nasal mask, then falls asleep within about 15 min. She does not wake at night. She wakes at 2 am to her alarm to be at work by [employer], she works at [employer] at [employer] [facility] [employer] [facility] [place].   **Download data 9/24-10/23/17:23/30 days; greater than 4 hours equals 13 days. 43%. Average usage is 4 hours 20 minutes. Onset 5-20; 95th percentile pressure is 10, residual AHI 0.7.  She takes nexium once in am. She used to get severe "strangling" if she eats close to bed time which improved when she stopped eating close to bedtime.   They have a dog but not in her bedroom.    Medication:   Outpatient Encounter Medications as of 10/04/2017  Medication Sig  . albuterol (PROVENTIL) (2.5 MG/3ML) 0.083% nebulizer solution Take 3 mLs (2.5 mg total) every 4 (four) hours as needed by nebulization.  . baclofen (LIORESAL) 10 MG tablet Take 1 tablet (10 mg total) by mouth 3 (three) times daily as needed (headaches).  Marland Kitchen BIOTIN PO Take 2,000 mg by mouth daily.  . cetirizine (ZYRTEC) 10 MG tablet Take 1 tablet (10 mg total) daily by mouth.  . cyclobenzaprine (FLEXERIL) 10 MG tablet Take 0.5-1 tablets (5-10  mg total) by mouth every 8 (eight) hours as needed for muscle spasms.  . diclofenac (VOLTAREN) 75 MG EC tablet Take 1 tablet (75 mg total) by mouth 2 (two) times daily with a meal.  . eletriptan (RELPAX) 40 MG tablet Take 1 tablet (40 mg total) by mouth as needed for migraine or headache. May repeat in 2 hours if headache persists or recurs.  Marland Kitchen escitalopram (LEXAPRO) 20 MG tablet Take 1 tablet (20 mg total) daily by mouth.  . esomeprazole (NEXIUM) 40 MG capsule TAKE 1 CAPSULE BY MOUTH ONCE DAILY  . estrogen-methylTESTOSTERone 0.625-1.25 MG tablet Take 1 tablet daily by mouth.  . fluticasone (FLONASE) 50 MCG/ACT nasal spray PLACE 2 SPRAYS INTO THE NOSE DAILY.  . furosemide (LASIX) 20 MG tablet TAKE 1 TABLET BY MOUTH  DAILY.  Marland Kitchen gabapentin (NEURONTIN) 300 MG capsule Take 1 capsule (300 mg total) by mouth every 8 (eight) hours as needed (PAIN). Can take 600 mg at night prn hot flashes  . losartan (COZAAR) 50 MG tablet Take 1 tablet (50 mg total) daily by mouth.  . mometasone-formoterol (DULERA) 200-5 MCG/ACT AERO Inhale 2 puffs into the lungs 2 (two) times daily.  . montelukast (SINGULAIR) 10 MG tablet Take 1 tablet (10 mg total) by mouth at bedtime.  . Multiple Vitamin (MULTIVITAMIN WITH MINERALS) TABS tablet Take 1 tablet by mouth daily.  . NONFORMULARY OR COMPOUNDED ITEM Inject 1 application every 30 (thirty) days into the skin.  Marland Kitchen ondansetron (ZOFRAN) 4 MG tablet Take 1 tablet (4 mg total) by mouth every 8 (eight) hours as needed for nausea or vomiting.  Marland Kitchen oxybutynin (DITROPAN XL) 10 MG 24 hr tablet Take 1 tablet (10 mg total) at bedtime by mouth.  . propranolol ER (INDERAL LA) 80 MG 24 hr capsule Take 1 capsule (80 mg total) by mouth daily.  . Pyridoxine HCl (VITAMIN B-6 PO) Take 1 tablet by mouth daily. Unknown OTC strength  . simvastatin (ZOCOR) 40 MG tablet TAKE 1 TABLET BY MOUTH AT BEDTIME.  Marland Kitchen Spacer/Aero-Holding Chambers (AEROCHAMBER MV) inhaler Use as instructed  . SUMAtriptan (IMITREX) 50 MG tablet Take 50 mg by mouth every 2 (two) hours as needed for migraine. May repeat in 2 hours if headache persists or recurs.  . Tiotropium Bromide Monohydrate (SPIRIVA RESPIMAT) 2.5 MCG/ACT AERS Inhale 2 puffs into the lungs daily.  . traMADol (ULTRAM) 50 MG tablet Take 2 tablets (100 mg total) by mouth every 6 (six) hours as needed. (Patient taking differently: Take 100 mg by mouth every 6 (six) hours as needed for moderate pain. )  . vitamin C (ASCORBIC ACID) 500 MG tablet Take 500 mg by mouth daily.    No facility-administered encounter medications on file as of 10/04/2017.      Allergies:  Amitriptyline; Fluticasone-salmeterol; Penicillins; Ciprofloxacin; Macrobid [nitrofurantoin monohyd macro]; and  Sulfamethoxazole-trimethoprim  Review of Systems: Gen:  Denies  fever, sweats. HEENT: Denies blurred vision. Cvc:  No dizziness, chest pain or heaviness Resp:   Denies cough or sputum porduction. Endoc:  No polyuria, polydipsia. Psych: No depression, insomnia. Other:  All other systems were reviewed and found to be negative other than what is mentioned in the HPI.   Physical Examination:   VS: BP (!) 144/86 (BP Location: Left Arm, Cuff Size: Large)   Pulse 72   Resp 16   Ht 5\' 1"  (1.549 m)   Wt 190 lb (86.2 kg)   LMP 05/21/2017 (Within Weeks)   SpO2 99%   BMI 35.90 kg/m  General Appearance: No distress  Neuro:without focal findings,  speech normal,  HEENT: PERRLA, EOM intact. Pulmonary: normal breath sounds, No wheezing.   CardiovascularNormal S1,S2.  No m/r/g.   Abdomen: Benign, Soft, non-tender. Renal:  No costovertebral tenderness  GU:  Not performed at this time. Endoc: No evident thyromegaly, no signs of acromegaly. Skin:   warm, no rash. Extremities: normal, no cyanosis, clubbing.   LABORATORY PANEL:   CBC No results for input(s): WBC, HGB, HCT, PLT in the last 168 hours. ------------------------------------------------------------------------------------------------------------------  Chemistries  No results for input(s): NA, K, CL, CO2, GLUCOSE, BUN, CREATININE, CALCIUM, MG, AST, ALT, ALKPHOS, BILITOT in the last 168 hours.  Invalid input(s): GFRCGP ------------------------------------------------------------------------------------------------------------------  Cardiac Enzymes No results for input(s): TROPONINI in the last 168 hours. ------------------------------------------------------------  RADIOLOGY:   No results found for this or any previous visit. Results for orders placed during the hospital encounter of 11/28/15  DG Chest 2 View   Narrative CLINICAL DATA:  Right-sided chest pain for 3 days no fever  EXAM: CHEST  2 VIEW  COMPARISON:   08/17/2015  FINDINGS: Cardiac silhouette upper normal in size but stable. Vascular pattern normal. Lungs clear. No effusions.  IMPRESSION: No active cardiopulmonary disease.   Electronically Signed   By: Skipper Cliche M.D.   On: 11/28/2015 12:37    ------------------------------------------------------------------------------------------------------------------  Thank  you for allowing [facility] to assist in the care of your patient. Our recommendations are noted above.  Please contact us if we can be of further service.   Marda Stalker, MD.  [facility] Office Number: [phone]  Patricia Pesa, M.D.  Merton Border, M.D  10/04/2017

## 2017-10-04 NOTE — Patient Instructions (Addendum)
Restart dulera (rinse mouth after) and CPAP.  Continue spiriva.

## 2017-10-07 ENCOUNTER — Ambulatory Visit: Payer: 59 | Admitting: Obstetrics & Gynecology

## 2017-10-15 ENCOUNTER — Encounter (HOSPITAL_BASED_OUTPATIENT_CLINIC_OR_DEPARTMENT_OTHER): Payer: Self-pay

## 2017-10-15 ENCOUNTER — Other Ambulatory Visit: Payer: Self-pay

## 2017-10-15 DIAGNOSIS — D649 Anemia, unspecified: Secondary | ICD-10-CM | POA: Diagnosis not present

## 2017-10-15 DIAGNOSIS — Z87891 Personal history of nicotine dependence: Secondary | ICD-10-CM | POA: Insufficient documentation

## 2017-10-15 DIAGNOSIS — N3000 Acute cystitis without hematuria: Secondary | ICD-10-CM | POA: Diagnosis not present

## 2017-10-15 DIAGNOSIS — N3090 Cystitis, unspecified without hematuria: Secondary | ICD-10-CM | POA: Diagnosis not present

## 2017-10-15 DIAGNOSIS — Y929 Unspecified place or not applicable: Secondary | ICD-10-CM | POA: Insufficient documentation

## 2017-10-15 DIAGNOSIS — I10 Essential (primary) hypertension: Secondary | ICD-10-CM | POA: Insufficient documentation

## 2017-10-15 DIAGNOSIS — S335XXA Sprain of ligaments of lumbar spine, initial encounter: Secondary | ICD-10-CM | POA: Diagnosis not present

## 2017-10-15 DIAGNOSIS — X503XXA Overexertion from repetitive movements, initial encounter: Secondary | ICD-10-CM | POA: Insufficient documentation

## 2017-10-15 DIAGNOSIS — Y999 Unspecified external cause status: Secondary | ICD-10-CM | POA: Diagnosis not present

## 2017-10-15 DIAGNOSIS — J449 Chronic obstructive pulmonary disease, unspecified: Secondary | ICD-10-CM | POA: Diagnosis not present

## 2017-10-15 DIAGNOSIS — S39012A Strain of muscle, fascia and tendon of lower back, initial encounter: Secondary | ICD-10-CM | POA: Insufficient documentation

## 2017-10-15 DIAGNOSIS — Z79899 Other long term (current) drug therapy: Secondary | ICD-10-CM | POA: Insufficient documentation

## 2017-10-15 DIAGNOSIS — J45909 Unspecified asthma, uncomplicated: Secondary | ICD-10-CM | POA: Diagnosis not present

## 2017-10-15 DIAGNOSIS — Y9389 Activity, other specified: Secondary | ICD-10-CM | POA: Diagnosis not present

## 2017-10-15 DIAGNOSIS — S3992XA Unspecified injury of lower back, initial encounter: Secondary | ICD-10-CM | POA: Diagnosis present

## 2017-10-15 MED FILL — ESOMEPRAZOLE MAG DR 40 MG C: 40 | 30 days supply | Qty: 30 | Fill #3

## 2017-10-15 NOTE — ED Triage Notes (Signed)
Pt states had a hysterectomy 10/18, went back to work yesterday and from standing all day having bilateral hip pain, back of upper legs, lower back and RLQ at the incision site

## 2017-10-16 ENCOUNTER — Emergency Department (HOSPITAL_BASED_OUTPATIENT_CLINIC_OR_DEPARTMENT_OTHER): Payer: 59

## 2017-10-16 ENCOUNTER — Encounter (HOSPITAL_BASED_OUTPATIENT_CLINIC_OR_DEPARTMENT_OTHER): Payer: Self-pay | Admitting: Emergency Medicine

## 2017-10-16 ENCOUNTER — Emergency Department (HOSPITAL_BASED_OUTPATIENT_CLINIC_OR_DEPARTMENT_OTHER)
Admission: EM | Admit: 2017-10-16 | Discharge: 2017-10-16 | Disposition: A | Discharge status: Home / Self Care | Payer: 59 | Attending: Emergency Medicine | Admitting: Emergency Medicine

## 2017-10-16 DIAGNOSIS — Z87891 Personal history of nicotine dependence: Secondary | ICD-10-CM | POA: Diagnosis not present

## 2017-10-16 DIAGNOSIS — D649 Anemia, unspecified: Secondary | ICD-10-CM | POA: Diagnosis not present

## 2017-10-16 DIAGNOSIS — S39012A Strain of muscle, fascia and tendon of lower back, initial encounter: Secondary | ICD-10-CM

## 2017-10-16 DIAGNOSIS — I10 Essential (primary) hypertension: Secondary | ICD-10-CM | POA: Diagnosis not present

## 2017-10-16 DIAGNOSIS — N3090 Cystitis, unspecified without hematuria: Secondary | ICD-10-CM | POA: Diagnosis not present

## 2017-10-16 DIAGNOSIS — J449 Chronic obstructive pulmonary disease, unspecified: Secondary | ICD-10-CM | POA: Diagnosis not present

## 2017-10-16 DIAGNOSIS — N281 Cyst of kidney, acquired: Secondary | ICD-10-CM | POA: Diagnosis not present

## 2017-10-16 DIAGNOSIS — Z79899 Other long term (current) drug therapy: Secondary | ICD-10-CM | POA: Diagnosis not present

## 2017-10-16 DIAGNOSIS — J45909 Unspecified asthma, uncomplicated: Secondary | ICD-10-CM | POA: Diagnosis not present

## 2017-10-16 LAB — BASIC METABOLIC PANEL
Anion gap: 6 (ref 5–15)
BUN: 13 mg/dL (ref 6–20)
CO2: 27 mmol/L (ref 22–32)
Calcium: 8.4 mg/dL — ABNORMAL LOW (ref 8.9–10.3)
Chloride: 103 mmol/L (ref 101–111)
Creatinine, Ser: 0.91 mg/dL (ref 0.44–1.00)
GFR calc Af Amer: >60 mL/min (ref >60)
GLUCOSE: 120 mg/dL — AB (ref 65–99)
POTASSIUM: 4.1 mmol/L (ref 3.5–5.1)
Sodium: 136 mmol/L (ref 135–145)

## 2017-10-16 LAB — CBC WITH DIFFERENTIAL/PLATELET
BASOS ABS: 0 10*3/uL (ref 0.0–0.1)
BASOS PCT: 1 %
EOS ABS: 0.2 10*3/uL (ref 0.0–0.7)
Eosinophils Relative: 4 %
HCT: 35.2 % — ABNORMAL LOW (ref 36.0–46.0)
HEMOGLOBIN: 12 g/dL (ref 12.0–15.0)
Lymphocytes Relative: 26 %
Lymphs Abs: 1.3 10*3/uL (ref 0.7–4.0)
MCH: 31.9 pg (ref 26.0–34.0)
MCHC: 34.1 g/dL (ref 30.0–36.0)
MCV: 93.6 fL (ref 78.0–100.0)
MONOS PCT: 13 %
Monocytes Absolute: 0.7 10*3/uL (ref 0.1–1.0)
NEUTROS PCT: 56 %
Neutro Abs: 2.9 10*3/uL (ref 1.7–7.7)
Platelets: 217 10*3/uL (ref 150–400)
RBC: 3.76 MIL/uL — ABNORMAL LOW (ref 3.87–5.11)
RDW: 12.4 % (ref 11.5–15.5)
WBC: 5 10*3/uL (ref 4.0–10.5)

## 2017-10-16 LAB — URINALYSIS, ROUTINE W REFLEX MICROSCOPIC
BILIRUBIN URINE: NEGATIVE
Glucose, UA: NEGATIVE mg/dL
Hgb urine dipstick: NEGATIVE
KETONES UR: NEGATIVE mg/dL
LEUKOCYTES UA: NEGATIVE
NITRITE: NEGATIVE
PH: 6 (ref 5.0–8.0)
PROTEIN: NEGATIVE mg/dL
Specific Gravity, Urine: 1.02 (ref 1.005–1.030)

## 2017-10-16 MED ORDER — FOSFOMYCIN TROMETHAMINE 3 G PO PACK
3.0000 g | PACK | Freq: Once | ORAL | Status: AC
  Administered 2017-10-16: 3 g via ORAL
  Filled 2017-10-16: qty 3

## 2017-10-16 MED ORDER — KETOROLAC TROMETHAMINE 30 MG/ML IJ SOLN
15.0000 mg | Freq: Once | INTRAMUSCULAR | Status: AC
  Administered 2017-10-16: 15 mg via INTRAVENOUS
  Filled 2017-10-16: qty 1

## 2017-10-16 MED ORDER — METHOCARBAMOL 1000 MG/10ML IJ SOLN
1000.0000 mg | Freq: Once | INTRAMUSCULAR | Status: AC
  Administered 2017-10-16: 1000 mg via INTRAVENOUS
  Filled 2017-10-16: qty 10

## 2017-10-16 MED ORDER — MELOXICAM 15 MG PO TABS: 15.0000 mg | ORAL_TABLET | Freq: Every day | ORAL | 0 refills | Status: DC

## 2017-10-16 MED ORDER — METHOCARBAMOL 500 MG PO TABS: 500.0000 mg | ORAL_TABLET | Freq: Two times a day (BID) | ORAL | 0 refills | Status: DC

## 2017-10-16 MED FILL — METHOCARBAMOL 500 MG TABS: 500 | 10 days supply | Qty: 20 | Fill #0

## 2017-10-16 MED FILL — DULERA 200 MCG/5 MCG INH: 200-5 | 30 days supply | Qty: 13 | Fill #0

## 2017-10-16 MED FILL — MELOXICAM 15 MG TABLET: 15 | 10 days supply | Qty: 10 | Fill #0

## 2017-10-16 NOTE — ED Notes (Signed)
Patient transported to CT 

## 2017-10-16 NOTE — ED Provider Notes (Signed)
[facility] EMERGENCY DEPARTMENT Provider Note   CSN: [id number] Arrival date & time: 10/15/17  2320     History   Chief Complaint Chief Complaint  Patient presents with  . Back Pain    HPI [name] XXX Burston is a 51 y.o. female.  The history is provided by the patient.  Back Pain   This is a new problem. The current episode started 2 days ago. The problem occurs constantly. The problem has not changed since onset.Associated with: post operative and started back to work and was standing a lot at work. The pain is present in the sacro-iliac joint. Radiates to: radiates to surgical incision on the LLQ. The pain is severe. The pain is the same all the time. Pertinent negatives include no chest pain, no fever, no numbness, no weight loss, no headaches, no abdominal pain, no bowel incontinence, no perianal numbness, no bladder incontinence, no dysuria, no pelvic pain, no leg pain, no paresthesias, no paresis, no tingling and no weakness. She has tried NSAIDs for the symptoms. The treatment provided no relief. Risk factors: post op.    Past Medical History:  Diagnosis Date  . Anemia   . Anxiety   . Asthma   . COPD (chronic obstructive pulmonary disease) ([place])   . Depression   . Dysrhythmia    Irregular heat w/ asthma episodes and anxiety  . GERD (gastroesophageal reflux disease)   . Hypertension   . Migraines   . PONV (postoperative nausea and vomiting)   . Pre-diabetes   . Sleep apnea    uses CPAP  . Urinary incontinence    frequent urination- tx w/ vesicare  . Urinary urgency     Patient Active Problem List   Diagnosis Date Noted  . Restless leg syndrome 09/05/2017  . Abnormal uterine bleeding (AUB) 08/15/2017  . Chronic female pelvic pain 08/15/2017  . S/P abdominal supracervical hysterectomy, bilateral salpingectomy, left oophorectomy 08/15/2017  . Chronic migraine without aura with status migrainosus, not intractable 07/26/2017  . Sinusitis 02/25/2017  .  Left knee pain 08/29/2016  . Bunion of great toe of right foot 08/29/2016  . Preoperative cardiovascular examination 08/01/2016  . Abdominal cramping 12/30/2015  . Bladder spasms 12/30/2015  . Pre-diabetes 10/03/2015  . HTN (hypertension) 10/03/2015  . Fatigue 07/21/2015  . Urinary urgency 07/06/2015  . Flank pain 07/06/2015  . Hot flashes 10/14/2014  . Vitamin D deficiency 11/25/2013  . Cough 09/10/2013  . Back pain 06/30/2013  . Migraine 06/07/2013  . Knee pain, bilateral 10/09/2012  . Obstructive sleep apnea 09/08/2010  . HLD (hyperlipidemia) 09/20/2008  . Depression with anxiety 12/26/2006  . RHINITIS, ALLERGIC 12/26/2006  . Asthma 12/26/2006    Past Surgical History:  Procedure Laterality Date  . ABDOMINAL HYSTERECTOMY Bilateral 08/15/2017   Procedure: SUPRACERVICAL HYSTERECTOMY ABDOMINAL W/ BILATERAL SALPINGECTOMY AND LEFT OOPHORECTOMY;  Surgeon: Osborne Oman, MD;  Location: [facility];  Service: Gynecology;  Laterality: Bilateral;  . ABLATION    . CESAREAN SECTION     x3  . DILATION AND CURETTAGE OF UTERUS  05/31/2005   MAB, TAB x 2  . HERNIA REPAIR  1990's  . HYSTEROSCOPY W/ ENDOMETRIAL ABLATION    . LEEP    . MAB     x 4  . MOUTH SURGERY     teeth extraction  . TUBAL LIGATION  11/01/2006  . WISDOM TOOTH EXTRACTION      OB History    Gravida Para Term Preterm AB Living  15 8 8  0 7 4   SAB TAB Ectopic Multiple Live Births   2 1             Home Medications    Prior to Admission medications   Medication Sig Start Date End Date Taking? Authorizing Provider  albuterol (PROVENTIL) (2.5 MG/3ML) 0.083% nebulizer solution Take 3 mLs (2.5 mg total) every 4 (four) hours as needed by nebulization. 09/05/17   Caroline More, DO  baclofen (LIORESAL) 10 MG tablet Take 1 tablet (10 mg total) by mouth 3 (three) times daily as needed (headaches). 02/17/16   Karamalegos, Devonne Doughty, DO  BIOTIN PO Take 2,000 mg by mouth daily.    [provider]  cetirizine  (ZYRTEC) 10 MG tablet Take 1 tablet (10 mg total) daily by mouth. 09/05/17   Caroline More, DO  cyclobenzaprine (FLEXERIL) 10 MG tablet Take 0.5-1 tablets (5-10 mg total) by mouth every 8 (eight) hours as needed for muscle spasms. 08/30/17   Leftwich-Kirby, Kathie Dike, CNM  diclofenac (VOLTAREN) 75 MG EC tablet Take 1 tablet (75 mg total) by mouth 2 (two) times daily with a meal. 05/28/17   Truett Mainland, DO  eletriptan (RELPAX) 40 MG tablet Take 1 tablet (40 mg total) by mouth as needed for migraine or headache. May repeat in 2 hours if headache persists or recurs. 07/26/17   Melvenia Beam, MD  escitalopram (LEXAPRO) 20 MG tablet Take 1 tablet (20 mg total) daily by mouth. 09/05/17   Caroline More, DO  esomeprazole (NEXIUM) 40 MG capsule TAKE 1 CAPSULE BY MOUTH ONCE DAILY 02/13/17   Virginia Crews, MD  estrogen-methylTESTOSTERone 0.625-1.25 MG tablet Take 1 tablet daily by mouth. 09/11/17   Anyanwu, Sallyanne Havers, MD  fluticasone (FLONASE) 50 MCG/ACT nasal spray PLACE 2 SPRAYS INTO THE NOSE DAILY. 09/05/17   Caroline More, DO  furosemide (LASIX) 20 MG tablet TAKE 1 TABLET BY MOUTH DAILY. 11/21/16   Bacigalupo, Dionne Bucy, MD  gabapentin (NEURONTIN) 300 MG capsule Take 1 capsule (300 mg total) by mouth every 8 (eight) hours as needed (PAIN). Can take 600 mg at night prn hot flashes 08/16/17   Anyanwu, Sallyanne Havers, MD  losartan (COZAAR) 50 MG tablet Take 1 tablet (50 mg total) daily by mouth. 09/05/17   Caroline More, DO  meloxicam (MOBIC) 15 MG tablet Take 1 tablet (15 mg total) by mouth daily. 10/16/17   Cardarius Senat, MD  methocarbamol (ROBAXIN) 500 MG tablet Take 1 tablet (500 mg total) by mouth 2 (two) times daily. 10/16/17   Elisse Pennick, MD  mometasone-formoterol (DULERA) 200-5 MCG/ACT AERO Inhale 2 puffs into the lungs 2 (two) times daily. 10/04/17   Laverle Hobby, MD  montelukast (SINGULAIR) 10 MG tablet Take 1 tablet (10 mg total) by mouth at bedtime. 12/30/15   Virginia Crews, MD    Multiple Vitamin (MULTIVITAMIN WITH MINERALS) TABS tablet Take 1 tablet by mouth daily.    [provider]  NONFORMULARY OR COMPOUNDED ITEM Inject 1 application every 30 (thirty) days into the skin. 09/10/17   Melvenia Beam, MD  ondansetron (ZOFRAN) 4 MG tablet Take 1 tablet (4 mg total) by mouth every 8 (eight) hours as needed for nausea or vomiting. 05/29/17   Rasch, Anderson Malta I, NP  oxybutynin (DITROPAN XL) 10 MG 24 hr tablet Take 1 tablet (10 mg total) at bedtime by mouth. 09/05/17   Caroline More, DO  propranolol ER (INDERAL LA) 80 MG 24 hr capsule Take 1 capsule (80 mg  total) by mouth daily. 12/30/15   Bacigalupo, Dionne Bucy, MD  Pyridoxine HCl (VITAMIN B-6 PO) Take 1 tablet by mouth daily. Unknown OTC strength    [provider]  simvastatin (ZOCOR) 40 MG tablet TAKE 1 TABLET BY MOUTH AT BEDTIME. 11/21/16   Bacigalupo, Dionne Bucy, MD  Spacer/Aero-Holding Chambers (AEROCHAMBER MV) inhaler Use as instructed 05/09/15   Vilinda Boehringer, MD  SUMAtriptan (IMITREX) 50 MG tablet Take 50 mg by mouth every 2 (two) hours as needed for migraine. May repeat in 2 hours if headache persists or recurs.    [provider]  Tiotropium Bromide Monohydrate (SPIRIVA RESPIMAT) 2.5 MCG/ACT AERS Inhale 2 puffs into the lungs daily. 11/22/16   Nicolette Bang, DO  traMADol (ULTRAM) 50 MG tablet Take 2 tablets (100 mg total) by mouth every 6 (six) hours as needed. Patient taking differently: Take 100 mg by mouth every 6 (six) hours as needed for moderate pain.  06/06/17   Anyanwu, Sallyanne Havers, MD  vitamin C (ASCORBIC ACID) 500 MG tablet Take 500 mg by mouth daily.     [provider]    Family History Family History  Problem Relation Age of Onset  . Diabetes Maternal Grandmother   . Anesthesia problems Neg Hx   . Headache Neg Hx   . Aneurysm Neg Hx     Social History Social History   Tobacco Use  . Smoking status: Former Smoker    Packs/day: 0.25    Years: 15.00    Pack  years: 3.75    Types: Cigarettes    Last attempt to quit: 02/16/2003    Years since quitting: 14.6  . Smokeless tobacco: Never Used  Substance Use Topics  . Alcohol use: Yes    Comment: socially  . Drug use: No     Allergies   Amitriptyline; Fluticasone-salmeterol; Penicillins; Ciprofloxacin; Macrobid [nitrofurantoin monohyd macro]; and Sulfamethoxazole-trimethoprim   Review of Systems Review of Systems  Constitutional: Negative for fever and weight loss.  Cardiovascular: Negative for chest pain.  Gastrointestinal: Negative for abdominal pain, bowel incontinence, nausea and vomiting.  Genitourinary: Positive for frequency. Negative for bladder incontinence, dysuria, genital sores and pelvic pain.  Musculoskeletal: Positive for back pain.  Neurological: Negative for tingling, weakness, numbness, headaches and paresthesias.  All other systems reviewed and are negative.    Physical Exam Updated Vital Signs BP 110/71 (BP Location: Right Arm)   Pulse (!) 58   Temp 97.9 F (36.6 C) (Oral)   Resp 18   Ht 5\' 1"  (1.549 m)   Wt 86.2 kg (190 lb)   LMP 05/21/2017 (Within Weeks)   SpO2 99%   BMI 35.90 kg/m   Physical Exam  Constitutional: She is oriented to person, place, and time. She appears well-developed and well-nourished. No distress.  HENT:  Head: Normocephalic and atraumatic.  Mouth/Throat: No oropharyngeal exudate.  Eyes: Conjunctivae are normal. Pupils are equal, round, and reactive to light.  Neck: Normal range of motion. Neck supple.  Cardiovascular: Normal rate, regular rhythm, normal heart sounds and intact distal pulses.  Pulmonary/Chest: Effort normal and breath sounds normal. No stridor. She has no wheezes. She has no rales.  Abdominal: Soft. Bowel sounds are normal. She exhibits no mass. There is no tenderness. There is no rebound and no guarding.  Incision CDI  Musculoskeletal: Normal range of motion.  Neurological: She is alert and oriented to person,  place, and time. She displays normal reflexes.  Skin: Skin is warm and dry. Capillary refill takes  less than 2 seconds.  Psychiatric: She has a normal mood and affect.  Nursing note and vitals reviewed.    ED Treatments / Results  Labs (all labs ordered are listed, but only abnormal results are displayed)  Results for orders placed or performed during the hospital encounter of 10/16/17  Urinalysis, Routine w reflex microscopic  Result Value Ref Range   Color, Urine YELLOW YELLOW   APPearance CLEAR CLEAR   Specific Gravity, Urine 1.020 1.005 - 1.030   pH 6.0 5.0 - 8.0   Glucose, UA NEGATIVE NEGATIVE mg/dL   Hgb urine dipstick NEGATIVE NEGATIVE   Bilirubin Urine NEGATIVE NEGATIVE   Ketones, ur NEGATIVE NEGATIVE mg/dL   Protein, ur NEGATIVE NEGATIVE mg/dL   Nitrite NEGATIVE NEGATIVE   Leukocytes, UA NEGATIVE NEGATIVE  CBC with Differential/Platelet  Result Value Ref Range   WBC 5.0 4.0 - 10.5 K/uL   RBC 3.76 (L) 3.87 - 5.11 MIL/uL   Hemoglobin 12.0 12.0 - 15.0 g/dL   HCT 35.2 (L) 36.0 - 46.0 %   MCV 93.6 78.0 - 100.0 fL   MCH 31.9 26.0 - 34.0 pg   MCHC 34.1 30.0 - 36.0 g/dL   RDW 12.4 11.5 - 15.5 %   Platelets 217 150 - 400 K/uL   Neutrophils Relative % 56 %   Neutro Abs 2.9 1.7 - 7.7 K/uL   Lymphocytes Relative 26 %   Lymphs Abs 1.3 0.7 - 4.0 K/uL   Monocytes Relative 13 %   Monocytes Absolute 0.7 0.1 - 1.0 K/uL   Eosinophils Relative 4 %   Eosinophils Absolute 0.2 0.0 - 0.7 K/uL   Basophils Relative 1 %   Basophils Absolute 0.0 0.0 - 0.1 K/uL  Basic metabolic panel  Result Value Ref Range   Sodium 136 135 - 145 mmol/L   Potassium 4.1 3.5 - 5.1 mmol/L   Chloride 103 101 - 111 mmol/L   CO2 27 22 - 32 mmol/L   Glucose, Bld 120 (H) 65 - 99 mg/dL   BUN 13 6 - 20 mg/dL   Creatinine, Ser 0.91 0.44 - 1.00 mg/dL   Calcium 8.4 (L) 8.9 - 10.3 mg/dL   GFR calc non Af Amer >60 >60 mL/min   GFR calc Af Amer >60 >60 mL/min   Anion gap 6 5 - 15   Ct Renal Stone  Study  Result Date: 10/16/2017 CLINICAL DATA:  Lower abdominal pain EXAM: CT ABDOMEN AND PELVIS WITHOUT CONTRAST TECHNIQUE: Multidetector CT imaging of the abdomen and pelvis was performed following the standard protocol without oral or intravenous contrast maternal administration. COMPARISON:  Mar 14, 2016 FINDINGS: Lower chest: Lung bases are clear. Hepatobiliary: Multiple cysts are seen throughout the liver, stable. The largest individual cyst arises from the lateral segment of the left lobe of the liver measuring 5.6 x 4.8 cm. A cyst in the medial segment of the left lobe of the liver measures 4.8 x 4.7 cm. No noncystic liver lesions are evident on this noncontrast enhanced study. Gallbladder wall is not appreciably thickened. There is no biliary duct dilatation. Pancreas: No pancreatic mass or inflammatory focus. Spleen: No splenic lesions are evident. Adrenals/Urinary Tract: Adrenals appear normal bilaterally. There is no evident renal mass or hydronephrosis on either side. There is no renal or ureteral calculus on either side. Urinary bladder is midline. There is borderline thickening of the wall of the urinary bladder. Stomach/Bowel: There is no appreciable bowel wall or mesenteric thickening. No evident bowel obstruction. No free air or  portal venous air. Vascular/Lymphatic: There is no abdominal aortic aneurysm. There is mild calcification in the abdominal aorta. Major mesenteric vessels appear patent on this noncontrast enhanced study. There is no appreciable adenopathy in the abdomen or pelvis. Reproductive: Uterus and left ovary are absent. There is a 2.6 x 2.6 cm probable dominant follicle arising from the right ovary. No other pelvic mass evident. Other: Appendix appears normal. There is no abscess or ascites evident abdomen or pelvis. There is mild fat in each inguinal ring. Musculoskeletal: There is scarring in the periumbilical region. There is more extensive scarring in the midline anterior  lower pelvis. No fluid collection or abscess seen in the abdominal wall. There are no blastic or lytic bone lesions. No intramuscular lesions are evident. IMPRESSION: 1. Areas of scarring in the anterior abdominal and pelvic wall, felt to be of postoperative etiology. No abdominal wall abscess or fluid collection. 2. Mild urinary bladder wall thickening. Suspect a degree of cystitis. 3. No bowel wall thickening or bowel obstruction. No evident abscess. Appendix appears normal. 4.  No renal or ureteral calculus.  No hydronephrosis. 5.  Probable dominant follicle arising from right ovary. 6.  Multiple liver cysts again noted. Electronically Signed   By: Lowella Grip III M.D.   On: 10/16/2017 01:52     Radiology Ct Renal Stone Study  Result Date: 10/16/2017 CLINICAL DATA:  Lower abdominal pain EXAM: CT ABDOMEN AND PELVIS WITHOUT CONTRAST TECHNIQUE: Multidetector CT imaging of the abdomen and pelvis was performed following the standard protocol without oral or intravenous contrast maternal administration. COMPARISON:  Mar 14, 2016 FINDINGS: Lower chest: Lung bases are clear. Hepatobiliary: Multiple cysts are seen throughout the liver, stable. The largest individual cyst arises from the lateral segment of the left lobe of the liver measuring 5.6 x 4.8 cm. A cyst in the medial segment of the left lobe of the liver measures 4.8 x 4.7 cm. No noncystic liver lesions are evident on this noncontrast enhanced study. Gallbladder wall is not appreciably thickened. There is no biliary duct dilatation. Pancreas: No pancreatic mass or inflammatory focus. Spleen: No splenic lesions are evident. Adrenals/Urinary Tract: Adrenals appear normal bilaterally. There is no evident renal mass or hydronephrosis on either side. There is no renal or ureteral calculus on either side. Urinary bladder is midline. There is borderline thickening of the wall of the urinary bladder. Stomach/Bowel: There is no appreciable bowel wall or  mesenteric thickening. No evident bowel obstruction. No free air or portal venous air. Vascular/Lymphatic: There is no abdominal aortic aneurysm. There is mild calcification in the abdominal aorta. Major mesenteric vessels appear patent on this noncontrast enhanced study. There is no appreciable adenopathy in the abdomen or pelvis. Reproductive: Uterus and left ovary are absent. There is a 2.6 x 2.6 cm probable dominant follicle arising from the right ovary. No other pelvic mass evident. Other: Appendix appears normal. There is no abscess or ascites evident abdomen or pelvis. There is mild fat in each inguinal ring. Musculoskeletal: There is scarring in the periumbilical region. There is more extensive scarring in the midline anterior lower pelvis. No fluid collection or abscess seen in the abdominal wall. There are no blastic or lytic bone lesions. No intramuscular lesions are evident. IMPRESSION: 1. Areas of scarring in the anterior abdominal and pelvic wall, felt to be of postoperative etiology. No abdominal wall abscess or fluid collection. 2. Mild urinary bladder wall thickening. Suspect a degree of cystitis. 3. No bowel wall thickening or bowel obstruction. No  evident abscess. Appendix appears normal. 4.  No renal or ureteral calculus.  No hydronephrosis. 5.  Probable dominant follicle arising from right ovary. 6.  Multiple liver cysts again noted. Electronically Signed   By: Lowella Grip III M.D.   On: 10/16/2017 01:52    Procedures Procedures (including critical care time)  Medications Ordered in ED Medications  ketorolac (TORADOL) 30 MG/ML injection 15 mg (15 mg Intravenous Given 10/16/17 0118)  methocarbamol (ROBAXIN) injection 1,000 mg (1,000 mg Intravenous Given 10/16/17 0124)  fosfomycin (MONUROL) packet 3 g (3 g Oral Given 10/16/17 0212)     Final Clinical Impressions(s) / ED Diagnoses   Final diagnoses:  Strain of lumbar region, initial encounter  Cystitis   Pain is MSK in  nature but given the bladder thickening seen on CT have given one dose of fosfomycin.   Return for weakness, fevers > 101, stiff neck, intractable vomiting, or diarrhea, abdominal pain, Inability to tolerate liquids or food, cough, altered mental status or any concerns. No signs of systemic illness or infection. The patient is nontoxic-appearing on exam and vital signs are within normal limits.    I have reviewed the triage vital signs and the nursing notes. Pertinent labs &imaging results that were available during my care of the patient were reviewed by me and considered in my medical decision making (see chart for details).  After history, exam, and medical workup I feel the patient has been appropriately medically screened and is safe for discharge home. Pertinent diagnoses were discussed with the patient. Patient was given return precautions  ED Discharge Orders        Ordered    methocarbamol (ROBAXIN) 500 MG tablet  2 times daily     10/16/17 0206    meloxicam (MOBIC) 15 MG tablet  Daily     10/16/17 0206       Kamare Caspers, MD 10/16/17 [phone]

## 2017-10-16 NOTE — ED Notes (Signed)
Pt denies any vag discharge or urinary symptoms. Pt's first day back to work was yesterday, pt worked 12 hours but denies any injury. Pt reports pain to her RLQ, LLQ, and lower bilat back pain that radiates down her legs. Pt has taken ibuprofen with no relief.

## 2017-10-17 NOTE — Telephone Encounter (Signed)
error 

## 2017-10-23 ENCOUNTER — Ambulatory Visit (INDEPENDENT_AMBULATORY_CARE_PROVIDER_SITE_OTHER): Payer: 59 | Admitting: Obstetrics & Gynecology

## 2017-10-23 ENCOUNTER — Encounter: Payer: Self-pay | Admitting: Obstetrics & Gynecology

## 2017-10-23 VITALS — BP 138/84 | HR 70 | Ht 61.0 in | Wt 192.4 lb

## 2017-10-23 DIAGNOSIS — Z09 Encounter for follow-up examination after completed treatment for conditions other than malignant neoplasm: Secondary | ICD-10-CM

## 2017-10-23 DIAGNOSIS — Z1231 Encounter for screening mammogram for malignant neoplasm of breast: Secondary | ICD-10-CM

## 2017-10-23 DIAGNOSIS — G8918 Other acute postprocedural pain: Secondary | ICD-10-CM

## 2017-10-23 DIAGNOSIS — Z90711 Acquired absence of uterus with remaining cervical stump: Secondary | ICD-10-CM

## 2017-10-23 NOTE — Progress Notes (Signed)
Here for follow up.Reports started back to work and worked 12 hours and had a lot of pain.

## 2017-10-23 NOTE — Progress Notes (Signed)
GYNECOLOGY OFFICE VISIT NOTE  History:  51 y.o. [id number] here today for follow up after [facility].  Still on Estrogen-Methyltesterone for vasomotor symptoms. Had increased pain after returning to work and standing for a 12 hour shift; went to ER and had negative evaluation and imaging. Reports occasional pain on left side of incision, controlled on NSAIDs.  She denies any abnormal vaginal discharge, bleeding, pelvic pain or other concerns.   Past Medical History:  Diagnosis Date  . Anemia   . Anxiety   . Asthma   . COPD (chronic obstructive pulmonary disease) ([place])   . Depression   . Dysrhythmia    Irregular heat w/ asthma episodes and anxiety  . GERD (gastroesophageal reflux disease)   . Hypertension   . Migraines   . PONV (postoperative nausea and vomiting)   . Pre-diabetes   . Sleep apnea    uses CPAP  . Urinary incontinence    frequent urination- tx w/ vesicare  . Urinary urgency     Past Surgical History:  Procedure Laterality Date  . ABDOMINAL HYSTERECTOMY Bilateral 08/15/2017   Procedure: SUPRACERVICAL HYSTERECTOMY ABDOMINAL W/ BILATERAL SALPINGECTOMY AND LEFT OOPHORECTOMY;  Surgeon: Osborne Oman, MD;  Location: [facility];  Service: Gynecology;  Laterality: Bilateral;  . ABLATION    . CESAREAN SECTION     x3  . DILATION AND CURETTAGE OF UTERUS  05/31/2005   MAB, TAB x 2  . HERNIA REPAIR  1990's  . HYSTEROSCOPY W/ ENDOMETRIAL ABLATION    . LEEP    . MAB     x 4  . MOUTH SURGERY     teeth extraction  . TUBAL LIGATION  11/01/2006  . WISDOM TOOTH EXTRACTION      The following portions of the patient's history were reviewed and updated as appropriate: allergies, current medications, past family history, past medical history, past social history, past surgical history and problem list.   Health Maintenance:  Normal pap and negative HRHPV on 05/08/2017.  Normal mammogram on 12/13/2015.   Review of Systems:  Pertinent items noted in HPI and remainder of comprehensive ROS  otherwise negative.  Objective:  Physical Exam BP 138/84   Pulse 70   Ht 5\' 1"  (1.549 m)   Wt 192 lb 6.4 oz (87.3 kg)   LMP 05/21/2017 (Within Weeks)   BMI 36.35 kg/m  CONSTITUTIONAL: Well-developed, well-nourished female in no acute distress.  HENT:  Normocephalic, atraumatic. External right and left ear normal. Oropharynx is clear and moist EYES: Conjunctivae and EOM are normal. Pupils are equal, round, and reactive to light. No scleral icterus.  NECK: Normal range of motion, supple, no masses SKIN: Skin is warm and dry. No rash noted. Not diaphoretic. No erythema. No pallor. NEUROLOGIC: Alert and oriented to person, place, and time. Normal reflexes, muscle tone coordination. No cranial nerve deficit noted. PSYCHIATRIC: Normal mood and affect. Normal behavior. Normal judgment and thought content. CARDIOVASCULAR: Normal heart rate noted RESPIRATORY: Effort and breath sounds normal, no problems with respiration noted ABDOMEN: Soft, no distention noted.  Incision is well-healed. No abnormalities on her left.  PELVIC: Deferred MUSCULOSKELETAL: Normal range of motion. No edema noted.  Labs and Imaging Ct Renal Stone Study  Result Date: 10/16/2017 CLINICAL DATA:  Lower abdominal pain EXAM: CT ABDOMEN AND PELVIS WITHOUT CONTRAST TECHNIQUE: Multidetector CT imaging of the abdomen and pelvis was performed following the standard protocol without oral or intravenous contrast maternal administration. COMPARISON:  Mar 14, 2016 FINDINGS: Lower chest: Lung bases are clear.  Hepatobiliary: Multiple cysts are seen throughout the liver, stable. The largest individual cyst arises from the lateral segment of the left lobe of the liver measuring 5.6 x 4.8 cm. A cyst in the medial segment of the left lobe of the liver measures 4.8 x 4.7 cm. No noncystic liver lesions are evident on this noncontrast enhanced study. Gallbladder wall is not appreciably thickened. There is no biliary duct dilatation. Pancreas: No  pancreatic mass or inflammatory focus. Spleen: No splenic lesions are evident. Adrenals/Urinary Tract: Adrenals appear normal bilaterally. There is no evident renal mass or hydronephrosis on either side. There is no renal or ureteral calculus on either side. Urinary bladder is midline. There is borderline thickening of the wall of the urinary bladder. Stomach/Bowel: There is no appreciable bowel wall or mesenteric thickening. No evident bowel obstruction. No free air or portal venous air. Vascular/Lymphatic: There is no abdominal aortic aneurysm. There is mild calcification in the abdominal aorta. Major mesenteric vessels appear patent on this noncontrast enhanced study. There is no appreciable adenopathy in the abdomen or pelvis. Reproductive: Uterus and left ovary are absent. There is a 2.6 x 2.6 cm probable dominant follicle arising from the right ovary. No other pelvic mass evident. Other: Appendix appears normal. There is no abscess or ascites evident abdomen or pelvis. There is mild fat in each inguinal ring. Musculoskeletal: There is scarring in the periumbilical region. There is more extensive scarring in the midline anterior lower pelvis. No fluid collection or abscess seen in the abdominal wall. There are no blastic or lytic bone lesions. No intramuscular lesions are evident. IMPRESSION: 1. Areas of scarring in the anterior abdominal and pelvic wall, felt to be of postoperative etiology. No abdominal wall abscess or fluid collection. 2. Mild urinary bladder wall thickening. Suspect a degree of cystitis. 3. No bowel wall thickening or bowel obstruction. No evident abscess. Appendix appears normal. 4.  No renal or ureteral calculus.  No hydronephrosis. 5.  Probable dominant follicle arising from right ovary. 6.  Multiple liver cysts again noted. Electronically Signed   By: Lowella Grip III M.D.   On: 10/16/2017 01:52    Assessment & Plan:  1. Postop check 2. Postoperative pain 3. S/P abdominal  supracervical hysterectomy, bilateral salpingectomy, left oophorectomy Doing well. Continue NSAIDs as needed. No other postoperative concerns. Continue HRT.   4. Visit for screening mammogram - MM SCREENING BREAST TOMO BILATERAL; Future  Routine preventative health maintenance measures emphasized. Please refer to After Visit Summary for other counseling recommendations.   Return for any GYN concerns.   Total face-to-face time with patient: 15 minutes.  Over 50% of encounter was spent on counseling and coordination of care.   Verita Schneiders, MD, [place] for [employer], [place]

## 2017-10-23 NOTE — Patient Instructions (Signed)
Return to clinic for any scheduled appointments or for any gynecologic concerns as needed.   

## 2017-10-25 MED FILL — LOSARTAN POTASSIUM 50 MG TA: 50 | 30 days supply | Qty: 30 | Fill #1

## 2017-11-04 MED FILL — ESCITALOPRAM 20 MG TABLET: 20 | 30 days supply | Qty: 30 | Fill #1

## 2017-11-04 MED FILL — SPIRIVA RESPIMAT INHAL SPRY: 2.5 | 30 days supply | Qty: 4 | Fill #2

## 2017-11-04 MED FILL — AJOVY 225 MG/1.5ML SOSY: 225 | 30 days supply | Qty: 2 | Fill #1

## 2017-11-04 MED FILL — EEMT HS 0.625-1.25 MG TAB: 0.625-1.25 | 30 days supply | Qty: 30 | Fill #2

## 2017-11-04 MED FILL — VENTOLIN HFA 90 MCG INHALER: 108 (90 BAS | 16 days supply | Qty: 18 | Fill #3

## 2017-11-14 NOTE — Telephone Encounter (Signed)
[name] with [facility] called and said pt has gotten Ajovy from the pharmacy.

## 2017-11-14 NOTE — Telephone Encounter (Addendum)
Ajovy approved! [facility] to discuss. LVM asking for call back.

## 2017-11-20 ENCOUNTER — Ambulatory Visit
Admission: RE | Admit: 2017-11-20 | Discharge: 2017-11-20 | Disposition: A | Discharge status: Home / Self Care | Payer: 59 | Source: Ambulatory Visit | Attending: Obstetrics & Gynecology | Admitting: Obstetrics & Gynecology

## 2017-11-20 DIAGNOSIS — Z1231 Encounter for screening mammogram for malignant neoplasm of breast: Secondary | ICD-10-CM

## 2017-12-02 DIAGNOSIS — Z0289 Encounter for other administrative examinations: Secondary | ICD-10-CM

## 2017-12-06 ENCOUNTER — Telehealth: Payer: Self-pay | Admitting: Neurology

## 2017-12-06 NOTE — Telephone Encounter (Signed)
Patient states Dr. Jaynee Eagles told her to come back for a follow-up once she came back to work. Patient has requested a follow-up.

## 2017-12-09 ENCOUNTER — Telehealth: Payer: Self-pay | Admitting: *Deleted

## 2017-12-09 NOTE — Telephone Encounter (Signed)
Can you schedule her with Jinny Blossom first available please? thanks

## 2017-12-09 NOTE — Telephone Encounter (Signed)
Pt [employer] form on [place] desk. 

## 2017-12-09 NOTE — Telephone Encounter (Addendum)
Megan's soonest availability is on 3/20 @ 9:00 AM. RN was going to offer patient that appt unless another appt opens earlier. Called patient and LVM asking for call back.

## 2017-12-11 ENCOUNTER — Other Ambulatory Visit: Payer: Self-pay | Admitting: Internal Medicine

## 2017-12-11 ENCOUNTER — Encounter: Payer: Self-pay | Admitting: Family Medicine

## 2017-12-11 ENCOUNTER — Other Ambulatory Visit: Payer: Self-pay | Admitting: Family Medicine

## 2017-12-11 ENCOUNTER — Ambulatory Visit (INDEPENDENT_AMBULATORY_CARE_PROVIDER_SITE_OTHER): Payer: 59 | Admitting: Family Medicine

## 2017-12-11 ENCOUNTER — Other Ambulatory Visit: Payer: Self-pay

## 2017-12-11 VITALS — BP 130/76 | HR 66 | Temp 98.0°F | Ht 61.0 in | Wt 197.2 lb

## 2017-12-11 DIAGNOSIS — R350 Frequency of micturition: Secondary | ICD-10-CM

## 2017-12-11 DIAGNOSIS — M549 Dorsalgia, unspecified: Secondary | ICD-10-CM | POA: Diagnosis not present

## 2017-12-11 DIAGNOSIS — H5213 Myopia, bilateral: Secondary | ICD-10-CM | POA: Diagnosis not present

## 2017-12-11 LAB — POCT URINALYSIS DIP (MANUAL ENTRY)
BILIRUBIN UA: NEGATIVE
BILIRUBIN UA: NEGATIVE mg/dL
Blood, UA: NEGATIVE
Glucose, UA: NEGATIVE mg/dL
LEUKOCYTES UA: NEGATIVE
Nitrite, UA: NEGATIVE
PH UA: 7 (ref 5.0–8.0)
PROTEIN UA: NEGATIVE mg/dL
SPEC GRAV UA: 1.015 (ref 1.010–1.025)
Urobilinogen, UA: 0.2 E.U./dL

## 2017-12-11 MED ORDER — CEPHALEXIN 500 MG PO CAPS: 500.0000 mg | ORAL_CAPSULE | Freq: Two times a day (BID) | ORAL | 0 refills | Status: AC

## 2017-12-11 MED ORDER — NAPROXEN 500 MG PO TABS: 500.0000 mg | ORAL_TABLET | Freq: Two times a day (BID) | ORAL | 0 refills | Status: DC | PRN

## 2017-12-11 MED FILL — EEMT HS 0.625-1.25 MG TAB: 0.625-1.25 | 90 days supply | Qty: 90 | Fill #0

## 2017-12-11 MED FILL — NAPROXEN 500 MG TABLET: 500 | 30 days supply | Qty: 60 | Fill #0

## 2017-12-11 MED FILL — ESCITALOPRAM 20 MG TABLET: 20 | 30 days supply | Qty: 30 | Fill #2

## 2017-12-11 MED FILL — LOSARTAN POTASSIUM 50 MG TA: 50 | 60 days supply | Qty: 60 | Fill #0

## 2017-12-11 MED FILL — AJOVY 225 MG/1.5ML SOSY: 225 | 30 days supply | Qty: 2 | Fill #2

## 2017-12-11 MED FILL — VENTOLIN HFA 90 MCG INHALER: 108 (90 BAS | 17 days supply | Qty: 18 | Fill #0

## 2017-12-11 MED FILL — CEPHALEXIN 500 MG CAPSULE: 500 | 7 days supply | Qty: 14 | Fill #0

## 2017-12-11 MED FILL — ESOMEPRAZOLE MAG DR 40 MG C: 40 | 30 days supply | Qty: 30 | Fill #4

## 2017-12-11 NOTE — Progress Notes (Signed)
Subjective:    Patient ID: [name] , female   DOB: [birth year] , 52 y.o..   MRN: [id number]  HPI  [name] is a 52 year old female with past medical history of hypertension, migraine, asthma, OSA, sinusitis, back pain here for  Chief Complaint  Patient presents with  . Back Pain    1. Back pain and increased urinary frequency  Back pain began 4 days ago, in lower back "from kidney area down". Pain is described as stabbing. Patient has not tried anything. Pain radiates to her abdomen. History of trauma or injury: No  Patient believes might be causing their pain: UTI  Prior history of similar pain: yes, when she gets a UTI History of cancer: no Weak immune system:  no History of IV drug use: no History of steroid use: no  Symptoms Incontinence of bowel or bladder:  No but endorses increased urinary frequency. She feels like she is getting a UTI She notes that she is out of her oxybutynin. She has been taking this every night before bed. She thinks that it's helped in the past. Admits to some suprapubic pain. She notes she had a hysterectomy since October. She was given Tramadol after this surgery and Naproxen, she is out of both of them since December Numbness of leg: No Fever: No  Rest or Night pain: No Weight Loss:  No Rash: No  ROS see HPI Smoking Status noted.  Past Medical History: Patient Active Problem List   Diagnosis Date Noted  . Restless leg syndrome 09/05/2017  . Abnormal uterine bleeding (AUB) 08/15/2017  . Chronic female pelvic pain 08/15/2017  . S/P abdominal supracervical hysterectomy, bilateral salpingectomy, left oophorectomy 08/15/2017  . Chronic migraine without aura with status migrainosus, not intractable 07/26/2017  . Sinusitis 02/25/2017  . Left knee pain 08/29/2016  . Bunion of great toe of right foot 08/29/2016  . Preoperative cardiovascular examination 08/01/2016  . Abdominal cramping 12/30/2015  . Bladder spasms 12/30/2015  .  Pre-diabetes 10/03/2015  . HTN (hypertension) 10/03/2015  . Fatigue 07/21/2015  . Urinary urgency 07/06/2015  . Flank pain 07/06/2015  . Hot flashes 10/14/2014  . Vitamin D deficiency 11/25/2013  . Cough 09/10/2013  . Back pain 06/30/2013  . Migraine 06/07/2013  . Knee pain, bilateral 10/09/2012  . Obstructive sleep apnea 09/08/2010  . HLD (hyperlipidemia) 09/20/2008  . Depression with anxiety 12/26/2006  . RHINITIS, ALLERGIC 12/26/2006  . Asthma 12/26/2006    Medications: reviewed   Social Hx:  reports that she quit smoking about 14 years ago. Her smoking use included cigarettes. She has a 3.75 pack-year smoking history. she has never used smokeless tobacco.   Objective:   BP 130/76   Pulse 66   Temp 98 F (36.7 C) (Oral)   Ht 5\' 1"  (1.549 m)   Wt 197 lb 3.2 oz (89.4 kg)   LMP 05/21/2017 (Within Weeks)   SpO2 98%   BMI 37.26 kg/m  Physical Exam  Gen: NAD, alert, cooperative with exam, well-appearing HEENT: NCAT, PERRL, clear conjunctiva, oropharynx clear, supple neck Cardiac: Regular rate and rhythm, normal S1/S2, no murmur, no edema, capillary refill brisk  Respiratory: Clear to auscultation bilaterally, no wheezes, non-labored breathing Gastrointestinal: soft, tender to palpation in suprapubic region Skin: no rashes, normal turgor  Neurological: no gross deficits.  Back: Normal to inspection, normal range of motion, tender to palpation in bilateral CVA, 5 out of 5 strength throughout, neurovascularly intact  Results for orders placed or performed in  visit on 12/11/17  POCT urinalysis dipstick  Result Value Ref Range   Color, UA yellow yellow   Clarity, UA clear clear   Glucose, UA negative negative mg/dL   Bilirubin, UA negative negative   Ketones, POC UA negative negative mg/dL   Spec Grav, UA 1.015 1.010 - 1.025   Blood, UA negative negative   pH, UA 7.0 5.0 - 8.0   Protein Ur, POC negative negative mg/dL   Urobilinogen, UA 0.2 0.2 or 1.0 E.U./dL    Nitrite, UA Negative Negative   Leukocytes, UA Negative Negative    Assessment & Plan:  Back pain Patient is tender in bilateral CVA region.  No red flag symptoms from back pain and she is afebrile.  Back pain could be from MSK muscle strain.  She also has some suprapubic pain and increased urinary frequency.  Feel that this is less likely UTI given her normal urinalysis.  However she is insistent that her symptoms do align with previous UTIs.  Given her symptoms we will treat for possible UTI with and obtain a urine culture.  More likely her history of bladder spasms and urge incontinence are contributing to her urinary issues.  Status post hysterectomy, no PID. - Keflex 500 mg twice daily x 7-day course -Urine culture obtained -Naproxen 500 mg twice daily as needed for pelvic pain -Discussed that if her symptoms do not start to improve over the next 3 days and she will need to follow-up in our clinic - She is continuing to take oxybutynin for urge incontinence, can consider referral to pelvic rehab and referral to urology  Orders Placed This Encounter  Procedures  . Urine Culture  . POCT urinalysis dipstick   Meds ordered this encounter  Medications  . cephALEXin (KEFLEX) 500 MG capsule    Sig: Take 1 capsule (500 mg total) by mouth 2 (two) times daily for 7 days.    Dispense:  14 capsule    Refill:  0  . naproxen (NAPROSYN) 500 MG tablet    Sig: Take 1 tablet (500 mg total) by mouth 2 (two) times daily as needed for mild pain or moderate pain.    Dispense:  60 tablet    Refill:  0    Smitty Cords, MD [place], PGY-3

## 2017-12-11 NOTE — Assessment & Plan Note (Addendum)
Patient is tender in bilateral CVA region.  No red flag symptoms from back pain and she is afebrile.  Back pain could be from MSK muscle strain.  She also has some suprapubic pain and increased urinary frequency.  Feel that this is less likely UTI given her normal urinalysis.  However she is insistent that her symptoms do align with previous UTIs.  Given her symptoms we will treat for possible UTI with and obtain a urine culture.  More likely her history of bladder spasms and urge incontinence are contributing to her urinary issues.  Status post hysterectomy, no PID. - Keflex 500 mg twice daily x 7-day course -Urine culture obtained -Naproxen 500 mg twice daily as needed for pelvic pain -Discussed that if her symptoms do not start to improve over the next 3 days and she will need to follow-up in our clinic - She is continuing to take oxybutynin for urge incontinence, can consider referral to pelvic rehab and referral to urology

## 2017-12-11 NOTE — Patient Instructions (Signed)
Thank you for coming in today, it was so nice to see you! Today we talked about:    Your symptoms may be from a UTI and bladder spasms.  Your urinalysis test did not show any bacteria or signs of infection however given your symptoms we will go ahead and treat with antibiotics.  We will take a urine culture to see if any bacteria grows from your urine.  Take Keflex 500 mg twice daily for the next 7 days and you can take naproxen 500 mg twice daily as needed for pain  Please follow-up in 3 days if you do not notice any improvement at all  If we ordered any tests today, you will be notified via telephone of any abnormalities. If everything is normal you will get a letter in the mail.   If you have any questions or concerns, please do not hesitate to call the office at [phone]. You can also message me directly via MyChart.   Sincerely,  Smitty Cords, MD

## 2017-12-12 MED FILL — OXYBUTYNIN CL ER 10 MG TAB: 10 | 30 days supply | Qty: 30 | Fill #0

## 2017-12-13 LAB — URINE CULTURE

## 2017-12-13 MED FILL — SPIRIVA RESPIMAT INHAL SPRY: 2.5 | 30 days supply | Qty: 4 | Fill #0

## 2017-12-16 NOTE — Telephone Encounter (Signed)
Form completed, ready for MD signature and review.

## 2017-12-17 ENCOUNTER — Telehealth: Payer: Self-pay | Admitting: *Deleted

## 2017-12-17 NOTE — Telephone Encounter (Signed)
[employer] form signed by MD and sent to medical records for processing.

## 2017-12-17 NOTE — Telephone Encounter (Signed)
Pt [employer] form faxed to [phone]. 

## 2018-01-21 ENCOUNTER — Ambulatory Visit: Payer: 59 | Admitting: Neurology

## 2018-01-22 ENCOUNTER — Ambulatory Visit: Payer: 59 | Admitting: Student in an Organized Health Care Education/Training Program

## 2018-02-04 ENCOUNTER — Other Ambulatory Visit: Payer: Self-pay | Admitting: Family Medicine

## 2018-02-04 MED FILL — VENTOLIN HFA 90 MCG INHALER: 108 (90 BAS | 17 days supply | Qty: 18 | Fill #1

## 2018-02-04 MED FILL — CETIRIZINE HCL 10 MG TABS: 10 | 30 days supply | Qty: 30 | Fill #0

## 2018-02-04 MED FILL — ESCITALOPRAM 20 MG TABLET: 20 | 30 days supply | Qty: 30 | Fill #3

## 2018-02-04 MED FILL — ESOMEPRAZOLE MAG DR 40 MG C: 40 | 30 days supply | Qty: 30 | Fill #5

## 2018-02-04 MED FILL — SPIRIVA RESPIMAT INHAL SPRY: 2.5 | 30 days supply | Qty: 4 | Fill #1

## 2018-02-04 MED FILL — AJOVY 225 MG/1.5ML SOSY: 225 | 30 days supply | Qty: 2 | Fill #3

## 2018-02-05 MED FILL — LOSARTAN POTASSIUM 50 MG TA: 50 | 30 days supply | Qty: 30 | Fill #0

## 2018-02-05 MED FILL — OXYBUTYNIN CL ER 10 MG TAB: 10 | 30 days supply | Qty: 30 | Fill #0

## 2018-02-06 MED FILL — DULERA 200 MCG/5 MCG INH: 200-5 | 30 days supply | Qty: 13 | Fill #1

## 2018-02-07 ENCOUNTER — Ambulatory Visit (INDEPENDENT_AMBULATORY_CARE_PROVIDER_SITE_OTHER): Payer: 59 | Admitting: Internal Medicine

## 2018-02-07 ENCOUNTER — Encounter: Payer: Self-pay | Admitting: Internal Medicine

## 2018-02-07 ENCOUNTER — Other Ambulatory Visit: Payer: Self-pay

## 2018-02-07 VITALS — BP 148/92 | HR 66 | Temp 97.9°F | Wt 196.0 lb

## 2018-02-07 DIAGNOSIS — J019 Acute sinusitis, unspecified: Secondary | ICD-10-CM

## 2018-02-07 DIAGNOSIS — B9689 Other specified bacterial agents as the cause of diseases classified elsewhere: Secondary | ICD-10-CM | POA: Diagnosis not present

## 2018-02-07 DIAGNOSIS — R3915 Urgency of urination: Secondary | ICD-10-CM

## 2018-02-07 LAB — POCT URINALYSIS DIP (MANUAL ENTRY)
BILIRUBIN UA: NEGATIVE
Blood, UA: NEGATIVE
GLUCOSE UA: NEGATIVE mg/dL
Ketones, POC UA: NEGATIVE mg/dL
LEUKOCYTES UA: NEGATIVE
Nitrite, UA: NEGATIVE
Protein Ur, POC: NEGATIVE mg/dL
Spec Grav, UA: 1.01 (ref 1.010–1.025)
Urobilinogen, UA: 0.2 E.U./dL
pH, UA: 7 (ref 5.0–8.0)

## 2018-02-07 MED ORDER — AMOXICILLIN-POT CLAVULANATE 875-125 MG PO TABS: 1.0000 | ORAL_TABLET | Freq: Two times a day (BID) | ORAL | 0 refills | Status: DC

## 2018-02-07 MED FILL — AMOX-CLAV 875-125 MG TABLET: 875-125 | 10 days supply | Qty: 20 | Fill #0

## 2018-02-07 NOTE — Progress Notes (Signed)
   [facility] Phone: [phone]  Subjective:  Kelvin is a 52 year old female presenting to clinic with sinus congestion and urinary urgency.  Sinus congestion: Has been going on for the last two weeks. She has been having associated frontal headache that feels like a "pressure". She also endorses post nasal drip that causes her to cough a lot at night. She has been having some body aches and chills. No fevers. Taking Zyrtec, Flonase, and Singulair, which have not been helping.  Urinary Urgency: Has been going on for the last 2 weeks. Has had to wear a poise pad. Also endorses frequency. No dysuria. Has been having lower abdominal pain. No flank pain. Thinks she is having another UTI.  ROS: See HPI for pertinent positives and negatives  Past Medical History- migraines, HTN, asthma, chronic pelvic pain, depression, anxiety  Family history reviewed for today's visit. No changes.  Social history- patient is a former smoker  Objective: BP (!) 148/92   Pulse 66   Temp 97.9 F (36.6 C) (Oral)   Wt 196 lb (88.9 kg)   LMP 05/21/2017 (Within Weeks)   SpO2 99%   BMI 37.03 kg/m  Gen: NAD, alert, cooperative with exam HEENT: NCAT, EOMI, MMM, TMs clear, nasal turbinates edematous and erythematous with green nasal discharge present, frontal sinuses are tender to palpation, oropharynx erythematous. Neck: FROM, supple, no cervical lymphadenopathy Back: No CVA tenderness CV: RRR, no murmur Resp: CTABL, no wheezes, normal work of breathing GI: Soft, +suprapubic tenderness, non-distended, no rebound, no guarding  Assessment/Plan: Acute Bacterial Sinusitis: Has been having congestion, postnasal drip, rhinorrhea, and chills for the last 2 weeks. No relief with Zyrtec, Flonase, and Singulair. Will treat for acute bacterial sinusitis given duration of symptoms. - Augmentin bid x 10 days (patient has a penicillin allergy but has been able to tolerate Augmentin in the past  without any issues) - Return precautions discussed - Follow-up if no improvement  Urinary Urgency May be secondary to UTI, given her suprapubic pain and associated frequency. UA completely negative, but patient states she often has normal UAs and positive cultures. Has had two recent urine cultures that grew pan-sensitive E. Coli. May also just be a worsening of her underlying urge incontinence (patient on oxybutynin at baseline) - Already treating for acute bacterial sinusitis with Augmentin (last urine cultures with E. Coli sensitive to Ampicillin) - Urine culture ordered  Hyman Bible, MD PGY-3

## 2018-02-07 NOTE — Patient Instructions (Signed)
It was so nice to meet you!  I have prescribed Augmentin for you, which will cover both your sinus infection and a possible UTI. Please take 1 tablet twice a day for 10 days.  -Dr. Brett Albino

## 2018-02-07 NOTE — Assessment & Plan Note (Signed)
Has been having congestion, postnasal drip, rhinorrhea, and chills for the last 2 weeks. No relief with Zyrtec, Flonase, and Singulair. Will treat for acute bacterial sinusitis given duration of symptoms. - Augmentin bid x 10 days (patient has a penicillin allergy but has been able to tolerate Augmentin in the past without any issues) - Return precautions discussed - Follow-up if no improvement

## 2018-02-07 NOTE — Assessment & Plan Note (Signed)
May be secondary to UTI, given her suprapubic pain and associated frequency. UA completely negative, but patient states she often has normal UAs and positive cultures. Has had two recent urine cultures that grew pan-sensitive E. Coli. May also just be a worsening of her underlying urge incontinence (patient on oxybutynin at baseline) - Already treating for acute bacterial sinusitis with Augmentin (last urine cultures with E. Coli sensitive to Ampicillin) - Urine culture ordered

## 2018-02-10 LAB — URINE CULTURE

## 2018-02-25 ENCOUNTER — Ambulatory Visit: Payer: 59 | Admitting: Internal Medicine

## 2018-02-25 ENCOUNTER — Encounter: Payer: Self-pay | Admitting: Internal Medicine

## 2018-02-25 VITALS — BP 128/88 | HR 87 | Resp 16 | Ht 61.0 in | Wt 203.0 lb

## 2018-02-25 DIAGNOSIS — G4720 Circadian rhythm sleep disorder, unspecified type: Secondary | ICD-10-CM

## 2018-02-25 DIAGNOSIS — J45909 Unspecified asthma, uncomplicated: Secondary | ICD-10-CM | POA: Diagnosis not present

## 2018-02-25 DIAGNOSIS — G4733 Obstructive sleep apnea (adult) (pediatric): Secondary | ICD-10-CM

## 2018-02-25 NOTE — Patient Instructions (Signed)
Continue to try to use your cpap every night.  Continue your current inhalers and allergy medicines.

## 2018-02-25 NOTE — Progress Notes (Signed)
* [facility]     Assessment and Plan:  Obstructive sleep apnea. -She had stopped wearing CPAP completely for the last few months.  She is starting to feel more sleepy again. - Poor compliance with CPAP with continued daytime sleepiness.    Circadian rhythm/shift work sleep disorder. -Patient works an overnight shift, this may be contributing to her daytime sleepiness. -She may need medication to help her stay awake but her OSA has to be maximally treated before doing this.   Excessive daytime sleepiness. -Multifactorial from sedating medication, OSA, shift work disorder.   Asthma --Currently worsened due to allergies.  --Continue dulera, spiriva, singulair, zyrtec, nebs, albuterol mdi.   Date: 02/25/2018  MRN# [id number] [name] [birth year]   [name] is a 52 y.o. old female seen in follow up for chief complaint of  Chief Complaint  Patient presents with  . Sleep Apnea    pt here for compliance cpap. She is still having a hard with cpap.     HPI:   The patient is a 52 year old female. She underwent a sleep study on 12/02/15, which showed an AHI of 6, consistent with mild obstructive sleep apnea. Her settings are 5-20; residual AHI remains 0.7. 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 She goes to work by 3 AM. Today she continues to have spotty use of her CPAP. She appears tired, she has complaints of swelling in her body and legs. She speaks without opening her eyes and says that she does not feel well.  She feels that the breathing is worse because of her swelling and the allergies. She takes spiriva twice per day, proair bid, dulera 2 puff bid. She also uses nebs as needed.  She takes singulair and zyrtec.    She has a history of not getting inadequate sleep, on average for 4 hours 20 min. She is sleepy by 7, goes to bed around 8 pm and puts on her nasal mask, then falls asleep within about 15 min. She does not wake at night. She  wakes at 2 am to her alarm to be at work by [employer], she works at [employer] at [facility].   **Download data personally reviewed, 3 days as of 08/20/2016.  Usage greater than 4 hours is 13/30 days.  Average usage on days used is 4 hours 20 minutes.  Pressure ranges 5-20.  Median pressure 7, 95th percentile pressure is 10, maximum pressure is 11.  Residual AHI is 0.7.  While this test shows suboptimal use of CPAP with excellent control of obstructive sleep apnea when used. **Download data 9/24-10/23/17:23/30 days; greater than 4 hours equals 13 days. 43%. Average usage is 4 hours 20 minutes. Onset 5-20; 95th percentile pressure is 10, residual AHI 0.7.  She takes nexium once in am. She used to get severe "strangling" if she eats close to bed time which improved when she stopped eating close to bedtime.   They have a dog but not in her bedroom.    Medication:   Outpatient Encounter Medications as of 02/25/2018  Medication Sig  . AJOVY 225 MG/1.5ML SOSY INJECT EVERY 30 DAY INTO THE SKIN  . albuterol (PROVENTIL) (2.5 MG/3ML) 0.083% nebulizer solution Take 3 mLs (2.5 mg total) every 4 (four) hours as needed by nebulization.  Marland Kitchen albuterol (VENTOLIN HFA) 108 (90 Base) MCG/ACT inhaler INHALE 2 PUFFS BY MOUTH EVERY 3-4 HOURS AS NEEDED FOR SHORTNESS OF BREATH\WHEEZING\RECURRENT COUGH  . baclofen (LIORESAL) 10 MG tablet Take  1 tablet (10 mg total) by mouth 3 (three) times daily as needed (headaches).  Marland Kitchen BIOTIN PO Take 2,000 mg by mouth daily.  . cetirizine (ZYRTEC) 10 MG tablet Take 1 tablet (10 mg total) daily by mouth.  . cyclobenzaprine (FLEXERIL) 10 MG tablet Take 0.5-1 tablets (5-10 mg total) by mouth every 8 (eight) hours as needed for muscle spasms.  . diclofenac (VOLTAREN) 75 MG EC tablet Take 1 tablet (75 mg total) by mouth 2 (two) times daily with a meal. (Patient not taking: Reported on 10/23/2017)  . eletriptan (RELPAX) 40 MG tablet Take 1 tablet (40 mg total) by mouth as needed for migraine or headache. May  repeat in 2 hours if headache persists or recurs.  Marland Kitchen escitalopram (LEXAPRO) 20 MG tablet Take 1 tablet (20 mg total) daily by mouth.  . esomeprazole (NEXIUM) 40 MG capsule TAKE 1 CAPSULE BY MOUTH ONCE DAILY  . estrogen-methylTESTOSTERone 0.625-1.25 MG tablet Take 1 tablet daily by mouth.  . fluticasone (FLONASE) 50 MCG/ACT nasal spray PLACE 2 SPRAYS INTO THE NOSE DAILY.  . furosemide (LASIX) 20 MG tablet TAKE 1 TABLET BY MOUTH DAILY.  Marland Kitchen gabapentin (NEURONTIN) 300 MG capsule Take 1 capsule (300 mg total) by mouth every 8 (eight) hours as needed (PAIN). Can take 600 mg at night prn hot flashes  . losartan (COZAAR) 50 MG tablet Take 1 tablet (50 mg total) daily by mouth.  . losartan (COZAAR) 50 MG tablet TAKE 1 TABLET BY MOUTH ONCE DAILY  . meloxicam (MOBIC) 15 MG tablet Take 1 tablet (15 mg total) by mouth daily.  . methocarbamol (ROBAXIN) 500 MG tablet Take 1 tablet (500 mg total) by mouth 2 (two) times daily.  . mometasone-formoterol (DULERA) 200-5 MCG/ACT AERO Inhale 2 puffs into the lungs 2 (two) times daily.  . montelukast (SINGULAIR) 10 MG tablet Take 1 tablet (10 mg total) by mouth at bedtime.  . Multiple Vitamin (MULTIVITAMIN WITH MINERALS) TABS tablet Take 1 tablet by mouth daily.  . naproxen (NAPROSYN) 500 MG tablet Take 1 tablet (500 mg total) by mouth 2 (two) times daily as needed for mild pain or moderate pain.  . NONFORMULARY OR COMPOUNDED ITEM Inject 1 application every 30 (thirty) days into the skin.  Marland Kitchen ondansetron (ZOFRAN) 4 MG tablet Take 1 tablet (4 mg total) by mouth every 8 (eight) hours as needed for nausea or vomiting.  Marland Kitchen oxybutynin (DITROPAN-XL) 10 MG 24 hr tablet TAKE 1 TABLET BY MOUTH AT BEDTIME  . propranolol ER (INDERAL LA) 80 MG 24 hr capsule Take 1 capsule (80 mg total) by mouth daily.  . Pyridoxine HCl (VITAMIN B-6 PO) Take 1 tablet by mouth daily. Unknown OTC strength  . simvastatin (ZOCOR) 40 MG tablet TAKE 1 TABLET BY MOUTH AT BEDTIME.  Marland Kitchen Spacer/Aero-Holding  Chambers (AEROCHAMBER MV) inhaler Use as instructed  . SPIRIVA RESPIMAT 2.5 MCG/ACT AERS INHALE 2 PUFFS BY MOUTH DAILY.  . SUMAtriptan (IMITREX) 50 MG tablet Take 50 mg by mouth every 2 (two) hours as needed for migraine. May repeat in 2 hours if headache persists or recurs.  . traMADol (ULTRAM) 50 MG tablet Take 2 tablets (100 mg total) by mouth every 6 (six) hours as needed. (Patient taking differently: Take 100 mg by mouth every 6 (six) hours as needed for moderate pain. )  . vitamin C (ASCORBIC ACID) 500 MG tablet Take 500 mg by mouth daily.   . [DISCONTINUED] amoxicillin-clavulanate (AUGMENTIN) 875-125 MG tablet Take 1 tablet by mouth 2 (two) times daily.  . [  DISCONTINUED] medroxyPROGESTERone (DEPO-PROVERA) 150 MG/ML injection Inject 1 mL (150 mg total) into the muscle every 3 (three) months.  . [DISCONTINUED] medroxyPROGESTERone (PROVERA) 10 MG tablet Take 2 tablets (20 mg total) by mouth daily.   No facility-administered encounter medications on file as of 02/25/2018.      Allergies:  Amitriptyline; Fluticasone-salmeterol; Penicillins; Ciprofloxacin; Macrobid [nitrofurantoin monohyd macro]; and Sulfamethoxazole-trimethoprim  Review of Systems: Gen:  Denies  fever, sweats. HEENT: Denies blurred vision. Cvc:  No dizziness, chest pain or heaviness Resp:   Denies cough or sputum porduction. Endoc:  No polyuria, polydipsia. Psych: No depression, insomnia. Other:  All other systems were reviewed and found to be negative other than what is mentioned in the HPI.   Physical Examination:   VS: BP 128/88 (BP Location: Left Arm, Cuff Size: Large)   Pulse 87   Resp 16   Ht 5\' 1"  (1.549 m)   Wt 203 lb (92.1 kg)   LMP 05/21/2017 (Within Weeks)   SpO2 100%   BMI 38.36 kg/m   General Appearance: No distress  Neuro:without focal findings,  speech normal,  HEENT: PERRLA, EOM intact. Pulmonary: normal breath sounds, No wheezing.   CardiovascularNormal S1,S2.  No m/r/g.   Abdomen: Benign,  Soft, non-tender. Renal:  No costovertebral tenderness  GU:  Not performed at this time. Endoc: No evident thyromegaly, no signs of acromegaly. Skin:   warm, no rash. Extremities: normal, no cyanosis, clubbing.   LABORATORY PANEL:   CBC No results for input(s): WBC, HGB, HCT, PLT in the last 168 hours. ------------------------------------------------------------------------------------------------------------------  Chemistries  No results for input(s): NA, K, CL, CO2, GLUCOSE, BUN, CREATININE, CALCIUM, MG, AST, ALT, ALKPHOS, BILITOT in the last 168 hours.  Invalid input(s): GFRCGP ------------------------------------------------------------------------------------------------------------------  Cardiac Enzymes No results for input(s): TROPONINI in the last 168 hours. ------------------------------------------------------------  RADIOLOGY:   No results found for this or any previous visit. Results for orders placed during the hospital encounter of 11/28/15  DG Chest 2 View   Narrative CLINICAL DATA:  Right-sided chest pain for 3 days no fever  EXAM: CHEST  2 VIEW  COMPARISON:  08/17/2015  FINDINGS: Cardiac silhouette upper normal in size but stable. Vascular pattern normal. Lungs clear. No effusions.  IMPRESSION: No active cardiopulmonary disease.   Electronically Signed   By: Skipper Cliche M.D.   On: 11/28/2015 12:37    ------------------------------------------------------------------------------------------------------------------  Thank  you for allowing [facility] to assist in the care of your patient. Our recommendations are noted above.  Please contact us if we can be of further service.   Marda Stalker, MD.  [facility] Office Number: [phone]  Patricia Pesa, M.D.  Merton Border, M.D  02/25/2018

## 2018-02-27 ENCOUNTER — Encounter: Payer: Self-pay | Admitting: Internal Medicine

## 2018-02-27 ENCOUNTER — Ambulatory Visit (INDEPENDENT_AMBULATORY_CARE_PROVIDER_SITE_OTHER): Payer: 59 | Admitting: Internal Medicine

## 2018-02-27 ENCOUNTER — Other Ambulatory Visit: Payer: Self-pay

## 2018-02-27 VITALS — BP 120/80 | HR 67 | Temp 98.2°F | Ht 61.0 in | Wt 197.0 lb

## 2018-02-27 DIAGNOSIS — E8770 Fluid overload, unspecified: Secondary | ICD-10-CM | POA: Diagnosis not present

## 2018-02-27 DIAGNOSIS — I5032 Chronic diastolic (congestive) heart failure: Secondary | ICD-10-CM | POA: Diagnosis not present

## 2018-02-27 DIAGNOSIS — R600 Localized edema: Secondary | ICD-10-CM

## 2018-02-27 MED ORDER — FUROSEMIDE 20 MG PO TABS: 20.0000 mg | ORAL_TABLET | Freq: Every day | ORAL | 3 refills | Status: DC

## 2018-02-27 MED FILL — FUROSEMIDE 20 MG TABS: 20 | 30 days supply | Qty: 30 | Fill #0

## 2018-02-27 NOTE — Patient Instructions (Signed)
Ms. Teaney,  I will call with lab results.  I refilled your lasix. Continue to take this as needed for weight gain of more than 2 pounds in a day or more than 5 pounds in a week.  Best, Dr. Ola Spurr

## 2018-02-27 NOTE — Progress Notes (Signed)
[facility] Progress Note  Subjective:  [name] is a 52 y.o. female with history of migraines, asthma, HFpEF (65% -70%, G1DD), AUB s/p recent hysterectomy, and former tobacco abuse who presents for lower extremity edema. She reports her clothes felt tight Sunday and that she could leave an imprint in her thigh with pressing. She said her weight at work was 208 lbs on Tuesday, which is about 10 lbs higher than her normal. She took lasix Tuesday night with good urine output. She saw pulmonology this week and was encouraged to use her CPAP regularly--she had not been using it recently due to sinus issues. She reports being confused to hear she has heart failure because she was cleared by [facility] prior to her hysterectomy. However, she is taking losartan and propranolol (migraines). She reports increased SOB with lying flat that has improved since taking lasix. She does work long shifts as an Therapist, sports at [employer]. She denies changes in diet and says she already limits salt intake. ROS: No chest pain, no palpitations  Allergies  Allergen Reactions  . Amitriptyline     Abnormal behavior. Just doesn't feel like normal self   . Fluticasone-Salmeterol Other (See Comments)    Thrush even with mouth rinsing; worsens cough  . Penicillins Itching and Swelling    Has patient had a PCN reaction causing immediate rash, facial/tongue/throat swelling, SOB or lightheadedness with hypotension: No Has patient had a PCN reaction causing severe rash involving mucus membranes or skin necrosis: No Has patient had a PCN reaction that required hospitalization: No Has patient had a PCN reaction occurring within the last 10 years: Yes If all of the above answers are "NO", then may proceed with Cephalosporin use.   . Ciprofloxacin Itching and Rash  . Macrobid [Nitrofurantoin Monohyd Macro] Itching  . Sulfamethoxazole-Trimethoprim Rash    Social History   Tobacco Use  . Smoking status: Former  Smoker    Packs/day: 0.25    Years: 15.00    Pack years: 3.75    Types: Cigarettes    Last attempt to quit: 02/16/2003    Years since quitting: 15.0  . Smokeless tobacco: Never Used  Substance Use Topics  . Alcohol use: Yes    Comment: socially    Objective: Blood pressure 120/80, pulse 67, temperature 98.2 F (36.8 C), temperature source Oral, height 5\' 1"  (1.549 m), weight 197 lb (89.4 kg), last menstrual period 05/21/2017, SpO2 97 %. Body mass index is 37.22 kg/m. Constitutional: Obese female, sitting up in chair in NAD, wearing sunglasses Cardiovascular: RRR, S1, S2, no m/r/g.  Pulmonary/Chest: Effort normal and breath sounds normal.  Musculoskeletal: No LE edema Psychiatric: Normal mood and affect.  Vitals reviewed  Assessment/Plan: Lower extremity edema - Resolved after taking prn lasix, no crackles on lung exam, and weight back to baseline. Suspect mild CHF exacerbation given degree of reported edema, though unknown trigger. - Ordered CBC and TSH to look for reversible causes (anemia, hypothyroidism); and BMP given recent lasix use and BNP to confirm suspected CHF exacerbation.  - Reordered lasix 20 mg for patient to take prn for weight gain of 2lbs or more in a day or 5lbs or more in a week.  - If continues to occur, would recommend repeat ECHO, as it has been about 3 years since last performed. Should ensure patient has adequate compression hose given work that requires standing for extended periods of time.   Follow-up prn.  Olene Floss, MD [facility],  PGY-3

## 2018-02-28 LAB — BASIC METABOLIC PANEL
BUN / CREAT RATIO: 13 (ref 9–23)
BUN: 11 mg/dL (ref 6–24)
CHLORIDE: 100 mmol/L (ref 96–106)
CO2: 26 mmol/L (ref 20–29)
Calcium: 9.5 mg/dL (ref 8.7–10.2)
Creatinine, Ser: 0.83 mg/dL (ref 0.57–1.00)
GFR calc Af Amer: 94 mL/min/{1.73_m2} (ref >59)
GFR calc non Af Amer: 82 mL/min/{1.73_m2} (ref >59)
GLUCOSE: 93 mg/dL (ref 65–99)
POTASSIUM: 4 mmol/L (ref 3.5–5.2)
SODIUM: 142 mmol/L (ref 134–144)

## 2018-02-28 LAB — CBC
Hematocrit: 39.5 % (ref 34.0–46.6)
Hemoglobin: 12.9 g/dL (ref 11.1–15.9)
MCH: 30.5 pg (ref 26.6–33.0)
MCHC: 32.7 g/dL (ref 31.5–35.7)
MCV: 93 fL (ref 79–97)
PLATELETS: 304 10*3/uL (ref 150–379)
RBC: 4.23 x10E6/uL (ref 3.77–5.28)
RDW: 13.4 % (ref 12.3–15.4)
WBC: 4.7 10*3/uL (ref 3.4–10.8)

## 2018-02-28 LAB — TSH: TSH: 1.93 u[IU]/mL (ref 0.450–4.500)

## 2018-02-28 LAB — BRAIN NATRIURETIC PEPTIDE: BNP: 5.2 pg/mL (ref 0.0–100.0)

## 2018-03-02 ENCOUNTER — Encounter: Payer: Self-pay | Admitting: Internal Medicine

## 2018-03-02 NOTE — Assessment & Plan Note (Signed)
-   Resolved after taking prn lasix, no crackles on lung exam, and weight back to baseline. Suspect mild CHF exacerbation given degree of reported edema, though unknown trigger. - Ordered CBC and TSH to look for reversible causes (anemia, hypothyroidism); and BMP given recent lasix use and BNP to confirm suspected CHF exacerbation.  - Reordered lasix 20 mg for patient to take prn for weight gain of 2lbs or more in a day or 5lbs or more in a week.  - If continues to occur, would recommend repeat ECHO, as it has been about 3 years since last performed. Should ensure patient has adequate compression hose given work that requires standing for extended periods of time.

## 2018-03-04 ENCOUNTER — Other Ambulatory Visit: Payer: Self-pay | Admitting: Family Medicine

## 2018-03-04 MED FILL — AJOVY 225 MG/1.5ML SOSY: 225 | 30 days supply | Qty: 2 | Fill #4

## 2018-03-04 MED FILL — SPIRIVA RESPIMAT INHAL SPRY: 2.5 | 30 days supply | Qty: 4 | Fill #2

## 2018-03-04 MED FILL — VENTOLIN HFA 90 MCG INHALER: 108 (90 BAS | 17 days supply | Qty: 18 | Fill #2

## 2018-03-04 MED FILL — LOSARTAN POTASSIUM 50 MG TA: 50 | 30 days supply | Qty: 30 | Fill #1

## 2018-03-04 MED FILL — DULERA 200 MCG/5 MCG INH: 200-5 | 30 days supply | Qty: 13 | Fill #2

## 2018-03-04 MED FILL — EEMT HS 0.625-1.25 MG TAB: 0.625-1.25 | 90 days supply | Qty: 90 | Fill #1

## 2018-03-06 MED FILL — ESOMEPRAZOLE MAG DR 40 MG C: 40 | 30 days supply | Qty: 30 | Fill #0

## 2018-03-10 NOTE — Progress Notes (Signed)
Subjective:  CC -- Annual Physical; With complaints of vaginal irritation  Pt reports she believes she has a yeast infection. She has had yeast infections in the past. Reports no significant vaginal discharge but states she has a fishy odor at times. States that it comes and goes. She does not excessive dryness ever since having a hysterectomy. States no change in soaps or detergents. Patient is sexually active with 1 female partner x10 years but was recently seperated for 8 months. At that time patient is unsure if partner had other sexual partners. Patient had recent intercourse where condom broke. Patient states some pain during intercourse. Patient normally has OB/GYN care with Dr. Harolyn Rutherford. Patient is on estrogen oral therapy and would like a cream for increased dryness.   Cardiovascular: - Risk as of 5/17 (usuing lipid panel from 03/14/16): 6.4%; plan for updating lipid panel at today's visit  - Dx Hypertension: yes  - Dx Hyperlipidemia: yes  - Dx Obesity: yes, class II  - Physical Activity: yes, stands/pulls/stretches and walks a lot at work  - Diabetes: no   Cancer: Colorectal >> Colonoscopy: no, will be sent with referral today  Lung >> Tobacco Use: no  Breast >> Mammogram: normal mammogram on 10/2017   Cervical/Endometrial >>  - Postmenopausal: yes  - Vaginal Bleeding: no - Pap Smear: Normal pap on 04/2017   Skin >> Suspicious lesions: no   Social: Alcohol Use: yes, ocassionally   Tobacco Use: no  Other Drugs: no  Risky Sexual Behavior: no  Depression: no, knows of resources available and sees [facility]rapist when she needs it   - PHQ2 score: 1 Support and Life at Home: yes   Other: Osteoporosis: no   Zoster Vaccine: no  Flu Vaccine: yes, 07/28/17  Pneumonia Vaccine: no     Past Medical History Patient Active Problem List   Diagnosis Date Noted  . Vaginal irritation 03/14/2018  . Restless leg syndrome 09/05/2017  . Chronic female pelvic pain 08/15/2017  . S/P  abdominal supracervical hysterectomy, bilateral salpingectomy, left oophorectomy 08/15/2017  . Chronic migraine without aura with status migrainosus, not intractable 07/26/2017  . Sinusitis 02/25/2017  . Acute bacterial sinusitis 11/22/2016  . Left knee pain 08/29/2016  . Bunion of great toe of right foot 08/29/2016  . Healthcare maintenance 08/01/2016  . Abdominal cramping 12/30/2015  . Bladder spasms 12/30/2015  . Pre-diabetes 10/03/2015  . HTN (hypertension) 10/03/2015  . Fatigue 07/21/2015  . Urinary urgency 07/06/2015  . Flank pain 07/06/2015  . Hot flashes 10/14/2014  . Vitamin D deficiency 11/25/2013  . Cough 09/10/2013  . Lower extremity edema 07/20/2013  . Back pain 06/30/2013  . Migraine 06/07/2013  . Knee pain, bilateral 10/09/2012  . Obstructive sleep apnea 09/08/2010  . HLD (hyperlipidemia) 09/20/2008  . Depression with anxiety 12/26/2006  . RHINITIS, ALLERGIC 12/26/2006  . Asthma 12/26/2006    Medications- reviewed and updated Current Outpatient Medications  Medication Sig Dispense Refill  . AJOVY 225 MG/1.5ML SOSY INJECT EVERY 30 DAY INTO THE SKIN  11  . albuterol (PROVENTIL) (2.5 MG/3ML) 0.083% nebulizer solution Take 3 mLs (2.5 mg total) every 4 (four) hours as needed by nebulization. 75 mL 6  . albuterol (VENTOLIN HFA) 108 (90 Base) MCG/ACT inhaler INHALE 2 PUFFS BY MOUTH EVERY 3-4 HOURS AS NEEDED FOR SHORTNESS OF BREATH\WHEEZING\RECURRENT COUGH 18 g 6  . baclofen (LIORESAL) 10 MG tablet Take 1 tablet (10 mg total) by mouth 3 (three) times daily as needed (headaches). 60 each 1  .  BIOTIN PO Take 2,000 mg by mouth daily.    . cetirizine (ZYRTEC) 10 MG tablet Take 1 tablet (10 mg total) daily by mouth. 30 tablet 11  . diclofenac (VOLTAREN) 75 MG EC tablet Take 1 tablet (75 mg total) by mouth 2 (two) times daily with a meal. 60 tablet 0  . eletriptan (RELPAX) 40 MG tablet Take 1 tablet (40 mg total) by mouth as needed for migraine or headache. May repeat in 2  hours if headache persists or recurs. 10 tablet 11  . escitalopram (LEXAPRO) 20 MG tablet Take 1 tablet (20 mg total) daily by mouth. 30 tablet 3  . esomeprazole (NEXIUM) 40 MG capsule TAKE 1 CAPSULE BY MOUTH ONCE DAILY 30 capsule 6  . estrogen-methylTESTOSTERone 0.625-1.25 MG tablet Take 1 tablet daily by mouth. 90 tablet 5  . fluconazole (DIFLUCAN) 150 MG tablet Take 1 tablet (150 mg total) by mouth once for 1 dose. 1 tablet 0  . fluticasone (FLONASE) 50 MCG/ACT nasal spray PLACE 2 SPRAYS INTO THE NOSE DAILY. 16 g 6  . furosemide (LASIX) 20 MG tablet Take 1 tablet (20 mg total) by mouth daily. 30 tablet 3  . gabapentin (NEURONTIN) 300 MG capsule Take 1 capsule (300 mg total) by mouth every 8 (eight) hours as needed (PAIN). Can take 600 mg at night prn hot flashes 90 capsule 3  . losartan (COZAAR) 50 MG tablet TAKE 1 TABLET BY MOUTH ONCE DAILY 30 tablet 1  . meloxicam (MOBIC) 15 MG tablet Take 1 tablet (15 mg total) by mouth daily. 10 tablet 0  . methocarbamol (ROBAXIN) 500 MG tablet Take 1 tablet (500 mg total) by mouth 2 (two) times daily. 20 tablet 0  . mometasone-formoterol (DULERA) 200-5 MCG/ACT AERO Inhale 2 puffs into the lungs 2 (two) times daily. 8.8 g 5  . montelukast (SINGULAIR) 10 MG tablet Take 1 tablet (10 mg total) by mouth at bedtime. 90 tablet 3  . Multiple Vitamin (MULTIVITAMIN WITH MINERALS) TABS tablet Take 1 tablet by mouth daily.    . naproxen (NAPROSYN) 500 MG tablet Take 1 tablet (500 mg total) by mouth 2 (two) times daily as needed for mild pain or moderate pain. 60 tablet 0  . NONFORMULARY OR COMPOUNDED ITEM Inject 1 application every 30 (thirty) days into the skin. 1 each 11  . ondansetron (ZOFRAN) 4 MG tablet Take 1 tablet (4 mg total) by mouth every 8 (eight) hours as needed for nausea or vomiting. 6 tablet 0  . oxybutynin (DITROPAN-XL) 10 MG 24 hr tablet TAKE 1 TABLET BY MOUTH AT BEDTIME 30 tablet 0  . Pyridoxine HCl (VITAMIN B-6 PO) Take 1 tablet by mouth daily.  Unknown OTC strength    . simvastatin (ZOCOR) 40 MG tablet TAKE 1 TABLET BY MOUTH AT BEDTIME. 90 tablet 3  . Spacer/Aero-Holding Chambers (AEROCHAMBER MV) inhaler Use as instructed 1 each 0  . SPIRIVA RESPIMAT 2.5 MCG/ACT AERS INHALE 2 PUFFS BY MOUTH DAILY. 4 g 3  . SUMAtriptan (IMITREX) 50 MG tablet Take 50 mg by mouth every 2 (two) hours as needed for migraine. May repeat in 2 hours if headache persists or recurs.    . traMADol (ULTRAM) 50 MG tablet Take 2 tablets (100 mg total) by mouth every 6 (six) hours as needed. (Patient taking differently: Take 100 mg by mouth every 6 (six) hours as needed for moderate pain. ) 30 tablet 0  . vitamin C (ASCORBIC ACID) 500 MG tablet Take 500 mg by mouth daily.  No current facility-administered medications for this visit.     Objective: BP (!) 144/88   Pulse 65   Temp 98.3 F (36.8 C) (Oral)   Ht 5\' 1"  (1.549 m)   Wt 197 lb (89.4 kg)   LMP 05/21/2017 (Within Weeks)   SpO2 97%   BMI 37.22 kg/m  Gen: NAD, alert, cooperative with exam HEENT: NCAT, EOMI, PERRL, typanic membranes visualized bilaterally and clear  CV: RRR, good S1/S2, no murmur Resp: CTABL, no wheezes, non-labored Abd: Soft, Non Tender, Non Distended, BS present, no guarding or organomegaly Genital Exam: normal external genitalia, vulva, vagina, cervix, uterus and adnexa Ext: No edema, warm, 5/5 muscle strength in all extremities  Neuro: Alert and oriented, No gross deficits   Assessment/Plan:  Pre-diabetes Obtained A1C today  HLD (hyperlipidemia) Obtained lipid panel today   Vaginal irritation Likely 2/2 increased dryness 2/2 menapause after hysterectomy. Patient without increased discharge, however there is odor. Patient states she has had yeast infections in the past so understands how they feel. Patient requesting diflucan today as she did not want to wait for wet prep results. Have prescribed 1 dose diflucan to be taken for possible yeast infection. Have draw RPR and  HIV, and collected wet prep, GC/Chlamyidea, and urine studies to rule out other causes of possible vaginal irritation. Patient requesting full STD work up as she was separated for 8 months from partner. Patient requesting all STD results be discussed over the phone. Will plan to call with results. If wet prep or GC/chlamydia are positive will call in RX to pharmacy [facility]).   Given that patient is being managed by OB/GYN for oral estrogen therapy will defer any other estrogen creams to OB. Patient aware and in agreement with plan.   Discussed patient with Dr. Gwendlyn Deutscher  Healthcare maintenance Referral for colonoscopy placed and patient given handout on how to call and schedule appointment   Orders Placed This Encounter  Procedures  . HIV antibody (with reflex)  . RPR  . Lipid Panel  . Ambulatory referral to Gastroenterology    Referral Priority:   Routine    Referral Type:   Consultation    Referral Reason:   Specialty Services Required    Number of Visits Requested:   1  . POCT Wet Prep [employer])  . POCT A1C  . POCT urinalysis dipstick    Meds ordered this encounter  Medications  . fluconazole (DIFLUCAN) 150 MG tablet    Sig: Take 1 tablet (150 mg total) by mouth once for 1 dose.    Dispense:  1 tablet    Refill:  0     Caroline More, DO, PGY-1 03/14/2018 4:25 PM

## 2018-03-14 ENCOUNTER — Other Ambulatory Visit: Payer: Self-pay

## 2018-03-14 ENCOUNTER — Ambulatory Visit (INDEPENDENT_AMBULATORY_CARE_PROVIDER_SITE_OTHER): Payer: 59 | Admitting: Family Medicine

## 2018-03-14 ENCOUNTER — Other Ambulatory Visit (HOSPITAL_COMMUNITY)
Admission: RE | Admit: 2018-03-14 | Discharge: 2018-03-14 | Disposition: A | Discharge status: Home / Self Care | Payer: 59 | Source: Ambulatory Visit | Attending: Family Medicine | Admitting: Family Medicine

## 2018-03-14 ENCOUNTER — Encounter: Payer: Self-pay | Admitting: Family Medicine

## 2018-03-14 VITALS — BP 144/88 | HR 65 | Temp 98.3°F | Ht 61.0 in | Wt 197.0 lb

## 2018-03-14 DIAGNOSIS — Z1211 Encounter for screening for malignant neoplasm of colon: Secondary | ICD-10-CM | POA: Diagnosis not present

## 2018-03-14 DIAGNOSIS — N949 Unspecified condition associated with female genital organs and menstrual cycle: Secondary | ICD-10-CM

## 2018-03-14 DIAGNOSIS — Z113 Encounter for screening for infections with a predominantly sexual mode of transmission: Secondary | ICD-10-CM | POA: Insufficient documentation

## 2018-03-14 DIAGNOSIS — R7303 Prediabetes: Secondary | ICD-10-CM | POA: Diagnosis not present

## 2018-03-14 DIAGNOSIS — E7800 Pure hypercholesterolemia, unspecified: Secondary | ICD-10-CM

## 2018-03-14 DIAGNOSIS — N898 Other specified noninflammatory disorders of vagina: Secondary | ICD-10-CM | POA: Diagnosis not present

## 2018-03-14 DIAGNOSIS — Z0000 Encounter for general adult medical examination without abnormal findings: Secondary | ICD-10-CM

## 2018-03-14 DIAGNOSIS — E785 Hyperlipidemia, unspecified: Secondary | ICD-10-CM

## 2018-03-14 LAB — POCT WET PREP (WET MOUNT)
Clue Cells Wet Prep Whiff POC: NEGATIVE
TRICHOMONAS WET PREP HPF POC: ABSENT

## 2018-03-14 LAB — POCT URINALYSIS DIP (MANUAL ENTRY)
BILIRUBIN UA: NEGATIVE
GLUCOSE UA: NEGATIVE mg/dL
Ketones, POC UA: NEGATIVE mg/dL
Leukocytes, UA: NEGATIVE
Nitrite, UA: NEGATIVE
Protein Ur, POC: NEGATIVE mg/dL
RBC UA: NEGATIVE
SPEC GRAV UA: 1.02 (ref 1.010–1.025)
UROBILINOGEN UA: 0.2 U/dL
pH, UA: 6.5 (ref 5.0–8.0)

## 2018-03-14 LAB — POCT GLYCOSYLATED HEMOGLOBIN (HGB A1C): HEMOGLOBIN A1C: 5.8

## 2018-03-14 MED ORDER — FLUCONAZOLE 150 MG PO TABS: 150.0000 mg | ORAL_TABLET | Freq: Once | ORAL | 0 refills | Status: AC

## 2018-03-14 MED FILL — FLUCONAZOLE 150 MG TABS: 150 | 1 days supply | Qty: 1 | Fill #0

## 2018-03-14 NOTE — Patient Instructions (Signed)
Atrophic Vaginitis Atrophic vaginitis is when the tissues that line the vagina become dry and thin. This is caused by a drop in estrogen. Estrogen helps:  To keep the vagina moist.  To make a clear fluid that helps: ? To lubricate the vagina for sex. ? To protect the vagina from infection.  If the lining of the vagina is dry and thin, it may:  Make sex painful. It may also cause bleeding.  Cause a feeling of: ? Burning. ? Irritation. ? Itchiness.  Make an exam of your vagina painful. It may also cause bleeding.  Make you lose interest in sex.  Cause a burning feeling when you pee.  Make your vaginal fluid (discharge) brown or yellow.  For some women, there are no symptoms. This condition is most common in women who do not get their regular menstrual periods anymore (menopause). This often starts when a woman is 38-38 years old. Follow these instructions at home:  Take medicines only as told by your doctor. Do not use any herbal or alternative medicines unless your doctor says it is okay.  Use over-the-counter products for dryness only as told by your doctor. These include: ? Creams. ? Lubricants. ? Moisturizers.  Do not douche.  Do not use products that can make your vagina dry. These include: ? Scented feminine sprays. ? Scented tampons. ? Scented soaps.  If it hurts to have sex, tell your sexual partner. Contact a doctor if:  Your discharge looks different than normal.  Your vagina has an unusual smell.  You have new symptoms.  Your symptoms do not get better with treatment.  Your symptoms get worse. This information is not intended to replace advice given to you by your health care provider. Make sure you discuss any questions you have with your health care provider. Document Released: 04/02/2008 Document Revised: 03/22/2016 Document Reviewed: 10/06/2014 Elsevier Interactive Patient Education  [employer].   It was a pleasure seeing you today.    Today we discussed your well exam and vaginal irritation  For your irrtation: I have done a wet prep, GC/chlamydia test, HIV screen, and syphilis screen. I will either call or send a letter with results.   I have also drawn a lipid panel and A1C today. I will either call or send a letter with the results.  Since your OB is controlling your estrogen therapy you should call her to receive estrogen cream if desired.   Please follow up in 1 year for an annual physical or sooner if symptoms persist or worsen. Please call the clinic immediately if you have any concerns.   Our clinic's number is [phone]. Please call with questions or concerns.    Thank you,  Caroline More, DO

## 2018-03-14 NOTE — Assessment & Plan Note (Signed)
Referral for colonoscopy placed and patient given handout on how to call and schedule appointment

## 2018-03-14 NOTE — Assessment & Plan Note (Signed)
Obtained A1C today

## 2018-03-14 NOTE — Assessment & Plan Note (Signed)
Obtained lipid panel today. 

## 2018-03-14 NOTE — Assessment & Plan Note (Addendum)
Likely 2/2 increased dryness 2/2 menapause after hysterectomy. Patient without increased discharge, however there is odor. Patient states she has had yeast infections in the past so understands how they feel. Patient requesting diflucan today as she did not want to wait for wet prep results. Have prescribed 1 dose diflucan to be taken for possible yeast infection. Have draw RPR and HIV, and collected wet prep, GC/Chlamyidea, and urine studies to rule out other causes of possible vaginal irritation. Patient requesting full STD work up as she was separated for 8 months from partner. Patient requesting all STD results be discussed over the phone. Will plan to call with results. If wet prep or GC/chlamydia are positive will call in RX to pharmacy [facility]).   Given that patient is being managed by OB/GYN for oral estrogen therapy will defer any other estrogen creams to OB. Patient aware and in agreement with plan.   Discussed patient with Dr. Gwendlyn Deutscher

## 2018-03-15 LAB — LIPID PANEL
CHOLESTEROL TOTAL: 208 mg/dL — AB (ref 100–199)
Chol/HDL Ratio: 3.4 ratio (ref 0.0–4.4)
HDL: 62 mg/dL (ref >39)
LDL Calculated: 130 mg/dL — ABNORMAL HIGH (ref 0–99)
TRIGLYCERIDES: 80 mg/dL (ref 0–149)
VLDL Cholesterol Cal: 16 mg/dL (ref 5–40)

## 2018-03-15 LAB — HIV ANTIBODY (ROUTINE TESTING W REFLEX): HIV SCREEN 4TH GENERATION: NONREACTIVE

## 2018-03-15 LAB — RPR: RPR: NONREACTIVE

## 2018-03-17 ENCOUNTER — Telehealth: Payer: Self-pay | Admitting: *Deleted

## 2018-03-17 ENCOUNTER — Encounter: Payer: Self-pay | Admitting: Internal Medicine

## 2018-03-17 LAB — CERVICOVAGINAL ANCILLARY ONLY
Chlamydia: NEGATIVE
Neisseria Gonorrhea: NEGATIVE

## 2018-03-17 NOTE — Telephone Encounter (Signed)
Pt left message on 5/17 @ 1553 stating that she is having vaginal dryness. She is currently taking estrogen but the dryness is new and irritating. She is requesting Rx for same. Per chart review, pt saw PCP prior to leaving voice mail message. Notes state that management of pt c/o to be deferred to Gyn. Message sent to Dr. Harolyn Rutherford for review and plan of care.

## 2018-03-18 ENCOUNTER — Telehealth: Payer: Self-pay

## 2018-03-18 NOTE — Telephone Encounter (Signed)
Since she had a negative wet prep, patient can just use an OTC hypoallergenic (no perfumes or extra dyes/ingredients) water based lubricant or silicone based lubricant (Astroglide) during intercourse.  If this is not sufficient, she can be prescribed some vaginal estrogen (tablets, suppositories or creams). She can also do some research into Osphena; this is a new oral pill marketed towards vaginal dryness in menopausal patients.  Please call to inform patient of recommendations.  Verita Schneiders, MD, [place] for [employer], [place]

## 2018-03-18 NOTE — Telephone Encounter (Signed)
Pt returning call to MD regarding lab results. Call back number [phone] Wallace Cullens, RN

## 2018-03-19 ENCOUNTER — Ambulatory Visit: Payer: 59 | Admitting: Neurology

## 2018-03-19 ENCOUNTER — Other Ambulatory Visit: Payer: Self-pay | Admitting: Family Medicine

## 2018-03-19 ENCOUNTER — Telehealth: Payer: Self-pay | Admitting: Family Medicine

## 2018-03-19 ENCOUNTER — Encounter: Payer: Self-pay | Admitting: Neurology

## 2018-03-19 VITALS — BP 150/94 | HR 74 | Ht 61.0 in | Wt 201.0 lb

## 2018-03-19 DIAGNOSIS — E7841 Elevated Lipoprotein(a): Secondary | ICD-10-CM

## 2018-03-19 DIAGNOSIS — G43709 Chronic migraine without aura, not intractable, without status migrainosus: Secondary | ICD-10-CM

## 2018-03-19 MED ORDER — ATORVASTATIN CALCIUM 10 MG PO TABS: 10.0000 mg | ORAL_TABLET | Freq: Every day | ORAL | 0 refills | Status: DC

## 2018-03-19 MED FILL — ATORVASTATIN 10 MG TABLET: 10 | 90 days supply | Qty: 90 | Fill #0

## 2018-03-19 NOTE — Telephone Encounter (Signed)
Given elevated LDL and total cholesterol levels despite treatment with simvastatin and diet modifications will begin atorvastatin. Will start at low dose and then titrate up as needed. Patient aware and recommended [facility] as pharmacy.   [name], PGY-1 [facility] 03/19/2018 5:22 PM

## 2018-03-19 NOTE — Telephone Encounter (Signed)
Called patient with results.   [name], PGY-1 [facility] 03/19/2018 5:24 PM

## 2018-03-19 NOTE — Progress Notes (Signed)
[facility]    Provider:  Dr Jaynee Eagles Referring Provider: Caroline More, DO Primary Care Physician:  Caroline More, DO  CC: Migraines  Interval history 03/19/2018: The Ajovy has helped with the headaches she likes it. She has had a great response. 2 migraines in April lasted 45 minutes. Most of the month she had no headaches she had some but not severe. Feeling great. Will continue Ajovy. Discussed botox and other treatments.   Interval History 09/10/2017: At last appointment ordered MRIs of the brain and orbits, they were not completed. Also ordered physical therapy but finished only 2/13 visits. Had surgery 08/15/2017. hysterectomy, Bilateral salpingectomy, left oopherectomy. She can't afford MRI or PT. Started Aimovig, but she could not get it. Will try Ajovy, will give a copay ard.    HPI:  [name] is a 52 y.o. female here as a referral from Dr. Tammi Klippel for migraines. Past medical history of migraines, high blood pressure, anxiety, depression, asthma.  Headaches wake her up in the morning. Migraines since the age of 38. Worse this year, she was taking care of her grandmother and passed away and there has been stress and a bad relationship. She has had migraine for days after getting a flu shot. She was a patient of the [facility] center. They start at the back of there head and spread all ove rthe head, she has tenderness of the skin, She has a prodrome feels the pain in the neck. For the last year. She has light and sound sensitivity, it is pulsating and throbbing and pounding with nausea and vomiting and also has vision changes and episodic vision loss unknown eye. Migraines can last weeks, has had one for weeks now, she was in the ED last Saturday, she is missing work. Sleeping in a dark room helps. Stress worsens it. Woke up with a headache again this morning and she has neck pain and neck tightness. Reports daily headaches and at least 15 are migrainous for  the last 9 months. She has allergies as well. No medication overuse. No aura. No other focal neurologic deficits, associated symptoms, inciting events or modifiable factors. Lots of stress.   Medications tried include: Baclofen, Imitrex, Lexapro, gabapentin, losartan, naproxen, Zofran, propranolol, tramadol. She tried amitriptyline and has an allergy to it, zomig  Reviewed notes, labs and imaging from outside physicians, which showed:  presented the emergency room for evaluation of intermittent, generalized headache for the past 4 days on 07/20/2017. Patient had associated dizziness and feeling off balance with nausea. The headache began in the back of the head and radiated to the front. Similar to previous migraines. She tried ibuprofen. The past she received several Botox injections with provided her with 1-2 years relief in her migraines. Denied any numbness, fevers, chills, headache injury, falls, blood thinner use. She was given Compazine, Benadryl and IV fluids in the emergency room. Symptoms reviewed and she was discharged.   Review of Systems: Patient complains of symptoms per HPI as well as the following symptoms: Shortness of breath, cough, wheezing, snoring, aching muscles, allergies, headache, dizziness, depression, anxiety, changes in appetite. Pertinent negatives and positives per HPI. All others negative.   Social History   Socioeconomic History  . Marital status: Single    Spouse name: Not on file  . Number of children: Not on file  . Years of education: Not on file  . Highest education level: Not on file  Occupational History  . Not on file  Social Needs  .  Financial resource strain: Not on file  . Food insecurity:    Worry: Not on file    Inability: Not on file  . Transportation needs:    Medical: Not on file    Non-medical: Not on file  Tobacco Use  . Smoking status: Former Smoker    Packs/day: 0.25    Years: 15.00    Pack years: 3.75    Types: Cigarettes     Last attempt to quit: 02/16/2003    Years since quitting: 15.0  . Smokeless tobacco: Never Used  Substance and Sexual Activity  . Alcohol use: Yes    Comment: socially  . Drug use: No  . Sexual activity: Not Currently    Partners: Male    Birth control/protection: Surgical  Lifestyle  . Physical activity:    Days per week: Not on file    Minutes per session: Not on file  . Stress: Not on file  Relationships  . Social connections:    Talks on phone: Not on file    Gets together: Not on file    Attends religious service: Not on file    Active member of club or organization: Not on file    Attends meetings of clubs or organizations: Not on file    Relationship status: Not on file  . Intimate partner violence:    Fear of current or ex partner: Not on file    Emotionally abused: Not on file    Physically abused: Not on file    Forced sexual activity: Not on file  Other Topics Concern  . Not on file  Social History Narrative   ** Merged History Encounter **        Family History  Problem Relation Age of Onset  . Diabetes Maternal Grandmother   . Anesthesia problems Neg Hx   . Headache Neg Hx   . Aneurysm Neg Hx     Past Medical History:  Diagnosis Date  . Anemia   . Anxiety   . Asthma   . COPD (chronic obstructive pulmonary disease) ([place])   . Depression   . Dysrhythmia    Irregular heat w/ asthma episodes and anxiety  . GERD (gastroesophageal reflux disease)   . Hypertension   . Migraines   . PONV (postoperative nausea and vomiting)   . Pre-diabetes   . Sleep apnea    uses CPAP  . Urinary incontinence    frequent urination- tx w/ vesicare  . Urinary urgency     Past Surgical History:  Procedure Laterality Date  . ABDOMINAL HYSTERECTOMY Bilateral 08/15/2017   Procedure: SUPRACERVICAL HYSTERECTOMY ABDOMINAL W/ BILATERAL SALPINGECTOMY AND LEFT OOPHORECTOMY;  Surgeon: Osborne Oman, MD;  Location: [facility];  Service: Gynecology;  Laterality: Bilateral;  .  ABLATION    . CESAREAN SECTION     x3  . DILATION AND CURETTAGE OF UTERUS  05/31/2005   MAB, TAB x 2  . HERNIA REPAIR  1990's  . HYSTEROSCOPY W/ ENDOMETRIAL ABLATION    . LEEP    . MAB     x 4  . MOUTH SURGERY     teeth extraction  . TUBAL LIGATION  11/01/2006  . WISDOM TOOTH EXTRACTION      Current Outpatient Medications  Medication Sig Dispense Refill  . AJOVY 225 MG/1.5ML SOSY INJECT EVERY 30 DAY INTO THE SKIN  11  . albuterol (PROVENTIL) (2.5 MG/3ML) 0.083% nebulizer solution Take 3 mLs (2.5 mg total) every 4 (four) hours as needed by  nebulization. 75 mL 6  . albuterol (VENTOLIN HFA) 108 (90 Base) MCG/ACT inhaler INHALE 2 PUFFS BY MOUTH EVERY 3-4 HOURS AS NEEDED FOR SHORTNESS OF BREATH\WHEEZING\RECURRENT COUGH 18 g 6  . atorvastatin (LIPITOR) 10 MG tablet Take 1 tablet (10 mg total) by mouth daily. 90 tablet 0  . baclofen (LIORESAL) 10 MG tablet Take 1 tablet (10 mg total) by mouth 3 (three) times daily as needed (headaches). 60 each 1  . BIOTIN PO Take 2,000 mg by mouth daily.    . cetirizine (ZYRTEC) 10 MG tablet Take 1 tablet (10 mg total) daily by mouth. 30 tablet 11  . diclofenac (VOLTAREN) 75 MG EC tablet Take 1 tablet (75 mg total) by mouth 2 (two) times daily with a meal. 60 tablet 0  . eletriptan (RELPAX) 40 MG tablet Take 1 tablet (40 mg total) by mouth as needed for migraine or headache. May repeat in 2 hours if headache persists or recurs. 10 tablet 11  . escitalopram (LEXAPRO) 20 MG tablet Take 1 tablet (20 mg total) daily by mouth. 30 tablet 3  . esomeprazole (NEXIUM) 40 MG capsule TAKE 1 CAPSULE BY MOUTH ONCE DAILY 30 capsule 6  . estrogen-methylTESTOSTERone 0.625-1.25 MG tablet Take 1 tablet daily by mouth. 90 tablet 5  . fluticasone (FLONASE) 50 MCG/ACT nasal spray PLACE 2 SPRAYS INTO THE NOSE DAILY. 16 g 6  . furosemide (LASIX) 20 MG tablet Take 1 tablet (20 mg total) by mouth daily. 30 tablet 3  . gabapentin (NEURONTIN) 300 MG capsule Take 1 capsule (300 mg total)  by mouth every 8 (eight) hours as needed (PAIN). Can take 600 mg at night prn hot flashes 90 capsule 3  . losartan (COZAAR) 50 MG tablet TAKE 1 TABLET BY MOUTH ONCE DAILY 30 tablet 1  . meloxicam (MOBIC) 15 MG tablet Take 1 tablet (15 mg total) by mouth daily. 10 tablet 0  . methocarbamol (ROBAXIN) 500 MG tablet Take 1 tablet (500 mg total) by mouth 2 (two) times daily. 20 tablet 0  . mometasone-formoterol (DULERA) 200-5 MCG/ACT AERO Inhale 2 puffs into the lungs 2 (two) times daily. 8.8 g 5  . montelukast (SINGULAIR) 10 MG tablet Take 1 tablet (10 mg total) by mouth at bedtime. 90 tablet 3  . Multiple Vitamin (MULTIVITAMIN WITH MINERALS) TABS tablet Take 1 tablet by mouth daily.    . naproxen (NAPROSYN) 500 MG tablet Take 1 tablet (500 mg total) by mouth 2 (two) times daily as needed for mild pain or moderate pain. 60 tablet 0  . NONFORMULARY OR COMPOUNDED ITEM Inject 1 application every 30 (thirty) days into the skin. 1 each 11  . ondansetron (ZOFRAN) 4 MG tablet Take 1 tablet (4 mg total) by mouth every 8 (eight) hours as needed for nausea or vomiting. 6 tablet 0  . oxybutynin (DITROPAN-XL) 10 MG 24 hr tablet TAKE 1 TABLET BY MOUTH AT BEDTIME 30 tablet 0  . Pyridoxine HCl (VITAMIN B-6 PO) Take 1 tablet by mouth daily. Unknown OTC strength    . Spacer/Aero-Holding Chambers (AEROCHAMBER MV) inhaler Use as instructed 1 each 0  . SPIRIVA RESPIMAT 2.5 MCG/ACT AERS INHALE 2 PUFFS BY MOUTH DAILY. 4 g 3  . SUMAtriptan (IMITREX) 50 MG tablet Take 50 mg by mouth every 2 (two) hours as needed for migraine. May repeat in 2 hours if headache persists or recurs.    . traMADol (ULTRAM) 50 MG tablet Take 2 tablets (100 mg total) by mouth every 6 (six) hours as  needed. (Patient taking differently: Take 100 mg by mouth every 6 (six) hours as needed for moderate pain. ) 30 tablet 0  . vitamin C (ASCORBIC ACID) 500 MG tablet Take 500 mg by mouth daily.      No current facility-administered medications for this  visit.     Allergies as of 03/19/2018 - Review Complete 03/19/2018  Allergen Reaction Noted  . Amitriptyline  06/07/2013  . Fluticasone-salmeterol Other (See Comments) 05/11/2010  . Penicillins Itching and Swelling 07/17/2013  . Ciprofloxacin Itching and Rash 09/01/2012  . Macrobid [nitrofurantoin monohyd macro] Itching 09/01/2012  . Sulfamethoxazole-trimethoprim Rash     Vitals: BP (!) 150/94   Pulse 74   Ht 5\' 1"  (1.549 m)   Wt 201 lb (91.2 kg)   LMP 05/21/2017 (Within Weeks)   BMI 37.98 kg/m  Last Weight:  Wt Readings from Last 1 Encounters:  03/19/18 201 lb (91.2 kg)   Last Height:   Ht Readings from Last 1 Encounters:  03/19/18 5\' 1"  (1.549 m)    Physical exam: Exam: Gen: NAD, conversant, well nourised, obese, well groomed                     CV: RRR, no MRG. No Carotid Bruits. No peripheral edema, warm, nontender Eyes: Conjunctivae clear without exudates or hemorrhage  Neuro: Detailed Neurologic Exam  Speech:    Speech is normal; fluent and spontaneous with normal comprehension.  Cognition:    The patient is oriented to person, place, and time;     recent and remote memory intact;     language fluent;     normal attention, concentration,     fund of knowledge Cranial Nerves:    The pupils are equal, round, and reactive to light. The fundi are normal and spontaneous venous pulsations are present. Visual fields are full to finger confrontation. Extraocular movements are intact. Trigeminal sensation is intact and the muscles of mastication are normal. The face is symmetric. The palate elevates in the midline. Hearing intact. Voice is normal. Shoulder shrug is normal. The tongue has normal motion without fasciculations.   Coordination:    Normal finger to nose and heel to shin. Normal rapid alternating movements.   Gait:    Heel-toe and tandem gait are normal.   Motor Observation:    No asymmetry, no atrophy, and no involuntary movements noted. Tone:     Normal muscle tone.    Posture:    Posture is normal. normal erect    Strength:    Strength is V/V in the upper and lower limbs.      Sensation: intact to LT     Reflex Exam:  DTR's:    Deep tendon reflexes in the upper and lower extremities are normal bilaterally.   Toes:    The toes are downgoing bilaterally.   Clonus:    Clonus is absent.    Assessment/Plan:  52 year old with chronic migraines with status migrainosus, positional headaches, vision loss, pain on eye movement, morning headaches even though she is treated effectively with CPAP, morbid obesity, myofascial cervical neck pain. She has failed multiple migraine medication classes.    Imaging: Given the positional nature of the headaches.need MRI of the brain to evaluate for lesions, tumors, intracranial HTN, Also MRI orbits due to pain on eye movement to eval for orbital psudotumor. She declined in the past and now feeling much better on Ajovy.  Physical Therapy: Cervical myofascial pain, forward posture contributing to migraines  and cervicalgia. Please evaluate and treat including dry needling, stretching, strengthening, manual therapy/massage, heating, TENS unit, exercising for scapular stabilization, pectoral stretching and rhomboid strengthening as clinically warranted as well as any other modality as recommended by evaluation.  Continue Ajovy, doing very well   Sarina Ill, MD  [facility] [address] [place], [place] [address]  Phone [phone] Fax [phone]  A total of 15 minutes was spent face-to-face with this patient. Over half this time was spent on counseling patient on the chronic migraines diagnosis and different diagnostic and therapeutic options available.

## 2018-03-20 NOTE — Telephone Encounter (Signed)
Called pt to inform her of recommendations from Dr. Harolyn Rutherford. Message left for pt to call back and give permission to leave detailed information on her voice mail.

## 2018-03-26 ENCOUNTER — Telehealth: Payer: Self-pay | Admitting: General Practice

## 2018-03-26 NOTE — Telephone Encounter (Signed)
Patient called and left message on nurse voicemail line stating she is returning our phone call from last week. Patient states we can leave a detailed message on her voicemail if she doesn't answer. Patient states she needs a prescription for vaginal dryness because it is making sexual intercourse uncomfortable.   Called patient & reviewed Dr Arther Abbott recommendations with her. Patient verbalized understanding & had no questions.

## 2018-03-28 ENCOUNTER — Telehealth: Payer: Self-pay | Admitting: Internal Medicine

## 2018-03-28 NOTE — Telephone Encounter (Signed)
Received forms from [employer] through inter office mail Placed in nurse box to be reviewed and signed

## 2018-03-28 NOTE — Telephone Encounter (Signed)
Placed in DR's folder to fill out and sign.

## 2018-04-01 NOTE — Telephone Encounter (Signed)
Forms signed and being back to [employer] thru interoffice mail.

## 2018-04-09 ENCOUNTER — Other Ambulatory Visit: Payer: Self-pay

## 2018-04-09 ENCOUNTER — Other Ambulatory Visit: Payer: Self-pay | Admitting: Family Medicine

## 2018-04-09 ENCOUNTER — Other Ambulatory Visit: Payer: Self-pay | Admitting: Obstetrics & Gynecology

## 2018-04-09 ENCOUNTER — Ambulatory Visit (AMBULATORY_SURGERY_CENTER): Payer: Self-pay

## 2018-04-09 VITALS — Ht 62.0 in | Wt 199.0 lb

## 2018-04-09 DIAGNOSIS — R232 Flushing: Secondary | ICD-10-CM

## 2018-04-09 DIAGNOSIS — Z1211 Encounter for screening for malignant neoplasm of colon: Secondary | ICD-10-CM

## 2018-04-09 DIAGNOSIS — Z90711 Acquired absence of uterus with remaining cervical stump: Secondary | ICD-10-CM

## 2018-04-09 MED ORDER — NA SULFATE-K SULFATE-MG SULF 17.5-3.13-1.6 GM/177ML PO SOLN: 1.0000 | Freq: Once | ORAL | 0 refills | Status: AC

## 2018-04-09 MED FILL — AJOVY 225 MG/1.5ML SOSY: 225 | 30 days supply | Qty: 2 | Fill #5

## 2018-04-09 MED FILL — ESOMEPRAZOLE MAG DR 40 MG C: 40 | 30 days supply | Qty: 30 | Fill #1

## 2018-04-09 MED FILL — SPIRIVA RESPIMAT INHAL SPRY: 2.5 | 30 days supply | Qty: 4 | Fill #3

## 2018-04-09 MED FILL — VENTOLIN HFA 90 MCG INHALER: 108 (90 BAS | 17 days supply | Qty: 18 | Fill #3

## 2018-04-09 MED FILL — SUPREP BOWEL PREP KIT: 17.5-3.13-1 | 1 days supply | Qty: 354 | Fill #0

## 2018-04-09 MED FILL — DULERA 200 MCG/5 MCG INH: 200-5 | 30 days supply | Qty: 13 | Fill #3

## 2018-04-09 MED FILL — FUROSEMIDE 20 MG TABS: 20 | 30 days supply | Qty: 30 | Fill #1

## 2018-04-09 MED FILL — CETIRIZINE HCL 10 MG TABS: 10 | 30 days supply | Qty: 30 | Fill #1

## 2018-04-09 NOTE — Progress Notes (Signed)
No egg or soy allergy known to patient  No issues with past sedation with any surgeries  or procedures, no intubation problems  No diet pills per patient No home 02 use per patient  No blood thinners per patient  Pt denies issues with constipation  No A fib or A flutter  EMMI video sent to pt's e mail , sent to email  

## 2018-04-10 ENCOUNTER — Telehealth: Payer: Self-pay

## 2018-04-10 ENCOUNTER — Encounter: Payer: Self-pay | Admitting: Internal Medicine

## 2018-04-10 MED FILL — OXYBUTYNIN CL ER 10 MG TAB: 10 | 30 days supply | Qty: 30 | Fill #0

## 2018-04-10 MED FILL — ESCITALOPRAM 20 MG TABLET: 20 | 30 days supply | Qty: 30 | Fill #0

## 2018-04-10 MED FILL — LOSARTAN POTASSIUM 50 MG TA: 50 | 30 days supply | Qty: 30 | Fill #0

## 2018-04-10 NOTE — Telephone Encounter (Signed)
Patient called to report joint and muscle aches on Lipitor. Symptoms started about three days after starting new prescription. Aches all over. Stopped taking as of yesterday. No similar symptoms while on Simvastatin.  Note to PCP to advise.  Call back is [phone]

## 2018-04-10 NOTE — Telephone Encounter (Signed)
We received a prior authorization request for her Ajovy. I have completed and submitted the PA on Cover My Meds and should have a determination within 48-72 hours.  Cover My Meds Key: Tami Ribas

## 2018-04-11 ENCOUNTER — Other Ambulatory Visit: Payer: Self-pay | Admitting: Obstetrics & Gynecology

## 2018-04-11 ENCOUNTER — Other Ambulatory Visit: Payer: Self-pay | Admitting: Family Medicine

## 2018-04-11 DIAGNOSIS — Z90711 Acquired absence of uterus with remaining cervical stump: Secondary | ICD-10-CM

## 2018-04-11 DIAGNOSIS — E785 Hyperlipidemia, unspecified: Secondary | ICD-10-CM

## 2018-04-11 DIAGNOSIS — R232 Flushing: Secondary | ICD-10-CM

## 2018-04-11 MED ORDER — SIMVASTATIN 40 MG PO TABS: 40.0000 mg | ORAL_TABLET | Freq: Every day | ORAL | 1 refills | Status: DC

## 2018-04-11 MED FILL — SIMVASTATIN 40 MG TABLET: 40 | 90 days supply | Qty: 90 | Fill #0

## 2018-04-11 NOTE — Progress Notes (Signed)
Received message that patient was having side effects from atorvastatin. Patient previously able to tolerate simvastatin well.  Given that patient had good tolerability with simvastatin will place patient back on simvastatin for hyper lipidemia.  Please inform patient that atorvastatin was discontinued and simvastatin was sent to [facility] outpatient pharmacy.  Dalphine Handing, PGY-1 [facility] 04/11/2018 11:44 AM

## 2018-04-11 NOTE — Telephone Encounter (Signed)
Attempted to call patient to inform her that her atorvastatin was discontinued and she is to go back on simvastatin for hyper lipidemia.    There was no answer and no voice mail to leave a message.  [name], [place]

## 2018-04-11 NOTE — Telephone Encounter (Signed)
Given that patient had good tolerability with simvastatin will place patient back on simvastatin for hyper lipidemia.  Please inform patient that atorvastatin was discontinued and simvastatin was sent to [facility] outpatient pharmacy.

## 2018-04-15 NOTE — Telephone Encounter (Signed)
PA for Ajovy has been approved by [employer] from 04/09/18 to 04/09/19. PA ref number is [id number].

## 2018-04-23 ENCOUNTER — Ambulatory Visit (AMBULATORY_SURGERY_CENTER): Payer: 59 | Admitting: Internal Medicine

## 2018-04-23 ENCOUNTER — Encounter: Payer: Self-pay | Admitting: Internal Medicine

## 2018-04-23 ENCOUNTER — Other Ambulatory Visit: Payer: Self-pay

## 2018-04-23 VITALS — BP 124/73 | HR 64 | Temp 97.8°F | Resp 18 | Ht 62.0 in | Wt 199.0 lb

## 2018-04-23 DIAGNOSIS — Z1212 Encounter for screening for malignant neoplasm of rectum: Secondary | ICD-10-CM

## 2018-04-23 DIAGNOSIS — Z1211 Encounter for screening for malignant neoplasm of colon: Secondary | ICD-10-CM

## 2018-04-23 DIAGNOSIS — J449 Chronic obstructive pulmonary disease, unspecified: Secondary | ICD-10-CM | POA: Diagnosis not present

## 2018-04-23 DIAGNOSIS — I10 Essential (primary) hypertension: Secondary | ICD-10-CM | POA: Diagnosis not present

## 2018-04-23 MED ORDER — SODIUM CHLORIDE 0.9 % IV SOLN
500.0000 mL | Freq: Once | INTRAVENOUS | Status: DC
Start: 2018-04-23 — End: 2022-08-07

## 2018-04-23 NOTE — Patient Instructions (Signed)
YOU HAD AN ENDOSCOPIC PROCEDURE TODAY AT THE [facility]:   Refer to the procedure report that was given to you for any specific questions about what was found during the examination.  If the procedure report does not answer your questions, please call your gastroenterologist to clarify.  If you requested that your care partner not be given the details of your procedure findings, then the procedure report has been included in a sealed envelope for you to review at your convenience later.  YOU SHOULD EXPECT: Some feelings of bloating in the abdomen. Passage of more gas than usual.  Walking can help get rid of the air that was put into your GI tract during the procedure and reduce the bloating. If you had a lower endoscopy (such as a colonoscopy or flexible sigmoidoscopy) you may notice spotting of blood in your stool or on the toilet paper. If you underwent a bowel prep for your procedure, you may not have a normal bowel movement for a few days.  Please Note:  You might notice some irritation and congestion in your nose or some drainage.  This is from the oxygen used during your procedure.  There is no need for concern and it should clear up in a day or so.  SYMPTOMS TO REPORT IMMEDIATELY:   Following lower endoscopy (colonoscopy or flexible sigmoidoscopy):  Excessive amounts of blood in the stool  Significant tenderness or worsening of abdominal pains  Swelling of the abdomen that is new, acute  Fever of 100F or higher  For urgent or emergent issues, a gastroenterologist can be reached at any hour by calling [phone].   DIET:  We do recommend a small meal at first, but then you may proceed to your regular diet.  Drink plenty of fluids but you should avoid alcoholic beverages for 24 hours.  ACTIVITY:  You should plan to take it easy for the rest of today and you should NOT DRIVE or use heavy machinery until tomorrow (because of the sedation medicines used during the test).     FOLLOW UP: Our staff will call the number listed on your records the next business day following your procedure to check on you and address any questions or concerns that you may have regarding the information given to you following your procedure. If we do not reach you, we will leave a message.  However, if you are feeling well and you are not experiencing any problems, there is no need to return our call.  We will assume that you have returned to your regular daily activities without incident.  If any biopsies were taken you will be contacted by phone or by letter within the next 1-3 weeks.  Please call us at [phone] if you have not heard about the biopsies in 3 weeks.    SIGNATURES/CONFIDENTIALITY: You and/or your care partner have signed paperwork which will be entered into your electronic medical record.  These signatures attest to the fact that that the information above on your After Visit Summary has been reviewed and is understood.  Full responsibility of the confidentiality of this discharge information lies with you and/or your care-partner. 

## 2018-04-23 NOTE — Op Note (Signed)
[facility] Patient Name: [name] Procedure Date: 04/23/2018 1:35 PM MRN: [id number] Endoscopist: Jerene Bears , MD Age: 52 Referring MD:  Date of Birth: 08/08/1966 Gender: Female Account #: [id number] Procedure:                Colonoscopy Indications:              Screening for colorectal malignant neoplasm, This                            is the patient's first colonoscopy Medicines:                Monitored Anesthesia Care Procedure:                Pre-Anesthesia Assessment:                           - Prior to the procedure, a History and Physical                            was performed, and patient medications and                            allergies were reviewed. The patient's tolerance of                            previous anesthesia was also reviewed. The risks                            and benefits of the procedure and the sedation                            options and risks were discussed with the patient.                            All questions were answered, and informed consent                            was obtained. Prior Anticoagulants: The patient has                            taken no previous anticoagulant or antiplatelet                            agents. ASA Grade Assessment: II - A patient with                            mild systemic disease. After reviewing the risks                            and benefits, the patient was deemed in                            satisfactory condition to undergo the procedure.  After obtaining informed consent, the colonoscope                            was passed under direct vision. Throughout the                            procedure, the patient's blood pressure, pulse, and                            oxygen saturations were monitored continuously. The                            Colonoscope was introduced through the anus and                            advanced to the cecum,  identified by appendiceal                            orifice and ileocecal valve. The colonoscopy was                            performed without difficulty. The patient tolerated                            the procedure well. The quality of the bowel                            preparation was good. The ileocecal valve,                            appendiceal orifice, and rectum were photographed. Scope In: 1:43:26 PM Scope Out: 1:54:27 PM Scope Withdrawal Time: 0 hours 7 minutes 6 seconds  Total Procedure Duration: 0 hours 11 minutes 1 second  Findings:                 The digital rectal exam was normal.                           The entire examined colon appeared normal on direct                            and retroflexion views. Complications:            No immediate complications. Estimated Blood Loss:     Estimated blood loss: none. Impression:               - The entire examined colon is normal on direct and                            retroflexion views.                           - No specimens collected. Recommendation:           - Patient has a contact number available for  emergencies. The signs and symptoms of potential                            delayed complications were discussed with the                            patient. Return to normal activities tomorrow.                            Written discharge instructions were provided to the                            patient.                           - Resume previous diet.                           - Continue present medications.                           - Repeat colonoscopy in 10 years for screening                            purposes. Jerene Bears, MD 04/23/2018 1:59:05 PM This report has been signed electronically.

## 2018-04-23 NOTE — Progress Notes (Signed)
Report given to PACU, vss 

## 2018-04-24 ENCOUNTER — Telehealth: Payer: Self-pay

## 2018-04-24 ENCOUNTER — Telehealth: Payer: Self-pay | Admitting: *Deleted

## 2018-04-24 NOTE — Telephone Encounter (Signed)
Post procedure second phone call, had to leave voicemail

## 2018-04-24 NOTE — Telephone Encounter (Signed)
Left message on f/u call 

## 2018-05-12 ENCOUNTER — Emergency Department (HOSPITAL_BASED_OUTPATIENT_CLINIC_OR_DEPARTMENT_OTHER): Payer: 59

## 2018-05-12 ENCOUNTER — Encounter (HOSPITAL_BASED_OUTPATIENT_CLINIC_OR_DEPARTMENT_OTHER): Payer: Self-pay | Admitting: *Deleted

## 2018-05-12 ENCOUNTER — Emergency Department (HOSPITAL_BASED_OUTPATIENT_CLINIC_OR_DEPARTMENT_OTHER)
Admission: EM | Admit: 2018-05-12 | Discharge: 2018-05-12 | Disposition: A | Discharge status: Home / Self Care | Payer: 59 | Attending: Emergency Medicine | Admitting: Emergency Medicine

## 2018-05-12 ENCOUNTER — Other Ambulatory Visit: Payer: Self-pay

## 2018-05-12 DIAGNOSIS — I10 Essential (primary) hypertension: Secondary | ICD-10-CM | POA: Insufficient documentation

## 2018-05-12 DIAGNOSIS — M79605 Pain in left leg: Secondary | ICD-10-CM | POA: Diagnosis not present

## 2018-05-12 DIAGNOSIS — Z79899 Other long term (current) drug therapy: Secondary | ICD-10-CM | POA: Insufficient documentation

## 2018-05-12 DIAGNOSIS — J449 Chronic obstructive pulmonary disease, unspecified: Secondary | ICD-10-CM | POA: Diagnosis not present

## 2018-05-12 DIAGNOSIS — M79604 Pain in right leg: Secondary | ICD-10-CM | POA: Diagnosis not present

## 2018-05-12 DIAGNOSIS — R600 Localized edema: Secondary | ICD-10-CM | POA: Diagnosis not present

## 2018-05-12 DIAGNOSIS — T466X5A Adverse effect of antihyperlipidemic and antiarteriosclerotic drugs, initial encounter: Secondary | ICD-10-CM | POA: Diagnosis not present

## 2018-05-12 DIAGNOSIS — Z789 Other specified health status: Secondary | ICD-10-CM

## 2018-05-12 DIAGNOSIS — Z87891 Personal history of nicotine dependence: Secondary | ICD-10-CM | POA: Diagnosis not present

## 2018-05-12 LAB — CK: CK TOTAL: 150 U/L (ref 38–234)

## 2018-05-12 LAB — URINALYSIS, ROUTINE W REFLEX MICROSCOPIC
Bilirubin Urine: NEGATIVE
GLUCOSE, UA: NEGATIVE mg/dL
HGB URINE DIPSTICK: NEGATIVE
KETONES UR: NEGATIVE mg/dL
LEUKOCYTES UA: NEGATIVE
Nitrite: NEGATIVE
PROTEIN: NEGATIVE mg/dL
Specific Gravity, Urine: <1.005 — ABNORMAL LOW (ref 1.005–1.030)
pH: 6.5 (ref 5.0–8.0)

## 2018-05-12 LAB — BASIC METABOLIC PANEL
ANION GAP: 8 (ref 5–15)
BUN: 17 mg/dL (ref 6–20)
CO2: 30 mmol/L (ref 22–32)
Calcium: 9.5 mg/dL (ref 8.9–10.3)
Chloride: 101 mmol/L (ref 98–111)
Creatinine, Ser: 0.84 mg/dL (ref 0.44–1.00)
GFR calc Af Amer: >60 mL/min (ref >60)
GLUCOSE: 108 mg/dL — AB (ref 70–99)
Potassium: 3.7 mmol/L (ref 3.5–5.1)
Sodium: 139 mmol/L (ref 135–145)

## 2018-05-12 LAB — CBC
HEMATOCRIT: 42.2 % (ref 36.0–46.0)
Hemoglobin: 14.4 g/dL (ref 12.0–15.0)
MCH: 31.7 pg (ref 26.0–34.0)
MCHC: 34.1 g/dL (ref 30.0–36.0)
MCV: 93 fL (ref 78.0–100.0)
PLATELETS: 279 10*3/uL (ref 150–400)
RBC: 4.54 MIL/uL (ref 3.87–5.11)
RDW: 12.3 % (ref 11.5–15.5)
WBC: 5.4 10*3/uL (ref 4.0–10.5)

## 2018-05-12 MED ORDER — ACETAMINOPHEN-CODEINE #3 300-30 MG PO TABS: 1.0000 | ORAL_TABLET | Freq: Four times a day (QID) | ORAL | 0 refills | Status: DC | PRN

## 2018-05-12 MED ORDER — ACETAMINOPHEN 325 MG PO TABS
650.0000 mg | ORAL_TABLET | Freq: Once | ORAL | Status: AC
  Administered 2018-05-12: 650 mg via ORAL
  Filled 2018-05-12: qty 2

## 2018-05-12 MED ORDER — IBUPROFEN 400 MG PO TABS
600.0000 mg | ORAL_TABLET | Freq: Once | ORAL | Status: AC
  Administered 2018-05-12: 600 mg via ORAL
  Filled 2018-05-12: qty 1

## 2018-05-12 NOTE — ED Provider Notes (Signed)
[facility] Provider Note   CSN: [id number] Arrival date & time: 05/12/18  1214     History   Chief Complaint Chief Complaint  Patient presents with  . Leg Pain    HPI [name] is a 52 y.o. female.  HPI   [name] is a 52yo female with a history of prediabetes, hypertension, hyperlipidemia, migraines, sleep apnea, depression, anxiety, asthma who presents to the emergency department for evaluation of bilateral leg pain which is been ongoing for the past month now.  Patient reports that she was started on Lipitor by her PCP about a month ago and she has been having worsening bilateral leg pain ever since.  She reports that her PCP switched her back to simvastatin last week but she has not had any improvement.  She reports pain is located grossly along both legs, but seems to be worse in bilateral calves.  Pain is severe and constant.  Worsened at nighttime and also after she has been on her feet all day.  She reports pain feels "burning" and also states that her legs feel heavy making it difficult to stand up and walk at times.  She reports that there has been some swelling in her bilateral legs and she bought some compression stockings which has not helped her symptoms.  She was checked for DVT at her PCP office two years ago and states she did not have one then.  She denies any new leg swelling since her venous Doppler ultrasound, but pain is worse than previous.  She has been taking gabapentin and ibuprofen without significant relief.  She denies fevers, chills, numbness, weakness, joint swelling, pain elsewhere, hematuria, urinary symptoms. Denies back pain. Is able to ambulate independently.   Past Medical History:  Diagnosis Date  . Allergy   . Anemia   . Anxiety   . Arthritis    all over  . Asthma   . COPD (chronic obstructive pulmonary disease) ([place])   . Depression   . Dysrhythmia    Irregular heat w/ asthma episodes and anxiety  . GERD  (gastroesophageal reflux disease)   . Hyperlipidemia   . Hypertension   . Migraines   . PONV (postoperative nausea and vomiting)   . Pre-diabetes   . Sleep apnea    uses CPAP  . Urinary incontinence    frequent urination- tx w/ vesicare  . Urinary urgency     Patient Active Problem List   Diagnosis Date Noted  . Vaginal irritation 03/14/2018  . Restless leg syndrome 09/05/2017  . Chronic female pelvic pain 08/15/2017  . S/P abdominal supracervical hysterectomy, bilateral salpingectomy, left oophorectomy 08/15/2017  . Chronic migraine without aura with status migrainosus, not intractable 07/26/2017  . Sinusitis 02/25/2017  . Acute bacterial sinusitis 11/22/2016  . Left knee pain 08/29/2016  . Bunion of great toe of right foot 08/29/2016  . Healthcare maintenance 08/01/2016  . Abdominal cramping 12/30/2015  . Bladder spasms 12/30/2015  . Pre-diabetes 10/03/2015  . HTN (hypertension) 10/03/2015  . Fatigue 07/21/2015  . Urinary urgency 07/06/2015  . Flank pain 07/06/2015  . Hot flashes 10/14/2014  . Vitamin D deficiency 11/25/2013  . Cough 09/10/2013  . Lower extremity edema 07/20/2013  . Back pain 06/30/2013  . Migraine 06/07/2013  . Knee pain, bilateral 10/09/2012  . Obstructive sleep apnea 09/08/2010  . HLD (hyperlipidemia) 09/20/2008  . Depression with anxiety 12/26/2006  . RHINITIS, ALLERGIC 12/26/2006  . Asthma 12/26/2006    Past Surgical  History:  Procedure Laterality Date  . ABDOMINAL HYSTERECTOMY Bilateral 08/15/2017   Procedure: SUPRACERVICAL HYSTERECTOMY ABDOMINAL W/ BILATERAL SALPINGECTOMY AND LEFT OOPHORECTOMY;  Surgeon: Osborne Oman, MD;  Location: [facility];  Service: Gynecology;  Laterality: Bilateral;  . ABLATION    . CESAREAN SECTION     x3  . DILATION AND CURETTAGE OF UTERUS  05/31/2005   MAB, TAB x 2  . HERNIA REPAIR  1990's  . HYSTEROSCOPY W/ ENDOMETRIAL ABLATION    . LEEP    . MAB     x 4  . MOUTH SURGERY     teeth extraction  . TUBAL  LIGATION  11/01/2006  . WISDOM TOOTH EXTRACTION       OB History    Gravida  15   Para  8   Term  8   Preterm  0   AB  7   Living  4     SAB  2   TAB  1   Ectopic      Multiple      Live Births               Home Medications    Prior to Admission medications   Medication Sig Start Date End Date Taking? Authorizing Provider  POEUM 353 MG/1.5ML SOSY INJECT EVERY [place] SKIN 09/26/17   [provider]  albuterol (PROVENTIL) (2.5 MG/3ML) 0.083% nebulizer solution Take 3 mLs (2.5 mg total) every 4 (four) hours as needed by nebulization. 09/05/17   Caroline More, DO  albuterol (VENTOLIN HFA) 108 (90 Base) MCG/ACT inhaler INHALE 2 PUFFS BY MOUTH EVERY 3-4 HOURS AS NEEDED FOR SHORTNESS OF BREATH\WHEEZING\RECURRENT COUGH 12/11/17   Laverle Hobby, MD  baclofen (LIORESAL) 10 MG tablet Take 1 tablet (10 mg total) by mouth 3 (three) times daily as needed (headaches). Patient not taking: Reported on 04/23/2018 02/17/16   Olin Hauser, DO  BIOTIN PO Take 2,000 mg by mouth daily.    [provider]  cetirizine (ZYRTEC) 10 MG tablet Take 1 tablet (10 mg total) daily by mouth. 09/05/17   Caroline More, DO  diclofenac (VOLTAREN) 75 MG EC tablet Take 1 tablet (75 mg total) by mouth 2 (two) times daily with a meal. 05/28/17   Stinson, Tanna Savoy, DO  EEMT HS 0.625-1.25 MG tablet TAKE 1 TABLET BY MOUTH ONCE DAILY 04/11/18   Anyanwu, Sallyanne Havers, MD  eletriptan (RELPAX) 40 MG tablet Take 1 tablet (40 mg total) by mouth as needed for migraine or headache. May repeat in 2 hours if headache persists or recurs. 07/26/17   Melvenia Beam, MD  escitalopram (LEXAPRO) 20 MG tablet TAKE 1 TABLET BY MOUTH ONCE DAILY 04/10/18   Caroline More, DO  esomeprazole (NEXIUM) 40 MG capsule TAKE 1 CAPSULE BY MOUTH ONCE DAILY 03/06/18   Caroline More, DO  fluticasone (FLONASE) 50 MCG/ACT nasal spray PLACE 2 SPRAYS INTO THE NOSE DAILY. 09/05/17   Caroline More, DO    furosemide (LASIX) 20 MG tablet Take 1 tablet (20 mg total) by mouth daily. 02/27/18   Rogue Bussing, MD  gabapentin (NEURONTIN) 300 MG capsule Take 1 capsule (300 mg total) by mouth every 8 (eight) hours as needed (PAIN). Can take 600 mg at night prn hot flashes 08/16/17   Anyanwu, Sallyanne Havers, MD  losartan (COZAAR) 50 MG tablet TAKE 1 TABLET BY MOUTH ONCE DAILY 04/10/18   Caroline More, DO  meloxicam (MOBIC) 15 MG tablet Take 1 tablet (15 mg  total) by mouth daily. 10/16/17   Palumbo, April, MD  methocarbamol (ROBAXIN) 500 MG tablet Take 1 tablet (500 mg total) by mouth 2 (two) times daily. 10/16/17   Palumbo, April, MD  mometasone-formoterol (DULERA) 200-5 MCG/ACT AERO Inhale 2 puffs into the lungs 2 (two) times daily. 10/04/17   Laverle Hobby, MD  montelukast (SINGULAIR) 10 MG tablet Take 1 tablet (10 mg total) by mouth at bedtime. 12/30/15   Virginia Crews, MD  Multiple Vitamin (MULTIVITAMIN WITH MINERALS) TABS tablet Take 1 tablet by mouth daily.    [provider]  naproxen (NAPROSYN) 500 MG tablet Take 1 tablet (500 mg total) by mouth 2 (two) times daily as needed for mild pain or moderate pain. 12/11/17   Carlyle Dolly, MD  NONFORMULARY OR COMPOUNDED ITEM Inject 1 application every 30 (thirty) days into the skin. 09/10/17   Melvenia Beam, MD  ondansetron (ZOFRAN) 4 MG tablet Take 1 tablet (4 mg total) by mouth every 8 (eight) hours as needed for nausea or vomiting. 05/29/17   Rasch, Anderson Malta I, NP  oxybutynin (DITROPAN-XL) 10 MG 24 hr tablet TAKE 1 TABLET BY MOUTH AT BEDTIME 04/09/18   Caroline More, DO  Pyridoxine HCl (VITAMIN B-6 PO) Take 1 tablet by mouth daily. Unknown OTC strength    [provider]  simvastatin (ZOCOR) 40 MG tablet Take 1 tablet (40 mg total) by mouth at bedtime. 04/11/18   Caroline More, DO  Spacer/Aero-Holding Chambers (AEROCHAMBER MV) inhaler Use as instructed 05/09/15   Vilinda Boehringer, MD  SPIRIVA RESPIMAT 2.5 MCG/ACT  AERS INHALE 2 PUFFS BY MOUTH DAILY. 12/13/17   Caroline More, DO  SUMAtriptan (IMITREX) 50 MG tablet Take 50 mg by mouth every 2 (two) hours as needed for migraine. May repeat in 2 hours if headache persists or recurs.    [provider]  traMADol (ULTRAM) 50 MG tablet Take 2 tablets (100 mg total) by mouth every 6 (six) hours as needed. Patient taking differently: Take 100 mg by mouth every 6 (six) hours as needed for moderate pain.  06/06/17   Anyanwu, Sallyanne Havers, MD  vitamin C (ASCORBIC ACID) 500 MG tablet Take 500 mg by mouth daily.     [provider]  medroxyPROGESTERone (DEPO-PROVERA) 150 MG/ML injection Inject 1 mL (150 mg total) into the muscle every 3 (three) months. 08/23/11 01/12/12  Lyndee Hensen, MD  medroxyPROGESTERone (PROVERA) 10 MG tablet Take 2 tablets (20 mg total) by mouth daily. 11/27/11 01/12/12  Osborne Oman, MD    Family History Family History  Problem Relation Age of Onset  . Diabetes Maternal Grandmother   . Colon cancer Maternal Grandfather   . Anesthesia problems Neg Hx   . Headache Neg Hx   . Aneurysm Neg Hx   . Esophageal cancer Neg Hx   . Rectal cancer Neg Hx   . Stomach cancer Neg Hx     Social History Social History   Tobacco Use  . Smoking status: Former Smoker    Packs/day: 0.25    Years: 15.00    Pack years: 3.75    Types: Cigarettes    Last attempt to quit: 02/16/2003    Years since quitting: 15.2  . Smokeless tobacco: Never Used  Substance Use Topics  . Alcohol use: Yes    Comment: socially  . Drug use: No     Allergies   Amitriptyline; Fluticasone-salmeterol; Penicillins; Ciprofloxacin; Macrobid [nitrofurantoin monohyd macro]; and Sulfamethoxazole-trimethoprim   Review of Systems Review of Systems  Constitutional: Negative for chills and fever.  Respiratory: Negative for shortness of breath.   Cardiovascular: Positive for leg swelling (bilateral).  Genitourinary: Negative for difficulty urinating, dysuria,  flank pain and frequency.  Musculoskeletal: Positive for myalgias (bilateral leg pain). Negative for gait problem.  Skin: Negative for color change and wound.  Neurological: Negative for weakness and numbness.     Physical Exam Updated Vital Signs BP (!) 146/81 (BP Location: Right Arm)   Pulse 77   Temp 98 F (36.7 C) (Oral)   Resp 19   Ht 5\' 1"  (1.549 m)   Wt 93 kg (205 lb)   LMP 05/21/2017 (Within Weeks)   SpO2 99%   BMI 38.73 kg/m   Physical Exam  Constitutional: She appears well-developed and well-nourished. No distress.  No acute distress, nontoxic-appearing.  HENT:  Head: Normocephalic and atraumatic.  Eyes: Right eye exhibits no discharge. Left eye exhibits no discharge.  Cardiovascular: Normal rate and regular rhythm.  Pulmonary/Chest: Effort normal and breath sounds normal. No stridor. No respiratory distress. She has no wheezes. She has no rales.  Abdominal: Soft. Bowel sounds are normal. There is no tenderness.  Musculoskeletal:  Right calf appears mildly swollen compared to left.  No pitting edema.  No erythema, warmth, rash or wound over the legs.  Tender generally to palpation of bilateral calves and thighs.  DP pulses 2+ and symmetric bilaterally.  Strength 5/5 in bilateral knee flexion/extension and hip flexion/extension.  Sensation to light touch intact in bilateral lower extremities.  Neurological: She is alert. Coordination normal.  Skin: Skin is warm. Capillary refill takes less than 2 seconds. She is not diaphoretic.  Psychiatric: She has a normal mood and affect. Her behavior is normal.  Nursing note and vitals reviewed.    ED Treatments / Results  Labs (all labs ordered are listed, but only abnormal results are displayed) Labs Reviewed  BASIC METABOLIC PANEL - Abnormal; Notable for the following components:      Result Value   Glucose, Bld 108 (*)    All other components within normal limits  URINALYSIS, ROUTINE W REFLEX MICROSCOPIC - Abnormal;  Notable for the following components:   Color, Urine STRAW (*)    Specific Gravity, Urine <1.005 (*)    All other components within normal limits  CK  MYOGLOBIN, URINE  CBC    EKG None  Radiology US Venous Img Lower  Left (dvt Study)  Result Date: 05/12/2018 CLINICAL DATA:  Left lower extremity pain and edema. Evaluate for DVT. EXAM: LEFT LOWER EXTREMITY VENOUS DOPPLER ULTRASOUND TECHNIQUE: Gray-scale sonography with graded compression, as well as color Doppler and duplex ultrasound were performed to evaluate the lower extremity deep venous systems from the level of the common femoral vein and including the common femoral, femoral, profunda femoral, popliteal and calf veins including the posterior tibial, peroneal and gastrocnemius veins when visible. The superficial great saphenous vein was also interrogated. Spectral Doppler was utilized to evaluate flow at rest and with distal augmentation maneuvers in the common femoral, femoral and popliteal veins. COMPARISON:  None. FINDINGS: Contralateral Common Femoral Vein: Respiratory phasicity is normal and symmetric with the symptomatic side. No evidence of thrombus. Normal compressibility. Common Femoral Vein: No evidence of thrombus. Normal compressibility, respiratory phasicity and response to augmentation. Saphenofemoral Junction: No evidence of thrombus. Normal compressibility and flow on color Doppler imaging. Profunda Femoral Vein: No evidence of thrombus. Normal compressibility and flow on color Doppler imaging. Femoral Vein: No evidence of thrombus. Normal compressibility, respiratory phasicity  and response to augmentation. Popliteal Vein: No evidence of thrombus. Normal compressibility, respiratory phasicity and response to augmentation. Calf Veins: No evidence of thrombus. Normal compressibility and flow on color Doppler imaging. Superficial Great Saphenous Vein: No evidence of thrombus. Normal compressibility. Venous Reflux:  None. Other  Findings:  None. IMPRESSION: No evidence of DVT within the left lower extremity. Electronically Signed   By: Sandi Mariscal M.D.   On: 05/12/2018 17:52    Procedures Procedures (including critical care time)  Medications Ordered in ED Medications - No data to display   Initial Impression / Assessment and Plan / ED Course  I have reviewed the triage vital signs and the nursing notes.  Pertinent labs & imaging results that were available during my care of the patient were reviewed by me and considered in my medical decision making (see chart for details).    Patient presents with ongoing bilateral leg pain since starting Lipitor last month. Pain is generalized, constant and burning. She noticed some swelling in the right LE. On exam RLE neurovascularly intact, no concern for ischemic limb. No erythema, warmth or signs of infectious process. CK negative and urine myoglobin WNL, therefore doubt myositis or rhabomylitis. No major electrolyte abnormalities. DVT study of RLE negative. Her symptoms are likely side effect from statin. Patient counseled to d/c this medication and follow up with her PCP regarding further management of her hyperlipidemia. Discussed NSAID use for pain. She has an appointment with PCP tomorrow. Discussed reasons to return to the ED and she agrees and appears reliable.   Final Clinical Impressions(s) / ED Diagnoses   Final diagnoses:  Statin intolerance  Bilateral leg pain    ED Discharge Orders    None       Bernarda Caffey 05/14/18 [id number]    Little, Wenda Overland, MD 05/15/18 [phone]

## 2018-05-12 NOTE — ED Triage Notes (Signed)
Pain in her legs for months. She was started on Gabapentin. She has been wearing compression stockings.

## 2018-05-12 NOTE — Discharge Instructions (Signed)
Your blood work was reassuring.  You do not have a blood clot in your right leg.  Your symptoms are likely related to the statin medicine that you are taking.  Please follow-up with your primary doctor regarding further management of your high cholesterol.  You can take naproxen as needed for pain.  I also written you a prescription for Tylenol with codeine.  Be aware that this medicine can make you drowsy so please do not drive or work while taking it.  Return to the ER if you have any new or concerning symptoms like numbness in which she cannot feel the foot or legs, fever, rash on the legs, worsening swelling.

## 2018-05-13 ENCOUNTER — Ambulatory Visit: Payer: 59 | Admitting: Family Medicine

## 2018-05-13 LAB — MYOGLOBIN, URINE: Myoglobin, Ur: 4 ng/mL (ref 0–13)

## 2018-05-14 MED FILL — ESOMEPRAZOLE MAG DR 40 MG C: 40 | 30 days supply | Qty: 30 | Fill #2

## 2018-05-14 MED FILL — ESCITALOPRAM 20 MG TABLET: 20 | 30 days supply | Qty: 30 | Fill #1

## 2018-05-14 MED FILL — FUROSEMIDE 20 MG TABS: 20 | 30 days supply | Qty: 30 | Fill #2

## 2018-05-14 MED FILL — AJOVY 225 MG/1.5ML SOSY: 225 | 30 days supply | Qty: 2 | Fill #6

## 2018-05-14 MED FILL — LOSARTAN POTASSIUM 50 MG TA: 50 | 30 days supply | Qty: 30 | Fill #1

## 2018-05-14 MED FILL — CETIRIZINE HCL 10 MG TABS: 10 | 100 days supply | Qty: 100 | Fill #2

## 2018-05-14 MED FILL — VENTOLIN HFA 90 MCG INHALER: 108 (90 BAS | 17 days supply | Qty: 18 | Fill #4

## 2018-05-14 MED FILL — DULERA 200 MCG/5 MCG INH: 200-5 | 30 days supply | Qty: 13 | Fill #4

## 2018-05-15 MED FILL — ACETAMINOPHEN/COD #3 TABLET: 300-30 | 4 days supply | Qty: 15 | Fill #0

## 2018-05-23 ENCOUNTER — Ambulatory Visit (INDEPENDENT_AMBULATORY_CARE_PROVIDER_SITE_OTHER): Payer: 59 | Admitting: Family Medicine

## 2018-05-23 VITALS — BP 135/80 | HR 97 | Temp 98.5°F | Wt 202.4 lb

## 2018-05-23 DIAGNOSIS — R635 Abnormal weight gain: Secondary | ICD-10-CM | POA: Diagnosis not present

## 2018-05-23 DIAGNOSIS — E785 Hyperlipidemia, unspecified: Secondary | ICD-10-CM

## 2018-05-23 DIAGNOSIS — M791 Myalgia, unspecified site: Secondary | ICD-10-CM | POA: Diagnosis not present

## 2018-05-23 DIAGNOSIS — R102 Pelvic and perineal pain: Secondary | ICD-10-CM

## 2018-05-23 MED ORDER — GABAPENTIN 300 MG PO CAPS: 300.0000 mg | ORAL_CAPSULE | Freq: Three times a day (TID) | ORAL | 3 refills | Status: DC | PRN

## 2018-05-23 MED FILL — GABAPENTIN 300 MG CAPSULE: 300 | 22 days supply | Qty: 90 | Fill #0

## 2018-05-23 NOTE — Patient Instructions (Signed)
It was a pleasure seeing you today.   Today we discussed your muscle aches  For your aches: I have refilled gabapentin. I have ordered a lipid panel and will call with results.   Please follow up if no improvement or sooner if symptoms persist or worsen. Please call the clinic immediately if you have any concerns.   Our clinic's number is [phone]. Please call with questions or concerns.   Thank you,  Caroline More, DO

## 2018-05-24 LAB — LIPID PANEL
CHOL/HDL RATIO: 3.8 ratio (ref 0.0–4.4)
Cholesterol, Total: 212 mg/dL — ABNORMAL HIGH (ref 100–199)
HDL: 56 mg/dL (ref >39)
LDL Calculated: 113 mg/dL — ABNORMAL HIGH (ref 0–99)
Triglycerides: 214 mg/dL — ABNORMAL HIGH (ref 0–149)
VLDL Cholesterol Cal: 43 mg/dL — ABNORMAL HIGH (ref 5–40)

## 2018-05-26 DIAGNOSIS — R635 Abnormal weight gain: Secondary | ICD-10-CM | POA: Insufficient documentation

## 2018-05-26 DIAGNOSIS — M791 Myalgia, unspecified site: Secondary | ICD-10-CM | POA: Insufficient documentation

## 2018-05-26 NOTE — Assessment & Plan Note (Signed)
Patient requesting lipid panel today.  Will obtain lipid panel.  Encourage patient to give trial of another statin and discussed possibility of every other day use to help limit muscle pain.  Patient refusing statin therapy as she said she does not want possibility of any myalgia.

## 2018-05-26 NOTE — Assessment & Plan Note (Addendum)
Unclear etiology at this time.  Possibly patient cannot tolerate statin therapies.  Unlikely rhabdo my lysis as labs are within normal limits.  Patient at this time refusing any future statin therapies.  Refill for gabapentin given.

## 2018-05-26 NOTE — Assessment & Plan Note (Signed)
Ambulatory referral to weight management placed

## 2018-05-26 NOTE — Progress Notes (Signed)
   Subjective:    Patient ID: [name], female    DOB: May 09, 1966, 52 y.o.   MRN: [id number]   CC: muscle aches  HPI: Muscle aches Patient presenting today for follow-up of muscle aches.  Patient says that they began when she switched from simvastatin to atorvastatin.  Further clarification shows that patient had muscle aches while still on simvastatin but atorvastatin made the pain worse.  Patient says she has been trying to eat healthy because she does not want to be on any statin therapies.  Patient is also exercising.  Patient says naproxen helps pain some.  Patient says gabapentin helps with pain but patient recently ran out.  Patient came in today because she would like to recheck her lipids because she is refusing to take any statin therapies and would rather Try and control her lipids with diet and exercise.  Patient recently seen in the emergency department on 7/15 for this pain.  At that time myoglobin within normal limits, BMP unremarkable, and CK within normal limits.  Weight loss Requesting referral to [facility] weight loss as she has tried diet and exercise on her own without much improvement.   Objective:  BP 135/80 (BP Location: Left Arm, Patient Position: Sitting, Cuff Size: Normal)   Pulse 97   Temp 98.5 F (36.9 C) (Oral)   Wt 202 lb 6.4 oz (91.8 kg)   LMP 05/21/2017 (Within Weeks)   SpO2 97%   BMI 38.24 kg/m  Vitals and nursing note reviewed  General: well nourished, in no acute distress HEENT: normocephalic, moist mucous membranes Cardiac: RRR, clear S1 and S2, no murmurs, rubs, or gallops Respiratory: clear to auscultation bilaterally, no increased work of breathing Abdomen: soft, nontender, nondistended, no masses or organomegaly. Bowel sounds present Extremities: no edema or cyanosis. Warm, well perfused.  Skin: warm and dry, no rashes noted Neuro: alert and oriented, no focal deficits   Assessment & Plan:    Muscle ache Unclear etiology at this time.   Possibly patient cannot tolerate statin therapies.  Unlikely rhabdo my lysis as labs are within normal limits.  Patient at this time refusing any future statin therapies.  Refill for gabapentin given.  HLD (hyperlipidemia) Patient requesting lipid panel today.  Will obtain lipid panel.  Encourage patient to give trial of another statin and discussed possibility of every other day use to help limit muscle pain.  Patient refusing statin therapy as she said she does not want possibility of any myalgia.  Weight gain Ambulatory referral to weight management placed    Return if symptoms worsen or fail to improve.   Caroline More, DO, PGY-2

## 2018-05-28 ENCOUNTER — Other Ambulatory Visit: Payer: Self-pay | Admitting: Family Medicine

## 2018-05-28 MED ORDER — PRAVASTATIN SODIUM 40 MG PO TABS: 40.0000 mg | ORAL_TABLET | Freq: Every day | ORAL | 2 refills | Status: DC

## 2018-05-28 MED FILL — PRAVASTATIN NA 40 MG TAB: 40 | 90 days supply | Qty: 90 | Fill #0

## 2018-05-28 NOTE — Progress Notes (Signed)
Spoke to patient to discuss lipid panel. Patient reports that she has been working on Lifestyle modifications of diet and lifestyle. She understands importance of statins so is willing to try another statin. Discussed every other day dosing to help reduce myalgia. Will prescribe pravastatin as pravastatin is shown to have less myalgia effects. If not tolerated can try every other day dosing.  RX sent to patients desired pharmacy and patient aware.  Dalphine Handing, PGY-2 [facility] 05/28/2018 8:45 AM

## 2018-06-01 ENCOUNTER — Other Ambulatory Visit: Payer: Self-pay

## 2018-06-01 ENCOUNTER — Emergency Department (HOSPITAL_COMMUNITY): Payer: 59

## 2018-06-01 ENCOUNTER — Encounter (HOSPITAL_COMMUNITY): Payer: Self-pay | Admitting: Emergency Medicine

## 2018-06-01 ENCOUNTER — Emergency Department (HOSPITAL_COMMUNITY)
Admission: EM | Admit: 2018-06-01 | Discharge: 2018-06-01 | Disposition: A | Discharge status: Home / Self Care | Payer: 59 | Attending: Emergency Medicine | Admitting: Emergency Medicine

## 2018-06-01 DIAGNOSIS — G5701 Lesion of sciatic nerve, right lower limb: Secondary | ICD-10-CM | POA: Insufficient documentation

## 2018-06-01 DIAGNOSIS — Z87891 Personal history of nicotine dependence: Secondary | ICD-10-CM | POA: Diagnosis not present

## 2018-06-01 DIAGNOSIS — I10 Essential (primary) hypertension: Secondary | ICD-10-CM | POA: Diagnosis not present

## 2018-06-01 DIAGNOSIS — J449 Chronic obstructive pulmonary disease, unspecified: Secondary | ICD-10-CM | POA: Insufficient documentation

## 2018-06-01 DIAGNOSIS — Z79899 Other long term (current) drug therapy: Secondary | ICD-10-CM | POA: Diagnosis not present

## 2018-06-01 DIAGNOSIS — M25551 Pain in right hip: Secondary | ICD-10-CM | POA: Diagnosis not present

## 2018-06-01 MED ORDER — HYDROCODONE-ACETAMINOPHEN 5-325 MG PO TABS: 1.0000 | ORAL_TABLET | Freq: Four times a day (QID) | ORAL | 0 refills | Status: DC | PRN

## 2018-06-01 MED ORDER — ONDANSETRON 4 MG PO TBDP
4.0000 mg | ORAL_TABLET | Freq: Once | ORAL | Status: AC
  Administered 2018-06-01: 4 mg via ORAL
  Filled 2018-06-01: qty 1

## 2018-06-01 MED ORDER — KETOROLAC TROMETHAMINE 30 MG/ML IJ SOLN
30.0000 mg | Freq: Once | INTRAMUSCULAR | Status: AC
  Administered 2018-06-01: 30 mg via INTRAMUSCULAR
  Filled 2018-06-01: qty 1

## 2018-06-01 MED ORDER — NAPROXEN 500 MG PO TABS: 500.0000 mg | ORAL_TABLET | Freq: Two times a day (BID) | ORAL | 0 refills | Status: DC

## 2018-06-01 MED ORDER — OXYCODONE-ACETAMINOPHEN 5-325 MG PO TABS
1.0000 | ORAL_TABLET | Freq: Once | ORAL | Status: AC
  Administered 2018-06-01: 1 via ORAL
  Filled 2018-06-01: qty 1

## 2018-06-01 NOTE — Discharge Instructions (Addendum)
You were seen today for right hip pain.  I suspect he may have some nerve impingement in your right buttock.  This is called piriformis syndrome.  You can do some stretching.  Sometimes you need physical therapy if pain persists.

## 2018-06-01 NOTE — ED Triage Notes (Signed)
Pt has had hip pain. Has seen PCP and received gabapentin.  Has been hurting x 3 weeks. Unable to sleep d/t pain. Pt was working this and experienced excruciating pain, diaphoresis, and nausea. There are no positions of comfort.

## 2018-06-01 NOTE — ED Notes (Signed)
Pt taken to xray 

## 2018-06-01 NOTE — ED Provider Notes (Signed)
[facility]ENC[facility] DEPARTMENT Provider Note   CSN: [id number] Arrival date & time: 06/01/18  0341     History   Chief Complaint Chief Complaint  Patient presents with  . Hip Pain    HPI [name] is a 52 y.o. female.  HPI  This is a 52 year old female with a history of hypertension, hyperlipidemia, sleep apnea who presents with right hip pain.  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  Patient reports 3-week history of worsening right hip pain.  She was seen and evaluated at [facility].  At that time she described more bilateral pain.  Ultrasounds were negative.  Thought it may be related to her cholesterol medication.  This was altered.  However, pain now localizes more to the right.  She describes pain in her right hip and buttock that radiates down her legs.  She describes it is achy.  Currently it is 8 out of 10.  She has taken naproxen.  She was also given gabapentin by her primary physician with minimal improvement.  It is worse with sitting.  She denies any numbness or tingling of the leg.  She denies any bowel or bladder difficulties.  No fevers or infectious symptoms.  Past Medical History:  Diagnosis Date  . Allergy   . Anemia   . Anxiety   . Arthritis    all over  . Asthma   . COPD (chronic obstructive pulmonary disease) ([place])   . Depression   . Dysrhythmia    Irregular heat w/ asthma episodes and anxiety  . GERD (gastroesophageal reflux disease)   . Hyperlipidemia   . Hypertension   . Migraines   . PONV (postoperative nausea and vomiting)   . Pre-diabetes   . Sleep apnea    uses CPAP  . Urinary incontinence    frequent urination- tx w/ vesicare  . Urinary urgency     Patient Active Problem List   Diagnosis Date Noted  . Muscle ache 05/26/2018  . Weight gain 05/26/2018  . Vaginal irritation 03/14/2018  . Restless leg syndrome 09/05/2017  . Chronic female pelvic pain 08/15/2017  . S/P abdominal  supracervical hysterectomy, bilateral salpingectomy, left oophorectomy 08/15/2017  . Chronic migraine without aura with status migrainosus, not intractable 07/26/2017  . Sinusitis 02/25/2017  . Acute bacterial sinusitis 11/22/2016  . Left knee pain 08/29/2016  . Bunion of great toe of right foot 08/29/2016  . Healthcare maintenance 08/01/2016  . Abdominal cramping 12/30/2015  . Bladder spasms 12/30/2015  . Pre-diabetes 10/03/2015  . HTN (hypertension) 10/03/2015  . Fatigue 07/21/2015  . Urinary urgency 07/06/2015  . Flank pain 07/06/2015  . Hot flashes 10/14/2014  . Vitamin D deficiency 11/25/2013  . Cough 09/10/2013  . Lower extremity edema 07/20/2013  . Back pain 06/30/2013  . Migraine 06/07/2013  . Knee pain, bilateral 10/09/2012  . Obstructive sleep apnea 09/08/2010  . HLD (hyperlipidemia) 09/20/2008  . Depression with anxiety 12/26/2006  . RHINITIS, ALLERGIC 12/26/2006  . Asthma 12/26/2006    Past Surgical History:  Procedure Laterality Date  . ABDOMINAL HYSTERECTOMY Bilateral 08/15/2017   Procedure: SUPRACERVICAL HYSTERECTOMY ABDOMINAL W/ BILATERAL SALPINGECTOMY AND LEFT OOPHORECTOMY;  Surgeon: Osborne Oman, MD;  Location: [facility];  Service: Gynecology;  Laterality: Bilateral;  . ABLATION    . CESAREAN SECTION     x3  . DILATION AND CURETTAGE OF UTERUS  05/31/2005   MAB, TAB x 2  . HERNIA REPAIR  1990's  . HYSTEROSCOPY W/ ENDOMETRIAL ABLATION    . LEEP    . MAB     x 4  . MOUTH SURGERY     teeth extraction  . TUBAL LIGATION  11/01/2006  . WISDOM TOOTH EXTRACTION       OB History    Gravida  15   Para  8   Term  8   Preterm  0   AB  7   Living  4     SAB  2   TAB  1   Ectopic      Multiple      Live Births               Home Medications    Prior to Admission medications   Medication Sig Start Date End Date Taking? Authorizing Provider  acetaminophen-codeine (TYLENOL #3) 300-30 MG tablet Take 1 tablet by mouth every 6 (six) hours  as needed for moderate pain. 05/12/18   Glyn Ade, PA-C  AJOVY 225 MG/1.5ML SOSY INJECT EVERY [place] SKIN 09/26/17   [provider]  albuterol (PROVENTIL) (2.5 MG/3ML) 0.083% nebulizer solution Take 3 mLs (2.5 mg total) every 4 (four) hours as needed by nebulization. 09/05/17   Caroline More, DO  albuterol (VENTOLIN HFA) 108 (90 Base) MCG/ACT inhaler INHALE 2 PUFFS BY MOUTH EVERY 3-4 HOURS AS NEEDED FOR SHORTNESS OF BREATH\WHEEZING\RECURRENT COUGH 12/11/17   Laverle Hobby, MD  baclofen (LIORESAL) 10 MG tablet Take 1 tablet (10 mg total) by mouth 3 (three) times daily as needed (headaches). Patient not taking: Reported on 04/23/2018 02/17/16   Olin Hauser, DO  BIOTIN PO Take 2,000 mg by mouth daily.    [provider]  cetirizine (ZYRTEC) 10 MG tablet Take 1 tablet (10 mg total) daily by mouth. 09/05/17   Caroline More, DO  diclofenac (VOLTAREN) 75 MG EC tablet Take 1 tablet (75 mg total) by mouth 2 (two) times daily with a meal. 05/28/17   Stinson, Tanna Savoy, DO  EEMT HS 0.625-1.25 MG tablet TAKE 1 TABLET BY MOUTH ONCE DAILY 04/11/18   Anyanwu, Sallyanne Havers, MD  eletriptan (RELPAX) 40 MG tablet Take 1 tablet (40 mg total) by mouth as needed for migraine or headache. May repeat in 2 hours if headache persists or recurs. 07/26/17   Melvenia Beam, MD  escitalopram (LEXAPRO) 20 MG tablet TAKE 1 TABLET BY MOUTH ONCE DAILY 04/10/18   Caroline More, DO  esomeprazole (NEXIUM) 40 MG capsule TAKE 1 CAPSULE BY MOUTH ONCE DAILY 03/06/18   Caroline More, DO  fluticasone (FLONASE) 50 MCG/ACT nasal spray PLACE 2 SPRAYS INTO THE NOSE DAILY. 09/05/17   Caroline More, DO  furosemide (LASIX) 20 MG tablet Take 1 tablet (20 mg total) by mouth daily. 02/27/18   Rogue Bussing, MD  gabapentin (NEURONTIN) 300 MG capsule Take 1 capsule (300 mg total) by mouth every 8 (eight) hours as needed (PAIN). Can take 600 mg at night prn hot flashes 05/23/18   Caroline More,  DO  HYDROcodone-acetaminophen (NORCO/VICODIN) 5-325 MG tablet Take 1 tablet by mouth every 6 (six) hours as needed. 06/01/18   Mattis Featherly, Barbette Hair, MD  losartan (COZAAR) 50 MG tablet TAKE 1 TABLET BY MOUTH ONCE DAILY 04/10/18   Caroline More, DO  meloxicam (MOBIC) 15 MG tablet Take 1 tablet (15 mg total) by mouth daily. 10/16/17   Palumbo, April, MD  methocarbamol (ROBAXIN) 500 MG tablet Take 1 tablet (500 mg total) by  mouth 2 (two) times daily. 10/16/17   Palumbo, April, MD  mometasone-formoterol (DULERA) 200-5 MCG/ACT AERO Inhale 2 puffs into the lungs 2 (two) times daily. 10/04/17   Laverle Hobby, MD  montelukast (SINGULAIR) 10 MG tablet Take 1 tablet (10 mg total) by mouth at bedtime. 12/30/15   Virginia Crews, MD  Multiple Vitamin (MULTIVITAMIN WITH MINERALS) TABS tablet Take 1 tablet by mouth daily.    [provider]  naproxen (NAPROSYN) 500 MG tablet Take 1 tablet (500 mg total) by mouth 2 (two) times daily as needed for mild pain or moderate pain. 12/11/17   Carlyle Dolly, MD  naproxen (NAPROSYN) 500 MG tablet Take 1 tablet (500 mg total) by mouth 2 (two) times daily. 06/01/18   Maynard David, Barbette Hair, MD  NONFORMULARY OR COMPOUNDED ITEM Inject 1 application every 30 (thirty) days into the skin. 09/10/17   Melvenia Beam, MD  ondansetron (ZOFRAN) 4 MG tablet Take 1 tablet (4 mg total) by mouth every 8 (eight) hours as needed for nausea or vomiting. 05/29/17   Rasch, Anderson Malta I, NP  oxybutynin (DITROPAN-XL) 10 MG 24 hr tablet TAKE 1 TABLET BY MOUTH AT BEDTIME 04/09/18   Caroline More, DO  pravastatin (PRAVACHOL) 40 MG tablet Take 1 tablet (40 mg total) by mouth daily. If muscle pain may take every other day. 05/28/18   Caroline More, DO  Pyridoxine HCl (VITAMIN B-6 PO) Take 1 tablet by mouth daily. Unknown OTC strength    [provider]  simvastatin (ZOCOR) 40 MG tablet Take 1 tablet (40 mg total) by mouth at bedtime. 04/11/18   Caroline More, DO    Spacer/Aero-Holding Chambers (AEROCHAMBER MV) inhaler Use as instructed 05/09/15   Vilinda Boehringer, MD  SPIRIVA RESPIMAT 2.5 MCG/ACT AERS INHALE 2 PUFFS BY MOUTH DAILY. 12/13/17   Caroline More, DO  SUMAtriptan (IMITREX) 50 MG tablet Take 50 mg by mouth every 2 (two) hours as needed for migraine. May repeat in 2 hours if headache persists or recurs.    [provider]  traMADol (ULTRAM) 50 MG tablet Take 2 tablets (100 mg total) by mouth every 6 (six) hours as needed. Patient taking differently: Take 100 mg by mouth every 6 (six) hours as needed for moderate pain.  06/06/17   Anyanwu, Sallyanne Havers, MD  vitamin C (ASCORBIC ACID) 500 MG tablet Take 500 mg by mouth daily.     [provider]  medroxyPROGESTERone (DEPO-PROVERA) 150 MG/ML injection Inject 1 mL (150 mg total) into the muscle every 3 (three) months. 08/23/11 01/12/12  Lyndee Hensen, MD  medroxyPROGESTERone (PROVERA) 10 MG tablet Take 2 tablets (20 mg total) by mouth daily. 11/27/11 01/12/12  Osborne Oman, MD    Family History Family History  Problem Relation Age of Onset  . Diabetes Maternal Grandmother   . Colon cancer Maternal Grandfather   . Anesthesia problems Neg Hx   . Headache Neg Hx   . Aneurysm Neg Hx   . Esophageal cancer Neg Hx   . Rectal cancer Neg Hx   . Stomach cancer Neg Hx     Social History Social History   Tobacco Use  . Smoking status: Former Smoker    Packs/day: 0.25    Years: 15.00    Pack years: 3.75    Types: Cigarettes    Last attempt to quit: 02/16/2003    Years since quitting: 15.2  . Smokeless tobacco: Never Used  Substance Use Topics  . Alcohol use: Yes  Comment: socially  . Drug use: No     Allergies   Amitriptyline; Fluticasone-salmeterol; Penicillins; Ciprofloxacin; Macrobid [nitrofurantoin monohyd macro]; and Sulfamethoxazole-trimethoprim   Review of Systems Review of Systems  Constitutional: Negative for fever.  Musculoskeletal: Negative for back pain.        Right hip and buttock pain  Neurological: Negative for weakness and numbness.  All other systems reviewed and are negative.    Physical Exam Updated Vital Signs BP 117/76   Pulse 72   Resp 18   LMP 05/21/2017 (Within Weeks)   SpO2 100%   Physical Exam  Constitutional: She is oriented to person, place, and time. She appears well-developed and well-nourished. No distress.  HENT:  Head: Normocephalic and atraumatic.  Cardiovascular: Normal rate and regular rhythm.  Pulmonary/Chest: Effort normal. No respiratory distress.  Abdominal: Soft. She exhibits no distension.  Musculoskeletal:  Tenderness palpation right gluteal region over the piriformis, also right lateral hip tenderness, no overlying skin changes, positive right straight leg raise  Neurological: She is alert and oriented to person, place, and time.  5 Out of 5 strength bilateral lower extremities  Skin: Skin is warm and dry.  Psychiatric: She has a normal mood and affect.  Nursing note and vitals reviewed.    ED Treatments / Results  Labs (all labs ordered are listed, but only abnormal results are displayed) Labs Reviewed - No data to display  EKG None  Radiology Dg Hip Unilat W Or Wo Pelvis 2-3 Views Right  Result Date: 06/01/2018 CLINICAL DATA:  52 y/o  F; hip and buttocks pain. EXAM: DG HIP (WITH OR WITHOUT PELVIS) 2-3V RIGHT COMPARISON:  None. FINDINGS: There is no evidence of hip fracture or dislocation. There is no evidence of arthropathy or other focal bone abnormality. IMPRESSION: Negative. Electronically Signed   By: Kristine Garbe M.D.   On: 06/01/2018 05:09    Procedures Procedures (including critical care time)  Medications Ordered in ED Medications  ketorolac (TORADOL) 30 MG/ML injection 30 mg (30 mg Intramuscular Given 06/01/18 0444)  oxyCODONE-acetaminophen (PERCOCET/ROXICET) 5-325 MG per tablet 1 tablet (1 tablet Oral Given 06/01/18 0445)  ondansetron (ZOFRAN-ODT) disintegrating tablet  4 mg (4 mg Oral Given 06/01/18 0444)     Initial Impression / Assessment and Plan / ED Course  I have reviewed the triage vital signs and the nursing notes.  Pertinent labs & imaging results that were available during my care of the patient were reviewed by me and considered in my medical decision making (see chart for details).     Patient presents with right hip and right buttock pain.  She describes symptoms that correlate with sciatica.  She is overall nontoxic-appearing.  No signs or symptoms of cauda equina.  Positive straight leg raise.  Suspect she may have piriformis syndrome.  Patient was given Toradol and Percocet.  She reports improvement after pain management.  X-rays are negative for acute fracture or degenerative changes.  Recommend sports medicine follow-up and physical therapy.  Patient was instructed regarding stretching exercises in the meantime.  After history, exam, and medical workup I feel the patient has been appropriately medically screened and is safe for discharge home. Pertinent diagnoses were discussed with the patient. Patient was given return precautions.   Final Clinical Impressions(s) / ED Diagnoses   Final diagnoses:  Right hip pain  Piriformis syndrome of right side    ED Discharge Orders        Ordered    naproxen (NAPROSYN) 500 MG tablet  2 times daily     06/01/18 0605    HYDROcodone-acetaminophen (NORCO/VICODIN) 5-325 MG tablet  Every 6 hours PRN     06/01/18 0605       Merryl Hacker, MD 06/01/18 [phone]

## 2018-06-11 ENCOUNTER — Telehealth: Payer: Self-pay | Admitting: *Deleted

## 2018-06-11 NOTE — Telephone Encounter (Signed)
Pt [employer] form on [employer].

## 2018-06-12 DIAGNOSIS — Z0289 Encounter for other administrative examinations: Secondary | ICD-10-CM

## 2018-06-17 NOTE — Telephone Encounter (Signed)
[employer] FMLA forms completed, signed and sent to medical records for processing.

## 2018-06-18 NOTE — Telephone Encounter (Addendum)
FMLA forms faxed to [employer] at [phone].

## 2018-06-19 ENCOUNTER — Telehealth: Payer: Self-pay | Admitting: Internal Medicine

## 2018-06-19 ENCOUNTER — Other Ambulatory Visit: Payer: Self-pay | Admitting: Family Medicine

## 2018-06-19 MED ORDER — TIOTROPIUM BROMIDE MONOHYDRATE 2.5 MCG/ACT IN AERS: 2.0000 | INHALATION_SPRAY | Freq: Every day | RESPIRATORY_TRACT | 2 refills | Status: DC

## 2018-06-19 MED FILL — GABAPENTIN 300 MG CAPSULE: 300 | 22 days supply | Qty: 90 | Fill #1

## 2018-06-19 MED FILL — ESOMEPRAZOLE MAG DR 40 MG C: 40 | 30 days supply | Qty: 30 | Fill #3

## 2018-06-19 MED FILL — SPIRIVA RESPIMAT INHAL SPRY: 2.5 | 30 days supply | Qty: 4 | Fill #0

## 2018-06-19 MED FILL — ESCITALOPRAM 20 MG TABLET: 20 | 30 days supply | Qty: 30 | Fill #2

## 2018-06-19 MED FILL — LOSARTAN POTASSIUM 50 MG TA: 50 | 30 days supply | Qty: 30 | Fill #0

## 2018-06-19 MED FILL — FUROSEMIDE 20 MG TABS: 20 | 30 days supply | Qty: 30 | Fill #3

## 2018-06-19 MED FILL — AJOVY 225 MG/1.5ML SOSY: 225 | 30 days supply | Qty: 2 | Fill #7

## 2018-06-19 MED FILL — VENTOLIN HFA 90 MCG INHALER: 108 (90 BAS | 17 days supply | Qty: 18 | Fill #5

## 2018-06-19 MED FILL — DULERA 200 MCG/5 MCG INH: 200-5 | 30 days supply | Qty: 13 | Fill #5

## 2018-06-19 NOTE — Telephone Encounter (Signed)
Sent to pharmacy 

## 2018-06-19 NOTE — Telephone Encounter (Signed)
°*  STAT* If patient is at the pharmacy, call can be transferred to refill team.   1. Which medications need to be refilled? (please list name of each medication and dose if known) Spiriva   2. Which pharmacy/location (including street and city if local pharmacy) is medication to be sent to? [facility] in [place]   3. Do they need a 30 day or 90 day supply? 90 day

## 2018-06-23 DIAGNOSIS — G4733 Obstructive sleep apnea (adult) (pediatric): Secondary | ICD-10-CM | POA: Diagnosis not present

## 2018-07-01 ENCOUNTER — Ambulatory Visit (INDEPENDENT_AMBULATORY_CARE_PROVIDER_SITE_OTHER): Payer: 59 | Admitting: Family Medicine

## 2018-07-01 DIAGNOSIS — M545 Low back pain, unspecified: Secondary | ICD-10-CM

## 2018-07-01 MED ORDER — METHOCARBAMOL 500 MG PO TABS: 500.0000 mg | ORAL_TABLET | Freq: Two times a day (BID) | ORAL | 0 refills | Status: DC

## 2018-07-01 MED ORDER — FLUTICASONE PROPIONATE 50 MCG/ACT NA SUSP: NASAL | 6 refills | Status: DC

## 2018-07-01 MED ORDER — NAPROXEN 500 MG PO TABS: 500.0000 mg | ORAL_TABLET | Freq: Two times a day (BID) | ORAL | 0 refills | Status: DC

## 2018-07-01 MED FILL — NAPROXEN 500 MG TABLET: 500 | 15 days supply | Qty: 30 | Fill #0

## 2018-07-01 MED FILL — METHOCARBAMOL 500 MG TABS: 500 | 10 days supply | Qty: 20 | Fill #0

## 2018-07-01 MED FILL — FLUTICASONE PROP 50 MCG SPR: 50 | 30 days supply | Qty: 16 | Fill #0

## 2018-07-01 NOTE — Assessment & Plan Note (Addendum)
Tenderness to palpation of right hip. Full ROM able but with pain. Normal gait. No red flags present at this time. Hip Xray from 06/01/18 showing no evidence of hip fracture or dislocation or athropathy. Possibly MSK related. Will plan to refill patient's RX for robaxin to help with MSK pain. Advised patient to continue naproxen PRN and gabapentin bid. Can alternate tylenol for naproxen. If pain is not improved with robaxin can consider increasing gabapentin to tid dosing with referral to PT.  -follow up in 1 month if no improvement

## 2018-07-01 NOTE — Assessment & Plan Note (Signed)
Sinus drainage likely 2/2 allergic rhinitis. Advised patient to continue zyrtec daily. Will refill flonase and advised to use daily. If no improvement advised patient to follow up.

## 2018-07-01 NOTE — Patient Instructions (Signed)
Back Pain, Adult Back pain is very common. The pain often gets better over time. The cause of back pain is usually not dangerous. Most people can learn to manage their back pain on their own. Follow these instructions at home: Watch your back pain for any changes. The following actions may help to lessen any pain you are feeling:  Stay active. Start with short walks on flat ground if you can. Try to walk farther each day.  Exercise regularly as told by your doctor. Exercise helps your back heal faster. It also helps avoid future injury by keeping your muscles strong and flexible.  Do not sit, drive, or stand in one place for more than 30 minutes.  Do not stay in bed. Resting more than 1-2 days can slow down your recovery.  Be careful when you bend or lift an object. Use good form when lifting: ? Bend at your knees. ? Keep the object close to your body. ? Do not twist.  Sleep on a firm mattress. Lie on your side, and bend your knees. If you lie on your back, put a pillow under your knees.  Take medicines only as told by your doctor.  Put ice on the injured area. ? Put ice in a plastic bag. ? Place a towel between your skin and the bag. ? Leave the ice on for 20 minutes, 2-3 times a day for the first 2-3 days. After that, you can switch between ice and heat packs.  Avoid feeling anxious or stressed. Find good ways to deal with stress, such as exercise.  Maintain a healthy weight. Extra weight puts stress on your back.  Contact a doctor if:  You have pain that does not go away with rest or medicine.  You have worsening pain that goes down into your legs or buttocks.  You have pain that does not get better in one week.  You have pain at night.  You lose weight.  You have a fever or chills. Get help right away if:  You cannot control when you poop (bowel movement) or pee (urinate).  Your arms or legs feel weak.  Your arms or legs lose feeling (numbness).  You feel sick  to your stomach (nauseous) or throw up (vomit).  You have belly (abdominal) pain.  You feel like you may pass out (faint). This information is not intended to replace advice given to you by your health care provider. Make sure you discuss any questions you have with your health care provider. Document Released: 04/02/2008 Document Revised: 03/22/2016 Document Reviewed: 02/16/2014 Elsevier Interactive Patient Education  [employer]. It was a pleasure seeing you today.   Today we discussed your back pain and sinuses  For your back pain: please use robaxin as needed for back pain. This will allow you to exercise more as well as pain will be better controlled. If this still does not help give me a call, we could try and increase your gabapentin if needed. We can consider physical therapy in the future if needed.   For your sinuses: I have refilled your flonase. This should help. If not please follow up.   Please follow up in 1 month if no improvement or sooner if symptoms persist or worsen. Please call the clinic immediately if you have any concerns.   Our clinic's number is [phone]. Please call with questions or concerns.    Thank you,  Caroline More, DO

## 2018-07-01 NOTE — Progress Notes (Signed)
Subjective:    Patient ID: [name], female    DOB: 03-31-1966, 52 y.o.   MRN: [id number]   CC: back pain and sinus congestion   HPI: Back pain Patient presenting today for increased back pain.  Patient states that it is worse at night.  Says it is in her lower right back and radiates down her right leg.  Says her pain that radiates down her leg is more of a numbness tingling feeling that goes to her toes.  Denies any urinary or bowel incontinence.  No fever/chills. Patient has had this back pain for 1 month has been followed up in ED.  In the ED patient had x-rays which were normal.  Patient reports pain that is worse when getting up from a laying down position.  Patient has had normal gait but sometimes due to increased pain notices worsening gait.  Patient has tried leg exercises but this makes it worse.  Patient recently had to move her parking spot due to possible construction which causes her increased pain due to increased walking.  Patient has been trying to take naproxen and gabapentin twice a day to help with pain.  Patient at one point took Vicodin but does not like to take narcotics for fear of constipation.  Patient has also tried Tylenol with codeine in the past but does not like to use codeine.  Sinus congestion Patient reports sinus drainage and congestion x1 week.  Says that draining gets so bad at night that she tends to cough at night.  Says that the sinus congestion started when there is increased rain last week.  Says she is you to has to use her inhaler twice a day due to increased postnasal drip.  Patient states that due to sinus drainage she is having difficulty breathing especially when she is walking from a very far parking spot to the hospital.  Patient has tried taking Zyrtec daily.  Patient used to use Flonase in the past which helped but has been out of her refill for some time now.  Objective:  BP 127/80 (BP Location: Left Arm, Patient Position: Sitting, Cuff Size:  Large)   Pulse 68   Temp 98.5 F (36.9 C) (Oral)   Wt 200 lb (90.7 kg)   LMP 05/21/2017 (Within Weeks)   SpO2 98%   BMI 37.79 kg/m  Vitals and nursing note reviewed  General: well nourished, in no acute distress HEENT: normocephalic, TM's visualized bilaterally, no scleral icterus or conjunctival pallor, nasal edema noted, moist mucous membranes, good dentition without erythema or discharge noted in posterior oropharynx, post nasal drop noted  Neck: supple, non-tender, without lymphadenopathy Cardiac: RRR, clear S1 and S2, no murmurs, rubs, or gallops Respiratory: clear to auscultation bilaterally, no increased work of breathing Abdomen: soft, nontender, nondistended, no masses or organomegaly. Bowel sounds present Extremities: no edema or cyanosis. Warm, well perfused. 2+ radial and PT pulses bilaterally MSK: tenderness to palpation of right hip and lower back musculature from L5 down to sacrum, pain with flexion and external/internal rotation of hip. Normal gait  Skin: warm and dry, no rashes noted Neuro: alert and oriented, no focal deficits   Assessment & Plan:    Back pain Tenderness to palpation of right hip. Full ROM able but with pain. Normal gait. No red flags present at this time. Hip Xray from 06/01/18 showing no evidence of hip fracture or dislocation or athropathy. Possibly MSK related. Will plan to refill patient's RX for robaxin to help  with MSK pain. Advised patient to continue naproxen PRN and gabapentin bid. Can alternate tylenol for naproxen. If pain is not improved with robaxin can consider increasing gabapentin to tid dosing with referral to PT.  -follow up in 1 month if no improvement   RHINITIS, ALLERGIC Sinus drainage likely 2/2 allergic rhinitis. Advised patient to continue zyrtec daily. Will refill flonase and advised to use daily. If no improvement advised patient to follow up.     Return in about 4 weeks (around 07/29/2018), or if symptoms worsen or fail to  improve.   Caroline More, DO, PGY-2

## 2018-07-09 MED FILL — FLUCONAZOLE 150 MG TABS: 150 | 5 days supply | Qty: 5 | Fill #0

## 2018-07-28 ENCOUNTER — Telehealth: Payer: Self-pay | Admitting: Family Medicine

## 2018-07-28 NOTE — Telephone Encounter (Signed)
Received letter from patient's health insurance discussing possibility of patient attending pulmonary rehabilitation program given her diagnosis of COPD.  Per insurance if we will likely benefit patient.  I also agree.  However, patient COPD is being managed by pulmonology.  Discussed this with patient and she states that she would rather discuss this further with her pulmonologist at her appointment next week on August 06, 2018.  Encouraged patient to follow this up with pulmonology.  Dalphine Handing, PGY-2 [facility] 07/28/2018 5:43 PM

## 2018-07-29 ENCOUNTER — Other Ambulatory Visit: Payer: Self-pay | Admitting: Family Medicine

## 2018-07-29 ENCOUNTER — Other Ambulatory Visit: Payer: Self-pay | Admitting: Internal Medicine

## 2018-07-29 MED FILL — NAPROXEN 500 MG TABLET: 500 | 30 days supply | Qty: 60 | Fill #0

## 2018-07-29 MED FILL — AJOVY 225 MG/1.5ML SOSY: 225 | 30 days supply | Qty: 2 | Fill #8

## 2018-07-29 MED FILL — SPIRIVA RESPIMAT INHAL SPRY: 2.5 | 30 days supply | Qty: 4 | Fill #1

## 2018-07-29 MED FILL — ESOMEPRAZOLE MAG DR 40 MG C: 40 | 30 days supply | Qty: 30 | Fill #4

## 2018-07-29 MED FILL — GABAPENTIN 300 MG CAPSULE: 300 | 22 days supply | Qty: 90 | Fill #2

## 2018-07-29 MED FILL — DULERA 200 MCG/5 MCG INH: 200-5 | 30 days supply | Qty: 13 | Fill #0

## 2018-07-29 MED FILL — LOSARTAN POTASSIUM 50 MG TA: 50 | 30 days supply | Qty: 30 | Fill #1

## 2018-07-29 MED FILL — ESCITALOPRAM 20 MG TABLET: 20 | 30 days supply | Qty: 30 | Fill #3

## 2018-07-29 MED FILL — VENTOLIN HFA 90 MCG INHALER: 108 (90 BAS | 17 days supply | Qty: 18 | Fill #6

## 2018-07-30 MED FILL — OXYBUTYNIN CL ER 10 MG TAB: 10 | 30 days supply | Qty: 30 | Fill #0

## 2018-08-13 ENCOUNTER — Ambulatory Visit: Payer: 59 | Admitting: Internal Medicine

## 2018-08-25 ENCOUNTER — Telehealth: Payer: Self-pay | Admitting: Obstetrics & Gynecology

## 2018-08-25 NOTE — Telephone Encounter (Addendum)
Called pt and discussed her concern.  She stated that she has been out of her hormone medicine for 2 months. She has been having hot flashes which then make her nauseous. She stated that her pharmacy told her the medication refill has been requested several times yet they have no had a response. I advised pt that I will send a message to Dr. Harolyn Rutherford. Pt voiced understanding.   10/30  1349  Rx refill e-prescribed by Dr. Harolyn Rutherford. Called pt and left message on her personal voice mail. She may call back if she has additional questions.

## 2018-08-25 NOTE — Telephone Encounter (Signed)
Patient called about not having her medication. She stated the Pharmacy has requested her refill, but she has not gotten it as of yet. Feeling sick to the stomach, and she needs her medication ASAP.

## 2018-08-26 NOTE — Progress Notes (Signed)
* [facility]     Assessment and Plan:  Obstructive sleep apnea. -Poor compliance with continued daytime sleepiness. - Reiterated the importance of nightly use of CPAP for the entire night.   Chief workers sleep disorder/circadian rhythm disorder. -Patient works an overnight shift, this may be contributing to her daytime sleepiness. -Recommend continued use of daily CPAP.  Excessive daytime sleepiness.  -Multifactorial from sedating medication, OSA, shift work disorder.   Asthma.  --Continue daily symptoms, with dyspnea on exertion. --Advised to use Dulera 2 puffs twice daily, Spiriva once daily.  She may use her albuterol inhaler as needed.  Return in about 1 year (around 08/30/2019).   Date: 08/26/2018  MRN# [id number] [name] 1966-10-14   [name] is a 52 y.o. old female seen in follow up for chief complaint of  No chief complaint on file.    HPI:   The patient is a 52 yo female with asthma, mild OSA, on CPAP 5-20 cm H2O, also with shift workers disorder. At last visit she was not using CPAP regularly. At last visit she was advised to Continue dulera, spiriva, singulair, zyrtec, nebs, albuterol mdi and use cpap regularly.   Today she notes that she has continued dyspnea when walking from the parking lot to work. She takes 2 puffs of dulera and takes spiriva. She uses albuterol as needed. She speaks with her eyes closed for most of the appointment.  She has mild reflux which is controlled. She has sinus drainage treated with nasal spray.    **Download data 07/22/2016-08/20/2016>> raw data personally reviewed.  Usage greater than 4 hours is 13/30 days.  Average usage on days used is 4 hours 20 minutes.  Pressure ranges 5-20 on auto CPAP.  Median pressure is 8, 95th percentile pressure is 10, maximum pressure is 12.  Residual AHI 0.7.  Overall this shows poor compliance with excellent control of obstructive sleep apnea when used. **Download data  personally reviewed, 3 days as of 08/20/2016.  Usage greater than 4 hours is 13/30 days.  Average usage on days used is 4 hours 20 minutes.  Pressure ranges 5-20.  Median pressure 7, 95th percentile pressure is 10, maximum pressure is 11.  Residual AHI is 0.7.  While this test shows suboptimal use of CPAP with excellent control of obstructive sleep apnea when used. **Download data 9/24-10/23/17:23/30 days; greater than 4 hours equals 13 days. 43%. Average usage is 4 hours 20 minutes. Onset 5-20; 95th percentile pressure is 10, residual AHI 0.7.  She takes nexium once in am. She used to get severe "strangling" if she eats close to bed time which improved when she stopped eating close to bedtime.   They have a dog but not in her bedroom.    Medication:   Outpatient Encounter Medications as of 08/29/2018  Medication Sig  . acetaminophen-codeine (TYLENOL #3) 300-30 MG tablet Take 1 tablet by mouth every 6 (six) hours as needed for moderate pain.  Marland Kitchen AJOVY 225 MG/1.5ML SOSY INJECT EVERY 30 DAY INTO THE SKIN  . albuterol (PROVENTIL) (2.5 MG/3ML) 0.083% nebulizer solution Take 3 mLs (2.5 mg total) every 4 (four) hours as needed by nebulization.  Marland Kitchen albuterol (VENTOLIN HFA) 108 (90 Base) MCG/ACT inhaler INHALE 2 PUFFS BY MOUTH EVERY 3-4 HOURS AS NEEDED FOR SHORTNESS OF BREATH\WHEEZING\RECURRENT COUGH  . baclofen (LIORESAL) 10 MG tablet Take 1 tablet (10 mg total) by mouth 3 (three) times daily as needed (headaches). (Patient not taking: Reported on 04/23/2018)  .  BIOTIN PO Take 2,000 mg by mouth daily.  . cetirizine (ZYRTEC) 10 MG tablet Take 1 tablet (10 mg total) daily by mouth.  . diclofenac (VOLTAREN) 75 MG EC tablet Take 1 tablet (75 mg total) by mouth 2 (two) times daily with a meal.  . DULERA 200-5 MCG/ACT AERO INHALE 2 PUFFS BY MOUTH 2 TIMES DAILY.  Marland Kitchen EEMT HS 0.625-1.25 MG tablet TAKE 1 TABLET BY MOUTH ONCE DAILY  . eletriptan (RELPAX) 40 MG tablet Take 1 tablet (40 mg total) by mouth as needed for  migraine or headache. May repeat in 2 hours if headache persists or recurs.  Marland Kitchen escitalopram (LEXAPRO) 20 MG tablet TAKE 1 TABLET BY MOUTH ONCE DAILY  . esomeprazole (NEXIUM) 40 MG capsule TAKE 1 CAPSULE BY MOUTH ONCE DAILY  . fluticasone (FLONASE) 50 MCG/ACT nasal spray PLACE 2 SPRAYS INTO THE NOSE DAILY.  . furosemide (LASIX) 20 MG tablet Take 1 tablet (20 mg total) by mouth daily.  Marland Kitchen gabapentin (NEURONTIN) 300 MG capsule Take 1 capsule (300 mg total) by mouth every 8 (eight) hours as needed (PAIN). Can take 600 mg at night prn hot flashes  . HYDROcodone-acetaminophen (NORCO/VICODIN) 5-325 MG tablet Take 1 tablet by mouth every 6 (six) hours as needed.  Marland Kitchen losartan (COZAAR) 50 MG tablet TAKE 1 TABLET BY MOUTH ONCE DAILY  . meloxicam (MOBIC) 15 MG tablet Take 1 tablet (15 mg total) by mouth daily.  . methocarbamol (ROBAXIN) 500 MG tablet Take 1 tablet (500 mg total) by mouth 2 (two) times daily.  . montelukast (SINGULAIR) 10 MG tablet Take 1 tablet (10 mg total) by mouth at bedtime.  . Multiple Vitamin (MULTIVITAMIN WITH MINERALS) TABS tablet Take 1 tablet by mouth daily.  . naproxen (NAPROSYN) 500 MG tablet Take 1 tablet (500 mg total) by mouth 2 (two) times daily.  . NONFORMULARY OR COMPOUNDED ITEM Inject 1 application every 30 (thirty) days into the skin.  Marland Kitchen ondansetron (ZOFRAN) 4 MG tablet Take 1 tablet (4 mg total) by mouth every 8 (eight) hours as needed for nausea or vomiting.  Marland Kitchen oxybutynin (DITROPAN-XL) 10 MG 24 hr tablet TAKE 1 TABLET BY MOUTH AT BEDTIME  . pravastatin (PRAVACHOL) 40 MG tablet Take 1 tablet (40 mg total) by mouth daily. If muscle pain may take every other day.  . Pyridoxine HCl (VITAMIN B-6 PO) Take 1 tablet by mouth daily. Unknown OTC strength  . simvastatin (ZOCOR) 40 MG tablet Take 1 tablet (40 mg total) by mouth at bedtime.  Marland Kitchen Spacer/Aero-Holding Chambers (AEROCHAMBER MV) inhaler Use as instructed  . SUMAtriptan (IMITREX) 50 MG tablet Take 50 mg by mouth every 2 (two)  hours as needed for migraine. May repeat in 2 hours if headache persists or recurs.  . Tiotropium Bromide Monohydrate (SPIRIVA RESPIMAT) 2.5 MCG/ACT AERS Inhale 2 puffs into the lungs daily.  . traMADol (ULTRAM) 50 MG tablet Take 2 tablets (100 mg total) by mouth every 6 (six) hours as needed. (Patient taking differently: Take 100 mg by mouth every 6 (six) hours as needed for moderate pain. )  . vitamin C (ASCORBIC ACID) 500 MG tablet Take 500 mg by mouth daily.   . [DISCONTINUED] medroxyPROGESTERone (DEPO-PROVERA) 150 MG/ML injection Inject 1 mL (150 mg total) into the muscle every 3 (three) months.  . [DISCONTINUED] medroxyPROGESTERone (PROVERA) 10 MG tablet Take 2 tablets (20 mg total) by mouth daily.   Facility-Administered Encounter Medications as of 08/29/2018  Medication  . 0.9 %  sodium chloride infusion  Allergies:  Amitriptyline; Fluticasone-salmeterol; Penicillins; Ciprofloxacin; Macrobid [nitrofurantoin monohyd macro]; and Sulfamethoxazole-trimethoprim  Review of Systems:  Constitutional: Feels well. Cardiovascular: Denies chest pain, exertional chest pain.  Pulmonary: Denies hemoptysis, pleuritic chest pain.   The remainder of systems were reviewed and were found to be negative other than what is documented in the HPI.    Physical Examination:   VS: BP 112/80 (BP Location: Left Arm, Cuff Size: Normal)   Pulse 88   Resp 16   Ht 5\' 1"  (1.549 m)   Wt 207 lb (93.9 kg)   LMP 05/21/2017 (Within Weeks)   SpO2 96%   BMI 39.11 kg/m   General Appearance: No distress  Neuro:without focal findings, mental status, speech normal, alert and oriented HEENT: PERRLA, EOM intact Pulmonary: No wheezing, No rales  CardiovascularNormal S1,S2.  No m/r/g.  Abdomen: Benign, Soft, non-tender, No masses Renal:  No costovertebral tenderness  GU:  No performed at this time. Endoc: No evident thyromegaly, no signs of acromegaly or Cushing features Skin:   warm, no rashes, no ecchymosis    Extremities: normal, no cyanosis, clubbing.     LABORATORY PANEL:   CBC No results for input(s): WBC, HGB, HCT, PLT in the last 168 hours. ------------------------------------------------------------------------------------------------------------------  Chemistries  No results for input(s): NA, K, CL, CO2, GLUCOSE, BUN, CREATININE, CALCIUM, MG, AST, ALT, ALKPHOS, BILITOT in the last 168 hours.  Invalid input(s): GFRCGP ------------------------------------------------------------------------------------------------------------------  Cardiac Enzymes No results for input(s): TROPONINI in the last 168 hours. ------------------------------------------------------------  RADIOLOGY:   No results found for this or any previous visit. Results for orders placed during the hospital encounter of 11/28/15  DG Chest 2 View   Narrative CLINICAL DATA:  Right-sided chest pain for 3 days no fever  EXAM: CHEST  2 VIEW  COMPARISON:  08/17/2015  FINDINGS: Cardiac silhouette upper normal in size but stable. Vascular pattern normal. Lungs clear. No effusions.  IMPRESSION: No active cardiopulmonary disease.   Electronically Signed   By: Skipper Cliche M.D.   On: 11/28/2015 12:37    ------------------------------------------------------------------------------------------------------------------  Thank  you for allowing [facility] to assist in the care of your patient. Our recommendations are noted above.  Please contact us if we can be of further service.  Marda Stalker, M.D., F.C.C.P.  Board Certified in Internal Medicine, Pulmonary Medicine, [place], and Sleep Medicine.  [facility] Office Number: [phone]   08/26/2018

## 2018-08-27 ENCOUNTER — Other Ambulatory Visit: Payer: Self-pay | Admitting: Obstetrics & Gynecology

## 2018-08-27 DIAGNOSIS — Z90711 Acquired absence of uterus with remaining cervical stump: Secondary | ICD-10-CM

## 2018-08-27 DIAGNOSIS — R232 Flushing: Secondary | ICD-10-CM

## 2018-08-27 MED ORDER — EST ESTROGENS-METHYLTEST 0.625-1.25 MG PO TABS: 1.0000 | ORAL_TABLET | Freq: Every day | ORAL | 5 refills | Status: DC

## 2018-08-27 MED FILL — ESTROGEN-METHYLTESTOS H.S.: 0.625-1.25 | 90 days supply | Qty: 90 | Fill #0

## 2018-08-29 ENCOUNTER — Ambulatory Visit: Payer: 59 | Admitting: Internal Medicine

## 2018-08-29 ENCOUNTER — Encounter: Payer: Self-pay | Admitting: Internal Medicine

## 2018-08-29 VITALS — BP 112/80 | HR 88 | Resp 16 | Ht 61.0 in | Wt 207.0 lb

## 2018-08-29 DIAGNOSIS — J45909 Unspecified asthma, uncomplicated: Secondary | ICD-10-CM | POA: Diagnosis not present

## 2018-08-29 DIAGNOSIS — J4551 Severe persistent asthma with (acute) exacerbation: Secondary | ICD-10-CM

## 2018-08-29 DIAGNOSIS — G4733 Obstructive sleep apnea (adult) (pediatric): Secondary | ICD-10-CM

## 2018-08-29 MED ORDER — TIOTROPIUM BROMIDE MONOHYDRATE 2.5 MCG/ACT IN AERS: 2.0000 | INHALATION_SPRAY | Freq: Every day | RESPIRATORY_TRACT | 10 refills | Status: DC

## 2018-08-29 MED ORDER — ALBUTEROL SULFATE HFA 108 (90 BASE) MCG/ACT IN AERS: INHALATION_SPRAY | RESPIRATORY_TRACT | 6 refills | Status: DC

## 2018-08-29 MED ORDER — MOMETASONE FURO-FORMOTEROL FUM 200-5 MCG/ACT IN AERO: INHALATION_SPRAY | RESPIRATORY_TRACT | 10 refills | Status: DC

## 2018-08-29 MED FILL — DULERA 200 MCG/5 MCG INH: 200-5 | 30 days supply | Qty: 13 | Fill #0

## 2018-08-29 MED FILL — VENTOLIN HFA 90 MCG INHALER: 108 (90 BAS | 13 days supply | Qty: 18 | Fill #0

## 2018-08-29 MED FILL — SPIRIVA RESPIMAT INHAL SPRY: 2.5 | 30 days supply | Qty: 4 | Fill #0

## 2018-08-29 NOTE — Addendum Note (Signed)
Addended by: Stephanie Coup on: 08/29/2018 10:13 AM   Modules accepted: Orders

## 2018-08-29 NOTE — Patient Instructions (Addendum)
Use dulera 2 puffs twice daily.  Use spiriva 2 puffs once daily.

## 2018-09-18 ENCOUNTER — Telehealth: Payer: Self-pay | Admitting: *Deleted

## 2018-09-18 NOTE — Telephone Encounter (Signed)
PT [employer] fmla form on [employer].

## 2018-09-22 DIAGNOSIS — Z0289 Encounter for other administrative examinations: Secondary | ICD-10-CM

## 2018-09-23 NOTE — Telephone Encounter (Signed)
FMLA forms were completed in Aug 2019. Called pt & LVM asking for call back. When she calls back, will inquire about possible duplicate forms. Did they receive the other forms and how long was the FMLA period?

## 2018-09-29 NOTE — Telephone Encounter (Signed)
Pt returned call. She stated that [employer] is requiring another one. She is in the process of trying to get in touch with them to inquire if this will need to be done every 3 months vs every 6 months. RN advised pt we can complete the forms again.

## 2018-09-30 NOTE — Telephone Encounter (Signed)
[employer] FMLA forms completed, signed & sent to medical records for processing.  

## 2018-10-02 ENCOUNTER — Telehealth: Payer: Self-pay | Admitting: *Deleted

## 2018-10-02 NOTE — Progress Notes (Signed)
FMLA/disability forms completed by MD and put in outgoing mail to return to [facility].

## 2018-10-02 NOTE — Telephone Encounter (Signed)
Pt fmla form faxed on 10/02/18

## 2018-10-23 ENCOUNTER — Encounter (HOSPITAL_BASED_OUTPATIENT_CLINIC_OR_DEPARTMENT_OTHER): Payer: Self-pay | Admitting: Emergency Medicine

## 2018-10-23 ENCOUNTER — Other Ambulatory Visit: Payer: Self-pay

## 2018-10-23 ENCOUNTER — Emergency Department (HOSPITAL_BASED_OUTPATIENT_CLINIC_OR_DEPARTMENT_OTHER)
Admission: EM | Admit: 2018-10-23 | Discharge: 2018-10-23 | Disposition: A | Discharge status: Home / Self Care | Payer: 59 | Attending: Emergency Medicine | Admitting: Emergency Medicine

## 2018-10-23 ENCOUNTER — Emergency Department (HOSPITAL_BASED_OUTPATIENT_CLINIC_OR_DEPARTMENT_OTHER): Payer: 59

## 2018-10-23 DIAGNOSIS — J45909 Unspecified asthma, uncomplicated: Secondary | ICD-10-CM | POA: Diagnosis not present

## 2018-10-23 DIAGNOSIS — Z79899 Other long term (current) drug therapy: Secondary | ICD-10-CM | POA: Insufficient documentation

## 2018-10-23 DIAGNOSIS — R7303 Prediabetes: Secondary | ICD-10-CM | POA: Insufficient documentation

## 2018-10-23 DIAGNOSIS — M791 Myalgia, unspecified site: Secondary | ICD-10-CM | POA: Diagnosis not present

## 2018-10-23 DIAGNOSIS — J441 Chronic obstructive pulmonary disease with (acute) exacerbation: Secondary | ICD-10-CM | POA: Insufficient documentation

## 2018-10-23 DIAGNOSIS — R05 Cough: Secondary | ICD-10-CM | POA: Diagnosis not present

## 2018-10-23 DIAGNOSIS — Z87891 Personal history of nicotine dependence: Secondary | ICD-10-CM | POA: Diagnosis not present

## 2018-10-23 MED ORDER — PREDNISONE 50 MG PO TABS
60.0000 mg | ORAL_TABLET | Freq: Once | ORAL | Status: AC
  Administered 2018-10-23: 60 mg via ORAL
  Filled 2018-10-23: qty 1

## 2018-10-23 MED ORDER — IPRATROPIUM-ALBUTEROL 0.5-2.5 (3) MG/3ML IN SOLN
3.0000 mL | Freq: Four times a day (QID) | RESPIRATORY_TRACT | Status: DC
  Administered 2018-10-23: 3 mL via RESPIRATORY_TRACT
  Filled 2018-10-23: qty 3

## 2018-10-23 MED ORDER — PREDNISONE 20 MG PO TABS: 60.0000 mg | ORAL_TABLET | Freq: Every day | ORAL | 0 refills | Status: AC

## 2018-10-23 MED FILL — predniSONE 20 MG TABS: 20 | 4 days supply | Qty: 12 | Fill #0

## 2018-10-23 NOTE — ED Provider Notes (Signed)
[facility] EMERGENCY DEPARTMENT Provider Note   CSN: [id number] Arrival date & time: 10/23/18  1124     History   Chief Complaint Chief Complaint  Patient presents with  . Cough    HPI [name] is a 52 y.o. female.  The history is provided by the patient.  Cough  This is a new problem. The current episode started yesterday. The problem occurs hourly. The problem has not changed since onset.The cough is non-productive. There has been no fever. The fever has been present for less than 1 day. Associated symptoms include myalgias and wheezing. Pertinent negatives include no chest pain, no chills, no sweats, no ear pain, no sore throat and no shortness of breath. She has tried nothing for the symptoms. The treatment provided no relief. Her past medical history is significant for COPD.    Past Medical History:  Diagnosis Date  . Allergy   . Anemia   . Anxiety   . Arthritis    all over  . Asthma   . COPD (chronic obstructive pulmonary disease) ([place])   . Depression   . Dysrhythmia    Irregular heat w/ asthma episodes and anxiety  . GERD (gastroesophageal reflux disease)   . Hyperlipidemia   . Hypertension   . Migraines   . PONV (postoperative nausea and vomiting)   . Pre-diabetes   . Sleep apnea    uses CPAP  . Urinary incontinence    frequent urination- tx w/ vesicare  . Urinary urgency     Patient Active Problem List   Diagnosis Date Noted  . Muscle ache 05/26/2018  . Weight gain 05/26/2018  . Vaginal irritation 03/14/2018  . Restless leg syndrome 09/05/2017  . Chronic female pelvic pain 08/15/2017  . S/P abdominal supracervical hysterectomy, bilateral salpingectomy, left oophorectomy 08/15/2017  . Chronic migraine without aura with status migrainosus, not intractable 07/26/2017  . Sinusitis 02/25/2017  . Acute bacterial sinusitis 11/22/2016  . Left knee pain 08/29/2016  . Bunion of great toe of right foot 08/29/2016  . Healthcare maintenance  08/01/2016  . Abdominal cramping 12/30/2015  . Bladder spasms 12/30/2015  . Pre-diabetes 10/03/2015  . HTN (hypertension) 10/03/2015  . Fatigue 07/21/2015  . Urinary urgency 07/06/2015  . Flank pain 07/06/2015  . Hot flashes 10/14/2014  . Vitamin D deficiency 11/25/2013  . Cough 09/10/2013  . Lower extremity edema 07/20/2013  . Back pain 06/30/2013  . Migraine 06/07/2013  . Knee pain, bilateral 10/09/2012  . Obstructive sleep apnea 09/08/2010  . HLD (hyperlipidemia) 09/20/2008  . Depression with anxiety 12/26/2006  . RHINITIS, ALLERGIC 12/26/2006  . Asthma 12/26/2006    Past Surgical History:  Procedure Laterality Date  . ABDOMINAL HYSTERECTOMY Bilateral 08/15/2017   Procedure: SUPRACERVICAL HYSTERECTOMY ABDOMINAL W/ BILATERAL SALPINGECTOMY AND LEFT OOPHORECTOMY;  Surgeon: Osborne Oman, MD;  Location: [facility];  Service: Gynecology;  Laterality: Bilateral;  . ABLATION    . CESAREAN SECTION     x3  . DILATION AND CURETTAGE OF UTERUS  05/31/2005   MAB, TAB x 2  . HERNIA REPAIR  1990's  . HYSTEROSCOPY W/ ENDOMETRIAL ABLATION    . LEEP    . MAB     x 4  . MOUTH SURGERY     teeth extraction  . TUBAL LIGATION  11/01/2006  . WISDOM TOOTH EXTRACTION       OB History    Gravida  15   Para  8   Term  8   Preterm  0   AB  7   Living  4     SAB  2   TAB  1   Ectopic      Multiple      Live Births               Home Medications    Prior to Admission medications   Medication Sig Start Date End Date Taking? Authorizing Provider  acetaminophen-codeine (TYLENOL #3) 300-30 MG tablet Take 1 tablet by mouth every 6 (six) hours as needed for moderate pain. 05/12/18   Glyn Ade, PA-C  AJOVY 225 MG/1.5ML SOSY INJECT EVERY Seth Ward SKIN 09/26/17   [provider]  albuterol (PROVENTIL) (2.5 MG/3ML) 0.083% nebulizer solution Take 3 mLs (2.5 mg total) every 4 (four) hours as needed by nebulization. 09/05/17   Caroline More, DO  albuterol  (VENTOLIN HFA) 108 (90 Base) MCG/ACT inhaler INHALE 2 PUFFS BY MOUTH EVERY 3-4 HOURS AS NEEDED FOR SHORTNESS OF BREATH\WHEEZING\RECURRENT COUGH 08/29/18   Laverle Hobby, MD  baclofen (LIORESAL) 10 MG tablet Take 1 tablet (10 mg total) by mouth 3 (three) times daily as needed (headaches). 02/17/16   Karamalegos, Devonne Doughty, DO  BIOTIN PO Take 2,000 mg by mouth daily.    [provider]  cetirizine (ZYRTEC) 10 MG tablet Take 1 tablet (10 mg total) daily by mouth. 09/05/17   Caroline More, DO  diclofenac (VOLTAREN) 75 MG EC tablet Take 1 tablet (75 mg total) by mouth 2 (two) times daily with a meal. 05/28/17   Truett Mainland, DO  eletriptan (RELPAX) 40 MG tablet Take 1 tablet (40 mg total) by mouth as needed for migraine or headache. May repeat in 2 hours if headache persists or recurs. 07/26/17   Melvenia Beam, MD  escitalopram (LEXAPRO) 20 MG tablet TAKE 1 TABLET BY MOUTH ONCE DAILY 04/10/18   Caroline More, DO  esomeprazole (NEXIUM) 40 MG capsule TAKE 1 CAPSULE BY MOUTH ONCE DAILY 03/06/18   Caroline More, DO  estrogen-methylTESTOSTERone (EEMT HS) 0.625-1.25 MG tablet Take 1 tablet by mouth daily. 08/27/18   Anyanwu, Sallyanne Havers, MD  fluticasone (FLONASE) 50 MCG/ACT nasal spray PLACE 2 SPRAYS INTO THE NOSE DAILY. 07/01/18   Caroline More, DO  furosemide (LASIX) 20 MG tablet Take 1 tablet (20 mg total) by mouth daily. 02/27/18   Rogue Bussing, MD  gabapentin (NEURONTIN) 300 MG capsule Take 1 capsule (300 mg total) by mouth every 8 (eight) hours as needed (PAIN). Can take 600 mg at night prn hot flashes 05/23/18   Caroline More, DO  HYDROcodone-acetaminophen (NORCO/VICODIN) 5-325 MG tablet Take 1 tablet by mouth every 6 (six) hours as needed. 06/01/18   Horton, Barbette Hair, MD  losartan (COZAAR) 50 MG tablet TAKE 1 TABLET BY MOUTH ONCE DAILY 06/19/18   Nuala Alpha, DO  meloxicam (MOBIC) 15 MG tablet Take 1 tablet (15 mg total) by mouth daily. 10/16/17   Palumbo, April, MD    methocarbamol (ROBAXIN) 500 MG tablet Take 1 tablet (500 mg total) by mouth 2 (two) times daily. 07/01/18   Tammi Klippel, Sherin, DO  mometasone-formoterol (DULERA) 200-5 MCG/ACT AERO INHALE 2 PUFFS BY MOUTH 2 TIMES DAILY. 08/29/18   Laverle Hobby, MD  montelukast (SINGULAIR) 10 MG tablet Take 1 tablet (10 mg total) by mouth at bedtime. 12/30/15   Virginia Crews, MD  Multiple Vitamin (MULTIVITAMIN WITH MINERALS) TABS tablet Take 1 tablet by mouth daily.    [provider]  naproxen (NAPROSYN)  500 MG tablet Take 1 tablet (500 mg total) by mouth 2 (two) times daily. 07/01/18   Caroline More, DO  NONFORMULARY OR COMPOUNDED ITEM Inject 1 application every 30 (thirty) days into the skin. 09/10/17   Melvenia Beam, MD  ondansetron (ZOFRAN) 4 MG tablet Take 1 tablet (4 mg total) by mouth every 8 (eight) hours as needed for nausea or vomiting. 05/29/17   Rasch, Anderson Malta I, NP  oxybutynin (DITROPAN-XL) 10 MG 24 hr tablet TAKE 1 TABLET BY MOUTH AT BEDTIME 07/30/18   Caroline More, DO  pravastatin (PRAVACHOL) 40 MG tablet Take 1 tablet (40 mg total) by mouth daily. If muscle pain may take every other day. 05/28/18   Caroline More, DO  predniSONE (DELTASONE) 20 MG tablet Take 3 tablets (60 mg total) by mouth daily for 4 days. 10/23/18 10/27/18  Fabien Travelstead, DO  Pyridoxine HCl (VITAMIN B-6 PO) Take 1 tablet by mouth daily. Unknown OTC strength    [provider]  simvastatin (ZOCOR) 40 MG tablet Take 1 tablet (40 mg total) by mouth at bedtime. 04/11/18   Caroline More, DO  Spacer/Aero-Holding Chambers (AEROCHAMBER MV) inhaler Use as instructed 05/09/15   Vilinda Boehringer, MD  SUMAtriptan (IMITREX) 50 MG tablet Take 50 mg by mouth every 2 (two) hours as needed for migraine. May repeat in 2 hours if headache persists or recurs.    [provider]  Tiotropium Bromide Monohydrate (SPIRIVA RESPIMAT) 2.5 MCG/ACT AERS Inhale 2 puffs into the lungs daily. 08/29/18   Laverle Hobby,  MD  traMADol (ULTRAM) 50 MG tablet Take 2 tablets (100 mg total) by mouth every 6 (six) hours as needed. Patient taking differently: Take 100 mg by mouth every 6 (six) hours as needed for moderate pain.  06/06/17   Anyanwu, Sallyanne Havers, MD  vitamin C (ASCORBIC ACID) 500 MG tablet Take 500 mg by mouth daily.     [provider]  medroxyPROGESTERone (DEPO-PROVERA) 150 MG/ML injection Inject 1 mL (150 mg total) into the muscle every 3 (three) months. 08/23/11 01/12/12  Lyndee Hensen, MD  medroxyPROGESTERone (PROVERA) 10 MG tablet Take 2 tablets (20 mg total) by mouth daily. 11/27/11 01/12/12  Osborne Oman, MD    Family History Family History  Problem Relation Age of Onset  . Diabetes Maternal Grandmother   . Colon cancer Maternal Grandfather   . Anesthesia problems Neg Hx   . Headache Neg Hx   . Aneurysm Neg Hx   . Esophageal cancer Neg Hx   . Rectal cancer Neg Hx   . Stomach cancer Neg Hx     Social History Social History   Tobacco Use  . Smoking status: Former Smoker    Packs/day: 0.25    Years: 15.00    Pack years: 3.75    Types: Cigarettes    Last attempt to quit: 02/16/2003    Years since quitting: 15.6  . Smokeless tobacco: Never Used  Substance Use Topics  . Alcohol use: Yes    Comment: socially  . Drug use: No     Allergies   Amitriptyline; Fluticasone-salmeterol; Penicillins; Ciprofloxacin; Macrobid [nitrofurantoin monohyd macro]; and Sulfamethoxazole-trimethoprim   Review of Systems Review of Systems  Constitutional: Negative for chills and fever.  HENT: Negative for ear pain and sore throat.   Eyes: Negative for pain and visual disturbance.  Respiratory: Positive for cough and wheezing. Negative for shortness of breath.   Cardiovascular: Negative for chest pain and palpitations.  Gastrointestinal: Negative for abdominal pain and vomiting.  Genitourinary: Negative for dysuria and hematuria.  Musculoskeletal: Positive for myalgias. Negative for  arthralgias and back pain.  Skin: Negative for color change and rash.  Neurological: Negative for seizures and syncope.  All other systems reviewed and are negative.    Physical Exam Updated Vital Signs  ED Triage Vitals  Enc Vitals Group     BP 10/23/18 1134 (!) 151/90     Pulse Rate 10/23/18 1134 71     Resp 10/23/18 1134 18     Temp 10/23/18 1134 98.1 F (36.7 C)     Temp Source 10/23/18 1134 Oral     SpO2 10/23/18 1134 100 %     Weight 10/23/18 1135 205 lb (93 kg)     Height 10/23/18 1133 5\' 1"  (1.549 m)     Head Circumference --      Peak Flow --      Pain Score 10/23/18 1133 6     Pain Loc --      Pain Edu? --      Excl. in [place]? --     Physical Exam Vitals signs and nursing note reviewed.  Constitutional:      General: She is not in acute distress.    Appearance: She is well-developed.  HENT:     Head: Normocephalic and atraumatic.     Right Ear: Tympanic membrane normal.     Left Ear: Tympanic membrane normal.     Nose: Nose normal.     Mouth/Throat:     Mouth: Mucous membranes are moist.  Eyes:     Extraocular Movements: Extraocular movements intact.     Conjunctiva/sclera: Conjunctivae normal.     Pupils: Pupils are equal, round, and reactive to light.  Neck:     Musculoskeletal: Normal range of motion and neck supple.  Cardiovascular:     Rate and Rhythm: Normal rate and regular rhythm.     Pulses: Normal pulses.     Heart sounds: Normal heart sounds. No murmur.  Pulmonary:     Effort: Pulmonary effort is normal. No respiratory distress.     Breath sounds: Wheezing present.  Abdominal:     Palpations: Abdomen is soft.     Tenderness: There is no abdominal tenderness.  Skin:    General: Skin is warm and dry.     Capillary Refill: Capillary refill takes less than 2 seconds.  Neurological:     General: No focal deficit present.     Mental Status: She is alert.  Psychiatric:        Mood and Affect: Mood normal.      ED Treatments / Results    Labs (all labs ordered are listed, but only abnormal results are displayed) Labs Reviewed - No data to display  EKG None  Radiology Dg Chest 2 View  Result Date: 10/23/2018 CLINICAL DATA:  Cough, fever, congestion EXAM: CHEST - 2 VIEW COMPARISON:  02/15/2017 FINDINGS: There are low lung volumes. There is no focal parenchymal opacity. There is no pleural effusion or pneumothorax. There is stable cardiomegaly. The osseous structures are unremarkable. IMPRESSION: No active cardiopulmonary disease. Electronically Signed   By: Kathreen Devoid   On: 10/23/2018 12:21    Procedures Procedures (including critical care time)  Medications Ordered in ED Medications  ipratropium-albuterol (DUONEB) 0.5-2.5 (3) MG/3ML nebulizer solution 3 mL (3 mLs Nebulization Given 10/23/18 1158)  predniSONE (DELTASONE) tablet 60 mg (60 mg Oral Given 10/23/18 1158)     Initial Impression / Assessment and Plan /  ED Course  I have reviewed the triage vital signs and the nursing notes.  Pertinent labs & imaging results that were available during my care of the patient were reviewed by me and considered in my medical decision making (see chart for details).     ROCHELE LUECK is a 52 year old female history of COPD who presents to the ED with cough, upper respiratory symptoms.  Patient also with wheezing.  Patient with normal vitals.  No fever.  Symptoms for the last 2 days.  Patient has had some body aches as well.  Likely viral process.  Chest x-ray showed no pneumonia.  Patient with mild wheezing on exam.  Given breathing treatment, prednisone with improvement.  Likely mild asthma/COPD exacerbation caused by viral process.  Patient does not have any chest pain, no signs of volume overload.  No concern for ACS.  Patient not hypoxic, not tachycardic, no PE risk factors.  Doubt PE.  Will treat with prednisone outpatient.  Given education on how to use her albuterol inhaler.  Given return precautions and discharged from  the ED in good condition.  This chart was dictated using voice recognition software.  Despite best efforts to proofread,  errors can occur which can change the documentation meaning.  Final Clinical Impressions(s) / ED Diagnoses   Final diagnoses:  COPD exacerbation [facility])    ED Discharge Orders         Ordered    predniSONE (DELTASONE) 20 MG tablet  Daily     10/23/18 1245           Lennice Sites, DO 10/23/18 1248

## 2018-10-23 NOTE — ED Notes (Signed)
ED Provider at bedside. 

## 2018-10-23 NOTE — ED Triage Notes (Signed)
Pt reports cough and congestion x 1 week. She states she feels swollen all over.

## 2018-10-23 NOTE — Discharge Instructions (Signed)
Use inhaler every 4 hours as instructed.  Take about 4 puffs every time.

## 2018-10-27 ENCOUNTER — Other Ambulatory Visit: Payer: Self-pay | Admitting: Family Medicine

## 2018-10-27 DIAGNOSIS — Z1231 Encounter for screening mammogram for malignant neoplasm of breast: Secondary | ICD-10-CM

## 2018-11-05 ENCOUNTER — Encounter: Payer: Self-pay | Admitting: Internal Medicine

## 2018-11-05 ENCOUNTER — Ambulatory Visit: Payer: 59 | Admitting: Internal Medicine

## 2018-11-05 VITALS — BP 140/88 | HR 80 | Ht 61.0 in | Wt 210.4 lb

## 2018-11-05 DIAGNOSIS — J45909 Unspecified asthma, uncomplicated: Secondary | ICD-10-CM

## 2018-11-05 DIAGNOSIS — G4733 Obstructive sleep apnea (adult) (pediatric): Secondary | ICD-10-CM

## 2018-11-05 DIAGNOSIS — H5213 Myopia, bilateral: Secondary | ICD-10-CM | POA: Diagnosis not present

## 2018-11-05 DIAGNOSIS — J4551 Severe persistent asthma with (acute) exacerbation: Secondary | ICD-10-CM | POA: Diagnosis not present

## 2018-11-05 MED ORDER — BENZONATATE 100 MG PO CAPS: 200.0000 mg | ORAL_CAPSULE | Freq: Three times a day (TID) | ORAL | 2 refills | Status: AC

## 2018-11-05 MED FILL — BENZONATATE 100 MG CAPS: 100 | 15 days supply | Qty: 90 | Fill #0

## 2018-11-05 NOTE — Progress Notes (Signed)
* [name]     Assessment and Plan:  Obstructive sleep apnea. -Poor compliance, continued daytime sleepiness. - Reiterated the importance of nightly use of CPAP for the entire night.   Circadian rhythm disorder/shift work sleep disorder -Patient works an overnight shift, this may be contributing to her daytime sleepiness. -Recommend continued use of daily CPAP.  Excessive daytime sleepiness. -Multifactorial from sedating medication, OSA, shift work disorder.   Asthma with acute bronchitis/acute exacerbation. --Recently seen in ED given course of nebulizer, prednisone.  Asked to continue nebulizer, usual inhalers, given course of Tessalon to treat her acute cough. --Advised to use Dulera 2 puffs twice daily, Spiriva once daily.  She may use her albuterol inhaler as needed.  Meds ordered this encounter  Medications  . benzonatate (TESSALON PERLES) 100 MG capsule    Sig: Take 2 capsules (200 mg total) by mouth 3 (three) times daily.    Dispense:  90 capsule    Refill:  2     Return in about 3 months (around 02/04/2019).   Date: 11/05/2018  MRN# [id number] [name] Jul 11, 1966   [name] is a 53 y.o. old female seen in follow up for chief complaint of  Chief Complaint  Patient presents with  . Acute Visit    couple weeks duration   . Cough    dry hacking cough until she gags  . Sleep Apnea    not able to use CPAP while congested  . Shortness of Breath    with wheezing with exertion      HPI:   The patient is a 53 yo female with asthma, mild OSA, on CPAP 5-20 cm H2O, also with shift workers disorder. At last visit she was not using CPAP regularly. At last visit she was advised to Continue dulera, spiriva, singulair, zyrtec, nebs, albuterol mdi and use cpap regularly. She often speaks with her eyes closed.  She is here today because she has been having cough, excess mucus production. She went to the ED on 12/26, she was given a neb treatment,  prednisone. Since then she continues to have cough and sinus drainage. She has a bad dry cough, non-productive. She is not taking anything for the cough, but cough drops help.  She remains on dulera, spiriva.   She has not been using CPAP due to the CPAP.   **Download data 07/22/2016-08/20/2016>> raw data personally reviewed.  Usage greater than 4 hours is 13/30 days.  Average usage on days used is 4 hours 20 minutes.  Pressure ranges 5-20 on auto CPAP.  Median pressure is 8, 95th percentile pressure is 10, maximum pressure is 12.  Residual AHI 0.7.  Overall this shows poor compliance with excellent control of obstructive sleep apnea when used. **Download data personally reviewed, 3 days as of 08/20/2016.  Usage greater than 4 hours is 13/30 days.  Average usage on days used is 4 hours 20 minutes.  Pressure ranges 5-20.  Median pressure 7, 95th percentile pressure is 10, maximum pressure is 11.  Residual AHI is 0.7.  While this test shows suboptimal use of CPAP with excellent control of obstructive sleep apnea when used. **Download data 9/24-10/23/17:23/30 days; greater than 4 hours equals 13 days. 43%. Average usage is 4 hours 20 minutes. Onset 5-20; 95th percentile pressure is 10, residual AHI 0.7.  She takes nexium once in am. She used to get severe "strangling" if she eats close to bed time which improved when she stopped eating close  to bedtime.   They have a dog but not in her bedroom.    Medication:   Outpatient Encounter Medications as of 11/05/2018  Medication Sig  . acetaminophen-codeine (TYLENOL #3) 300-30 MG tablet Take 1 tablet by mouth every 6 (six) hours as needed for moderate pain.  Marland Kitchen AJOVY 225 MG/1.5ML SOSY INJECT EVERY 30 DAY INTO THE SKIN  . albuterol (PROVENTIL) (2.5 MG/3ML) 0.083% nebulizer solution Take 3 mLs (2.5 mg total) every 4 (four) hours as needed by nebulization.  Marland Kitchen albuterol (VENTOLIN HFA) 108 (90 Base) MCG/ACT inhaler INHALE 2 PUFFS BY MOUTH EVERY 3-4 HOURS AS NEEDED  FOR SHORTNESS OF BREATH\WHEEZING\RECURRENT COUGH  . baclofen (LIORESAL) 10 MG tablet Take 1 tablet (10 mg total) by mouth 3 (three) times daily as needed (headaches).  Marland Kitchen BIOTIN PO Take 2,000 mg by mouth daily.  . cetirizine (ZYRTEC) 10 MG tablet Take 1 tablet (10 mg total) daily by mouth.  . diclofenac (VOLTAREN) 75 MG EC tablet Take 1 tablet (75 mg total) by mouth 2 (two) times daily with a meal.  . eletriptan (RELPAX) 40 MG tablet Take 1 tablet (40 mg total) by mouth as needed for migraine or headache. May repeat in 2 hours if headache persists or recurs.  Marland Kitchen escitalopram (LEXAPRO) 20 MG tablet TAKE 1 TABLET BY MOUTH ONCE DAILY  . esomeprazole (NEXIUM) 40 MG capsule TAKE 1 CAPSULE BY MOUTH ONCE DAILY  . estrogen-methylTESTOSTERone (EEMT HS) 0.625-1.25 MG tablet Take 1 tablet by mouth daily.  . fluticasone (FLONASE) 50 MCG/ACT nasal spray PLACE 2 SPRAYS INTO THE NOSE DAILY.  . furosemide (LASIX) 20 MG tablet Take 1 tablet (20 mg total) by mouth daily.  Marland Kitchen gabapentin (NEURONTIN) 300 MG capsule Take 1 capsule (300 mg total) by mouth every 8 (eight) hours as needed (PAIN). Can take 600 mg at night prn hot flashes  . HYDROcodone-acetaminophen (NORCO/VICODIN) 5-325 MG tablet Take 1 tablet by mouth every 6 (six) hours as needed.  Marland Kitchen losartan (COZAAR) 50 MG tablet TAKE 1 TABLET BY MOUTH ONCE DAILY  . meloxicam (MOBIC) 15 MG tablet Take 1 tablet (15 mg total) by mouth daily.  . methocarbamol (ROBAXIN) 500 MG tablet Take 1 tablet (500 mg total) by mouth 2 (two) times daily.  . mometasone-formoterol (DULERA) 200-5 MCG/ACT AERO INHALE 2 PUFFS BY MOUTH 2 TIMES DAILY.  . montelukast (SINGULAIR) 10 MG tablet Take 1 tablet (10 mg total) by mouth at bedtime.  . Multiple Vitamin (MULTIVITAMIN WITH MINERALS) TABS tablet Take 1 tablet by mouth daily.  . naproxen (NAPROSYN) 500 MG tablet Take 1 tablet (500 mg total) by mouth 2 (two) times daily.  . NONFORMULARY OR COMPOUNDED ITEM Inject 1 application every 30 (thirty)  days into the skin.  Marland Kitchen ondansetron (ZOFRAN) 4 MG tablet Take 1 tablet (4 mg total) by mouth every 8 (eight) hours as needed for nausea or vomiting.  Marland Kitchen oxybutynin (DITROPAN-XL) 10 MG 24 hr tablet TAKE 1 TABLET BY MOUTH AT BEDTIME  . pravastatin (PRAVACHOL) 40 MG tablet Take 1 tablet (40 mg total) by mouth daily. If muscle pain may take every other day.  . Pyridoxine HCl (VITAMIN B-6 PO) Take 1 tablet by mouth daily. Unknown OTC strength  . simvastatin (ZOCOR) 40 MG tablet Take 1 tablet (40 mg total) by mouth at bedtime.  Marland Kitchen Spacer/Aero-Holding Chambers (AEROCHAMBER MV) inhaler Use as instructed  . SUMAtriptan (IMITREX) 50 MG tablet Take 50 mg by mouth every 2 (two) hours as needed for migraine. May repeat in 2  hours if headache persists or recurs.  . Tiotropium Bromide Monohydrate (SPIRIVA RESPIMAT) 2.5 MCG/ACT AERS Inhale 2 puffs into the lungs daily.  . traMADol (ULTRAM) 50 MG tablet Take 2 tablets (100 mg total) by mouth every 6 (six) hours as needed. (Patient taking differently: Take 100 mg by mouth every 6 (six) hours as needed for moderate pain. )  . vitamin C (ASCORBIC ACID) 500 MG tablet Take 500 mg by mouth daily.   . [DISCONTINUED] medroxyPROGESTERone (DEPO-PROVERA) 150 MG/ML injection Inject 1 mL (150 mg total) into the muscle every 3 (three) months.  . [DISCONTINUED] medroxyPROGESTERone (PROVERA) 10 MG tablet Take 2 tablets (20 mg total) by mouth daily.   Facility-Administered Encounter Medications as of 11/05/2018  Medication  . 0.9 %  sodium chloride infusion     Allergies:  Amitriptyline; Fluticasone-salmeterol; Penicillins; Ciprofloxacin; Macrobid [nitrofurantoin monohyd macro]; and Sulfamethoxazole-trimethoprim  Review of Systems:  Constitutional: Feels well. Cardiovascular: Denies chest pain, exertional chest pain.  Pulmonary: Denies hemoptysis, pleuritic chest pain.   The remainder of systems were reviewed and were found to be negative other than what is documented in the  HPI.    Physical Examination:   VS: BP 140/88 (BP Location: Left Arm, Cuff Size: Normal)   Pulse 80   Ht 5\' 1"  (1.549 m)   Wt 210 lb 6.4 oz (95.4 kg)   LMP 05/21/2017 (Within Weeks)   SpO2 98%   BMI 39.75 kg/m   General Appearance: No distress  Neuro:without focal findings, mental status, speech normal, alert and oriented HEENT: PERRLA, EOM intact Pulmonary: No wheezing, No rales  CardiovascularNormal S1,S2.  No m/r/g.  Abdomen: Benign, Soft, non-tender, No masses Renal:  No costovertebral tenderness  GU:  No performed at this time. Endoc: No evident thyromegaly, no signs of acromegaly or Cushing features Skin:   warm, no rashes, no ecchymosis  Extremities: normal, no cyanosis, clubbing.    LABORATORY PANEL:   CBC No results for input(s): WBC, HGB, HCT, PLT in the last 168 hours. ------------------------------------------------------------------------------------------------------------------  Chemistries  No results for input(s): NA, K, CL, CO2, GLUCOSE, BUN, CREATININE, CALCIUM, MG, AST, ALT, ALKPHOS, BILITOT in the last 168 hours.  Invalid input(s): GFRCGP ------------------------------------------------------------------------------------------------------------------  Cardiac Enzymes No results for input(s): TROPONINI in the last 168 hours. ------------------------------------------------------------  RADIOLOGY:   No results found for this or any previous visit. Results for orders placed during the hospital encounter of 11/28/15  DG Chest 2 View   Narrative CLINICAL DATA:  Right-sided chest pain for 3 days no fever  EXAM: CHEST  2 VIEW  COMPARISON:  08/17/2015  FINDINGS: Cardiac silhouette upper normal in size but stable. Vascular pattern normal. Lungs clear. No effusions.  IMPRESSION: No active cardiopulmonary disease.   Electronically Signed   By: Skipper Cliche M.D.   On: 11/28/2015 12:37     ------------------------------------------------------------------------------------------------------------------  Thank  you for allowing [facility] to assist in the care of your patient. Our recommendations are noted above.  Please contact us if we can be of further service.  Marda Stalker, M.D., F.C.C.P.  Board Certified in Internal Kelly, Pulmonary Kelly, [place], and Sleep Kelly.  [facility] Office Number: [phone]   11/05/2018

## 2018-11-05 NOTE — Patient Instructions (Addendum)
Continue flonase, will start tessalon 2 tablets three times per day.

## 2018-11-07 ENCOUNTER — Other Ambulatory Visit: Payer: Self-pay

## 2018-11-07 ENCOUNTER — Encounter: Payer: Self-pay | Admitting: Family Medicine

## 2018-11-07 ENCOUNTER — Ambulatory Visit: Payer: 59 | Admitting: Family Medicine

## 2018-11-07 VITALS — BP 110/72 | HR 74 | Temp 98.7°F | Ht 61.0 in | Wt 209.0 lb

## 2018-11-07 DIAGNOSIS — R600 Localized edema: Secondary | ICD-10-CM | POA: Diagnosis not present

## 2018-11-07 DIAGNOSIS — R0600 Dyspnea, unspecified: Secondary | ICD-10-CM

## 2018-11-07 DIAGNOSIS — R0609 Other forms of dyspnea: Secondary | ICD-10-CM | POA: Diagnosis not present

## 2018-11-07 DIAGNOSIS — R601 Generalized edema: Secondary | ICD-10-CM | POA: Diagnosis not present

## 2018-11-07 NOTE — Patient Instructions (Signed)
It was a pleasure seeing you today.   Today we discussed your swelling  For your swelling: I have gotten labs and an echo. If they are abnormal I will give you a call.   Please follow up in 1 month or sooner if symptoms persist or worsen. Please call the clinic immediately if you have any concerns.   Our clinic's number is [phone]. Please call with questions or concerns.   Please go to the emergency room if shortness of breath or chest pain  Thank you,  Caroline More, DO

## 2018-11-08 DIAGNOSIS — R601 Generalized edema: Secondary | ICD-10-CM | POA: Insufficient documentation

## 2018-11-08 LAB — COMPREHENSIVE METABOLIC PANEL
ALK PHOS: 53 IU/L (ref 39–117)
ALT: 23 IU/L (ref 0–32)
AST: 17 IU/L (ref 0–40)
Albumin/Globulin Ratio: 1.4 (ref 1.2–2.2)
Albumin: 4.4 g/dL (ref 3.5–5.5)
BILIRUBIN TOTAL: 0.3 mg/dL (ref 0.0–1.2)
BUN / CREAT RATIO: 15 (ref 9–23)
BUN: 13 mg/dL (ref 6–24)
CHLORIDE: 99 mmol/L (ref 96–106)
CO2: 25 mmol/L (ref 20–29)
CREATININE: 0.85 mg/dL (ref 0.57–1.00)
Calcium: 9.9 mg/dL (ref 8.7–10.2)
GFR calc Af Amer: 92 mL/min/{1.73_m2} (ref >59)
GFR calc non Af Amer: 80 mL/min/{1.73_m2} (ref >59)
GLUCOSE: 85 mg/dL (ref 65–99)
Globulin, Total: 3.2 g/dL (ref 1.5–4.5)
Potassium: 4 mmol/L (ref 3.5–5.2)
Sodium: 141 mmol/L (ref 134–144)
Total Protein: 7.6 g/dL (ref 6.0–8.5)

## 2018-11-08 LAB — CBC
Hematocrit: 38.7 % (ref 34.0–46.6)
Hemoglobin: 13 g/dL (ref 11.1–15.9)
MCH: 30.7 pg (ref 26.6–33.0)
MCHC: 33.6 g/dL (ref 31.5–35.7)
MCV: 92 fL (ref 79–97)
PLATELETS: 287 10*3/uL (ref 150–450)
RBC: 4.23 x10E6/uL (ref 3.77–5.28)
RDW: 12.6 % (ref 11.7–15.4)
WBC: 5.1 10*3/uL (ref 3.4–10.8)

## 2018-11-08 LAB — LIPID PANEL
CHOLESTEROL TOTAL: 225 mg/dL — AB (ref 100–199)
Chol/HDL Ratio: 3.8 ratio (ref 0.0–4.4)
HDL: 59 mg/dL (ref >39)
LDL CALC: 140 mg/dL — AB (ref 0–99)
TRIGLYCERIDES: 132 mg/dL (ref 0–149)
VLDL CHOLESTEROL CAL: 26 mg/dL (ref 5–40)

## 2018-11-08 LAB — PROTEIN / CREATININE RATIO, URINE
CREATININE, UR: 96.2 mg/dL
PROTEIN UR: 6.2 mg/dL
Protein/Creat Ratio: 64 mg/g creat (ref 0–200)

## 2018-11-08 LAB — TSH: TSH: 2.33 u[IU]/mL (ref 0.450–4.500)

## 2018-11-08 NOTE — Progress Notes (Signed)
Subjective:    Patient ID: [name], female    DOB: April 27, 1966, 53 y.o.   MRN: [id number]   CC: swelling  HPI: Swelling Patient presenting today for concerns of edema.  States that she has had total body swelling since around Christmas time.  At that time she presented to the emergency department was told she has a COPD exacerbation was started on some steroid treatment, however, patient states that edema began before she even started her steroids.  Has now completed her steroid course.  Patient is especially worried because she notes stomach swelling.  Her face is also been swollen and she has been taking pictures to show edema.  Patient also says people at work have noticed she has been very swollen.  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  Denies any new allergies or exposure to any allergens.  No new products used.  Had shortness of breath that time a COPD exacerbation but after treatment no longer has shortness of breath.  Has some throat irritation recently but has been using cough drops.  Saw pulmonologist 2 days ago.  Does note some weight gain as well.  Has been working on diet and exercise so she is unsure why she is gaining so much weight.   Objective:  BP 110/72   Pulse 74   Temp 98.7 F (37.1 C) (Oral)   Ht 5\' 1"  (1.549 m)   Wt 209 lb (94.8 kg)   LMP 05/21/2017 (Within Weeks)   SpO2 99%   BMI 39.49 kg/m  Vitals and nursing note reviewed  General: well nourished, in no acute distress, facial edema HEENT: normocephalic, PERRL, no scleral icterus or conjunctival pallor, no nasal discharge, moist mucous membranes, good dentition without erythema or discharge noted in posterior oropharynx Neck: supple, non-tender, without lymphadenopathy Cardiac: RRR, clear S1 and S2, no murmurs, rubs, or gallops Respiratory: clear to auscultation bilaterally, no increased work of breathing Abdomen: soft, nontender, distended, no masses or organomegaly. Bowel sounds  present Extremities: minimal non pitting edema, no cyanosis. Warm, well perfused. 2+ radial and PT pulses bilaterally Skin: warm and dry, no rashes noted Neuro: alert and oriented, no focal deficits   Assessment & Plan:    Anasarca Unclear etiology at this time.  Unlikely related to steroid use as edema began before steroids were initiated.  Can consider anemia.  Will obtain CBC to rule this out. Recent CBC from July showing hemoglobin of 14.4.  Can consider low albumin or liver disease.  Will obtain CMP to rule this out.  Can also consider renal failure so will obtain CMP, urine protein to creatinine ratio.  We will also obtain lipid panel to rule out nephrotic syndrome.  Can also consider myxedema so will obtain TSH.  Can consider cardiac etiology, no previous known history of CHF.  Last echocardiogram from 06/2015 showing LVEF 65 to 70% with grade 1 diastolic dysfunction.  Will obtain echocardiogram, this was scheduled for patient before she left office and was informed of date and time.  Medication list includes naproxen, could consider increased NSAID use as well.  Will obtain lab work.  If hemoglobin is less than 7 or creatinine is elevated will advised patient to go to the emergency department.  Advised patient to follow-up in 1 month to ensure improvement.  Patient is actively working on healthy diet and exercise as well.   Discussed patient with Dr. Owens Shark  Return in about 1 month (around 12/08/2018).   Caroline More, DO,  PGY-2

## 2018-11-08 NOTE — Assessment & Plan Note (Signed)
Unclear etiology at this time.  Unlikely related to steroid use as edema began before steroids were initiated.  Can consider anemia.  Will obtain CBC to rule this out. Recent CBC from July showing hemoglobin of 14.4.  Can consider low albumin or liver disease.  Will obtain CMP to rule this out.  Can also consider renal failure so will obtain CMP, urine protein to creatinine ratio.  We will also obtain lipid panel to rule out nephrotic syndrome.  Can also consider myxedema so will obtain TSH.  Can consider cardiac etiology, no previous known history of CHF.  Last echocardiogram from 06/2015 showing LVEF 65 to 70% with grade 1 diastolic dysfunction.  Will obtain echocardiogram, this was scheduled for patient before she left office and was informed of date and time.  Medication list includes naproxen, could consider increased NSAID use as well.  Will obtain lab work.  If hemoglobin is less than 7 or creatinine is elevated will advised patient to go to the emergency department.  Advised patient to follow-up in 1 month to ensure improvement.  Patient is actively working on healthy diet and exercise as well.

## 2018-11-20 ENCOUNTER — Ambulatory Visit (HOSPITAL_COMMUNITY)
Admission: RE | Admit: 2018-11-20 | Discharge: 2018-11-20 | Disposition: A | Discharge status: Home / Self Care | Payer: 59 | Source: Ambulatory Visit | Attending: Family Medicine | Admitting: Family Medicine

## 2018-11-20 DIAGNOSIS — R0609 Other forms of dyspnea: Secondary | ICD-10-CM | POA: Insufficient documentation

## 2018-11-20 DIAGNOSIS — R609 Edema, unspecified: Secondary | ICD-10-CM | POA: Diagnosis not present

## 2018-11-20 DIAGNOSIS — K769 Liver disease, unspecified: Secondary | ICD-10-CM | POA: Insufficient documentation

## 2018-11-20 DIAGNOSIS — G4730 Sleep apnea, unspecified: Secondary | ICD-10-CM | POA: Diagnosis not present

## 2018-11-20 DIAGNOSIS — J449 Chronic obstructive pulmonary disease, unspecified: Secondary | ICD-10-CM | POA: Insufficient documentation

## 2018-11-20 DIAGNOSIS — R0600 Dyspnea, unspecified: Secondary | ICD-10-CM

## 2018-11-20 NOTE — Progress Notes (Signed)
  Echocardiogram 2D Echocardiogram has been performed.  [name] 11/20/2018, 1:49 PM

## 2018-11-21 ENCOUNTER — Telehealth: Payer: Self-pay | Admitting: Family Medicine

## 2018-11-21 DIAGNOSIS — R601 Generalized edema: Secondary | ICD-10-CM

## 2018-11-21 DIAGNOSIS — I5032 Chronic diastolic (congestive) heart failure: Secondary | ICD-10-CM

## 2018-11-21 DIAGNOSIS — K7689 Other specified diseases of liver: Secondary | ICD-10-CM

## 2018-11-21 MED ORDER — FUROSEMIDE 20 MG PO TABS: 20.0000 mg | ORAL_TABLET | Freq: Every day | ORAL | 0 refills | Status: DC

## 2018-11-21 MED FILL — FUROSEMIDE 20 MG TABS: 20 | 30 days supply | Qty: 30 | Fill #0

## 2018-11-21 NOTE — Telephone Encounter (Signed)
Attempted to call patient with no answer.  Left voicemail to call clinic back if she receives voicemail.  Patient does not call back please call patient and inform her that her echocardiogram was normal heart function.  Given that her echocardiogram and other lab work has been normal thus far, we will plan to refer to cardiology and restart her Lasix.  I will also obtain a right upper quadrant ultrasound to evaluate the liver cyst seen on echocardiogram. Please call and schedule this for patient.   Patient does not have my chart set up to give her this information via my chart.  Happy to discuss this with patient further, unfortunately I was unable to reach her over the phone. If cardiology unable to find source can consider referral to nephrology in the future.   Dalphine Handing, PGY-2 [facility] 11/21/2018 1:40 PM

## 2018-11-21 NOTE — Telephone Encounter (Signed)
Pt returned call.  Informed of message from MD.  She wanted to let the MD know that when she was off last week and did not have swelling but as soon as she went back to work this week she started swelling.   RED TEAM:  Pt is aware to be on the lookout for a call with ultrasound date and time. Fleeger, Salome Spotted, CMA

## 2018-11-24 ENCOUNTER — Other Ambulatory Visit: Payer: Self-pay | Admitting: Neurology

## 2018-11-24 ENCOUNTER — Other Ambulatory Visit: Payer: Self-pay | Admitting: Family Medicine

## 2018-11-24 ENCOUNTER — Other Ambulatory Visit (HOSPITAL_COMMUNITY): Payer: Self-pay | Admitting: Obstetrics & Gynecology

## 2018-11-24 MED FILL — LOSARTAN POTASSIUM 50 MG TA: 50 | 30 days supply | Qty: 30 | Fill #0

## 2018-11-24 MED FILL — GABAPENTIN 300 MG CAPSULE: 300 | 22 days supply | Qty: 90 | Fill #3

## 2018-11-24 MED FILL — EEMT HS 0.625-1.25 MG TAB: 0.625-1.25 | 90 days supply | Qty: 90 | Fill #1

## 2018-11-24 MED FILL — SPIRIVA RESPIMAT INHAL SPRY: 2.5 | 30 days supply | Qty: 4 | Fill #1

## 2018-11-24 MED FILL — VENTOLIN HFA 90 MCG INHALER: 108 (90 BAS | 13 days supply | Qty: 18 | Fill #1

## 2018-11-24 MED FILL — ESCITALOPRAM 20 MG TABLET: 20 | 30 days supply | Qty: 30 | Fill #0

## 2018-11-24 MED FILL — DULERA 200 MCG/5 MCG INH: 200-5 | 30 days supply | Qty: 13 | Fill #1

## 2018-11-24 MED FILL — NAPROXEN 500 MG TABLET: 500 | 30 days supply | Qty: 60 | Fill #0

## 2018-11-24 MED FILL — ESOMEPRAZOLE MAG DR 40 MG C: 40 | 30 days supply | Qty: 30 | Fill #5

## 2018-11-25 ENCOUNTER — Telehealth: Payer: Self-pay

## 2018-11-25 MED FILL — AJOVY 225 MG/1.5ML SOSY: 225 | 30 days supply | Qty: 2 | Fill #0

## 2018-11-25 NOTE — Telephone Encounter (Signed)
Called and informed patient of upcoming Ultra Sound 11/28/2018 at [facility] [place].  Show time is 1115. Patient is to be NPO 6 hours prior to appointment. No eating after midnight.  Ozella Almond, [place]

## 2018-11-26 ENCOUNTER — Ambulatory Visit
Admission: RE | Admit: 2018-11-26 | Discharge: 2018-11-26 | Disposition: A | Discharge status: Home / Self Care | Payer: 59 | Source: Ambulatory Visit | Attending: Internal Medicine | Admitting: Internal Medicine

## 2018-11-26 DIAGNOSIS — Z1231 Encounter for screening mammogram for malignant neoplasm of breast: Secondary | ICD-10-CM

## 2018-11-26 NOTE — Telephone Encounter (Signed)
Attempted to call pt yesterday, no answer/voicemail full. Torina Ey Kennon Holter, CMA

## 2018-11-27 ENCOUNTER — Other Ambulatory Visit: Payer: Self-pay | Admitting: Internal Medicine

## 2018-11-27 DIAGNOSIS — R928 Other abnormal and inconclusive findings on diagnostic imaging of breast: Secondary | ICD-10-CM

## 2018-11-28 ENCOUNTER — Ambulatory Visit (HOSPITAL_COMMUNITY)
Admission: RE | Admit: 2018-11-28 | Discharge: 2018-11-28 | Disposition: A | Discharge status: Home / Self Care | Payer: 59 | Source: Ambulatory Visit | Attending: Family Medicine | Admitting: Family Medicine

## 2018-11-28 DIAGNOSIS — K7689 Other specified diseases of liver: Secondary | ICD-10-CM | POA: Diagnosis not present

## 2018-12-01 ENCOUNTER — Telehealth: Payer: Self-pay

## 2018-12-01 NOTE — Telephone Encounter (Signed)
Patient informed of [place] results per Dr. Gavin Pound, Gaye Alken, CMA

## 2018-12-01 NOTE — Telephone Encounter (Signed)
Pt is calling back to return call. Pt would like someone to call and discuss results as soon as possible.

## 2018-12-01 NOTE — Telephone Encounter (Signed)
Called patient because she was very upset about her results.  Patient states that if there is no change in her Ultrasound results then why is she constantly taking different imaging and being exposed to further radiation?This is getting too expensive as well.    Patient says that she needs to know what to do going forward, but, that she will not be doing anymore imaging.  I assured patient that I understood her concern and frustration and that I would pass it along to Dr. Tammi Klippel.  Patient was appreciative and verbalized understanding.  Ozella Almond, [place]

## 2018-12-05 ENCOUNTER — Ambulatory Visit: Payer: 59

## 2018-12-05 ENCOUNTER — Ambulatory Visit
Admission: RE | Admit: 2018-12-05 | Discharge: 2018-12-05 | Disposition: A | Discharge status: Home / Self Care | Payer: 59 | Source: Ambulatory Visit | Attending: Internal Medicine | Admitting: Internal Medicine

## 2018-12-05 DIAGNOSIS — R922 Inconclusive mammogram: Secondary | ICD-10-CM | POA: Diagnosis not present

## 2018-12-05 DIAGNOSIS — R928 Other abnormal and inconclusive findings on diagnostic imaging of breast: Secondary | ICD-10-CM

## 2018-12-08 ENCOUNTER — Encounter: Payer: Self-pay | Admitting: Neurology

## 2019-01-27 ENCOUNTER — Other Ambulatory Visit: Payer: Self-pay | Admitting: Family Medicine

## 2019-01-27 DIAGNOSIS — R102 Pelvic and perineal pain: Secondary | ICD-10-CM

## 2019-01-27 MED FILL — ESCITALOPRAM 20 MG TABLET: 20 | 30 days supply | Qty: 30 | Fill #0 | Status: TO

## 2019-01-27 MED FILL — AJOVY 225 MG/1.5ML SOSY: 225 | 30 days supply | Qty: 2 | Fill #0 | Status: TO

## 2019-01-27 MED FILL — LOSARTAN POTASSIUM 50 MG TA: 50 | 30 days supply | Qty: 30 | Fill #0 | Status: TO

## 2019-01-27 MED FILL — NAPROXEN 500 MG TABLET: 500 | 30 days supply | Qty: 60 | Fill #0 | Status: TO

## 2019-01-27 MED FILL — VENTOLIN HFA 90 MCG INHALER: 108 (90 BAS | 16 days supply | Qty: 18 | Fill #0 | Status: TO

## 2019-01-27 MED FILL — ESOMEPRAZOLE MAG DR 40 MG C: 40 | 30 days supply | Qty: 30 | Fill #0 | Status: TO

## 2019-01-27 MED FILL — SPIRIVA RESPIMAT 2.5 MCG IN: 2.5 | 30 days supply | Qty: 4 | Fill #0 | Status: TO

## 2019-01-27 MED FILL — DULERA 200 MCG/5 MCG INH: 200-5 | 30 days supply | Qty: 13 | Fill #0 | Status: TO

## 2019-01-28 MED FILL — GABAPENTIN 300 MG CAPSULE: 300 | 18 days supply | Qty: 90 | Fill #0 | Status: TO

## 2019-01-28 MED FILL — OXYBUTYNIN CL ER 10 MG TAB: 10 | 30 days supply | Qty: 30 | Fill #0 | Status: TO

## 2019-02-23 ENCOUNTER — Telehealth: Payer: Self-pay

## 2019-02-23 NOTE — Telephone Encounter (Signed)
Called patient to discuss upcoming appointment on 02/26/19.  [email or web address]

## 2019-02-25 ENCOUNTER — Telehealth: Payer: Self-pay | Admitting: Internal Medicine

## 2019-02-25 NOTE — Telephone Encounter (Signed)

## 2019-02-26 ENCOUNTER — Telehealth (INDEPENDENT_AMBULATORY_CARE_PROVIDER_SITE_OTHER): Payer: 59 | Admitting: Internal Medicine

## 2019-02-26 ENCOUNTER — Encounter: Payer: Self-pay | Admitting: Internal Medicine

## 2019-02-26 VITALS — BP 145/83 | HR 80 | Ht 61.0 in | Wt 206.4 lb

## 2019-02-26 DIAGNOSIS — R601 Generalized edema: Secondary | ICD-10-CM | POA: Diagnosis not present

## 2019-02-26 DIAGNOSIS — I10 Essential (primary) hypertension: Secondary | ICD-10-CM

## 2019-02-26 NOTE — Patient Instructions (Signed)
Medication Instructions:  Your Physician recommend you continue on your current medication as directed.    If you need a refill on your cardiac medications before your next appointment, please call your pharmacy.   Lab work: None  Testing/Procedures: None  Follow-Up: Your physician recommends that you schedule a follow-up appointment as needed with Dr. Debara Pickett

## 2019-02-26 NOTE — Progress Notes (Signed)
Virtual Visit via Telephone Note   This visit type was conducted due to national recommendations for restrictions regarding the COVID-19 Pandemic (e.g. social distancing) in an effort to limit this patient's exposure and mitigate transmission in our community.  Due to her co-morbid illnesses, this patient is at least at moderate risk for complications without adequate follow up.  This format is felt to be most appropriate for this patient at this time.  The patient did not have access to video technology/had technical difficulties with video requiring transitioning to audio format only (telephone).  All issues noted in this document were discussed and addressed.  No physical exam could be performed with this format.  Please refer to the patient's chart for her  consent to telehealth for [facility].   Evaluation Performed:  Telephone visit  Date:  02/26/2019   ID:  [name], DOB 1966-06-14, MRN [id number]  Patient Location:  [place] [address]  Provider location:   [address], [address] [place], [place] [address]  PCP:  Caroline More, DO  Cardiologist:  No primary care provider on file. Electrophysiologist:  None   Chief Complaint: Swelling  History of Present Illness:    [name] is a 53 y.o. female who presents via audio/video conferencing for a telehealth visit today.  This is a 53 year old female last saw in 2018 for lower extremity edema.  It was felt that she has some degree of venous insufficiency although an echo did show mild diastolic dysfunction.  She was previously on Lasix which has good control of her blood pressure.  Recently she has developed worsening edema and weight gain around December or January.  Her PCP started her on Lasix and ordered an echocardiogram.  The echo showed no change in systolic function with mild diastolic dysfunction and mild left atrial enlargement.  She was also found to have several simple cysts in the liver which  was confirmed by dedicated liver ultrasound.  After speaking with her today she says she has had near resolution of her edema with the diuretic and feels like it is working pretty well.  She is currently only on Lasix 20 mg daily.  It may be that she will need to continue on that long-term.  It does not sound like she needs further work-up of "anasarca".  The patient does not have symptoms concerning for COVID-19 infection (fever, chills, cough, or new SHORTNESS OF BREATH).    Prior CV studies:   The following studies were reviewed today:  Chart review Echocardiogram  PMHx:  Past Medical History:  Diagnosis Date   Allergy    Anemia    Anxiety    Arthritis    all over   Asthma    COPD (chronic obstructive pulmonary disease) ([place])    Depression    Dysrhythmia    Irregular heat w/ asthma episodes and anxiety   GERD (gastroesophageal reflux disease)    Hyperlipidemia    Hypertension    Migraines    PONV (postoperative nausea and vomiting)    Pre-diabetes    Sleep apnea    uses CPAP   Urinary incontinence    frequent urination- tx w/ vesicare   Urinary urgency     Past Surgical History:  Procedure Laterality Date   ABDOMINAL HYSTERECTOMY Bilateral 08/15/2017   Procedure: SUPRACERVICAL HYSTERECTOMY ABDOMINAL W/ BILATERAL SALPINGECTOMY AND LEFT OOPHORECTOMY;  Surgeon: Osborne Oman, MD;  Location: [facility];  Service: Gynecology;  Laterality: Bilateral;  ABLATION     CESAREAN SECTION     x3   DILATION AND CURETTAGE OF UTERUS  05/31/2005   MAB, TAB x 2   HERNIA REPAIR  1990's   HYSTEROSCOPY W/ ENDOMETRIAL ABLATION     LEEP     MAB     x 4   MOUTH SURGERY     teeth extraction   TUBAL LIGATION  11/01/2006   WISDOM TOOTH EXTRACTION      FAMHx:  Family History  Problem Relation Age of Onset   Diabetes Maternal Grandmother    Colon cancer Maternal Grandfather    Anesthesia problems Neg Hx    Headache Neg Hx    Aneurysm Neg Hx     Esophageal cancer Neg Hx    Rectal cancer Neg Hx    Stomach cancer Neg Hx     SOCHx:   reports that she quit smoking about 16 years ago. Her smoking use included cigarettes. She has a 3.75 pack-year smoking history. She has never used smokeless tobacco. She reports current alcohol use. She reports that she does not use drugs.  ALLERGIES:  Allergies  Allergen Reactions   Amitriptyline     Abnormal behavior. Just doesn't feel like normal self    Fluticasone-Salmeterol Other (See Comments)    Thrush even with mouth rinsing; worsens cough   Penicillins Itching and Swelling    Has patient had a PCN reaction causing immediate rash, facial/tongue/throat swelling, SOB or lightheadedness with hypotension: No Has patient had a PCN reaction causing severe rash involving mucus membranes or skin necrosis: No Has patient had a PCN reaction that required hospitalization: No Has patient had a PCN reaction occurring within the last 10 years: Yes If all of the above answers are "NO", then may proceed with Cephalosporin use.    Ciprofloxacin Itching and Rash   Macrobid [Nitrofurantoin Monohyd Macro] Itching   Sulfamethoxazole-Trimethoprim Rash    MEDS:  Current Meds  Medication Sig   acetaminophen-codeine (TYLENOL #3) 300-30 MG tablet Take 1 tablet by mouth every 6 (six) hours as needed for moderate pain.   AJOVY 225 MG/1.5ML SOSY INJECT EVERY 30 DAY INTO THE SKIN   albuterol (PROVENTIL) (2.5 MG/3ML) 0.083% nebulizer solution Take 3 mLs (2.5 mg total) every 4 (four) hours as needed by nebulization.   albuterol (VENTOLIN HFA) 108 (90 Base) MCG/ACT inhaler INHALE 2 PUFFS BY MOUTH EVERY 3-4 HOURS AS NEEDED FOR SHORTNESS OF BREATH\WHEEZING\RECURRENT COUGH   baclofen (LIORESAL) 10 MG tablet Take 1 tablet (10 mg total) by mouth 3 (three) times daily as needed (headaches).   benzonatate (TESSALON PERLES) 100 MG capsule Take 2 capsules (200 mg total) by mouth 3 (three) times daily.   BIOTIN  PO Take 2,000 mg by mouth daily.   cetirizine (ZYRTEC) 10 MG tablet Take 1 tablet (10 mg total) daily by mouth.   diclofenac (VOLTAREN) 75 MG EC tablet Take 1 tablet (75 mg total) by mouth 2 (two) times daily with a meal.   eletriptan (RELPAX) 40 MG tablet Take 1 tablet (40 mg total) by mouth as needed for migraine or headache. May repeat in 2 hours if headache persists or recurs.   escitalopram (LEXAPRO) 20 MG tablet TAKE 1 TABLET BY MOUTH ONCE DAILY   esomeprazole (NEXIUM) 40 MG capsule TAKE 1 CAPSULE BY MOUTH ONCE DAILY   estrogen-methylTESTOSTERone (EEMT HS) 0.625-1.25 MG tablet Take 1 tablet by mouth daily.   fluticasone (FLONASE) 50 MCG/ACT nasal spray PLACE 2 SPRAYS INTO THE NOSE DAILY.  furosemide (LASIX) 20 MG tablet Take 1 tablet (20 mg total) by mouth daily.   gabapentin (NEURONTIN) 300 MG capsule TAKE 1 CAPSULE (300 MG TOTAL) BY MOUTH EVERY 8 (EIGHT) HOURS AS NEEDED (PAIN). CAN TAKE 2 CAPSULES AT NIGHT AS NEEDED FOR HOT FLASHES   HYDROcodone-acetaminophen (NORCO/VICODIN) 5-325 MG tablet Take 1 tablet by mouth every 6 (six) hours as needed.   losartan (COZAAR) 50 MG tablet TAKE 1 TABLET BY MOUTH ONCE DAILY   meloxicam (MOBIC) 15 MG tablet Take 1 tablet (15 mg total) by mouth daily.   methocarbamol (ROBAXIN) 500 MG tablet Take 1 tablet (500 mg total) by mouth 2 (two) times daily.   mometasone-formoterol (DULERA) 200-5 MCG/ACT AERO INHALE 2 PUFFS BY MOUTH 2 TIMES DAILY.   montelukast (SINGULAIR) 10 MG tablet Take 1 tablet (10 mg total) by mouth at bedtime.   Multiple Vitamin (MULTIVITAMIN WITH MINERALS) TABS tablet Take 1 tablet by mouth daily.   naproxen (NAPROSYN) 500 MG tablet TAKE 1 TABLET (500 MG TOTAL) BY MOUTH 2 (TWO) TIMES DAILY AS NEEDED FOR MILD PAIN OR MODERATE PAIN.   NONFORMULARY OR COMPOUNDED ITEM Inject 1 application every 30 (thirty) days into the skin.   ondansetron (ZOFRAN) 4 MG tablet Take 1 tablet (4 mg total) by mouth every 8 (eight) hours as  needed for nausea or vomiting.   oxybutynin (DITROPAN-XL) 10 MG 24 hr tablet TAKE 1 TABLET BY MOUTH AT BEDTIME   pravastatin (PRAVACHOL) 40 MG tablet Take 1 tablet (40 mg total) by mouth daily. If muscle pain may take every other day.   Pyridoxine HCl (VITAMIN B-6 PO) Take 1 tablet by mouth daily. Unknown OTC strength   simvastatin (ZOCOR) 40 MG tablet Take 1 tablet (40 mg total) by mouth at bedtime.   Spacer/Aero-Holding Chambers (AEROCHAMBER MV) inhaler Use as instructed   SUMAtriptan (IMITREX) 50 MG tablet Take 50 mg by mouth every 2 (two) hours as needed for migraine. May repeat in 2 hours if headache persists or recurs.   Tiotropium Bromide Monohydrate (SPIRIVA RESPIMAT) 2.5 MCG/ACT AERS Inhale 2 puffs into the lungs daily.   traMADol (ULTRAM) 50 MG tablet Take 2 tablets (100 mg total) by mouth every 6 (six) hours as needed. (Patient taking differently: Take 100 mg by mouth every 6 (six) hours as needed for moderate pain. )   vitamin C (ASCORBIC ACID) 500 MG tablet Take 500 mg by mouth daily.    Current Facility-Administered Medications for the 02/26/19 encounter (Telemedicine) with Pixie Casino, MD  Medication   0.9 %  sodium chloride infusion     ROS: Pertinent items noted in HPI and remainder of comprehensive ROS otherwise negative.  Labs/Other Tests and Data Reviewed:    Recent Labs: 02/27/2018: BNP 5.2 11/07/2018: ALT 23; BUN 13; Creatinine, Ser 0.85; Hemoglobin 13.0; Platelets 287; Potassium 4.0; Sodium 141; TSH 2.330   Recent Lipid Panel Lab Results  Component Value Date/Time   CHOL 225 (H) 11/07/2018 02:58 PM   TRIG 132 11/07/2018 02:58 PM   HDL 59 11/07/2018 02:58 PM   CHOLHDL 3.8 11/07/2018 02:58 PM   CHOLHDL 3.9 08/16/2015 11:06 AM   LDLCALC 140 (H) 11/07/2018 02:58 PM    Wt Readings from Last 3 Encounters:  02/26/19 206 lb 6.4 oz (93.6 kg)  11/07/18 209 lb (94.8 kg)  11/05/18 210 lb 6.4 oz (95.4 kg)     Exam:    Vital Signs:  BP (!) 145/83     Pulse 80    Ht 5'  1" (1.549 m)    Wt 206 lb 6.4 oz (93.6 kg)    LMP 05/21/2017 (Within Weeks)    SpO2 98%    BMI 39.00 kg/m    Exam not performed due to telephone visit  ASSESSMENT & PLAN:    1. Edema 2. Mild diastolic dysfunction  [name] has recurrent edema which is responded to low-dose diuretics.  She has mild diastolic dysfunction on echo but again I do not feel that this is diastolic heart failure.  She probably has some venous hypertension although ultrasound did not suggest significant venous reflux in the past.  I would recommend she remain on low-dose Lasix as it has been beneficial for her, and I am happy to see her back on an as-needed basis.  COVID-19 Education: The signs and symptoms of COVID-19 were discussed with the patient and how to seek care for testing (follow up with PCP or arrange E-visit).  The importance of social distancing was discussed today.  Patient Risk:   After full review of this patients clinical status, I feel that they are at least moderate risk at this time.  Time:   Today, I have spent 15 minutes with the patient with telehealth technology discussing edema.     Medication Adjustments/Labs and Tests Ordered: Current medicines are reviewed at length with the patient today.  Concerns regarding medicines are outlined above.   Tests Ordered: No orders of the defined types were placed in this encounter.   Medication Changes: No orders of the defined types were placed in this encounter.   Disposition:  prn  Pixie Casino, MD, FACC, [place] Director of the [facility] Diplomate of the American Board of Clinical Lipidology Attending Cardiologist  Direct Dial: [phone]   Fax: [phone]  Website:  [email or web address]  Pixie Casino, MD  02/26/2019 1:40 PM

## 2019-03-03 ENCOUNTER — Encounter: Payer: Self-pay | Admitting: Family Medicine

## 2019-03-03 ENCOUNTER — Other Ambulatory Visit: Payer: Self-pay

## 2019-03-03 ENCOUNTER — Ambulatory Visit (INDEPENDENT_AMBULATORY_CARE_PROVIDER_SITE_OTHER): Payer: 59 | Admitting: Family Medicine

## 2019-03-03 ENCOUNTER — Other Ambulatory Visit (HOSPITAL_COMMUNITY)
Admission: RE | Admit: 2019-03-03 | Discharge: 2019-03-03 | Disposition: A | Discharge status: Home / Self Care | Payer: 59 | Source: Ambulatory Visit | Attending: Family Medicine | Admitting: Family Medicine

## 2019-03-03 VITALS — BP 146/82 | HR 78 | Wt 206.4 lb

## 2019-03-03 DIAGNOSIS — Z202 Contact with and (suspected) exposure to infections with a predominantly sexual mode of transmission: Secondary | ICD-10-CM | POA: Diagnosis not present

## 2019-03-03 DIAGNOSIS — R300 Dysuria: Secondary | ICD-10-CM | POA: Diagnosis not present

## 2019-03-03 LAB — POCT URINALYSIS DIP (MANUAL ENTRY)
Bilirubin, UA: NEGATIVE
Blood, UA: NEGATIVE
Glucose, UA: NEGATIVE mg/dL
Ketones, POC UA: NEGATIVE mg/dL
Leukocytes, UA: NEGATIVE
Nitrite, UA: NEGATIVE
Protein Ur, POC: NEGATIVE mg/dL
Spec Grav, UA: 1.02 (ref 1.010–1.025)
Urobilinogen, UA: 0.2 E.U./dL
pH, UA: 6 (ref 5.0–8.0)

## 2019-03-03 LAB — POCT WET PREP (WET MOUNT)
Clue Cells Wet Prep Whiff POC: NEGATIVE
Trichomonas Wet Prep HPF POC: ABSENT

## 2019-03-03 MED ORDER — PHENAZOPYRIDINE HCL 200 MG PO TABS: 200.0000 mg | ORAL_TABLET | Freq: Three times a day (TID) | ORAL | 0 refills | Status: DC | PRN

## 2019-03-03 MED ORDER — CEPHALEXIN 500 MG PO CAPS: 500.0000 mg | ORAL_CAPSULE | Freq: Two times a day (BID) | ORAL | 0 refills | Status: AC

## 2019-03-03 MED FILL — CEPHALEXIN 500 MG CAPSULE: 500 | 7 days supply | Qty: 14 | Fill #0

## 2019-03-03 MED FILL — PHENAZOPYRIDINE 200 MG TAB: 200 | 3 days supply | Qty: 10 | Fill #0

## 2019-03-03 NOTE — Patient Instructions (Signed)
Urinary Tract Infection, Adult A urinary tract infection (UTI) is an infection of any part of the urinary tract. The urinary tract includes:  The kidneys.  The ureters.  The bladder.  The urethra. These organs make, store, and get rid of pee (urine) in the body. What are the causes? This is caused by germs (bacteria) in your genital area. These germs grow and cause swelling (inflammation) of your urinary tract. What increases the risk? You are more likely to develop this condition if:  You have a small, thin tube (catheter) to drain pee.  You cannot control when you pee or poop (incontinence).  You are female, and: ? You use these methods to prevent pregnancy: ? A medicine that kills sperm (spermicide). ? A device that blocks sperm (diaphragm). ? You have low levels of a female hormone (estrogen). ? You are pregnant.  You have genes that add to your risk.  You are sexually active.  You take antibiotic medicines.  You have trouble peeing because of: ? A prostate that is bigger than normal, if you are female. ? A blockage in the part of your body that drains pee from the bladder (urethra). ? A kidney stone. ? A nerve condition that affects your bladder (neurogenic bladder). ? Not getting enough to drink. ? Not peeing often enough.  You have other conditions, such as: ? Diabetes. ? A weak disease-fighting system (immune system). ? Sickle cell disease. ? Gout. ? Injury of the spine. What are the signs or symptoms? Symptoms of this condition include:  Needing to pee right away (urgently).  Peeing often.  Peeing small amounts often.  Pain or burning when peeing.  Blood in the pee.  Pee that smells bad or not like normal.  Trouble peeing.  Pee that is cloudy.  Fluid coming from the vagina, if you are female.  Pain in the belly or lower back. Other symptoms include:  Throwing up (vomiting).  No urge to eat.  Feeling mixed up (confused).  Being tired  and grouchy (irritable).  A fever.  Watery poop (diarrhea). How is this treated? This condition may be treated with:  Antibiotic medicine.  Other medicines.  Drinking enough water. Follow these instructions at home:  Medicines  Take over-the-counter and prescription medicines only as told by your doctor.  If you were prescribed an antibiotic medicine, take it as told by your doctor. Do not stop taking it even if you start to feel better. General instructions  Make sure you: ? Pee until your bladder is empty. ? Do not hold pee for a long time. ? Empty your bladder after sex. ? Wipe from front to back after pooping if you are a female. Use each tissue one time when you wipe.  Drink enough fluid to keep your pee pale yellow.  Keep all follow-up visits as told by your doctor. This is important. Contact a doctor if:  You do not get better after 1-2 days.  Your symptoms go away and then come back. Get help right away if:  You have very bad back pain.  You have very bad pain in your lower belly.  You have a fever.  You are sick to your stomach (nauseous).  You are throwing up. Summary  A urinary tract infection (UTI) is an infection of any part of the urinary tract.  This condition is caused by germs in your genital area.  There are many risk factors for a UTI. These include having a small, thin   tube to drain pee and not being able to control when you pee or poop.  Treatment includes antibiotic medicines for germs.  Drink enough fluid to keep your pee pale yellow. This information is not intended to replace advice given to you by your health care provider. Make sure you discuss any questions you have with your health care provider. Document Released: 04/02/2008 Document Revised: 04/24/2018 Document Reviewed: 04/24/2018 Elsevier Interactive Patient Education  2019 Elsevier Inc.  

## 2019-03-03 NOTE — Progress Notes (Addendum)
   Subjective:    Patient ID: [name], female    DOB: 04/25/1966, 53 y.o.   MRN: [id number]   CC: dysuria  HPI: Dysuria Patient reports dysuria since Saturday.  Reports that it feels like burning.  Reports that pain eases off after urination is complete.  Also reports urgency and frequency x2 days.  Did have one episode of back pain but that resolved.  Reports color change of urine.  Reports odor change.  Denies any fevers.  Has been using a new shower gel that she reports this feels like her other urinary tract infections.  Is somewhat concerned about sexually transmitted diseases, patient has been with same partner for 12 years but recently ended this relationship.   Objective:  BP (!) 146/82   Pulse 78   Wt 206 lb 6.4 oz (93.6 kg)   LMP 05/21/2017 (Within Weeks)   SpO2 98%   BMI 39.00 kg/m  Vitals and nursing note reviewed  General: well nourished, in no acute distress HEENT: normocephalic, no scleral icterus or conjunctival pallor, no nasal discharge, moist mucous membranes, good dentition without erythema or discharge noted in posterior oropharynx Cardiac: RRR, clear S1 and S2, no murmurs, rubs, or gallops Respiratory: clear to auscultation bilaterally, no increased work of breathing Abdomen: soft, nontender, nondistended, no masses or organomegaly. Bowel sounds present Extremities: no edema or cyanosis. Warm, well perfused.  Skin: warm and dry, no rashes noted Neuro: alert and oriented, no focal deficits MSK: no CVA tenderness Female GU: normal exterior exam, speculum exam deferred per patient preference   Assessment & Plan:    Dysuria UA negative however patient has history of negative UA with positive urine culture.  We will plan to treat empirically with Keflex, patient states that this antibiotic has worked for her in the past.  Patient has allergy to penicillin but clarified with patient and states that she has taken Keflex in the past and had no allergic  reactions to this.  Pyridium given to help with dysuria.  Urine culture obtained.  Follow-up if no improvement.  Wet prep and gonorrhea and chlamydia swabs obtained per patient's request.  HIV and RPR testing obtained.    Return if symptoms worsen or fail to improve.   Caroline More, DO, PGY-2

## 2019-03-04 ENCOUNTER — Telehealth: Payer: Self-pay | Admitting: *Deleted

## 2019-03-04 LAB — HIV ANTIBODY (ROUTINE TESTING W REFLEX): HIV Screen 4th Generation wRfx: NONREACTIVE

## 2019-03-04 LAB — RPR: RPR Ser Ql: NONREACTIVE

## 2019-03-04 NOTE — Assessment & Plan Note (Addendum)
UA negative however patient has history of negative UA with positive urine culture.  We will plan to treat empirically with Keflex, patient states that this antibiotic has worked for her in the past.  Patient has allergy to penicillin but clarified with patient and states that she has taken Keflex in the past and had no allergic reactions to this.  Pyridium given to help with dysuria.  Urine culture obtained.  Follow-up if no improvement.  Wet prep and gonorrhea and chlamydia swabs obtained per patient's request.  HIV and RPR testing obtained.

## 2019-03-04 NOTE — Telephone Encounter (Signed)
-----   Message from Caroline More, DO sent at 03/03/2019  5:38 PM EDT ----- Please inform patient that results are negative.

## 2019-03-04 NOTE — Telephone Encounter (Signed)
Pt informed. Linnell Swords, CMA  

## 2019-03-04 NOTE — Telephone Encounter (Signed)
Pt informed. Deseree Blount, CMA  

## 2019-03-04 NOTE — Telephone Encounter (Signed)
-----   Message from Caroline More, DO sent at 03/04/2019  8:27 AM EDT ----- Please inform patient that results are negative.

## 2019-03-05 LAB — URINE CULTURE

## 2019-03-05 LAB — CERVICOVAGINAL ANCILLARY ONLY
Chlamydia: NEGATIVE
Neisseria Gonorrhea: NEGATIVE

## 2019-03-06 ENCOUNTER — Telehealth: Payer: Self-pay

## 2019-03-06 NOTE — Telephone Encounter (Signed)
Pt called back and was informed. Jessica Fleeger, CMA  

## 2019-03-06 NOTE — Telephone Encounter (Signed)
Attempted to call patient to inform of negative results for gonorrhea and chlamydia tests.   There was no answer. HIPPA compliant message left for patient to call office.  Ozella Almond, [place]

## 2019-03-13 DIAGNOSIS — J45901 Unspecified asthma with (acute) exacerbation: Secondary | ICD-10-CM | POA: Diagnosis not present

## 2019-03-13 DIAGNOSIS — G4733 Obstructive sleep apnea (adult) (pediatric): Secondary | ICD-10-CM | POA: Diagnosis not present

## 2019-03-19 ENCOUNTER — Telehealth: Payer: Self-pay | Admitting: *Deleted

## 2019-03-19 ENCOUNTER — Encounter: Payer: Self-pay | Admitting: *Deleted

## 2019-03-19 NOTE — Telephone Encounter (Signed)
Spoke with patient about her 6/3 appt and discussed that d/t covid 19 pandemic, our office is severely reducing in person visits in order to minimize the risk to our patients and healthcare providers. Advised that we recommend patient convert to a telemedicine visit. Pt understands there are limitations with the physical assessment in telemedicine. Pt understands the visit will be treated like an in-office visit and we will file with her insurance, and there may be a patient responsible charge related to this service. Pt asked for a telephone visit since her computer is not working. She confirmed her name & DOB and provided updates to her chart. No changes to medical history. Medications updated. Pt will keep the same time and agreed to see Amy NP for this visit. She understands she will receive a call 30 minutes prior to "check-in" followed by Amy's call at 10:30 give or take a few min. She verbalized appreciation for the call.   Appt changed to Amy NP @ 10:30 on 04/01/2019.

## 2019-03-25 ENCOUNTER — Ambulatory Visit: Payer: 59 | Admitting: Neurology

## 2019-03-26 ENCOUNTER — Other Ambulatory Visit: Payer: Self-pay | Admitting: Family Medicine

## 2019-03-26 ENCOUNTER — Other Ambulatory Visit: Payer: Self-pay | Admitting: Obstetrics & Gynecology

## 2019-03-26 DIAGNOSIS — R232 Flushing: Secondary | ICD-10-CM

## 2019-03-26 DIAGNOSIS — Z90711 Acquired absence of uterus with remaining cervical stump: Secondary | ICD-10-CM

## 2019-03-26 MED FILL — GABAPENTIN 300 MG CAPSULE: 300 | 18 days supply | Qty: 90 | Fill #0

## 2019-03-26 MED FILL — SPIRIVA RESPIMAT 2.5 MCG IN: 2.5 | 30 days supply | Qty: 4 | Fill #0

## 2019-03-26 MED FILL — DULERA 200 MCG/5 MCG INH: 200-5 | 30 days supply | Qty: 13 | Fill #0

## 2019-03-26 MED FILL — AJOVY 225 MG/1.5ML SOSY: 225 | 30 days supply | Qty: 2 | Fill #0

## 2019-03-26 MED FILL — LOSARTAN POTASSIUM 50 MG TA: 50 | 30 days supply | Qty: 30 | Fill #0

## 2019-03-26 MED FILL — ESOMEPRAZOLE MAG DR 40 MG C: 40 | 30 days supply | Qty: 30 | Fill #0

## 2019-03-26 MED FILL — ESCITALOPRAM 20 MG TABLET: 20 | 30 days supply | Qty: 30 | Fill #0

## 2019-03-26 MED FILL — ALBUTEROL SULFATE HFA 108 (: 108 (90 BAS | 16 days supply | Qty: 18 | Fill #0

## 2019-03-26 MED FILL — NAPROXEN 500 MG TABLET: 500 | 30 days supply | Qty: 60 | Fill #0

## 2019-03-26 MED FILL — OXYBUTYNIN CL ER 10 MG TAB: 10 | 30 days supply | Qty: 30 | Fill #0

## 2019-03-30 ENCOUNTER — Other Ambulatory Visit: Payer: Self-pay | Admitting: Obstetrics & Gynecology

## 2019-03-30 DIAGNOSIS — R232 Flushing: Secondary | ICD-10-CM

## 2019-03-30 DIAGNOSIS — Z90711 Acquired absence of uterus with remaining cervical stump: Secondary | ICD-10-CM

## 2019-03-31 MED FILL — EEMT HS 0.625-1.25 MG TAB: 0.625-1.25 | 90 days supply | Qty: 90 | Fill #0

## 2019-04-01 ENCOUNTER — Telehealth: Payer: Self-pay

## 2019-04-01 ENCOUNTER — Other Ambulatory Visit: Payer: Self-pay

## 2019-04-01 ENCOUNTER — Encounter: Payer: Self-pay | Admitting: Family Medicine

## 2019-04-01 ENCOUNTER — Ambulatory Visit: Payer: 59 | Admitting: Neurology

## 2019-04-01 ENCOUNTER — Ambulatory Visit (INDEPENDENT_AMBULATORY_CARE_PROVIDER_SITE_OTHER): Payer: 59 | Admitting: Family Medicine

## 2019-04-01 DIAGNOSIS — G43709 Chronic migraine without aura, not intractable, without status migrainosus: Secondary | ICD-10-CM

## 2019-04-01 MED ORDER — ELETRIPTAN HYDROBROMIDE 40 MG PO TABS: 40.0000 mg | ORAL_TABLET | ORAL | 11 refills | Status: DC | PRN

## 2019-04-01 MED ORDER — ONDANSETRON HCL 4 MG PO TABS: 4.0000 mg | ORAL_TABLET | Freq: Three times a day (TID) | ORAL | 0 refills | Status: DC | PRN

## 2019-04-01 MED FILL — ONDANSETRON HCL 4 MG TABLET: 4 | 2 days supply | Qty: 6 | Fill #0

## 2019-04-01 NOTE — Telephone Encounter (Signed)
She has tried both imitrex and maxalt in the past..per our convo today. She has been prescribed relpax in the past I think. Let me know what we need to do to get it for her.

## 2019-04-01 NOTE — Telephone Encounter (Signed)
Prescription was printed out instead of e-scribed. Per Amy it is ok to send over electronically to the patient's pharmacy.

## 2019-04-01 NOTE — Progress Notes (Signed)
PATIENT: [name] DOB: 18-Dec-1965  REASON FOR VISIT: follow up HISTORY FROM: patient  Virtual Visit via Telephone Note  I connected with [name] on 04/01/19 at 10:30 AM EDT by telephone and verified that I am speaking with the correct person using two identifiers.   I discussed the limitations, risks, security and privacy concerns of performing an evaluation and management service by telephone and the availability of in person appointments. I also discussed with the patient that there may be a patient responsible charge related to this service. The patient expressed understanding and agreed to proceed.   History of Present Illness:  04/01/19 [name] is a 52 y.o. female here today for follow up of migraines. She continues Ajovy for prevention and Imitrex for acute management. She is also prescribed gabapentin for pain and hot flashes as well as naproxen for pain.  She reports that she was late getting this month's dose of Ajovy due to pharmacy delay.  She has noted an increased frequency of her headaches this past week.  She has a migraine today that is been present for the past 3 days.  She has not taking any abortive therapy due to not having any.  She reports that this is her typical migraine.  She denies any new finding on.  History (copied from [name] note on 03/19/2018)  Interval history 03/19/2018: The Ajovy has helped with the headaches she likes it. She has had a great response. 2 migraines in April lasted 45 minutes. Most of the month she had no headaches she had some but not severe. Feeling great. Will continue Ajovy. Discussed botox and other treatments.  Interval History 09/10/2017: At last appointment ordered MRIs of the brain and orbits, they were not completed. Also ordered physical therapy but finished only 2/13 visits. Had surgery 08/15/2017. hysterectomy, Bilateral salpingectomy, left oopherectomy. She can't afford MRI or PT. Started Aimovig, but she could  not get it. Will try Ajovy, will give a copay ard.   HPI:Kendra M Gloveris a 53 y.o.femalehere as a referral from [name] migraines.Past medical history of migraines, high blood pressure, anxiety, depression, asthma. Headaches wake her up in the morning. Migraines since the age of 46. Worse this year, she was taking care of her grandmother and passed away and there has been stress and a bad relationship. She has had migraine for days after getting a flu shot. She was a patient of the [facility]. They start at the back of there head and spread all ove rthe head, she has tenderness of the skin, She has a prodrome feels the pain in the neck. For the last year. She has light and sound sensitivity, it is pulsating and throbbing and pounding with nausea and vomiting and also has vision changes and episodic vision loss unknown eye. Migraines can last weeks, has had one for weeks now, she was in the ED last Saturday, she is missing work. Sleeping in a dark room helps. Stress worsens it. Woke up with a headache again this morning and she has neck pain and neck tightness. Reports daily headaches and at least 15 are migrainous for the last 9 months. She has allergies as well. No medication overuse. No aura. No other focal neurologic deficits, associated symptoms, inciting events or modifiable factors.Lots of stress.  Medications tried include: Baclofen, Imitrex, Lexapro, gabapentin, losartan, naproxen, Zofran, propranolol, tramadol.She tried amitriptyline and has an allergy to it, zomig  Reviewed notes, labs and imaging from outside  physicians, which showed:  presentedthe emergency room for evaluation of intermittent, generalized headache for the past 4 days on 07/20/2017. Patient had associated dizziness and feeling off balance with nausea. The headache began in the back of the head and radiated to the front. Similar to previous migraines. She tried ibuprofen. The past she received  several Botox injections with provided her with 1-2 years relief in her migraines. Denied any numbness, fevers, chills, headache injury, falls, blood thinner use. She was given Compazine, Benadryl and IV fluids in the emergency room. Symptoms reviewed and she was discharged.   Observations/Objective:  Generalized: Well developed, in no acute distress  Mentation: Alert oriented to time, place, history taking. Follows all commands speech and language fluent   Assessment and Plan:  53 y.o. year old female  has a past medical history of Allergy, Anemia, Anxiety, Arthritis, Asthma, COPD (chronic obstructive pulmonary disease) ([place]), Depression, Dysrhythmia, GERD (gastroesophageal reflux disease), Hyperlipidemia, Hypertension, Migraines, PONV (postoperative nausea and vomiting), Pre-diabetes, Sleep apnea, Urinary incontinence, and Urinary urgency. here with    ICD-10-CM   1. Chronic migraine without aura without status migrainosus, not intractable G43.709    [name] was doing well with Ajovy injections until recently.  She had a missed dose this month.  She has rectified the situation and took her dose last week.  Unfortunately, she does have a migraine today that is been present for 3 days.  I will call in Relpax for abortive therapy.  I have also advised that she take an extra dose of gabapentin and 1 tablet of naproxen with her Relpax.  I will also call in Zofran as needed for nausea.  She was instructed to call if headache is not resolved in 48 hours.  She was encouraged to get adequate hydration.  Rest and relaxation encouraged.  She verbalizes understanding and agreement with this plan.  She will follow-up in 1 year, sooner if needed.  No orders of the defined types were placed in this encounter.   No orders of the defined types were placed in this encounter.    Follow Up Instructions:  I discussed the assessment and treatment plan with the patient. The patient was provided an  opportunity to ask questions and all were answered. The patient agreed with the plan and demonstrated an understanding of the instructions.   The patient was advised to call back or seek an in-person evaluation if the symptoms worsen or if the condition fails to improve as anticipated.  I provided 25 minutes of non-face-to-face time during this encounter.  Patient is located at her place of residence during teleconference.  Provider is located at her place of residence.  Maryelizabeth Kaufmann, CMA helped to facilitate visit.   Debbora Presto, NP

## 2019-04-01 NOTE — Progress Notes (Signed)
Made any corrections needed, and agree with history, physical, neuro exam,assessment and plan as stated.     Raenell Mensing, MD [facility]  

## 2019-04-01 NOTE — Telephone Encounter (Signed)
Received paperwork from [facility] stating that she needs to have a trial of Sumatriptan and Rizatriptan before she can start Eletriptan. I faxed back to the pharmacy all the medications she has tried and failed. Confirmation fax has been received.  Medications tried include: Baclofen, Imitrex, Lexapro, gabapentin, losartan, naproxen, Zofran, propranolol, tramadol.She tried amitriptyline and has an allergy to it, zomig

## 2019-04-06 ENCOUNTER — Telehealth: Payer: Self-pay

## 2019-04-06 NOTE — Telephone Encounter (Signed)
Pending renewal of Ajovy 225mg /1.5 mL syringes Key: [id number]  ICD 10 codes: T91.504 and G43.701 PA Case ID: [id number]  I will update once a decision has been made.

## 2019-04-07 NOTE — Telephone Encounter (Signed)
Ajovy was approved 04-06-2019 through 04-04-2020. 1.5 mL per 30 days. Approval letter has been faxed to the patients pharmacy listed below. Confirmation fax has been received.    [place], [place] - [address] [phone] (Phone) [phone] (Fax)

## 2019-04-21 ENCOUNTER — Ambulatory Visit (INDEPENDENT_AMBULATORY_CARE_PROVIDER_SITE_OTHER): Payer: 59 | Admitting: Internal Medicine

## 2019-04-21 DIAGNOSIS — J45909 Unspecified asthma, uncomplicated: Secondary | ICD-10-CM

## 2019-04-21 DIAGNOSIS — G4733 Obstructive sleep apnea (adult) (pediatric): Secondary | ICD-10-CM

## 2019-04-21 DIAGNOSIS — J4551 Severe persistent asthma with (acute) exacerbation: Secondary | ICD-10-CM | POA: Diagnosis not present

## 2019-04-21 NOTE — Progress Notes (Signed)
* [facility]   Virtual Visit via Telephone Note I connected with patient on 04/21/19 at  3:45 PM EDT by telephone and verified that I am speaking with the correct person using two identifiers.   I discussed the limitations, risks of performing an evaluation and management service by telephone and the availability of in person appointments. I also discussed with the patient that there may be a patient responsible charge related to this service.  In light of current covid-19 pandemic, patient also understands that we are trying to protect them by minimizing in office contact if at all possible.  The patient expressed understanding and agreed to proceed. Please see note below for further detail.    The patient was advised to call back or seek an in-person evaluation if the symptoms worsen or if the condition fails to improve as anticipated.   Laverle Hobby, MD    Assessment and Plan:  Obstructive sleep apnea. - Poor compliance recently due to recent sinus infections.  She is subsequently having increased sleepiness during the day and increased overall daytime fatigue. - Encouraged her to get back to using CPAP nightly.  Circadian rhythm disorder/shift work sleep disorder. -Patient works an overnight shift, this may be contributing to her daytime sleepiness. -Recommend continued use of daily CPAP.  Excessive daytime sleepiness. -Multifactorial from sedating medication, OSA, shift work disorder.   Asthma with chronic bronchitis. --Has continued symptoms with known triggers of exercise, such as walking in from the parking lot and rain.  Continue to use nebulizer as needed. --Continue Dulera 2 puffs twice daily, Spiriva once daily.   Return in about 9 months (around 01/19/2020).   Date: 04/21/2019  MRN# [id number] [name] 1966-05-09   [name] is a 53 y.o. old female seen in follow up for chief complaint of sleep apnea.     HPI:  [name]  is a 53 y.o. female with asthma, mild OSA, on CPAP 5-20 cm H2O, also with shift workers disorder. At last visit she was not using CPAP regularly. At last visit she was advised to Continue dulera, spiriva, singulair, zyrtec, nebs, albuterol mdi and use cpap regularly.  Since her last visit she had a sinus infection and this has been making it hard to use her CPAP. She is going to start using it again this week because has been getting more tired during the day.  She feels that her breathing has been doing ok. She has use a neb when she walks in from the parking lot in to her job. She is using dulera 2 puffs twice per day, spiriva once per day. She uses albuterol MDI when she has known trigger such as when it rains.   She previous visits she was noted to speak with her eyes closed.     **Download data 07/22/2016-08/20/2016>> raw data personally reviewed.  Usage greater than 4 hours is 13/30 days.  Average usage on days used is 4 hours 20 minutes.  Pressure ranges 5-20 on auto CPAP.  Median pressure is 8, 95th percentile pressure is 10, maximum pressure is 12.  Residual AHI 0.7.  Overall this shows poor compliance with excellent control of obstructive sleep apnea when used. **Download data personally reviewed, 3 days as of 08/20/2016.  Usage greater than 4 hours is 13/30 days.  Average usage on days used is 4 hours 20 minutes.  Pressure ranges 5-20.  Median pressure 7, 95th percentile pressure is 10, maximum pressure is 11.  Residual AHI is 0.7.  While this test shows suboptimal use of CPAP with excellent control of obstructive sleep apnea when used. **Download data 9/24-10/23/17:23/30 days; greater than 4 hours equals 13 days. 43%. Average usage is 4 hours 20 minutes. Onset 5-20; 95th percentile pressure is 10, residual AHI 0.7.  She takes nexium once in am. She used to get severe "strangling" if she eats close to bed time which improved when she stopped eating close to bedtime.   They have a dog but not  in her bedroom.    Medication:   Outpatient Encounter Medications as of 04/21/2019  Medication Sig  . acetaminophen-codeine (TYLENOL #3) 300-30 MG tablet Take 1 tablet by mouth every 6 (six) hours as needed for moderate pain.  Marland Kitchen AJOVY 225 MG/1.5ML SOSY INJECT EVERY 30 DAY INTO THE SKIN  . albuterol (PROVENTIL) (2.5 MG/3ML) 0.083% nebulizer solution Take 3 mLs (2.5 mg total) every 4 (four) hours as needed by nebulization.  Marland Kitchen albuterol (VENTOLIN HFA) 108 (90 Base) MCG/ACT inhaler INHALE 2 PUFFS BY MOUTH EVERY 3-4 HOURS AS NEEDED FOR SHORTNESS OF BREATH\WHEEZING\RECURRENT COUGH  . baclofen (LIORESAL) 10 MG tablet Take 1 tablet (10 mg total) by mouth 3 (three) times daily as needed (headaches).  . benzonatate (TESSALON PERLES) 100 MG capsule Take 2 capsules (200 mg total) by mouth 3 (three) times daily.  Marland Kitchen BIOTIN PO Take 2,000 mg by mouth daily.  . cetirizine (ZYRTEC) 10 MG tablet Take 1 tablet (10 mg total) daily by mouth.  . diclofenac (VOLTAREN) 75 MG EC tablet Take 1 tablet (75 mg total) by mouth 2 (two) times daily with a meal.  . EEMT HS 0.625-1.25 MG tablet TAKE 1 TABLET BY MOUTH DAILY.  Marland Kitchen eletriptan (RELPAX) 40 MG tablet Take 1 tablet (40 mg total) by mouth as needed for migraine or headache. May repeat in 2 hours if headache persists or recurs.  Marland Kitchen escitalopram (LEXAPRO) 20 MG tablet TAKE 1 TABLET BY MOUTH ONCE DAILY (Patient taking differently: Take 20 mg by mouth at bedtime. )  . esomeprazole (NEXIUM) 40 MG capsule TAKE 1 CAPSULE BY MOUTH ONCE DAILY  . fluticasone (FLONASE) 50 MCG/ACT nasal spray PLACE 2 SPRAYS INTO THE NOSE DAILY.  . furosemide (LASIX) 20 MG tablet Take 1 tablet (20 mg total) by mouth daily.  Marland Kitchen gabapentin (NEURONTIN) 300 MG capsule TAKE 1 CAPSULE (300 MG TOTAL) BY MOUTH EVERY 8 (EIGHT) HOURS AS NEEDED (PAIN). CAN TAKE 2 CAPSULES AT NIGHT AS NEEDED FOR HOT FLASHES  . losartan (COZAAR) 50 MG tablet TAKE 1 TABLET BY MOUTH ONCE DAILY  . mometasone-formoterol (DULERA) 200-5  MCG/ACT AERO INHALE 2 PUFFS BY MOUTH 2 TIMES DAILY.  . montelukast (SINGULAIR) 10 MG tablet Take 1 tablet (10 mg total) by mouth at bedtime.  . Multiple Vitamin (MULTIVITAMIN WITH MINERALS) TABS tablet Take 1 tablet by mouth daily.  . naproxen (NAPROSYN) 500 MG tablet TAKE 1 TABLET (500 MG TOTAL) BY MOUTH 2 (TWO) TIMES DAILY AS NEEDED FOR MILD PAIN OR MODERATE PAIN.  Marland Kitchen ondansetron (ZOFRAN) 4 MG tablet Take 1 tablet (4 mg total) by mouth every 8 (eight) hours as needed for nausea or vomiting.  Marland Kitchen oxybutynin (DITROPAN-XL) 10 MG 24 hr tablet TAKE 1 TABLET BY MOUTH AT BEDTIME  . phenazopyridine (PYRIDIUM) 200 MG tablet Take 1 tablet (200 mg total) by mouth 3 (three) times daily as needed for pain.  . pravastatin (PRAVACHOL) 40 MG tablet Take 1 tablet (40 mg total) by mouth daily. If muscle pain  may take every other day.  . Pyridoxine HCl (VITAMIN B-6 PO) Take 1 tablet by mouth daily. Unknown OTC strength  . Spacer/Aero-Holding Chambers (AEROCHAMBER MV) inhaler Use as instructed  . SUMAtriptan (IMITREX) 50 MG tablet Take 50 mg by mouth every 2 (two) hours as needed for migraine. May repeat in 2 hours if headache persists or recurs.  . Tiotropium Bromide Monohydrate (SPIRIVA RESPIMAT) 2.5 MCG/ACT AERS Inhale 2 puffs into the lungs daily.  . vitamin C (ASCORBIC ACID) 500 MG tablet Take 500 mg by mouth daily.   . [DISCONTINUED] medroxyPROGESTERone (DEPO-PROVERA) 150 MG/ML injection Inject 1 mL (150 mg total) into the muscle every 3 (three) months.  . [DISCONTINUED] medroxyPROGESTERone (PROVERA) 10 MG tablet Take 2 tablets (20 mg total) by mouth daily.   Facility-Administered Encounter Medications as of 04/21/2019  Medication  . 0.9 %  sodium chloride infusion     Allergies:  Amitriptyline, Fluticasone-salmeterol, Penicillins, Ciprofloxacin, Macrobid [nitrofurantoin monohyd macro], and Sulfamethoxazole-trimethoprim  Review of Systems:  Constitutional: Feels well. Cardiovascular: Denies chest pain,  exertional chest pain.  Pulmonary: Denies hemoptysis, pleuritic chest pain.   The remainder of systems were reviewed and were found to be negative other than what is documented in the HPI.       LABORATORY PANEL:   CBC No results for input(s): WBC, HGB, HCT, PLT in the last 168 hours. ------------------------------------------------------------------------------------------------------------------  Chemistries  No results for input(s): NA, K, CL, CO2, GLUCOSE, BUN, CREATININE, CALCIUM, MG, AST, ALT, ALKPHOS, BILITOT in the last 168 hours.  Invalid input(s): GFRCGP ------------------------------------------------------------------------------------------------------------------  Cardiac Enzymes No results for input(s): TROPONINI in the last 168 hours. ------------------------------------------------------------  RADIOLOGY:   No results found for this or any previous visit. Results for orders placed during the hospital encounter of 11/28/15  DG Chest 2 View   Narrative CLINICAL DATA:  Right-sided chest pain for 3 days no fever  EXAM: CHEST  2 VIEW  COMPARISON:  08/17/2015  FINDINGS: Cardiac silhouette upper normal in size but stable. Vascular pattern normal. Lungs clear. No effusions.  IMPRESSION: No active cardiopulmonary disease.   Electronically Signed   By: Skipper Cliche M.D.   On: 11/28/2015 12:37    ------------------------------------------------------------------------------------------------------------------  Thank  you for allowing [facility] to assist in the care of your patient. Our recommendations are noted above.  Please contact us if we can be of further service.  Marda Stalker, M.D., F.C.C.P.  Board Certified in Internal Medicine, Pulmonary Medicine, [place], and Sleep Medicine.  [facility] Office Number: [phone]   04/21/2019

## 2019-04-21 NOTE — Patient Instructions (Signed)
Continue to use current inhalers.  Use cpap every night for the whole night.

## 2019-04-22 ENCOUNTER — Ambulatory Visit: Payer: 59 | Admitting: Internal Medicine

## 2019-04-27 NOTE — Telephone Encounter (Signed)
Opened in error

## 2019-05-07 ENCOUNTER — Ambulatory Visit (INDEPENDENT_AMBULATORY_CARE_PROVIDER_SITE_OTHER): Payer: 59 | Admitting: Family Medicine

## 2019-05-07 ENCOUNTER — Other Ambulatory Visit: Payer: Self-pay

## 2019-05-07 VITALS — BP 118/60 | HR 64

## 2019-05-07 DIAGNOSIS — R42 Dizziness and giddiness: Secondary | ICD-10-CM | POA: Diagnosis not present

## 2019-05-07 NOTE — Progress Notes (Signed)
   Subjective:    Patient ID: [name], female    DOB: [birth year], 53 y.o.   MRN: [id number]   CC: feeling off  HPI: Feeling off Patient today reporting that she is "feeling off.  For the past 4 days.  Reports that her fingers and legs have been tingling.  She has to shake her hands frequently to get rid of the numbness.  Does have a history of restless leg syndrome but states this feels different.  Does report feeling dizzy when she wakes up almost like she is disoriented.  Not orthostatic.  Does feel exhausted all the time despite sleeping.  Tried drinking coffee 1 day last week but this did not help.  Otherwise has almost no caffeine intake.  Has been increasing her water intake because she thought she was dehydrated.  Almost feels overwhelmed but is not anxious.  Does report heart palpitations.  Does report shortness of breath with exertion.  Denies any chest pain.  Denies any edema.  Weight has been around the same recently no weight gain.  He does report weight of 205 pounds, lost 15 pounds due to diet changes and has been trying to lose weight.  Denies any medication changes denies any supplement use other than apple cider vinegar.  Does report that her neurologist has prescribed her a "headache medicine" but has not been taking it.    Objective:  BP 118/60   Pulse 64   LMP 05/21/2017 (Within Weeks)   SpO2 97%  Vitals and nursing note reviewed  General: well nourished, in no acute distress HEENT: normocephalic,PERRL, EOMI, no scleral icterus or conjunctival pallor, no nasal discharge, moist mucous membranes, good dentition without erythema or discharge noted in posterior oropharynx, uvula midline Neck: supple, non-tender, without lymphadenopathy Cardiac: RRR, clear S1 and S2, no murmurs, rubs, or gallops Respiratory: clear to auscultation bilaterally, no increased work of breathing Abdomen: soft, nontender, nondistended, no masses or organomegaly. Bowel sounds present Extremities:  no edema or cyanosis. Warm, well perfused.  5/5 muscle strength bilaterally, normal grip strength Skin: warm and dry, no rashes noted Neuro: alert and oriented, no focal deficits, CN 2-12 intact, sensation intact, no finger to nose dysmetria, normal heel to shin    Assessment & Plan:    Dizziness Unclear etiology at this time.  Can consider thyroid disorder given feelings of heart palpitations and dizziness.  Will order TSH to rule this out.  Can consider electrolyte abnormality or liver dysfunction.  Will order CMP to rule this out.  Other differentials include overdose of triptans.  Patient denies taking this recently.  Can consider migraine as patient has a history of migraine headaches followed by neurology.  Can consider demyelinating disorder.  If no improvement in 1 week we will plan to get MRI with and without contrast to rule out cranial abnormalities.  Stroke precautions given.  Strict return precautions given.  Advised patient to follow-up in 1 week for a virtual visit so that we can evaluate if the symptoms are improving.  Advised that if she gets worsening symptoms, dizziness, or headache she should go to the emergency department.  Patient verbalized agreement with plan.  Discussed patient with Dr. Owens Shark.    Return in about 1 week (around 05/14/2019) for virtual visit.   Caroline More, DO, PGY-3

## 2019-05-07 NOTE — Patient Instructions (Signed)
It was a pleasure seeing you today.   Today we discussed you feeling off   For your symptoms: I have ordered blood work for your thyroid, kidney, and liver function.  I will call or send a letter with these results.  If you continue to have dizziness or form a headache please go to the emergency department.  These can be symptoms of a stroke.  You notice any worsening symptoms please call us immediately or go to the emergency room.  Please contact your neurologist to ensure all your medications are up-to-date with the correct dosages.  Please follow up in 1 week for a virtual visit or sooner if symptoms persist or worsen. Please call the clinic immediately if you have any concerns.   Our clinic's number is [phone]. Please call with questions or concerns.   Please go to the emergency room if worsening symptoms, dizziness, headache.  Thank you,  Caroline More, DO

## 2019-05-07 NOTE — Assessment & Plan Note (Signed)
Unclear etiology at this time.  Can consider thyroid disorder given feelings of heart palpitations and dizziness.  Will order TSH to rule this out.  Can consider electrolyte abnormality or liver dysfunction.  Will order CMP to rule this out.  Other differentials include overdose of triptans.  Patient denies taking this recently.  Can consider migraine as patient has a history of migraine headaches followed by neurology.  Can consider demyelinating disorder.  If no improvement in 1 week we will plan to get MRI with and without contrast to rule out cranial abnormalities.  Stroke precautions given.  Strict return precautions given.  Advised patient to follow-up in 1 week for a virtual visit so that we can evaluate if the symptoms are improving.  Advised that if she gets worsening symptoms, dizziness, or headache she should go to the emergency department.  Patient verbalized agreement with plan.  Discussed patient with Dr. Owens Shark.

## 2019-05-08 ENCOUNTER — Telehealth: Payer: Self-pay | Admitting: *Deleted

## 2019-05-08 LAB — COMPREHENSIVE METABOLIC PANEL
ALT: 20 IU/L (ref 0–32)
AST: 16 IU/L (ref 0–40)
Albumin/Globulin Ratio: 1.3 (ref 1.2–2.2)
Albumin: 4.4 g/dL (ref 3.8–4.9)
Alkaline Phosphatase: 55 IU/L (ref 39–117)
BUN/Creatinine Ratio: 16 (ref 9–23)
BUN: 16 mg/dL (ref 6–24)
Bilirubin Total: 0.4 mg/dL (ref 0.0–1.2)
CO2: 24 mmol/L (ref 20–29)
Calcium: 9.8 mg/dL (ref 8.7–10.2)
Chloride: 102 mmol/L (ref 96–106)
Creatinine, Ser: 0.97 mg/dL (ref 0.57–1.00)
GFR calc Af Amer: 78 mL/min/{1.73_m2} (ref >59)
GFR calc non Af Amer: 67 mL/min/{1.73_m2} (ref >59)
Globulin, Total: 3.4 g/dL (ref 1.5–4.5)
Glucose: 109 mg/dL — ABNORMAL HIGH (ref 65–99)
Potassium: 4.4 mmol/L (ref 3.5–5.2)
Sodium: 139 mmol/L (ref 134–144)
Total Protein: 7.8 g/dL (ref 6.0–8.5)

## 2019-05-08 LAB — TSH: TSH: 1.25 u[IU]/mL (ref 0.450–4.500)

## 2019-05-08 NOTE — Telephone Encounter (Signed)
-----   Message from Caroline More, DO sent at 05/08/2019  9:02 AM EDT ----- Please inform patient that results of TSH and CMP are negative.

## 2019-05-08 NOTE — Telephone Encounter (Signed)
Pt informed. [name], CMA  

## 2019-05-11 NOTE — Progress Notes (Signed)
[facility] Tele[facility] Visit I connected with  [name] on 05/12/19 by a video enabled telemedicine application and verified that I am speaking with the correct person using two identifiers.   I discussed the limitations of evaluation and management by telemedicine. The patient expressed understanding and agreed to proceed.  Patient consented to have virtual visit. Method of visit: Telephone  Encounter participants: Patient: [name] - located at home Provider: Caroline More - located at [facility] Others (if applicable): none  Chief Complaint: feeling off   HPI: Feeling off Recently seen on 7/9.  At that time laboratories obtained which were within normal limits.  Recommended to follow-up in 1 week and plan to get MRI with and without contrast cranial abnormalities if symptoms persist.  Waking up in the morning. Feeling very dizzy. At work patient feels like she walks over to one side, almost like she is walking more toward one side. Denies LOC. Has tried to even take time getting up but still feels dizzy. Sometimes has nausea associated with dizziness. Took "nausea pill" which helped a little bit.   Got short of breath walking to car, but this has happened to her in the past 2/2 COPD  ROS: per HPI  Pertinent PMHx: chronic migraines, asthma, OSA,   Exam:  Respiratory: Speaking full sentences, no respiratory distress, no increased work of breathing  Assessment/Plan:  Dizziness Etiology still remains unclear.  Now with new possible gait abnormality.  Can consider cerebellar ab normality versus hydrocephalus.  Will obtain MRI with and without contrast to rule out structural disorders.  Strict return precautions given.  Stroke precautions given.  Advised to follow-up in 1 week.  Can consider CBC at next appointment.  Discussed with Dr. Gwendlyn Deutscher.   Time spent during visit with patient: 15 minutes

## 2019-05-12 ENCOUNTER — Other Ambulatory Visit: Payer: Self-pay

## 2019-05-12 ENCOUNTER — Telehealth (INDEPENDENT_AMBULATORY_CARE_PROVIDER_SITE_OTHER): Payer: 59 | Admitting: Family Medicine

## 2019-05-12 ENCOUNTER — Telehealth: Payer: Self-pay

## 2019-05-12 DIAGNOSIS — H814 Vertigo of central origin: Secondary | ICD-10-CM

## 2019-05-12 DIAGNOSIS — R42 Dizziness and giddiness: Secondary | ICD-10-CM

## 2019-05-12 NOTE — Assessment & Plan Note (Signed)
Etiology still remains unclear.  Now with new possible gait abnormality.  Can consider cerebellar ab normality versus hydrocephalus.  Will obtain MRI with and without contrast to rule out structural disorders.  Strict return precautions given.  Stroke precautions given.  Advised to follow-up in 1 week.  Can consider CBC at next appointment.  Discussed with Dr. Gwendlyn Deutscher.

## 2019-05-12 NOTE — Telephone Encounter (Signed)
Called and informed patient of MRI on Thursday 05/21/2019 at 1400 with a show time of 1330 at [facility].  Patient verbalized understanding.  Ozella Almond, [place]

## 2019-05-13 ENCOUNTER — Telehealth: Payer: Self-pay | Admitting: Family Medicine

## 2019-05-13 ENCOUNTER — Ambulatory Visit (HOSPITAL_COMMUNITY)
Admission: RE | Admit: 2019-05-13 | Discharge: 2019-05-13 | Disposition: A | Discharge status: Home / Self Care | Payer: 59 | Source: Ambulatory Visit | Attending: Family Medicine | Admitting: Family Medicine

## 2019-05-13 ENCOUNTER — Encounter: Payer: Self-pay | Admitting: Family Medicine

## 2019-05-13 ENCOUNTER — Other Ambulatory Visit: Payer: Self-pay

## 2019-05-13 DIAGNOSIS — H814 Vertigo of central origin: Secondary | ICD-10-CM | POA: Diagnosis not present

## 2019-05-13 DIAGNOSIS — R269 Unspecified abnormalities of gait and mobility: Secondary | ICD-10-CM | POA: Diagnosis not present

## 2019-05-13 DIAGNOSIS — R51 Headache: Secondary | ICD-10-CM | POA: Diagnosis not present

## 2019-05-13 DIAGNOSIS — R42 Dizziness and giddiness: Secondary | ICD-10-CM | POA: Diagnosis not present

## 2019-05-13 MED ORDER — GADOBUTROL 1 MMOL/ML IV SOLN
10.0000 mL | Freq: Once | INTRAVENOUS | Status: AC | PRN
  Administered 2019-05-13: 10 mL via INTRAVENOUS

## 2019-05-13 NOTE — Addendum Note (Signed)
Addended by: Junious Dresser on: 05/13/2019 12:23 PM   Modules accepted: Orders

## 2019-05-13 NOTE — Telephone Encounter (Signed)
Patient needs a note for work excusing her so she can go to MRI apt. Fax number for note [phone]. Please advise. I advised her to ask [facility] as well for one.

## 2019-05-13 NOTE — Telephone Encounter (Signed)
Patient originally scheduled for MRI on 05/21/19.  After further discussion with preceptor Dr. Gwendlyn Deutscher patient will need MRI today (05/13/19). Will forward message to red team to schedule this MRI.   If unable to get appointment for MRI today then patient will need to go to ED.   Please call patient with new MRI time or advise to go to ED if unable to get MRI today.   Dalphine Handing, PGY-3 [facility] 05/13/2019 12:13 PM

## 2019-05-13 NOTE — Telephone Encounter (Signed)
Note written and I have routed to both [facility] and RED team. Please fax note to number provided.   Dalphine Handing, PGY-3 [facility] 05/13/2019 6:47 PM

## 2019-05-13 NOTE — Telephone Encounter (Signed)
[place] 05/13/19 @ 5:30. [employer] INFORMING PT. PLEASE SIGN ORDER. THANK YOU! Maleak Brazzel Kennon Holter, CMA

## 2019-05-13 NOTE — Telephone Encounter (Signed)
Patient calls nurse line returning call. Informed patient of need for MRI today and gave her new date and time information. Patient stated, "I dont get off of work until after 8pm." I informed her it appears to be urgent, since they rescheduled from 7/23. Patient will call her job and plan to make the MRI apt. Patient will call if she needs a note excusing her from work.

## 2019-05-13 NOTE — Telephone Encounter (Signed)
Order has been signed. Thank you very much for re-scheduling patient!  Dalphine Handing, PGY-3 [facility] 05/13/2019 12:31 PM

## 2019-05-14 ENCOUNTER — Telehealth: Payer: Self-pay | Admitting: Family Medicine

## 2019-05-14 ENCOUNTER — Telehealth: Payer: Self-pay

## 2019-05-14 NOTE — Telephone Encounter (Signed)
Faxed letter to [phone].  Ozella Almond, [place]

## 2019-05-14 NOTE — Telephone Encounter (Signed)
Attempted to call patient to give her results of MRI. No answer and VM not set up to leave HIPAA compliant voicemail with results.   MRI showing normal examination and no abnormality seen to explain the presenting symptoms.   Given that workup has thus far been negative but patient continues to have symptoms I will plan to refer patient to neurology. Patient already follows with neurology for migraines (last seen 04/01/2019), no referral needed as patient has established care at [facility].   Please call patient and inform her MRI was normal and that she should schedule appointment with neurology as soon as she is able to (I would prefer within the next 1-2 weeks).   If any further questions I am happy to speak with her on the phone but was unable to reach her this morning.   Dalphine Handing, PGY-3 [facility] 05/14/2019 8:34 AM

## 2019-05-14 NOTE — Telephone Encounter (Signed)
Left HIPPA compliant message for patient with results.  [name], [name]

## 2019-05-18 ENCOUNTER — Telehealth: Payer: Self-pay | Admitting: *Deleted

## 2019-05-18 DIAGNOSIS — Z0289 Encounter for other administrative examinations: Secondary | ICD-10-CM

## 2019-05-18 NOTE — Telephone Encounter (Signed)
Pt matrix form on [place] desk.

## 2019-05-19 NOTE — Telephone Encounter (Signed)
FMLA form for migraines completed. To AL/NP desk for review and signature.

## 2019-05-21 ENCOUNTER — Other Ambulatory Visit: Payer: Self-pay

## 2019-05-21 ENCOUNTER — Encounter: Payer: Self-pay | Admitting: Family Medicine

## 2019-05-21 ENCOUNTER — Ambulatory Visit (HOSPITAL_COMMUNITY): Payer: 59

## 2019-05-21 ENCOUNTER — Ambulatory Visit: Payer: 59 | Admitting: Family Medicine

## 2019-05-21 VITALS — BP 135/90 | HR 63 | Temp 98.0°F | Ht 61.0 in | Wt 202.4 lb

## 2019-05-21 DIAGNOSIS — G43701 Chronic migraine without aura, not intractable, with status migrainosus: Secondary | ICD-10-CM | POA: Diagnosis not present

## 2019-05-21 MED ORDER — TOPIRAMATE 25 MG PO TABS: 25.0000 mg | ORAL_TABLET | Freq: Two times a day (BID) | ORAL | 3 refills | Status: DC

## 2019-05-21 MED FILL — TOPIRAMATE 25 MG TAB: 25 | 30 days supply | Qty: 60 | Fill #0

## 2019-05-21 NOTE — Patient Instructions (Addendum)
Continue Ajovy, please use consistently  Start topiramate 25mg  daily for 1 week, may increase to 1 tablet twice daily if well tolerated  Please work on consistent use of CPAP  PT for dry needling   Follow up in 1 year, sooner if needed    Migraine Headache A migraine headache is a very strong throbbing pain on one side or both sides of your head. This type of headache can also cause other symptoms. It can last from 4 hours to 3 days. Talk with your doctor about what things may bring on (trigger) this condition. What are the causes? The exact cause of this condition is not known. This condition may be triggered or caused by:  Drinking alcohol.  Smoking.  Taking medicines, such as: ? Medicine used to treat chest pain (nitroglycerin). ? Birth control pills. ? Estrogen. ? Some blood pressure medicines.  Eating or drinking certain products.  Doing physical activity. Other things that may trigger a migraine headache include:  Having a menstrual period.  Pregnancy.  Hunger.  Stress.  Not getting enough sleep or getting too much sleep.  Weather changes.  Tiredness (fatigue). What increases the risk?  Being 98-29 years old.  Being female.  Having a family history of migraine headaches.  Being Caucasian.  Having depression or anxiety.  Being very overweight. What are the signs or symptoms?  A throbbing pain. This pain may: ? Happen in any area of the head, such as on one side or both sides. ? Make it hard to do daily activities. ? Get worse with physical activity. ? Get worse around bright lights or loud noises.  Other symptoms may include: ? Feeling sick to your stomach (nauseous). ? Vomiting. ? Dizziness. ? Being sensitive to bright lights, loud noises, or smells.  Before you get a migraine headache, you may get warning signs (an aura). An aura may include: ? Seeing flashing lights or having blind spots. ? Seeing bright spots, halos, or zigzag lines.  ? Having tunnel vision or blurred vision. ? Having numbness or a tingling feeling. ? Having trouble talking. ? Having weak muscles.  Some people have symptoms after a migraine headache (postdromal phase), such as: ? Tiredness. ? Trouble thinking (concentrating). How is this treated?  Taking medicines that: ? Relieve pain. ? Relieve the feeling of being sick to your stomach. ? Prevent migraine headaches.  Treatment may also include: ? Having acupuncture. ? Avoiding foods that bring on migraine headaches. ? Learning ways to control your body functions (biofeedback). ? Therapy to help you know and deal with negative thoughts (cognitive behavioral therapy). Follow these instructions at home: Medicines  Take over-the-counter and prescription medicines only as told by your doctor.  Ask your doctor if the medicine prescribed to you: ? Requires you to avoid driving or using heavy machinery. ? Can cause trouble pooping (constipation). You may need to take these steps to prevent or treat trouble pooping:  Drink enough fluid to keep your pee (urine) pale yellow.  Take over-the-counter or prescription medicines.  Eat foods that are high in fiber. These include beans, whole grains, and fresh fruits and vegetables.  Limit foods that are high in fat and sugar. These include fried or sweet foods. Lifestyle  Do not drink alcohol.  Do not use any products that contain nicotine or tobacco, such as cigarettes, e-cigarettes, and chewing tobacco. If you need help quitting, ask your doctor.  Get at least 8 hours of sleep every night.  Limit and deal  with stress. General instructions      Keep a journal to find out what may bring on your migraine headaches. For example, write down: ? What you eat and drink. ? How much sleep you get. ? Any change in what you eat or drink. ? Any change in your medicines.  If you have a migraine headache: ? Avoid things that make your symptoms worse,  such as bright lights. ? It may help to lie down in a dark, quiet room. ? Do not drive or use heavy machinery. ? Ask your doctor what activities are safe for you.  Keep all follow-up visits as told by your doctor. This is important. Contact a doctor if:  You get a migraine headache that is different or worse than others you have had.  You have more than 15 headache days in one month. Get help right away if:  Your migraine headache gets very bad.  Your migraine headache lasts longer than 72 hours.  You have a fever.  You have a stiff neck.  You have trouble seeing.  Your muscles feel weak or like you cannot control them.  You start to lose your balance a lot.  You start to have trouble walking.  You pass out (faint).  You have a seizure. Summary  A migraine headache is a very strong throbbing pain on one side or both sides of your head. These headaches can also cause other symptoms.  This condition may be treated with medicines and changes to your lifestyle.  Keep a journal to find out what may bring on your migraine headaches.  Contact a doctor if you get a migraine headache that is different or worse than others you have had.  Contact your doctor if you have more than 15 headache days in a month. This information is not intended to replace advice given to you by your health care provider. Make sure you discuss any questions you have with your health care provider. Document Released: 07/24/2008 Document Revised: 02/06/2019 Document Reviewed: 11/27/2018 Elsevier Patient Education  [employer].   Sleep Apnea Sleep apnea affects breathing during sleep. It causes breathing to stop for a short time or to become shallow. It can also increase the risk of:  Heart attack.  Stroke.  Being very overweight (obese).  Diabetes.  Heart failure.  Irregular heartbeat. The goal of treatment is to help you breathe normally again. What are the causes? There are  three kinds of sleep apnea:  Obstructive sleep apnea. This is caused by a blocked or collapsed airway.  Central sleep apnea. This happens when the brain does not send the right signals to the muscles that control breathing.  Mixed sleep apnea. This is a combination of obstructive and central sleep apnea. The most common cause of this condition is a collapsed or blocked airway. This can happen if:  Your throat muscles are too relaxed.  Your tongue and tonsils are too large.  You are overweight.  Your airway is too small. What increases the risk?  Being overweight.  Smoking.  Having a small airway.  Being older.  Being female.  Drinking alcohol.  Taking medicines to calm yourself (sedatives or tranquilizers).  Having family members with the condition. What are the signs or symptoms?  Trouble staying asleep.  Being sleepy or tired during the day.  Getting angry a lot.  Loud snoring.  Headaches in the morning.  Not being able to focus your mind (concentrate).  Forgetting things.  Less  interest in sex.  Mood swings.  Personality changes.  Feelings of sadness (depression).  Waking up a lot during the night to pee (urinate).  Dry mouth.  Sore throat. How is this diagnosed?  Your medical history.  A physical exam.  A test that is done when you are sleeping (sleep study). The test is most often done in a sleep lab but may also be done at home. How is this treated?   Sleeping on your side.  Using a medicine to get rid of mucus in your nose (decongestant).  Avoiding the use of alcohol, medicines to help you relax, or certain pain medicines (narcotics).  Losing weight, if needed.  Changing your diet.  Not smoking.  Using a machine to open your airway while you sleep, such as: ? An oral appliance. This is a mouthpiece that shifts your lower jaw forward. ? A CPAP device. This device blows air through a mask when you breathe out (exhale). ? An  EPAP device. This has valves that you put in each nostril. ? A BPAP device. This device blows air through a mask when you breathe in (inhale) and breathe out.  Having surgery if other treatments do not work. It is important to get treatment for sleep apnea. Without treatment, it can lead to:  High blood pressure.  Coronary artery disease.  In men, not being able to have an erection (impotence).  Reduced thinking ability. Follow these instructions at home: Lifestyle  Make changes that your doctor recommends.  Eat a healthy diet.  Lose weight if needed.  Avoid alcohol, medicines to help you relax, and some pain medicines.  Do not use any products that contain nicotine or tobacco, such as cigarettes, e-cigarettes, and chewing tobacco. If you need help quitting, ask your doctor. General instructions  Take over-the-counter and prescription medicines only as told by your doctor.  If you were given a machine to use while you sleep, use it only as told by your doctor.  If you are having surgery, make sure to tell your doctor you have sleep apnea. You may need to bring your device with you.  Keep all follow-up visits as told by your doctor. This is important. Contact a doctor if:  The machine that you were given to use during sleep bothers you or does not seem to be working.  You do not get better.  You get worse. Get help right away if:  Your chest hurts.  You have trouble breathing in enough air.  You have an uncomfortable feeling in your back, arms, or stomach.  You have trouble talking.  One side of your body feels weak.  A part of your face is hanging down. These symptoms may be an emergency. Do not wait to see if the symptoms will go away. Get medical help right away. Call your local emergency services (911 in the [place] Do not drive yourself to the hospital. Summary  This condition affects breathing during sleep.  The most common cause is a collapsed or  blocked airway.  The goal of treatment is to help you breathe normally while you sleep. This information is not intended to replace advice given to you by your health care provider. Make sure you discuss any questions you have with your health care provider. Document Released: 07/24/2008 Document Revised: 08/01/2018 Document Reviewed: 06/10/2018 Elsevier Patient Education  2020 [employer].

## 2019-05-21 NOTE — Progress Notes (Signed)
PATIENT: [name] DOB: January 23, 1966  REASON FOR VISIT: follow up HISTORY FROM: patient  Chief Complaint  Patient presents with   Follow-up    Room 1, Alone. states that she has been having tension head aches. States the back of her neck is sore.   Migraine     HISTORY OF PRESENT ILLNESS: Today 05/21/19 SELENE PELTZER is a 53 y.o. female here today for follow up of migraines.  Her last visit was about a month ago when she reported having some difficulty at the pharmacy with her medications.  Her dose of Ajovy was given the last week in May.  She has not had a dose of Ajovy since.  She reports trying Relpax that did offer minimal relief.  She continues to have consistent migraines.  She reports at least 4-5 migraines per week.  She was seen by her pulmonologist about a month ago who reported poor compliance with CPAP therapy.  She states that she is usually consistent in use but over the past 6 weeks or so she has not used her CPAP.  She states that frequent sinus infections and headaches prevent her from using CPAP.  She is treated with Zyrtec daily for seasonal allergies.  She has a tension type headache nearly every day.  She reports migraines have recently been behind her right eye.  She gets very fatigued, nauseated, dizzy and occasionally has some numbness in her fingertips with migraine.  She reports tension in her neck and the base of her skull with headaches.  She does occasionally have blurred vision.  She has recently had an eye exam that was reportedly normal.  She does wear glasses.  HISTORY: (copied from my note on 04/01/2019)  CHIYEKO FERRE is a 53 y.o. female here today for follow up of migraines. She continues Ajovy for prevention and Imitrex for acute management. She is also prescribed gabapentin for pain and hot flashes as well as naproxen for pain.  She reports that she was late getting this month's dose of Ajovy due to pharmacy delay.  She has noted an increased frequency of  her headaches this past week.  She has a migraine today that is been present for the past 3 days.  She has not taking any abortive therapy due to not having any.  She reports that this is her typical migraine.  She denies any new finding on.  History (copied from Dr Cathren Laine note on 03/19/2018)  Interval history 03/19/2018: The Ajovy has helped with the headaches she likes it. She has had a great response. 2 migraines in April lasted 45 minutes. Most of the month she had no headaches she had some but not severe. Feeling great.Will continue Ajovy. Discussed botox and other treatments.  Interval History 09/10/2017: At last appointment ordered MRIs of the brain and orbits, they were not completed. Also ordered physical therapy but finished only 2/13 visits. Had surgery 08/15/2017. hysterectomy, Bilateral salpingectomy, left oopherectomy. She can't afford MRI or PT. Started Aimovig, but she could not get it. Will try Ajovy, will give a copay ard.   HPI:Teosha M Gloveris a 53 y.o.femalehere as a referral from Dr. Rosezella [place] migraines.Past medical history of migraines, high blood pressure, anxiety, depression, asthma. Headaches wake her up in the morning. Migraines since the age of 48. Worse this year, she was taking care of her grandmother and passed away and there has been stress and a bad relationship. She has had migraine for days after getting a  flu shot. She was a patient of the [facility]. They start at the back of there head and spread all ove rthe head, she has tenderness of the skin, She has a prodrome feels the pain in the neck. For the last year. She has light and sound sensitivity, it is pulsating and throbbing and pounding with nausea and vomiting and also has vision changes and episodic vision loss unknown eye. Migraines can last weeks, has had one for weeks now, she was in the ED last Saturday, she is missing work. Sleeping in a dark room helps. Stress worsens it. Woke up with  a headache again this morning and she has neck pain and neck tightness. Reports daily headaches and at least 15 are migrainous for the last 9 months. She has allergies as well. No medication overuse. No aura. No other focal neurologic deficits, associated symptoms, inciting events or modifiable factors.Lots of stress.  Medications tried include: Baclofen, Imitrex, Lexapro, gabapentin, losartan, naproxen, Zofran, propranolol, tramadol.She tried amitriptyline and has an allergy to it, zomig  Reviewed notes, labs and imaging from outside physicians, which showed:  presentedthe emergency room for evaluation of intermittent, generalized headache for the past 4 days on 07/20/2017. Patient had associated dizziness and feeling off balance with nausea. The headache began in the back of the head and radiated to the front. Similar to previous migraines. She tried ibuprofen. The past she received several Botox injections with provided her with 1-2 years relief in her migraines. Denied any numbness, fevers, chills, headache injury, falls, blood thinner use. She was given Compazine, Benadryl and IV fluids in the emergency room. Symptoms reviewed and she was discharged.  REVIEW OF SYSTEMS: Out of a complete 14 system review of symptoms, the patient complains only of the following symptoms, memory loss, dizziness, headaches, numbness and all other reviewed systems are negative.  ALLERGIES: Allergies  Allergen Reactions   Amitriptyline     Abnormal behavior. Just doesn't feel like normal self    Fluticasone-Salmeterol Other (See Comments)    Thrush even with mouth rinsing; worsens cough   Imitrex [Sumatriptan]     Made her head feel like its on fire   Penicillins Itching and Swelling    Has patient had a PCN reaction causing immediate rash, facial/tongue/throat swelling, SOB or lightheadedness with hypotension: No Has patient had a PCN reaction causing severe rash involving mucus membranes or skin  necrosis: No Has patient had a PCN reaction that required hospitalization: No Has patient had a PCN reaction occurring within the last 10 years: Yes If all of the above answers are "NO", then may proceed with Cephalosporin use.    Ciprofloxacin Itching and Rash   Macrobid [Nitrofurantoin Monohyd Macro] Itching   Sulfamethoxazole-Trimethoprim Rash    HOME MEDICATIONS: Outpatient Medications Prior to Visit  Medication Sig Dispense Refill   AJOVY 225 MG/1.5ML SOSY INJECT EVERY 30 DAY INTO THE SKIN 1.5 mL 5   albuterol (PROVENTIL) (2.5 MG/3ML) 0.083% nebulizer solution Take 3 mLs (2.5 mg total) every 4 (four) hours as needed by nebulization. 75 mL 6   albuterol (VENTOLIN HFA) 108 (90 Base) MCG/ACT inhaler INHALE 2 PUFFS BY MOUTH EVERY 3-4 HOURS AS NEEDED FOR SHORTNESS OF BREATH\WHEEZING\RECURRENT COUGH 18 g 6   baclofen (LIORESAL) 10 MG tablet Take 1 tablet (10 mg total) by mouth 3 (three) times daily as needed (headaches). 60 each 1   benzonatate (TESSALON PERLES) 100 MG capsule Take 2 capsules (200 mg total) by mouth 3 (three) times daily.  90 capsule 2   BIOTIN PO Take 2,000 mg by mouth daily.     cetirizine (ZYRTEC) 10 MG tablet Take 1 tablet (10 mg total) daily by mouth. 30 tablet 11   diclofenac (VOLTAREN) 75 MG EC tablet Take 1 tablet (75 mg total) by mouth 2 (two) times daily with a meal. 60 tablet 0   EEMT HS 0.625-1.25 MG tablet TAKE 1 TABLET BY MOUTH DAILY. 90 tablet 5   escitalopram (LEXAPRO) 20 MG tablet TAKE 1 TABLET BY MOUTH ONCE DAILY (Patient taking differently: Take 20 mg by mouth at bedtime. ) 30 tablet 3   esomeprazole (NEXIUM) 40 MG capsule TAKE 1 CAPSULE BY MOUTH ONCE DAILY 30 capsule 0   fluticasone (FLONASE) 50 MCG/ACT nasal spray PLACE 2 SPRAYS INTO THE NOSE DAILY. 16 g 6   furosemide (LASIX) 20 MG tablet Take 1 tablet (20 mg total) by mouth daily. 30 tablet 0   gabapentin (NEURONTIN) 300 MG capsule TAKE 1 CAPSULE (300 MG TOTAL) BY MOUTH EVERY 8 (EIGHT)  HOURS AS NEEDED (PAIN). CAN TAKE 2 CAPSULES AT NIGHT AS NEEDED FOR HOT FLASHES 90 capsule 3   losartan (COZAAR) 50 MG tablet TAKE 1 TABLET BY MOUTH ONCE DAILY 30 tablet 0   mometasone-formoterol (DULERA) 200-5 MCG/ACT AERO INHALE 2 PUFFS BY MOUTH 2 TIMES DAILY. 13 g 10   montelukast (SINGULAIR) 10 MG tablet Take 1 tablet (10 mg total) by mouth at bedtime. 90 tablet 3   Multiple Vitamin (MULTIVITAMIN WITH MINERALS) TABS tablet Take 1 tablet by mouth daily.     naproxen (NAPROSYN) 500 MG tablet TAKE 1 TABLET (500 MG TOTAL) BY MOUTH 2 (TWO) TIMES DAILY AS NEEDED FOR MILD PAIN OR MODERATE PAIN. 60 tablet 2   ondansetron (ZOFRAN) 4 MG tablet Take 1 tablet (4 mg total) by mouth every 8 (eight) hours as needed for nausea or vomiting. 6 tablet 0   oxybutynin (DITROPAN-XL) 10 MG 24 hr tablet TAKE 1 TABLET BY MOUTH AT BEDTIME 30 tablet 0   phenazopyridine (PYRIDIUM) 200 MG tablet Take 1 tablet (200 mg total) by mouth 3 (three) times daily as needed for pain. 10 tablet 0   pravastatin (PRAVACHOL) 40 MG tablet Take 1 tablet (40 mg total) by mouth daily. If muscle pain may take every other day. 90 tablet 2   Pyridoxine HCl (VITAMIN B-6 PO) Take 1 tablet by mouth daily. Unknown OTC strength     Spacer/Aero-Holding Chambers (AEROCHAMBER MV) inhaler Use as instructed 1 each 0   Tiotropium Bromide Monohydrate (SPIRIVA RESPIMAT) 2.5 MCG/ACT AERS Inhale 2 puffs into the lungs daily. 12 g 10   vitamin C (ASCORBIC ACID) 500 MG tablet Take 500 mg by mouth daily.      acetaminophen-codeine (TYLENOL #3) 300-30 MG tablet Take 1 tablet by mouth every 6 (six) hours as needed for moderate pain. 15 tablet 0   eletriptan (RELPAX) 40 MG tablet Take 1 tablet (40 mg total) by mouth as needed for migraine or headache. May repeat in 2 hours if headache persists or recurs. 10 tablet 11   SUMAtriptan (IMITREX) 50 MG tablet Take 50 mg by mouth every 2 (two) hours as needed for migraine. May repeat in 2 hours if headache  persists or recurs.     Facility-Administered Medications Prior to Visit  Medication Dose Route Frequency Provider Last Rate Last Dose   0.9 %  sodium chloride infusion  500 mL Intravenous Once Pyrtle, Lajuan Lines, MD        PAST  MEDICAL HISTORY: Past Medical History:  Diagnosis Date   Allergy    Anemia    Anxiety    Arthritis    all over   Asthma    COPD (chronic obstructive pulmonary disease) ([place])    Depression    Dysrhythmia    Irregular heat w/ asthma episodes and anxiety   GERD (gastroesophageal reflux disease)    Hyperlipidemia    Hypertension    Migraines    PONV (postoperative nausea and vomiting)    Pre-diabetes    Sleep apnea    uses CPAP   Urinary incontinence    frequent urination- tx w/ vesicare   Urinary urgency     PAST SURGICAL HISTORY: Past Surgical History:  Procedure Laterality Date   ABDOMINAL HYSTERECTOMY Bilateral 08/15/2017   Procedure: SUPRACERVICAL HYSTERECTOMY ABDOMINAL W/ BILATERAL SALPINGECTOMY AND LEFT OOPHORECTOMY;  Surgeon: Osborne Oman, MD;  Location: [facility];  Service: Gynecology;  Laterality: Bilateral;   ABLATION     CESAREAN SECTION     x3   DILATION AND CURETTAGE OF UTERUS  05/31/2005   MAB, TAB x 2   HERNIA REPAIR  1990's   HYSTEROSCOPY W/ ENDOMETRIAL ABLATION     LEEP     MAB     x 4   MOUTH SURGERY     teeth extraction   TUBAL LIGATION  11/01/2006   WISDOM TOOTH EXTRACTION      FAMILY HISTORY: Family History  Problem Relation Age of Onset   Diabetes Maternal Grandmother    Colon cancer Maternal Grandfather    Anesthesia problems Neg Hx    Headache Neg Hx    Aneurysm Neg Hx    Esophageal cancer Neg Hx    Rectal cancer Neg Hx    Stomach cancer Neg Hx    Migraines Neg Hx     SOCIAL HISTORY: Social History   Socioeconomic History   Marital status: Single    Spouse name: Not on file   Number of children: 5   Years of education: Not on file   Highest education level: High  school graduate  Occupational History   Not on file  Social Needs   Financial resource strain: Not on file   Food insecurity    Worry: Not on file    Inability: Not on file   Transportation needs    Medical: Not on file    Non-medical: Not on file  Tobacco Use   Smoking status: Former Smoker    Packs/day: 0.25    Years: 15.00    Pack years: 3.75    Types: Cigarettes    Quit date: 02/16/2003    Years since quitting: 16.2   Smokeless tobacco: Never Used  Substance and Sexual Activity   Alcohol use: Yes    Alcohol/week: 3.0 standard drinks    Types: 3 Glasses of wine per week   Drug use: No   Sexual activity: Not Currently    Partners: Male    Birth control/protection: Surgical  Lifestyle   Physical activity    Days per week: Not on file    Minutes per session: Not on file   Stress: Not on file  Relationships   Social connections    Talks on phone: Not on file    Gets together: Not on file    Attends religious service: Not on file    Active member of club or organization: Not on file    Attends meetings of clubs or organizations: Not on file  Relationship status: Not on file   Intimate partner violence    Fear of current or ex partner: Not on file    Emotionally abused: Not on file    Physically abused: Not on file    Forced sexual activity: Not on file  Other Topics Concern   Not on file  Social History Narrative   Lives at home with 1 of her sons   Right handed   Caffeine: none         ** Merged History Encounter **          PHYSICAL EXAM  Vitals:   05/21/19 1500  BP: 135/90  Pulse: 63  Temp: 98 F (36.7 C)  Weight: 202 lb 6.4 oz (91.8 kg)  Height: 5\' 1"  (1.549 m)   Body mass index is 38.24 kg/m.  Generalized: Well developed, in no acute distress  Cardiology: normal rate and rhythm, no murmur noted Neurological examination  Mentation: Alert oriented to time, place, history taking. Follows all commands speech and language  fluent Cranial nerve II-XII: Pupils were equal round reactive to light. Extraocular movements were full, visual field were full on confrontational test. Facial sensation and strength were normal. Uvula tongue midline. Head turning and shoulder shrug  were normal and symmetric. Motor: The motor testing reveals 5 over 5 strength of all 4 extremities. Good symmetric motor tone is noted throughout.  Coordination: Cerebellar testing reveals good finger-nose-finger and heel-to-shin bilaterally.  Gait and station: Gait is normal.   DIAGNOSTIC DATA (LABS, IMAGING, TESTING) - I reviewed patient records, labs, notes, testing and imaging myself where available.  No flowsheet data found.   Lab Results  Component Value Date   WBC 5.1 11/07/2018   HGB 13.0 11/07/2018   HCT 38.7 11/07/2018   MCV 92 11/07/2018   PLT 287 11/07/2018      Component Value Date/Time   NA 139 05/07/2019 1211   K 4.4 05/07/2019 1211   CL 102 05/07/2019 1211   CO2 24 05/07/2019 1211   GLUCOSE 109 (H) 05/07/2019 1211   GLUCOSE 108 (H) 05/12/2018 1500   BUN 16 05/07/2019 1211   CREATININE 0.97 05/07/2019 1211   CREATININE 0.82 09/05/2016 1653   CALCIUM 9.8 05/07/2019 1211   PROT 7.8 05/07/2019 1211   ALBUMIN 4.4 05/07/2019 1211   AST 16 05/07/2019 1211   ALT 20 05/07/2019 1211   ALKPHOS 55 05/07/2019 1211   BILITOT 0.4 05/07/2019 1211   GFRNONAA 67 05/07/2019 1211   GFRNONAA 84 09/05/2016 1653   GFRAA 78 05/07/2019 1211   GFRAA >89 09/05/2016 1653   Lab Results  Component Value Date   CHOL 225 (H) 11/07/2018   HDL 59 11/07/2018   LDLCALC 140 (H) 11/07/2018   TRIG 132 11/07/2018   CHOLHDL 3.8 11/07/2018   Lab Results  Component Value Date   HGBA1C 5.8 03/14/2018   Lab Results  Component Value Date   VITAMINB12 386 09/05/2017   Lab Results  Component Value Date   TSH 1.250 05/07/2019    ASSESSMENT AND PLAN 53 y.o. year old female  has a past medical history of Allergy, Anemia, Anxiety, Arthritis,  Asthma, COPD (chronic obstructive pulmonary disease) ([place]), Depression, Dysrhythmia, GERD (gastroesophageal reflux disease), Hyperlipidemia, Hypertension, Migraines, PONV (postoperative nausea and vomiting), Pre-diabetes, Sleep apnea, Urinary incontinence, and Urinary urgency. here with     ICD-10-CM   1. Chronic migraine without aura with status migrainosus, not intractable  G43.701 topiramate (TOPAMAX) 25 MG tablet    Ambulatory referral  to Physical Therapy    I have had a long discussion with Mrs. Haith today regarding the need for consistency in therapy.  I am concerned about frequency and intensity of migraines.  She has not taken Ajovy consistently.  Pulmonology reports poor compliance with CPAP therapy.  She has been on multiple preventative and acute medications for migraine therapy.  She does not think that she is tried topiramate.  We will start topiramate 25 mg twice daily.  She was instructed to take once daily for 1 week to ensure that she can tolerate medication.  She may increase to twice daily dosing if well tolerated.  We will also refer her to PT for dry needling as this is helped in the past.  We have discussed risk of untreated sleep apnea.  I have encouraged her to use her CPAP nightly for greater than 4 hours each night.  I have also advised consistent use of migraine preventative medications.  She may continue Relpax as needed for abortive therapy.  FMLA paperwork signed today.  She was advised to follow-up in 1 year, sooner if needed.  She verbalizes understanding and agreement with this plan.   Orders Placed This Encounter  Procedures   Ambulatory referral to Physical Therapy    Referral Priority:   Routine    Referral Type:   Physical Medicine    Referral Reason:   Specialty Services Required    Requested Specialty:   Physical Therapy    Number of Visits Requested:   1     Meds ordered this encounter  Medications   topiramate (TOPAMAX) 25 MG tablet    Sig: Take 1  tablet (25 mg total) by mouth 2 (two) times daily.    Dispense:  120 tablet    Refill:  3    Order Specific Question:   Supervising Provider    Answer:   Melvenia Beam [id number]      I spent 15 minutes with the patient. 50% of this time was spent counseling and educating patient on plan of care and medications.    Debbora Presto, FNP-C 05/21/2019, 3:56 PM [facility] [address] [place], [place] [address] [phone]

## 2019-05-21 NOTE — Telephone Encounter (Signed)
Signed and to MR. 

## 2019-05-21 NOTE — Progress Notes (Signed)
Made any corrections needed, and agree with history, physical, neuro exam,assessment and plan as stated.     Alailah Safley, MD [facility]  

## 2019-05-21 NOTE — Telephone Encounter (Signed)
Signed and returned

## 2019-05-22 ENCOUNTER — Other Ambulatory Visit: Payer: Self-pay | Admitting: Family Medicine

## 2019-05-22 ENCOUNTER — Telehealth: Payer: Self-pay | Admitting: Internal Medicine

## 2019-05-22 MED FILL — DULERA 200 MCG/5 MCG INH: 200-5 | 30 days supply | Qty: 13 | Fill #1

## 2019-05-22 MED FILL — LOSARTAN POTASSIUM 50 MG TA: 50 | 30 days supply | Qty: 30 | Fill #0

## 2019-05-22 MED FILL — ALBUTEROL SULFATE HFA 108 (: 108 (90 BAS | 16 days supply | Qty: 18 | Fill #1

## 2019-05-22 MED FILL — GABAPENTIN 300 MG CAPSULE: 300 | 18 days supply | Qty: 90 | Fill #1

## 2019-05-22 MED FILL — OXYBUTYNIN CL ER 10 MG TAB: 10 | 30 days supply | Qty: 30 | Fill #0

## 2019-05-22 MED FILL — ESOMEPRAZOLE MAG DR 40 MG C: 40 | 30 days supply | Qty: 30 | Fill #0

## 2019-05-22 MED FILL — AJOVY 225 MG/1.5ML SOSY: 225 | 30 days supply | Qty: 2 | Fill #1

## 2019-05-22 MED FILL — SPIRIVA RESPIMAT 2.5 MCG IN: 2.5 | 30 days supply | Qty: 4 | Fill #1

## 2019-05-22 MED FILL — ESCITALOPRAM 20 MG TABLET: 20 | 30 days supply | Qty: 30 | Fill #1

## 2019-05-22 NOTE — Telephone Encounter (Signed)
Received [employer] forms for [employer].  These forms have been faxed to [employer].

## 2019-05-26 ENCOUNTER — Telehealth: Payer: Self-pay

## 2019-05-26 NOTE — Telephone Encounter (Signed)
Received disability/FMLA paperwork from [employer]. Placed in DR's folder for completion.

## 2019-05-28 NOTE — Telephone Encounter (Signed)
Diability/FMLA paperwork completed and sent back to [employer] via interoffice mail today.

## 2019-06-11 DIAGNOSIS — J45901 Unspecified asthma with (acute) exacerbation: Secondary | ICD-10-CM | POA: Diagnosis not present

## 2019-06-11 DIAGNOSIS — G4733 Obstructive sleep apnea (adult) (pediatric): Secondary | ICD-10-CM | POA: Diagnosis not present

## 2019-08-31 ENCOUNTER — Telehealth: Payer: Self-pay

## 2019-08-31 ENCOUNTER — Encounter: Payer: Self-pay | Admitting: Family Medicine

## 2019-08-31 ENCOUNTER — Other Ambulatory Visit: Payer: Self-pay

## 2019-08-31 ENCOUNTER — Other Ambulatory Visit: Payer: Self-pay | Admitting: Internal Medicine

## 2019-08-31 ENCOUNTER — Other Ambulatory Visit: Payer: Self-pay | Admitting: Family Medicine

## 2019-08-31 ENCOUNTER — Ambulatory Visit (INDEPENDENT_AMBULATORY_CARE_PROVIDER_SITE_OTHER): Payer: 59 | Admitting: Family Medicine

## 2019-08-31 VITALS — BP 112/76 | HR 68 | Wt 204.2 lb

## 2019-08-31 DIAGNOSIS — N949 Unspecified condition associated with female genital organs and menstrual cycle: Secondary | ICD-10-CM

## 2019-08-31 DIAGNOSIS — R300 Dysuria: Secondary | ICD-10-CM | POA: Diagnosis not present

## 2019-08-31 DIAGNOSIS — N930 Postcoital and contact bleeding: Secondary | ICD-10-CM | POA: Diagnosis not present

## 2019-08-31 DIAGNOSIS — N3001 Acute cystitis with hematuria: Secondary | ICD-10-CM

## 2019-08-31 DIAGNOSIS — N390 Urinary tract infection, site not specified: Secondary | ICD-10-CM | POA: Insufficient documentation

## 2019-08-31 LAB — POCT URINALYSIS DIP (MANUAL ENTRY)
Bilirubin, UA: NEGATIVE
Blood, UA: NEGATIVE
Glucose, UA: NEGATIVE mg/dL
Ketones, POC UA: NEGATIVE mg/dL
Leukocytes, UA: NEGATIVE
Nitrite, UA: NEGATIVE
Protein Ur, POC: NEGATIVE mg/dL
Spec Grav, UA: 1.025 (ref 1.010–1.025)
Urobilinogen, UA: 1 E.U./dL
pH, UA: 6 (ref 5.0–8.0)

## 2019-08-31 MED ORDER — CEPHALEXIN 500 MG PO CAPS: 500.0000 mg | ORAL_CAPSULE | Freq: Two times a day (BID) | ORAL | 0 refills | Status: AC

## 2019-08-31 MED FILL — CEPHALEXIN 500 MG CAPSULE: 500 | 5 days supply | Qty: 10 | Fill #0

## 2019-08-31 MED FILL — AJOVY 225 MG/1.5ML SOSY: 225 | 30 days supply | Qty: 2 | Fill #2

## 2019-08-31 MED FILL — ESCITALOPRAM 20 MG TABLET: 20 | 30 days supply | Qty: 30 | Fill #0

## 2019-08-31 MED FILL — GABAPENTIN 300 MG CAPSULE: 300 | 18 days supply | Qty: 90 | Fill #2

## 2019-08-31 MED FILL — TOPIRAMATE 25 MG TAB: 25 | 30 days supply | Qty: 60 | Fill #1

## 2019-08-31 MED FILL — OXYBUTYNIN CL ER 10 MG TAB: 10 | 30 days supply | Qty: 30 | Fill #0

## 2019-08-31 MED FILL — ESOMEPRAZOLE MAG DR 40 MG C: 40 | 30 days supply | Qty: 30 | Fill #0

## 2019-08-31 MED FILL — SPIRIVA RESPIMAT 2.5 MCG IN: 2.5 | 30 days supply | Qty: 4 | Fill #0

## 2019-08-31 MED FILL — ALBUTEROL SULFATE HFA 108 (: 108 (90 BAS | 12 days supply | Qty: 18 | Fill #0

## 2019-08-31 MED FILL — DULERA 200 MCG/5 MCG INH: 200-5 | 30 days supply | Qty: 13 | Fill #0

## 2019-08-31 MED FILL — LOSARTAN POTASSIUM 50 MG TA: 50 | 30 days supply | Qty: 30 | Fill #0

## 2019-08-31 NOTE — Assessment & Plan Note (Signed)
Bleeding likely 2/2 to vaginal atrophy despite vaginal mucosa looking healthy on exam. Vaginal tears/lacerations unlikely as not visualized on speculum exam. Cervical causes excluded as pt had full hysterectomy. STIs unlikely as pt and partner in monogamous relationship. Advised pt to monitor sx closely and to urgently come to the clinic if sx persist after a few weeks for further workup for gyne malignancy.

## 2019-08-31 NOTE — Progress Notes (Signed)
   Subjective:    Patient ID: [name], female    DOB: 1966/09/24, 53 y.o.   MRN: [id number]   CC: [name] is a 53 yr old who presents today for symptoms of UTI>  HPI:  Urinary frequency and urgency 5 day hx of urinary urgency and frequency. Pt purposely missed a few of her Lasix doses as initially thought sx were due to medication.Continues to take her Oxybutynin. Associated sx include lower pelvic pain bilaterally, lower back pain and generally not feeling well. Pain not relieved with ibuprofen or tylenol. Usually has UTIs once or twice a year, UA is usually negative until sent for culture where there is growth. Keflex has been effective in the past. Denies dysuria, frank hematuria or fevers.   Passing clots Had sexual intercourse with fiance yesterday and partner noticed light bleeding immediately after intercourse. Pt noticed 1 pink clot after passing urine immediately intercourse and another clot this morning and was concerned as this has not happened before. Is unsure whether this is linked to the urinary sx. Denies irregular vaginal bleeding or discharge. Has had hysterectomy a few years ago with no complications. Hx of STDs as teenager but no recent, has 1 sexual partner whom she is in a monogamous relationship with.   Smoking status reviewed   ROS: pertinent noted in the HPI    Past medical history, surgical, family, and social history reviewed and updated in the EMR as appropriate. Reviewed problem list.   Objective:  BP 112/76   Pulse 68   Wt 92.6 kg   LMP 05/21/2017 (Within Weeks)   SpO2 99%   BMI 38.58 kg/m   Vitals and nursing note reviewed  General: NAD, pleasant, able to participate in exam Cardiac: RRR, S1 S2 present. normal heart sounds, no murmurs. Respiratory: CTAB, normal effort, No wheezes, rales or rhonchi GI: abdo non distended, soft tender in lower pelvis, no guarding, bowel sounds present Extremities: no edema or cyanosis. Skin: warm and dry, no  rashes noted Neuro: alert, no obvious focal deficits Psych: Normal affect and mood  Pelvic and speculum exam chaperoned by CMA Shari No vulval lesions No obvious lacerations/active bleeding from vaginal mucosa Health vaginal mucosa with no atrophy No tenderness on vaginal exam, no obvious masses palpated Able to palpate stump post hysterectomy  UA: wnl Assessment & Plan:    UTI (urinary tract infection) Likely UTI given symptoms (despite negative) UA treated with 5 days of Keflex. Unlikely to be STI.  PCB (post coital bleeding) Bleeding likely 2/2 to vaginal atrophy despite vaginal mucosa looking healthy on exam. Vaginal tears/lacerations unlikely as not visualized on speculum exam. Cervical causes excluded as pt had full hysterectomy. STIs unlikely as pt and partner in monogamous relationship. Advised pt to monitor sx closely and to urgently come to the clinic if sx persist after a few weeks for further workup for gyne malignancy.  Lattie Haw, MD  [facility] PGY-1

## 2019-08-31 NOTE — Progress Notes (Signed)
urine

## 2019-08-31 NOTE — Telephone Encounter (Signed)
Outpatient pharmacy calling to confirm Rx for cephalexin. The directions say 2 times daily for 10 days. 5 day course but quantity is 40.  Please call Manuela Schwartz at the pharmacy and give direction. [phone]. Ottis Stain, CMA

## 2019-08-31 NOTE — Assessment & Plan Note (Signed)
Likely UTI given symptoms (despite negative) UA treated with 5 days of Keflex. Unlikely to be STI.

## 2019-08-31 NOTE — Patient Instructions (Signed)
Hi [name],  Was lovely to see you in clinic today.  From the symptoms of urgency and frequency it seems like you have a UTI developing although your urinanalysis today was normal.  We will send it off for culture but in the meantime you can take a 5-day course of Keflex 500 mg twice daily. Your vaginal exam was normal and you are not tender on examination which is very reassuring.  Please monitor your urine symptoms and if you are having persistent bleeding or discharge after sexual intercourse please come back to the clinic urgently for a checkup.  Please go to the ER if you feel you need urgent medical attention, develop chest pain, shortness of breath, fevers or drowsiness.  I look forward to seeing you again,  Best wishes, Dr. Posey Pronto

## 2019-09-01 ENCOUNTER — Telehealth: Payer: Self-pay

## 2019-09-01 ENCOUNTER — Other Ambulatory Visit: Payer: Self-pay | Admitting: Family Medicine

## 2019-09-01 NOTE — Telephone Encounter (Signed)
Verbal given to Manuela Schwartz at [facility]. Keflex #10 take one tablet BID for 5 days.

## 2019-09-01 NOTE — Telephone Encounter (Signed)
Thanks Arby Barrette!!

## 2019-09-02 LAB — URINE CULTURE

## 2019-09-03 NOTE — Telephone Encounter (Signed)
Opened in error

## 2019-09-04 DIAGNOSIS — G4733 Obstructive sleep apnea (adult) (pediatric): Secondary | ICD-10-CM | POA: Diagnosis not present

## 2019-09-04 DIAGNOSIS — J45901 Unspecified asthma with (acute) exacerbation: Secondary | ICD-10-CM | POA: Diagnosis not present

## 2019-09-16 ENCOUNTER — Telehealth: Payer: Self-pay

## 2019-09-16 NOTE — Telephone Encounter (Signed)
Received disability/ FMLA forms from [employer] for Dr. Ashby Dawes.  Forms have been faxed to [employer] with note that provider is no longer with our office.

## 2019-11-02 ENCOUNTER — Other Ambulatory Visit: Payer: Self-pay | Admitting: Internal Medicine

## 2019-11-02 DIAGNOSIS — Z1231 Encounter for screening mammogram for malignant neoplasm of breast: Secondary | ICD-10-CM

## 2019-11-04 ENCOUNTER — Other Ambulatory Visit: Payer: Self-pay

## 2019-11-04 ENCOUNTER — Telehealth (INDEPENDENT_AMBULATORY_CARE_PROVIDER_SITE_OTHER): Payer: 59 | Admitting: Family Medicine

## 2019-11-04 DIAGNOSIS — J019 Acute sinusitis, unspecified: Secondary | ICD-10-CM | POA: Diagnosis not present

## 2019-11-04 MED ORDER — BENZONATATE 100 MG PO CAPS: 100.0000 mg | ORAL_CAPSULE | Freq: Two times a day (BID) | ORAL | 0 refills | Status: DC | PRN

## 2019-11-04 MED ORDER — DOXYCYCLINE HYCLATE 100 MG PO TABS: 100.0000 mg | ORAL_TABLET | Freq: Two times a day (BID) | ORAL | 0 refills | Status: AC

## 2019-11-04 MED FILL — DOXYCYCLINE HYCLATE 100 MG: 100 | 5 days supply | Qty: 10 | Fill #0

## 2019-11-04 MED FILL — BENZONATATE 100 MG CAPS: 100 | 10 days supply | Qty: 20 | Fill #0

## 2019-11-04 NOTE — Progress Notes (Signed)
[facility] Telemedicine Visit  Patient consented to have virtual visit. Method of visit: Telephone  Encounter participants: Patient: [name] - located at home Provider: Gifford Shave - located at home   Chief Complaint: Sinus drainage   HPI: Patient reports that she received her coronavirus vaccine 2 to 3 weeks ago.  Her complaint today is that she has had increased sinus drainage for the last 2 weeks.  She has cough, congestion, her ears are burning, and her nose is running while she is sleeping.  Patient says that she has cough while sleeping at baseline because of her COPD and asthma.  Patient denies any fever, sore throat, chills, body aches, no changes in taste or smell.  For symptomatic relief patient has been taking Prilosec because she has issues with reflux and wants to make sure that is not causing some of the symptoms.  She also takes Zyrtec daily, some kind of over-the-counter sinus reliever, albuterol, Dulera, Spiriva, and nasal spray with little to no help.  She reports taking some kind of cough medication with Sudafed in it since last Friday.  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  She still wears PPE including N95 masks with Covid patients and gowns gloves as well as continuously washing her hands.  ROS: per HPI  Pertinent PMHx: Asthma, COPD  Exam:  Respiratory: Patient is in no apparent shortness of breath.  She does have sinus congestion  Assessment/Plan:  Sinusitis, acute Patient with 2-week history of signs and symptoms consistent with sinusitis.  She has persistent cough with nasal congestion.  Given this duration I am concerned that it may be bacterial in etiology.  She has allergy to penicillin.  There is also concerned that this may be Covid given she has a high risk profession. -Doxycycline twice daily for 5 days -Tessalon Perles for  cough -Recommend decrease pseudoephedrine use and starting over-the-counter antitussives containing dextromethorphan and guaifenesin -Recommend she call health at work for [employer] testing    Time spent during visit with patient: 19 minutes

## 2019-11-04 NOTE — Addendum Note (Signed)
Addended by: Concepcion Living on: 11/04/2019 03:17 PM   Modules accepted: Orders

## 2019-11-04 NOTE — Assessment & Plan Note (Signed)
Patient with 2-week history of signs and symptoms consistent with sinusitis.  She has persistent cough with nasal congestion.  Given this duration I am concerned that it may be bacterial in etiology.  She has allergy to penicillin.  There is also concerned that this may be Covid given she has a high risk profession. -Doxycycline twice daily for 5 days -Tessalon Perles for cough -Recommend decrease pseudoephedrine use and starting over-the-counter antitussives containing dextromethorphan and guaifenesin -Recommend she call health at work for [employer] testing

## 2019-11-12 ENCOUNTER — Telehealth: Payer: Self-pay

## 2019-11-12 ENCOUNTER — Other Ambulatory Visit: Payer: Self-pay

## 2019-11-12 ENCOUNTER — Ambulatory Visit: Payer: 59 | Admitting: Pulmonary Disease

## 2019-11-12 ENCOUNTER — Encounter: Payer: Self-pay | Admitting: Pulmonary Disease

## 2019-11-12 VITALS — BP 130/82 | HR 68 | Temp 97.3°F | Ht 61.0 in | Wt 204.0 lb

## 2019-11-12 DIAGNOSIS — J3089 Other allergic rhinitis: Secondary | ICD-10-CM | POA: Diagnosis not present

## 2019-11-12 DIAGNOSIS — J45909 Unspecified asthma, uncomplicated: Secondary | ICD-10-CM

## 2019-11-12 DIAGNOSIS — Z9989 Dependence on other enabling machines and devices: Secondary | ICD-10-CM | POA: Diagnosis not present

## 2019-11-12 DIAGNOSIS — K219 Gastro-esophageal reflux disease without esophagitis: Secondary | ICD-10-CM | POA: Diagnosis not present

## 2019-11-12 DIAGNOSIS — J328 Other chronic sinusitis: Secondary | ICD-10-CM | POA: Diagnosis not present

## 2019-11-12 DIAGNOSIS — G4733 Obstructive sleep apnea (adult) (pediatric): Secondary | ICD-10-CM | POA: Diagnosis not present

## 2019-11-12 MED ORDER — TRELEGY ELLIPTA 200-62.5-25 MCG/INH IN AEPB: 1.0000 | INHALATION_SPRAY | Freq: Every day | RESPIRATORY_TRACT | 11 refills | Status: DC

## 2019-11-12 MED ORDER — AZELASTINE-FLUTICASONE 137-50 MCG/ACT NA SUSP: 1.0000 | Freq: Two times a day (BID) | NASAL | 6 refills | Status: DC

## 2019-11-12 MED ORDER — AZELASTINE HCL 0.1 % NA SOLN: 1.0000 | Freq: Two times a day (BID) | NASAL | 12 refills | Status: DC

## 2019-11-12 MED FILL — AZELASTINE HCL 137 MCG SPRY: 0.1 | 50 days supply | Qty: 30 | Fill #0

## 2019-11-12 NOTE — Telephone Encounter (Signed)
Received medication change request for Azelastine- fluticasone. Covered alternatives are Flonase.  Pt is currently on Flonase without relief.  Per Dr. Patsey Berthold verbally prescribe azelastine 1 spray BID.  Rx has been sent to preferred pharmacy.  Nothing further is needed.

## 2019-11-12 NOTE — Progress Notes (Signed)
   Subjective:    Patient ID: [name], female    DOB: 1965-11-26, 54 y.o.   MRN: [id number]  HPI The patient is a 54 year old former smoker previously followed by Dr. Ashby Dawes, I am assuming care due to Dr. Mathis Fare departure.  Note however that Dr. Ashby Dawes was following her mostly for issues with sleep apnea and circadian rhythm disorder/shiftwork sleep disorder.  The patient does have a history however of asthma as well has been maintained on Dulera and Spiriva since approximately 2017.  I note on Dr. Mathis Fare notes that she had persistent issues with poor control of her asthma.  She also has severe issues with sinus symptoms and gastroesophageal reflux.  Early in November she did a home remodel and since then she has had worsening symptoms.  She has had several rounds of antibiotics with poor control of symptoms of nasal congestion and cough.  Cough has been mostly nonproductive.  As noted she does endorse significant gastroesophageal reflux symptoms despite Nexium.  Reviewing her medications she appears to be maximized on most all medications.  She presents today with significant throat clearing and sniffling.  She has had difficulties being compliant with her CPAP due to nasal congestion symptoms.  She has not had any orthopnea or paroxysmal nocturnal dyspnea.  She does have issues with regurgitant material at nighttime due to severe GERD.  I have reviewed her medical records thus far.  Review of Systems A 10 point review of systems was performed and it is as noted above otherwise negative.    Objective:   Physical Exam BP 130/82 (BP Location: Left Arm, Patient Position: Sitting, Cuff Size: Large)   Pulse 68   Temp (!) 97.3 F (36.3 C) (Temporal)   Ht 5\' 1"  (1.549 m)   Wt 204 lb (92.5 kg)   LMP 05/21/2017 (Within Weeks)   SpO2 96% Comment: ON RA  BMI 38.55 kg/m  GENERAL: Well-developed obese woman in no respiratory distress, she does almost constant throat  clearing, nasal speech.  Fully ambulatory. HEAD: Normocephalic, atraumatic.  EYES: Pupils equal, round, reactive to light.  No scleral icterus.  MOUTH: Nose/mouth/throat not examined due to masking requirements for COVID 19. NECK: Supple. No thyromegaly. No nodules. No JVD.  Trachea midline. PULMONARY: Lungs with coarse breath sounds but no other adventitious sounds, symmetric, good air entry bilaterally. CARDIOVASCULAR: S1 and S2. Regular rate and rhythm.  No rubs murmurs gallops heard. GASTROINTESTINAL: MUSCULOSKELETAL: No joint swelling, no clubbing, no edema.  NEUROLOGIC: Nonfocal, awake, alert, fluent speech. SKIN: Intact,warm,dry.  Limited exam no rashes. PSYCH: Mood and behavior appropriate.       Assessment & Plan:   Chronic asthma, appears to be mild to moderate No exacerbation Simplify medications consolidate Dulera and Spiriva and to Trelegy Ellipta 200/25, 1 inhalation daily Needs control of sinus issues Refer to Dr. Tami Ribas, ENT (patient has seen Dr. Tami Ribas previously) to reevaluate sinuses Nasal hygiene with AYR nasal saline and saline gel  Chronic sinusitis Perennial rhinitis Evaluation by ENT as above  Gastroesophageal reflux Poorly controlled Continue Nexium Resume antireflux measures Consider evaluation by GI  OSA on CPAP Reiterated compliance  Follow-up 6 to 8 weeks, call sooner should any new difficulties arise.  Renold Don, MD [facility]  *This note was dictated using voice recognition software/Dragon.  Despite best efforts to proofread, errors can occur which can change the meaning.  Any change was purely unintentional.

## 2019-11-12 NOTE — Patient Instructions (Addendum)
1.  We will combine Dulera and Spiriva into 1 inhaler is going to be Trelegy Ellipta 200/25, 1 inhalation daily, make sure you rinse your mouth well after use.  You may use a little baking soda in the rinsing water to help clear the mouth well.  2.  We will refer you to Dr. Tami Ribas (ENT) to reevaluate your sinus issues.  3.  We will see you back 6 to 8 weeks.  4.  For moisturizing the nasal passages I recommend AYR saline nasal gel which can be found over-the-counter.  There are other similar products.  Marland Kitchen

## 2019-11-13 ENCOUNTER — Other Ambulatory Visit: Payer: Self-pay | Admitting: Pulmonary Disease

## 2019-11-13 ENCOUNTER — Encounter: Payer: Self-pay | Admitting: Family Medicine

## 2019-11-13 ENCOUNTER — Ambulatory Visit (INDEPENDENT_AMBULATORY_CARE_PROVIDER_SITE_OTHER): Payer: 59 | Admitting: Family Medicine

## 2019-11-13 DIAGNOSIS — L905 Scar conditions and fibrosis of skin: Secondary | ICD-10-CM | POA: Diagnosis not present

## 2019-11-13 DIAGNOSIS — J4551 Severe persistent asthma with (acute) exacerbation: Secondary | ICD-10-CM

## 2019-11-13 MED FILL — ALBUTEROL SULFATE HFA 108 (: 108 (90 BAS | 12 days supply | Qty: 7 | Fill #0

## 2019-11-13 MED FILL — TRELEGY ELLIPTA 200-62.5-25: 200-62.5-25 | 30 days supply | Qty: 60 | Fill #0

## 2019-11-13 NOTE — Assessment & Plan Note (Signed)
Area appears to be a healing scar.  It does look like there is some hair growth forming.  Without any irritation to the area.  Does not appear to be infectious.  Advised to continue conditioning area to keep this moist.  Strict return precautions given.  Also showed expected to Dr. Erin Hearing while patient was in office.  Follow-up as needed.

## 2019-11-13 NOTE — Patient Instructions (Signed)
It was a pleasure seeing you today.   Today we discussed your scalp  For your scalp: I do not think you need a dermatology referral at this time.  I was able to show the supervising doctor that specializes in dermatology here the picture.  We both agree that this is likely a scar from when you hit your head on the cabinet.  Especially since that area was bleeding at that time.  Keep a close eye on it and as long as it continues to heal it is should be okay.  If the area starts spreading, worsening, burning, painful please follow-up.  Please follow up if no improvement/worsening or sooner if symptoms persist or worsen. Please call the clinic immediately if you have any concerns.   Our clinic's number is [phone]. Please call with questions or concerns.   Thank you,  Caroline More, DO

## 2019-11-13 NOTE — Progress Notes (Signed)
   Subjective:    Patient ID: [name], female    DOB: [birth year], 54 y.o.   MRN: [id number]   CC: FMLA forms & scalp concerns  HPI: FMLA Patient presented today requesting FMLA paperwork.  States that she previously had this filled by her pulmonologist, Dr. Chase Caller, but he recently left the practice.  Her new pulmonologist states that she will not fill out FMLA paperwork and the PCP needs to do so.  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  She has also been out a lot due to Covid as health at work has been keeping her home anytime she has any symptoms.  States that the paperwork is sent to [place] however we have not received this yet.  She will contact lawyer to have this resent.  Scalp concerns Patient presenting today for scalp concerns.  States that she recently saw her hairdresser.  Who told her of this area.  States that a few weeks ago she was recently using Bobby pins and up to and also hit her head and there was some bleeding.  States that 3 weeks ago her hairdresser noticed the area.  Area appears to be improving and looks like they are starting to pop out of it.  Denies any itching, burning, pain.  Uses heat once a week at the hairdresser.  Patient took a picture last Wednesday which she showed to me.  Objective:  BP 122/78   Pulse 86   Wt 205 lb (93 kg)   LMP 05/21/2017 (Within Weeks)   SpO2 96%   BMI 38.73 kg/m  Vitals and nursing note reviewed  General: well nourished, in no acute distress HEENT: normocephalic  Neck: supple  Cardiac: Regular rate Respiratory: no increased work of breathing Extremities: no edema or cyanosis. Warm, well perfused. Skin: warm and dry, no rashes. Below image is taken from a photo in patient's phone. Patient refused exam as she just had her hair done and did not want to undo it noted Neuro: alert and oriented, no focal deficits   Assessment & Plan:    Scar of scalp  Area appears to be a healing scar.  It does look like there is some hair growth forming.  Without any irritation to the area.  Does not appear to be infectious.  Advised to continue conditioning area to keep this moist.  Strict return precautions given.  Also showed expected to Dr. Erin Hearing while patient was in office.  Follow-up as needed.    Return if symptoms worsen or fail to improve.   Caroline More, DO, PGY-3

## 2019-11-17 ENCOUNTER — Other Ambulatory Visit: Payer: Self-pay

## 2019-11-17 DIAGNOSIS — J4551 Severe persistent asthma with (acute) exacerbation: Secondary | ICD-10-CM

## 2019-11-17 MED ORDER — ALBUTEROL SULFATE (2.5 MG/3ML) 0.083% IN NEBU: 2.5000 mg | INHALATION_SOLUTION | RESPIRATORY_TRACT | 6 refills | Status: DC | PRN

## 2019-11-17 NOTE — Telephone Encounter (Signed)
Pt is requesting refill on albuterol neb solution. This medication was not prescribed by our office previously.   Dr. Patsey Berthold please advise. Thank you.

## 2019-11-18 ENCOUNTER — Other Ambulatory Visit: Payer: Self-pay | Admitting: Family Medicine

## 2019-11-18 DIAGNOSIS — R102 Pelvic and perineal pain: Secondary | ICD-10-CM

## 2019-11-18 MED FILL — ALBUTEROL 0.083% INHAL SOLN: (2.5 MG/3ML | 5 days supply | Qty: 75 | Fill #0

## 2019-11-18 MED FILL — ESCITALOPRAM 20 MG TABLET: 20 | 30 days supply | Qty: 30 | Fill #1

## 2019-11-18 MED FILL — TOPIRAMATE 25 MG TAB: 25 | 30 days supply | Qty: 60 | Fill #2

## 2019-11-18 MED FILL — AJOVY 225 MG/1.5ML SOSY: 225 | 30 days supply | Qty: 2 | Fill #3

## 2019-11-19 ENCOUNTER — Other Ambulatory Visit: Payer: Self-pay | Admitting: Family Medicine

## 2019-11-19 MED FILL — GABAPENTIN 300 MG CAPSULE: 300 | 18 days supply | Qty: 90 | Fill #0

## 2019-11-19 MED FILL — LOSARTAN POTASSIUM 50 MG TA: 50 | 30 days supply | Qty: 30 | Fill #0

## 2019-11-19 MED FILL — ESOMEPRAZOLE MAG DR 40 MG C: 40 | 30 days supply | Qty: 30 | Fill #0

## 2019-11-20 MED ORDER — ALBUTEROL SULFATE (2.5 MG/3ML) 0.083% IN NEBU: 2.5000 mg | INHALATION_SOLUTION | RESPIRATORY_TRACT | 6 refills | Status: DC | PRN

## 2019-11-20 MED FILL — OXYBUTYNIN CL ER 10 MG TAB: 10 | 30 days supply | Qty: 30 | Fill #0

## 2019-11-20 NOTE — Telephone Encounter (Signed)
Okay to fill? 

## 2019-11-27 ENCOUNTER — Telehealth: Payer: Self-pay | Admitting: Family Medicine

## 2019-11-27 DIAGNOSIS — G4733 Obstructive sleep apnea (adult) (pediatric): Secondary | ICD-10-CM | POA: Diagnosis not present

## 2019-11-27 DIAGNOSIS — J45901 Unspecified asthma with (acute) exacerbation: Secondary | ICD-10-CM | POA: Diagnosis not present

## 2019-11-27 NOTE — Telephone Encounter (Signed)
FMLA form dropped off for at front desk for completion.  Verified that patient section of form has been completed.  Last DOS/WCC with PCP was11/02/20.  Placed form in team folder to be completed by clinical staff.  Grayce Corie Chiquito

## 2019-11-27 NOTE — Telephone Encounter (Signed)
Reviewed FMLA paperwork and placed in PCP's box for completion.  .Jeslyn Amsler R Jakori Burkett, CMA' 

## 2019-12-01 NOTE — Telephone Encounter (Signed)
Per instructions faxed forms to [employer] [phone]) and placed copy in batch scanning. Left voice message for patient that forms were ready to be picked up. Placed original forms in file cabinet at front desk for patient to pick up.   Talbot Grumbling, RN

## 2019-12-01 NOTE — Telephone Encounter (Signed)
Form completed and placed in [place], DO, PGY-3 [facility] 12/01/2019 8:52 AM

## 2019-12-10 ENCOUNTER — Ambulatory Visit: Payer: 59

## 2019-12-11 ENCOUNTER — Encounter: Payer: Self-pay | Admitting: Pulmonary Disease

## 2020-01-07 DIAGNOSIS — G4726 Circadian rhythm sleep disorder, shift work type: Secondary | ICD-10-CM | POA: Diagnosis not present

## 2020-01-07 DIAGNOSIS — J410 Simple chronic bronchitis: Secondary | ICD-10-CM | POA: Diagnosis not present

## 2020-01-07 DIAGNOSIS — K219 Gastro-esophageal reflux disease without esophagitis: Secondary | ICD-10-CM | POA: Diagnosis not present

## 2020-01-07 DIAGNOSIS — G4733 Obstructive sleep apnea (adult) (pediatric): Secondary | ICD-10-CM | POA: Diagnosis not present

## 2020-01-07 DIAGNOSIS — J45909 Unspecified asthma, uncomplicated: Secondary | ICD-10-CM | POA: Diagnosis not present

## 2020-01-15 ENCOUNTER — Other Ambulatory Visit: Payer: Self-pay

## 2020-01-15 ENCOUNTER — Ambulatory Visit
Admission: RE | Admit: 2020-01-15 | Discharge: 2020-01-15 | Disposition: A | Discharge status: Home / Self Care | Payer: 59 | Source: Ambulatory Visit | Attending: Internal Medicine | Admitting: Internal Medicine

## 2020-01-15 DIAGNOSIS — Z1231 Encounter for screening mammogram for malignant neoplasm of breast: Secondary | ICD-10-CM | POA: Diagnosis not present

## 2020-01-20 ENCOUNTER — Other Ambulatory Visit: Payer: Self-pay

## 2020-01-20 ENCOUNTER — Ambulatory Visit: Payer: 59 | Admitting: Pulmonary Disease

## 2020-01-20 ENCOUNTER — Encounter: Payer: Self-pay | Admitting: Pulmonary Disease

## 2020-01-20 VITALS — BP 120/78 | HR 70 | Temp 97.7°F | Ht 61.0 in | Wt 203.4 lb

## 2020-01-20 DIAGNOSIS — J3089 Other allergic rhinitis: Secondary | ICD-10-CM | POA: Diagnosis not present

## 2020-01-20 DIAGNOSIS — J45909 Unspecified asthma, uncomplicated: Secondary | ICD-10-CM

## 2020-01-20 DIAGNOSIS — K219 Gastro-esophageal reflux disease without esophagitis: Secondary | ICD-10-CM

## 2020-01-20 DIAGNOSIS — Z9989 Dependence on other enabling machines and devices: Secondary | ICD-10-CM

## 2020-01-20 DIAGNOSIS — G4733 Obstructive sleep apnea (adult) (pediatric): Secondary | ICD-10-CM | POA: Diagnosis not present

## 2020-01-20 DIAGNOSIS — J328 Other chronic sinusitis: Secondary | ICD-10-CM

## 2020-01-20 NOTE — Progress Notes (Signed)
Subjective:    Patient ID: [name], female    DOB: [birth year], 54 y.o.   MRN: [id number]  HPI The patient is a 54 year old former smoker previously followed by Dr. Ashby Dawes, I assumed care due to Dr. Mathis Fare departure from [facility] practice.    At her initial visit here in January, we switched her to [place] 200/62.5/25.    Since being switched to Trelegy she notes that this has helped control her symptoms better.  She does have significant issues with recurrent sinus infections and follows with Dr. Judi Saa for this issue.  She continues to have significant nasal symptoms.  She is also on Singulair for these issues.  Her cough overall, is improved. She has not had any orthopnea or paroxysmal nocturnal dyspnea.  She does have issues with regurgitant material at nighttime due to severe GERD.  She has not been evaluated by GI for this issue.  She has not had any fevers, chills or sweats no sputum production or hemoptysis.  No other symptoms endorsed.   Review of Systems A 10 point review of systems was performed and it is as noted above otherwise negative.    Objective:   Physical Exam BP 120/78 (BP Location: Left Arm, Cuff Size: Normal)   Pulse 70   Temp 97.7 F (36.5 C) (Temporal)   Ht 5\' 1"  (1.549 m)   Wt 203 lb 6.4 oz (92.3 kg)   LMP 05/21/2017 (Within Weeks)   SpO2 98%   BMI 38.43 kg/m   GENERAL: Well-developed obese woman in no respiratory distress.  Fully ambulatory.  No throat clearing noted today. HEAD: Normocephalic, atraumatic.  EYES: Pupils equal, round, reactive to light.  No scleral icterus.  MOUTH: Nose/mouth/throat not examined due to masking requirements for COVID 19. NECK: Supple. No thyromegaly. No nodules. No JVD.  Trachea midline. PULMONARY: Lungs with coarse breath sounds but no other adventitious sounds, symmetric, good air entry bilaterally. CARDIOVASCULAR: S1 and S2. Regular rate and rhythm.  No rubs murmurs gallops  heard. GASTROINTESTINAL: MUSCULOSKELETAL: No joint swelling, no clubbing, no edema.  NEUROLOGIC: Nonfocal, awake, alert, fluent speech. SKIN: Intact,warm,dry.  Limited exam no rashes. PSYCH: Mood and behavior appropriate.      Assessment & Plan:     ICD-10-CM   1. Other chronic sinusitis  J32.8    Encourage patient to call ENT for reevaluation  2. Asthma, chronic, unspecified asthma severity, uncomplicated  [id number]    Continue Trelegy Reassess with PFTs once able from the COVID-19 hiatus  3. Perennial allergic rhinitis  J30.89    As above follow-up with ENT  4. OSA on CPAP  G47.33    Z99.89    Patient is following with Dr. Ashby Dawes for this issue Dr. Ashby Dawes no longer at this practice  5. Gastroesophageal reflux disease, unspecified whether esophagitis present  K21.9    Consider GI evaluation   Discussion:  Patient's issues are mostly related to her chronic sinusitis and perennial rhinitis issues.  She has been encouraged to call ENT to make an appointment and follow-up with Dr. Tami Ribas.  She also needs to continue antireflux measures and PPI.  She may need formal evaluation by GI as she continues to have significant symptoms despite these measures.  In 2 months time she is to contact us prior to that time should any new difficulties arise.  Renold Don, MD [facility]   *This note was dictated using voice recognition software/Dragon.  Despite best efforts to proofread, errors can  occur which can change the meaning.  Any change was purely unintentional.

## 2020-01-20 NOTE — Patient Instructions (Signed)
We will have you call Dr. Ileene Hutchinson office to make appointment at your convenience.  We will see you in follow-up in 2 months time  Continue using Trelegy, Singulair and as needed albuterol.

## 2020-01-27 ENCOUNTER — Other Ambulatory Visit: Payer: Self-pay | Admitting: Neurology

## 2020-01-27 ENCOUNTER — Other Ambulatory Visit: Payer: Self-pay | Admitting: Family Medicine

## 2020-01-27 MED FILL — AJOVY 225 MG/1.5ML SOSY: 225 | 30 days supply | Qty: 2 | Fill #0

## 2020-01-27 MED FILL — LOSARTAN POTASSIUM 50 MG TA: 50 | 30 days supply | Qty: 30 | Fill #0

## 2020-01-27 MED FILL — ESOMEPRAZOLE MAG DR 40 MG C: 40 | 30 days supply | Qty: 30 | Fill #0

## 2020-01-27 MED FILL — GABAPENTIN 300 MG CAPSULE: 300 | 18 days supply | Qty: 90 | Fill #1

## 2020-01-27 MED FILL — AZELASTINE HCL 137 MCG SPRY: 0.1 | 50 days supply | Qty: 30 | Fill #1

## 2020-01-27 MED FILL — ALBUTEROL 0.083% INHAL SOLN: (2.5 MG/3ML | 4 days supply | Qty: 75 | Fill #0

## 2020-01-27 MED FILL — TOPIRAMATE 25 MG TAB: 25 | 30 days supply | Qty: 60 | Fill #3

## 2020-01-27 MED FILL — TRELEGY ELLIPTA 200-62.5-25: 200-62.5-25 | 30 days supply | Qty: 60 | Fill #1

## 2020-01-27 MED FILL — ESCITALOPRAM 20 MG TABLET: 20 | 30 days supply | Qty: 30 | Fill #2

## 2020-01-27 MED FILL — OXYBUTYNIN CL ER 10 MG TAB: 10 | 30 days supply | Qty: 30 | Fill #0

## 2020-01-27 MED FILL — ALBUTEROL SULFATE HFA 108 (: 108 (90 BAS | 12 days supply | Qty: 7 | Fill #1

## 2020-02-05 DIAGNOSIS — Z03818 Encounter for observation for suspected exposure to other biological agents ruled out: Secondary | ICD-10-CM | POA: Diagnosis not present

## 2020-02-05 DIAGNOSIS — Z20822 Contact with and (suspected) exposure to covid-19: Secondary | ICD-10-CM | POA: Diagnosis not present

## 2020-02-11 DIAGNOSIS — H6063 Unspecified chronic otitis externa, bilateral: Secondary | ICD-10-CM | POA: Diagnosis not present

## 2020-02-11 DIAGNOSIS — J340 Abscess, furuncle and carbuncle of nose: Secondary | ICD-10-CM | POA: Diagnosis not present

## 2020-02-11 DIAGNOSIS — J301 Allergic rhinitis due to pollen: Secondary | ICD-10-CM | POA: Diagnosis not present

## 2020-02-11 DIAGNOSIS — G501 Atypical facial pain: Secondary | ICD-10-CM | POA: Diagnosis not present

## 2020-02-11 MED FILL — GENTAMICIN 0.1% OINTMENT: 0.1 | 30 days supply | Qty: 30 | Fill #0

## 2020-02-25 DIAGNOSIS — J45901 Unspecified asthma with (acute) exacerbation: Secondary | ICD-10-CM | POA: Diagnosis not present

## 2020-02-25 DIAGNOSIS — G4733 Obstructive sleep apnea (adult) (pediatric): Secondary | ICD-10-CM | POA: Diagnosis not present

## 2020-03-09 ENCOUNTER — Ambulatory Visit (INDEPENDENT_AMBULATORY_CARE_PROVIDER_SITE_OTHER): Payer: 59 | Admitting: Family Medicine

## 2020-03-09 ENCOUNTER — Other Ambulatory Visit (HOSPITAL_COMMUNITY)
Admission: RE | Admit: 2020-03-09 | Discharge: 2020-03-09 | Disposition: A | Discharge status: Home / Self Care | Payer: 59 | Source: Ambulatory Visit | Attending: Family Medicine | Admitting: Family Medicine

## 2020-03-09 ENCOUNTER — Other Ambulatory Visit: Payer: Self-pay

## 2020-03-09 VITALS — BP 110/72 | HR 64 | Wt 201.2 lb

## 2020-03-09 DIAGNOSIS — R350 Frequency of micturition: Secondary | ICD-10-CM

## 2020-03-09 DIAGNOSIS — G43709 Chronic migraine without aura, not intractable, without status migrainosus: Secondary | ICD-10-CM | POA: Diagnosis not present

## 2020-03-09 DIAGNOSIS — Z114 Encounter for screening for human immunodeficiency virus [HIV]: Secondary | ICD-10-CM | POA: Diagnosis not present

## 2020-03-09 DIAGNOSIS — R109 Unspecified abdominal pain: Secondary | ICD-10-CM

## 2020-03-09 DIAGNOSIS — A084 Viral intestinal infection, unspecified: Secondary | ICD-10-CM | POA: Diagnosis not present

## 2020-03-09 DIAGNOSIS — R300 Dysuria: Secondary | ICD-10-CM | POA: Insufficient documentation

## 2020-03-09 DIAGNOSIS — Z113 Encounter for screening for infections with a predominantly sexual mode of transmission: Secondary | ICD-10-CM | POA: Diagnosis not present

## 2020-03-09 LAB — POCT WET PREP (WET MOUNT)
Clue Cells Wet Prep Whiff POC: NEGATIVE
Trichomonas Wet Prep HPF POC: ABSENT

## 2020-03-09 LAB — POCT URINALYSIS DIP (MANUAL ENTRY)
Bilirubin, UA: NEGATIVE
Blood, UA: NEGATIVE
Glucose, UA: NEGATIVE mg/dL
Ketones, POC UA: NEGATIVE mg/dL
Leukocytes, UA: NEGATIVE
Nitrite, UA: NEGATIVE
Protein Ur, POC: NEGATIVE mg/dL
Spec Grav, UA: 1.02 (ref 1.010–1.025)
Urobilinogen, UA: 0.2 E.U./dL
pH, UA: 6 (ref 5.0–8.0)

## 2020-03-09 MED ORDER — ONDANSETRON HCL 4 MG PO TABS: 4.0000 mg | ORAL_TABLET | Freq: Three times a day (TID) | ORAL | 0 refills | Status: DC | PRN

## 2020-03-09 NOTE — Progress Notes (Addendum)
    SUBJECTIVE:   CHIEF COMPLAINT / HPI:   Urinary frequency  Pain urinating started 2 days ago. Pain is: RLQ radiates to back Medications tried: none Any antibiotics in the last 30 days: N More than 3 UTIs in the last 12 months: N STD exposure: unknown, patient is unsure about her sexual parnter and request STI testing.  Possibly pregnant: No, had hysterectomy   Symptoms Urgency: Y Frequency: Y Blood in urine: N Pain in back:Y Fever: N Vaginal discharge: N Mouth Ulcers: N Diarrhea: Y Nausea: Y Vomiting: N  Review of Symptoms - see HPI PMH - Smoking status noted.      PERTINENT  PMH / PSH: hysterectomy in 2018  OBJECTIVE:   BP 110/72   Pulse 64   Wt 201 lb 4 oz (91.3 kg)   LMP 05/21/2017 (Within Weeks)   SpO2 97%   BMI 38.03 kg/m    Gen: NAD, resting comfortably CV: RRR with no murmurs appreciated Pulm: NWOB, CTAB with no crackles, wheezes, or rhonchi GI: Soft, RLQ tenderness, Nondistended. MSK: no edema, cyanosis, or clubbing noted GU: No CMT, no adnexal masses palpated, no discharge Skin: warm, dry Neuro: grossly normal, moves all extremities Psych: Normal affect and thought content  Chaparoned by Salli Quarry  ASSESSMENT/PLAN:   Abdominal pain Possibly viral gastroenteritis.  Also cannot rule out right-sided diverticulitis, appendicitis.  Although this seems less likely or mild cases given the mild exam without peritonitis findings.  UA was negative to rule out cystitis/pyelonephritis, however will send urine culture to be thorough.  STI work-up sent per patient request including gonorrhea chlamydia, wet prep negative, HIV and RPR     Bonnita Hollow, MD [facility]

## 2020-03-09 NOTE — Assessment & Plan Note (Signed)
Possibly viral gastroenteritis.  Also cannot rule out right-sided diverticulitis, appendicitis.  Although this seems less likely or mild cases given the mild exam without peritonitis findings.  UA was negative to rule out cystitis/pyelonephritis, however will send urine culture to be thorough.  STI work-up sent per patient request including gonorrhea chlamydia, wet prep negative, HIV and RPR

## 2020-03-10 LAB — CERVICOVAGINAL ANCILLARY ONLY
Chlamydia: NEGATIVE
Comment: NEGATIVE
Comment: NORMAL
Neisseria Gonorrhea: NEGATIVE

## 2020-03-10 LAB — HIV ANTIBODY (ROUTINE TESTING W REFLEX): HIV Screen 4th Generation wRfx: NONREACTIVE

## 2020-03-10 LAB — RPR: RPR Ser Ql: NONREACTIVE

## 2020-03-11 ENCOUNTER — Encounter: Payer: Self-pay | Admitting: *Deleted

## 2020-03-11 LAB — URINE CULTURE

## 2020-03-16 DIAGNOSIS — H5213 Myopia, bilateral: Secondary | ICD-10-CM | POA: Diagnosis not present

## 2020-03-23 ENCOUNTER — Emergency Department (HOSPITAL_COMMUNITY): Payer: 59

## 2020-03-23 ENCOUNTER — Emergency Department (HOSPITAL_COMMUNITY)
Admission: EM | Admit: 2020-03-23 | Discharge: 2020-03-23 | Disposition: A | Discharge status: Home / Self Care | Payer: 59 | Attending: Emergency Medicine | Admitting: Emergency Medicine

## 2020-03-23 ENCOUNTER — Encounter (HOSPITAL_COMMUNITY): Payer: Self-pay | Admitting: *Deleted

## 2020-03-23 ENCOUNTER — Other Ambulatory Visit: Payer: Self-pay

## 2020-03-23 DIAGNOSIS — Y999 Unspecified external cause status: Secondary | ICD-10-CM | POA: Diagnosis not present

## 2020-03-23 DIAGNOSIS — S7012XA Contusion of left thigh, initial encounter: Secondary | ICD-10-CM | POA: Insufficient documentation

## 2020-03-23 DIAGNOSIS — M25522 Pain in left elbow: Secondary | ICD-10-CM | POA: Insufficient documentation

## 2020-03-23 DIAGNOSIS — S6992XA Unspecified injury of left wrist, hand and finger(s), initial encounter: Secondary | ICD-10-CM | POA: Diagnosis not present

## 2020-03-23 DIAGNOSIS — T07XXXA Unspecified multiple injuries, initial encounter: Secondary | ICD-10-CM | POA: Diagnosis present

## 2020-03-23 DIAGNOSIS — M25512 Pain in left shoulder: Secondary | ICD-10-CM | POA: Diagnosis not present

## 2020-03-23 DIAGNOSIS — J449 Chronic obstructive pulmonary disease, unspecified: Secondary | ICD-10-CM | POA: Diagnosis not present

## 2020-03-23 DIAGNOSIS — M79602 Pain in left arm: Secondary | ICD-10-CM | POA: Diagnosis not present

## 2020-03-23 DIAGNOSIS — I10 Essential (primary) hypertension: Secondary | ICD-10-CM | POA: Diagnosis not present

## 2020-03-23 DIAGNOSIS — Y939 Activity, unspecified: Secondary | ICD-10-CM | POA: Insufficient documentation

## 2020-03-23 DIAGNOSIS — Z79899 Other long term (current) drug therapy: Secondary | ICD-10-CM | POA: Insufficient documentation

## 2020-03-23 DIAGNOSIS — S59902A Unspecified injury of left elbow, initial encounter: Secondary | ICD-10-CM | POA: Diagnosis not present

## 2020-03-23 DIAGNOSIS — R519 Headache, unspecified: Secondary | ICD-10-CM | POA: Diagnosis not present

## 2020-03-23 DIAGNOSIS — Z87891 Personal history of nicotine dependence: Secondary | ICD-10-CM | POA: Insufficient documentation

## 2020-03-23 DIAGNOSIS — Y929 Unspecified place or not applicable: Secondary | ICD-10-CM | POA: Insufficient documentation

## 2020-03-23 DIAGNOSIS — S4992XA Unspecified injury of left shoulder and upper arm, initial encounter: Secondary | ICD-10-CM | POA: Diagnosis not present

## 2020-03-23 MED ORDER — HYDROCODONE-ACETAMINOPHEN 5-325 MG PO TABS
1.0000 | ORAL_TABLET | Freq: Once | ORAL | Status: AC
  Administered 2020-03-23: 1 via ORAL
  Filled 2020-03-23: qty 1

## 2020-03-23 MED ORDER — CYCLOBENZAPRINE HCL 5 MG PO TABS: 5.0000 mg | ORAL_TABLET | Freq: Every day | ORAL | 0 refills | Status: DC

## 2020-03-23 NOTE — ED Notes (Signed)
Patient transported to X-ray 
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
## 2020-03-23 NOTE — Discharge Instructions (Signed)
Please read instructions below. Apply ice to your areas of pain for 20 minutes at a time. You can take naproxen every 12 hours as needed for pain. You can take flexeril every 12 hours as needed for muscle spasm. Be aware this medication can make you drowsy. Schedule an appointment with your primary care provider to follow up on your visit today. Return to the ER for severely worsening headache, vision changes, if new numbness or tingling in your arms or legs, inability to urinate, inability to hold your bowels, or weakness in your extremities.
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
## 2020-03-23 NOTE — ED Notes (Signed)
ED Provider at bedside. 

## 2020-03-24 ENCOUNTER — Ambulatory Visit (INDEPENDENT_AMBULATORY_CARE_PROVIDER_SITE_OTHER): Payer: 59 | Admitting: Family Medicine

## 2020-03-24 ENCOUNTER — Other Ambulatory Visit: Payer: Self-pay

## 2020-03-24 ENCOUNTER — Encounter: Payer: Self-pay | Admitting: Family Medicine

## 2020-03-24 VITALS — BP 128/80 | HR 78 | Wt 198.8 lb

## 2020-03-24 DIAGNOSIS — M79602 Pain in left arm: Secondary | ICD-10-CM

## 2020-03-24 MED ORDER — LOSARTAN POTASSIUM 50 MG PO TABS: 50.0000 mg | ORAL_TABLET | Freq: Every day | ORAL | 0 refills | Status: DC

## 2020-03-24 MED ORDER — SHOULDER BRACE LARGE MISC: 1.0000 | 0 refills | Status: DC | PRN

## 2020-03-24 MED ORDER — NAPROXEN 500 MG PO TABS: ORAL_TABLET | ORAL | 0 refills | Status: DC

## 2020-03-24 MED FILL — CYCLOBENZAPRINE HCL 5 MG TA: 5 | 10 days supply | Qty: 10 | Fill #0

## 2020-03-24 MED FILL — AZELASTINE 0.1% (137 MCG) S: 0.1 | 50 days supply | Qty: 30 | Fill #2

## 2020-03-24 MED FILL — NAPROXEN 500 MG TABLET: 500 | 30 days supply | Qty: 60 | Fill #0

## 2020-03-24 MED FILL — LOSARTAN POTASSIUM 50 MG TA: 50 | 90 days supply | Qty: 90 | Fill #0

## 2020-03-24 MED FILL — ALBUTEROL SULFATE HFA 108 (: 108 (90 BAS | 12 days supply | Qty: 7 | Fill #2

## 2020-03-24 MED FILL — GABAPENTIN 300 MG CAPSULE: 300 | 18 days supply | Qty: 90 | Fill #2

## 2020-03-24 MED FILL — ALBUTEROL 0.083% INHAL SOLN: (2.5 MG/3ML | 4 days supply | Qty: 75 | Fill #1

## 2020-03-24 MED FILL — AJOVY 225 MG/1.5ML SOSY: 225 | 30 days supply | Qty: 2 | Fill #1

## 2020-03-24 MED FILL — GENTAMICIN 0.1% OINTMENT: 0.1 | 30 days supply | Qty: 30 | Fill #1

## 2020-03-24 MED FILL — TRELEGY ELLIPTA 200-62.5-25: 200-62.5-25 | 30 days supply | Qty: 60 | Fill #2

## 2020-03-24 MED FILL — TOPIRAMATE 25 MG TAB: 25 | 30 days supply | Qty: 60 | Fill #4
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
## 2020-03-25 ENCOUNTER — Other Ambulatory Visit: Payer: Self-pay | Admitting: Family Medicine

## 2020-03-25 ENCOUNTER — Encounter: Payer: Self-pay | Admitting: Family Medicine

## 2020-03-25 ENCOUNTER — Other Ambulatory Visit: Payer: Self-pay | Admitting: *Deleted

## 2020-03-25 MED ORDER — ESOMEPRAZOLE MAGNESIUM 40 MG PO CPDR: 40.0000 mg | DELAYED_RELEASE_CAPSULE | Freq: Every day | ORAL | 0 refills | Status: DC

## 2020-03-25 MED ORDER — LOSARTAN POTASSIUM 50 MG PO TABS: 50.0000 mg | ORAL_TABLET | Freq: Every day | ORAL | 0 refills | Status: DC

## 2020-03-25 MED ORDER — ESCITALOPRAM OXALATE 20 MG PO TABS: 20.0000 mg | ORAL_TABLET | Freq: Every day | ORAL | 2 refills | Status: DC

## 2020-03-25 MED FILL — ESCITALOPRAM 20 MG TABLET: 20 | 30 days supply | Qty: 30 | Fill #0

## 2020-03-25 MED FILL — ESOMEPRAZOLE MAG DR 40 MG C: 40 | 30 days supply | Qty: 30 | Fill #0
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
## 2020-03-31 ENCOUNTER — Telehealth: Payer: Self-pay | Admitting: *Deleted

## 2020-03-31 DIAGNOSIS — M79602 Pain in left arm: Secondary | ICD-10-CM | POA: Insufficient documentation

## 2020-03-31 NOTE — Telephone Encounter (Signed)
Submitted PA Ajovy on CMM.  Key: [id number] - PA Case ID: [id number]. Waiting on determination from [employer].

## 2020-03-31 NOTE — Telephone Encounter (Signed)
PA approved via [employer] 03/31/20-03/30/21 for a max of 12 fills. Approved for 1.62ml/30days. PA ref number: [id number].

## 2020-04-21 ENCOUNTER — Ambulatory Visit: Payer: 59 | Admitting: Pulmonary Disease

## 2020-04-21 ENCOUNTER — Other Ambulatory Visit: Payer: Self-pay

## 2020-04-21 ENCOUNTER — Encounter: Payer: Self-pay | Admitting: Pulmonary Disease

## 2020-04-21 VITALS — BP 126/78 | HR 65 | Temp 98.2°F | Ht 61.0 in | Wt 202.2 lb

## 2020-04-21 DIAGNOSIS — R05 Cough: Secondary | ICD-10-CM | POA: Diagnosis not present

## 2020-04-21 DIAGNOSIS — J3089 Other allergic rhinitis: Secondary | ICD-10-CM | POA: Diagnosis not present

## 2020-04-21 DIAGNOSIS — Z9989 Dependence on other enabling machines and devices: Secondary | ICD-10-CM

## 2020-04-21 DIAGNOSIS — J4540 Moderate persistent asthma, uncomplicated: Secondary | ICD-10-CM

## 2020-04-21 DIAGNOSIS — K219 Gastro-esophageal reflux disease without esophagitis: Secondary | ICD-10-CM

## 2020-04-21 DIAGNOSIS — I5189 Other ill-defined heart diseases: Secondary | ICD-10-CM | POA: Diagnosis not present

## 2020-04-21 DIAGNOSIS — G4733 Obstructive sleep apnea (adult) (pediatric): Secondary | ICD-10-CM

## 2020-04-21 DIAGNOSIS — R059 Cough, unspecified: Secondary | ICD-10-CM

## 2020-04-21 NOTE — Progress Notes (Signed)
Subjective:    Patient ID: [name], female    DOB: 1965/12/30, 54 y.o.   MRN: [id number]  HPI This is a 54 year old remote former smoker (more than 15 pack years) who presents for follow-up on the issue of cough and asthma.  Recall that she followed previously with Dr. Ashby Dawes.  Since Dr. Mathis Fare with departure I have assumed care.  She is however still seeing Dr. Ashby Dawes for her sleep issues.    He has issues with recurrent sinus infections and is following with ENT for this.  She was switched to Trelegy Ellipta 200/62.5/25 on her initial visit with me in January and she has done very well with this.  She does have some issues of nighttime cough that persist however, cough overall is better.  Her cough responds to albuterol in the evenings.  This is pretty much been her pattern.  There may be some postprandial component as she does have issues with reflux.  She does take omeprazole for this.  Echocardiogram performed in January showed increased left ventricular pressures with diastolic dysfunction and she is on diuretic therapy for this.  She has not had any fevers, chills or sweats.  She does note some exacerbation of her symptoms that are weather related.  For the most part if she is in a controlled climate environment she does well.  She has not had any productive cough cough for the most part nonproductive.  No hemoptysis.  Occasionally she will have yellow sputum.  No chest pain, occasional lower extremity edema.  No orthopnea or paroxysmal nocturnal dyspnea.  No calf tenderness.   Review of Systems A 10 point review of systems was performed and it is as noted above otherwise negative.  Allergies  Allergen Reactions  . Amitriptyline     Abnormal behavior. Just doesn't feel like normal self   . Fluticasone-Salmeterol Other (See Comments)    Thrush even with mouth rinsing; worsens cough  . Imitrex [Sumatriptan]     Made her head feel like its on fire  . Penicillins  Itching and Swelling    Has patient had a PCN reaction causing immediate rash, facial/tongue/throat swelling, SOB or lightheadedness with hypotension: No Has patient had a PCN reaction causing severe rash involving mucus membranes or skin necrosis: No Has patient had a PCN reaction that required hospitalization: No Has patient had a PCN reaction occurring within the last 10 years: Yes If all of the above answers are "NO", then may proceed with Cephalosporin use.   . Ciprofloxacin Itching and Rash  . Macrobid [Nitrofurantoin Monohyd Macro] Itching  . Sulfamethoxazole-Trimethoprim Rash   Current Meds  Medication Sig  . AJOVY 225 MG/1.5ML SOSY INJECT EVERY 30 DAY INTO THE SKIN  . albuterol (PROVENTIL) (2.5 MG/3ML) 0.083% nebulizer solution USE 1 VIAL VIA NEBULIZER EVERY 4 HOURS AS NEEDED  . albuterol (PROVENTIL) (2.5 MG/3ML) 0.083% nebulizer solution Take 3 mLs (2.5 mg total) by nebulization every 4 (four) hours as needed.  Marland Kitchen albuterol (VENTOLIN HFA) 108 (90 Base) MCG/ACT inhaler INHALE 2 PUFFS BY MOUTH EVERY 3-4 HOURS AS NEEDED FOR SHORTNESS OF BREATH\WHEEZING\RECURRENT COUGH  . azelastine (ASTELIN) 0.1 % nasal spray Place 1 spray into both nostrils 2 (two) times daily. Use in each nostril as directed  . baclofen (LIORESAL) 10 MG tablet Take 1 tablet (10 mg total) by mouth 3 (three) times daily as needed (headaches).  . benzonatate (TESSALON) 100 MG capsule Take 1 capsule (100 mg total) by mouth 2 (two) times daily  as needed for cough.  Marland Kitchen BIOTIN PO Take 2,000 mg by mouth daily.  . cetirizine (ZYRTEC) 10 MG tablet Take 1 tablet (10 mg total) daily by mouth.  . diclofenac (VOLTAREN) 75 MG EC tablet Take 1 tablet (75 mg total) by mouth 2 (two) times daily with a meal.  . EEMT HS 0.625-1.25 MG tablet TAKE 1 TABLET BY MOUTH DAILY.  Water engineer Bandages & Supports (SHOULDER BRACE LARGE) MISC 1 each by Does not apply route as needed.  Marland Kitchen escitalopram (LEXAPRO) 20 MG tablet Take 1 tablet (20 mg total) by  mouth at bedtime.  Marland Kitchen esomeprazole (NEXIUM) 40 MG capsule Take 1 capsule (40 mg total) by mouth daily.  . fluticasone (FLONASE) 50 MCG/ACT nasal spray PLACE 2 SPRAYS INTO THE NOSE DAILY.  Marland Kitchen Fluticasone-Umeclidin-Vilant (TRELEGY ELLIPTA) 200-62.5-25 MCG/INH AEPB Inhale 1 puff into the lungs daily.  . furosemide (LASIX) 20 MG tablet Take 1 tablet (20 mg total) by mouth daily.  Marland Kitchen gabapentin (NEURONTIN) 300 MG capsule TAKE 1 CAPSULE (300 MG TOTAL) BY MOUTH EVERY 8 (EIGHT) HOURS AS NEEDED FOR PAIN. CAN TAKE 2 CAPSULES AT NIGHT AS NEEDED FOR HOT FLASHES  . losartan (COZAAR) 50 MG tablet Take 1 tablet (50 mg total) by mouth daily.  . montelukast (SINGULAIR) 10 MG tablet Take 1 tablet (10 mg total) by mouth at bedtime.  . Multiple Vitamin (MULTIVITAMIN WITH MINERALS) TABS tablet Take 1 tablet by mouth daily.  . naproxen (NAPROSYN) 500 MG tablet TAKE 1 TABLET (500 MG TOTAL) BY MOUTH 2 (TWO) TIMES DAILY AS NEEDED FOR MILD PAIN OR MODERATE PAIN.  Marland Kitchen ondansetron (ZOFRAN) 4 MG tablet Take 1 tablet (4 mg total) by mouth every 8 (eight) hours as needed for nausea or vomiting.  Marland Kitchen oxybutynin (DITROPAN-XL) 10 MG 24 hr tablet TAKE 1 TABLET BY MOUTH AT BEDTIME  . phenazopyridine (PYRIDIUM) 200 MG tablet Take 1 tablet (200 mg total) by mouth 3 (three) times daily as needed for pain.  . pravastatin (PRAVACHOL) 40 MG tablet Take 1 tablet (40 mg total) by mouth daily. If muscle pain may take every other day.  . Pyridoxine HCl (VITAMIN B-6 PO) Take 1 tablet by mouth daily. Unknown OTC strength  . Spacer/Aero-Holding Chambers (AEROCHAMBER MV) inhaler Use as instructed  . topiramate (TOPAMAX) 25 MG tablet Take 1 tablet (25 mg total) by mouth 2 (two) times daily.  . vitamin C (ASCORBIC ACID) 500 MG tablet Take 500 mg by mouth daily.    Current Facility-Administered Medications for the 04/21/20 encounter (Office Visit) with Tyler Pita, MD  Medication  . 0.9 %  sodium chloride infusion   Immunization History   Administered Date(s) Administered  . Influenza Split 07/22/2016  . Influenza Whole 07/28/2008  . Influenza,inj,Quad PF,6+ Mos 07/29/2013, 07/13/2019  . Influenza-Unspecified 08/12/2014, 07/29/2015, 07/28/2017  . Pneumococcal Polysaccharide-23 08/01/2016  . Td 05/29/2002  . Tdap 09/05/2016   She has had COVID-19 vaccination however, will need to bring her information to validate.     Objective:   Physical Exam BP 126/78 (BP Location: Left Arm, Cuff Size: Normal)   Pulse 65   Temp 98.2 F (36.8 C) (Temporal)   Ht 5\' 1"  (1.549 m)   Wt 202 lb 3.2 oz (91.7 kg)   LMP 05/21/2017 (Within Weeks)   SpO2 97%   BMI 38.21 kg/m   GENERAL: Well-developed, obese woman, no respiratory distress.  Fully ambulatory. HEAD: Normocephalic, atraumatic.  EYES: Pupils equal, round, reactive to light.  No scleral icterus.  MOUTH:  Nose/mouth/throat not examined due to masking requirements for COVID 19. NECK: Supple. No thyromegaly. Trachea midline. No JVD.  No adenopathy. PULMONARY: Lungs clear to auscultation bilaterally.  No wheezes noted. CARDIOVASCULAR: S1 and S2. Regular rate and rhythm.  No rubs, murmurs or gallops heard.  GASTROINTESTINAL: Obese abdomen, nondistended. MUSCULOSKELETAL: No joint deformity, no clubbing, no edema.  NEUROLOGIC: No gait disturbance noted.  No overt focal deficits.  Fluent speech, awake and alert.   SKIN: Intact,warm,dry.  On limited exam, no rashes PSYCH: Mood and behavior are normal.      Assessment & Plan:     ICD-10-CM   1. Moderate persistent asthma in adult without complication  G18.29 Pulmonary Function Test [facility] Only    CBC w/Diff    Allergen Panel (27) + IGE   Fairly well compensated, continue Trelegy Albuterol in the evening if cough develops We will check RAST panel Reassess with PFTs  2. Cough  R05    Related to her asthma Element of postnasal drip aggravating issue Element of GERD with LPR  3. Perennial allergic rhinitis  J30.89    Continue  nasal hygiene Continue follow-up with ENT  4. Gastroesophageal reflux disease, unspecified whether esophagitis present  K21.9    Antireflux measures Continue omeprazole  5. Diastolic dysfunction  H37.16    On diuretic This issue adds complexity to her management  6. OSA on CPAP  G47.33    Z99.89    Follows with Dr. Ashby Dawes To new CPAP per his recommendations   Orders Placed This Encounter  Procedures  . CBC w/Diff    Standing Status:   Future    Standing Expiration Date:   04/21/2021  . Allergen Panel (27) + IGE    Standing Status:   Future    Standing Expiration Date:   04/21/2021  . Pulmonary Function Test [facility] Only    Standing Status:   Future    Standing Expiration Date:   04/21/2021    Scheduling Instructions:     Next available.    Order Specific Question:   Full PFT: includes the following: basic spirometry, spirometry pre & post bronchodilator, diffusion capacity (DLCO), lung volumes    Answer:   Full PFT   Discussion:  Overall the patient is doing better.  She still has bouts of coughing in the evening.  We will reassess with an allergy panel and also reassess her pulmonary function with PFTs.  She is to continue Trelegy Ellipta.  Use albuterol in the evenings if she has issues with cough.  We will also obtain CBC to evaluate for potential eosinophilia.  Continue antireflux measures as well as GERD can aggravate the above issues.  We will see the patient in follow-up in 3 months time she is to contact us prior to that time should any new difficulties arise.  Renold Don, MD [facility]   *This note was dictated using voice recognition software/Dragon.  Despite best efforts to proofread, errors can occur which can change the meaning.  Any change was purely unintentional.

## 2020-04-21 NOTE — Patient Instructions (Signed)
What we discussed today:   Continue your Trelegy Ellipta 1 inhalation daily  We are going to schedule breathing tests  We are going check allergy tests  Continue following with Dr. Ashby Dawes for your sleep apnea  We will see him follow-up in 3 months time call sooner should any new difficulties arise

## 2020-05-03 ENCOUNTER — Ambulatory Visit: Payer: 59

## 2020-05-06 ENCOUNTER — Telehealth: Payer: Self-pay

## 2020-05-06 NOTE — Telephone Encounter (Signed)
Pt is aware of date/time of covid test prior to PFT. Pt voiced her understanding.  Nothing further is needed.

## 2020-05-09 ENCOUNTER — Other Ambulatory Visit: Payer: Self-pay

## 2020-05-09 ENCOUNTER — Other Ambulatory Visit
Admission: RE | Admit: 2020-05-09 | Discharge: 2020-05-09 | Disposition: A | Discharge status: Home / Self Care | Payer: 59 | Source: Ambulatory Visit | Attending: Pulmonary Disease | Admitting: Pulmonary Disease

## 2020-05-09 DIAGNOSIS — Z20822 Contact with and (suspected) exposure to covid-19: Secondary | ICD-10-CM | POA: Insufficient documentation

## 2020-05-09 DIAGNOSIS — Z01812 Encounter for preprocedural laboratory examination: Secondary | ICD-10-CM | POA: Insufficient documentation

## 2020-05-09 LAB — SARS CORONAVIRUS 2 (TAT 6-24 HRS): SARS Coronavirus 2: NEGATIVE

## 2020-05-10 ENCOUNTER — Ambulatory Visit: Payer: 59

## 2020-05-10 ENCOUNTER — Telehealth: Payer: Self-pay | Admitting: Pulmonary Disease

## 2020-05-10 NOTE — Telephone Encounter (Signed)
Pt scheduled PFT for 05/17/20 at 2:15 p.m. at [facility]. Covid test to be done on 05/16/2020. Info mailed to patient. Called and spoke to pt and she is aware of covid test time/date. Nothing else needed at this time. Rhonda J Cobb

## 2020-05-10 NOTE — Telephone Encounter (Signed)
PFT has been cancelled per patient's request. Since time and date were not available that pt requested, pt is given phone number to cardiopulmonary to r/s appt to coordinate w/ her schedule. Rhonda J Cobb

## 2020-05-13 NOTE — Telephone Encounter (Signed)
Pt is aware of date/time of covid test and voiced her understanding.

## 2020-05-16 ENCOUNTER — Other Ambulatory Visit: Payer: Self-pay

## 2020-05-16 ENCOUNTER — Other Ambulatory Visit
Admission: RE | Admit: 2020-05-16 | Discharge: 2020-05-16 | Disposition: A | Discharge status: Home / Self Care | Payer: 59 | Source: Ambulatory Visit | Attending: Pulmonary Disease | Admitting: Pulmonary Disease

## 2020-05-16 DIAGNOSIS — Z20822 Contact with and (suspected) exposure to covid-19: Secondary | ICD-10-CM | POA: Diagnosis not present

## 2020-05-16 DIAGNOSIS — Z01812 Encounter for preprocedural laboratory examination: Secondary | ICD-10-CM | POA: Insufficient documentation

## 2020-05-16 LAB — SARS CORONAVIRUS 2 (TAT 6-24 HRS): SARS Coronavirus 2: NEGATIVE

## 2020-05-17 ENCOUNTER — Ambulatory Visit: Payer: 59 | Attending: Pulmonary Disease

## 2020-05-17 DIAGNOSIS — J4540 Moderate persistent asthma, uncomplicated: Secondary | ICD-10-CM | POA: Insufficient documentation

## 2020-05-17 DIAGNOSIS — J984 Other disorders of lung: Secondary | ICD-10-CM | POA: Insufficient documentation

## 2020-05-17 MED ORDER — ALBUTEROL SULFATE (2.5 MG/3ML) 0.083% IN NEBU
2.5000 mg | INHALATION_SOLUTION | Freq: Once | RESPIRATORY_TRACT | Status: AC
  Administered 2020-05-17: 2.5 mg via RESPIRATORY_TRACT
  Filled 2020-05-17: qty 3

## 2020-05-19 DIAGNOSIS — J45901 Unspecified asthma with (acute) exacerbation: Secondary | ICD-10-CM | POA: Diagnosis not present

## 2020-05-19 DIAGNOSIS — G4733 Obstructive sleep apnea (adult) (pediatric): Secondary | ICD-10-CM | POA: Diagnosis not present

## 2020-06-06 ENCOUNTER — Other Ambulatory Visit: Payer: Self-pay

## 2020-06-06 ENCOUNTER — Encounter: Payer: Self-pay | Admitting: Family Medicine

## 2020-06-06 ENCOUNTER — Ambulatory Visit: Payer: 59 | Admitting: Family Medicine

## 2020-06-06 ENCOUNTER — Other Ambulatory Visit: Payer: Self-pay | Admitting: Internal Medicine

## 2020-06-06 VITALS — BP 152/90 | HR 88 | Ht 61.0 in | Wt 206.0 lb

## 2020-06-06 DIAGNOSIS — M5440 Lumbago with sciatica, unspecified side: Secondary | ICD-10-CM | POA: Diagnosis not present

## 2020-06-06 MED ORDER — OXYBUTYNIN CHLORIDE ER 10 MG PO TB24: 10.0000 mg | ORAL_TABLET | Freq: Every day | ORAL | 2 refills | Status: DC

## 2020-06-06 MED ORDER — LOSARTAN POTASSIUM 50 MG PO TABS: 50.0000 mg | ORAL_TABLET | Freq: Every day | ORAL | 0 refills | Status: DC

## 2020-06-06 MED FILL — ALBUTEROL 0.083% INHAL SOLN: (2.5 MG/3ML | 4 days supply | Qty: 75 | Fill #2

## 2020-06-06 MED FILL — OXYBUTYNIN CL ER 10 MG TAB: 10 | 30 days supply | Qty: 30 | Fill #0

## 2020-06-06 MED FILL — ESOMEPRAZOLE MAG DR 40 MG C: 40 | 30 days supply | Qty: 30 | Fill #0

## 2020-06-06 MED FILL — ESCITALOPRAM 20 MG TABLET: 20 | 30 days supply | Qty: 30 | Fill #0

## 2020-06-06 MED FILL — GENTAMICIN 0.1% OINTMENT: 0.1 | 30 days supply | Qty: 30 | Fill #1

## 2020-06-06 MED FILL — TRELEGY ELLIPTA 200-62.5-25: 200-62.5-25 | 30 days supply | Qty: 60 | Fill #3

## 2020-06-06 MED FILL — LOSARTAN POTASSIUM 50 MG TA: 50 | 90 days supply | Qty: 90 | Fill #0
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
## 2020-06-06 NOTE — Patient Instructions (Signed)
It was a pleasure to see you today!  Thank you for choosing [facility] for your primary care.  [name] was seen for back pain and paperwork.  Our plans for today were:  I have sent a referral into orthopedic physicians to evaluate you.  Please call us if you have not heard from them in 2 weeks.  Also recommended she is increase the frequency of taking your gabapentin to twice daily.  If you find this is a helpful you can increase to 3 times daily.   Sent refills for your medications to your pharmacy.  I will also send a referral for physical therapy.   You should return to our clinic after you are able to follow-up with the orthopedic referral.  Best Wishes,   Dr. Alba Cory

## 2020-06-07 ENCOUNTER — Other Ambulatory Visit: Payer: Self-pay | Admitting: Internal Medicine

## 2020-06-07 ENCOUNTER — Telehealth: Payer: Self-pay | Admitting: Family Medicine

## 2020-06-07 MED FILL — ALBUTEROL SULFATE HFA 108 (: 108 (90 BAS | 12 days supply | Qty: 7 | Fill #0

## 2020-06-07 NOTE — Telephone Encounter (Signed)
Patient requested copy of FMLA paperwork. I have copied paperwork and placed upfront for patient to pick up. Please call and inform her that this is ready for her to pick up.   Thank you,  Brelan Hannen Simmons-Robinson

## 2020-06-08 DIAGNOSIS — M5440 Lumbago with sciatica, unspecified side: Secondary | ICD-10-CM | POA: Insufficient documentation

## 2020-06-08 NOTE — Assessment & Plan Note (Signed)
Based on symptoms described patient likely has sciatica or radiculopathy in setting of joint disease. Review of chart shows long history of back pain.  - referral to PT  - referral to orthopedics

## 2020-06-08 NOTE — Telephone Encounter (Signed)
Informed patient.  [name], [name]

## 2020-06-17 ENCOUNTER — Ambulatory Visit: Payer: 59 | Admitting: Family Medicine

## 2020-06-27 ENCOUNTER — Ambulatory Visit: Payer: 59 | Attending: Family Medicine

## 2020-06-27 ENCOUNTER — Other Ambulatory Visit: Payer: Self-pay

## 2020-06-27 DIAGNOSIS — M25652 Stiffness of left hip, not elsewhere classified: Secondary | ICD-10-CM | POA: Diagnosis not present

## 2020-06-27 DIAGNOSIS — M25651 Stiffness of right hip, not elsewhere classified: Secondary | ICD-10-CM | POA: Diagnosis not present

## 2020-06-27 DIAGNOSIS — M5440 Lumbago with sciatica, unspecified side: Secondary | ICD-10-CM | POA: Insufficient documentation

## 2020-06-27 DIAGNOSIS — M62830 Muscle spasm of back: Secondary | ICD-10-CM | POA: Diagnosis not present

## 2020-06-27 DIAGNOSIS — M2560 Stiffness of unspecified joint, not elsewhere classified: Secondary | ICD-10-CM | POA: Diagnosis not present

## 2020-06-27 DIAGNOSIS — R293 Abnormal posture: Secondary | ICD-10-CM | POA: Diagnosis not present
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
## 2020-06-27 NOTE — Patient Instructions (Signed)
PPT x 10 supine or standing 2x/day Knee to chest and cross boy piriformis stretch x 2-3 30 sec 2x/day LT Sleeping with pillow under knees Belt around pelvis with standing and wlaking

## 2020-06-28 ENCOUNTER — Ambulatory Visit (INDEPENDENT_AMBULATORY_CARE_PROVIDER_SITE_OTHER): Payer: 59 | Admitting: Family Medicine

## 2020-06-28 ENCOUNTER — Encounter: Payer: Self-pay | Admitting: Family Medicine

## 2020-06-28 DIAGNOSIS — M5442 Lumbago with sciatica, left side: Secondary | ICD-10-CM
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
## 2020-07-06 ENCOUNTER — Ambulatory Visit: Payer: 59 | Attending: Family Medicine | Admitting: Physical Therapy

## 2020-07-07 ENCOUNTER — Telehealth: Payer: Self-pay | Admitting: Physical Therapy

## 2020-07-07 NOTE — Telephone Encounter (Signed)
Spoke to patient regarding no show to physical therapy appointment. She stated that she forgot she had to work. She would also like to cancel her appointments until she has her MRI and follows up with MD.

## 2020-07-13 ENCOUNTER — Ambulatory Visit: Payer: 59 | Admitting: Physical Therapy

## 2020-07-14 ENCOUNTER — Ambulatory Visit: Payer: 59 | Admitting: Physical Therapy

## 2020-07-19 ENCOUNTER — Encounter: Payer: 59 | Admitting: Physical Therapy

## 2020-07-19 ENCOUNTER — Other Ambulatory Visit: Payer: Self-pay

## 2020-07-19 ENCOUNTER — Ambulatory Visit: Payer: 59 | Admitting: Pulmonary Disease

## 2020-07-19 ENCOUNTER — Encounter: Payer: Self-pay | Admitting: Pulmonary Disease

## 2020-07-19 ENCOUNTER — Other Ambulatory Visit
Admission: RE | Admit: 2020-07-19 | Discharge: 2020-07-19 | Disposition: A | Discharge status: Home / Self Care | Payer: 59 | Attending: Pulmonary Disease | Admitting: Pulmonary Disease

## 2020-07-19 VITALS — BP 144/80 | HR 83 | Temp 97.3°F | Ht 61.0 in | Wt 203.2 lb

## 2020-07-19 DIAGNOSIS — K219 Gastro-esophageal reflux disease without esophagitis: Secondary | ICD-10-CM

## 2020-07-19 DIAGNOSIS — J4540 Moderate persistent asthma, uncomplicated: Secondary | ICD-10-CM | POA: Insufficient documentation

## 2020-07-19 DIAGNOSIS — Z9989 Dependence on other enabling machines and devices: Secondary | ICD-10-CM | POA: Diagnosis not present

## 2020-07-19 DIAGNOSIS — R05 Cough: Secondary | ICD-10-CM

## 2020-07-19 DIAGNOSIS — G4733 Obstructive sleep apnea (adult) (pediatric): Secondary | ICD-10-CM | POA: Diagnosis not present

## 2020-07-19 DIAGNOSIS — R059 Cough, unspecified: Secondary | ICD-10-CM

## 2020-07-19 LAB — CBC WITH DIFFERENTIAL/PLATELET
Abs Immature Granulocytes: 0.02 10*3/uL (ref 0.00–0.07)
Basophils Absolute: 0.1 10*3/uL (ref 0.0–0.1)
Basophils Relative: 1 %
Eosinophils Absolute: 0.1 10*3/uL (ref 0.0–0.5)
Eosinophils Relative: 2 %
HCT: 36.8 % (ref 36.0–46.0)
Hemoglobin: 12.3 g/dL (ref 12.0–15.0)
Immature Granulocytes: 0 %
Lymphocytes Relative: 29 %
Lymphs Abs: 1.9 10*3/uL (ref 0.7–4.0)
MCH: 31.1 pg (ref 26.0–34.0)
MCHC: 33.4 g/dL (ref 30.0–36.0)
MCV: 93.2 fL (ref 80.0–100.0)
Monocytes Absolute: 0.6 10*3/uL (ref 0.1–1.0)
Monocytes Relative: 9 %
Neutro Abs: 3.8 10*3/uL (ref 1.7–7.7)
Neutrophils Relative %: 59 %
Platelets: 264 10*3/uL (ref 150–400)
RBC: 3.95 MIL/uL (ref 3.87–5.11)
RDW: 12.7 % (ref 11.5–15.5)
WBC: 6.5 10*3/uL (ref 4.0–10.5)
nRBC: 0 % (ref 0.0–0.2)

## 2020-07-19 NOTE — Patient Instructions (Signed)
Make sure you get your blood work done.   Take Nexium in the morning and then Pepcid in the evening you can take up to 2 tablets of Pepcid (40 mg) at least for a few days and then decrease to 1 tablet if you are doing better.   Use your rescue inhaler before you go to bed to see if that helps with reducing the cough.    We will see him in follow-up in 2 to 3 months time call sooner should any new problems arise.

## 2020-07-19 NOTE — Progress Notes (Signed)
Subjective:    Patient ID: [name], female    DOB: October 19, 1966, 54 y.o.   MRN: [id number]  HPI [name] is a 54 year old very remote former smoker who presents for follow-up on the issue of moderate persistent asthma with cough variant.  She also has issues with gastroesophageal reflux.  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  Cough had been better controlled however with her longer hours she has noted recurrence of the cough.  She also has had some issues with back pain and normally she sleeps on her left side but cannot do this now due to the back pain and notices that she coughs more on her right side.  This is indicative of worsening issues with reflux.  Usually a left-sided position helps keep reflux down.  She has not had any fevers, chills or sweats.  Cough has been mostly nonproductive.  She is to see orthopedics with regards to her back issues.  She is doing overall very well with the Trelegy.  Feels that this medication has been the best she has had for her asthma.  She is also trying to lose weight.  She voices no other complaint.  Dyspnea has been better overall.  She has not gotten her CBC with differential and allergen panel as requested previously.  We will go ahead and get this today.  Review of Systems A 10 point review of systems was performed and it is as noted above otherwise negative.  Allergies  Allergen Reactions  . Amitriptyline     Abnormal behavior. Just doesn't feel like normal self   . Fluticasone-Salmeterol Other (See Comments)    Thrush even with mouth rinsing; worsens cough  . Imitrex [Sumatriptan]     Made her head feel like its on fire  . Penicillins Itching and Swelling    Has patient had a PCN reaction causing immediate rash, facial/tongue/throat swelling, SOB or lightheadedness with hypotension: No Has patient had a PCN reaction causing severe rash involving mucus membranes or skin necrosis: No Has  patient had a PCN reaction that required hospitalization: No Has patient had a PCN reaction occurring within the last 10 years: Yes If all of the above answers are "NO", then may proceed with Cephalosporin use.   . Ciprofloxacin Itching and Rash  . Macrobid [Nitrofurantoin Monohyd Macro] Itching  . Sulfamethoxazole-Trimethoprim Rash   Current Meds  Medication Sig  . AJOVY 225 MG/1.5ML SOSY INJECT EVERY 30 DAY INTO THE SKIN  . albuterol (PROVENTIL) (2.5 MG/3ML) 0.083% nebulizer solution USE 1 VIAL VIA NEBULIZER EVERY 4 HOURS AS NEEDED  . albuterol (PROVENTIL) (2.5 MG/3ML) 0.083% nebulizer solution Take 3 mLs (2.5 mg total) by nebulization every 4 (four) hours as needed.  Marland Kitchen albuterol (VENTOLIN HFA) 108 (90 Base) MCG/ACT inhaler INHALE 2 PUFFS BY MOUTH EVERY 3 TO 4 HOURS AS NEEDED FOR SHORTNESS OF BREATH/WHEEZING/RECURRENT COUGH  . azelastine (ASTELIN) 0.1 % nasal spray Place 1 spray into both nostrils 2 (two) times daily. Use in each nostril as directed  . benzonatate (TESSALON) 100 MG capsule Take 1 capsule (100 mg total) by mouth 2 (two) times daily as needed for cough.  Marland Kitchen BIOTIN PO Take 2,000 mg by mouth daily.  . cetirizine (ZYRTEC) 10 MG tablet Take 1 tablet (10 mg total) daily by mouth.  Water engineer Bandages & Supports (SHOULDER BRACE LARGE) MISC 1 each by Does not apply route as needed.  Marland Kitchen  escitalopram (LEXAPRO) 20 MG tablet Take 1 tablet (20 mg total) by mouth at bedtime.  Marland Kitchen esomeprazole (NEXIUM) 40 MG capsule Take 1 capsule (40 mg total) by mouth daily.  . fluticasone (FLONASE) 50 MCG/ACT nasal spray PLACE 2 SPRAYS INTO THE NOSE DAILY.  Marland Kitchen Fluticasone-Umeclidin-Vilant (TRELEGY ELLIPTA) 200-62.5-25 MCG/INH AEPB Inhale 1 puff into the lungs daily.  . furosemide (LASIX) 20 MG tablet Take 1 tablet (20 mg total) by mouth daily.  Marland Kitchen gabapentin (NEURONTIN) 300 MG capsule TAKE 1 CAPSULE (300 MG TOTAL) BY MOUTH EVERY 8 (EIGHT) HOURS AS NEEDED FOR PAIN. CAN TAKE 2 CAPSULES AT NIGHT AS NEEDED FOR HOT  FLASHES  . losartan (COZAAR) 50 MG tablet Take 1 tablet (50 mg total) by mouth daily.  . Multiple Vitamin (MULTIVITAMIN WITH MINERALS) TABS tablet Take 1 tablet by mouth daily.  . naproxen (NAPROSYN) 500 MG tablet TAKE 1 TABLET (500 MG TOTAL) BY MOUTH 2 (TWO) TIMES DAILY AS NEEDED FOR MILD PAIN OR MODERATE PAIN.  Marland Kitchen ondansetron (ZOFRAN) 4 MG tablet Take 1 tablet (4 mg total) by mouth every 8 (eight) hours as needed for nausea or vomiting.  Marland Kitchen oxybutynin (DITROPAN-XL) 10 MG 24 hr tablet Take 1 tablet (10 mg total) by mouth at bedtime.  . Pyridoxine HCl (VITAMIN B-6 PO) Take 1 tablet by mouth daily. Unknown OTC strength  . Spacer/Aero-Holding Chambers (AEROCHAMBER MV) inhaler Use as instructed  . topiramate (TOPAMAX) 25 MG tablet Take 1 tablet (25 mg total) by mouth 2 (two) times daily.  . vitamin C (ASCORBIC ACID) 500 MG tablet Take 500 mg by mouth daily.    Immunization History  Administered Date(s) Administered  . Influenza Split 07/22/2016  . Influenza Whole 07/28/2008  . Influenza,inj,Quad PF,6+ Mos 07/29/2013, 07/13/2019  . Influenza-Unspecified 08/12/2014, 07/29/2015, 07/28/2017  . Moderna SARS-COVID-2 Vaccination 10/21/2019, 11/09/2019  . Pneumococcal Polysaccharide-23 08/01/2016  . Td 05/29/2002  . Tdap 09/05/2016        Objective:   Physical Exam BP (!) 144/80 (BP Location: Left Arm, Cuff Size: Normal)   Pulse 83   Temp (!) 97.3 F (36.3 C) (Temporal)   Ht 5\' 1"  (1.549 m)   Wt 203 lb 3.2 oz (92.2 kg)   LMP 05/21/2017 (Exact Date)   SpO2 99%   BMI 38.39 kg/m  GENERAL: Well-developed, obese woman, no respiratory distress.  Fully ambulatory. HEAD: Normocephalic, atraumatic.  EYES: Pupils equal, round, reactive to light.  No scleral icterus.  MOUTH: Nose/mouth/throat not examined due to masking requirements for COVID 19. NECK: Supple. No thyromegaly. Trachea midline. No JVD.  No adenopathy. PULMONARY: Lungs clear to auscultation bilaterally.  No wheezes  noted. CARDIOVASCULAR: S1 and S2. Regular rate and rhythm.  No rubs, murmurs or gallops heard.  GASTROINTESTINAL: Obese abdomen, nondistended. MUSCULOSKELETAL: No joint deformity, no clubbing, no edema.  NEUROLOGIC: No gait disturbance noted.  No overt focal deficits.  Fluent speech, awake and alert.   SKIN: Intact,warm,dry.  On limited exam, no rashes PSYCH: Mood and behavior are normal.       Assessment & Plan:     ICD-10-CM   1. Moderate persistent asthma in adult without complication  K44.01 Allergen Panel (27) + IGE    CBC w/Diff   Fairly well controlled on Trelegy Still with some issues of cough at bedtime Use albuterol at bedtime Allergen panel as previous  2. Cough  R05    Exacerbates in the evenings Suspect due to reflux  3. Gastroesophageal reflux disease, unspecified whether esophagitis present  K21.9  She has continued problems with reflux Nexium not fully effective Take Nexium in the morning and Pepcid in the evening Discuss with primary care  4. OSA on CPAP  G47.33    Z99.89    She still follows with Dr. Ashby Dawes for this issue This is via virtual visits   Orders Placed This Encounter  Procedures  . Allergen Panel (27) + IGE    Standing Status:   Future    Standing Expiration Date:   07/19/2021  . CBC w/Diff    Standing Status:   Future    Standing Expiration Date:   07/19/2021   Discussion:  Patient has moderate persistent asthma, elements of cough variant asthma.  Suspect her evening cough exacerbation is due to reflux.  This is gleaned from her history as noted above.  Recommend adding Pepcid at bedtime and continue Nexium during the day.  May need referral to GI, I have advised that she discuss this with primary care.  Also recommended to take albuterol prior to bedtime.  She has not yet obtained CBC and allergen panel as previously ordered.  She will get this today.  We will see her in follow-up in 2 to 3 weeks time she is to contact us prior to that  time she may new difficulties arise.   Renold Don, MD [facility]   *This note was dictated using voice recognition software/Dragon.  Despite best efforts to proofread, errors can occur which can change the meaning.  Any change was purely unintentional.

## 2020-07-20 ENCOUNTER — Ambulatory Visit
Admission: RE | Admit: 2020-07-20 | Discharge: 2020-07-20 | Disposition: A | Discharge status: Home / Self Care | Payer: Self-pay | Source: Ambulatory Visit | Attending: Family Medicine | Admitting: Family Medicine

## 2020-07-20 DIAGNOSIS — M48061 Spinal stenosis, lumbar region without neurogenic claudication: Secondary | ICD-10-CM | POA: Diagnosis not present

## 2020-07-20 DIAGNOSIS — M5442 Lumbago with sciatica, left side: Secondary | ICD-10-CM

## 2020-07-21 ENCOUNTER — Telehealth: Payer: Self-pay | Admitting: Family Medicine

## 2020-07-21 ENCOUNTER — Encounter: Payer: 59 | Admitting: Physical Therapy

## 2020-07-21 DIAGNOSIS — M5442 Lumbago with sciatica, left side: Secondary | ICD-10-CM

## 2020-07-21 LAB — ALLERGEN PANEL (27) + IGE
Alternaria Alternata IgE: <0.1 kU/L
Aspergillus Fumigatus IgE: <0.1 kU/L
Bahia Grass IgE: 0.79 kU/L — AB
Bermuda Grass IgE: <0.1 kU/L
Cat Dander IgE: <0.1 kU/L
Cedar, Mountain IgE: <0.1 kU/L
Cladosporium Herbarum IgE: <0.1 kU/L
Cocklebur IgE: <0.1 kU/L
Cockroach, American IgE: <0.1 kU/L
Common Silver Birch IgE: <0.1 kU/L
D Farinae IgE: <0.1 kU/L
D Pteronyssinus IgE: <0.1 kU/L
Dog Dander IgE: 6.23 kU/L — AB
Elm, American IgE: <0.1 kU/L
Hickory, White IgE: 0.11 kU/L — AB
IgE (Immunoglobulin E), Serum: 50 IU/mL (ref 6–495)
Johnson Grass IgE: 0.45 kU/L — AB
Kentucky Bluegrass IgE: 6.19 kU/L — AB
Maple/Box Elder IgE: <0.1 kU/L
Mucor Racemosus IgE: <0.1 kU/L
Oak, White IgE: <0.1 kU/L
Penicillium Chrysogen IgE: <0.1 kU/L
Pigweed, Rough IgE: <0.1 kU/L
Plantain, English IgE: <0.1 kU/L
Ragweed, Short IgE: <0.1 kU/L
Setomelanomma Rostrat: <0.1 kU/L
Timothy Grass IgE: 3.64 kU/L — AB
White Mulberry IgE: <0.1 kU/L
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
## 2020-07-21 NOTE — Telephone Encounter (Signed)
Left voice mail to call me back regarding MRI results.

## 2020-07-25 NOTE — Telephone Encounter (Signed)
I called and advised the patient of the results and options. She said the chiropractic care aggravated her back more. She would like to resume PT and be referred to Dr. Ernestina Patches. The patient said she would call the PT facility, herself.

## 2020-07-25 NOTE — Addendum Note (Signed)
Addended by: Hortencia Pilar on: 07/25/2020 11:27 AM   Modules accepted: Orders

## 2020-07-25 NOTE — Telephone Encounter (Signed)
Orders placed.

## 2020-07-27 ENCOUNTER — Encounter: Payer: 59 | Admitting: Physical Therapy

## 2020-08-17 DIAGNOSIS — J45901 Unspecified asthma with (acute) exacerbation: Secondary | ICD-10-CM | POA: Diagnosis not present

## 2020-08-17 DIAGNOSIS — G4733 Obstructive sleep apnea (adult) (pediatric): Secondary | ICD-10-CM | POA: Diagnosis not present

## 2020-08-19 ENCOUNTER — Ambulatory Visit (INDEPENDENT_AMBULATORY_CARE_PROVIDER_SITE_OTHER): Payer: 59 | Admitting: Family Medicine

## 2020-08-19 ENCOUNTER — Other Ambulatory Visit: Payer: Self-pay | Admitting: Family Medicine

## 2020-08-19 ENCOUNTER — Other Ambulatory Visit: Payer: Self-pay

## 2020-08-19 ENCOUNTER — Other Ambulatory Visit (HOSPITAL_COMMUNITY)
Admission: RE | Admit: 2020-08-19 | Discharge: 2020-08-19 | Disposition: A | Discharge status: Home / Self Care | Payer: 59 | Source: Ambulatory Visit | Attending: Family Medicine | Admitting: Family Medicine

## 2020-08-19 VITALS — BP 118/64 | HR 81 | Wt 202.0 lb

## 2020-08-19 DIAGNOSIS — R300 Dysuria: Secondary | ICD-10-CM | POA: Diagnosis not present

## 2020-08-19 DIAGNOSIS — N3001 Acute cystitis with hematuria: Secondary | ICD-10-CM | POA: Insufficient documentation

## 2020-08-19 LAB — POCT WET PREP (WET MOUNT)
Clue Cells Wet Prep Whiff POC: NEGATIVE
Trichomonas Wet Prep HPF POC: ABSENT

## 2020-08-19 LAB — POCT URINALYSIS DIP (MANUAL ENTRY)
Blood, UA: NEGATIVE
Glucose, UA: NEGATIVE mg/dL
Nitrite, UA: NEGATIVE
Protein Ur, POC: 30 mg/dL — AB
Spec Grav, UA: >=1.03 — AB (ref 1.010–1.025)
Urobilinogen, UA: NEGATIVE E.U./dL — AB
pH, UA: 5 (ref 5.0–8.0)

## 2020-08-19 MED ORDER — CEPHALEXIN 500 MG PO CAPS: 500.0000 mg | ORAL_CAPSULE | Freq: Four times a day (QID) | ORAL | 0 refills | Status: DC

## 2020-08-19 MED FILL — ALBUTEROL SULFATE HFA 108 (: 108 (90 BAS | 12 days supply | Qty: 7 | Fill #0

## 2020-08-19 MED FILL — LOSARTAN POTASSIUM 50 MG TA: 50 | 90 days supply | Qty: 90 | Fill #0

## 2020-08-19 MED FILL — ALBUTEROL 0.083% INHAL SOLN: (2.5 MG/3ML | 4 days supply | Qty: 75 | Fill #3

## 2020-08-19 MED FILL — CEPHALEXIN 500 MG CAPSULE: 500 | 5 days supply | Qty: 20 | Fill #0

## 2020-08-19 MED FILL — TRELEGY ELLIPTA 200-62.5-25: 200-62.5-25 | 30 days supply | Qty: 60 | Fill #4

## 2020-08-19 NOTE — Assessment & Plan Note (Addendum)
UA returned with negative nitrite, pending leukocytes.  U micro with many bacteria.  Given patient's symptoms, will treat as urinary tract infection with Keflex 4 times daily x5 days. Penicillin allergy listed, however, patient given Keflex multiple times in the past without issue. Patient does not have any fevers or chills. On physical exam, she is overall well-appearing without CVA tenderness. Low concern for pyelonephritis at this time.  Also obtaining wet prep. As well as GC chlamydia per patient request.  Return precautions provided

## 2020-08-19 NOTE — Progress Notes (Signed)
    SUBJECTIVE:   CHIEF COMPLAINT / HPI:   Dysuria Patient reports that she has been having symptoms of dysuria x2 weeks.  This includes burning all pain, frequency and urgency.  She has not seen anybody for this as she has been waiting to get into the clinic.  She denies any blood in her urine but does report that her urine appears dark.  She reports any fevers, chills, vomiting.  She does note an episode of nausea and diaphoresis the other night.  She also does note some lower back pain on her left side. Patient does not have any history of kidney stones, no new sexual encounters, denies any vaginal rash, abdominal order, discharge.  She is status post hysterectomy.  Patient request to be tested for gonorrhea chlamydia today.  She is up-to-date on her Pap smear. Previous urine cultures have shown mixed urogenital flora and GBS.  She had a urine culture in 2018 that was pansensitive E. coli with 10-25,000 CFU's.  PERTINENT  PMH / PSH: hypertension, prediabetes   OBJECTIVE:   BP 118/64   Pulse 81   Wt 202 lb (91.6 kg)   LMP 05/21/2017 (Exact Date)   SpO2 94%   BMI 38.17 kg/m   General: Well-appearing nontoxic Abdomen: Mild tenderness to palpation of the pelvic area.  No CVA tenderness. GU: External vulva and vagina nonerythematous, without any obvious lesions or rash.    No abnormal discharge appreciated. Normal ruggae of vaginal walls.  Cervix is non erythematous and non-friable.    ASSESSMENT/PLAN:   Dysuria UA returned with negative nitrite, pending leukocytes.  U micro with many bacteria.  Given patient's symptoms, will treat as urinary tract infection with Keflex 4 times daily x5 days. Penicillin allergy listed, however, patient given Keflex multiple times in the past without issue. Patient does not have any fevers or chills. On physical exam, she is overall well-appearing without CVA tenderness. Low concern for pyelonephritis at this time.  Also obtaining wet prep. As well as GC  chlamydia per patient request.  Return precautions provided    Wilber Oliphant, MD [place]

## 2020-08-19 NOTE — Patient Instructions (Signed)
I have sent you antibiotics to the pharmacy. Please take the antibiotic called Keflex 4 times a day for 5 days. If you do not have any improvement in symptoms in 2 to 3 days, please call the office. Additionally, we have also obtain some other testing. If these labs come back positive, I will call you. Otherwise, I will send you a letter of normal results.

## 2020-08-22 ENCOUNTER — Other Ambulatory Visit: Payer: Self-pay | Admitting: *Deleted

## 2020-08-22 DIAGNOSIS — R102 Pelvic and perineal pain: Secondary | ICD-10-CM

## 2020-08-22 MED ORDER — ESOMEPRAZOLE MAGNESIUM 40 MG PO CPDR
40.0000 mg | DELAYED_RELEASE_CAPSULE | Freq: Every day | ORAL | 0 refills | Status: DC
Start: 2020-08-22 — End: 2020-11-21

## 2020-08-22 MED ORDER — GABAPENTIN 300 MG PO CAPS: ORAL_CAPSULE | ORAL | 2 refills | Status: DC

## 2020-08-22 MED FILL — ESOMEPRAZOLE MAG DR 40 MG C: 40 | 30 days supply | Qty: 30 | Fill #0

## 2020-08-22 MED FILL — GABAPENTIN 300 MG CAPSULE: 300 | 18 days supply | Qty: 90 | Fill #0

## 2020-08-23 LAB — CERVICOVAGINAL ANCILLARY ONLY
Chlamydia: NEGATIVE
Comment: NEGATIVE
Comment: NEGATIVE
Comment: NORMAL
Neisseria Gonorrhea: NEGATIVE
Trichomonas: NEGATIVE

## 2020-08-24 LAB — URINE CULTURE

## 2020-08-25 ENCOUNTER — Other Ambulatory Visit: Payer: Self-pay

## 2020-08-25 ENCOUNTER — Ambulatory Visit: Payer: 59 | Attending: Family Medicine

## 2020-08-25 DIAGNOSIS — M2560 Stiffness of unspecified joint, not elsewhere classified: Secondary | ICD-10-CM | POA: Insufficient documentation

## 2020-08-25 DIAGNOSIS — M25651 Stiffness of right hip, not elsewhere classified: Secondary | ICD-10-CM | POA: Insufficient documentation

## 2020-08-25 DIAGNOSIS — M25652 Stiffness of left hip, not elsewhere classified: Secondary | ICD-10-CM | POA: Insufficient documentation

## 2020-08-25 DIAGNOSIS — M62830 Muscle spasm of back: Secondary | ICD-10-CM | POA: Insufficient documentation

## 2020-08-25 DIAGNOSIS — M5440 Lumbago with sciatica, unspecified side: Secondary | ICD-10-CM | POA: Insufficient documentation

## 2020-08-25 DIAGNOSIS — R293 Abnormal posture: Secondary | ICD-10-CM | POA: Diagnosis not present
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
## 2020-08-25 NOTE — Patient Instructions (Signed)
diaframatic breathing  With exhale 2-4x as long as inhale. 3 rep 3x/day

## 2020-08-31 ENCOUNTER — Other Ambulatory Visit: Payer: Self-pay

## 2020-08-31 ENCOUNTER — Ambulatory Visit (INDEPENDENT_AMBULATORY_CARE_PROVIDER_SITE_OTHER): Payer: 59 | Admitting: Physical Medicine and Rehabilitation

## 2020-08-31 ENCOUNTER — Ambulatory Visit: Payer: Self-pay

## 2020-08-31 ENCOUNTER — Encounter: Payer: Self-pay | Admitting: Physical Medicine and Rehabilitation

## 2020-08-31 VITALS — BP 166/87 | HR 67

## 2020-08-31 DIAGNOSIS — M5416 Radiculopathy, lumbar region: Secondary | ICD-10-CM | POA: Diagnosis not present

## 2020-08-31 MED ORDER — BETAMETHASONE SOD PHOS & ACET 6 (3-3) MG/ML IJ SUSP
12.0000 mg | Freq: Once | INTRAMUSCULAR | Status: AC
  Administered 2020-08-31: 12 mg

## 2020-08-31 NOTE — Progress Notes (Signed)
Pt state lower back pain the travels down her left butt cheeks. Pt state if she lay on she left side or sit for a long period of time it makes the pain worse. Pt states she takes pain meds to help ease the pain.  Numeric Pain Rating Scale and Functional Assessment Average Pain 7   In the last MONTH (on 0-10 scale) has pain interfered with the following?  1. General activity like being  able to carry out your everyday physical activities such as walking, climbing stairs, carrying groceries, or moving a chair?  Rating(10)   +Driver, -BT, -Dye Allergies.

## 2020-09-07 ENCOUNTER — Ambulatory Visit: Payer: 59 | Admitting: Physical Therapy

## 2020-09-09 ENCOUNTER — Ambulatory Visit: Payer: 59 | Admitting: Physical Therapy

## 2020-09-13 ENCOUNTER — Other Ambulatory Visit: Payer: Self-pay

## 2020-09-13 ENCOUNTER — Ambulatory Visit: Payer: 59 | Attending: Family Medicine

## 2020-09-13 DIAGNOSIS — M2560 Stiffness of unspecified joint, not elsewhere classified: Secondary | ICD-10-CM | POA: Insufficient documentation

## 2020-09-13 DIAGNOSIS — R293 Abnormal posture: Secondary | ICD-10-CM | POA: Diagnosis not present

## 2020-09-13 DIAGNOSIS — M25651 Stiffness of right hip, not elsewhere classified: Secondary | ICD-10-CM | POA: Insufficient documentation

## 2020-09-13 DIAGNOSIS — M5440 Lumbago with sciatica, unspecified side: Secondary | ICD-10-CM | POA: Diagnosis not present

## 2020-09-13 DIAGNOSIS — M62830 Muscle spasm of back: Secondary | ICD-10-CM | POA: Diagnosis not present

## 2020-09-13 DIAGNOSIS — M25652 Stiffness of left hip, not elsewhere classified: Secondary | ICD-10-CM | POA: Diagnosis not present
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
## 2020-09-16 ENCOUNTER — Ambulatory Visit: Payer: 59 | Admitting: Physical Therapy

## 2020-09-20 ENCOUNTER — Other Ambulatory Visit: Payer: Self-pay

## 2020-09-20 ENCOUNTER — Ambulatory Visit: Payer: 59

## 2020-09-20 DIAGNOSIS — M25651 Stiffness of right hip, not elsewhere classified: Secondary | ICD-10-CM | POA: Diagnosis not present

## 2020-09-20 DIAGNOSIS — M2560 Stiffness of unspecified joint, not elsewhere classified: Secondary | ICD-10-CM | POA: Diagnosis not present

## 2020-09-20 DIAGNOSIS — M25652 Stiffness of left hip, not elsewhere classified: Secondary | ICD-10-CM | POA: Diagnosis not present

## 2020-09-20 DIAGNOSIS — M5440 Lumbago with sciatica, unspecified side: Secondary | ICD-10-CM

## 2020-09-20 DIAGNOSIS — M62830 Muscle spasm of back: Secondary | ICD-10-CM

## 2020-09-20 DIAGNOSIS — R293 Abnormal posture: Secondary | ICD-10-CM | POA: Diagnosis not present
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
## 2020-09-27 ENCOUNTER — Ambulatory Visit: Payer: 59 | Admitting: Physical Therapy

## 2020-09-27 ENCOUNTER — Other Ambulatory Visit: Payer: Self-pay

## 2020-09-27 ENCOUNTER — Encounter: Payer: Self-pay | Admitting: Physical Therapy

## 2020-09-27 DIAGNOSIS — M2560 Stiffness of unspecified joint, not elsewhere classified: Secondary | ICD-10-CM | POA: Diagnosis not present

## 2020-09-27 DIAGNOSIS — M5440 Lumbago with sciatica, unspecified side: Secondary | ICD-10-CM

## 2020-09-27 DIAGNOSIS — M62830 Muscle spasm of back: Secondary | ICD-10-CM

## 2020-09-27 DIAGNOSIS — R293 Abnormal posture: Secondary | ICD-10-CM

## 2020-09-27 DIAGNOSIS — M25652 Stiffness of left hip, not elsewhere classified: Secondary | ICD-10-CM | POA: Diagnosis not present

## 2020-09-27 DIAGNOSIS — M25651 Stiffness of right hip, not elsewhere classified: Secondary | ICD-10-CM

## 2020-09-27 NOTE — Patient Instructions (Signed)
Access Code: [email or web address] URL: [email or web address] Date: 09/27/2020 Prepared by: Hessie Diener  Exercises Cat-Camel to Child's Pose - 1 x daily - 7 x weekly - 1 sets - 10 reps Sidelying Thoracic Rotation with Open Book - 1 x daily - 7 x weekly - 1 sets - 10 reps
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
## 2020-09-30 ENCOUNTER — Other Ambulatory Visit: Payer: Self-pay

## 2020-09-30 ENCOUNTER — Ambulatory Visit: Payer: 59 | Attending: Family Medicine | Admitting: Physical Therapy

## 2020-09-30 ENCOUNTER — Encounter: Payer: Self-pay | Admitting: Physical Therapy

## 2020-09-30 DIAGNOSIS — M5440 Lumbago with sciatica, unspecified side: Secondary | ICD-10-CM | POA: Diagnosis not present

## 2020-09-30 DIAGNOSIS — M25651 Stiffness of right hip, not elsewhere classified: Secondary | ICD-10-CM | POA: Insufficient documentation

## 2020-09-30 DIAGNOSIS — M2560 Stiffness of unspecified joint, not elsewhere classified: Secondary | ICD-10-CM | POA: Insufficient documentation

## 2020-09-30 DIAGNOSIS — M25652 Stiffness of left hip, not elsewhere classified: Secondary | ICD-10-CM | POA: Insufficient documentation

## 2020-09-30 DIAGNOSIS — R293 Abnormal posture: Secondary | ICD-10-CM | POA: Diagnosis not present

## 2020-09-30 DIAGNOSIS — M62830 Muscle spasm of back: Secondary | ICD-10-CM | POA: Diagnosis not present
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
## 2020-10-04 ENCOUNTER — Other Ambulatory Visit: Payer: Self-pay

## 2020-10-04 ENCOUNTER — Ambulatory Visit: Payer: 59

## 2020-10-04 DIAGNOSIS — R293 Abnormal posture: Secondary | ICD-10-CM | POA: Diagnosis not present

## 2020-10-04 DIAGNOSIS — M62830 Muscle spasm of back: Secondary | ICD-10-CM | POA: Diagnosis not present

## 2020-10-04 DIAGNOSIS — M25651 Stiffness of right hip, not elsewhere classified: Secondary | ICD-10-CM

## 2020-10-04 DIAGNOSIS — M5440 Lumbago with sciatica, unspecified side: Secondary | ICD-10-CM | POA: Diagnosis not present

## 2020-10-04 DIAGNOSIS — M2560 Stiffness of unspecified joint, not elsewhere classified: Secondary | ICD-10-CM | POA: Diagnosis not present

## 2020-10-04 DIAGNOSIS — M25652 Stiffness of left hip, not elsewhere classified: Secondary | ICD-10-CM

## 2020-10-04 NOTE — Patient Instructions (Signed)
PROM bilateral knee flexion . Hip rotation , hip extension 3 reps RT and LT hold 15 sec  1-2x/day but do this prior to bed.
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
## 2020-10-19 ENCOUNTER — Ambulatory Visit: Payer: 59 | Admitting: Rehabilitative and Restorative Service Providers"

## 2020-10-26 ENCOUNTER — Other Ambulatory Visit: Payer: Self-pay

## 2020-10-26 ENCOUNTER — Ambulatory Visit: Payer: 59

## 2020-10-26 DIAGNOSIS — R293 Abnormal posture: Secondary | ICD-10-CM

## 2020-10-26 DIAGNOSIS — M5440 Lumbago with sciatica, unspecified side: Secondary | ICD-10-CM

## 2020-10-26 DIAGNOSIS — M25652 Stiffness of left hip, not elsewhere classified: Secondary | ICD-10-CM

## 2020-10-26 DIAGNOSIS — M25651 Stiffness of right hip, not elsewhere classified: Secondary | ICD-10-CM

## 2020-10-26 DIAGNOSIS — M2560 Stiffness of unspecified joint, not elsewhere classified: Secondary | ICD-10-CM | POA: Diagnosis not present

## 2020-10-26 DIAGNOSIS — M62830 Muscle spasm of back: Secondary | ICD-10-CM | POA: Diagnosis not present
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
## 2020-10-30 NOTE — Procedures (Signed)
Lumbar Epidural Steroid Injection - Interlaminar Approach with Fluoroscopic Guidance  Patient: [name]      Date of Birth: Nov 18, 1965 MRN: [id number] PCP: Ronnald Ramp, MD      Visit Date: 08/31/2020   Universal Protocol:     Consent Given By: the patient  Position: PRONE  Additional Comments: Vital signs were monitored before and after the procedure. Patient was prepped and draped in the usual sterile fashion. The correct patient, procedure, and site was verified.   Injection Procedure Details:   Procedure diagnoses: Lumbar radiculopathy [M54.16]   Meds Administered:  Meds ordered this encounter  Medications  . betamethasone acetate-betamethasone sodium phosphate (CELESTONE) injection 12 mg     Laterality: Left  Location/Site:  L5-S1  Needle: 3.5 in., 20 ga. Tuohy  Needle Placement: Paramedian epidural  Findings:   -Comments: Excellent flow of contrast into the epidural space.  Procedure Details: Using a paramedian approach from the side mentioned above, the region overlying the inferior lamina was localized under fluoroscopic visualization and the soft tissues overlying this structure were infiltrated with 4 ml. of 1% Lidocaine without Epinephrine. The Tuohy needle was inserted into the epidural space using a paramedian approach.   The epidural space was localized using loss of resistance along with counter oblique bi-planar fluoroscopic views.  After negative aspirate for air, blood, and CSF, a 2 ml. volume of Isovue-250 was injected into the epidural space and the flow of contrast was observed. Radiographs were obtained for documentation purposes.    The injectate was administered into the level noted above.   Additional Comments:  The patient tolerated the procedure well Dressing: 2 x 2 sterile gauze and Band-Aid    Post-procedure details: Patient was observed during the procedure. Post-procedure instructions were reviewed.  Patient left  the clinic in stable condition.
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
## 2020-11-06 ENCOUNTER — Emergency Department (HOSPITAL_COMMUNITY)
Admission: EM | Admit: 2020-11-06 | Discharge: 2020-11-06 | Disposition: A | Discharge status: Home / Self Care | Payer: 59 | Attending: Emergency Medicine | Admitting: Emergency Medicine

## 2020-11-06 ENCOUNTER — Encounter (HOSPITAL_COMMUNITY): Payer: Self-pay

## 2020-11-06 ENCOUNTER — Other Ambulatory Visit (HOSPITAL_COMMUNITY): Payer: Self-pay | Admitting: Emergency Medicine

## 2020-11-06 DIAGNOSIS — U071 COVID-19: Secondary | ICD-10-CM | POA: Diagnosis not present

## 2020-11-06 DIAGNOSIS — J441 Chronic obstructive pulmonary disease with (acute) exacerbation: Secondary | ICD-10-CM | POA: Insufficient documentation

## 2020-11-06 DIAGNOSIS — R7303 Prediabetes: Secondary | ICD-10-CM | POA: Diagnosis not present

## 2020-11-06 DIAGNOSIS — Z7951 Long term (current) use of inhaled steroids: Secondary | ICD-10-CM | POA: Diagnosis not present

## 2020-11-06 DIAGNOSIS — Z79899 Other long term (current) drug therapy: Secondary | ICD-10-CM | POA: Diagnosis not present

## 2020-11-06 DIAGNOSIS — I10 Essential (primary) hypertension: Secondary | ICD-10-CM | POA: Insufficient documentation

## 2020-11-06 DIAGNOSIS — Z87891 Personal history of nicotine dependence: Secondary | ICD-10-CM | POA: Diagnosis not present

## 2020-11-06 DIAGNOSIS — R509 Fever, unspecified: Secondary | ICD-10-CM | POA: Diagnosis present

## 2020-11-06 DIAGNOSIS — R000 Tachycardia, unspecified: Secondary | ICD-10-CM | POA: Diagnosis not present

## 2020-11-06 MED ORDER — PREDNISONE 10 MG PO TABS: 60.0000 mg | ORAL_TABLET | Freq: Every day | ORAL | 0 refills | Status: AC

## 2020-11-06 MED ORDER — PREDNISONE 20 MG PO TABS
60.0000 mg | ORAL_TABLET | Freq: Once | ORAL | Status: AC
  Administered 2020-11-06: 60 mg via ORAL
  Filled 2020-11-06: qty 3

## 2020-11-06 MED ORDER — ONDANSETRON 4 MG PO TBDP: 4.0000 mg | ORAL_TABLET | Freq: Three times a day (TID) | ORAL | 0 refills | Status: DC | PRN
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
## 2020-11-06 NOTE — Discharge Instructions (Addendum)
Instructions.  You should take prednisone for the next 4 days.  You already received a dose for today.  Your next dose is due tomorrow morning.  This is to prevent COPD exacerbation.  You should quarantine for 10 days from the onset of your symptoms, or else follow your return to work policy.  You should give yourself albuterol nebulizer treatments every 4-6 hours as needed at home for shortness of breath.  You should buy a pulse oximeter (pulse ox) online or in a pharmacy to track your oxygen level.  If your level drops below 88% at rest, or you are having difficulty breathing, you should return to the ER.  *  You can use over the counter medications for coughing, fevers, chills, etc, including tylenol and motrin.  Never take more medicine than prescribed on the bottle.  Zofran was prescribed for nausea as needed.  Drink LOTS of water to keep hydrated.

## 2020-11-06 NOTE — ED Triage Notes (Addendum)
Pt reports she was at work when she developed chest pressure. Pt reports she feels like something is sitting on her chest on her chest.Pt reports her husband is + for COVID.Pt reports she is vaccinated.

## 2020-11-07 ENCOUNTER — Telehealth: Payer: Self-pay | Admitting: Unknown Physician Specialty

## 2020-11-07 ENCOUNTER — Other Ambulatory Visit: Payer: Self-pay | Admitting: Unknown Physician Specialty

## 2020-11-07 DIAGNOSIS — U071 COVID-19: Secondary | ICD-10-CM

## 2020-11-07 DIAGNOSIS — J4541 Moderate persistent asthma with (acute) exacerbation: Secondary | ICD-10-CM

## 2020-11-07 DIAGNOSIS — I10 Essential (primary) hypertension: Secondary | ICD-10-CM

## 2020-11-07 DIAGNOSIS — R7303 Prediabetes: Secondary | ICD-10-CM

## 2020-11-07 MED FILL — ONDANSETRON ODT 4 MG TABLET: 4 | 5 days supply | Qty: 15 | Fill #0

## 2020-11-07 NOTE — Telephone Encounter (Signed)
I connected by phone with [name] on 11/07/2020 at 5:06 PM to discuss the potential use of a new treatment for mild to moderate COVID-19 viral infection in non-hospitalized patients.  This patient is a 55 y.o. female that meets the FDA criteria for Emergency Use Authorization of COVID monoclonal antibody sotrovimab.  Has a (+) direct SARS-CoV-2 viral test result  Has mild or moderate COVID-19   Is NOT hospitalized due to COVID-19  Is within 10 days of symptom onset  Has at least one of the high risk factor(s) for progression to severe COVID-19 and/or hospitalization as defined in EUA.  Specific high risk criteria : Cardiovascular disease or hypertension and Chronic Lung Disease   I have spoken and communicated the following to the patient or parent/caregiver regarding COVID monoclonal antibody treatment:  1. FDA has authorized the emergency use for the treatment of mild to moderate COVID-19 in adults and pediatric patients with positive results of direct SARS-CoV-2 viral testing who are 4 years of age and older weighing at least 40 kg, and who are at high risk for progressing to severe COVID-19 and/or hospitalization.  2. The significant known and potential risks and benefits of COVID monoclonal antibody, and the extent to which such potential risks and benefits are unknown.  3. Information on available alternative treatments and the risks and benefits of those alternatives, including clinical trials.  4. Patients treated with COVID monoclonal antibody should continue to self-isolate and use infection control measures (e.g., wear mask, isolate, social distance, avoid sharing personal items, clean and disinfect "high touch" surfaces, and frequent handwashing) according to CDC guidelines.   5. The patient or parent/caregiver has the option to accept or refuse COVID monoclonal antibody treatment.  After reviewing this information with the patient, the patient has agreed to receive one  of the available covid 19 monoclonal antibodies and will be provided an appropriate fact sheet prior to infusion. [name], NP 11/07/2020 5:06 PM  Sx onset 1/08 - boosted but over 6 months ago.  No oral product left

## 2020-11-07 NOTE — Telephone Encounter (Signed)
Called to discuss with patient about COVID-19 symptoms and the use of one of the available treatments for those with mild to moderate Covid symptoms and at a high risk of hospitalization.  Pt appears to qualify for outpatient treatment due to co-morbid conditions and/or a member of an at-risk group in accordance with the FDA Emergency Use Authorization.    Symptom onset: 1/09 Vaccinated: yes  Booster? ? Qualifiers: asthma, weight  Unable to reach pt - Unable to leave message  [name]

## 2020-11-08 ENCOUNTER — Ambulatory Visit (HOSPITAL_COMMUNITY)
Admission: RE | Admit: 2020-11-08 | Discharge: 2020-11-08 | Disposition: A | Discharge status: Home / Self Care | Payer: 59 | Source: Ambulatory Visit | Attending: Pulmonary Disease | Admitting: Pulmonary Disease

## 2020-11-08 DIAGNOSIS — R7303 Prediabetes: Secondary | ICD-10-CM | POA: Diagnosis not present

## 2020-11-08 DIAGNOSIS — J4541 Moderate persistent asthma with (acute) exacerbation: Secondary | ICD-10-CM | POA: Diagnosis not present

## 2020-11-08 DIAGNOSIS — I10 Essential (primary) hypertension: Secondary | ICD-10-CM | POA: Diagnosis not present

## 2020-11-08 DIAGNOSIS — U071 COVID-19: Secondary | ICD-10-CM | POA: Diagnosis not present

## 2020-11-08 MED ORDER — SOTROVIMAB 500 MG/8ML IV SOLN
500.0000 mg | Freq: Once | INTRAVENOUS | Status: AC
  Administered 2020-11-08: 500 mg via INTRAVENOUS

## 2020-11-08 MED ORDER — SODIUM CHLORIDE 0.9 % IV SOLN: INTRAVENOUS | Status: DC | PRN

## 2020-11-08 MED ORDER — ALBUTEROL SULFATE HFA 108 (90 BASE) MCG/ACT IN AERS: 2.0000 | INHALATION_SPRAY | Freq: Once | RESPIRATORY_TRACT | Status: DC | PRN

## 2020-11-08 MED ORDER — FAMOTIDINE IN NACL 20-0.9 MG/50ML-% IV SOLN: 20.0000 mg | Freq: Once | INTRAVENOUS | Status: DC | PRN

## 2020-11-08 MED ORDER — DIPHENHYDRAMINE HCL 50 MG/ML IJ SOLN: 50.0000 mg | Freq: Once | INTRAMUSCULAR | Status: DC | PRN

## 2020-11-08 MED ORDER — METHYLPREDNISOLONE SODIUM SUCC 125 MG IJ SOLR: 125.0000 mg | Freq: Once | INTRAMUSCULAR | Status: DC | PRN

## 2020-11-08 MED ORDER — EPINEPHRINE 0.3 MG/0.3ML IJ SOAJ: 0.3000 mg | Freq: Once | INTRAMUSCULAR | Status: DC | PRN

## 2020-11-08 NOTE — Progress Notes (Signed)
Diagnosis: COVID-19  Physician: Dr. Patrick Wright  Procedure: Covid Infusion Clinic Med: Sotrovimab infusion - Provided patient with sotrovimab fact sheet for patients, parents, and caregivers prior to infusion.   Complications: No immediate complications noted  Discharge: Discharged home    

## 2020-11-08 NOTE — Progress Notes (Signed)
Patient reviewed Fact Sheet for Patients, Parents, and Caregivers for Emergency Use Authorization (EUA) of sotrovimab for the Treatment of Coronavirus. Patient also reviewed and is agreeable to the estimated cost of treatment. Patient is agreeable to proceed.   

## 2020-11-08 NOTE — Discharge Instructions (Signed)
10 Things You Can Do to Manage Your COVID-19 Symptoms at Home If you have possible or confirmed COVID-19: 1. Stay home except to get medical care. 2. Monitor your symptoms carefully. If your symptoms get worse, call your healthcare provider immediately. 3. Get rest and stay hydrated. 4. If you have a medical appointment, call the healthcare provider ahead of time and tell them that you have or may have COVID-19. 5. For medical emergencies, call 911 and notify the dispatch personnel that you have or may have COVID-19. 6. Cover your cough and sneezes with a tissue or use the inside of your elbow. 7. Wash your hands often with soap and water for at least 20 seconds or clean your hands with an alcohol-based hand sanitizer that contains at least 60% alcohol. 8. As much as possible, stay in a specific room and away from other people in your home. Also, you should use a separate bathroom, if available. If you need to be around other people in or outside of the home, wear a mask. 9. Avoid sharing personal items with other people in your household, like dishes, towels, and bedding. 10. Clean all surfaces that are touched often, like counters, tabletops, and doorknobs. Use household cleaning sprays or wipes according to the label instructions. [email or web address] 05/13/2020 This information is not intended to replace advice given to you by your health care provider. Make sure you discuss any questions you have with your health care provider. Document Revised: 08/29/2020 Document Reviewed: 08/29/2020 Elsevier Patient Education  2021 [place]. What types of side effects do monoclonal antibody drugs cause?  Common side effects  In general, the more common side effects caused by monoclonal antibody drugs include: . Allergic reactions, such as hives or itching . Flu-like signs and symptoms, including chills, fatigue, fever, and muscle aches and pains . Nausea, vomiting . Diarrhea . Skin rashes . Low  blood pressure   The CDC is recommending patients who receive monoclonal antibody treatments wait at least 90 days before being vaccinated.  Currently, there are no data on the safety and efficacy of mRNA COVID-19 vaccines in persons who received monoclonal antibodies or convalescent plasma as part of COVID-19 treatment. Based on the estimated half-life of such therapies as well as evidence suggesting that reinfection is uncommon in the 90 days after initial infection, vaccination should be deferred for at least 90 days, as a precautionary measure until additional information becomes available, to avoid interference of the antibody treatment with vaccine-induced immune responses. If you have any questions or concerns after the infusion please call the Advanced Practice Provider on call at [phone]. This number is ONLY intended for your use regarding questions or concerns about the infusion post-treatment side-effects.  Please do not provide this number to others for use. For return to work notes please contact your primary care provider.

## 2020-11-09 ENCOUNTER — Ambulatory Visit: Payer: 59

## 2020-11-11 ENCOUNTER — Ambulatory Visit: Payer: 59 | Admitting: Physical Therapy

## 2020-11-14 ENCOUNTER — Ambulatory Visit: Payer: 59 | Admitting: Family Medicine

## 2020-11-15 DIAGNOSIS — G4733 Obstructive sleep apnea (adult) (pediatric): Secondary | ICD-10-CM | POA: Diagnosis not present

## 2020-11-15 DIAGNOSIS — J45901 Unspecified asthma with (acute) exacerbation: Secondary | ICD-10-CM | POA: Diagnosis not present

## 2020-11-18 ENCOUNTER — Ambulatory Visit: Payer: 59 | Admitting: Physical Therapy

## 2020-11-20 NOTE — Progress Notes (Signed)
    SUBJECTIVE:   CHIEF COMPLAINT / HPI: follow up for COVID diagnosis   Prior COVID-19 Infection Patient was positive for COVID on 11/06/20 and received an MAB infusion. She reports that she continued to have symptoms after 5-day quarantine.. She reports that she is had to leave work and has been absent since 1/18. Patient reports that she continued to feel feverish and had sweating during work. Patient states the since leaving work earlier this week, she has resolved symptoms. She reports feeling a little bit better. She is requesting to have short-term leave paperwork completed as she continues to have some shortness of breath when having to walk for distances longer than 3 to 5 feet. Patient is also noted to have history of asthma. 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 Has had 3 Covid vaccination doses.  Foot neuropathy Patient reports that since her diagnosis of Covid, she has been experiencing tingling and paresthesias in bilateral feet. She reports it feels as if she is "stepping on needles" and it occurs randomly throughout the day. Patient reports that she is previously on gabapentin but has not been able to get refills since. She is requesting to have this medication refilled.   PERTINENT  PMH / PSH:  Asthma COVID-19 infection  OBJECTIVE:   BP (!) 154/80   Pulse 99   Wt 207 lb 9.6 oz (94.2 kg)   LMP 05/21/2017 (Exact Date)   SpO2 97%   BMI 39.23 kg/m   General: female appearing stated age in no acute distress HEENT: MMM, no oral lesions noted,Neck non-tender without lymphadenopathy Cardio: Normal S1 and S2, no S3 or S4. Rhythm is regular. No murmurs or rubs.  Bilateral radial pulses palpable Pulm: Clear to auscultation bilaterally, no crackles, wheezing, or diminished breath sounds. Normal respiratory effort, stable on RA Extremities: No peripheral edema. Warm/ well perfused.   ASSESSMENT/PLAN:   Clinical diagnosis of COVID-19 Patient is  actively recovering from Covid 19 diagnosis. She reports that symptoms of shortness of breath and fatigue are improved. Patient received Mab infusion during acute illness. She has continued to be absent to work due to her prolonged symptoms and fevers. Her pulmonary exam is unremarkable today with oxygen saturations of 97%. Patient is status post 3 doses of Covid vaccination. -Completed FMLA paperwork for employer, faxed on 11/22/2020 -Patient given work note to return on 11/24/2020 -Patient counseled to follow-up if symptoms recur or worsen  Type 2 diabetes mellitus with diabetic neuropathy, unspecified ([place]) Patient's neuropathy symptoms likely related to type 2 diabetes. Could also consider this is secondary complication of [id number]-[id number]. -Treat w gabapentin 300 mg 3 times daily    Eulis Foster, MD [place]

## 2020-11-21 ENCOUNTER — Other Ambulatory Visit: Payer: Self-pay | Admitting: Family Medicine

## 2020-11-21 ENCOUNTER — Ambulatory Visit: Payer: 59 | Admitting: Family Medicine

## 2020-11-21 ENCOUNTER — Encounter: Payer: Self-pay | Admitting: Family Medicine

## 2020-11-21 ENCOUNTER — Other Ambulatory Visit: Payer: Self-pay

## 2020-11-21 VITALS — BP 154/80 | HR 99 | Wt 207.6 lb

## 2020-11-21 DIAGNOSIS — R102 Pelvic and perineal pain: Secondary | ICD-10-CM | POA: Diagnosis not present

## 2020-11-21 DIAGNOSIS — E1140 Type 2 diabetes mellitus with diabetic neuropathy, unspecified: Secondary | ICD-10-CM | POA: Diagnosis not present

## 2020-11-21 DIAGNOSIS — U071 COVID-19: Secondary | ICD-10-CM

## 2020-11-21 MED ORDER — NAPROXEN 500 MG PO TABS: ORAL_TABLET | ORAL | 1 refills | Status: DC

## 2020-11-21 MED ORDER — CETIRIZINE HCL 10 MG PO TABS: 10.0000 mg | ORAL_TABLET | Freq: Every day | ORAL | 11 refills | Status: DC

## 2020-11-21 MED ORDER — ESOMEPRAZOLE MAGNESIUM 40 MG PO CPDR
40.0000 mg | DELAYED_RELEASE_CAPSULE | Freq: Every day | ORAL | 0 refills | Status: DC
Start: 2020-11-21 — End: 2020-11-21

## 2020-11-21 MED ORDER — GABAPENTIN 300 MG PO CAPS: ORAL_CAPSULE | ORAL | 2 refills | Status: DC

## 2020-11-21 MED ORDER — ESCITALOPRAM OXALATE 20 MG PO TABS: 20.0000 mg | ORAL_TABLET | Freq: Every day | ORAL | 2 refills | Status: DC

## 2020-11-21 MED ORDER — OXYBUTYNIN CHLORIDE ER 10 MG PO TB24: 10.0000 mg | ORAL_TABLET | Freq: Every day | ORAL | 2 refills | Status: DC

## 2020-11-21 MED ORDER — FLUTICASONE PROPIONATE 50 MCG/ACT NA SUSP: NASAL | 6 refills | Status: DC

## 2020-11-21 MED ORDER — ONDANSETRON 4 MG PO TBDP: 4.0000 mg | ORAL_TABLET | Freq: Three times a day (TID) | ORAL | 0 refills | Status: DC | PRN

## 2020-11-21 MED ORDER — LOSARTAN POTASSIUM 50 MG PO TABS: 50.0000 mg | ORAL_TABLET | Freq: Every day | ORAL | 1 refills | Status: DC

## 2020-11-21 MED FILL — ONDANSETRON ODT 4 MG TABLET: 4 | 5 days supply | Qty: 15 | Fill #0

## 2020-11-21 MED FILL — FLUTICASONE PROP 50 MCG SPR: 50 | 30 days supply | Qty: 16 | Fill #0

## 2020-11-21 MED FILL — LOSARTAN POTASSIUM 50 MG TA: 50 | 30 days supply | Qty: 30 | Fill #0

## 2020-11-21 MED FILL — OXYBUTYNIN CL ER 10 MG TAB: 10 | 30 days supply | Qty: 30 | Fill #0

## 2020-11-21 MED FILL — ESCITALOPRAM 20 MG TABLET: 20 | 30 days supply | Qty: 30 | Fill #0

## 2020-11-21 MED FILL — NAPROXEN 500 MG TABLET: 500 | 45 days supply | Qty: 90 | Fill #0

## 2020-11-21 MED FILL — GABAPENTIN 300 MG CAPSULE: 300 | 30 days supply | Qty: 90 | Fill #0

## 2020-11-21 MED FILL — ESOMEPRAZOLE MAG DR 40 MG C: 40 | 30 days supply | Qty: 30 | Fill #0

## 2020-11-21 NOTE — Patient Instructions (Signed)
I am happy to hear that you are feeling better and recovering from your illness. I have completed a work note for you to return on the morning of 11/24/2018. I will refill gabapentin to help with your feet symptoms.

## 2020-11-22 MED FILL — AJOVY 225 MG/1.5ML SOSY: 225 | 30 days supply | Qty: 2 | Fill #2

## 2020-11-22 MED FILL — ALBUTEROL SULFATE HFA 108 (: 108 (90 BAS | 12 days supply | Qty: 9 | Fill #0

## 2020-11-23 ENCOUNTER — Other Ambulatory Visit: Payer: Self-pay | Admitting: Pulmonary Disease

## 2020-11-23 DIAGNOSIS — U071 COVID-19: Secondary | ICD-10-CM | POA: Insufficient documentation

## 2020-11-23 DIAGNOSIS — E1140 Type 2 diabetes mellitus with diabetic neuropathy, unspecified: Secondary | ICD-10-CM | POA: Insufficient documentation

## 2020-11-23 MED FILL — TRELEGY ELLIPTA 200-62.5-25: 200-62.5-25 | 30 days supply | Qty: 60 | Fill #0

## 2020-11-23 NOTE — Assessment & Plan Note (Signed)
Patient is actively recovering from Covid 19 diagnosis. She reports that symptoms of shortness of breath and fatigue are improved. Patient received Mab infusion during acute illness. She has continued to be absent to work due to her prolonged symptoms and fevers. Her pulmonary exam is unremarkable today with oxygen saturations of 97%. Patient is status post 3 doses of Covid vaccination. -Completed FMLA paperwork for employer, faxed on 11/22/2020 -Patient given work note to return on 11/24/2020 -Patient counseled to follow-up if symptoms recur or worsen

## 2020-11-23 NOTE — Assessment & Plan Note (Signed)
Patient's neuropathy symptoms likely related to type 2 diabetes. Could also consider this is secondary complication of [id number]. -Treat w gabapentin 300 mg 3 times daily

## 2020-11-25 ENCOUNTER — Other Ambulatory Visit: Payer: Self-pay

## 2020-11-25 ENCOUNTER — Ambulatory Visit: Payer: 59 | Attending: Family Medicine | Admitting: Physical Therapy

## 2020-11-25 ENCOUNTER — Encounter: Payer: Self-pay | Admitting: Physical Therapy

## 2020-11-25 DIAGNOSIS — M62830 Muscle spasm of back: Secondary | ICD-10-CM | POA: Diagnosis not present

## 2020-11-25 DIAGNOSIS — M25652 Stiffness of left hip, not elsewhere classified: Secondary | ICD-10-CM

## 2020-11-25 DIAGNOSIS — M25651 Stiffness of right hip, not elsewhere classified: Secondary | ICD-10-CM | POA: Diagnosis not present

## 2020-11-25 DIAGNOSIS — R293 Abnormal posture: Secondary | ICD-10-CM | POA: Diagnosis not present

## 2020-11-25 DIAGNOSIS — M2560 Stiffness of unspecified joint, not elsewhere classified: Secondary | ICD-10-CM | POA: Diagnosis not present

## 2020-11-25 DIAGNOSIS — M5440 Lumbago with sciatica, unspecified side: Secondary | ICD-10-CM
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
## 2020-12-02 DIAGNOSIS — H524 Presbyopia: Secondary | ICD-10-CM | POA: Diagnosis not present

## 2020-12-14 ENCOUNTER — Ambulatory Visit
Admission: RE | Admit: 2020-12-14 | Discharge: 2020-12-14 | Disposition: A | Discharge status: Home / Self Care | Payer: 59 | Attending: Pulmonary Disease | Admitting: Pulmonary Disease

## 2020-12-14 ENCOUNTER — Ambulatory Visit: Payer: 59 | Admitting: Pulmonary Disease

## 2020-12-14 ENCOUNTER — Encounter: Payer: Self-pay | Admitting: Pulmonary Disease

## 2020-12-14 ENCOUNTER — Other Ambulatory Visit: Payer: Self-pay

## 2020-12-14 ENCOUNTER — Ambulatory Visit
Admission: RE | Admit: 2020-12-14 | Discharge: 2020-12-14 | Disposition: A | Discharge status: Home / Self Care | Payer: 59 | Source: Ambulatory Visit | Attending: Pulmonary Disease | Admitting: Pulmonary Disease

## 2020-12-14 VITALS — BP 124/72 | HR 65 | Temp 97.3°F | Ht 61.0 in | Wt 208.8 lb

## 2020-12-14 DIAGNOSIS — Z9989 Dependence on other enabling machines and devices: Secondary | ICD-10-CM | POA: Diagnosis not present

## 2020-12-14 DIAGNOSIS — R0602 Shortness of breath: Secondary | ICD-10-CM | POA: Diagnosis not present

## 2020-12-14 DIAGNOSIS — G4733 Obstructive sleep apnea (adult) (pediatric): Secondary | ICD-10-CM

## 2020-12-14 DIAGNOSIS — J4540 Moderate persistent asthma, uncomplicated: Secondary | ICD-10-CM

## 2020-12-14 DIAGNOSIS — R079 Chest pain, unspecified: Secondary | ICD-10-CM | POA: Diagnosis not present

## 2020-12-14 DIAGNOSIS — Z8616 Personal history of COVID-19: Secondary | ICD-10-CM | POA: Diagnosis not present

## 2020-12-14 NOTE — Patient Instructions (Signed)
We are going to get a chest x-ray today going to schedule you for an echocardiogram.  We will see you in follow-up in 4 to 6 weeks.   Have provided you a form to get a temporary handicap parking permit.
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
## 2020-12-15 ENCOUNTER — Other Ambulatory Visit: Payer: Self-pay | Admitting: Pulmonary Disease

## 2020-12-30 ENCOUNTER — Other Ambulatory Visit: Payer: Self-pay

## 2020-12-30 ENCOUNTER — Ambulatory Visit (INDEPENDENT_AMBULATORY_CARE_PROVIDER_SITE_OTHER): Payer: 59

## 2020-12-30 DIAGNOSIS — R0602 Shortness of breath: Secondary | ICD-10-CM

## 2020-12-30 LAB — ECHOCARDIOGRAM COMPLETE
AR max vel: 2.18 cm2
AV Area VTI: 2.08 cm2
AV Area mean vel: 1.97 cm2
AV Mean grad: 3 mmHg
AV Peak grad: 5.5 mmHg
Ao pk vel: 1.17 m/s
Area-P 1/2: 2.74 cm2
Calc EF: 54.1 %
S' Lateral: 3 cm
Single Plane A2C EF: 48.5 %
Single Plane A4C EF: 58.8 %

## 2021-01-03 ENCOUNTER — Telehealth: Payer: Self-pay | Admitting: Family Medicine

## 2021-01-03 NOTE — Telephone Encounter (Signed)
Patient dropped off FMLA forms to be completed by doctor. Patient would like a copy made and put up front for her to pick up she wants a call when ready. It is going to be faxed to [employer] when completed [phone]. Patient completed ROI. Last DOS: 11/21/2020. Placing forms in the Red team folder. Thanks!

## 2021-01-04 NOTE — Telephone Encounter (Signed)
Reviewed forms and placed in PCP's box for completion.   .Armella Stogner R Nyrie Sigal, CMA  

## 2021-01-06 ENCOUNTER — Telehealth: Payer: Self-pay | Admitting: Family Medicine

## 2021-01-06 NOTE — Telephone Encounter (Signed)
FMLA paperwork completed for migraines in asthma.  Placed in file to be faxed.  Copy made and placed in front office folder.  Please call patient and notify the papers are ready to be picked up.

## 2021-01-06 NOTE — Telephone Encounter (Signed)
Contacted patient via telephone in order to discuss request for completion of FMLA paperwork.  Patient states that FMLA paperwork is due as she has frequent exacerbations of migraine headaches and asthma exacerbations.  Patient mentioned that she was having chest pains at the time of this call.  Informed patient day if chest pain becomes more severe or she is unable to breathe, that she should be evaluated in the ED.  Patient states that she is currently resting in will be evaluated if her symptoms become worse.  Offered to schedule patient an appointment.  Patient scheduled for 4/12 at 3:10 PM as she requests only see PCP. Patient requesting paperwork be faxed to the number provided on the sheet.  Informed patient that I would complete this and fax to the requested number ([employer]).   Eulis Foster, MD [place], PGY-2 [phone]

## 2021-01-09 NOTE — Telephone Encounter (Signed)
Forms faxed to [employer] and a copy was made for batch scanning. I made a copy for the patient as well and placed up front for pick up. The patient has been made aware.

## 2021-01-24 ENCOUNTER — Other Ambulatory Visit: Payer: Self-pay

## 2021-01-24 ENCOUNTER — Other Ambulatory Visit: Payer: Self-pay | Admitting: Pulmonary Disease

## 2021-01-24 ENCOUNTER — Encounter: Payer: Self-pay | Admitting: Pulmonary Disease

## 2021-01-24 ENCOUNTER — Ambulatory Visit: Payer: 59 | Admitting: Pulmonary Disease

## 2021-01-24 VITALS — BP 130/90 | HR 91 | Temp 97.5°F | Ht 61.0 in | Wt 205.8 lb

## 2021-01-24 DIAGNOSIS — J4540 Moderate persistent asthma, uncomplicated: Secondary | ICD-10-CM

## 2021-01-24 DIAGNOSIS — R053 Chronic cough: Secondary | ICD-10-CM | POA: Diagnosis not present

## 2021-01-24 DIAGNOSIS — G4733 Obstructive sleep apnea (adult) (pediatric): Secondary | ICD-10-CM | POA: Diagnosis not present

## 2021-01-24 DIAGNOSIS — R059 Cough, unspecified: Secondary | ICD-10-CM

## 2021-01-24 DIAGNOSIS — Z9989 Dependence on other enabling machines and devices: Secondary | ICD-10-CM | POA: Diagnosis not present

## 2021-01-24 DIAGNOSIS — R0602 Shortness of breath: Secondary | ICD-10-CM

## 2021-01-24 DIAGNOSIS — K219 Gastro-esophageal reflux disease without esophagitis: Secondary | ICD-10-CM

## 2021-01-24 MED ORDER — CETIRIZINE HCL 10 MG PO TABS: 10.0000 mg | ORAL_TABLET | Freq: Every day | ORAL | 11 refills | Status: DC

## 2021-01-24 MED ORDER — FLUTICASONE PROPIONATE 50 MCG/ACT NA SUSP: NASAL | 6 refills | Status: DC

## 2021-01-24 MED FILL — FLUTICASONE PROP 50 MCG SPR: 50 | 30 days supply | Qty: 16 | Fill #0

## 2021-01-24 NOTE — Patient Instructions (Signed)
We have sent in the prescriptions for your Zyrtec and your Flonase.  Continue using your CPAP at night.  We will see you in follow-up in 3 months time call sooner should any new problems arise.
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
## 2021-01-28 ENCOUNTER — Other Ambulatory Visit (HOSPITAL_COMMUNITY): Payer: Self-pay

## 2021-01-30 NOTE — Progress Notes (Signed)
[facility]    Provider:  Dr Jaynee Eagles Referring Provider: Donnal Moat* Primary Care Physician:  Eulis Foster, MD  CC: Migraines  Interval history 02/01/2021:  She was married in November 2021. She has 5 sons. We looked at wedding photos, she had covid in January, she is still recovering, she follows with pulmonology, she is using her cpap. She is compliant with her cpap. She hasn't had a headache since her allergies. She is doing great.   05/21/19 [name] is a 55 y.o. female here today for follow up of migraines.  Her last visit was about a month ago when she reported having some difficulty at the pharmacy with her medications.  Her dose of Ajovy was given the last week in May.  She has not had a dose of Ajovy since.  She reports trying Relpax that did offer minimal relief.  She continues to have consistent migraines.  She reports at least 4-5 migraines per week.  She was seen by her pulmonologist about a month ago who reported poor compliance with CPAP therapy.  She states that she is usually consistent in use but over the past 6 weeks or so she has not used her CPAP.  She states that frequent sinus infections and headaches prevent her from using CPAP.  She is treated with Zyrtec daily for seasonal allergies.  She has a tension type headache nearly every day.  She reports migraines have recently been behind her right eye.  She gets very fatigued, nauseated, dizzy and occasionally has some numbness in her fingertips with migraine.  She reports tension in her neck and the base of her skull with headaches.  She does occasionally have blurred vision.  She has recently had an eye exam that was reportedly normal.  She does wear glasses.  HISTORY: (copied from my note on 04/01/2019)  [name] a 55 y.o.femalehere today for follow up of migraines.She continues Ajovy for prevention and Imitrex for acute management. She is also prescribed gabapentin  forpain andhot flashesas well as naproxen for pain.She reports that she was late gettingthis month'sdose of Ajovydue topharmacy delay.She has noted an increased frequency of her headaches thispast week. She has a migraine today that is been present for the past 3 days. She has not taking any abortive therapy due to not having any. She reports that this is her typical migraine. She denies any new finding on.  History (copied from Dr Cathren Laine note on 03/19/2018)  Interval history 03/19/2018: The Ajovy has helped with the headaches she likes it. She has had a great response. 2 migraines in April lasted 45 minutes. Most of the month she had no headaches she had some but not severe. Feeling great.Will continue Ajovy. Discussed botox and other treatments  Interval History 09/10/2017: At last appointment ordered MRIs of the brain and orbits, they were not completed. Also ordered physical therapy but finished only 2/13 visits. Had surgery 08/15/2017. hysterectomy, Bilateral salpingectomy, left oopherectomy. She can't afford MRI or PT. Started Aimovig, but she could not get it. Will try Ajovy, will give a copay ard.    HPI:  [name] is a 55 y.o. female here as a referral from Dr. Tammi Klippel for migraines. Past medical history of migraines, high blood pressure, anxiety, depression, asthma.  Headaches wake her up in the morning. Migraines since the age of 75. Worse this year, she was taking care of her grandmother and passed away and there has been stress and a bad relationship.  She has had migraine for days after getting a flu shot. She was a patient of the [facility]. They start at the back of there head and spread all ove rthe head, she has tenderness of the skin, She has a prodrome feels the pain in the neck. For the last year. She has light and sound sensitivity, it is pulsating and throbbing and pounding with nausea and vomiting and also has vision changes and episodic vision  loss unknown eye. Migraines can last weeks, has had one for weeks now, she was in the ED last Saturday, she is missing work. Sleeping in a dark room helps. Stress worsens it. Woke up with a headache again this morning and she has neck pain and neck tightness. Reports daily headaches and at least 15 are migrainous for the last 9 months. She has allergies as well. No medication overuse. No aura. No other focal neurologic deficits, associated symptoms, inciting events or modifiable factors. Lots of stress.   Medications tried include: Baclofen, Imitrex, Lexapro, gabapentin, losartan, naproxen, Zofran, propranolol, tramadol. She tried amitriptyline and has an allergy to it, zomig  Reviewed notes, labs and imaging from outside physicians, which showed:  presented the emergency room for evaluation of intermittent, generalized headache for the past 4 days on 07/20/2017. Patient had associated dizziness and feeling off balance with nausea. The headache began in the back of the head and radiated to the front. Similar to previous migraines. She tried ibuprofen. The past she received several Botox injections with provided her with 1-2 years relief in her migraines. Denied any numbness, fevers, chills, headache injury, falls, blood thinner use. She was given Compazine, Benadryl and IV fluids in the emergency room. Symptoms reviewed and she was discharged.   Review of Systems: Patient complains of symptoms per HPI as well as the following symptoms: headache . Pertinent negatives and positives per HPI. All others negative    Social History   Socioeconomic History  . Marital status: Married    Spouse name: Not on file  . Number of children: 5  . Years of education: Not on file  . Highest education level: High school graduate  Occupational History  . Not on file  Tobacco Use  . Smoking status: Former Smoker    Packs/day: 0.25    Years: 15.00    Pack years: 3.75    Types: Cigarettes    Quit date:  02/16/2003    Years since quitting: 17.9  . Smokeless tobacco: Never Used  Vaping Use  . Vaping Use: Never used  Substance and Sexual Activity  . Alcohol use: Yes    Alcohol/week: 3.0 standard drinks    Types: 3 Glasses of wine per week  . Drug use: No  . Sexual activity: Not Currently    Partners: Male    Birth control/protection: Surgical  Other Topics Concern  . Not on file  Social History Narrative   Lives at home with husband    Right handed   Caffeine: none         ** Merged History Encounter **       Social Determinants of Health   Financial Resource Strain: Not on file  Food Insecurity: Not on file  Transportation Needs: Not on file  Physical Activity: Not on file  Stress: Not on file  Social Connections: Not on file  Intimate Partner Violence: Not on file    Family History  Problem Relation Age of Onset  . Diabetes Maternal Grandmother   .  Colon cancer Maternal Grandfather   . Anesthesia problems Neg Hx   . Headache Neg Hx   . Aneurysm Neg Hx   . Esophageal cancer Neg Hx   . Rectal cancer Neg Hx   . Stomach cancer Neg Hx   . Migraines Neg Hx     Past Medical History:  Diagnosis Date  . Allergy   . Anemia   . Anxiety   . Arthritis    all over  . Asthma   . COPD (chronic obstructive pulmonary disease) ([place])   . Depression   . Dysrhythmia    Irregular heat w/ asthma episodes and anxiety  . GERD (gastroesophageal reflux disease)   . Hyperlipidemia   . Hypertension   . Migraines   . PONV (postoperative nausea and vomiting)   . Pre-diabetes   . Sleep apnea    uses CPAP  . Urinary incontinence    frequent urination- tx w/ vesicare  . Urinary urgency     Past Surgical History:  Procedure Laterality Date  . ABDOMINAL HYSTERECTOMY Bilateral 08/15/2017   Procedure: SUPRACERVICAL HYSTERECTOMY ABDOMINAL W/ BILATERAL SALPINGECTOMY AND LEFT OOPHORECTOMY;  Surgeon: Osborne Oman, MD;  Location: [facility];  Service: Gynecology;  Laterality:  Bilateral;  . ABLATION    . CESAREAN SECTION     x3  . DILATION AND CURETTAGE OF UTERUS  05/31/2005   MAB, TAB x 2  . HERNIA REPAIR  1990's  . HYSTEROSCOPY W/ ENDOMETRIAL ABLATION    . LEEP    . MAB     x 4  . MOUTH SURGERY     teeth extraction  . TUBAL LIGATION  11/01/2006  . WISDOM TOOTH EXTRACTION      Current Outpatient Medications  Medication Sig Dispense Refill  . albuterol (PROVENTIL) (2.5 MG/3ML) 0.083% nebulizer solution Take 3 mLs (2.5 mg total) by nebulization every 4 (four) hours as needed. 75 mL 6  . albuterol (VENTOLIN HFA) 108 (90 Base) MCG/ACT inhaler INHALE 2 PUFFS BY MOUTH EVERY 3 TO 4 HOURS AS NEEDED FOR SHORTNESS OF BREATH/WHEEZING/RECURRENT COUGH 8.5 g 2  . azelastine (ASTELIN) 0.1 % nasal spray Place 1 spray into both nostrils 2 (two) times daily. Use in each nostril as directed 30 mL 12  . benzonatate (TESSALON) 100 MG capsule Take 1 capsule (100 mg total) by mouth 2 (two) times daily as needed for cough. 20 capsule 0  . BIOTIN PO Take 2,000 mg by mouth daily.    . cephALEXin (KEFLEX) 500 MG capsule TAKE 1 CAPSULE BY MOUTH 4 TIMES DAILY FOR 5 DAYS. 20 capsule 0  . cetirizine (ZYRTEC) 10 MG tablet TAKE 1 TABLET (10 MG TOTAL) BY MOUTH DAILY. 30 tablet 11  . Elastic Bandages & Supports (SHOULDER BRACE LARGE) MISC 1 each by Does not apply route as needed. 1 each 0  . escitalopram (LEXAPRO) 20 MG tablet TAKE 1 TABLET (20 MG TOTAL) BY MOUTH AT BEDTIME. 30 tablet 2  . esomeprazole (NEXIUM) 40 MG capsule TAKE 1 CAPSULE (40 MG TOTAL) BY MOUTH DAILY. 90 capsule 1  . fluticasone (FLONASE) 50 MCG/ACT nasal spray PLACE 2 SPRAYS INTO THE NOSE DAILY. 16 g 6  . Fluticasone-Umeclidin-Vilant 200-62.5-25 MCG/INH AEPB INHALE 1 PUFF INTO THE LUNGS DAILY. 60 each 11  . Fremanezumab-vfrm (AJOVY) 225 MG/1.5ML SOAJ Inject 225 mg into the skin every 30 (thirty) days. 1.5 mL 11  . Fremanezumab-vfrm (AJOVY) 225 MG/1.5ML SOAJ Inject 225 mg into the skin every 30 (thirty) days. 4.5 mL 3  .  furosemide (LASIX) 20 MG tablet Take 1 tablet (20 mg total) by mouth daily. 30 tablet 0  . gabapentin (NEURONTIN) 300 MG capsule TAKE 1 CAPSULE (300 MG TOTAL) BY MOUTH EVERY 8 (EIGHT) HOURS AS NEEDED FOR PAIN. 90 capsule 2  . losartan (COZAAR) 50 MG tablet TAKE 1 TABLET (50 MG TOTAL) BY MOUTH DAILY. 90 tablet 1  . Multiple Vitamin (MULTIVITAMIN WITH MINERALS) TABS tablet Take 1 tablet by mouth daily.    . naproxen (NAPROSYN) 500 MG tablet TAKE 1 TABLET (500 MG TOTAL) BY MOUTH 2 (TWO) TIMES DAILY AS NEEDED FOR MILD PAIN OR MODERATE PAIN. 90 tablet 1  . ondansetron (ZOFRAN-ODT) 4 MG disintegrating tablet TAKE 1 TABLET (4 MG TOTAL) BY MOUTH EVERY 8 (EIGHT) HOURS AS NEEDED FOR UP TO 15 DOSES FOR NAUSEA OR VOMITING. 15 tablet 0  . oxybutynin (DITROPAN-XL) 10 MG 24 hr tablet TAKE 1 TABLET (10 MG TOTAL) BY MOUTH AT BEDTIME. 30 tablet 2  . Pyridoxine HCl (VITAMIN B-6 PO) Take 1 tablet by mouth daily. Unknown OTC strength    . Spacer/Aero-Holding Chambers (AEROCHAMBER MV) inhaler Use as instructed 1 each 0  . vitamin C (ASCORBIC ACID) 500 MG tablet Take 500 mg by mouth daily.      Current Facility-Administered Medications  Medication Dose Route Frequency Provider Last Rate Last Admin  . 0.9 %  sodium chloride infusion  500 mL Intravenous Once Pyrtle, Lajuan Lines, MD        Allergies as of 02/01/2021 - Review Complete 02/01/2021  Allergen Reaction Noted  . Amitriptyline  06/07/2013  . Fluticasone-salmeterol Other (See Comments) 05/11/2010  . Imitrex [sumatriptan]  05/21/2019  . Penicillins Itching and Swelling 07/17/2013  . Ciprofloxacin Itching and Rash 09/01/2012  . Macrobid [nitrofurantoin monohyd macro] Itching 09/01/2012  . Sulfamethoxazole-trimethoprim Rash     Vitals: BP (!) 152/98 (BP Location: Left Arm, Patient Position: Sitting)   Pulse 71   Ht 5\' 1"  (1.549 m)   Wt 202 lb (91.6 kg)   LMP 05/21/2017 (Exact Date)   SpO2 95%   BMI 38.17 kg/m  Last Weight:  Wt Readings from Last 1  Encounters:  02/01/21 202 lb (91.6 kg)   Last Height:   Ht Readings from Last 1 Encounters:  02/01/21 5\' 1"  (1.549 m)   Exam: NAD, pleasant                  Speech:    Speech is normal; fluent and spontaneous with normal comprehension.  Cognition:    The patient is oriented to person, place, and time;     recent and remote memory intact;     language fluent;    Cranial Nerves:    The pupils are equal, round, and reactive to light.Trigeminal sensation is intact and the muscles of mastication are normal. The face is symmetric. The palate elevates in the midline. Hearing intact. Voice is normal. Shoulder shrug is normal. The tongue has normal motion without fasciculations.   Coordination:  No dysmetria  Motor Observation:    No asymmetry, no atrophy, and no involuntary movements noted. Tone:    Normal muscle tone.     Strength:    Strength is V/V in the upper and lower limbs.      Sensation: intact to LT      Assessment/Plan:  55 year old with chronic migraines with status migrainosus, positional headaches, vision loss, pain on eye movement, morning headaches even though she is treated effectively with CPAP, morbid obesity, myofascial  cervical neck pain. She has failed multiple migraine medication classes.   Doing great, continue Ajovy  Meds ordered this encounter  Medications  . Fremanezumab-vfrm (AJOVY) 225 MG/1.5ML SOAJ    Sig: Inject 225 mg into the skin every 30 (thirty) days.    Dispense:  1.5 mL    Refill:  11  . Fremanezumab-vfrm (AJOVY) 225 MG/1.5ML SOAJ    Sig: Inject 225 mg into the skin every 30 (thirty) days.    Dispense:  4.5 mL    Refill:  3    Patient has copay card; she can have medication regardless of insurance approval or copay amount. Please dispoense a 55-month supply if possible, the copay card allows for 3 month dispensing.     Sarina Ill, MD  [facility] [address] [place], [place] [address]  Phone  [phone] Fax [phone]  I spent 20 minutes of face-to-face and non-face-to-face time with patient on the  1. Chronic migraine without aura with status migrainosus, not intractable    diagnosis.  This included previsit chart review, lab review, study review, order entry, electronic health record documentation, patient education on the different diagnostic and therapeutic options, counseling and coordination of care, risks and benefits of management, compliance, or risk factor reduction

## 2021-02-01 ENCOUNTER — Ambulatory Visit (INDEPENDENT_AMBULATORY_CARE_PROVIDER_SITE_OTHER): Payer: 59 | Admitting: Neurology

## 2021-02-01 ENCOUNTER — Other Ambulatory Visit (HOSPITAL_COMMUNITY): Payer: Self-pay

## 2021-02-01 ENCOUNTER — Other Ambulatory Visit: Payer: Self-pay | Admitting: Family Medicine

## 2021-02-01 ENCOUNTER — Encounter: Payer: Self-pay | Admitting: Neurology

## 2021-02-01 VITALS — BP 152/98 | HR 71 | Ht 61.0 in | Wt 202.0 lb

## 2021-02-01 DIAGNOSIS — G43701 Chronic migraine without aura, not intractable, with status migrainosus: Secondary | ICD-10-CM | POA: Diagnosis not present

## 2021-02-01 MED ORDER — AJOVY 225 MG/1.5ML ~~LOC~~ SOAJ
225.0000 mg | SUBCUTANEOUS | 11 refills | Status: DC
  Filled 2021-02-01 (×2): qty 1.5, 30d supply, fill #0
  Filled 2021-06-05 – 2021-11-09 (×2): qty 1.5, 30d supply, fill #1
  Filled 2022-01-16: qty 1.5, 30d supply, fill #2

## 2021-02-01 MED ORDER — AJOVY 225 MG/1.5ML ~~LOC~~ SOAJ
225.0000 mg | SUBCUTANEOUS | 3 refills | Status: DC
  Filled 2021-02-01: qty 4.5, 90d supply, fill #0
  Filled 2021-02-01: qty 4.5, fill #0
  Filled 2021-02-01 – 2021-07-19 (×2): qty 4.5, 90d supply, fill #0

## 2021-02-01 MED ORDER — ESOMEPRAZOLE MAGNESIUM 40 MG PO CPDR
40.0000 mg | DELAYED_RELEASE_CAPSULE | Freq: Every day | ORAL | 1 refills | Status: DC
  Filled 2021-02-01: qty 90, 90d supply, fill #0
  Filled 2021-06-05: qty 90, 90d supply, fill #1

## 2021-02-01 MED FILL — Escitalopram Oxalate Tab 20 MG (Base Equiv): ORAL | 30 days supply | Qty: 30 | Fill #0 | Status: AC

## 2021-02-01 MED FILL — Fluticasone-Umeclidinium-Vilanterol AEPB 200-62.5-25 MCG/ACT: RESPIRATORY_TRACT | 30 days supply | Qty: 60 | Fill #0 | Status: AC

## 2021-02-01 MED FILL — Gabapentin Cap 300 MG: ORAL | 30 days supply | Qty: 90 | Fill #0 | Status: AC

## 2021-02-01 MED FILL — Oxybutynin Chloride Tab ER 24HR 10 MG: ORAL | 30 days supply | Qty: 30 | Fill #0 | Status: CN

## 2021-02-01 MED FILL — Losartan Potassium Tab 50 MG: ORAL | 30 days supply | Qty: 30 | Fill #0 | Status: AC

## 2021-02-01 MED FILL — Albuterol Sulfate Inhal Aero 108 MCG/ACT (90MCG Base Equiv): RESPIRATORY_TRACT | 25 days supply | Qty: 8.5 | Fill #0 | Status: AC

## 2021-02-02 ENCOUNTER — Other Ambulatory Visit (HOSPITAL_COMMUNITY): Payer: Self-pay

## 2021-02-06 NOTE — Progress Notes (Signed)
    SUBJECTIVE:   CHIEF COMPLAINT / HPI: RUQ pain    RUQ Pain  Patient presents with report of RUQ pain that began at 10:30 this AM. She denies any prior episodes of pain like this. She reports some nausea but no emesis during the episode. She reports that the pain was located in the lower aspect of her chest so she used her inhalers and the pain eased up some. She also reports taking ibuprofen that helped to lessen the pain more. She denies fever, chills. She reports that pain did not appear to be associated with eating a meal. She reports normal appearing bowel movements without hematochezia or melena. She has no pain anywhere else in her abdomen.   PERTINENT  PMH / PSH:  Asthma  Migraines T2DM    OBJECTIVE:   BP 140/65   Pulse 75   Ht 5\' 1"  (1.549 m)   Wt 206 lb 12.8 oz (93.8 kg)   LMP 05/21/2017 (Exact Date)   SpO2 99%   BMI 39.07 kg/m   General: female appearing stated age, does not appear to be in distress at this time of exam  HEENT: MMM, no oral lesions noted Cardio: Normal S1 and S2, no S3 or S4. Rhythm is regular.  Pulm: Clear to auscultation bilaterally, no crackles, wheezing, or diminished breath sounds. Normal respiratory effort Abdomen: abdomen does not appear to be distended, mild tenderness in RUQ, no palpable hepatomegaly, no skin changes on abdomen, no bony deformities noted, normal percussion Extremities: No peripheral edema.  ASSESSMENT/PLAN:   RUQ pain RUQ pain new onset earlier this morning that is not associated with food intake. Differential includes gallbladder dysfunction cholelithiasis or bile sludge. Could consider hepatitis or fatty liver but would expect symptoms to last longer if hepatitis were the etiology. Could also consider MSK etiology given pain improved with NSAIDS. Also considered lower lung pleuritic pain as patient's pain improved with use of inhaler and considering location given patient's hx of COVID.  - will start with CMP  And lipase  as she reports some epigastric pain  - if no lab abnormalities, will recommend RUQ ultrasound      Eulis Foster, MD [facility]

## 2021-02-07 ENCOUNTER — Other Ambulatory Visit (HOSPITAL_COMMUNITY): Payer: Self-pay

## 2021-02-07 ENCOUNTER — Other Ambulatory Visit: Payer: Self-pay

## 2021-02-07 ENCOUNTER — Encounter: Payer: Self-pay | Admitting: Family Medicine

## 2021-02-07 ENCOUNTER — Ambulatory Visit (INDEPENDENT_AMBULATORY_CARE_PROVIDER_SITE_OTHER): Payer: 59 | Admitting: Family Medicine

## 2021-02-07 VITALS — BP 140/65 | HR 75 | Ht 61.0 in | Wt 206.8 lb

## 2021-02-07 DIAGNOSIS — R109 Unspecified abdominal pain: Secondary | ICD-10-CM | POA: Diagnosis not present

## 2021-02-07 DIAGNOSIS — G4733 Obstructive sleep apnea (adult) (pediatric): Secondary | ICD-10-CM | POA: Diagnosis not present

## 2021-02-07 DIAGNOSIS — R1011 Right upper quadrant pain: Secondary | ICD-10-CM | POA: Diagnosis not present

## 2021-02-07 DIAGNOSIS — J45901 Unspecified asthma with (acute) exacerbation: Secondary | ICD-10-CM | POA: Diagnosis not present

## 2021-02-07 MED ORDER — CETIRIZINE HCL 10 MG PO TABS
10.0000 mg | ORAL_TABLET | Freq: Every day | ORAL | 11 refills | Status: DC
  Filled 2021-02-07 – 2021-09-11 (×2): qty 30, 30d supply, fill #0

## 2021-02-07 NOTE — Patient Instructions (Signed)
For your right-sided abdominal pain, we will collect blood work.  I will notify you of any abnormal results via telephone to discuss follow-up plans from there.  Recommend follow-up in the next 4 weeks if we do not need to intervene sooner.   Nausea, Adult Nausea is feeling sick to your stomach or feeling that you are about to throw up (vomit). Feeling sick to your stomach is usually not serious, but it may be an early sign of a more serious medical problem. As you feel sicker to your stomach, you may throw up. If you throw up, or if you are not able to drink enough fluids, there is a risk that you may lose too much water in your body (get dehydrated). If you lose too much water in your body, you may:  Feel tired.  Feel thirsty.  Have a dry mouth.  Have cracked lips.  Go pee (urinate) less often. Older adults and people who have other diseases or a weak body defense system (immune system) have a higher risk of losing too much water in the body. The main goals of treating this condition are:  To relieve your nausea.  To ensure your nausea occurs less often.  To prevent throwing up and losing too much fluid. Follow these instructions at home: Watch your symptoms for any changes. Tell your doctor about them. Follow these instructions as told by your doctor. Eating and drinking  Take an ORS (oral rehydration solution). This is a drink that is sold at pharmacies and stores.  Drink clear fluids in small amounts as you are able. These include: ? Water. ? Ice chips. ? Fruit juice that has water added (diluted fruit juice). ? Low-calorie sports drinks.  Eat bland, easy-to-digest foods in small amounts as you are able, such as: ? Bananas. ? Applesauce. ? Rice. ? Low-fat (lean) meats. ? Toast. ? Crackers.  Avoid drinking fluids that have a lot of sugar or caffeine in them. This includes energy drinks, sports drinks, and soda.  Avoid alcohol.  Avoid spicy or fatty foods.       General instructions  Take over-the-counter and prescription medicines only as told by your doctor.  Rest at home while you get better.  Drink enough fluid to keep your pee (urine) pale yellow.  Take slow and deep breaths when you feel sick to your stomach.  Avoid food or things that have strong smells.  Wash your hands often with soap and water. If you cannot use soap and water, use hand sanitizer.  Make sure that all people in your home wash their hands well and often.  Keep all follow-up visits as told by your doctor. This is important. Contact a doctor if:  You feel sicker to your stomach.  You feel sick to your stomach for more than 2 days.  You throw up.  You are not able to drink fluids without throwing up.  You have new symptoms.  You have a fever.  You have a headache.  You have muscle cramps.  You have a rash.  You have pain while peeing.  You feel light-headed or dizzy. Get help right away if:  You have pain in your chest, neck, arm, or jaw.  You feel very weak or you pass out (faint).  You have throw up that is bright red or looks like coffee grounds.  You have bloody or black poop (stools) or poop that looks like tar.  You have a very bad headache, a stiff  neck, or both.  You have very bad pain, cramping, or bloating in your belly (abdomen).  You have trouble breathing or you are breathing very quickly.  Your heart is beating very quickly.  Your skin feels cold and clammy.  You feel confused.  You have signs of losing too much water in your body, such as: ? Dark pee, very little pee, or no pee. ? Cracked lips. ? Dry mouth. ? Sunken eyes. ? Sleepiness. ? Weakness. These symptoms may be an emergency. Do not wait to see if the symptoms will go away. Get medical help right away. Call your local emergency services (911 in the [place] Do not drive yourself to the hospital. Summary  Nausea is feeling sick to your stomach or feeling that  you are about to throw up (vomit).  If you throw up, or if you are not able to drink enough fluids, there is a risk that you may lose too much water in your body (get dehydrated).  Eat and drink what your doctor tells you. Take over-the-counter and prescription medicines only as told by your doctor.  Contact a doctor right away if your symptoms get worse or you have new symptoms.  Keep all follow-up visits as told by your doctor. This is important. This information is not intended to replace advice given to you by your health care provider. Make sure you discuss any questions you have with your health care provider. Document Revised: 09/15/2019 Document Reviewed: 03/25/2018 Elsevier Patient Education  2021 [employer].

## 2021-02-08 LAB — COMPREHENSIVE METABOLIC PANEL
ALT: 19 IU/L (ref 0–32)
AST: 14 IU/L (ref 0–40)
Albumin/Globulin Ratio: 1.5 (ref 1.2–2.2)
Albumin: 4.6 g/dL (ref 3.8–4.9)
Alkaline Phosphatase: 57 IU/L (ref 44–121)
BUN/Creatinine Ratio: 18 (ref 9–23)
BUN: 16 mg/dL (ref 6–24)
Bilirubin Total: <0.2 mg/dL (ref 0.0–1.2)
CO2: 24 mmol/L (ref 20–29)
Calcium: 9.6 mg/dL (ref 8.7–10.2)
Chloride: 101 mmol/L (ref 96–106)
Creatinine, Ser: 0.88 mg/dL (ref 0.57–1.00)
Globulin, Total: 3 g/dL (ref 1.5–4.5)
Glucose: 150 mg/dL — ABNORMAL HIGH (ref 65–99)
Potassium: 3.9 mmol/L (ref 3.5–5.2)
Sodium: 141 mmol/L (ref 134–144)
Total Protein: 7.6 g/dL (ref 6.0–8.5)
eGFR: 78 mL/min/{1.73_m2} (ref >59)

## 2021-02-08 LAB — LIPASE: Lipase: 36 U/L (ref 14–72)

## 2021-02-09 DIAGNOSIS — R1011 Right upper quadrant pain: Secondary | ICD-10-CM | POA: Insufficient documentation

## 2021-02-09 NOTE — Assessment & Plan Note (Signed)
RUQ pain new onset earlier this morning that is not associated with food intake. Differential includes gallbladder dysfunction cholelithiasis or bile sludge. Could consider hepatitis or fatty liver but would expect symptoms to last longer if hepatitis were the etiology. Could also consider MSK etiology given pain improved with NSAIDS. Also considered lower lung pleuritic pain as patient's pain improved with use of inhaler and considering location given patient's hx of COVID.  - will start with CMP  And lipase as she reports some epigastric pain  - if no lab abnormalities, will recommend RUQ ultrasound

## 2021-02-14 ENCOUNTER — Other Ambulatory Visit (HOSPITAL_COMMUNITY): Payer: Self-pay

## 2021-02-21 ENCOUNTER — Encounter: Payer: Self-pay | Admitting: Pulmonary Disease

## 2021-03-15 ENCOUNTER — Other Ambulatory Visit: Payer: Self-pay | Admitting: Family Medicine

## 2021-03-15 DIAGNOSIS — Z1231 Encounter for screening mammogram for malignant neoplasm of breast: Secondary | ICD-10-CM

## 2021-04-09 ENCOUNTER — Encounter (HOSPITAL_COMMUNITY): Payer: Self-pay | Admitting: *Deleted

## 2021-04-09 ENCOUNTER — Other Ambulatory Visit: Payer: Self-pay

## 2021-04-09 ENCOUNTER — Emergency Department (HOSPITAL_COMMUNITY)
Admission: EM | Admit: 2021-04-09 | Discharge: 2021-04-09 | Disposition: A | Discharge status: Home / Self Care | Payer: PRIVATE HEALTH INSURANCE | Attending: Emergency Medicine | Admitting: Emergency Medicine

## 2021-04-09 ENCOUNTER — Emergency Department (HOSPITAL_COMMUNITY): Payer: PRIVATE HEALTH INSURANCE

## 2021-04-09 DIAGNOSIS — Z87891 Personal history of nicotine dependence: Secondary | ICD-10-CM | POA: Diagnosis not present

## 2021-04-09 DIAGNOSIS — S0083XA Contusion of other part of head, initial encounter: Secondary | ICD-10-CM | POA: Diagnosis not present

## 2021-04-09 DIAGNOSIS — J45909 Unspecified asthma, uncomplicated: Secondary | ICD-10-CM | POA: Diagnosis not present

## 2021-04-09 DIAGNOSIS — Z8616 Personal history of COVID-19: Secondary | ICD-10-CM | POA: Insufficient documentation

## 2021-04-09 DIAGNOSIS — J449 Chronic obstructive pulmonary disease, unspecified: Secondary | ICD-10-CM | POA: Diagnosis not present

## 2021-04-09 DIAGNOSIS — Z79899 Other long term (current) drug therapy: Secondary | ICD-10-CM | POA: Insufficient documentation

## 2021-04-09 DIAGNOSIS — E1140 Type 2 diabetes mellitus with diabetic neuropathy, unspecified: Secondary | ICD-10-CM | POA: Insufficient documentation

## 2021-04-09 DIAGNOSIS — S0993XA Unspecified injury of face, initial encounter: Secondary | ICD-10-CM | POA: Diagnosis present

## 2021-04-09 DIAGNOSIS — Z7951 Long term (current) use of inhaled steroids: Secondary | ICD-10-CM | POA: Insufficient documentation

## 2021-04-09 DIAGNOSIS — Y990 Civilian activity done for income or pay: Secondary | ICD-10-CM | POA: Insufficient documentation

## 2021-04-09 DIAGNOSIS — I10 Essential (primary) hypertension: Secondary | ICD-10-CM | POA: Diagnosis not present

## 2021-04-09 NOTE — Discharge Instructions (Addendum)
Rest.  Ice for 20 minutes every 2 hours while awake for the next 2 days.  Take ibuprofen 600 mg every 6 hours as needed for pain.
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
## 2021-04-09 NOTE — ED Notes (Signed)
To ct
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
## 2021-04-10 ENCOUNTER — Telehealth: Payer: Self-pay | Admitting: Pulmonary Disease

## 2021-04-10 NOTE — Telephone Encounter (Signed)
Lm x2 for patient.   

## 2021-04-10 NOTE — Telephone Encounter (Signed)
Lm for patient.  

## 2021-04-11 ENCOUNTER — Encounter: Payer: Self-pay | Admitting: Adult Health

## 2021-04-11 ENCOUNTER — Telehealth (INDEPENDENT_AMBULATORY_CARE_PROVIDER_SITE_OTHER): Payer: 59 | Admitting: Adult Health

## 2021-04-11 ENCOUNTER — Other Ambulatory Visit: Payer: Self-pay

## 2021-04-11 DIAGNOSIS — J3089 Other allergic rhinitis: Secondary | ICD-10-CM

## 2021-04-11 DIAGNOSIS — J4540 Moderate persistent asthma, uncomplicated: Secondary | ICD-10-CM | POA: Diagnosis not present
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
## 2021-04-11 NOTE — Patient Instructions (Signed)
Saline nasal rinses twice daily Continue on Claritin 10 mg daily Continue on Flonase 2 puffs daily May try Afrin 1 puff daily x2 days only May try low-dose Sudafed as needed for next 2 to 3 days for sinus pain and pressure. If symptoms or not improving or worsen will need sooner follow-up. Follow-up with Dr. Patsey Berthold in 3 months and as needed

## 2021-04-11 NOTE — Telephone Encounter (Signed)
Can add double book today , video visit if available   Please contact office for sooner follow up if symptoms do not improve or worsen or seek emergency care

## 2021-04-11 NOTE — Telephone Encounter (Signed)
Video visit scheduled for 12:00 today.  Patient is aware and voiced her understanding.  Nothing further needed.

## 2021-04-11 NOTE — Telephone Encounter (Signed)
Dr. Patsey Berthold patient last seen 01/24/2021 for asthma.  Patient reports of sinus headache, sinus pressure, increased sob, wheezing and prod cough with clear sputum. Sx have been present for 4d. Using trelegy daily, albuterol BID and sudafed BID with mild relief. Patient stated that she plans to contact PCP regarding sinus sx. Fully vaccinated against covid and flu.    Tammy Parrett, please advise.

## 2021-04-13 ENCOUNTER — Telehealth: Payer: Self-pay

## 2021-04-13 NOTE — Progress Notes (Signed)
Agree with the details of the visit as noted by Tammy Parrett, NP.  C. Laura Jet Traynham, MD  PCCM 

## 2021-04-13 NOTE — Telephone Encounter (Signed)
Received record request from [facility]. Request has been faxed to medical records.

## 2021-05-02 NOTE — Progress Notes (Signed)
    SUBJECTIVE:   CHIEF COMPLAINT / HPI:   Patient notes urgency and burning while urinating x2 weeks. She states it is similar to previous UTIs she has had in the past and she has the to urinate every 15 to 20 minutes. Even after urinating, she still feels like she has to urinate. She notes some pain on her right side radiating to her back. She has tried cranberry juice and drinking water regularly but generally needs Keflex prescription to resolve issue.  Patient also states her urine usually needs to be cultured in order to show bacteria.  She denies any blood or fever.    Patient is also concerned of vaginal dryness x3 weeks.  She used to use vaginal cream and estrogen pill prescribed by her gynecologist.  She has not been using these due to her gynecologist moving and her having followed up with a new gynecologist.  She denies trying any other lubricants except platinum as they irritate her.  Patient denies any bleeding during intercourse states it is strictly irritation.  She will be going on vacation this weekend and is hoping there will be something that she can have prescribed for this.  Patient is requesting refill on inhalers and Ajovy today for her vacation that is coming up this weekend.  PERTINENT  PMH / PSH:  Chronic hypertension, UTI, vaginal dryness  Hysterectomy  OBJECTIVE:   BP (!) 172/98   Pulse 67   Ht 5\' 1"  (1.549 m)   Wt 205 lb (93 kg)   LMP 05/21/2017 (Exact Date)   SpO2 98%   BMI 38.73 kg/m    General: NAD, pleasant, able to participate in exam Cardiac: RRR, no murmurs. Respiratory: CTAB, normal effort, No wheezes or rhonchi Abdomen: Soft nontender throughout except on right flank, right CVA tenderness   ASSESSMENT/PLAN:   HTN (hypertension) Hypertension controlled but elevated today.  Patient takes losartan 50 mg daily.  I have advised patient to check blood pressures at home. Recommended patient return to clinic to address this and obtain further blood  pressure management.  UTI (urinary tract infection) Urgency and dysuria similar to previous UTIs, per patient, on this clinical diagnosis towards UTI.  Obtained urinalysis which did not reveal any bacteria, nitrites or blood. Urine sent for culture. Differential includes vaginitis, cystitis. Sent in prescription for Keflex 500 mg 4 times daily x7 days.  Advised patient to call should she begin to experience fever or not have symptoms fully resolved.  Vaginal dryness Continued concern the patient has had previous assistance with her gynecologist and now addressing at primary care due to gynecological practice moving.  Advised patient to continue using lubricants that do not irritate her.  Advised her to follow-up with gynecology for prescription of vaginal cream or estrogen supplementation as necessary.   Discussed with patient that inhalers and Ajovy may best be prescribed by her pulmonologist and neurologist, respectively.  Patient agreeable and reaching out to them for her refills.   Wells Guiles, Lucas

## 2021-05-03 ENCOUNTER — Other Ambulatory Visit (HOSPITAL_COMMUNITY): Payer: Self-pay

## 2021-05-03 ENCOUNTER — Ambulatory Visit: Payer: 59 | Admitting: Student

## 2021-05-03 ENCOUNTER — Other Ambulatory Visit: Payer: Self-pay

## 2021-05-03 VITALS — BP 172/98 | HR 67 | Ht 61.0 in | Wt 205.0 lb

## 2021-05-03 DIAGNOSIS — N342 Other urethritis: Secondary | ICD-10-CM

## 2021-05-03 DIAGNOSIS — I10 Essential (primary) hypertension: Secondary | ICD-10-CM | POA: Diagnosis not present

## 2021-05-03 DIAGNOSIS — N898 Other specified noninflammatory disorders of vagina: Secondary | ICD-10-CM | POA: Diagnosis not present

## 2021-05-03 DIAGNOSIS — G4733 Obstructive sleep apnea (adult) (pediatric): Secondary | ICD-10-CM | POA: Diagnosis not present

## 2021-05-03 DIAGNOSIS — R300 Dysuria: Secondary | ICD-10-CM | POA: Diagnosis not present

## 2021-05-03 DIAGNOSIS — J45901 Unspecified asthma with (acute) exacerbation: Secondary | ICD-10-CM | POA: Diagnosis not present

## 2021-05-03 LAB — POCT URINALYSIS DIP (MANUAL ENTRY)
Bilirubin, UA: NEGATIVE
Blood, UA: NEGATIVE
Glucose, UA: NEGATIVE mg/dL
Ketones, POC UA: NEGATIVE mg/dL
Leukocytes, UA: NEGATIVE
Nitrite, UA: NEGATIVE
Protein Ur, POC: NEGATIVE mg/dL
Spec Grav, UA: 1.025 (ref 1.010–1.025)
Urobilinogen, UA: 0.2 E.U./dL
pH, UA: 5.5 (ref 5.0–8.0)

## 2021-05-03 MED ORDER — CEPHALEXIN 500 MG PO CAPS
500.0000 mg | ORAL_CAPSULE | Freq: Four times a day (QID) | ORAL | 0 refills | Status: DC
  Filled 2021-05-03: qty 28, 7d supply, fill #0

## 2021-05-03 NOTE — Assessment & Plan Note (Addendum)
Urgency and dysuria similar to previous UTIs, per patient, on this clinical diagnosis towards UTI.  Obtained urinalysis which did not reveal any bacteria, nitrites or blood. Urine sent for culture. Differential includes vaginitis, cystitis. Sent in prescription for Keflex 500 mg 4 times daily x7 days.  Advised patient to call should she begin to experience fever or not have symptoms fully resolved.

## 2021-05-03 NOTE — Assessment & Plan Note (Signed)
Continued concern the patient has had previous assistance with her gynecologist and now addressing at primary care due to gynecological practice moving.  Advised patient to continue using lubricants that do not irritate her.  Advised her to follow-up with gynecology for prescription of vaginal cream or estrogen supplementation as necessary.

## 2021-05-03 NOTE — Assessment & Plan Note (Addendum)
Hypertension controlled but elevated today.  Patient takes losartan 50 mg daily.  I have advised patient to check blood pressures at home. Recommended patient return to clinic to address this and obtain further blood pressure management.

## 2021-05-03 NOTE — Patient Instructions (Signed)
Thank you for coming in today.   For your UTI, I believe Keflex 500mg  four times daily x 7 days is sufficient to treat this and any progress the infection has made. You should start to progressively feel better in 2-3 days. Please continue to take the antibiotic for its full course. If the symptoms return or do not resolve after antibiotic or you begin to have a fever, please contact me.  Your vaginal dryness sounds to be exacerbated by the UTI and I am hopeful that treating it will assist in your irritation. I would also advise continuing to use a lubricant that suits you well and does not further dry you out. I'd advise finding a new Gynecologist to assist you in the hormonal treatments for this as well.  I am happy to refill your inhalers but we prefer to keep prescribed medications with the providers that prescribe them. I'd advise you to reach out to your Pulmonologist for that prescription if you're able. This is also what I would suggest for your Ajovy prescription from Neurology.

## 2021-05-05 LAB — URINE CULTURE

## 2021-05-09 ENCOUNTER — Encounter: Payer: Self-pay | Admitting: Student

## 2021-05-12 ENCOUNTER — Ambulatory Visit
Admission: RE | Admit: 2021-05-12 | Discharge: 2021-05-12 | Disposition: A | Discharge status: Home / Self Care | Payer: 59 | Source: Ambulatory Visit | Attending: *Deleted | Admitting: *Deleted

## 2021-05-12 ENCOUNTER — Other Ambulatory Visit: Payer: Self-pay

## 2021-05-12 DIAGNOSIS — Z1231 Encounter for screening mammogram for malignant neoplasm of breast: Secondary | ICD-10-CM

## 2021-06-05 ENCOUNTER — Ambulatory Visit: Payer: 59 | Admitting: Family Medicine

## 2021-06-05 ENCOUNTER — Encounter: Payer: Self-pay | Admitting: Family Medicine

## 2021-06-05 ENCOUNTER — Other Ambulatory Visit: Payer: Self-pay

## 2021-06-05 ENCOUNTER — Other Ambulatory Visit (HOSPITAL_COMMUNITY): Payer: Self-pay

## 2021-06-05 DIAGNOSIS — G4489 Other headache syndrome: Secondary | ICD-10-CM

## 2021-06-05 DIAGNOSIS — I10 Essential (primary) hypertension: Secondary | ICD-10-CM

## 2021-06-05 DIAGNOSIS — E1140 Type 2 diabetes mellitus with diabetic neuropathy, unspecified: Secondary | ICD-10-CM | POA: Diagnosis not present

## 2021-06-05 DIAGNOSIS — J4540 Moderate persistent asthma, uncomplicated: Secondary | ICD-10-CM

## 2021-06-05 DIAGNOSIS — R7303 Prediabetes: Secondary | ICD-10-CM | POA: Diagnosis not present

## 2021-06-05 DIAGNOSIS — R5382 Chronic fatigue, unspecified: Secondary | ICD-10-CM | POA: Diagnosis not present

## 2021-06-05 DIAGNOSIS — F418 Other specified anxiety disorders: Secondary | ICD-10-CM

## 2021-06-05 LAB — POCT GLYCOSYLATED HEMOGLOBIN (HGB A1C): HbA1c, POC (controlled diabetic range): 6.4 % (ref 0.0–7.0)

## 2021-06-05 MED ORDER — SERTRALINE HCL 50 MG PO TABS
50.0000 mg | ORAL_TABLET | Freq: Every day | ORAL | 3 refills | Status: DC
  Filled 2021-06-05: qty 30, 30d supply, fill #0

## 2021-06-05 MED ORDER — LOSARTAN POTASSIUM 100 MG PO TABS
100.0000 mg | ORAL_TABLET | Freq: Every day | ORAL | 3 refills | Status: DC
  Filled 2021-06-05: qty 90, 90d supply, fill #0

## 2021-06-05 MED FILL — Fluticasone-Umeclidinium-Vilanterol AEPB 200-62.5-25 MCG/ACT: RESPIRATORY_TRACT | 90 days supply | Qty: 180 | Fill #1 | Status: AC

## 2021-06-05 MED FILL — Fluticasone Propionate Nasal Susp 50 MCG/ACT: NASAL | 30 days supply | Qty: 16 | Fill #0 | Status: AC

## 2021-06-05 MED FILL — Gabapentin Cap 300 MG: ORAL | 30 days supply | Qty: 90 | Fill #1 | Status: AC

## 2021-06-05 MED FILL — Albuterol Sulfate Inhal Aero 108 MCG/ACT (90MCG Base Equiv): RESPIRATORY_TRACT | 13 days supply | Qty: 8.5 | Fill #1 | Status: AC

## 2021-06-05 MED FILL — Oxybutynin Chloride Tab ER 24HR 10 MG: ORAL | 30 days supply | Qty: 30 | Fill #0 | Status: AC

## 2021-06-05 NOTE — Assessment & Plan Note (Signed)
Continue current treatment. 

## 2021-06-05 NOTE — Assessment & Plan Note (Signed)
For now, focus will be to control blood pressure.

## 2021-06-05 NOTE — Assessment & Plan Note (Signed)
DC lexapro and switch back to zoloft.

## 2021-06-05 NOTE — Assessment & Plan Note (Signed)
Part of her not feeling right since covid.

## 2021-06-05 NOTE — Assessment & Plan Note (Signed)
Poor control.  Increase losartan and check lipids and BMP

## 2021-06-05 NOTE — Assessment & Plan Note (Signed)
Not diabetic.  Barely in prediabetic range.

## 2021-06-05 NOTE — Patient Instructions (Signed)
I am testing you again to see if you have diabetes or prediabetes. I am checking your cholesterol again to see if you need cholesterol lowering medications. Stop you lexapro and start back on zoloft/sertraline.  I hope it works and does not cause nausea. Increase your losartan to 100 mg a day.  I sent in a new script.  You can use up your old prescription by taking two pills per day. I hope these are good changes for you.  I doubt that I have solved all the problems.  I hope I am pointing you in the right direction.  Come back in 2-3 weeks so that we can keep you moving in the right direction.

## 2021-06-05 NOTE — Progress Notes (Signed)
    SUBJECTIVE:   CHIEF COMPLAINT / HPI:   Multiple problems: Frequent headaches.  Possible related to HBP (see next)  Previously dxed with both migraines and tension HA.  These seem migraine to her.  No photophobia or phonophbia.  Does have chronic nausea.  Stress higher lately Asthma/COPD.  On trelegy and albuterol.  Has some chest tightness which is relieved by albuterol HBP Significal increases recently.  BP has been quick high around the time of her headaches.Current meds are losartan 50 and lasix. States had COVID in Jan/Feb and has not been right since.  More anxiety, more headaches, less energy Both diabetes and prediabetes are on her problem list.  No A1C in three years.  A1C is barely in prediabetes range. Unclear ASCVD risk.  Not on statin. Anxiety med, lexapro, causing nausea.  Was on zoloft in the remote past.  Unclear why it was stopped or switched.      OBJECTIVE:   BP (!) 160/88   Pulse 61   Ht '5\' 1"'$  (1.549 m)   Wt 207 lb 3.2 oz (94 kg)   LMP 05/21/2017 (Exact Date)   SpO2 97%   BMI 39.15 kg/m   Lungs clear Cardiac RRR without m or g Abd benign.   Ext no edema Neuro, CN, motor, sensory, gait, affect and cognition all grossly normal.    ASSESSMENT/PLAN:   Pre-diabetes Not diabetic.  Barely in prediabetic range.    HTN (hypertension) Poor control.  Increase losartan and check lipids and BMP  Fatigue Part of her not feeling right since covid.  Depression with anxiety DC lexapro and switch back to zoloft.    Asthma Continue current treatment.  Chronic mixed headache syndrome For now, focus will be to control blood pressure.     Zenia Resides, MD [place]

## 2021-06-06 ENCOUNTER — Telehealth: Payer: Self-pay | Admitting: Family Medicine

## 2021-06-06 ENCOUNTER — Other Ambulatory Visit (HOSPITAL_COMMUNITY): Payer: Self-pay

## 2021-06-06 DIAGNOSIS — E7800 Pure hypercholesterolemia, unspecified: Secondary | ICD-10-CM

## 2021-06-06 LAB — BASIC METABOLIC PANEL
BUN/Creatinine Ratio: 17 (ref 9–23)
BUN: 15 mg/dL (ref 6–24)
CO2: 28 mmol/L (ref 20–29)
Calcium: 9.8 mg/dL (ref 8.7–10.2)
Chloride: 103 mmol/L (ref 96–106)
Creatinine, Ser: 0.89 mg/dL (ref 0.57–1.00)
Glucose: 86 mg/dL (ref 65–99)
Potassium: 4.3 mmol/L (ref 3.5–5.2)
Sodium: 146 mmol/L — ABNORMAL HIGH (ref 134–144)
eGFR: 77 mL/min/{1.73_m2} (ref >59)

## 2021-06-06 LAB — LIPID PANEL
Chol/HDL Ratio: 3.8 ratio (ref 0.0–4.4)
Cholesterol, Total: 214 mg/dL — ABNORMAL HIGH (ref 100–199)
HDL: 57 mg/dL (ref >39)
LDL Chol Calc (NIH): 130 mg/dL — ABNORMAL HIGH (ref 0–99)
Triglycerides: 151 mg/dL — ABNORMAL HIGH (ref 0–149)
VLDL Cholesterol Cal: 27 mg/dL (ref 5–40)

## 2021-06-06 MED ORDER — ROSUVASTATIN CALCIUM 20 MG PO TABS
20.0000 mg | ORAL_TABLET | Freq: Every day | ORAL | 3 refills | Status: DC
  Filled 2021-06-06: qty 90, 90d supply, fill #0

## 2021-06-06 NOTE — Telephone Encounter (Signed)
Called with labs.  See hypercholesterolemia on problem list.

## 2021-06-06 NOTE — Assessment & Plan Note (Signed)
10 year ASCVD risk=10.2%  Called with results and recommended Crestor.  Patient agrees.  Rx sents

## 2021-06-07 ENCOUNTER — Telehealth: Payer: Self-pay

## 2021-06-07 ENCOUNTER — Other Ambulatory Visit (HOSPITAL_COMMUNITY): Payer: Self-pay

## 2021-06-07 NOTE — Telephone Encounter (Signed)
I submitted a PA request for Ajovy on CMM, Key: [id number] - PA Case ID: [id number].   Awaiting determination from [employer].

## 2021-06-14 ENCOUNTER — Other Ambulatory Visit (HOSPITAL_COMMUNITY): Payer: Self-pay

## 2021-06-21 ENCOUNTER — Ambulatory Visit: Payer: 59 | Admitting: Family Medicine

## 2021-06-26 ENCOUNTER — Telehealth: Payer: Self-pay | Admitting: Pulmonary Disease

## 2021-06-26 NOTE — Telephone Encounter (Signed)
Spoke to patient and requested that she bring cpap SD card to visit tomorrow.

## 2021-06-27 ENCOUNTER — Other Ambulatory Visit
Admission: RE | Admit: 2021-06-27 | Discharge: 2021-06-27 | Disposition: A | Discharge status: Home / Self Care | Payer: 59 | Source: Ambulatory Visit | Attending: Pulmonary Disease | Admitting: Pulmonary Disease

## 2021-06-27 ENCOUNTER — Ambulatory Visit: Payer: 59 | Admitting: Pulmonary Disease

## 2021-06-27 ENCOUNTER — Other Ambulatory Visit: Payer: Self-pay

## 2021-06-27 ENCOUNTER — Encounter: Payer: Self-pay | Admitting: Pulmonary Disease

## 2021-06-27 VITALS — BP 140/90 | HR 80 | Temp 98.3°F | Ht 61.0 in | Wt 206.2 lb

## 2021-06-27 DIAGNOSIS — Z9989 Dependence on other enabling machines and devices: Secondary | ICD-10-CM | POA: Diagnosis not present

## 2021-06-27 DIAGNOSIS — J3089 Other allergic rhinitis: Secondary | ICD-10-CM | POA: Diagnosis not present

## 2021-06-27 DIAGNOSIS — J4540 Moderate persistent asthma, uncomplicated: Secondary | ICD-10-CM

## 2021-06-27 DIAGNOSIS — Z8616 Personal history of COVID-19: Secondary | ICD-10-CM | POA: Diagnosis not present

## 2021-06-27 DIAGNOSIS — Z01812 Encounter for preprocedural laboratory examination: Secondary | ICD-10-CM | POA: Insufficient documentation

## 2021-06-27 DIAGNOSIS — G4733 Obstructive sleep apnea (adult) (pediatric): Secondary | ICD-10-CM

## 2021-06-27 DIAGNOSIS — R0602 Shortness of breath: Secondary | ICD-10-CM | POA: Diagnosis not present

## 2021-06-27 DIAGNOSIS — Z20822 Contact with and (suspected) exposure to covid-19: Secondary | ICD-10-CM | POA: Diagnosis not present
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
## 2021-06-27 NOTE — Patient Instructions (Addendum)
I will review the download of your SD card  We will get the another temporary handicap sticker   We will order an overnight oximetry on your CPAP  We will see him in follow-up and 4 to 6 weeks time with either me or the nurse practitioner

## 2021-06-28 ENCOUNTER — Ambulatory Visit: Payer: 59 | Attending: Pulmonary Disease

## 2021-06-28 DIAGNOSIS — J4540 Moderate persistent asthma, uncomplicated: Secondary | ICD-10-CM | POA: Diagnosis not present

## 2021-06-28 LAB — SARS CORONAVIRUS 2 (TAT 6-24 HRS): SARS Coronavirus 2: NEGATIVE

## 2021-07-19 ENCOUNTER — Other Ambulatory Visit (HOSPITAL_COMMUNITY): Payer: Self-pay

## 2021-07-19 ENCOUNTER — Ambulatory Visit: Payer: 59 | Admitting: Family Medicine

## 2021-07-19 ENCOUNTER — Encounter: Payer: Self-pay | Admitting: Family Medicine

## 2021-07-19 ENCOUNTER — Other Ambulatory Visit: Payer: Self-pay

## 2021-07-19 DIAGNOSIS — I10 Essential (primary) hypertension: Secondary | ICD-10-CM

## 2021-07-19 MED ORDER — LOSARTAN POTASSIUM-HCTZ 100-25 MG PO TABS
1.0000 | ORAL_TABLET | Freq: Every day | ORAL | 1 refills | Status: DC
  Filled 2021-07-19: qty 90, 90d supply, fill #0
  Filled 2021-11-07: qty 90, 90d supply, fill #1

## 2021-07-19 NOTE — Progress Notes (Signed)
    SUBJECTIVE:   CHIEF COMPLAINT / HPI: follow up for HTN   HTN  Patient was recently evaluated in our clinic.  At that time blood pressure was elevated and her losartan dose was increased to 100 mg daily.  She reports that she has been able to adhere to this regimen without any adverse effects.  She states that she has noted her blood pressure continues to be elevated, patient works in the ED and states that she measured her blood pressure earlier this morning and it was 179/102.  She reports that she also woke up this morning with a headache that felt different than her migraines and felt that she was mildly dizzy.  She reports that she has taken her medication for today and is agreeable to changing blood pressure regimen.  Patient states that she did not have problems with hypertension prior to her COVID illness.  She denies any chest pain or blurry vision at this time.  She reports that her headache has greatly increased in severity since this morning.  PERTINENT  PMH / PSH:  HTN  Asthma  OSA  PreDM   OBJECTIVE:   BP (!) 176/99   Pulse 74   Wt 207 lb 12.8 oz (94.3 kg)   LMP 05/21/2017 (Exact Date)   SpO2 98%   BMI 39.26 kg/m   General: female appearing stated age in no acute distress Cardio: Normal S1 and S2, no S3 or S4. Rhythm is regular.  Pulm: Cno crackles, no wheezing, exam notable for bilaterally diminished breath sounds. Normal respiratory effort, stable on room air Abdomen: Bowel sounds normal. Abdomen soft and non-tender.  Extremities: No peripheral edema. Warm/ well perfused.  Neuro: pt alert and oriented x4, demonstrates equal pupils that are reactive to light, extraocular muscles are intact bilaterally, cranial nerves II-XII grossly intact bilaterally, 5/5 strength in bilateral upper and lower extremities, sensation is intact in bilateral upper and lower extremities to light touch   ASSESSMENT/PLAN:   HTN (hypertension) Patient with blood pressure elevated in  office today.  Patient had headache earlier this morning that has improved.  We will discontinue patient's Lasix and losartan 100 mg. Will start hydrochlorothiazide and losartan combination at 100-25 mg daily, counseled patient on return precautions of also chest pain simultaneously with headache and elevated blood pressure Advised patient to check blood pressure daily and to notify our office if she has blood pressures persistently elevated above 583 ENMMHWKG/88 diastolic Can consider adding amlodipine to her current regiment if blood pressures remain elevated at follow-up Patient to follow-up on 10/19 with PCP     Eulis Foster, MD [facility]

## 2021-07-19 NOTE — Patient Instructions (Addendum)
I have changed her losartan 100 mg to a combination pill of losartan and hydrochlorothiazide.  Please take this daily beginning during your next shift tomorrow morning.  Please follow-up with me on 08/14/2021, at that time we will check your blood levels to make sure you not have any elevated potassium or changes to kidney function.   If you continue to have elevated blood pressures, recommend scheduling a same-day appointment in our clinic next week or the week after for reevaluation sooner than our scheduled appointment.  Please be evaluated in the ED if you begin to have worsening headache symptoms or have any chest pain associated with your headache and elevated blood pressure.  I recommended to check your blood pressure once daily.

## 2021-07-19 NOTE — Assessment & Plan Note (Signed)
Patient with blood pressure elevated in office today.  Patient had headache earlier this morning that has improved.  We will discontinue patient's Lasix and losartan 100 mg. Will start hydrochlorothiazide and losartan combination at 100-25 mg daily, counseled patient on return precautions of also chest pain simultaneously with headache and elevated blood pressure Advised patient to check blood pressure daily and to notify our office if she has blood pressures persistently elevated above 239 [id number]/35 diastolic Can consider adding amlodipine to her current regiment if blood pressures remain elevated at follow-up Patient to follow-up on 10/19 with PCP

## 2021-07-26 DIAGNOSIS — G4733 Obstructive sleep apnea (adult) (pediatric): Secondary | ICD-10-CM | POA: Diagnosis not present

## 2021-07-26 DIAGNOSIS — J45901 Unspecified asthma with (acute) exacerbation: Secondary | ICD-10-CM | POA: Diagnosis not present

## 2021-08-03 ENCOUNTER — Other Ambulatory Visit (HOSPITAL_COMMUNITY): Payer: Self-pay

## 2021-08-03 ENCOUNTER — Ambulatory Visit: Payer: 59 | Admitting: Pulmonary Disease

## 2021-08-03 ENCOUNTER — Other Ambulatory Visit: Payer: Self-pay

## 2021-08-03 ENCOUNTER — Encounter: Payer: Self-pay | Admitting: Pulmonary Disease

## 2021-08-03 VITALS — BP 126/74 | HR 77 | Temp 97.6°F | Ht 61.0 in | Wt 207.0 lb

## 2021-08-03 DIAGNOSIS — K219 Gastro-esophageal reflux disease without esophagitis: Secondary | ICD-10-CM | POA: Diagnosis not present

## 2021-08-03 DIAGNOSIS — R0602 Shortness of breath: Secondary | ICD-10-CM | POA: Diagnosis not present

## 2021-08-03 DIAGNOSIS — J329 Chronic sinusitis, unspecified: Secondary | ICD-10-CM | POA: Diagnosis not present

## 2021-08-03 DIAGNOSIS — Z9989 Dependence on other enabling machines and devices: Secondary | ICD-10-CM | POA: Diagnosis not present

## 2021-08-03 DIAGNOSIS — J4540 Moderate persistent asthma, uncomplicated: Secondary | ICD-10-CM

## 2021-08-03 DIAGNOSIS — G4733 Obstructive sleep apnea (adult) (pediatric): Secondary | ICD-10-CM | POA: Diagnosis not present

## 2021-08-03 DIAGNOSIS — J310 Chronic rhinitis: Secondary | ICD-10-CM

## 2021-08-03 MED ORDER — ALBUTEROL SULFATE HFA 108 (90 BASE) MCG/ACT IN AERS
2.0000 | INHALATION_SPRAY | Freq: Four times a day (QID) | RESPIRATORY_TRACT | 6 refills | Status: DC | PRN
  Filled 2021-08-03 – 2021-09-11 (×2): qty 8.5, 25d supply, fill #0
  Filled 2021-11-07: qty 8.5, 25d supply, fill #1
  Filled 2022-01-16: qty 8.5, 25d supply, fill #2
  Filled 2022-02-23: qty 8.5, 25d supply, fill #3
  Filled 2022-06-21: qty 6.7, 25d supply, fill #4

## 2021-08-03 MED ORDER — BREZTRI AEROSPHERE 160-9-4.8 MCG/ACT IN AERO: 2.0000 | INHALATION_SPRAY | Freq: Two times a day (BID) | RESPIRATORY_TRACT | 0 refills | Status: DC
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
## 2021-08-03 NOTE — Patient Instructions (Signed)
We are giving you a trial of Breztri 2 puffs twice a day.  If you do well with this medication please let us know so we can send the prescription in.  I am refilling your albuterol inhaler.  DO NOT USE THE TRELEGY WHILE USING THE BREZTRI.  We will see him in follow-up in 4 to 6 weeks time call sooner should any new problems arise.  You may see me or the nurse practitioner at that time.

## 2021-08-09 ENCOUNTER — Encounter: Payer: Self-pay | Admitting: Pulmonary Disease

## 2021-08-15 ENCOUNTER — Other Ambulatory Visit (HOSPITAL_COMMUNITY): Payer: Self-pay

## 2021-08-16 ENCOUNTER — Ambulatory Visit: Payer: 59 | Admitting: Family Medicine

## 2021-08-16 ENCOUNTER — Other Ambulatory Visit: Payer: Self-pay

## 2021-08-16 ENCOUNTER — Encounter: Payer: Self-pay | Admitting: Family Medicine

## 2021-08-16 VITALS — BP 142/72 | HR 65 | Wt 208.0 lb

## 2021-08-16 DIAGNOSIS — I10 Essential (primary) hypertension: Secondary | ICD-10-CM

## 2021-08-16 NOTE — Patient Instructions (Signed)
We will check your potassium today to see if that is contributing to your leg cramps.   Your blood pressure looks a lot better on the medication. Please continue this and plan to follow up with me in 1 month.

## 2021-08-16 NOTE — Progress Notes (Signed)
    SUBJECTIVE:   CHIEF COMPLAINT / HPI: HTN f/u   Patient presents for HTN follow up. Last visit, she was started on losartan-HCTZ combination pill. Today she reports feeling well. She denies symptoms of hypotension. She reports regular adherence with regimen. She has noticed some leg cramps and has been eating mustard to try and combat them.   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 Rhythm is regular. No murmurs or rubs.  Bilateral radial pulses palpable Pulm: no wheezing today, bilateral diminished breath sounds. Normal respiratory effort, stable on RA Abdomen: Bowel sounds normal. Abdomen soft and non-tender.  Extremities: No peripheral edema. Warm/ well perfused.  Neuro: pt alert and oriented x4    ASSESSMENT/PLAN:   HTN (hypertension) Improved BP in office today. Patient tolerating Hyzaar well but does note some leg cramping. Will check BMP today to assess for any electrolyte derangements.      Eulis Foster, MD [facility]

## 2021-08-17 LAB — BASIC METABOLIC PANEL
BUN/Creatinine Ratio: 24 — ABNORMAL HIGH (ref 9–23)
BUN: 25 mg/dL — ABNORMAL HIGH (ref 6–24)
CO2: 25 mmol/L (ref 20–29)
Calcium: 9.8 mg/dL (ref 8.7–10.2)
Chloride: 100 mmol/L (ref 96–106)
Creatinine, Ser: 1.05 mg/dL — ABNORMAL HIGH (ref 0.57–1.00)
Glucose: 173 mg/dL — ABNORMAL HIGH (ref 70–99)
Potassium: 3.8 mmol/L (ref 3.5–5.2)
Sodium: 141 mmol/L (ref 134–144)
eGFR: 63 mL/min/{1.73_m2} (ref >59)

## 2021-08-18 ENCOUNTER — Encounter: Payer: Self-pay | Admitting: Family Medicine

## 2021-08-18 NOTE — Assessment & Plan Note (Signed)
Improved BP in office today. Patient tolerating Hyzaar well but does note some leg cramping. Will check BMP today to assess for any electrolyte derangements.

## 2021-09-11 ENCOUNTER — Other Ambulatory Visit: Payer: Self-pay | Admitting: Pulmonary Disease

## 2021-09-11 ENCOUNTER — Telehealth: Payer: Self-pay | Admitting: Pulmonary Disease

## 2021-09-11 ENCOUNTER — Other Ambulatory Visit: Payer: Self-pay | Admitting: Family Medicine

## 2021-09-11 ENCOUNTER — Other Ambulatory Visit (HOSPITAL_COMMUNITY): Payer: Self-pay

## 2021-09-11 DIAGNOSIS — R102 Pelvic and perineal pain: Secondary | ICD-10-CM

## 2021-09-11 MED ORDER — GABAPENTIN 300 MG PO CAPS
300.0000 mg | ORAL_CAPSULE | Freq: Three times a day (TID) | ORAL | 2 refills | Status: DC
  Filled 2021-09-11: qty 90, 30d supply, fill #0
  Filled 2021-11-07: qty 90, 30d supply, fill #1
  Filled 2022-01-16: qty 90, 30d supply, fill #2

## 2021-09-11 MED ORDER — ALBUTEROL SULFATE (2.5 MG/3ML) 0.083% IN NEBU
2.5000 mg | INHALATION_SOLUTION | Freq: Four times a day (QID) | RESPIRATORY_TRACT | 0 refills | Status: DC | PRN
  Filled 2021-09-11: qty 75, 7d supply, fill #0

## 2021-09-11 MED ORDER — ESOMEPRAZOLE MAGNESIUM 40 MG PO CPDR
40.0000 mg | DELAYED_RELEASE_CAPSULE | Freq: Every day | ORAL | 1 refills | Status: DC
  Filled 2021-09-11: qty 90, 90d supply, fill #0
  Filled 2022-02-23: qty 90, 90d supply, fill #1

## 2021-09-11 MED ORDER — BREZTRI AEROSPHERE 160-9-4.8 MCG/ACT IN AERO
160.0000 ug | INHALATION_SPRAY | Freq: Two times a day (BID) | RESPIRATORY_TRACT | 5 refills | Status: DC
  Filled 2021-09-11 – 2021-09-12 (×2): qty 5.9, 30d supply, fill #0

## 2021-09-11 MED FILL — Oxybutynin Chloride Tab ER 24HR 10 MG: ORAL | 30 days supply | Qty: 30 | Fill #1 | Status: AC

## 2021-09-11 MED FILL — Naproxen Tab 500 MG: ORAL | 45 days supply | Qty: 90 | Fill #0 | Status: AC

## 2021-09-11 NOTE — Telephone Encounter (Signed)
Both RX for [employer] and her Proventil sent in to pharmacy. Nothing further needed.

## 2021-09-12 ENCOUNTER — Other Ambulatory Visit (HOSPITAL_COMMUNITY): Payer: Self-pay

## 2021-09-13 ENCOUNTER — Telehealth: Payer: Self-pay | Admitting: Pulmonary Disease

## 2021-09-13 ENCOUNTER — Other Ambulatory Visit (HOSPITAL_COMMUNITY): Payer: Self-pay

## 2021-09-13 ENCOUNTER — Other Ambulatory Visit: Payer: Self-pay | Admitting: Pulmonary Disease

## 2021-09-13 ENCOUNTER — Telehealth: Payer: Self-pay | Admitting: Pharmacy Technician

## 2021-09-13 MED ORDER — BREZTRI AEROSPHERE 160-9-4.8 MCG/ACT IN AERO
160.0000 ug | INHALATION_SPRAY | Freq: Two times a day (BID) | RESPIRATORY_TRACT | 11 refills | Status: DC
  Filled 2021-09-13: qty 10.7, 30d supply, fill #0

## 2021-09-13 MED ORDER — BREZTRI AEROSPHERE 160-9-4.8 MCG/ACT IN AERO
2.0000 | INHALATION_SPRAY | Freq: Two times a day (BID) | RESPIRATORY_TRACT | 11 refills | Status: DC
  Filled 2021-09-13 – 2021-10-10 (×2): qty 10.7, 30d supply, fill #0
  Filled 2021-11-07: qty 10.7, 30d supply, fill #1
  Filled 2022-01-16: qty 10.7, 30d supply, fill #2
  Filled 2022-02-23: qty 10.7, 30d supply, fill #3
  Filled 2022-06-21 – 2022-08-17 (×2): qty 10.7, 30d supply, fill #4

## 2021-09-13 NOTE — Telephone Encounter (Signed)
All of my prescriptions are for the 10.7 g size.  The smaller sizes are sample sizes which she may have received during the visit.  We will send the prescription for the [facility].

## 2021-09-13 NOTE — Telephone Encounter (Signed)
Rx has been corrected.  Manuela Schwartz with [facility] pharmacy is aware and voiced her understanding.  Nothing further needed at this time.

## 2021-09-13 NOTE — Telephone Encounter (Signed)
Patient Advocate Encounter  Received notification through CoverMyMeds that Breztri required Prior Authorization.    Per Test Claim: Med is covered at $40 [place] [facility] to: Can you resend the rx for the 10.7g size? It has 120 puffs, which would be right for the 30 day supply. The smaller one isn't covered.

## 2021-09-13 NOTE — Progress Notes (Signed)
Prescription for [facility] sent.

## 2021-09-27 ENCOUNTER — Other Ambulatory Visit (HOSPITAL_COMMUNITY): Payer: Self-pay

## 2021-10-10 ENCOUNTER — Ambulatory Visit: Payer: 59 | Admitting: Pulmonary Disease

## 2021-10-10 ENCOUNTER — Telehealth: Payer: Self-pay

## 2021-10-10 ENCOUNTER — Encounter: Payer: Self-pay | Admitting: Pulmonary Disease

## 2021-10-10 ENCOUNTER — Other Ambulatory Visit: Payer: Self-pay

## 2021-10-10 ENCOUNTER — Other Ambulatory Visit (HOSPITAL_COMMUNITY): Payer: Self-pay

## 2021-10-10 VITALS — BP 132/70 | HR 71 | Temp 97.7°F | Ht 61.0 in | Wt 209.6 lb

## 2021-10-10 DIAGNOSIS — Z9989 Dependence on other enabling machines and devices: Secondary | ICD-10-CM | POA: Diagnosis not present

## 2021-10-10 DIAGNOSIS — J329 Chronic sinusitis, unspecified: Secondary | ICD-10-CM

## 2021-10-10 DIAGNOSIS — R0602 Shortness of breath: Secondary | ICD-10-CM

## 2021-10-10 DIAGNOSIS — J4540 Moderate persistent asthma, uncomplicated: Secondary | ICD-10-CM

## 2021-10-10 DIAGNOSIS — J310 Chronic rhinitis: Secondary | ICD-10-CM | POA: Diagnosis not present

## 2021-10-10 DIAGNOSIS — G4733 Obstructive sleep apnea (adult) (pediatric): Secondary | ICD-10-CM | POA: Diagnosis not present

## 2021-10-10 MED ORDER — BREZTRI AEROSPHERE 160-9-4.8 MCG/ACT IN AERO: 2.0000 | INHALATION_SPRAY | Freq: Two times a day (BID) | RESPIRATORY_TRACT | 0 refills | Status: DC
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
## 2021-10-10 NOTE — Patient Instructions (Signed)
We are starting you on a shot called Dupixent to see if we can control your asthma better.  Your compliance with CPAP is falling a little short try to use it more.  We got the [place] issue sorted out.  The pharmacy will have it available for you tomorrow however we have provided you a sample to help you for the next few days.  We are also providing you with a new co-pay card which can be activated for your [place].  We will see him in follow-up in 4 to 6 weeks time call sooner should any new problems arise.

## 2021-10-10 NOTE — Telephone Encounter (Signed)
Dupixent enrollment forms have been faxed to [place] team.

## 2021-10-11 ENCOUNTER — Other Ambulatory Visit (HOSPITAL_COMMUNITY): Payer: Self-pay

## 2021-10-11 NOTE — Telephone Encounter (Signed)
Received New start paperwork for [name]. Will update as we work through the benefits process.  Submitted a Prior Authorization request to [facility] for DUPIXENT via CoverMyMeds. Will update once we receive a response.   Key: [id number]

## 2021-10-16 ENCOUNTER — Other Ambulatory Visit (HOSPITAL_COMMUNITY): Payer: Self-pay

## 2021-10-16 NOTE — Telephone Encounter (Addendum)
Received notification from [facility] regarding a prior authorization for [place] under two separate authorizations. Approval letter sent to scan center.  Loading dose has been APPROVED from 10/13/2021 to 11/12/2021 under Auth# [id number]. This auth allows 50ml to be run as a 28 day supply.   Maintenance dose has been APPROVED for three fills of 46ml as a 28-day supply from 11/19/2021 to 02/16/2022 under Auth# [id number].   Per test claim, copay for 28-day supply is $1,000 regardless of dispense quantity.  Patient can fill through [place]: [phone]   Loading Dose Authorization # [id number] Maintenance Authorization # [id number]

## 2021-10-17 ENCOUNTER — Other Ambulatory Visit (HOSPITAL_COMMUNITY): Payer: Self-pay

## 2021-10-18 DIAGNOSIS — G4733 Obstructive sleep apnea (adult) (pediatric): Secondary | ICD-10-CM | POA: Diagnosis not present

## 2021-10-18 DIAGNOSIS — J45901 Unspecified asthma with (acute) exacerbation: Secondary | ICD-10-CM | POA: Diagnosis not present

## 2021-10-31 ENCOUNTER — Other Ambulatory Visit (HOSPITAL_COMMUNITY): Payer: Self-pay

## 2021-10-31 NOTE — Telephone Encounter (Addendum)
Called and activated copay card for patient. Information has been added to [facility] and card will be scanned in to chart for retention.  Card info: RxBIN: [id number] RxPCN: [id number] RxGRP: [id number] ID: [id number]  New copay is $0. Pt is [name] employee and will need to have Rx rewritten by [facility] under Dr. Lenna Sciara.

## 2021-11-02 ENCOUNTER — Other Ambulatory Visit (HOSPITAL_COMMUNITY): Payer: Self-pay

## 2021-11-02 ENCOUNTER — Telehealth: Payer: Self-pay | Admitting: Pharmacist

## 2021-11-02 MED ORDER — DUPIXENT 300 MG/2ML ~~LOC~~ SOAJ
SUBCUTANEOUS | 0 refills | Status: DC
  Filled 2021-11-02: qty 8, fill #0
  Filled 2021-11-02: qty 8, 14d supply, fill #0

## 2021-11-02 NOTE — Telephone Encounter (Signed)
Rx for Dupixent sent to [facility] to be couriered to clinic.  Dupixent new start scheduled for 1/10//23. Patient provided with [facility] location address.  Knox Saliva, PharmD, MPH, BCPS Clinical Pharmacist (Rheumatology and Pulmonology)

## 2021-11-02 NOTE — Telephone Encounter (Signed)
Called patient to schedule an appointment for the [employer] [facility]. I was unable to reach the patient so I left a HIPAA-compliant message requesting that the patient return my call.   Luke Van Ausdall, PharmD, BCACP, CPP Clinical Pharmacist [facility] [phone]  

## 2021-11-03 ENCOUNTER — Other Ambulatory Visit (HOSPITAL_COMMUNITY): Payer: Self-pay

## 2021-11-06 ENCOUNTER — Other Ambulatory Visit (HOSPITAL_COMMUNITY): Payer: Self-pay

## 2021-11-07 ENCOUNTER — Other Ambulatory Visit: Payer: Self-pay | Admitting: Pulmonary Disease

## 2021-11-07 ENCOUNTER — Ambulatory Visit: Payer: 59 | Attending: Family Medicine | Admitting: Pharmacist

## 2021-11-07 ENCOUNTER — Other Ambulatory Visit (HOSPITAL_COMMUNITY): Payer: Self-pay

## 2021-11-07 ENCOUNTER — Other Ambulatory Visit: Payer: Self-pay

## 2021-11-07 ENCOUNTER — Ambulatory Visit (INDEPENDENT_AMBULATORY_CARE_PROVIDER_SITE_OTHER): Payer: 59 | Admitting: Pharmacist

## 2021-11-07 ENCOUNTER — Other Ambulatory Visit: Payer: Self-pay | Admitting: Family Medicine

## 2021-11-07 DIAGNOSIS — Z7189 Other specified counseling: Secondary | ICD-10-CM | POA: Diagnosis not present

## 2021-11-07 DIAGNOSIS — J4540 Moderate persistent asthma, uncomplicated: Secondary | ICD-10-CM

## 2021-11-07 DIAGNOSIS — Z79899 Other long term (current) drug therapy: Secondary | ICD-10-CM

## 2021-11-07 MED ORDER — ALBUTEROL SULFATE (2.5 MG/3ML) 0.083% IN NEBU
2.5000 mg | INHALATION_SOLUTION | Freq: Four times a day (QID) | RESPIRATORY_TRACT | 0 refills | Status: DC | PRN
  Filled 2021-11-07: qty 75, 7d supply, fill #0

## 2021-11-07 MED ORDER — DUPIXENT 300 MG/2ML ~~LOC~~ SOAJ
300.0000 mg | SUBCUTANEOUS | 1 refills | Status: DC
  Filled 2021-11-07: qty 12, 84d supply, fill #0

## 2021-11-07 MED ORDER — DUPIXENT 300 MG/2ML ~~LOC~~ SOAJ
300.0000 mg | SUBCUTANEOUS | 0 refills | Status: DC
  Filled 2021-11-07: qty 8, 56d supply, fill #0

## 2021-11-07 MED ORDER — DUPIXENT 300 MG/2ML ~~LOC~~ SOAJ
300.0000 mg | SUBCUTANEOUS | 1 refills | Status: DC
  Filled 2021-11-07: qty 12, 84d supply, fill #0
  Filled 2021-11-07: qty 4, 28d supply, fill #0
  Filled 2021-11-29: qty 4, 28d supply, fill #1
  Filled 2021-12-25: qty 4, 28d supply, fill #2
  Filled 2022-01-24: qty 4, 28d supply, fill #3
  Filled 2022-02-16: qty 4, 28d supply, fill #4
  Filled 2022-03-14: qty 4, 28d supply, fill #5

## 2021-11-07 MED FILL — Fluticasone Propionate Nasal Susp 50 MCG/ACT: NASAL | 30 days supply | Qty: 16 | Fill #1 | Status: AC

## 2021-11-07 NOTE — Telephone Encounter (Signed)
At this time, pt has not returned Luke's calls to get her Rx re-written to be 340b compliant. Will need to use sample at today's new start visit. Sufficient quantity already confirmed with [facility].

## 2021-11-07 NOTE — Progress Notes (Signed)
° °  S: Patient presents for review of their specialty medication therapy.  Patient is currently taking Dupixent for asthma. Patient is managed by Dr. Patsey Berthold for this.   Adherence: received her 500 mg LD today  Efficacy: received her 500 mg LD today  Dosing: 600 mg x1 followed by 300 mg q14days  Dose adjustments: Renal: no dose adjustments (has not been studied) Hepatic: no dose adjustments (has not been studied)  Drug-drug interactions: none noted   Monitoring: S/sx of infection: none S/sx of hypersensitivity: none S/sx of ocular effects: none S/sx of eosinophilia/vasculitis: none  O:     Lab Results  Component Value Date   WBC 6.5 07/19/2020   HGB 12.3 07/19/2020   HCT 36.8 07/19/2020   MCV 93.2 07/19/2020   PLT 264 07/19/2020      Chemistry      Component Value Date/Time   NA 141 08/16/2021 1540   K 3.8 08/16/2021 1540   CL 100 08/16/2021 1540   CO2 25 08/16/2021 1540   BUN 25 (H) 08/16/2021 1540   CREATININE 1.05 (H) 08/16/2021 1540   CREATININE 0.82 09/05/2016 1653      Component Value Date/Time   CALCIUM 9.8 08/16/2021 1540   ALKPHOS 57 02/07/2021 1624   AST 14 02/07/2021 1624   ALT 19 02/07/2021 1624   BILITOT <0.2 02/07/2021 1624       A/P: 1. Medication review: Patient currently on [place] for asthma. Reviewed the medication with the patient, including the following: Dupixent is a monoclonal antibody used for the treatment of asthma or atopic dermatitis. Patient educated on purpose, proper use and potential adverse effects of Dupixent. Possible adverse effects include increased risk of infection, ocular effects, vasculitis/eosinophilia, and hypersensitivity reactions. Administer as a SubQ injection and rotate sites. Allow the medication to reach room temp prior to administration (45 mins for 300 mg syringe or 30 min for 200 mg syringe). Do not shake. Discard any unused portion. No recommendations for any changes.   Benard Halsted, PharmD, [place],  [place] [phone]

## 2021-11-07 NOTE — Progress Notes (Signed)
HPI Patient presents today to [facility] to see pharmacy team for [place] [facility].  Past medical history includes: moderate persistent asthma, OSA (on CPAP), HTN, and chronic migraine without aura  Respiratory Medications Current regimen: Breztri 160/9/4.8 mcg (2 puffs twice daily), cetirizine 10mg  daily,  Patient reports no known adherence challenges  OBJECTIVE Allergies  Allergen Reactions   Amitriptyline     Abnormal behavior. Just doesn't feel like normal self    Fluticasone-Salmeterol Other (See Comments)    Thrush even with mouth rinsing; worsens cough   Imitrex [Sumatriptan]     Made her head feel like its on fire   Penicillins Itching and Swelling    Has patient had a PCN reaction causing immediate rash, facial/tongue/throat swelling, SOB or lightheadedness with hypotension: No Has patient had a PCN reaction causing severe rash involving mucus membranes or skin necrosis: No Has patient had a PCN reaction that required hospitalization: No Has patient had a PCN reaction occurring within the last 10 years: Yes If all of the above answers are "NO", then may proceed with Cephalosporin use.    Ciprofloxacin Itching and Rash   Macrobid [Nitrofurantoin Monohyd Macro] Itching   Sulfamethoxazole-Trimethoprim Rash    Outpatient Encounter Medications as of 11/07/2021  Medication Sig   albuterol (PROVENTIL) (2.5 MG/3ML) 0.083% nebulizer solution Take 3 mLs (2.5 mg total) by nebulization every 6 (six) hours as needed for wheezing or shortness of breath.   albuterol (VENTOLIN HFA) 108 (90 Base) MCG/ACT inhaler Inhale 2 puffs into the lungs every 6 (six) hours as needed for wheezing or shortness of breath.   azelastine (ASTELIN) 0.1 % nasal spray Place 1 spray into both nostrils 2 (two) times daily. Use in each nostril as directed   benzonatate (TESSALON) 100 MG capsule Take 1 capsule (100 mg total) by mouth 2 (two) times daily as needed for cough.   BIOTIN PO Take 2,000 mg by  mouth daily.   Budeson-Glycopyrrol-Formoterol (BREZTRI AEROSPHERE) 160-9-4.8 MCG/ACT AERO Inhale 2 puffs into the lungs in the morning and at bedtime.   Budeson-Glycopyrrol-Formoterol (BREZTRI AEROSPHERE) 160-9-4.8 MCG/ACT AERO Inhale 2 puffs into the lungs in the morning and at bedtime.   cetirizine (ZYRTEC) 10 MG tablet Take 1 tablet (10 mg total) by mouth daily.   Dupilumab (DUPIXENT) 300 MG/2ML SOPN Inject 600mg  into the skin at Week 0, then 300mg  into the skin every 14 days thereafter. Courier to pulm: [address], [address], [place] [address] (appt on 11/07/20)   Elastic Bandages & Supports (SHOULDER BRACE LARGE) MISC 1 each by Does not apply route as needed.   esomeprazole (NEXIUM) 40 MG capsule Take 1 capsule (40 mg total) by mouth daily.   fluorometholone (FML) 0.1 % ophthalmic suspension Place 1 drop into both eyes in the morning and at bedtime.   fluticasone (FLONASE) 50 MCG/ACT nasal spray PLACE 2 SPRAYS INTO THE NOSE DAILY. (Patient taking differently: Place 2 sprays into both nostrils daily.)   Fremanezumab-vfrm (AJOVY) 225 MG/1.5ML SOAJ Inject 225 mg into the skin every 30 (thirty) days.   gabapentin (NEURONTIN) 300 MG capsule Take 1 capsule (300 mg total) by mouth every 8 (eight) hours as needed for pain   losartan-hydrochlorothiazide (HYZAAR) 100-25 MG tablet Take 1 tablet by mouth daily.   Multiple Vitamin (MULTIVITAMIN WITH MINERALS) TABS tablet Take 1 tablet by mouth daily.   naproxen (NAPROSYN) 500 MG tablet Take 1 tablet (500 mg total) by mouth 2 (two) times daily as needed for mild or  moderate pain   ondansetron (ZOFRAN-ODT) 4 MG disintegrating tablet TAKE 1 TABLET (4 MG TOTAL) BY MOUTH EVERY 8 (EIGHT) HOURS AS NEEDED FOR UP TO 15 DOSES FOR NAUSEA OR VOMITING. (Patient taking differently: Take 4 mg by mouth every 8 (eight) hours as needed for nausea or vomiting.)   oxybutynin (DITROPAN-XL) 10 MG 24 hr tablet TAKE 1 TABLET (10 MG TOTAL) BY MOUTH AT BEDTIME. (Patient taking  differently: Take 10 mg by mouth at bedtime.)   Polyethyl Glycol-Propyl Glycol (SYSTANE OP) Place 1 drop into both eyes in the morning and at bedtime.   Pyridoxine HCl (VITAMIN B-6 PO) Take 1 tablet by mouth daily. Unknown OTC strength   rosuvastatin (CRESTOR) 20 MG tablet Take 1 tablet (20 mg total) by mouth daily.   sertraline (ZOLOFT) 50 MG tablet Take 1 tablet (50 mg total) by mouth daily.   Spacer/Aero-Holding Chambers (AEROCHAMBER MV) inhaler Use as instructed   vitamin C (ASCORBIC ACID) 500 MG tablet Take 500 mg by mouth daily.    Facility-Administered Encounter Medications as of 11/07/2021  Medication   0.9 %  sodium chloride infusion    Immunization History  Administered Date(s) Administered   Influenza Split 07/22/2016   Influenza Whole 07/28/2008   Influenza,inj,Quad PF,6+ Mos 07/29/2013, 07/13/2019   Influenza-Unspecified 08/12/2014, 07/29/2015, 07/28/2017, 07/29/2020, 07/26/2021   Moderna Sars-Covid-2 Vaccination 10/21/2019, 11/09/2019   PFIZER Comirnaty(Gray Top)Covid-19 Tri-Sucrose Vaccine 07/19/2020   Pneumococcal Polysaccharide-23 08/01/2016   Td 05/29/2002   Tdap 09/05/2016   PFTs PFT Results Latest Ref Rng & Units 06/02/2015  FVC-Pre L 2.05  FVC-Predicted Pre % 75  FVC-Post L 1.99  FVC-Predicted Post % 73  Pre FEV1/FVC % % 93  Post FEV1/FCV % % 94  FEV1-Pre L 1.90  FEV1-Predicted Pre % 87  FEV1-Post L 1.86  DLCO uncorrected ml/min/mmHg 15.32  DLCO UNC% % 71  DLVA Predicted % 105  TLC L 3.59  TLC % Predicted % 75  RV % Predicted % 75    Eosinophils Most recent blood eosinophil count was 100 cells/microL taken on 07/19/20  IgE: 07/19/20 - 50  Assessment   Biologics training for dupilumab (Dupixent)  Goals of therapy: Mechanism: human monoclonal IgG4 antibody that inhibits interleukin-4 and interleukin-13 cytokine-induced responses, including release of proinflammatory cytokines, chemokines, and IgE Reviewed that Dupixent is add-on medication and  patient must continue maintenance inhaler regimen. Response to therapy: may take 4 months to determine efficacy. Discussed that patients generally feel improvement sooner than 4 months.  Side effects: injection site reaction (6-18%), antibody development (5-16%), ophthalmic conjunctivitis (2-16%), transient blood eosinophilia (1-2%)  Dose: 600mg  at Week 0 (administered today in clinic) followed by 300mg  every 14 days thereafter  Administration/Storage:  Reviewed administration sites of thigh or abdomen (at least 2-3 inches away from abdomen). Reviewed the upper arm is only appropriate if caregiver is administering injection  Do not shake pen/syringe as this could lead to product foaming or precipitation. Do not use if solution is discolored or contains particulate matter or if window on prefilled pen is yellow (indicates pen has been used).  Reviewed storage of medication in refrigerator. Reviewed that [place] can be stored at room temperature in unopened carton for up to 14 days.  Access: Approval of Dupixent through: insurance Patient enrolled into copay card program to help with copay assistance.  Medication Reconciliation  A drug regimen assessment was performed, including review of allergies, interactions, disease-state management, dosing and immunization history. Medications were reviewed with the patient, including name, instructions, indication,  goals of therapy, potential side effects, importance of adherence, and safe use.  Drug interaction(s): none noted   Immunizations  Patient is indicated for the influenzae, pneumonia, and shingles vaccinations. Patient has received 3 COVID19 vaccines. She is UTD on influenza and pneumonia vaccine  PLAN Continue Dupixent 300 mg every 14 days.  Next dose is due 11/21/21 and every 14 days thereafter.  Rx sent to: [facility]: [phone] .  Patient provided with pharmacy phone number and advised that pharmacy  will call later this week to schedule shipment to home. Patient provided with copay card information to provide to pharmacy if quoted copay exceeds $5 per month. Continue maintenance asthma regimen of: Breztri 160/9/4.8 mcg (2 puffs twice daily), cetirizine 10mg  daily  All questions encouraged and answered.  Instructed patient to reach out with any further questions or concerns.  Thank you for allowing pharmacy to participate in this patient's care.  This appointment required 60 minutes of patient care (this includes precharting, chart review, review of results, face-to-face care, etc.).   Knox Saliva, PharmD, MPH, BCPS Clinical Pharmacist (Rheumatology and Pulmonology)

## 2021-11-07 NOTE — Patient Instructions (Signed)
Your next Dupixent dose is due on 11/21/21, 12/05/21, and every 14 days thereafter  [place] prescription will be shipped from [facility]. Their phone number is [phone] Please call to schedule shipment and confirm address. They will mail your medication to your home.  Your copay should be affordable. If you call the pharmacy and it is not affordable, please double-check that they are billing through your copay card as secondary coverage. That copay card information is: RxBIN: [id number] RxPCN: [id number] RxGRP: [id number] ID: [id number]  You will need to be seen by your provider in 3 to 4 months to assess how [facility] is working for you. Please ensure you have a follow-up appointment scheduled in April or May 2023. Call our clinic if you need to make this appointment.  How to manage an injection site reaction: Remember the 5 C's: COUNTER - leave on the counter at least 30 minutes but up to overnight to bring medication to room temperature. This may help prevent stinging COLD - place something cold (like an ice gel pack or cold water bottle) on the injection site just before cleansing with alcohol. This may help reduce pain CLARITIN - use Claritin (generic name is loratadine) for the first two weeks of treatment or the day of, the day before, and the day after injecting. This will help to minimize injection site reactions CORTISONE CREAM - apply if injection site is irritated and itching CALL ME - if injection site reaction is bigger than the size of your fist, looks infected, blisters, or if you develop hives

## 2021-11-08 ENCOUNTER — Other Ambulatory Visit (HOSPITAL_COMMUNITY): Payer: Self-pay

## 2021-11-08 MED ORDER — NAPROXEN 500 MG PO TABS
500.0000 mg | ORAL_TABLET | Freq: Two times a day (BID) | ORAL | 1 refills | Status: DC | PRN
  Filled 2021-11-08: qty 90, 45d supply, fill #0
  Filled 2022-01-16: qty 90, 45d supply, fill #1

## 2021-11-08 MED ORDER — OXYBUTYNIN CHLORIDE ER 10 MG PO TB24
10.0000 mg | ORAL_TABLET | Freq: Every day | ORAL | 2 refills | Status: DC
  Filled 2021-11-08: qty 30, 30d supply, fill #0

## 2021-11-09 ENCOUNTER — Other Ambulatory Visit (HOSPITAL_COMMUNITY): Payer: Self-pay

## 2021-11-09 NOTE — Progress Notes (Signed)
SUBJECTIVE:   Chief compliant/HPI: annual examination  [name] is a 56 y.o. who presents today for an annual exam.   HTN: current regimen includes losartan-HCTZ 100-25mg  . BP not at goal today: 157/67. 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   HLD: The 10-year ASCVD risk score (Arnett DK, et al., 2019) is: 22.5%   Values used to calculate the score:     Age: 17 years     Sex: Female     Is Non-Hispanic African American: Yes     Diabetic: Yes     Tobacco smoker: No     Systolic Blood Pressure: 623 mmHg     Is BP treated: Yes     HDL Cholesterol: 57 mg/dL     Total Cholesterol: 214 mg/dL Patient had been prescribed crestor 20 mg, but reports she stopped taking it. She did not have myalgias.  History tabs reviewed and updated.   Review of systems form reviewed and notable for fatigue, weight gain.   OBJECTIVE:   BP (!) 157/67    Pulse 78    Wt 216 lb 6.4 oz (98.2 kg)    LMP 05/21/2017 (Exact Date)    SpO2 97%    BMI 40.89 kg/m   Nursing note and vitals reviewed GEN: age-appropriate AAW, resting comfortably in chair, NAD, class III obesity HEENT: NCAT. PERRLA. Sclera without injection or icterus. MMM. Clear oropharynx. Neck: Supple. No LAD Cardiac: Regular rate and rhythm. Normal S1/S2. No murmurs, rubs, or gallops appreciated. 2+ radial pulses. Lungs: Clear bilaterally to ascultation. No increased WOB, no accessory muscle usage. No w/r/r. Neuro: AOx3  Ext: no edema Psych: Pleasant and appropriate   ASSESSMENT/PLAN:   Pre-diabetes Patient with elevated A1c to 6.9%, very high risk for converting to DM2 if has not already. Recommend rechecking A1c very closely in 3 months. Counseled extensively on lifestyle changes to prevent DM2. Recommend starting metformin 500 mg qAM to help prevent worsening of DM.   Hypercholesterolemia Patient did not continue crestor. Lipid panel obtained in August 22 shows  elevated LDL to , total cholesterol 214. ASCVD risk 20% with likely DM2, recommend high intensity statin, counseled extensively. Patient is hesitant about starting new medications. Recommend starting crestor 5 mg and increasing every 2 weeks until titrate to 20 mg as a means of shared decision making.  HTN (hypertension) BP not at goal, but patient reports checking BP 4-5 days a week and it is near or at goal. Recommend with shared decision making that patient take her BP at the same time every day when she is most relaxed and log it for a week, then return for follow up.    Annual Examination  See AVS for age appropriate recommendations  PHQ score 0, reviewed and discussed.  BP reviewed and not at goal, see problem of HTN Asked about intimate partner violence and resources given as appropriate   Considered the following items based upon USPSTF recommendations: Diabetes screening: discussed and ordered Screening for elevated cholesterol: discussed and ordered HIV testing:  NR, low risk not ordered Hepatitis C:  NR, low risk, not ordered Hepatitis B: discussed and low risk Syphilis if at high risk:  not at high risk not ordered GC/CT not at high risk and not ordered. Osteoporosis screening considered based upon risk of fracture from [facility] calculator. Not age appropriate. DEXA not ordered.  Reviewed risk factors for latent  tuberculosis and not indicated  Mammogram UTD on 05/12/21, repeat this year. Cervical cancer screening: prior Pap reviewed, repeat due in July 2023 Colorectal cancer screening: up to date on screening for CRC. Last done in June 2019, recommended to repeat in 10 years. Lung cancer screening:  n/a . See documentation below regarding indications/risks/benefits.  Vaccinations: counseled on COVID 4th shot and zoster.   Follow up in  2 weeks   Gladys Damme, MD [facility]

## 2021-11-10 ENCOUNTER — Other Ambulatory Visit: Payer: Self-pay

## 2021-11-10 ENCOUNTER — Ambulatory Visit: Payer: 59 | Admitting: Family Medicine

## 2021-11-10 ENCOUNTER — Other Ambulatory Visit (HOSPITAL_COMMUNITY): Payer: Self-pay

## 2021-11-10 VITALS — BP 157/67 | HR 78 | Wt 216.4 lb

## 2021-11-10 DIAGNOSIS — E1165 Type 2 diabetes mellitus with hyperglycemia: Secondary | ICD-10-CM | POA: Diagnosis not present

## 2021-11-10 DIAGNOSIS — I10 Essential (primary) hypertension: Secondary | ICD-10-CM

## 2021-11-10 DIAGNOSIS — E7800 Pure hypercholesterolemia, unspecified: Secondary | ICD-10-CM

## 2021-11-10 DIAGNOSIS — R7303 Prediabetes: Secondary | ICD-10-CM

## 2021-11-10 LAB — POCT GLYCOSYLATED HEMOGLOBIN (HGB A1C): HbA1c, POC (controlled diabetic range): 6.9 % (ref 0.0–7.0)

## 2021-11-10 MED ORDER — FREESTYLE LANCETS MISC
0 refills | Status: DC
  Filled 2021-11-10: qty 100, 50d supply, fill #0

## 2021-11-10 MED ORDER — ROSUVASTATIN CALCIUM 5 MG PO TABS
5.0000 mg | ORAL_TABLET | Freq: Every day | ORAL | 1 refills | Status: DC
  Filled 2021-11-10: qty 30, 30d supply, fill #0

## 2021-11-10 MED ORDER — GLUCOSE BLOOD VI STRP
ORAL_STRIP | 0 refills | Status: DC
  Filled 2021-11-10: qty 100, 50d supply, fill #0

## 2021-11-10 MED ORDER — BLOOD GLUCOSE MONITOR KIT
PACK | 0 refills | Status: AC
  Filled 2021-11-10: qty 1, 30d supply, fill #0

## 2021-11-10 MED ORDER — METFORMIN HCL ER 500 MG PO TB24
500.0000 mg | ORAL_TABLET | Freq: Every day | ORAL | 3 refills | Status: DC
  Filled 2021-11-10: qty 90, 90d supply, fill #0

## 2021-11-10 NOTE — Patient Instructions (Signed)
It was a pleasure to see you today!  You have diabetes: for this you will start metformin 500 mg, take once in the morning with breakfast Also start taking crestor 5 mg. If any problem with any of these medications, please let me know Follow up on 11/27/21 at 3:30 PM    Be Well,  Dr. Chauncey Reading

## 2021-11-12 NOTE — Assessment & Plan Note (Signed)
BP not at goal, but patient reports checking BP 4-5 days a week and it is near or at goal. Recommend with shared decision making that patient take her BP at the same time every day when she is most relaxed and log it for a week, then return for follow up.

## 2021-11-12 NOTE — Assessment & Plan Note (Signed)
Patient did not continue crestor. Lipid panel obtained in August 22 shows elevated LDL to , total cholesterol 214. ASCVD risk 20% with likely DM2, recommend high intensity statin, counseled extensively. Patient is hesitant about starting new medications. Recommend starting crestor 5 mg and increasing every 2 weeks until titrate to 20 mg as a means of shared decision making.

## 2021-11-12 NOTE — Assessment & Plan Note (Signed)
Patient with elevated A1c to 6.9%, very high risk for converting to DM2 if has not already. Recommend rechecking A1c very closely in 3 months. Counseled extensively on lifestyle changes to prevent DM2. Recommend starting metformin 500 mg qAM to help prevent worsening of DM.

## 2021-11-14 ENCOUNTER — Telehealth: Payer: Self-pay

## 2021-11-14 ENCOUNTER — Other Ambulatory Visit (HOSPITAL_COMMUNITY): Payer: Self-pay

## 2021-11-14 MED ORDER — CEPHALEXIN 500 MG PO CAPS
500.0000 mg | ORAL_CAPSULE | Freq: Four times a day (QID) | ORAL | 0 refills | Status: DC
  Filled 2021-11-14: qty 28, 7d supply, fill #0

## 2021-11-14 NOTE — Telephone Encounter (Signed)
Patient calls nurse line regarding UTI symptoms. Patient states she is having lower back pain, malodorous urine and painful urination. 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 Instructions are to take 1 capsule by mouth four times daily.  Patient wanted to let provider know and to update her chart. Advised patient of return precautions. Patient verbalizes understanding.   Talbot Grumbling, RN

## 2021-11-15 ENCOUNTER — Telehealth: Payer: Self-pay | Admitting: Pulmonary Disease

## 2021-11-15 ENCOUNTER — Encounter: Payer: Self-pay | Admitting: Pulmonary Disease

## 2021-11-15 NOTE — Telephone Encounter (Signed)
Spoke to patient and requested that she bring SD card to 11/16/2021 visit.

## 2021-11-16 ENCOUNTER — Encounter: Payer: Self-pay | Admitting: Pulmonary Disease

## 2021-11-16 ENCOUNTER — Ambulatory Visit: Payer: 59 | Admitting: Pulmonary Disease

## 2021-11-16 ENCOUNTER — Other Ambulatory Visit: Payer: Self-pay

## 2021-11-16 VITALS — BP 122/82 | HR 78 | Temp 98.1°F | Ht 61.0 in | Wt 211.6 lb

## 2021-11-16 DIAGNOSIS — Z8616 Personal history of COVID-19: Secondary | ICD-10-CM | POA: Diagnosis not present

## 2021-11-16 DIAGNOSIS — Z9989 Dependence on other enabling machines and devices: Secondary | ICD-10-CM | POA: Diagnosis not present

## 2021-11-16 DIAGNOSIS — R0602 Shortness of breath: Secondary | ICD-10-CM

## 2021-11-16 DIAGNOSIS — J4550 Severe persistent asthma, uncomplicated: Secondary | ICD-10-CM

## 2021-11-16 DIAGNOSIS — G4733 Obstructive sleep apnea (adult) (pediatric): Secondary | ICD-10-CM | POA: Diagnosis not present
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
## 2021-11-16 NOTE — Patient Instructions (Signed)
Continue using your Breztri.  Continue using the [place].  We have referred you to pulmonary rehab.  We have provided you with a handicap placard application.  We will see you in follow-up in 2 months time call sooner should any new problems arise.

## 2021-11-20 ENCOUNTER — Telehealth: Payer: Self-pay | Admitting: *Deleted

## 2021-11-20 NOTE — Telephone Encounter (Signed)
[name] called nurse phone and states she is trying to make appointment with Dr. Harolyn Rutherford, states she hasn't been able to get through. I explained I cannot make appointments for provider. I explained I cannot transfer to registrar. I gave her number to call tomorrow morning as phones are off now for registrar.  She states she has been trying for a while. I informed her I will send a message to registrar for them to call her. Duong Haydel,RN

## 2021-11-21 ENCOUNTER — Telehealth: Payer: Self-pay | Admitting: Family Medicine

## 2021-11-21 NOTE — Telephone Encounter (Signed)
Called patient again to schedule appointment, there was no answer to the phone call so a voicemail was left.

## 2021-11-22 ENCOUNTER — Telehealth: Payer: Self-pay

## 2021-11-22 NOTE — Telephone Encounter (Signed)
Patient should discontinue medication. For other option to treat DM2, recommend that she come in for re-evaluation. Patient currently has appt scheduled for 11/27/21. That should be appropriate unless she continues to have diarrhea, unable to eat or drink, or has other problems and cannot wait till next Tuesday. Please inform her of above.  Gladys Damme, MD [facility], PGY-3

## 2021-11-22 NOTE — Telephone Encounter (Signed)
Patient calls nurse line regarding GI upset with metformin. Patient reports nausea and diarrhea. Reports that she has been having these symptoms for about one week.   Patient is requesting alternative medication for metformin.   Please advise.   Talbot Grumbling, RN

## 2021-11-24 ENCOUNTER — Encounter (HOSPITAL_COMMUNITY): Payer: Self-pay | Admitting: *Deleted

## 2021-11-24 NOTE — Progress Notes (Signed)
Received referral from Dr. Camillo Flaming for this pt to participate in pulmonary rehab with the diagnosis of Severe Persistent Asthma. Clinical review of pt follow up appt on 11/16/21 Pulmonary office note as well as PCP.  Pt with Covid Risk Score - 4. Pt appropriate for scheduling for Pulmonary rehab.  Will forward to support staff for scheduling . Cherre Huger, BSN Cardiac and Training and development officer

## 2021-11-24 NOTE — Telephone Encounter (Signed)
Spoke with patient informed of note below. Patient understood, and said that she will be in Monday for her appt  at 3:30. Salvatore Marvel, CMA

## 2021-11-27 ENCOUNTER — Ambulatory Visit: Payer: 59 | Admitting: Family Medicine

## 2021-11-27 ENCOUNTER — Other Ambulatory Visit: Payer: Self-pay

## 2021-11-27 ENCOUNTER — Other Ambulatory Visit (HOSPITAL_COMMUNITY): Payer: Self-pay

## 2021-11-27 VITALS — BP 112/62 | HR 84 | Ht 61.0 in | Wt 210.2 lb

## 2021-11-27 DIAGNOSIS — N958 Other specified menopausal and perimenopausal disorders: Secondary | ICD-10-CM | POA: Diagnosis not present

## 2021-11-27 DIAGNOSIS — E1165 Type 2 diabetes mellitus with hyperglycemia: Secondary | ICD-10-CM | POA: Diagnosis not present

## 2021-11-27 DIAGNOSIS — N951 Menopausal and female climacteric states: Secondary | ICD-10-CM

## 2021-11-27 DIAGNOSIS — I10 Essential (primary) hypertension: Secondary | ICD-10-CM

## 2021-11-27 DIAGNOSIS — E1169 Type 2 diabetes mellitus with other specified complication: Secondary | ICD-10-CM | POA: Diagnosis not present

## 2021-11-27 DIAGNOSIS — E785 Hyperlipidemia, unspecified: Secondary | ICD-10-CM | POA: Diagnosis not present

## 2021-11-27 DIAGNOSIS — Z1329 Encounter for screening for other suspected endocrine disorder: Secondary | ICD-10-CM

## 2021-11-27 DIAGNOSIS — E1140 Type 2 diabetes mellitus with diabetic neuropathy, unspecified: Secondary | ICD-10-CM

## 2021-11-27 MED ORDER — EMPAGLIFLOZIN 10 MG PO TABS
10.0000 mg | ORAL_TABLET | Freq: Every day | ORAL | 3 refills | Status: DC
  Filled 2021-11-27: qty 90, 90d supply, fill #0

## 2021-11-27 MED ORDER — ESTROGENS CONJUGATED 0.625 MG/GM VA CREA
1.0000 | TOPICAL_CREAM | Freq: Every day | VAGINAL | 12 refills | Status: DC
Start: 2021-11-27 — End: 2022-02-23
  Filled 2021-11-27: qty 30, 15d supply, fill #0
  Filled 2022-01-16: qty 30, 15d supply, fill #1

## 2021-11-27 NOTE — Assessment & Plan Note (Addendum)
ASCVD risk 7.5% in next 10 years. Recommend starting statin, but patient is sensitive to many medications, she would like to reduce amount of medications starting today. Recommend starting at next visit in 4 weeks.

## 2021-11-27 NOTE — Assessment & Plan Note (Signed)
Hypertension: chronic, well controlled today - Medications: losartan-hctz 100-25 mg qd - Compliance: good - Checking BP at home: yes - Denies any SOB, CP, vision changes, LE edema, medication SEs, or symptoms of hypotension - Diet: SAD - Exercise: yes walking

## 2021-11-27 NOTE — Assessment & Plan Note (Signed)
New dx, does not tolerate metformin Current Regimen: start jardiance 10 mg qd CBGs: reported 100-150 while on metformin  Last A1c: 6.9% on 11/10/21 Denies polyuria, polydipsia, hypoglycemia  Last Eye Exam: reminded to do annually Statin: not yet- will start at next visit ACE/ARB: on losartan Check BMP due to diarrhea

## 2021-11-27 NOTE — Patient Instructions (Addendum)
It was a pleasure to see you today!  We will get some labs today.  If they are abnormal or we need to do something about them, I will call you.  If they are normal, I will send you a message on MyChart (if it is active) or a letter in the mail.  If you don't hear from [place] in 2 weeks, please call the office  [phone]. For your diabetes, keep up the good work! If you are ever interested, I can send you to a nutritionist as well. Try jardiance 10 mg once a day for your diabetes. This is good for protecting your heart, your kidneys, and it lowers A1c. The most common side effect is a vaginal yeast infection or urinary tract infection. This typically only happens in people who have A1c that is above 9 or 10 (yours is 6.9%). Let me know if you have any problems- you can send a MyChart message. Follow up in 4 weeks to check in on diabetes and hot flashes For your hot flashes: I suspect this is due to menopause, but I am check labs to be sure. I recommend you try black cohosh or evening primrose for hot flashes (you can get this at any health food store). You can also use the vaginal premarin cream for dryness and this may help with hot flashes. If no improvement, I recommend we start a statin to protect your heart (also because of diabetes) and then try hormonal pills.  Be Well,  Dr. Chauncey Reading

## 2021-11-27 NOTE — Progress Notes (Signed)
° ° °  SUBJECTIVE:   CHIEF COMPLAINT / HPI:   DM2: Patient had elevated A1c 6.9% on 11/10/21, she had previously been prediabetic, now entering diabetes range. Started patient on metformin 500 mg once daily, she was unable to tolerate due to GI side effects. Reports her first FBG on 1/14 was 235. She took metformin for 10 days and her FBG during that time was 100-155, but she was having 3-5 episodes of diarrhea per day. Diarrhea stopped when she stopped metformin.  Sweating: patient has been having episodes of intense sweating that seem to come out of nowhere. She will be sitting down and suddenly feel overheated, no syncope or dizziness, but will have dripping sweat. She has had to purchase fans in her house and a hand held one for work. Last TSH WNL in 2020, will obtain today. Patient had a hysterectomy and partial oopherectomy according to her in 2018. She has not had a menstrual cycle since then, but still has 1 ovary. About 1-2 years ago she was having some hot flashes and used HRT vaginal cream per her gyn. She says this helped and she stopped after a month or two. Today she also reports vaginal dryness.   HTN:  BP well controlled today and at goal. Patient reports adherence to medication.  PERTINENT  PMH / PSH: DM  OBJECTIVE:   BP 112/62    Pulse 84    Ht 5\' 1"  (1.549 m)    Wt 210 lb 3.2 oz (95.3 kg)    LMP 05/21/2017 (Exact Date)    SpO2 97%    BMI 39.72 kg/m   Nursing note and vitals reviewed GEN: diaphoretic, age-appropriate, AAW, resting comfortably in chair, NAD, class III obesity HEENT: NCAT. PERRLA. Sclera without injection or icterus. MMM.  Neck: Supple. No LAD, thyroid not palpable Cardiac: Regular rate and rhythm. Normal S1/S2. No murmurs, rubs, or gallops appreciated. 2+ radial pulses. Lungs: Clear bilaterally to ascultation. No increased WOB, no accessory muscle usage. No w/r/r. Neuro: AOx3  Ext: no edema Psych: Pleasant and appropriate   ASSESSMENT/PLAN:   HOT  FLASHES Will check TSH for good measure, but likely patient is experiencing hot flashes. Recommend tx with black cohosh or evening primrose and primarin (up to 2 g daily x3wks then 1 wk off) for dryness. Follow up in 4 weeks. If still not controlled, recommend starting pt on statin first given DM2 and ASCVD risk (7.5%), then can use systemic HRT.  Type 2 diabetes mellitus with hyperglycemia (HCC) New dx, does not tolerate metformin Current Regimen: start jardiance 10 mg qd CBGs: reported 100-150 while on metformin  Last A1c: 6.9% on 11/10/21 Denies polyuria, polydipsia, hypoglycemia  Last Eye Exam: reminded to do annually Statin: not yet- will start at next visit ACE/ARB: on losartan Check BMP due to diarrhea   Hyperlipidemia due to type 2 diabetes mellitus ([place]) ASCVD risk 7.5% in next 10 years. Recommend starting statin, but patient is sensitive to many medications, she would like to reduce amount of medications starting today. Recommend starting at next visit in 4 weeks.   HTN (hypertension) Hypertension: chronic, well controlled today - Medications: losartan-hctz 100-25 mg qd - Compliance: good - Checking BP at home: yes - Denies any SOB, CP, vision changes, LE edema, medication SEs, or symptoms of hypotension - Diet: SAD - Exercise: yes walking     Gladys Damme, MD [place]

## 2021-11-27 NOTE — Assessment & Plan Note (Signed)
Will check TSH for good measure, but likely patient is experiencing hot flashes. Recommend tx with black cohosh or evening primrose and primarin (up to 2 g daily x3wks then 1 wk off) for dryness. Follow up in 4 weeks. If still not controlled, recommend starting pt on statin first given DM2 and ASCVD risk (7.5%), then can use systemic HRT.

## 2021-11-28 ENCOUNTER — Encounter: Payer: Self-pay | Admitting: Family Medicine

## 2021-11-28 LAB — BASIC METABOLIC PANEL
BUN/Creatinine Ratio: 22 (ref 9–23)
BUN: 24 mg/dL (ref 6–24)
CO2: 26 mmol/L (ref 20–29)
Calcium: 9.7 mg/dL (ref 8.7–10.2)
Chloride: 103 mmol/L (ref 96–106)
Creatinine, Ser: 1.08 mg/dL — ABNORMAL HIGH (ref 0.57–1.00)
Glucose: 111 mg/dL — ABNORMAL HIGH (ref 70–99)
Potassium: 4.2 mmol/L (ref 3.5–5.2)
Sodium: 143 mmol/L (ref 134–144)
eGFR: 61 mL/min/{1.73_m2} (ref >59)

## 2021-11-28 LAB — TSH: TSH: 2.21 u[IU]/mL (ref 0.450–4.500)

## 2021-11-29 ENCOUNTER — Other Ambulatory Visit (HOSPITAL_COMMUNITY): Payer: Self-pay

## 2021-11-30 ENCOUNTER — Other Ambulatory Visit (HOSPITAL_COMMUNITY): Payer: Self-pay

## 2021-12-04 ENCOUNTER — Telehealth: Payer: Self-pay

## 2021-12-04 DIAGNOSIS — B3731 Acute candidiasis of vulva and vagina: Secondary | ICD-10-CM

## 2021-12-04 NOTE — Telephone Encounter (Signed)
Patient calls nurse line requesting medication for yeast infection. Patient was recently started on Jardiance and since then has been experiencing vaginal itching and irritation.   Currently denies vaginal discharge.   Please advise.   If appropriate, patient requests that rx be sent to [facility].   Talbot Grumbling, RN

## 2021-12-05 ENCOUNTER — Other Ambulatory Visit (HOSPITAL_COMMUNITY): Payer: Self-pay

## 2021-12-05 MED ORDER — FLUCONAZOLE 150 MG PO TABS
150.0000 mg | ORAL_TABLET | Freq: Once | ORAL | 0 refills | Status: AC
  Filled 2021-12-05: qty 1, 1d supply, fill #0

## 2021-12-05 NOTE — Telephone Encounter (Signed)
Fluconazole sent to preferred pharmacy. If repeat, recommend follow up in clinic and stop jardiance.  Gladys Damme, MD [facility], PGY-3

## 2021-12-07 ENCOUNTER — Other Ambulatory Visit (HOSPITAL_COMMUNITY): Payer: Self-pay

## 2021-12-14 NOTE — Addendum Note (Signed)
Addended by: Cassandria Anger on: 12/14/2021 08:48 AM   Modules accepted: Level of Service

## 2021-12-25 ENCOUNTER — Other Ambulatory Visit (HOSPITAL_COMMUNITY): Payer: Self-pay

## 2021-12-27 ENCOUNTER — Other Ambulatory Visit (HOSPITAL_COMMUNITY): Payer: Self-pay

## 2022-01-01 ENCOUNTER — Ambulatory Visit: Payer: 59 | Admitting: Family Medicine

## 2022-01-01 ENCOUNTER — Encounter: Payer: Self-pay | Admitting: Family Medicine

## 2022-01-01 ENCOUNTER — Other Ambulatory Visit: Payer: Self-pay

## 2022-01-01 VITALS — BP 120/65 | HR 88 | Ht 61.0 in | Wt 203.8 lb

## 2022-01-01 DIAGNOSIS — R350 Frequency of micturition: Secondary | ICD-10-CM

## 2022-01-01 DIAGNOSIS — R079 Chest pain, unspecified: Secondary | ICD-10-CM | POA: Diagnosis not present

## 2022-01-01 DIAGNOSIS — R0602 Shortness of breath: Secondary | ICD-10-CM | POA: Diagnosis not present

## 2022-01-01 NOTE — Progress Notes (Signed)
? ? ?  SUBJECTIVE:  ? ?CHIEF COMPLAINT / HPI:  ? ?Frequent urination: ?About one week ago noticed she had frequent urination. She states that she'll urinate on herself at night if she isn't careful. She has been checking her sugars and have seen some numbers of 200 in the morning.  No dysuria. She recently started jardiance.  ? ?Shortness of breath and chest pain: ?Noticed a heaviness in her chest yesterday. She used her rescue inhalers and went to lay down. She noticed she had it again when she woke up. She used a nebulizer and the chest pain went away. She has noticed that she has felt really bad and fatigued lately. No cough, congestion, fevers, vomiting. She endorses baseline shortness of breath since Covid in 2020 but states it was worse on Friday. She follows with pulmonology and sees them March 22.  She states she did take a nebulizer treatment up for coming to the visit today.  She denies any chest pain or shortness of breath currently. ? ?PERTINENT  PMH / PSH: Type 2 diabetes ? ?OBJECTIVE:  ? ?BP 120/65   Pulse 88   Ht '5\' 1"'$  (1.549 m)   Wt 203 lb 12.8 oz (92.4 kg)   LMP 05/21/2017 (Exact Date)   SpO2 90%   BMI 38.51 kg/m?   ? ?General: NAD, pleasant, able to participate in exam ?Cardiac: RRR, no murmurs.  No pain with palpation of the anterior chest wall ?Respiratory: Normal work of breathing, lungs clear throughout ?Extremities: no edema or cyanosis. ?Skin: warm and dry, no rashes noted ?Neuro: alert, no obvious focal deficits ?Psych: Normal affect and mood ? ?ASSESSMENT/PLAN:  ? ?Urinary frequency: ?Anticipate her frequent urination is due to [place] as it started shortly after starting that medication.  She has no dysuria or urinary symptoms besides frequency and no suprapubic discomfort so I do not think it is needed to check a UA today.  We will discontinue the Jardiance and see if this symptom improves.  If it does not she is going to follow-up for further evaluation. ? ?Chest pain/shortness of  breath: ?Patient has baseline shortness of breath and follows with pulmonology but states her shortness of breath worsened on Friday.  No signs of a respiratory virus and her lungs sound clear on exam, however she states she did take a nebulizer treatment before coming to the visit.  Her chest pain does seem to improve after nebulizer treatments.  She has no chest pain in the office today.  Unclear if this chest pain is related to her respiratory status or if she has associated coronary artery disease as a cause.  We will get her referred for cardiology for further work-up of this.  I will FYI her pulmonologist who she is seeing in the next week or 2 as well.  We will check a CBC and a CMP today also.  Discussed strict ED/return precautions in the meantime. ? ?Lurline Del, DO ?[place]  ? ? ? ?

## 2022-01-01 NOTE — Patient Instructions (Signed)
I am placing referral for cardiology.  They should contact you in the next week or 2 to set up an appointment.  If you develop any crushing chest pain, chest pain that does not resolve with the nebulizer treatment, or worsening shortness of breath you should be seen in the emergency department. ? ?I want you to stop your Jardiance medicine.  If this resolves your urinary frequency symptoms in the next 1 week you do not need follow-up for that.  If it does not please see [place] back and we will do further work-up.  We will check some blood work today and I will let you know the results when they return. ?

## 2022-01-02 LAB — COMPREHENSIVE METABOLIC PANEL
ALT: 20 IU/L (ref 0–32)
AST: 15 IU/L (ref 0–40)
Albumin/Globulin Ratio: 1.3 (ref 1.2–2.2)
Albumin: 4.3 g/dL (ref 3.8–4.9)
Alkaline Phosphatase: 77 IU/L (ref 44–121)
BUN/Creatinine Ratio: 18 (ref 9–23)
BUN: 24 mg/dL (ref 6–24)
Bilirubin Total: 0.3 mg/dL (ref 0.0–1.2)
CO2: 25 mmol/L (ref 20–29)
Calcium: 9.7 mg/dL (ref 8.7–10.2)
Chloride: 98 mmol/L (ref 96–106)
Creatinine, Ser: 1.36 mg/dL — ABNORMAL HIGH (ref 0.57–1.00)
Globulin, Total: 3.3 g/dL (ref 1.5–4.5)
Glucose: 116 mg/dL — ABNORMAL HIGH (ref 70–99)
Potassium: 4.6 mmol/L (ref 3.5–5.2)
Sodium: 138 mmol/L (ref 134–144)
Total Protein: 7.6 g/dL (ref 6.0–8.5)
eGFR: 46 mL/min/{1.73_m2} — ABNORMAL LOW (ref >59)

## 2022-01-02 LAB — CBC WITH DIFFERENTIAL/PLATELET
Basophils Absolute: 0.1 10*3/uL (ref 0.0–0.2)
Basos: 1 %
EOS (ABSOLUTE): 0.1 10*3/uL (ref 0.0–0.4)
Eos: 1 %
Hematocrit: 33.1 % — ABNORMAL LOW (ref 34.0–46.6)
Hemoglobin: 10.9 g/dL — ABNORMAL LOW (ref 11.1–15.9)
Immature Grans (Abs): 0.1 10*3/uL (ref 0.0–0.1)
Immature Granulocytes: 1 %
Lymphocytes Absolute: 1.2 10*3/uL (ref 0.7–3.1)
Lymphs: 11 %
MCH: 30 pg (ref 26.6–33.0)
MCHC: 32.9 g/dL (ref 31.5–35.7)
MCV: 91 fL (ref 79–97)
Monocytes Absolute: 1.1 10*3/uL — ABNORMAL HIGH (ref 0.1–0.9)
Monocytes: 10 %
Neutrophils Absolute: 8.1 10*3/uL — ABNORMAL HIGH (ref 1.4–7.0)
Neutrophils: 76 %
Platelets: 352 10*3/uL (ref 150–450)
RBC: 3.63 x10E6/uL — ABNORMAL LOW (ref 3.77–5.28)
RDW: 11.6 % — ABNORMAL LOW (ref 11.7–15.4)
WBC: 10.6 10*3/uL (ref 3.4–10.8)

## 2022-01-10 DIAGNOSIS — J45901 Unspecified asthma with (acute) exacerbation: Secondary | ICD-10-CM | POA: Diagnosis not present

## 2022-01-10 DIAGNOSIS — G4733 Obstructive sleep apnea (adult) (pediatric): Secondary | ICD-10-CM | POA: Diagnosis not present

## 2022-01-15 ENCOUNTER — Other Ambulatory Visit (HOSPITAL_COMMUNITY): Payer: Self-pay

## 2022-01-15 ENCOUNTER — Ambulatory Visit: Payer: 59 | Admitting: Family Medicine

## 2022-01-15 ENCOUNTER — Encounter: Payer: Self-pay | Admitting: Family Medicine

## 2022-01-15 ENCOUNTER — Other Ambulatory Visit: Payer: Self-pay

## 2022-01-15 VITALS — BP 146/86 | HR 80 | Wt 207.2 lb

## 2022-01-15 DIAGNOSIS — F418 Other specified anxiety disorders: Secondary | ICD-10-CM | POA: Diagnosis not present

## 2022-01-15 DIAGNOSIS — E1165 Type 2 diabetes mellitus with hyperglycemia: Secondary | ICD-10-CM

## 2022-01-15 DIAGNOSIS — J4540 Moderate persistent asthma, uncomplicated: Secondary | ICD-10-CM

## 2022-01-15 DIAGNOSIS — I10 Essential (primary) hypertension: Secondary | ICD-10-CM

## 2022-01-15 MED ORDER — SERTRALINE HCL 50 MG PO TABS
50.0000 mg | ORAL_TABLET | Freq: Every day | ORAL | 3 refills | Status: DC
  Filled 2022-01-15: qty 30, 30d supply, fill #0

## 2022-01-15 MED ORDER — ROSUVASTATIN CALCIUM 5 MG PO TABS
5.0000 mg | ORAL_TABLET | Freq: Every day | ORAL | 1 refills | Status: DC
  Filled 2022-01-15: qty 22, 22d supply, fill #0
  Filled 2022-01-15: qty 8, 8d supply, fill #0

## 2022-01-15 MED ORDER — LOSARTAN POTASSIUM-HCTZ 100-25 MG PO TABS
1.0000 | ORAL_TABLET | Freq: Every day | ORAL | 1 refills | Status: DC
  Filled 2022-01-15 – 2022-02-23 (×2): qty 90, 90d supply, fill #0

## 2022-01-15 MED ORDER — OXYBUTYNIN CHLORIDE ER 10 MG PO TB24
10.0000 mg | ORAL_TABLET | Freq: Every day | ORAL | 2 refills | Status: DC
  Filled 2022-01-15: qty 30, 30d supply, fill #0
  Filled 2022-02-23: qty 30, 30d supply, fill #1

## 2022-01-15 MED ORDER — BENZONATATE 100 MG PO CAPS
100.0000 mg | ORAL_CAPSULE | Freq: Two times a day (BID) | ORAL | 0 refills | Status: DC | PRN
Start: 2022-01-15 — End: 2022-08-07
  Filled 2022-01-15: qty 20, 10d supply, fill #0

## 2022-01-15 MED ORDER — AZELASTINE HCL 0.1 % NA SOLN
1.0000 | Freq: Two times a day (BID) | NASAL | 12 refills | Status: DC
  Filled 2022-01-15: qty 30, 50d supply, fill #0

## 2022-01-15 MED ORDER — FLUTICASONE PROPIONATE 50 MCG/ACT NA SUSP
2.0000 | Freq: Every day | NASAL | 6 refills | Status: DC
  Filled 2022-01-15: qty 16, 30d supply, fill #0
  Filled 2022-02-23: qty 16, 30d supply, fill #1

## 2022-01-15 NOTE — Progress Notes (Signed)
? ? ?  SUBJECTIVE:  ? ?CHIEF COMPLAINT / HPI: medication management  ? ?Patient presents for follow up regarding medications. She states that she had a tough time with diabetes diagnosis and switching medications. Last A1c was improved so she has since stopped the diabetes medications and reports feeling much better. Patient states that she felt fatigued most days while on the medications and would sleep most of the day, this is improving. She is requesting refills on medications today.  ? ?PERTINENT  PMH / PSH:  ?HTN  ?Asthma  ?Migraine HA ? ?OBJECTIVE:  ? ?BP (!) 146/86   Pulse 80   Wt 207 lb 3.2 oz (94 kg)   LMP 05/21/2017 (Exact Date)   SpO2 100%   BMI 39.15 kg/m?   ?General: female appearing stated age in no acute distress ?Cardio: Normal S1 and S2, no S3 or S4. Rhythm is regular. No murmurs or rubs.  Bilateral radial pulses palpable ?Pulm: Clear to auscultation bilaterally, no crackles, wheezing, or diminished breath sounds. Normal respiratory effort, stable on RA ?Abdomen: Bowel sounds normal. Abdomen soft and non-tender.  ?Extremities: No peripheral edema. Warm/ well perfused.  ?Neuro: pt alert and oriented x4, some conjunctival injection  ? ? ?ASSESSMENT/PLAN:  ? ?HTN (hypertension) ?Continue hyzaar 100-25 daily  ?BP elevated on initial check, improved but still elevated 148/86 on repeat  ?Patient reports having mild HA today and is due for migraine injection soon so contributes changes in BP to this  ?Reports adherence to medication regimen  ?Will prescribe refills for hyzaar ? ?Asthma ?Patient continues to follow with pulmonology, reports that she has appt on 01/16/22 ?Will obtain refills at follow up for her inhalers  ?Continues to use Breztri and albuterol and tessalon perles PRN ? ?Type 2 diabetes mellitus with hyperglycemia (HCC) ?A1c 6.9 in Jan 2023 ?Unable to tolerate metformin nor jardiance  ?Will need repeat A1c and check BG at follow up  ?Recommend lipid panel at f/u  ?  ? ? ?Eulis Foster, MD ?[facility]  ?

## 2022-01-15 NOTE — Patient Instructions (Addendum)
Please plan to follow-up with me in 6 weeks to recheck your hemoglobin.  Please continue to consume iron rich foods and vegetables to help improve this number and let us know if he noticed any bleeding in your urine or bowel movements. ? ?I have submitted medication refills as requested.  ?

## 2022-01-16 ENCOUNTER — Other Ambulatory Visit (HOSPITAL_COMMUNITY): Payer: Self-pay

## 2022-01-16 ENCOUNTER — Other Ambulatory Visit: Payer: Self-pay | Admitting: Pulmonary Disease

## 2022-01-17 ENCOUNTER — Ambulatory Visit: Payer: 59 | Admitting: Pulmonary Disease

## 2022-01-17 ENCOUNTER — Encounter (HOSPITAL_COMMUNITY): Payer: Self-pay

## 2022-01-17 ENCOUNTER — Other Ambulatory Visit: Payer: Self-pay

## 2022-01-17 ENCOUNTER — Other Ambulatory Visit (HOSPITAL_COMMUNITY): Payer: Self-pay

## 2022-01-17 ENCOUNTER — Encounter: Payer: Self-pay | Admitting: Pulmonary Disease

## 2022-01-17 ENCOUNTER — Telehealth (HOSPITAL_COMMUNITY): Payer: Self-pay

## 2022-01-17 VITALS — BP 126/80 | HR 74 | Temp 97.5°F | Ht 61.0 in | Wt 206.0 lb

## 2022-01-17 DIAGNOSIS — G4733 Obstructive sleep apnea (adult) (pediatric): Secondary | ICD-10-CM

## 2022-01-17 DIAGNOSIS — Z9989 Dependence on other enabling machines and devices: Secondary | ICD-10-CM

## 2022-01-17 DIAGNOSIS — R0602 Shortness of breath: Secondary | ICD-10-CM | POA: Diagnosis not present

## 2022-01-17 DIAGNOSIS — J4550 Severe persistent asthma, uncomplicated: Secondary | ICD-10-CM | POA: Diagnosis not present

## 2022-01-17 DIAGNOSIS — E669 Obesity, unspecified: Secondary | ICD-10-CM

## 2022-01-17 MED ORDER — CETIRIZINE HCL 10 MG PO TABS
10.0000 mg | ORAL_TABLET | Freq: Every day | ORAL | 4 refills | Status: DC | PRN
  Filled 2022-01-17: qty 90, 90d supply, fill #0

## 2022-01-17 MED ORDER — ALBUTEROL SULFATE (2.5 MG/3ML) 0.083% IN NEBU
2.5000 mg | INHALATION_SOLUTION | Freq: Four times a day (QID) | RESPIRATORY_TRACT | 0 refills | Status: DC | PRN
  Filled 2022-01-17: qty 75, 7d supply, fill #0

## 2022-01-17 MED ORDER — LEVALBUTEROL HCL 1.25 MG/3ML IN NEBU
1.2500 mg | INHALATION_SOLUTION | Freq: Three times a day (TID) | RESPIRATORY_TRACT | 2 refills | Status: DC | PRN
  Filled 2022-01-17: qty 90, 10d supply, fill #0
  Filled 2022-09-26: qty 90, 10d supply, fill #1

## 2022-01-17 NOTE — Telephone Encounter (Signed)
Attempted to call patient in regards to Pulmonary Rehab - LM on VM Mailed letter 

## 2022-01-17 NOTE — Patient Instructions (Signed)
We have sent your Zyrtec and albuterol prescriptions to the [facility]. ? ?We will see you in follow-up in 3 months time.  Continue your Dupixent and your inhalers. ? ?Your lungs sounded really good today. ?
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
## 2022-01-18 NOTE — Assessment & Plan Note (Addendum)
Patient continues to follow with pulmonology, reports that she has appt on 01/16/22 ?Will obtain refills at follow up for her inhalers  ?Continues to use Breztri and albuterol and tessalon perles PRN ?

## 2022-01-18 NOTE — Assessment & Plan Note (Signed)
Continue hyzaar 100-25 daily  ?BP elevated on initial check, improved but still elevated 148/86 on repeat  ?Patient reports having mild HA today and is due for migraine injection soon so contributes changes in BP to this  ?Reports adherence to medication regimen  ?Will prescribe refills for hyzaar ?

## 2022-01-18 NOTE — Assessment & Plan Note (Addendum)
A1c 6.9 in Jan 2023 ?Unable to tolerate metformin nor jardiance  ?Will need repeat A1c and check BG at follow up  ?Recommend lipid panel at f/u  ?

## 2022-01-19 ENCOUNTER — Other Ambulatory Visit (HOSPITAL_COMMUNITY): Payer: Self-pay

## 2022-01-24 ENCOUNTER — Other Ambulatory Visit (HOSPITAL_COMMUNITY): Payer: Self-pay

## 2022-01-30 ENCOUNTER — Telehealth: Payer: Self-pay

## 2022-01-30 NOTE — Telephone Encounter (Signed)
Received notification from [facility] regarding a prior authorization for [place]. Authorization has been APPROVED from 01/30/2022 to 01/30/2023. Approval letter sent to scan center. Updated [employer] with current Prior Authorization information. ? ?Authorization # [id number] ?CMM Key: [id number] ?

## 2022-02-01 ENCOUNTER — Telehealth (HOSPITAL_COMMUNITY): Payer: Self-pay

## 2022-02-01 NOTE — Telephone Encounter (Signed)
No response from pt regarding CR.  Closed referral.  

## 2022-02-07 ENCOUNTER — Ambulatory Visit: Payer: 59 | Admitting: Neurology

## 2022-02-07 ENCOUNTER — Ambulatory Visit: Payer: 59 | Admitting: Obstetrics and Gynecology

## 2022-02-14 ENCOUNTER — Ambulatory Visit: Payer: 59 | Admitting: Neurology

## 2022-02-16 ENCOUNTER — Other Ambulatory Visit (HOSPITAL_COMMUNITY): Payer: Self-pay

## 2022-02-19 NOTE — Progress Notes (Signed)
? ?ANNUAL EXAM ?Patient name: [name] MRN [id number]  Date of birth: 1966-06-12 ?Chief Complaint:   ?Gynecologic Exam ? ?History of Present Illness:   ?[name] is a 56 y.o. [name] female being seen today for a routine annual exam.  ? ?Current complaints: No issues except vaginal dryness and overactive bladder. Does home pelvic exercises. She has done premarin in the past and felt it improved things but she would like to try something different.  ? ?Patient's last menstrual period was 05/21/2017 (exact date). ? ? ? ?Last pap 04/2017. Results were: NILM w/ HRHPV negative. H/O abnormal pap: not recent - reviewed paps since 2003. Pt had a hyst but supracervical. She has a h/o LEEP procedure prior to 2003.  ? ?Health Maintenance Due  ?Topic Date Due  ? FOOT EXAM  Never done  ? OPHTHALMOLOGY EXAM  Never done  ? Zoster Vaccines- Shingrix (1 of 2) Never done  ? COVID-19 Vaccine (4 - Booster) 09/13/2020  ? ? ? ? ?  02/23/2022  ? 10:55 AM 01/15/2022  ?  3:34 PM 11/27/2021  ?  3:37 PM 08/16/2021  ?  2:39 PM 07/19/2021  ?  3:27 PM  ?Depression screen PHQ 2/9  ?Decreased Interest 2 0 0 0 0  ?Down, Depressed, Hopeless 0 0 0 0 0  ?PHQ - 2 Score 2 0 0 0 0  ?Altered sleeping 0 3 1 0 0  ?Tired, decreased energy '2 2 1 '$ 0 1  ?Change in appetite 0 0 0 0 0  ?Feeling bad or failure about yourself  0 0 0 0 0  ?Trouble concentrating 0 0 0 0 0  ?Moving slowly or fidgety/restless 0 0 3 0 0  ?Suicidal thoughts 0 0 0 0 0  ?PHQ-9 Score '4 5 5 '$ 0 1  ?Difficult doing work/chores  Not difficult at all  Not difficult at all   ? ?  ? ?  02/23/2022  ? 10:55 AM 09/11/2017  ?  2:47 PM 09/11/2017  ?  2:07 PM 05/08/2017  ?  3:48 PM  ?GAD 7 : Generalized Anxiety Score  ?Nervous, Anxious, on Edge 0  0 0  ?Control/stop worrying 0 0 0 0  ?Worry too much - different things 0 1 1 0  ?Trouble relaxing 0 1 1 0  ?Restless 0 0 0 0  ?Easily annoyed or irritable 0 0 0 0  ?Afraid - awful might happen 0 0 0 0  ?Total GAD 7 Score 0  2 0  ? ? ? ?Review of Systems:    ?Pertinent items are noted in HPI ?Denies any headaches, blurred vision, fatigue, shortness of breath, chest pain, abdominal pain, abnormal vaginal discharge/itching/odor/irritation, problems with periods, bowel movements, or urination unless otherwise stated above.  ?Pertinent History Reviewed:  ?Reviewed past medical,surgical, social and family history.  ?Reviewed problem list, medications and allergies. ?Physical Assessment:  ? ?Vitals:  ? 02/23/22 1053  ?BP: (!) 145/72  ?Pulse: 72  ?Weight: 207 lb 1.6 oz (93.9 kg)  ?Height: '5\' 1"'$  (1.549 m)  ?Body mass index is 39.13 kg/m?. ?  ?Physical Examination:  ?General appearance - well appearing, and in no distress ?Mental status - alert, oriented to person, place, and time ?Psych:  She has a normal mood and affect ?Skin - warm and dry, normal color, no suspicious lesions noted ?Chest - effort normal, all lung fields clear to auscultation bilaterally ?Heart - normal rate and regular rhythm ?Neck:  midline trachea, no thyromegaly or nodules ?Breasts -  breasts appear normal, no suspicious masses, no skin or nipple changes or axillary nodes ?Abdomen - soft, nontender, nondistended, no masses or organomegaly ?Pelvic -  ?VULVA: normal appearing vulva with no masses, tenderness or lesions   ?VAGINA: normal appearing vagina with normal color and discharge, no lesions   ?CERVIX: normal appearing cervix without discharge or lesions, no CMT ?UTERUS: surgical absent  ?ADNEXA: No adnexal masses or tenderness noted. ?Extremities:  No swelling or varicosities noted ? ?Chaperone present for exam ? ?No results found for this or any previous visit (from the past 24 hour(s)).  ?Assessment & Plan:  ?[name] was seen today for gynecologic exam. ? ?Diagnoses and all orders for this visit: ? ?Encounter for annual routine gynecological examination ?- Cervical cancer screening: Discussed guidelines. Has had hyst but supracervical and h/o LEEP procedure. Pap with HPV done. GC/CT done as well.  ?-  Breast Health: Encouraged self breast awareness/SBE. Discussed limits of clinical breast exam for detecting breast cancer. Discussed importance of annual MXR. Normal 04/2021.  ?- Climacteric/Sexual health: Reviewed typical and atypical symptoms of menopause/peri-menopause. Discussed PMB and to call if any amount of spotting.  ?- Bone Health: Calcium via diet and supplementation. Discussed weight bearing exercise. DEXA not yet due ?- Colonoscopy: 03/2018 - given 10 years ?- F/U 12 months and prn ?-     Cytology - PAP( [facility]) ?-     MM 3D SCREEN BREAST BILATERAL; Future ? ?Routine screening for STI (sexually transmitted infection) ?-     HIV Antibody (routine testing w rflx) ?-     RPR ?-     Hepatitis B Surface AntiGEN ? ?Vaginal dryness ?- Vaginal dryness most likely due to being postmenopausal ?- Recommended OTC lubricants i.e. silicone or oil based as well as vaginal moisturizer i.e. Replens ?- Discussed if these initial options fail, would also consider vaginal estrogen. Discussed different formulations of vaginal estrogen. Reviewed the risks of vaginal estrogen.  ?- She would like: vaginal cream ?-     estradiol (ESTRACE VAGINAL) 0.1 MG/GM vaginal cream; Place 1 g vaginally daily. Daily for 2 weeks then twice weekly thereafter ? ?Overactive bladder ?- May help but if not another option would be PFPT. She is already also on oxybutinin.  ?-     estradiol (ESTRACE VAGINAL) 0.1 MG/GM vaginal cream; Place 1 g vaginally daily. Daily for 2 weeks then twice weekly thereafter ? ? ? ?Orders Placed This Encounter  ?Procedures  ? MM 3D SCREEN BREAST BILATERAL  ? HIV Antibody (routine testing w rflx)  ? RPR  ? Hepatitis B Surface AntiGEN  ? ? ?Meds:  ?Meds ordered this encounter  ?Medications  ? estradiol (ESTRACE VAGINAL) 0.1 MG/GM vaginal cream  ?  Sig: Place 1 g vaginally daily. Daily for 2 weeks then twice weekly thereafter  ?  Dispense:  42.5 g  ?  Refill:  2  ? ? ?Follow-up: Return in about 1 year (around  02/24/2023) for annual. ? ?Radene Gunning, MD ?02/23/2022 ?11:40 AM ?

## 2022-02-20 ENCOUNTER — Other Ambulatory Visit (HOSPITAL_COMMUNITY): Payer: Self-pay

## 2022-02-23 ENCOUNTER — Other Ambulatory Visit (HOSPITAL_COMMUNITY): Payer: Self-pay

## 2022-02-23 ENCOUNTER — Other Ambulatory Visit: Payer: Self-pay | Admitting: Family Medicine

## 2022-02-23 ENCOUNTER — Ambulatory Visit (INDEPENDENT_AMBULATORY_CARE_PROVIDER_SITE_OTHER): Payer: 59 | Admitting: Obstetrics and Gynecology

## 2022-02-23 ENCOUNTER — Encounter: Payer: Self-pay | Admitting: Obstetrics and Gynecology

## 2022-02-23 ENCOUNTER — Other Ambulatory Visit (HOSPITAL_COMMUNITY)
Admission: RE | Admit: 2022-02-23 | Discharge: 2022-02-23 | Disposition: A | Discharge status: Home / Self Care | Payer: 59 | Source: Ambulatory Visit | Attending: Obstetrics and Gynecology | Admitting: Obstetrics and Gynecology

## 2022-02-23 ENCOUNTER — Other Ambulatory Visit: Payer: Self-pay | Admitting: Neurology

## 2022-02-23 VITALS — BP 145/72 | HR 72 | Ht 61.0 in | Wt 207.1 lb

## 2022-02-23 DIAGNOSIS — N3281 Overactive bladder: Secondary | ICD-10-CM | POA: Diagnosis not present

## 2022-02-23 DIAGNOSIS — G43701 Chronic migraine without aura, not intractable, with status migrainosus: Secondary | ICD-10-CM

## 2022-02-23 DIAGNOSIS — R102 Pelvic and perineal pain: Secondary | ICD-10-CM

## 2022-02-23 DIAGNOSIS — Z01419 Encounter for gynecological examination (general) (routine) without abnormal findings: Secondary | ICD-10-CM | POA: Diagnosis not present

## 2022-02-23 DIAGNOSIS — N958 Other specified menopausal and perimenopausal disorders: Secondary | ICD-10-CM

## 2022-02-23 DIAGNOSIS — N898 Other specified noninflammatory disorders of vagina: Secondary | ICD-10-CM | POA: Diagnosis not present

## 2022-02-23 DIAGNOSIS — Z113 Encounter for screening for infections with a predominantly sexual mode of transmission: Secondary | ICD-10-CM

## 2022-02-23 MED ORDER — ESTRADIOL 0.1 MG/GM VA CREA: 1.0000 g | TOPICAL_CREAM | Freq: Every day | VAGINAL | 2 refills | Status: DC

## 2022-02-24 LAB — RPR: RPR Ser Ql: NONREACTIVE

## 2022-02-24 LAB — HEPATITIS B SURFACE ANTIGEN: Hepatitis B Surface Ag: NEGATIVE

## 2022-02-24 LAB — HIV ANTIBODY (ROUTINE TESTING W REFLEX): HIV Screen 4th Generation wRfx: NONREACTIVE

## 2022-02-26 ENCOUNTER — Other Ambulatory Visit (HOSPITAL_COMMUNITY): Payer: Self-pay

## 2022-02-26 MED ORDER — ESTRADIOL 0.1 MG/GM VA CREA
1.0000 g | TOPICAL_CREAM | Freq: Every day | VAGINAL | 2 refills | Status: DC
  Filled 2022-02-26: qty 42.5, 90d supply, fill #0
  Filled 2022-05-09 – 2022-06-21 (×2): qty 42.5, 90d supply, fill #1
  Filled 2022-08-17 – 2022-09-26 (×2): qty 42.5, 90d supply, fill #2

## 2022-02-26 MED ORDER — AJOVY 225 MG/1.5ML ~~LOC~~ SOAJ
225.0000 mg | SUBCUTANEOUS | 0 refills | Status: DC
  Filled 2022-02-26: qty 1.5, 30d supply, fill #0

## 2022-02-26 NOTE — Progress Notes (Signed)
Pt called today 5/1 and left VM on nurse line. Pt states Estradiol Rx was not sent to pharmacy. Saw that Rx did not go through electronically. Resent Rx per Dr Damita Dunnings notes 4/28. ?Tanice Petre,RN   ?

## 2022-02-26 NOTE — Addendum Note (Signed)
Addended by: Georgia Lopes on: 02/26/2022 04:21 PM ? ? Modules accepted: Orders ? ?

## 2022-02-27 ENCOUNTER — Other Ambulatory Visit (HOSPITAL_COMMUNITY): Payer: Self-pay

## 2022-02-27 MED ORDER — NAPROXEN 500 MG PO TABS
500.0000 mg | ORAL_TABLET | Freq: Two times a day (BID) | ORAL | 1 refills | Status: DC | PRN
  Filled 2022-02-27: qty 90, 45d supply, fill #0

## 2022-02-27 MED ORDER — GABAPENTIN 300 MG PO CAPS
300.0000 mg | ORAL_CAPSULE | Freq: Three times a day (TID) | ORAL | 2 refills | Status: DC
  Filled 2022-02-27: qty 90, 30d supply, fill #0
  Filled 2022-06-21: qty 90, 30d supply, fill #1

## 2022-02-28 ENCOUNTER — Telehealth: Payer: Self-pay

## 2022-02-28 LAB — CYTOLOGY - PAP
Adequacy: ABSENT
Chlamydia: NEGATIVE
Comment: NEGATIVE
Comment: NEGATIVE
Comment: NORMAL
Diagnosis: UNDETERMINED — AB
High risk HPV: NEGATIVE
Neisseria Gonorrhea: NEGATIVE

## 2022-02-28 NOTE — Telephone Encounter (Addendum)
-----   Message from Radene Gunning, MD sent at 02/28/2022  1:23 PM EDT ----- ?Pap shows ASCUS but also HPV negative. Recommend pap in one year.  ? ?Also her blood work is all negative.  ? ?Thanks, pad ? ? ?VM left stating I am calling with results, call back number given. ?

## 2022-02-28 NOTE — Patient Instructions (Addendum)
Below is our plan: ? ?We will continue Ajovy monthly. We will start Nurtec as needed for abortive therapy. Take 1 tablet ('75mg'$ ) as needed for migraine abortion.  ? ?Continue regular use of CPAP ? ?Please make sure you are staying well hydrated. I recommend 50-60 ounces daily. Well balanced diet and regular exercise encouraged. Consistent sleep schedule with 6-8 hours recommended.  ? ?Please continue follow up with care team as directed.  ? ?Follow up with me in 1 year ? ?You may receive a survey regarding today's visit. I encourage you to leave honest feed back as I do use this information to improve patient care. Thank you for seeing me today!  ? ? ?

## 2022-02-28 NOTE — Progress Notes (Signed)
? ? ?PATIENT: [name] ?DOB: 17-Sep-1966 ? ?REASON FOR VISIT: follow up ?HISTORY FROM: patient ? ?Chief Complaint  ?Patient presents with  ? Follow-up  ?  RM 16, alone. migraine f/u. Since weather changes/allergies, having more headaches. Getting 3 headaches per week. Getting about 5 migraines per month. Needs to get eyes checked.   ?  ?HISTORY OF PRESENT ILLNESS: ? ?03/01/22 ALL: ?[name] returns for follow up for migraines. She was last seen by Dr Jaynee Eagles 01/2021 and doing very well. She continues Ajovy. She feels that injections do help. She reports headaches have waxed and waned since. She is having more trouble with her blood pressure being elevated. PCP has changed her medications. She is under more stress recently. She reports her grand children's mother passed away, recently. She is having 8-12 headache days a month. Maybe 2-3 severe headaches are severe. Rest and warm compresses help. She feels that allergies contribute. She is taking Zyrtec daily. She is using CPAP every night. Apnea is reportedly well managed. Followed by pulmonology. She works from 3p-3a.  ? ?02/01/2021 AA:   ?She was married in November 2021. She has 5 sons. We looked at wedding photos, she had covid in January, she is still recovering, she follows with pulmonology, she is using her cpap. She is compliant with her cpap. She hasn't had a headache since her allergies. She is doing great. ? ?05/21/2019 ALL:  ?[name] is a 56 y.o. female here today for follow up of migraines.  Her last visit was about a month ago when she reported having some difficulty at the pharmacy with her medications.  Her dose of Ajovy was given the last week in May.  She has not had a dose of Ajovy since.  She reports trying Relpax that did offer minimal relief.  She continues to have consistent migraines.  She reports at least 4-5 migraines per week.  She was seen by her pulmonologist about a month ago who reported poor compliance with CPAP therapy.  She states that  she is usually consistent in use but over the past 6 weeks or so she has not used her CPAP.  She states that frequent sinus infections and headaches prevent her from using CPAP.  She is treated with Zyrtec daily for seasonal allergies.  She has a tension type headache nearly every day.  She reports migraines have recently been behind her right eye.  She gets very fatigued, nauseated, dizzy and occasionally has some numbness in her fingertips with migraine.  She reports tension in her neck and the base of her skull with headaches.  She does occasionally have blurred vision.  She has recently had an eye exam that was reportedly normal.  She does wear glasses. ? ?HISTORY: (copied from my note on 04/01/2019) ? ?[name] is a 56 y.o. female here today for follow up of migraines. She continues Ajovy for prevention and Imitrex for acute management. She is also prescribed gabapentin for pain and hot flashes as well as naproxen for pain.  She reports that she was late getting this month's dose of Ajovy due to pharmacy delay.  She has noted an increased frequency of her headaches this past week.  She has a migraine today that is been present for the past 3 days.  She has not taking any abortive therapy due to not having any.  She reports that this is her typical migraine.  She denies any new finding on. ?  ?History (copied from  Dr Cathren Laine note on 03/19/2018) ?  ?Interval history 03/19/2018: The Ajovy has helped with the headaches she likes it. She has had a great response. 2 migraines in April lasted 45 minutes. Most of the month she had no headaches she had some but not severe. Feeling great. Will continue Ajovy. Discussed botox and other treatments. ?  ?Interval History 09/10/2017: At last appointment ordered MRIs of the brain and orbits, they were not completed. Also ordered physical therapy but finished only 2/13 visits. Had surgery 08/15/2017. hysterectomy, Bilateral salpingectomy, left oopherectomy. She can't afford MRI  or PT. Started Aimovig, but she could not get it. Will try Ajovy, will give a copay ard.  ?  ?HPI:  [name] is a 56 y.o. female here as a referral from Dr. Tammi Klippel for migraines. Past medical history of migraines, high blood pressure, anxiety, depression, asthma.  Headaches wake her up in the morning. Migraines since the age of 41. Worse this year, she was taking care of her grandmother and passed away and there has been stress and a bad relationship. She has had migraine for days after getting a flu shot. She was a patient of the [facility]. They start at the back of there head and spread all ove rthe head, she has tenderness of the skin, She has a prodrome feels the pain in the neck. For the last year. She has light and sound sensitivity, it is pulsating and throbbing and pounding with nausea and vomiting and also has vision changes and episodic vision loss unknown eye. Migraines can last weeks, has had one for weeks now, she was in the ED last Saturday, she is missing work. Sleeping in a dark room helps. Stress worsens it. Woke up with a headache again this morning and she has neck pain and neck tightness. Reports daily headaches and at least 15 are migrainous for the last 9 months. She has allergies as well. No medication overuse. No aura. No other focal neurologic deficits, associated symptoms, inciting events or modifiable factors. Lots of stress.  ?  ?Medications tried include: Baclofen, Imitrex, Lexapro, gabapentin, losartan, naproxen, Zofran, propranolol, tramadol. She tried amitriptyline and has an allergy to it, zomig ?  ?Reviewed notes, labs and imaging from outside physicians, which showed: ?  ?presented the emergency room for evaluation of intermittent, generalized headache for the past 4 days on 07/20/2017. Patient had associated dizziness and feeling off balance with nausea. The headache began in the back of the head and radiated to the front. Similar to previous migraines. She  tried ibuprofen. The past she received several Botox injections with provided her with 1-2 years relief in her migraines. Denied any numbness, fevers, chills, headache injury, falls, blood thinner use. She was given Compazine, Benadryl and IV fluids in the emergency room. Symptoms reviewed and she was discharged.  ? ?REVIEW OF SYSTEMS: Out of a complete 14 system review of symptoms, the patient complains only of the following symptoms, memory loss, dizziness, headaches, numbness and all other reviewed systems are negative. ? ?ALLERGIES: ?Allergies  ?Allergen Reactions  ? Amitriptyline   ?  Abnormal behavior. Just doesn't feel like normal self   ? Fluticasone-Salmeterol Other (See Comments)  ?  Thrush even with mouth rinsing; worsens cough  ? Imitrex [Sumatriptan]   ?  Made her head feel like its on fire  ? Penicillins Itching and Swelling  ?  Has patient had a PCN reaction causing immediate rash, facial/tongue/throat swelling, SOB or lightheadedness with hypotension: No ?  Has patient had a PCN reaction causing severe rash involving mucus membranes or skin necrosis: No ?Has patient had a PCN reaction that required hospitalization: No ?Has patient had a PCN reaction occurring within the last 10 years: Yes ?If all of the above answers are "NO", then may proceed with Cephalosporin use. ?  ? Ciprofloxacin Itching and Rash  ? Macrobid [Nitrofurantoin Monohyd Macro] Itching  ? Sulfamethoxazole-Trimethoprim Rash  ? ? ?HOME MEDICATIONS: ?Outpatient Medications Prior to Visit  ?Medication Sig Dispense Refill  ? albuterol (VENTOLIN HFA) 108 (90 Base) MCG/ACT inhaler Inhale 2 puffs into the lungs every 6 (six) hours as needed for wheezing or shortness of breath. 8.5 g 6  ? azelastine (ASTELIN) 0.1 % nasal spray Place 1 spray into both nostrils 2 (two) times daily. Use in each nostril as directed 30 mL 12  ? benzonatate (TESSALON) 100 MG capsule Take 1 capsule (100 mg total) by mouth 2 (two) times daily as needed for cough. 20  capsule 0  ? BIOTIN PO Take 2,000 mg by mouth daily.    ? blood glucose meter kit and supplies KIT Use as directed. 1 each 0  ? Budeson-Glycopyrrol-Formoterol (BREZTRI AEROSPHERE) 160-9-4.8 MCG/ACT AERO I

## 2022-03-01 ENCOUNTER — Encounter: Payer: Self-pay | Admitting: Family Medicine

## 2022-03-01 ENCOUNTER — Other Ambulatory Visit (HOSPITAL_COMMUNITY): Payer: Self-pay

## 2022-03-01 ENCOUNTER — Telehealth: Payer: Self-pay

## 2022-03-01 ENCOUNTER — Ambulatory Visit: Payer: 59 | Admitting: Family Medicine

## 2022-03-01 VITALS — BP 147/84 | HR 79 | Ht 61.0 in | Wt 208.0 lb

## 2022-03-01 DIAGNOSIS — G43701 Chronic migraine without aura, not intractable, with status migrainosus: Secondary | ICD-10-CM

## 2022-03-01 MED ORDER — NURTEC 75 MG PO TBDP
75.0000 mg | ORAL_TABLET | Freq: Every day | ORAL | 11 refills | Status: DC | PRN
  Filled 2022-03-01: qty 8, 30d supply, fill #0
  Filled 2022-06-21: qty 8, 30d supply, fill #1

## 2022-03-01 MED ORDER — AJOVY 225 MG/1.5ML ~~LOC~~ SOAJ
225.0000 mg | SUBCUTANEOUS | 3 refills | Status: DC
  Filled 2022-03-01: qty 4.5, 90d supply, fill #0
  Filled 2022-04-06: qty 1.5, 30d supply, fill #0
  Filled 2022-06-21 – 2022-06-22 (×3): qty 1.5, 30d supply, fill #1
  Filled 2022-08-07 – 2022-09-26 (×3): qty 1.5, 30d supply, fill #2
  Filled 2022-12-20: qty 4.5, 90d supply, fill #3

## 2022-03-01 NOTE — Telephone Encounter (Addendum)
Called and offered pt to come in early per Amy, NP.  ?She can be double booked at 11:30 or 1:30-2:30pm. ?If neither times work, let us know and we can find another time slot before 3pm. Provider needing to leave early.  ?

## 2022-03-01 NOTE — Telephone Encounter (Signed)
Called pt; results and provider recommendation reviewed. ?

## 2022-03-09 ENCOUNTER — Ambulatory Visit: Payer: 59 | Admitting: Family Medicine

## 2022-03-14 ENCOUNTER — Other Ambulatory Visit (HOSPITAL_COMMUNITY): Payer: Self-pay

## 2022-03-14 NOTE — Progress Notes (Addendum)
Cardiology Office Note:    Date:  03/15/2022   ID:  [name], DOB 22-Mar-1966, MRN [id number]  PCP:  Kendra Foster, MD   [facility] Providers Cardiologist:  None     Referring MD: Kendra La, MD   No chief complaint on file. SOB  History of Present Illness:    [name] is a 56 y.o. female with a hx of COPD, anxiety, HTN, OSA on CPAP, migraines, referral for SOB and chest pain  Seen by NP [name] on 5/4 for migraines. She notes progressive SOB. This can come on with ordinary activities. She can also feel chest pressure. She's had a lot of stress; she is taking care of her grandchildren because their mother passed away.. No orthopnea or PND. No LE edema. She follows with pulmonology for asthma and on a biologic, dupilumab.  Saw Dr. Debara Kelly in 01/2019 for LE edema. Thought related to venous insufficiency. She was continued on low dose lasix.  Cardiology Studies TTE 12/30/2020- EF 60-65, Grade I DD, E/e' < 14.  RVSP is normal. No valve disease  Past Medical History:  Diagnosis Date   Allergy    Anemia    Anxiety    Arthritis    all over   Asthma    COPD (chronic obstructive pulmonary disease) (HCC)    Depression    Dysrhythmia    Irregular heat w/ asthma episodes and anxiety   GERD (gastroesophageal reflux disease)    Hyperlipidemia    Hypertension    Migraines    PONV (postoperative nausea and vomiting)    Pre-diabetes    Sleep apnea    uses CPAP   Urinary incontinence    frequent urination- tx w/ vesicare   Urinary urgency     Past Surgical History:  Procedure Laterality Date   ABDOMINAL HYSTERECTOMY Bilateral 08/15/2017   Procedure: SUPRACERVICAL HYSTERECTOMY ABDOMINAL W/ BILATERAL SALPINGECTOMY AND LEFT OOPHORECTOMY;  Surgeon: Kendra Oman, MD;  Location: [facility];  Service: Gynecology;  Laterality: Bilateral;   ABLATION     CESAREAN SECTION     x3   DILATION AND CURETTAGE OF UTERUS  05/31/2005   MAB, TAB x 2   HERNIA REPAIR  1990's    HYSTEROSCOPY W/ ENDOMETRIAL ABLATION     LEEP     MAB     x 4   MOUTH SURGERY     teeth extraction   TUBAL LIGATION  11/01/2006   WISDOM TOOTH EXTRACTION      Current Medications: Current Meds  Medication Sig   albuterol (VENTOLIN HFA) 108 (90 Base) MCG/ACT inhaler Inhale 2 puffs into the lungs every 6 (six) hours as needed for wheezing or shortness of breath.   azelastine (ASTELIN) 0.1 % nasal spray Place 1 spray into both nostrils 2 (two) times daily. Use in each nostril as directed   benzonatate (TESSALON) 100 MG capsule Take 1 capsule (100 mg total) by mouth 2 (two) times daily as needed for cough.   BIOTIN PO Take 2,000 mg by mouth daily.   blood glucose meter kit and supplies KIT Use as directed.   Budeson-Glycopyrrol-Formoterol (BREZTRI AEROSPHERE) 160-9-4.8 MCG/ACT AERO Inhale 2 puffs into the lungs in the morning and at bedtime.   cetirizine (ZYRTEC ALLERGY) 10 MG tablet Take 1 tablet (10 mg total) by mouth daily as needed for allergies or rhinitis.   cetirizine (ZYRTEC) 10 MG tablet Take 1 tablet (10 mg total) by mouth daily.   Dupilumab (DUPIXENT) 300 MG/2ML  SOPN Inject 300 mg into the skin every 14 (fourteen) days. Loading dose of 685m received on 11/07/21   Elastic Bandages & Supports (SHOULDER BRACE LARGE) MISC 1 each by Does not apply route as needed.   esomeprazole (NEXIUM) 40 MG capsule Take 1 capsule (40 mg total) by mouth daily.   estradiol (ESTRACE VAGINAL) 0.1 MG/GM vaginal cream Place 1 g vaginally daily for 2 weeks, then use twice weekly thereafter.   fluorometholone (FML) 0.1 % ophthalmic suspension Place 1 drop into both eyes in the morning and at bedtime.   fluticasone (FLONASE) 50 MCG/ACT nasal spray Place 2 sprays into both nostrils daily.   Fremanezumab-vfrm (AJOVY) 225 MG/1.5ML SOAJ Inject 225 mg into the skin every 30 (thirty) days.   gabapentin (NEURONTIN) 300 MG capsule Take 1 capsule (300 mg total) by mouth every 8 (eight) hours as needed for pain    glucose blood test strip Use as directed 2 times daily.   Lancets (FREESTYLE) lancets Use as directed 2 times daily.   levalbuterol (XOPENEX) 1.25 MG/3ML nebulizer solution Take 1.25 mg by nebulization every 8 (eight) hours as needed for wheezing or shortness of breath.   losartan-hydrochlorothiazide (HYZAAR) 100-25 MG tablet Take 1 tablet by mouth daily.   Multiple Vitamin (MULTIVITAMIN WITH MINERALS) TABS tablet Take 1 tablet by mouth daily.   naproxen (NAPROSYN) 500 MG tablet Take 1 tablet (500 mg total) by mouth 2 (two) times daily as needed for mild or moderate pain   oxybutynin (DITROPAN-XL) 10 MG 24 hr tablet Take 1 tablet (10 mg total) by mouth at bedtime.   Polyethyl Glycol-Propyl Glycol (SYSTANE OP) Place 1 drop into both eyes in the morning and at bedtime.   Pyridoxine HCl (VITAMIN B-6 PO) Take 1 tablet by mouth daily. Unknown OTC strength   Rimegepant Sulfate (NURTEC) 75 MG TBDP Take 75 mg by mouth daily as needed (take for abortive therapy of migraine, no more than 1 tablet in 24 hours or 10 per month).   rosuvastatin (CRESTOR) 5 MG tablet Take 1 tablet (5 mg total) by mouth daily.   sertraline (ZOLOFT) 50 MG tablet Take 1 tablet (50 mg total) by mouth daily.   Spacer/Aero-Holding Chambers (AEROCHAMBER MV) inhaler Use as instructed   vitamin C (ASCORBIC ACID) 500 MG tablet Take 500 mg by mouth daily.    Current Facility-Administered Medications for the 03/15/22 encounter (Office Visit) with BJanina Mayo MD  Medication   0.9 %  sodium chloride infusion     Allergies:   Amitriptyline, Fluticasone-salmeterol, Imitrex [sumatriptan], Penicillins, Ciprofloxacin, Macrobid [nitrofurantoin monohyd macro], and Sulfamethoxazole-trimethoprim   Social History   Socioeconomic History   Marital status: Married    Spouse name: Not on file   Number of children: 5   Years of education: Not on file   Highest education level: High school graduate  Occupational History   Not on file   Tobacco Use   Smoking status: Former    Packs/day: 0.25    Years: 15.00    Pack years: 3.75    Types: Cigarettes    Quit date: 02/16/2003    Years since quitting: 19.0   Smokeless tobacco: Never  Vaping Use   Vaping Use: Never used  Substance and Sexual Activity   Alcohol use: Yes    Alcohol/week: 3.0 standard drinks    Types: 3 Glasses of wine per week   Drug use: No   Sexual activity: Yes    Partners: Male    Birth control/protection: Surgical  Other Topics Concern   Not on file  Social History Narrative   Lives at home with husband    Right handed   Caffeine: none         ** Merged History Encounter **       Social Determinants of Health   Financial Resource Strain: Not on file  Food Insecurity: Not on file  Transportation Needs: Not on file  Physical Activity: Not on file  Stress: Not on file  Social Connections: Not on file     Family History: The patient's family history includes Colon cancer in her maternal grandfather; Diabetes in her maternal grandmother. There is no history of Anesthesia problems, Headache, Aneurysm, Esophageal cancer, Rectal cancer, Stomach cancer, or Migraines.  ROS:   Please see the history of present illness.     All other systems reviewed and are negative.  EKGs/Labs/Other Studies Reviewed:    The following studies were reviewed today:   EKG:  EKG is  ordered today.  The ekg ordered today demonstrates   03/15/2022- NSR, no ischemic changes  Recent Labs: 11/27/2021: TSH 2.210 01/01/2022: ALT 20; BUN 24; Creatinine, Ser 1.36; Hemoglobin 10.9; Platelets 352; Potassium 4.6; Sodium 138  Recent Lipid Panel    Component Value Date/Time   CHOL 214 (H) 06/05/2021 1332   TRIG 151 (H) 06/05/2021 1332   HDL 57 06/05/2021 1332   CHOLHDL 3.8 06/05/2021 1332   CHOLHDL 3.9 08/16/2015 1106   VLDL 24 08/16/2015 1106   LDLCALC 130 (H) 06/05/2021 1332     Risk Assessment/Calculations:           Physical Exam:    VS:  Vitals:    03/15/22 1440  BP: 132/74  Pulse: 79  SpO2: 95%       BP 132/74   Pulse 79   Ht 5' 1" (1.549 m)   Wt 205 lb 12.8 oz (93.4 kg)   LMP 05/21/2017 (Exact Date)   SpO2 95%   BMI 38.89 kg/m     Wt Readings from Last 3 Encounters:  03/15/22 205 lb 12.8 oz (93.4 kg)  03/01/22 208 lb (94.3 kg)  02/23/22 207 lb 1.6 oz (93.9 kg)     GEN:  Well nourished, well developed in no acute distress HEENT: Normal NECK: No JVD; No carotid bruits LYMPHATICS: No lymphadenopathy CARDIAC: RRR, no murmurs, rubs, gallops RESPIRATORY:  Clear to auscultation without rales, wheezing or rhonchi  ABDOMEN: Soft, non-tender, non-distended MUSCULOSKELETAL:  No edema; No deformity  SKIN: Warm and dry NEUROLOGIC:  Alert and oriented x 3 PSYCHIATRIC:  near tearful  ASSESSMENT:    Dyspnea: She notes dyspnea with mild activity. She has no signs of CHF or valve disease. She has some risk factors for CV disease, also would like to rule out stress cardiomyopathy, will plan for a stress echo.  PLAN:    In order of problems listed above:  Stress echo Follow up pending results        Medication Adjustments/Labs and Tests Ordered: Current medicines are reviewed at length with the patient today.  Concerns regarding medicines are outlined above.  Orders Placed This Encounter  Procedures   EKG 12-Lead   ECHOCARDIOGRAM STRESS TEST   No orders of the defined types were placed in this encounter.   Patient Instructions  Medication Instructions:  Your physician recommends that you continue on your current medications as directed. Please refer to the Current Medication list given to you today.  Testing/Procedures: Your physician has requested that you  have a stress echocardiogram. For further information please visit [email or web address]. Please follow instruction sheet as given.  Follow-Up: At [facility], you and your health needs are our priority.  As part of our continuing mission to provide you with  exceptional heart care, we have created designated Provider Care Teams.  These Care Teams include your primary Cardiologist (physician) and Advanced Practice Providers (APPs -  Physician Assistants and Nurse Practitioners) who all work together to provide you with the care you need, when you need it.  We recommend signing up for the patient portal called "MyChart".  Sign up information is provided on this After Visit Summary.  MyChart is used to connect with patients for Virtual Visits (Telemedicine).  Patients are able to view lab/test results, encounter notes, upcoming appointments, etc.  Non-urgent messages can be sent to your provider as well.   To learn more about what you can do with MyChart, go to ht  Important Information About Sugar         Signed, Kendra Mayo, MD  03/15/2022 3:08 PM    [place]

## 2022-03-15 ENCOUNTER — Ambulatory Visit: Payer: 59 | Admitting: Internal Medicine

## 2022-03-15 ENCOUNTER — Encounter: Payer: Self-pay | Admitting: Internal Medicine

## 2022-03-15 VITALS — BP 132/74 | HR 79 | Ht 61.0 in | Wt 205.8 lb

## 2022-03-15 DIAGNOSIS — R0602 Shortness of breath: Secondary | ICD-10-CM

## 2022-03-15 DIAGNOSIS — R079 Chest pain, unspecified: Secondary | ICD-10-CM | POA: Diagnosis not present

## 2022-03-15 NOTE — Patient Instructions (Addendum)
Medication Instructions:  Your physician recommends that you continue on your current medications as directed. Please refer to the Current Medication list given to you today.  Testing/Procedures: Your physician has requested that you have a stress echocardiogram. For further information please visit [email or web address]. Please follow instruction sheet as given.  Follow-Up: At [facility], you and your health needs are our priority.  As part of our continuing mission to provide you with exceptional heart care, we have created designated Provider Care Teams.  These Care Teams include your primary Cardiologist (physician) and Advanced Practice Providers (APPs -  Physician Assistants and Nurse Practitioners) who all work together to provide you with the care you need, when you need it.  We recommend signing up for the patient portal called "MyChart".  Sign up information is provided on this After Visit Summary.  MyChart is used to connect with patients for Virtual Visits (Telemedicine).  Patients are able to view lab/test results, encounter notes, upcoming appointments, etc.  Non-urgent messages can be sent to your provider as well.   To learn more about what you can do with MyChart, go to [email or web address].    Your next appointment:   AS NEEDED with Dr. Harl Bowie    Important Information About Sugar

## 2022-03-19 NOTE — Progress Notes (Deleted)
    SUBJECTIVE:   CHIEF COMPLAINT / HPI: HTN f/u and pre-DM labs   Patient presents for follow up for HTN and for hemoglobin A1c and lipid panel.  She reports ***  She continues to reports adherence to hyzaar medication regimen   PERTINENT  PMH / PSH:  HTN  Asthma   OBJECTIVE:   LMP 05/21/2017 (Exact Date)   Physical Exam  ASSESSMENT/PLAN:   No problem-specific Assessment & Plan notes found for this encounter.     Eulis Foster, MD [place]

## 2022-03-23 ENCOUNTER — Ambulatory Visit (INDEPENDENT_AMBULATORY_CARE_PROVIDER_SITE_OTHER): Payer: 59 | Admitting: Family Medicine

## 2022-03-23 DIAGNOSIS — Z91199 Patient's noncompliance with other medical treatment and regimen due to unspecified reason: Secondary | ICD-10-CM

## 2022-03-25 NOTE — Progress Notes (Signed)
No show for appt. 

## 2022-03-28 ENCOUNTER — Other Ambulatory Visit (HOSPITAL_COMMUNITY): Payer: Self-pay

## 2022-04-03 NOTE — Progress Notes (Signed)
    SUBJECTIVE:   CHIEF COMPLAINT / HPI: HTN,A1c  HTN  Hypertension: - Medications: losartan-hctz combo  - Compliance: reports regular adherence  - Checking BP at home: similar to 120s - Denies any vision changes, LE edema, medication SEs, or symptoms of hypotension - Diet: avoiding fried foods  - Exercise: none   Diabetes Current Regimen: diet controlled, baked chicken, grilled foods; does not fry foods  CBGs: 110-120  Last A1c:  Lab Results  Component Value Date   HGBA1C 6.9 04/04/2022    Denies polyuria, polydipsia, hypoglycemia  Last Eye Exam: goes to [facility], planning appt in next few weeks  Statin: crestor  ACE/ARB: losartan    PERTINENT  PMH / PSH:  HTN  DM    OBJECTIVE:   BP 121/79   Pulse 67   Ht '5\' 1"'$  (1.549 m)   Wt 205 lb (93 kg)   LMP 05/21/2017 (Exact Date)   SpO2 99%   BMI 38.73 kg/m   Physical Exam Constitutional:      Appearance: She is obese.  HENT:     Right Ear: External ear normal.     Left Ear: External ear normal.     Nose: Nose normal.     Mouth/Throat:     Pharynx: Oropharynx is clear.  Cardiovascular:     Rate and Rhythm: Normal rate and regular rhythm.     Heart sounds: Normal heart sounds.  Pulmonary:     Effort: Pulmonary effort is normal.     Breath sounds: No wheezing or rales.  Abdominal:     General: Bowel sounds are normal.     Palpations: Abdomen is soft.  Musculoskeletal:        General: No swelling or deformity.     Cervical back: Normal range of motion.     Right lower leg: No edema.     Left lower leg: No edema.  Skin:    General: Skin is warm.  Neurological:     General: No focal deficit present.     Mental Status: She is alert and oriented to person, place, and time.     ASSESSMENT/PLAN:   HTN (hypertension) BP well controlled  Continue losartan-hctz   Will collect BMP today   Hyperlipidemia due to type 2 diabetes mellitus ([place]) Will recheck lipid panel today  Patient to continue  rosuvastatin   Type 2 diabetes mellitus with hyperglycemia ([place]) Check Hgb A1c today  Continue with diet management for DM  Patient previously unable to tolerate jardiance or metformin      Eulis Foster, MD [place]

## 2022-04-04 ENCOUNTER — Other Ambulatory Visit (HOSPITAL_COMMUNITY): Payer: Self-pay

## 2022-04-04 ENCOUNTER — Ambulatory Visit: Payer: 59 | Admitting: Family Medicine

## 2022-04-04 ENCOUNTER — Encounter: Payer: Self-pay | Admitting: Family Medicine

## 2022-04-04 VITALS — BP 121/79 | HR 67 | Ht 61.0 in | Wt 205.0 lb

## 2022-04-04 DIAGNOSIS — J45901 Unspecified asthma with (acute) exacerbation: Secondary | ICD-10-CM | POA: Diagnosis not present

## 2022-04-04 DIAGNOSIS — I10 Essential (primary) hypertension: Secondary | ICD-10-CM

## 2022-04-04 DIAGNOSIS — F418 Other specified anxiety disorders: Secondary | ICD-10-CM | POA: Diagnosis not present

## 2022-04-04 DIAGNOSIS — E785 Hyperlipidemia, unspecified: Secondary | ICD-10-CM | POA: Diagnosis not present

## 2022-04-04 DIAGNOSIS — E1165 Type 2 diabetes mellitus with hyperglycemia: Secondary | ICD-10-CM | POA: Diagnosis not present

## 2022-04-04 DIAGNOSIS — G4733 Obstructive sleep apnea (adult) (pediatric): Secondary | ICD-10-CM | POA: Diagnosis not present

## 2022-04-04 DIAGNOSIS — E1169 Type 2 diabetes mellitus with other specified complication: Secondary | ICD-10-CM | POA: Diagnosis not present

## 2022-04-04 LAB — POCT GLYCOSYLATED HEMOGLOBIN (HGB A1C): HbA1c, POC (controlled diabetic range): 6.9 % (ref 0.0–7.0)

## 2022-04-04 MED ORDER — FLUTICASONE PROPIONATE 50 MCG/ACT NA SUSP
2.0000 | Freq: Every day | NASAL | 6 refills | Status: DC
  Filled 2022-04-04: qty 16, 30d supply, fill #0

## 2022-04-04 MED ORDER — SERTRALINE HCL 50 MG PO TABS
50.0000 mg | ORAL_TABLET | Freq: Every day | ORAL | 1 refills | Status: DC
  Filled 2022-04-04: qty 90, 90d supply, fill #0
  Filled 2022-06-21: qty 90, 90d supply, fill #1

## 2022-04-04 MED ORDER — ESOMEPRAZOLE MAGNESIUM 40 MG PO CPDR
40.0000 mg | DELAYED_RELEASE_CAPSULE | Freq: Every day | ORAL | 1 refills | Status: DC
  Filled 2022-04-04 – 2022-06-21 (×2): qty 90, 90d supply, fill #0

## 2022-04-04 MED ORDER — NAPROXEN 500 MG PO TABS
500.0000 mg | ORAL_TABLET | Freq: Two times a day (BID) | ORAL | 1 refills | Status: DC | PRN
  Filled 2022-04-04: qty 90, 45d supply, fill #0
  Filled 2022-06-21: qty 90, 45d supply, fill #1

## 2022-04-04 MED ORDER — LOSARTAN POTASSIUM-HCTZ 100-25 MG PO TABS
1.0000 | ORAL_TABLET | Freq: Every day | ORAL | 1 refills | Status: DC
  Filled 2022-04-04 – 2022-06-21 (×2): qty 90, 90d supply, fill #0

## 2022-04-04 MED ORDER — ROSUVASTATIN CALCIUM 5 MG PO TABS
5.0000 mg | ORAL_TABLET | Freq: Every day | ORAL | 1 refills | Status: DC
  Filled 2022-04-04 (×2): qty 30, 30d supply, fill #0

## 2022-04-04 MED ORDER — OXYBUTYNIN CHLORIDE ER 15 MG PO TB24
15.0000 mg | ORAL_TABLET | Freq: Every day | ORAL | 1 refills | Status: DC
  Filled 2022-04-04: qty 90, 90d supply, fill #0
  Filled 2022-06-21: qty 90, 90d supply, fill #1

## 2022-04-04 NOTE — Patient Instructions (Signed)
Today we checked a hemoglobin A1c.  I have not received the results at this time.  I am happy to hear that your blood pressure has improved.  Please continue taking medications as prescribed  I have provided refills for your medicines.  Today we will check your blood work for your cholesterol and kidney enzymes as well as electrolytes.

## 2022-04-05 LAB — LIPID PANEL
Chol/HDL Ratio: 4.1 ratio (ref 0.0–4.4)
Cholesterol, Total: 203 mg/dL — ABNORMAL HIGH (ref 100–199)
HDL: 50 mg/dL (ref >39)
LDL Chol Calc (NIH): 123 mg/dL — ABNORMAL HIGH (ref 0–99)
Triglycerides: 171 mg/dL — ABNORMAL HIGH (ref 0–149)
VLDL Cholesterol Cal: 30 mg/dL (ref 5–40)

## 2022-04-05 LAB — BASIC METABOLIC PANEL
BUN/Creatinine Ratio: 16 (ref 9–23)
BUN: 14 mg/dL (ref 6–24)
CO2: 25 mmol/L (ref 20–29)
Calcium: 9.8 mg/dL (ref 8.7–10.2)
Chloride: 101 mmol/L (ref 96–106)
Creatinine, Ser: 0.87 mg/dL (ref 0.57–1.00)
Glucose: 138 mg/dL — ABNORMAL HIGH (ref 70–99)
Potassium: 3.9 mmol/L (ref 3.5–5.2)
Sodium: 140 mmol/L (ref 134–144)
eGFR: 79 mL/min/{1.73_m2} (ref >59)

## 2022-04-06 ENCOUNTER — Other Ambulatory Visit (HOSPITAL_COMMUNITY): Payer: Self-pay

## 2022-04-06 NOTE — Assessment & Plan Note (Signed)
BP well controlled  Continue losartan-hctz   Will collect BMP today

## 2022-04-06 NOTE — Assessment & Plan Note (Signed)
Check Hgb A1c today  Continue with diet management for DM  Patient previously unable to tolerate jardiance or metformin

## 2022-04-06 NOTE — Assessment & Plan Note (Signed)
Will recheck lipid panel today  Patient to continue rosuvastatin

## 2022-04-10 ENCOUNTER — Telehealth (HOSPITAL_COMMUNITY): Payer: Self-pay | Admitting: *Deleted

## 2022-04-10 NOTE — Telephone Encounter (Signed)
Left message on voicemail per DPR in reference to upcoming appointment scheduled on 04/12/2022 at 2:00 with detailed instructions given per Stress Test Requisition Sheet for the test. LM to arrive 30 minutes early, and that it is imperative to arrive on time for appointment to keep from having the test rescheduled. If you need to cancel or reschedule your appointment, please call the office within 24 hours of your appointment. Failure to do so may result in a cancellation of your appointment, and a $50 no show fee. Phone number given for call back for any questions. [name]

## 2022-04-11 ENCOUNTER — Other Ambulatory Visit: Payer: Self-pay | Admitting: Family Medicine

## 2022-04-11 DIAGNOSIS — E1165 Type 2 diabetes mellitus with hyperglycemia: Secondary | ICD-10-CM

## 2022-04-11 MED ORDER — ROSUVASTATIN CALCIUM 10 MG PO TABS
10.0000 mg | ORAL_TABLET | Freq: Every day | ORAL | 1 refills | Status: DC
  Filled 2022-04-11: qty 90, 90d supply, fill #0
  Filled 2022-06-21: qty 90, 90d supply, fill #1

## 2022-04-12 ENCOUNTER — Ambulatory Visit (HOSPITAL_COMMUNITY): Payer: 59 | Attending: Internal Medicine

## 2022-04-12 ENCOUNTER — Ambulatory Visit (HOSPITAL_COMMUNITY): Payer: 59

## 2022-04-12 ENCOUNTER — Other Ambulatory Visit (HOSPITAL_COMMUNITY): Payer: 59

## 2022-04-12 ENCOUNTER — Other Ambulatory Visit (HOSPITAL_COMMUNITY): Payer: Self-pay

## 2022-04-12 DIAGNOSIS — R079 Chest pain, unspecified: Secondary | ICD-10-CM

## 2022-04-12 DIAGNOSIS — R0602 Shortness of breath: Secondary | ICD-10-CM

## 2022-04-12 MED ORDER — PERFLUTREN LIPID MICROSPHERE
1.0000 mL | INTRAVENOUS | Status: AC | PRN
  Administered 2022-04-12: 3 mL via INTRAVENOUS

## 2022-04-18 ENCOUNTER — Other Ambulatory Visit (HOSPITAL_COMMUNITY): Payer: Self-pay

## 2022-04-18 ENCOUNTER — Ambulatory Visit: Payer: 59 | Admitting: Family Medicine

## 2022-04-18 VITALS — BP 124/75 | HR 75 | Temp 98.1°F | Ht 61.0 in | Wt 207.0 lb

## 2022-04-18 DIAGNOSIS — L304 Erythema intertrigo: Secondary | ICD-10-CM

## 2022-04-18 MED ORDER — NYSTATIN 100000 UNIT/GM EX POWD
1.0000 | Freq: Three times a day (TID) | CUTANEOUS | 0 refills | Status: DC
  Filled 2022-04-18: qty 60, 20d supply, fill #0

## 2022-04-18 NOTE — Progress Notes (Signed)
    SUBJECTIVE:   CHIEF COMPLAINT / HPI: pruritis   Patient presents with 3 days of itching in the area of her bra.  She reports that she is itching especially beneath her breasts in the form and it wraps around to her back.  She states that she had this issue 2 years ago and was prescribed a course of prednisone however she is hesitant to take prednisone at this time due to the recent diagnosis of diabetes.  She states that she has tried hydrocortisone topical with no relief to her itching.  She denies introduction of new foods at the time of the itching.  She states that she uses plain Dove soap and arm and [employer] detergent.  She states that she has not changed either of these soaps/detergents in the last several years.  She denies any new medications or dietary supplements.  She has not noticed a rash in the areas of itching except for a hyperpigmented dry patch on her right pectoral region.  PERTINENT  PMH / PSH:  Obesity Type 2 diabetes Asthma Hypertension OSA Hyperlipidemia   OBJECTIVE:   BP 124/75   Pulse 75   Temp 98.1 F (36.7 C)   Ht '5\' 1"'$  (1.549 m)   Wt 207 lb (93.9 kg)   LMP 05/21/2017 (Exact Date)   SpO2 97%   BMI 39.11 kg/m   Physical Exam Skin:    General: Skin is warm and moist.     Findings: Erythema and rash present. No lesion or wound.          Comments: Small area of erythema in breast cleavage for referral Erythema, mild under bra line      ASSESSMENT/PLAN:   Pruritic intertrigo Exam and history consistent with pruritic intertrigo.  Patient has risk factors including diabetes and obesity.  Areas beneath Bra are involved which increases my suspicion further.  Will treat with nystatin powder application 2-3 times per day for the next 7 to 10 days.  Patient counseled to return if itching persists beyond treatment.  Recommended that patient keep area cool and dry and not use clothing in those areas that are irritated.     Eulis Foster, MD [place]

## 2022-04-18 NOTE — Assessment & Plan Note (Signed)
Exam and history consistent with pruritic intertrigo.  Patient has risk factors including diabetes and obesity.  Areas beneath Bra are involved which increases my suspicion further.  Will treat with nystatin powder application 2-3 times per day for the next 7 to 10 days.  Patient counseled to return if itching persists beyond treatment.  Recommended that patient keep area cool and dry and not use clothing in those areas that are irritated.

## 2022-04-18 NOTE — Patient Instructions (Addendum)
I have prescribed a antifungal powder for you to apply 2-3 times per day to the areas that are  itching for the next 7-10 days.  Please return to care after completion of this powder application if the itching persists.  It will be important to keep your skin cool and dry as possible.  I recommend going without fetid clothing over the areas that are particularly bothersome for you as long as possible throughout the day over the course of your treatment.   Intertrigo Intertrigo is skin irritation (inflammation) that happens in warm, moist areas of the body. The irritation can cause a rash and make skin raw and itchy. The rash is usually pink or red. It happens mostly between folds of skin or where skin rubs together, such as: Between the toes. In the armpits. In the groin area. Under the belly. Under the breasts. Around the butt area. This condition is not passed from person to person (is not contagious). What are the causes? Heat, moisture, rubbing, and not enough air movement. The condition can be made worse by: Sweat. Bacteria. A fungus, such as yeast. What increases the risk? Moisture in your skin folds. You are more likely to develop this condition if you: Have diabetes. Are overweight. Are not able to move around. Live in a warm and moist climate. Wear splints, braces, or other medical devices. Are not able to control your pee (urine) or poop (stool). What are the signs or symptoms? A pink or red skin rash in the skin fold or near the skin fold. Raw or scaly skin. Itching. A burning feeling. Bleeding. Leaking fluid. A bad smell. How is this treated? Cleaning and drying your skin. Using an antifungal cream on your skin or taking pills for an infection that was caused by a fungus, such as yeast. Using a steroid ointment to stop the itching and irritation. Separating the skin fold with a clean cotton cloth to absorb moisture and allow air to flow into the area. Follow  these instructions at home: Keep the affected area clean and dry. Do not scratch your skin. Stay cool as much as you can. Use an air conditioner or a fan, if you have one. Apply over-the-counter and prescription medicines only as told by your doctor. If you were prescribed an antibiotic medicine, use it as told by your doctor. Do not stop using the antibiotic even if your condition starts to get better. Keep all follow-up visits as told by your doctor. This is important. How is this prevented?  Stay at a healthy weight. Take care of your feet. This is very important if you have diabetes. You should: Wear shoes that fit well. Keep your feet dry. Wear clean cotton or wool socks. Protect the skin in your groin and butt area as told by your doctor. To do this: Follow a regular cleaning routine. Use creams, powders, or ointments that protect your skin. Change protection pads often. Do not wear tight clothes. Wear clothes that: Are loose. Take moisture away from your body. Are made of cotton. Wear a bra that gives good support, if needed. Shower and dry yourself well after being active. Use a hair dryer on a cool setting to dry between skin folds. Keep your blood sugar under control if you have diabetes. Contact a doctor if: Your symptoms do not get better with treatment. Your symptoms get worse or they spread. You notice more redness and warmth. You have a fever. Summary Intertrigo is skin irritation that occurs  when folds of skin rub together. This condition is caused by heat, moisture, and rubbing. This condition may be treated by cleaning and drying your skin and with medicines. Apply over-the-counter and prescription medicines only as told by your doctor. Keep all follow-up visits as told by your doctor. This is important. This information is not intended to replace advice given to you by your health care provider. Make sure you discuss any questions you have with your health care  provider. Document Revised: 07/31/2021 Document Reviewed: 07/31/2021 Elsevier Patient Education  [place].

## 2022-04-20 ENCOUNTER — Other Ambulatory Visit (HOSPITAL_COMMUNITY): Payer: Self-pay

## 2022-04-23 ENCOUNTER — Other Ambulatory Visit (HOSPITAL_COMMUNITY): Payer: Self-pay

## 2022-05-08 ENCOUNTER — Other Ambulatory Visit (HOSPITAL_COMMUNITY): Payer: Self-pay

## 2022-05-09 ENCOUNTER — Ambulatory Visit: Payer: 59 | Admitting: Family Medicine

## 2022-05-09 ENCOUNTER — Other Ambulatory Visit (HOSPITAL_COMMUNITY): Payer: Self-pay

## 2022-05-09 VITALS — BP 155/78 | HR 58 | Ht 61.0 in | Wt 208.2 lb

## 2022-05-09 DIAGNOSIS — R300 Dysuria: Secondary | ICD-10-CM | POA: Diagnosis not present

## 2022-05-09 DIAGNOSIS — E785 Hyperlipidemia, unspecified: Secondary | ICD-10-CM

## 2022-05-09 DIAGNOSIS — E1169 Type 2 diabetes mellitus with other specified complication: Secondary | ICD-10-CM

## 2022-05-09 LAB — POCT URINALYSIS DIP (MANUAL ENTRY)
Bilirubin, UA: NEGATIVE
Blood, UA: NEGATIVE
Glucose, UA: NEGATIVE mg/dL
Ketones, POC UA: NEGATIVE mg/dL
Leukocytes, UA: NEGATIVE
Nitrite, UA: NEGATIVE
Protein Ur, POC: NEGATIVE mg/dL
Spec Grav, UA: 1.025 (ref 1.010–1.025)
Urobilinogen, UA: 0.2 E.U./dL
pH, UA: 5.5 (ref 5.0–8.0)

## 2022-05-09 MED ORDER — CEPHALEXIN 500 MG PO CAPS
500.0000 mg | ORAL_CAPSULE | Freq: Two times a day (BID) | ORAL | 0 refills | Status: DC
  Filled 2022-05-09: qty 14, 7d supply, fill #0

## 2022-05-09 MED ORDER — NYSTATIN 100000 UNIT/GM EX POWD
1.0000 | Freq: Three times a day (TID) | CUTANEOUS | 0 refills | Status: DC
  Filled 2022-05-09: qty 60, 20d supply, fill #0

## 2022-05-09 NOTE — Patient Instructions (Addendum)
It was nice seeing you today!  I will let you know if we need to change antibiotics.  Checking cholesterol level today.  Please give [place] a call if you develop any fever or chills.  Stay well, Zola Button, MD [place] [phone]  --  Make sure to check out at the front desk before you leave today.  Please arrive at least 15 minutes prior to your scheduled appointments.  If you had blood work today, I will send you a MyChart message or a letter if results are normal. Otherwise, I will give you a call.  If you had a referral placed, they will call you to set up an appointment. Please give [place] a call if you don't hear back in the next 2 weeks.  If you need additional refills before your next appointment, please call your pharmacy first.

## 2022-05-09 NOTE — Progress Notes (Unsigned)
    SUBJECTIVE:   CHIEF COMPLAINT / HPI:  Chief Complaint  Patient presents with   Back Pain    Patient reports urinary frequency and dysuria for about 5 days.  She reports associated chills.  She believes she is having a UTI as she has had UTIs in the past.  She is also having left-sided flank pain.  Denies fever.  Also wants to have her cholesterol levels checked.  Last visit noted to have high cholesterol.  Per chart she is on rosuvastatin 10 mg daily, she is unsure of the dose.  PERTINENT  PMH / PSH: HLD, depression, anxiety, T2DM, HTN  Patient Care Team: Arlyce Dice, MD as PCP - General (Family Medicine)   OBJECTIVE:   BP (!) 155/78   Pulse (!) 58   Ht '5\' 1"'$  (1.549 m)   Wt 208 lb 3.2 oz (94.4 kg)   LMP 05/21/2017 (Exact Date)   SpO2 98%   BMI 39.34 kg/m   Physical Exam Constitutional:      General: She is not in acute distress. Cardiovascular:     Rate and Rhythm: Normal rate and regular rhythm.  Pulmonary:     Effort: Pulmonary effort is normal. No respiratory distress.     Breath sounds: Normal breath sounds.  Abdominal:     Tenderness: There is no right CVA tenderness.     Comments: Mild left CVA tenderness  Musculoskeletal:     Cervical back: Neck supple.  Neurological:     Mental Status: She is alert.         04/04/2022    1:59 PM  Depression screen PHQ 2/9  Decreased Interest 0  Down, Depressed, Hopeless 0  PHQ - 2 Score 0  Altered sleeping 1  Tired, decreased energy 1  Change in appetite 0  Feeling bad or failure about yourself  0  Trouble concentrating 0  Moving slowly or fidgety/restless 0  Suicidal thoughts 0  PHQ-9 Score 2     {Show previous vital signs (optional):23777}    ASSESSMENT/PLAN:   Dysuria High clinical suspicion for UTI based on symptoms.  She is having some mild CVA tenderness but no fevers, do not feel she needs hospitalization for pyelonephritis.  UA is unremarkable; however, she had E. coli UTI 1 year prior shown on  urine culture with normal UA. - cephalexin 500 mg BID x 7d - f/u urine culture  Hyperlipidemia due to type 2 diabetes mellitus (HCC) Recent increase in rosuvastatin, will check LDL    Return if symptoms worsen or fail to improve.   Zola Button, MD [place]

## 2022-05-10 LAB — LDL CHOLESTEROL, DIRECT: LDL Direct: 111 mg/dL — ABNORMAL HIGH (ref 0–99)

## 2022-05-10 NOTE — Assessment & Plan Note (Signed)
Recent increase in rosuvastatin, will check LDL

## 2022-05-11 ENCOUNTER — Other Ambulatory Visit (HOSPITAL_COMMUNITY): Payer: Self-pay

## 2022-05-11 ENCOUNTER — Telehealth: Payer: Self-pay | Admitting: Family Medicine

## 2022-05-11 NOTE — Telephone Encounter (Signed)
-----   Message from Blane Ohara McDiarmid, MD sent at 05/09/2022 11:56 AM EDT -----  ----- Message ----- From: [place] Pink, CMA Sent: 05/09/2022   9:52 AM EDT To: Blane Ohara McDiarmid, MD

## 2022-05-11 NOTE — Telephone Encounter (Signed)
Pt notified that UA was neg, but urine cx grew gram neg rods (prelim read). Pt is taking Cephalexin as prescribed and denies dysuria. Also notified of lipid panel results and advised to continue current rosuvastatin.

## 2022-05-12 LAB — URINE CULTURE

## 2022-05-16 ENCOUNTER — Ambulatory Visit
Admission: RE | Admit: 2022-05-16 | Discharge: 2022-05-16 | Disposition: A | Discharge status: Home / Self Care | Payer: 59 | Source: Ambulatory Visit | Attending: Obstetrics and Gynecology | Admitting: Obstetrics and Gynecology

## 2022-05-16 DIAGNOSIS — Z01419 Encounter for gynecological examination (general) (routine) without abnormal findings: Secondary | ICD-10-CM

## 2022-05-16 DIAGNOSIS — Z1231 Encounter for screening mammogram for malignant neoplasm of breast: Secondary | ICD-10-CM | POA: Diagnosis not present

## 2022-05-22 ENCOUNTER — Other Ambulatory Visit (HOSPITAL_COMMUNITY): Payer: Self-pay

## 2022-05-30 ENCOUNTER — Other Ambulatory Visit: Payer: Self-pay | Admitting: Pharmacist

## 2022-05-30 ENCOUNTER — Other Ambulatory Visit (HOSPITAL_COMMUNITY): Payer: Self-pay

## 2022-05-30 ENCOUNTER — Telehealth: Payer: Self-pay | Admitting: Pulmonary Disease

## 2022-05-30 DIAGNOSIS — J4540 Moderate persistent asthma, uncomplicated: Secondary | ICD-10-CM

## 2022-05-30 MED ORDER — DUPIXENT 300 MG/2ML ~~LOC~~ SOAJ
300.0000 mg | SUBCUTANEOUS | 1 refills | Status: DC
  Filled 2022-05-30: qty 12, 84d supply, fill #0

## 2022-05-30 MED ORDER — DUPIXENT 300 MG/2ML ~~LOC~~ SOAJ
300.0000 mg | SUBCUTANEOUS | 1 refills | Status: DC
  Filled 2022-05-30: qty 12, 84d supply, fill #0
  Filled 2022-06-21: qty 4, 28d supply, fill #0
  Filled 2022-08-06: qty 4, 28d supply, fill #1
  Filled 2022-09-07: qty 4, 28d supply, fill #2

## 2022-05-30 NOTE — Telephone Encounter (Signed)
Dupixent has been sent to preferred pharmacy.  ATC patient- unable to leave vm due to mailbox being full.  Will call back.

## 2022-05-31 ENCOUNTER — Other Ambulatory Visit (HOSPITAL_COMMUNITY): Payer: Self-pay

## 2022-05-31 NOTE — Telephone Encounter (Signed)
Lm for patient.  Will close encounter per office protocol, as this is second attempt at contacting patient. Nothing further needed.

## 2022-06-04 ENCOUNTER — Telehealth: Payer: Self-pay

## 2022-06-04 NOTE — Telephone Encounter (Signed)
Patient calls nurse line regarding continued issues with irritation under breasts. Patient reports using nystatin powder, however, this has not helped.   This has been going on since 04/18/2022.  Advised patient that she would likely need to schedule follow up appointment for further evaluation. Patient declines at this time and would like message to be sent to doctor requesting alternative medication.   Forwarding to PCP.   Talbot Grumbling, RN

## 2022-06-08 ENCOUNTER — Other Ambulatory Visit (HOSPITAL_COMMUNITY): Payer: Self-pay

## 2022-06-08 ENCOUNTER — Telehealth: Payer: Self-pay | Admitting: Pulmonary Disease

## 2022-06-08 ENCOUNTER — Telehealth: Payer: Self-pay | Admitting: *Deleted

## 2022-06-08 NOTE — Telephone Encounter (Signed)
Called and spoke with patient, provided information regarding [place] per Margie:  Dupixent has been sent to preferred pharmacy  Patient verbalized understanding and said she had called the pharmacy and they are mailing it to her.  Nothing further needed.

## 2022-06-08 NOTE — Telephone Encounter (Signed)
Patient requesting a renewal of her handicap placard.  Advised I would fill out a form and have Dr. Patsey Kelly sign it.  She would like to receive a call when it is ready.  Advised we would call when it is ready for pick up.

## 2022-06-11 NOTE — Telephone Encounter (Signed)
Handicap placard form signed by Dr. Patsey Berthold.  Called and spoke with patient and let her know that the form was ready for pick up at the front desk.  She stated she would pick it up in the morning.  Nothing further needed.

## 2022-06-13 ENCOUNTER — Other Ambulatory Visit (HOSPITAL_COMMUNITY): Payer: Self-pay

## 2022-06-13 ENCOUNTER — Ambulatory Visit: Payer: 59 | Admitting: Student

## 2022-06-13 ENCOUNTER — Encounter: Payer: Self-pay | Admitting: Student

## 2022-06-13 VITALS — BP 137/80 | HR 81 | Temp 98.4°F | Ht 61.0 in | Wt 205.6 lb

## 2022-06-13 DIAGNOSIS — L304 Erythema intertrigo: Secondary | ICD-10-CM

## 2022-06-13 DIAGNOSIS — I10 Essential (primary) hypertension: Secondary | ICD-10-CM | POA: Diagnosis not present

## 2022-06-13 DIAGNOSIS — E1165 Type 2 diabetes mellitus with hyperglycemia: Secondary | ICD-10-CM

## 2022-06-13 MED ORDER — FLUCONAZOLE 150 MG PO TABS
150.0000 mg | ORAL_TABLET | ORAL | 0 refills | Status: AC
  Filled 2022-06-13: qty 4, 28d supply, fill #0

## 2022-06-13 MED ORDER — SEMAGLUTIDE(0.25 OR 0.5MG/DOS) 2 MG/3ML ~~LOC~~ SOPN
0.2500 mg | PEN_INJECTOR | SUBCUTANEOUS | 3 refills | Status: DC
  Filled 2022-06-13: qty 3, 42d supply, fill #0

## 2022-06-13 NOTE — Assessment & Plan Note (Signed)
Did not see benefit with nystatin powder, ordered systemic treatment Diflucan once weekly x4 weeks. 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

## 2022-06-13 NOTE — Progress Notes (Unsigned)
  SUBJECTIVE:   CHIEF COMPLAINT / HPI:   Intertrigo:  H/o intertrigo with treatment via nystatin powder.  Continues to be problematic for the patient  Painful and itchy regular although she tries to remain dry and cool.  It is really painful at work.  She has tried an oral medication in the past but is unsure which kind that seemed to help this before   DM2: Dietary management of diabetes currently, last A1C 6.9 on 06/23. Side effects from Metformin and Jardiance prevented her from taking it. CBGs 100s-110s. Patient expresses that she would like to lose weight if able. No lows.   PERTINENT  PMH / PSH:   HTN, DM2, obesity  OBJECTIVE:  BP 137/80   Pulse 81   Temp 98.4 F (36.9 C)   Ht 5\' 1"  (1.549 m)   Wt 205 lb 9.6 oz (93.3 kg)   LMP 05/21/2017 (Exact Date)   SpO2 97%   BMI 38.85 kg/m   General: NAD, pleasant, able to participate in exam Cardiac: RRR, no murmurs auscultated Respiratory: CTAB, normal WOB Abdomen: soft, non-tender, non-distended, normoactive bowel sounds Extremities: warm and well perfused, no edema or cyanosis Skin: warm and dry, erythematous areas under breast bilaterally without drainage or raised lesions  Neuro: alert, no obvious focal deficits, speech normal Psych: Normal affect and mood  ASSESSMENT/PLAN:  HTN (hypertension) BP: 137/80 today. Well controlled. Goal met. Continue to work on healthy dietary habits and exercise.      Pruritic intertrigo Did not see benefit with nystatin powder, ordered systemic treatment Diflucan once weekly x4 weeks. 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  Type 2 diabetes mellitus with hyperglycemia (HCC) stable - Last A1c:  Lab Results  Component Value Date   HGBA1C 6.9 04/04/2022   - Medications: Added Ozempic today for weight loss, failed metformin and Jardiance  - Statin: Crestor   - Checking BG at home - Recheck in 1 month  - Continue with diet and exercise     No orders of the defined types were placed in this encounter.  Meds ordered this encounter  Medications   fluconazole (DIFLUCAN) 150 MG tablet    Sig: Take 1 tablet (150 mg total) by mouth once a week for 4 doses.    Dispense:  4 tablet    Refill:  0   Semaglutide,0.25 or 0.5MG /DOS, 2 MG/3ML SOPN    Sig: Inject 0.25 mg into the skin once a week for 4 weeks, then increase to 0.5 mg weekly for at least 4 weeks.    Dispense:  3 mL    Refill:  3   Return in about 4 weeks (around 07/11/2022) for Ozempic f/u, DM2. Erskine Emery, MD 06/14/2022, 8:01 AM PGY-2, [place]

## 2022-06-13 NOTE — Assessment & Plan Note (Signed)
-   Consider collection of annual UACR  - Consider addition of amlodipine 5 mg  - Consider SGLT2 initiation for CKD treatment  - Consider pharmacy clinic referral for follow-up of elevated blood pressure / medication review

## 2022-06-13 NOTE — Patient Instructions (Addendum)
It was great to see you today! Thank you for choosing [facility] for your primary care. [name] was seen for itching under bra area  Today we addressed: Can continue with zinc oxide, petrolatum, or dimethicone topicals can be applied after cleaning the intertriginous area with mild soap and water and drying with a hair dryer on a cold setting Take one 150 mg tab of fluconazole once a week for 4 total weeks We will continue with Ozempic starting at a small dose of 0.25 mg injections once a week The need for an updated eye exam and foot exam; follow up in 1 month   If you haven't already, sign up for My Chart to have easy access to your labs results, and communication with your primary care physician.  I recommend that you always bring your medications to each appointment as this makes it easy to ensure you are on the correct medications and helps [place] not miss refills when you need them. Call the clinic at [phone] if your symptoms worsen or you have any concerns.  You should return to our clinic Return in about 4 weeks (around 07/11/2022) for Ozempic f/u, DM2. Please arrive 15 minutes before your appointment to ensure smooth check in process.  We appreciate your efforts in making this happen.  Thank you for allowing me to participate in your care, Erskine Emery, MD 06/13/2022, 11:38 AM PGY-2, [place]

## 2022-06-14 NOTE — Assessment & Plan Note (Signed)
stable - Last A1c:  Lab Results  Component Value Date   HGBA1C 6.9 04/04/2022   - Medications: Added Ozempic today for weight loss, failed metformin and Jardiance  - Statin: Crestor  - Checking BG at home - Recheck in 1 month  - Continue with diet and exercise

## 2022-06-21 ENCOUNTER — Other Ambulatory Visit (HOSPITAL_COMMUNITY): Payer: Self-pay

## 2022-06-22 ENCOUNTER — Other Ambulatory Visit (HOSPITAL_COMMUNITY): Payer: Self-pay

## 2022-06-25 ENCOUNTER — Other Ambulatory Visit (HOSPITAL_COMMUNITY): Payer: Self-pay

## 2022-06-25 ENCOUNTER — Telehealth: Payer: Self-pay | Admitting: Family Medicine

## 2022-06-25 NOTE — Telephone Encounter (Signed)
Called pt back. Instructed her to go to [email or web address] to sign up for savings card to provide to pharmacy. She will call back if she has any further difficulty with cost of prescription.

## 2022-06-25 NOTE — Telephone Encounter (Signed)
Pt was told by her pharmacy to call and request discount cards since her cost for the Fremanezumab-vfrm (AJOVY) 225 MG/1.5ML SOAJ, has gone from $5.00 to $150.00 please call pt.

## 2022-06-26 ENCOUNTER — Other Ambulatory Visit (HOSPITAL_COMMUNITY): Payer: Self-pay

## 2022-06-27 DIAGNOSIS — J45901 Unspecified asthma with (acute) exacerbation: Secondary | ICD-10-CM | POA: Diagnosis not present

## 2022-06-27 DIAGNOSIS — G4733 Obstructive sleep apnea (adult) (pediatric): Secondary | ICD-10-CM | POA: Diagnosis not present

## 2022-06-29 ENCOUNTER — Other Ambulatory Visit (HOSPITAL_COMMUNITY): Payer: Self-pay

## 2022-07-03 ENCOUNTER — Other Ambulatory Visit (HOSPITAL_COMMUNITY): Payer: Self-pay

## 2022-07-04 ENCOUNTER — Other Ambulatory Visit (HOSPITAL_COMMUNITY): Payer: Self-pay

## 2022-07-05 ENCOUNTER — Other Ambulatory Visit (HOSPITAL_COMMUNITY): Payer: Self-pay

## 2022-07-05 ENCOUNTER — Telehealth: Payer: Self-pay

## 2022-07-05 ENCOUNTER — Encounter: Payer: Self-pay | Admitting: Pulmonary Disease

## 2022-07-05 ENCOUNTER — Ambulatory Visit: Payer: 59 | Admitting: Pulmonary Disease

## 2022-07-05 VITALS — BP 132/74 | HR 78 | Temp 97.9°F | Ht 61.0 in | Wt 210.2 lb

## 2022-07-05 DIAGNOSIS — R0602 Shortness of breath: Secondary | ICD-10-CM

## 2022-07-05 DIAGNOSIS — J4550 Severe persistent asthma, uncomplicated: Secondary | ICD-10-CM | POA: Diagnosis not present

## 2022-07-05 DIAGNOSIS — G4733 Obstructive sleep apnea (adult) (pediatric): Secondary | ICD-10-CM

## 2022-07-05 DIAGNOSIS — Z9989 Dependence on other enabling machines and devices: Secondary | ICD-10-CM | POA: Diagnosis not present

## 2022-07-05 MED ORDER — BREZTRI AEROSPHERE 160-9-4.8 MCG/ACT IN AERO: 2.0000 | INHALATION_SPRAY | Freq: Two times a day (BID) | RESPIRATORY_TRACT | 0 refills | Status: DC

## 2022-07-05 NOTE — Patient Instructions (Signed)
We provided you with another breakout for the [place].  We provided you with some Breztri samples.  See you in follow-up in 4 months time call sooner should any new problems arise.

## 2022-07-05 NOTE — Progress Notes (Signed)
Subjective:    Patient ID: [name], female    DOB: 07/21/1966, 56 y.o.   MRN: [id number] Patient Care Team: Arlyce Dice, MD as PCP - General (Family Medicine) Tyler Pita, MD as Consulting Physician (Pulmonary Disease)  Chief Complaint  Patient presents with   Follow-up    Wearing cpap nightly- pressure and mask is okay. C/o prod cough with white sputum and occ SOB with exertion.    HPI Patient is a 56 year old former smoker with very minimal tobacco exposure in the past who presents for follow-up on the issue of moderate to severe persistent asthma with asthmatic bronchitis, cough and chronic rhinosinusitis.  This is a scheduled visit.  She was last evaluated on 17 January 2022. Since that visit she has noted that [place] continues to help her significantly with her breathing issues.  She continues to take [employer].  Today she tells me that she has had difficulties refilling her Dupixent due to not having co-pay coupons.  Unsure why this is as the pharmacy team has not alerted [place] of this issue.  She states that she continues to do well with the [place] this has really helped her with control of her asthmatic exacerbations.  She has not had any recent allergic rhinitis flares.  No wheezing.  No cough, sputum production or hemoptysis.  No orthopnea or paroxysmal nocturnal dyspnea.  No lower extremity edema.  Continues to have issues with weight management. She would benefit significantly from weight loss as this would also help her respiratory status.   Review of Systems A 10 point review of systems was performed and it is as noted above otherwise negative.  Patient Active Problem List   Diagnosis Date Noted   Pruritic intertrigo 04/18/2022   Hyperlipidemia due to type 2 diabetes mellitus ([place]) 11/27/2021   Chronic mixed headache syndrome 06/05/2021   Back pain of lumbosacral region with sciatica 06/08/2020   Scar of scalp 11/13/2019   Restless leg syndrome 09/05/2017    Chronic migraine without aura with status migrainosus, not intractable 07/26/2017   Type 2 diabetes mellitus with hyperglycemia ([place]) 10/03/2015   HTN (hypertension) 10/03/2015   Fatigue 07/21/2015   Obstructive sleep apnea 09/08/2010   Hypercholesterolemia 09/20/2008   HOT FLASHES 09/20/2008   Depression with anxiety 12/26/2006   Asthma 12/26/2006   Social History   Tobacco Use   Smoking status: Former    Packs/day: 0.25    Years: 15.00    Total pack years: 3.75    Types: Cigarettes    Quit date: 02/16/2003    Years since quitting: 19.3   Smokeless tobacco: Never  Substance Use Topics   Alcohol use: Yes    Alcohol/week: 3.0 standard drinks of alcohol    Types: 3 Glasses of wine per week   Allergies  Allergen Reactions   Amitriptyline     Abnormal behavior. Just doesn't feel like normal self    Fluticasone-Salmeterol Other (See Comments)    Thrush even with mouth rinsing; worsens cough   Imitrex [Sumatriptan]     Made her head feel like its on fire   Penicillins Itching and Swelling    Has patient had a PCN reaction causing immediate rash, facial/tongue/throat swelling, SOB or lightheadedness with hypotension: No Has patient had a PCN reaction causing severe rash involving mucus membranes or skin necrosis: No Has patient had a PCN reaction that required hospitalization: No Has patient had a PCN reaction occurring within the last 10 years: Yes If all of  the above answers are "NO", then may proceed with Cephalosporin use.    Ciprofloxacin Itching and Rash   Macrobid [Nitrofurantoin Monohyd Macro] Itching   Sulfamethoxazole-Trimethoprim Rash   Current Meds  Medication Sig   albuterol (VENTOLIN HFA) 108 (90 Base) MCG/ACT inhaler Inhale 2 puffs into the lungs every 6 (six) hours as needed for wheezing or shortness of breath.   azelastine (ASTELIN) 0.1 % nasal spray Place 1 spray into both nostrils 2 (two) times daily. Use in each nostril as directed   benzonatate (TESSALON)  100 MG capsule Take 1 capsule (100 mg total) by mouth 2 (two) times daily as needed for cough.   BIOTIN PO Take 2,000 mg by mouth daily.   blood glucose meter kit and supplies KIT Use as directed.   Budeson-Glycopyrrol-Formoterol (BREZTRI AEROSPHERE) 160-9-4.8 MCG/ACT AERO Inhale 2 puffs into the lungs in the morning and at bedtime.   Budeson-Glycopyrrol-Formoterol (BREZTRI AEROSPHERE) 160-9-4.8 MCG/ACT AERO Inhale 2 puffs into the lungs in the morning and at bedtime.   cetirizine (ZYRTEC) 10 MG tablet Take 1 tablet (10 mg total) by mouth daily.   Dupilumab (DUPIXENT) 300 MG/2ML SOPN Inject 300 mg into the skin every 14 (fourteen) days. Loading dose of 600mg  received on 11/07/21   Elastic Bandages & Supports (SHOULDER BRACE LARGE) MISC 1 each by Does not apply route as needed.   esomeprazole (NEXIUM) 40 MG capsule Take 1 capsule (40 mg total) by mouth daily.   estradiol (ESTRACE VAGINAL) 0.1 MG/GM vaginal cream Place 1 g vaginally daily for 2 weeks, then use twice weekly thereafter.   fluconazole (DIFLUCAN) 150 MG tablet Take 1 tablet (150 mg total) by mouth once a week for 4 doses.   fluorometholone (FML) 0.1 % ophthalmic suspension Place 1 drop into both eyes in the morning and at bedtime.   fluticasone (FLONASE) 50 MCG/ACT nasal spray Place 2 sprays into both nostrils daily.   Fremanezumab-vfrm (AJOVY) 225 MG/1.5ML SOAJ Inject 225 mg into the skin every 30 (thirty) days.   gabapentin (NEURONTIN) 300 MG capsule Take 1 capsule (300 mg total) by mouth every 8 (eight) hours as needed for pain   glucose blood test strip Use as directed 2 times daily.   Lancets (FREESTYLE) lancets Use as directed 2 times daily.   levalbuterol (XOPENEX) 1.25 MG/3ML nebulizer solution Take 1.25 mg by nebulization every 8 (eight) hours as needed for wheezing or shortness of breath.   losartan-hydrochlorothiazide (HYZAAR) 100-25 MG tablet Take 1 tablet by mouth daily.   Multiple Vitamin (MULTIVITAMIN WITH MINERALS) TABS  tablet Take 1 tablet by mouth daily.   naproxen (NAPROSYN) 500 MG tablet Take 1 tablet (500 mg total) by mouth 2 (two) times daily as needed for mild or moderate pain   nystatin (MYCOSTATIN/NYSTOP) powder Apply 1 application topically 3 (three) times daily.   oxybutynin (DITROPAN XL) 15 MG 24 hr tablet Take 1 tablet (15 mg total) by mouth at bedtime.   Polyethyl Glycol-Propyl Glycol (SYSTANE OP) Place 1 drop into both eyes in the morning and at bedtime.   Pyridoxine HCl (VITAMIN B-6 PO) Take 1 tablet by mouth daily. Unknown OTC strength   Rimegepant Sulfate (NURTEC) 75 MG TBDP Take 75 mg by mouth daily as needed (take for abortive therapy of migraine, no more than 1 tablet in 24 hours or 10 per month).   rosuvastatin (CRESTOR) 10 MG tablet Take 1 tablet (10 mg total) by mouth daily.   Semaglutide,0.25 or 0.5MG /DOS, 2 MG/3ML SOPN Inject 0.25 mg into  the skin once a week for 4 weeks, then increase to 0.5 mg weekly for at least 4 weeks.   sertraline (ZOLOFT) 50 MG tablet Take 1 tablet (50 mg total) by mouth daily.   Spacer/Aero-Holding Chambers (AEROCHAMBER MV) inhaler Use as instructed   vitamin C (ASCORBIC ACID) 500 MG tablet Take 500 mg by mouth daily.    [DISCONTINUED] cephALEXin (KEFLEX) 500 MG capsule Take 1 capsule (500 mg total) by mouth 2 (two) times daily for 7 days   [DISCONTINUED] cetirizine (ZYRTEC ALLERGY) 10 MG tablet Take 1 tablet (10 mg total) by mouth daily as needed for allergies or rhinitis.   Current Facility-Administered Medications for the 07/05/22 encounter (Office Visit) with Tyler Pita, MD  Medication   0.9 %  sodium chloride infusion   Immunization History  Administered Date(s) Administered   Influenza Split 07/22/2016   Influenza Whole 07/28/2008   Influenza,inj,Quad PF,6+ Mos 07/29/2013, 07/13/2019   Influenza-Unspecified 08/12/2014, 07/29/2015, 07/28/2017, 07/29/2020, 07/26/2021   Moderna Sars-Covid-2 Vaccination 10/21/2019, 11/09/2019   PFIZER  Comirnaty(Gray Top)Covid-19 Tri-Sucrose Vaccine 07/19/2020   Pneumococcal Polysaccharide-23 08/01/2016   Td 05/29/2002   Tdap 09/05/2016       Objective:   Physical Exam BP 132/74 (BP Location: Left Arm, Cuff Size: Large)   Pulse 78   Temp 97.9 F (36.6 C) (Temporal)   Ht $R'5\' 1"'Ff$  (1.549 m)   Wt 210 lb 3.2 oz (95.3 kg)   LMP 05/21/2017 (Exact Date)   SpO2 99%   BMI 39.72 kg/m  GENERAL: Obese woman, no acute distress, fully ambulatory.  No conversational dyspnea. HEAD: Normocephalic, atraumatic.  EYES: Pupils equal, round, reactive to light.  No scleral icterus.  MOUTH: Nose/mouth/throat not examined due to masking requirements for COVID 19. NECK: Thick neck, supple. No thyromegaly. Trachea midline. No JVD.  No adenopathy. PULMONARY: Good air entry bilaterally.  No adventitious sounds. CARDIOVASCULAR: S1 and S2. Regular rate and rhythm.  No rubs, murmurs or gallops heard. ABDOMEN: Obese. MUSCULOSKELETAL: No joint deformity, no clubbing, no edema.  NEUROLOGIC: No focal deficit, no gait disturbance, speech is fluent. SKIN: Intact,warm,dry. PSYCH: Mood and behavior normal     Assessment & Plan:     ICD-10-CM   1. Severe persistent asthma without complication - asthmatic bronchitis  J45.50    Continue Dupixent She was provided with coupons for the Dupixent Continue Breztri 2 puffs twice a day Continue as needed levo albuterol    2. Shortness of breath  R06.02    Overall doing well in general Better managed since started Dupixent    3. OSA on CPAP  G47.33    Z99.89    Compliant with CPAP Continue same     Meds ordered this encounter  Medications   Budeson-Glycopyrrol-Formoterol (BREZTRI AEROSPHERE) 160-9-4.8 MCG/ACT AERO    Sig: Inhale 2 puffs into the lungs in the morning and at bedtime.    Dispense:  5.9 g    Refill:  0    Order Specific Question:   Lot Number?    Answer:   [id number]    Order Specific Question:   Expiration Date?    Answer:   10/29/2024    Order  Specific Question:   Manufacturer?    Answer:   [employer] [[id number]]    Order Specific Question:   Quantity    Answer:   2   We have given her appropriate coupons for Dupixent.  Provided her with some Breztri samples today.  She is to follow-up in 4 months time she  is to contact us prior to that time should any new difficulties arise.  Renold Don, MD Advanced Bronchoscopy PCCM [facility]    *This note was dictated using voice recognition software/Dragon.  Despite best efforts to proofread, errors can occur which can change the meaning. Any transcriptional errors that result from this process are unintentional and may not be fully corrected at the time of dictation.

## 2022-07-05 NOTE — Telephone Encounter (Signed)
Lm for [name] with adapt to request download.

## 2022-07-06 NOTE — Telephone Encounter (Signed)
Spoke to [name]. He stated that patient has S9 machine and will need to bring SD card in for download.   Lm for patient to make her aware.

## 2022-07-09 ENCOUNTER — Telehealth: Payer: Self-pay | Admitting: Pulmonary Disease

## 2022-07-09 NOTE — Telephone Encounter (Signed)
Lm x2 for patient.  Will close encounter per office protocol.   

## 2022-07-09 NOTE — Telephone Encounter (Addendum)
Please refer to 07/06/2022 phone note. Patient is aware of recommendations and voiced her understanding.  Nothing further needed.

## 2022-07-12 ENCOUNTER — Other Ambulatory Visit (HOSPITAL_COMMUNITY): Payer: Self-pay

## 2022-07-20 ENCOUNTER — Other Ambulatory Visit (HOSPITAL_COMMUNITY): Payer: Self-pay

## 2022-07-24 ENCOUNTER — Other Ambulatory Visit (HOSPITAL_COMMUNITY): Payer: Self-pay

## 2022-07-26 ENCOUNTER — Other Ambulatory Visit (HOSPITAL_COMMUNITY): Payer: Self-pay

## 2022-07-30 ENCOUNTER — Other Ambulatory Visit (HOSPITAL_COMMUNITY): Payer: Self-pay

## 2022-08-02 ENCOUNTER — Encounter: Payer: Self-pay | Admitting: Pulmonary Disease

## 2022-08-06 ENCOUNTER — Other Ambulatory Visit (HOSPITAL_COMMUNITY): Payer: Self-pay

## 2022-08-07 ENCOUNTER — Other Ambulatory Visit (HOSPITAL_COMMUNITY): Payer: Self-pay

## 2022-08-07 ENCOUNTER — Encounter: Payer: Self-pay | Admitting: Family Medicine

## 2022-08-07 ENCOUNTER — Ambulatory Visit: Payer: 59 | Admitting: Family Medicine

## 2022-08-07 VITALS — BP 153/92 | HR 70 | Ht 61.0 in | Wt 209.2 lb

## 2022-08-07 DIAGNOSIS — R102 Pelvic and perineal pain: Secondary | ICD-10-CM

## 2022-08-07 DIAGNOSIS — L304 Erythema intertrigo: Secondary | ICD-10-CM | POA: Diagnosis not present

## 2022-08-07 DIAGNOSIS — F418 Other specified anxiety disorders: Secondary | ICD-10-CM

## 2022-08-07 DIAGNOSIS — R058 Other specified cough: Secondary | ICD-10-CM | POA: Diagnosis not present

## 2022-08-07 DIAGNOSIS — E1165 Type 2 diabetes mellitus with hyperglycemia: Secondary | ICD-10-CM

## 2022-08-07 DIAGNOSIS — I10 Essential (primary) hypertension: Secondary | ICD-10-CM | POA: Diagnosis not present

## 2022-08-07 LAB — POCT GLYCOSYLATED HEMOGLOBIN (HGB A1C): HbA1c, POC (controlled diabetic range): 7 % (ref 0.0–7.0)

## 2022-08-07 MED ORDER — BENZONATATE 100 MG PO CAPS
100.0000 mg | ORAL_CAPSULE | Freq: Two times a day (BID) | ORAL | 0 refills | Status: DC | PRN
  Filled 2022-08-07: qty 20, 10d supply, fill #0

## 2022-08-07 MED ORDER — LOSARTAN POTASSIUM-HCTZ 100-25 MG PO TABS
1.0000 | ORAL_TABLET | Freq: Every day | ORAL | 1 refills | Status: DC
  Filled 2022-08-07 – 2022-09-26 (×2): qty 90, 90d supply, fill #0
  Filled 2022-12-05 – 2022-12-14 (×2): qty 30, 30d supply, fill #1
  Filled 2023-02-05: qty 30, 30d supply, fill #2
  Filled 2023-03-06: qty 30, 30d supply, fill #3

## 2022-08-07 MED ORDER — NAPROXEN 500 MG PO TABS
500.0000 mg | ORAL_TABLET | Freq: Two times a day (BID) | ORAL | 0 refills | Status: DC | PRN
  Filled 2022-08-07: qty 60, 30d supply, fill #0

## 2022-08-07 MED ORDER — NYSTATIN 100000 UNIT/GM EX POWD
1.0000 | Freq: Three times a day (TID) | CUTANEOUS | 0 refills | Status: DC
  Filled 2022-08-07: qty 60, 20d supply, fill #0

## 2022-08-07 MED ORDER — GABAPENTIN 300 MG PO CAPS
300.0000 mg | ORAL_CAPSULE | Freq: Three times a day (TID) | ORAL | 2 refills | Status: DC
  Filled 2022-08-07: qty 90, 30d supply, fill #0
  Filled 2022-09-26: qty 90, 30d supply, fill #1
  Filled 2022-12-05 – 2022-12-14 (×2): qty 90, 30d supply, fill #2

## 2022-08-07 MED ORDER — SERTRALINE HCL 50 MG PO TABS
50.0000 mg | ORAL_TABLET | Freq: Every day | ORAL | 1 refills | Status: DC
  Filled 2022-08-07 – 2022-09-26 (×2): qty 90, 90d supply, fill #0

## 2022-08-07 MED ORDER — ESOMEPRAZOLE MAGNESIUM 40 MG PO CPDR
40.0000 mg | DELAYED_RELEASE_CAPSULE | Freq: Every day | ORAL | 1 refills | Status: DC
  Filled 2022-08-07 – 2022-09-26 (×2): qty 90, 90d supply, fill #0
  Filled 2022-12-05 – 2022-12-14 (×2): qty 30, 30d supply, fill #1
  Filled 2023-02-05: qty 30, 30d supply, fill #2
  Filled 2023-03-06: qty 30, 30d supply, fill #3

## 2022-08-07 MED ORDER — SEMAGLUTIDE(0.25 OR 0.5MG/DOS) 2 MG/3ML ~~LOC~~ SOPN
0.2500 mg | PEN_INJECTOR | SUBCUTANEOUS | 3 refills | Status: DC
  Filled 2022-08-07: qty 3, 28d supply, fill #0
  Filled 2023-02-05: qty 3, 28d supply, fill #1

## 2022-08-07 MED ORDER — ROSUVASTATIN CALCIUM 10 MG PO TABS
10.0000 mg | ORAL_TABLET | Freq: Every day | ORAL | 1 refills | Status: DC
  Filled 2022-08-07: qty 90, 90d supply, fill #0
  Filled 2022-12-05 – 2022-12-14 (×2): qty 30, 30d supply, fill #1
  Filled 2023-02-05: qty 30, 30d supply, fill #2
  Filled 2023-03-06: qty 30, 30d supply, fill #3

## 2022-08-07 MED ORDER — OXYBUTYNIN CHLORIDE ER 15 MG PO TB24
15.0000 mg | ORAL_TABLET | Freq: Every day | ORAL | 1 refills | Status: DC
  Filled 2022-08-07 – 2022-09-26 (×2): qty 90, 90d supply, fill #0

## 2022-08-07 MED ORDER — FLUTICASONE PROPIONATE 50 MCG/ACT NA SUSP
2.0000 | Freq: Every day | NASAL | 6 refills | Status: DC
  Filled 2022-08-07: qty 16, 30d supply, fill #0
  Filled 2022-09-26: qty 16, 30d supply, fill #1
  Filled 2023-02-05: qty 16, 30d supply, fill #2
  Filled 2023-03-06: qty 16, 30d supply, fill #3
  Filled 2023-04-11: qty 16, 30d supply, fill #4
  Filled 2023-05-15: qty 16, 30d supply, fill #5
  Filled 2023-07-12: qty 16, 30d supply, fill #6

## 2022-08-07 NOTE — Progress Notes (Signed)
    SUBJECTIVE:   CHIEF COMPLAINT / HPI: cough, breast rash  IG is a 56yo F w/ hx of recurrent candida intertrigo that p/w ongoing rash and recent viral infxn.   Candida intertrigo Pt has hx of recurrent itchy underbreast rash that started this summer. She was prescribed a 4wk course of weekly Diflucan at prior visit, and reports that her rash improved while she was taking it. She finished the course 2 wks ago, and noticed that the rash returned a few days later. She has been applying antifungal powder with some relief, but she still gets very itchy by the end of the day. She showers daily. She recently has tried OTC topical cortisone, and reports some relief from it.  Viral URI Pt has 3 wk hx of congestion, pressure HA, fatigue and productive cough. She had a Tmax of 100F at home, resolved with tylenol and ibuprofen. She has been using her albuterol (twice daily) during this infection. She feels that she is overall improving, but the cough is lingering and bothering her the most. She has tried Vicks Vapocool sinus pills (OTC), vicks cough drops, mucinex and these have been helpful for her cough. Cough was initially productive for green sputum, but now has become clear.  Take BP meds everyday, needs refill  PERTINENT  PMH / PSH: T2DM, HTN, Asthma, OSA  OBJECTIVE:   BP (!) 153/92   Pulse 70   Ht '5\' 1"'$  (1.549 m)   Wt 209 lb 3.2 oz (94.9 kg)   LMP 05/21/2017 (Exact Date)   SpO2 100%   BMI 39.53 kg/m   Gen: Alert, friendly, well appearing woman. NAD. Resp: CTAB, no wheezing or crackles. Normal WOB on RA. CV: RRR Skin: Mildly erythematous along inferior breast line with white powder on top.  ASSESSMENT/PLAN:   Post-viral cough syndrome Has 3 wk hx of cough, and also congestion, pressure HA's, fatigue. Symptoms have since resolved, but cough is lingering. Satting well and normal WOB on RA. Most likely post-viral cough given time course of symptoms and low suspicion for active infection  at this time.  - Cont conservative management - Discussed that post-vireal cough can have extended time course, especially given her underlying COPD-associated cough at baseline.   Type 2 diabetes mellitus with hyperglycemia (HCC) A1c 7 today. Pt has only received 1 shot of ozempic, she did not realize that it was a weekly injection. 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 It was previously treated with weekly diflucan x4wks, with initial improvement but then rash returned after finishing the course. She has recently tried topical cortisone with relief.  - Diflucan weekly x4wks - Discussed that recurrent infection is not uncommon. Symptoms may improve as weather becomes cooler.  - Cont nystatin powder - Optimize tx of T2DM  HTN (hypertension) BP elevated today. Pt reports taking meds as prescribed.  - Cont current regimen. No changes today. - F/u in 5 wks to recheck BP and adjust meds as needed.   F/u in 5 wks for cough and HTN.  Arlyce Dice, MD [facility]

## 2022-08-07 NOTE — Patient Instructions (Addendum)
Good to see you today - Thank you for coming in  Things we discussed today:  1) For your breast rash - I will prescribe another course of Diflucan. Take this once a week, for 4 weeks.  - Hopefully as the weather gets cooler, it will be easier to control the rash.  - Continue applying the antifungal powder and keeping your under-breast dry as much as possible. Consider taking time in the middle of the day to re-apply if needed.  - STOP applying cortisone cream. It may provide some immediate relief, but it can make the infection worse in the long-run.  2) For your diabetes - Continue taking your Ozempic injection once a week  3) You can expect your cough to continue for 8 weeks after a viral infection, although it may take longer since you also have cough at baseline. Come back to see me in 5 weeks if the cough is still there.  Please always bring your medication bottles  Come back to see me in 5 weeks to check on your blood pressure and cough.

## 2022-08-08 ENCOUNTER — Telehealth: Payer: Self-pay | Admitting: Neurology

## 2022-08-08 LAB — MICROALBUMIN / CREATININE URINE RATIO
Creatinine, Urine: 170.3 mg/dL
Microalb/Creat Ratio: 6 mg/g creat (ref 0–29)
Microalbumin, Urine: 10.5 ug/mL

## 2022-08-08 NOTE — Telephone Encounter (Signed)
PA completed on CMM/medimpact [id number] Approved immediately  authorization is effective for a maximum of 12 fill(s) from 08/08/2022 to 08/08/2023

## 2022-08-10 DIAGNOSIS — R058 Other specified cough: Secondary | ICD-10-CM | POA: Insufficient documentation

## 2022-08-10 NOTE — Assessment & Plan Note (Signed)
Has 3 wk hx of cough, and also congestion, pressure HA's, fatigue. Symptoms have since resolved, but cough is lingering. Satting well and normal WOB on RA. Most likely post-viral cough given time course of symptoms and low suspicion for active infection at this time.  - Cont conservative management - Discussed that post-vireal cough can have extended time course, especially given her underlying COPD-associated cough at baseline.

## 2022-08-10 NOTE — Assessment & Plan Note (Signed)
BP elevated today. Pt reports taking meds as prescribed.  - Cont current regimen. No changes today. - F/u in 5 wks to recheck BP and adjust meds as needed.

## 2022-08-10 NOTE — Assessment & Plan Note (Addendum)
A1c 7 today. Pt has only received 1 shot of ozempic, she did not realize that it was a weekly injection. - Cont Ozempic  - microalb/Cr wnl today

## 2022-08-10 NOTE — Assessment & Plan Note (Signed)
Has hx of recurrent intertrigo that started this year. It was previously treated with weekly diflucan x4wks, with initial improvement but then rash returned after finishing the course. She has recently tried topical cortisone with relief.  - Diflucan weekly x4wks - Discussed that recurrent infection is not uncommon. Symptoms may improve as weather becomes cooler.  - Cont nystatin powder - Optimize tx of T2DM

## 2022-08-16 ENCOUNTER — Other Ambulatory Visit (HOSPITAL_COMMUNITY): Payer: Self-pay

## 2022-08-16 ENCOUNTER — Telehealth: Payer: Self-pay | Admitting: Pulmonary Disease

## 2022-08-16 ENCOUNTER — Telehealth: Payer: Self-pay

## 2022-08-16 MED ORDER — METHYLPREDNISOLONE 4 MG PO TBPK
ORAL_TABLET | ORAL | 0 refills | Status: DC
  Filled 2022-08-16: qty 21, 6d supply, fill #0

## 2022-08-16 MED ORDER — AZITHROMYCIN 250 MG PO TABS
ORAL_TABLET | ORAL | 0 refills | Status: AC
  Filled 2022-08-16: qty 6, 5d supply, fill #0

## 2022-08-16 NOTE — Telephone Encounter (Signed)
Patient calls nurse line regarding continued irritation under breasts. Patient reports that the nystatin has not improved symptoms and that she developed a "red spot" from using powder.   She states that the Diflucan pills helped with this concern. She is requesting a refill be sent to [facility].   Provided with return precautions.   Talbot Grumbling, RN

## 2022-08-16 NOTE — Telephone Encounter (Signed)
Send a Medrol dose pack and azithromycin Z-Pak please

## 2022-08-16 NOTE — Telephone Encounter (Signed)
Called and spoke with patient, advised her of recommendations per Dr. Patsey Berthold.  Verified pharmacy and scripts sent.  Nothing further needed.

## 2022-08-16 NOTE — Telephone Encounter (Signed)
Primary Pulmonologist: Patsey Berthold Last office visit and with whom: 07/05/2022 Patsey Berthold What do we see them for (pulmonary problems): Asthma, OSA, sob Last OV assessment/plan:   Assessment & Plan:        ICD-10-CM    1. Severe persistent asthma without complication - asthmatic bronchitis  J45.50      Continue Dupixent She was provided with coupons for the Dupixent Continue Breztri 2 puffs twice a day Continue as needed levo albuterol     2. Shortness of breath  R06.02      Overall doing well in general Better managed since started Dupixent     3. OSA on CPAP  G47.33      Z99.89      Compliant with CPAP Continue same            Meds ordered this encounter  Medications   Budeson-Glycopyrrol-Formoterol (BREZTRI AEROSPHERE) 160-9-4.8 MCG/ACT AERO      Sig: Inhale 2 puffs into the lungs in the morning and at bedtime.      Dispense:  5.9 g      Refill:  0      Order Specific Question:   Lot Number?      Answer:   [id number]      Order Specific Question:   Expiration Date?      Answer:   10/29/2024      Order Specific Question:   Manufacturer?      Answer:   [employer]stra[employer] [[id number]]      Order Specific Question:   Quantity      Answer:   2    We have given her appropriate coupons for Dupixent.  Provided her with some Breztri samples today.  She is to follow-up in 4 months time she is to contact us prior to that time should any new difficulties arise.   Renold Don, MD Advanced Bronchoscopy PCCM [facility]       *This note was dictated using voice recognition software/Dragon.  Despite best efforts to proofread, errors can occur which can change the meaning. Any transcriptional errors that result from this process are unintentional and may not be fully corrected at the time of dictation.          Patient Instructions by Tyler Pita, MD at 07/05/2022 2:00 PM  Author: Tyler Pita, MD Author Type: Physician Filed: 07/05/2022  2:22 PM  Note Status: Signed  Cosign: Cosign Not Required Encounter Date: 07/05/2022  Editor: Tyler Pita, MD (Physician)               We provided you with another breakout for the [place].   We provided you with some Breztri samples.   See you in follow-up in 4 months time call sooner should any new problems arise.       Orthostatic Vitals Recorded in This Encounter   07/05/2022  1405     BP Location: Left Arm  Cuff Size: Large   Instructions  We provided you with another breakout for the [place].   We provided you with some Breztri samples.      Was appointment offered to patient (explain)?  no   Reason for call: Cough, back of throat is dry when she gets up. Congested in her head, at night.  Voice has been raspy.  Sleeping with head propped up.  Uses saline nasal spray and saline nasal gel.  She is using pseudephed and mucinex DM.  She is using her Astelin nasal spray.  She  is using her albuterol inhaler and nebulizer as needed.  She also has Flonase.  She is using saline nasal rinses.  Her mucous has gone from green to yellow.  She has had symptoms for 3 weeks.  Negative negative for covid.    Dr. Patsey Berthold, please advise if you want medrol dose pack sent in to patient's pharmacy.  Thank you.  (examples of things to ask: : When did symptoms start? Fever? Cough? Productive? Color to sputum? More sputum than usual? Wheezing? Have you needed increased oxygen? Are you taking your respiratory medications? What over the counter measures have you tried?)  Allergies  Allergen Reactions   Amitriptyline     Abnormal behavior. Just doesn't feel like normal self    Fluticasone-Salmeterol Other (See Comments)    Thrush even with mouth rinsing; worsens cough   Imitrex [Sumatriptan]     Made her head feel like its on fire   Penicillins Itching and Swelling    Has patient had a PCN reaction causing immediate rash, facial/tongue/throat swelling, SOB or lightheadedness with hypotension: No Has patient had a  PCN reaction causing severe rash involving mucus membranes or skin necrosis: No Has patient had a PCN reaction that required hospitalization: No Has patient had a PCN reaction occurring within the last 10 years: Yes If all of the above answers are "NO", then may proceed with Cephalosporin use.    Ciprofloxacin Itching and Rash   Macrobid [Nitrofurantoin Monohyd Macro] Itching   Sulfamethoxazole-Trimethoprim Rash    Immunization History  Administered Date(s) Administered   Influenza Split 07/22/2016   Influenza Whole 07/28/2008   Influenza,inj,Quad PF,6+ Mos 07/29/2013, 07/13/2019   Influenza-Unspecified 08/12/2014, 07/29/2015, 07/28/2017, 07/29/2020, 07/26/2021   Moderna Sars-Covid-2 Vaccination 10/21/2019, 11/09/2019   PFIZER Comirnaty(Gray Top)Covid-19 Tri-Sucrose Vaccine 07/19/2020   Pneumococcal Polysaccharide-23 08/01/2016   Td 05/29/2002   Tdap 09/05/2016

## 2022-08-17 ENCOUNTER — Other Ambulatory Visit: Payer: Self-pay | Admitting: Pulmonary Disease

## 2022-08-17 ENCOUNTER — Other Ambulatory Visit (HOSPITAL_COMMUNITY): Payer: Self-pay

## 2022-08-17 ENCOUNTER — Other Ambulatory Visit: Payer: Self-pay | Admitting: Family Medicine

## 2022-08-17 MED ORDER — FLUCONAZOLE 150 MG PO TABS
150.0000 mg | ORAL_TABLET | ORAL | 0 refills | Status: DC
  Filled 2022-08-17: qty 4, 28d supply, fill #0

## 2022-08-17 MED ORDER — ALBUTEROL SULFATE HFA 108 (90 BASE) MCG/ACT IN AERS
2.0000 | INHALATION_SPRAY | Freq: Four times a day (QID) | RESPIRATORY_TRACT | 6 refills | Status: DC | PRN
  Filled 2022-08-17: qty 6.7, 25d supply, fill #0
  Filled 2022-09-26: qty 6.7, 25d supply, fill #1
  Filled 2022-12-05 – 2022-12-14 (×2): qty 6.7, 25d supply, fill #2
  Filled 2023-02-05 (×2): qty 6.7, 25d supply, fill #3
  Filled 2023-03-19: qty 6.7, 25d supply, fill #4
  Filled 2023-04-11: qty 6.7, 25d supply, fill #5
  Filled 2023-05-15: qty 6.7, 25d supply, fill #6

## 2022-08-17 NOTE — Telephone Encounter (Signed)
Patient returns call to nurse line regarding previous message. She states that nystatin powder worsened area and needs this to be addressed as soon as possible. She is asking that refill of diflucan pills be sent to her pharmacy.   Spoke with Dr. Erin Hearing.   He sent in prescription for diflucan.   Patient called and made aware.   Talbot Grumbling, RN

## 2022-08-17 NOTE — Telephone Encounter (Signed)
Patient is requesting refill on Ventolin. Per last AVS, patient was instructed to continue xopenex. It appears that patient has not been prescribed xopenex HFA  previously only xopenex solution.   Dr. Patsey Berthold, please advise if okay to refill Ventolin. Thanks

## 2022-08-17 NOTE — Telephone Encounter (Signed)
Pls let her know I sent in Rx  For diflucan  She should try lamisil (terbinfine cream OTC) under her breasts as well  Thanks  LC

## 2022-08-21 NOTE — Telephone Encounter (Signed)
Pt informed. Kel Senn, CMA  

## 2022-08-28 ENCOUNTER — Other Ambulatory Visit (HOSPITAL_COMMUNITY): Payer: Self-pay

## 2022-08-30 ENCOUNTER — Other Ambulatory Visit (HOSPITAL_COMMUNITY): Payer: Self-pay

## 2022-09-04 ENCOUNTER — Other Ambulatory Visit (HOSPITAL_COMMUNITY): Payer: Self-pay

## 2022-09-07 ENCOUNTER — Other Ambulatory Visit (HOSPITAL_COMMUNITY): Payer: Self-pay

## 2022-09-10 ENCOUNTER — Other Ambulatory Visit (HOSPITAL_COMMUNITY): Payer: Self-pay

## 2022-09-19 DIAGNOSIS — J45901 Unspecified asthma with (acute) exacerbation: Secondary | ICD-10-CM | POA: Diagnosis not present

## 2022-09-19 DIAGNOSIS — G4733 Obstructive sleep apnea (adult) (pediatric): Secondary | ICD-10-CM | POA: Diagnosis not present

## 2022-09-26 ENCOUNTER — Other Ambulatory Visit: Payer: Self-pay | Admitting: Pulmonary Disease

## 2022-09-26 ENCOUNTER — Other Ambulatory Visit: Payer: Self-pay | Admitting: Family Medicine

## 2022-09-26 ENCOUNTER — Other Ambulatory Visit (HOSPITAL_COMMUNITY): Payer: Self-pay

## 2022-09-26 MED ORDER — BREZTRI AEROSPHERE 160-9-4.8 MCG/ACT IN AERO
2.0000 | INHALATION_SPRAY | Freq: Two times a day (BID) | RESPIRATORY_TRACT | 5 refills | Status: DC
  Filled 2022-09-26: qty 10.7, 30d supply, fill #0
  Filled 2022-12-05 – 2022-12-14 (×2): qty 10.7, 30d supply, fill #1

## 2022-09-27 ENCOUNTER — Other Ambulatory Visit (HOSPITAL_COMMUNITY): Payer: Self-pay

## 2022-09-28 ENCOUNTER — Other Ambulatory Visit (HOSPITAL_COMMUNITY): Payer: Self-pay

## 2022-09-28 MED ORDER — NAPROXEN 500 MG PO TABS
500.0000 mg | ORAL_TABLET | Freq: Two times a day (BID) | ORAL | 0 refills | Status: DC | PRN
  Filled 2022-09-28: qty 60, 30d supply, fill #0

## 2022-10-01 ENCOUNTER — Other Ambulatory Visit (HOSPITAL_COMMUNITY): Payer: Self-pay

## 2022-10-29 ENCOUNTER — Other Ambulatory Visit: Payer: Self-pay

## 2022-10-29 ENCOUNTER — Emergency Department (HOSPITAL_BASED_OUTPATIENT_CLINIC_OR_DEPARTMENT_OTHER)
Admission: EM | Admit: 2022-10-29 | Discharge: 2022-10-29 | Disposition: A | Discharge status: Home / Self Care | Payer: 59 | Attending: Emergency Medicine | Admitting: Emergency Medicine

## 2022-10-29 ENCOUNTER — Encounter (HOSPITAL_BASED_OUTPATIENT_CLINIC_OR_DEPARTMENT_OTHER): Payer: Self-pay | Admitting: Pediatrics

## 2022-10-29 DIAGNOSIS — R059 Cough, unspecified: Secondary | ICD-10-CM | POA: Diagnosis not present

## 2022-10-29 DIAGNOSIS — Z1152 Encounter for screening for COVID-19: Secondary | ICD-10-CM | POA: Insufficient documentation

## 2022-10-29 DIAGNOSIS — J441 Chronic obstructive pulmonary disease with (acute) exacerbation: Secondary | ICD-10-CM | POA: Diagnosis not present

## 2022-10-29 LAB — RESP PANEL BY RT-PCR (RSV, FLU A&B, COVID)  RVPGX2
Influenza A by PCR: NEGATIVE
Influenza B by PCR: NEGATIVE
Resp Syncytial Virus by PCR: NEGATIVE
SARS Coronavirus 2 by RT PCR: NEGATIVE

## 2022-10-29 MED ORDER — HYDROCODONE BIT-HOMATROP MBR 5-1.5 MG/5ML PO SOLN: 5.0000 mL | Freq: Four times a day (QID) | ORAL | 0 refills | Status: DC | PRN

## 2022-10-29 MED ORDER — PREDNISONE 20 MG PO TABS: 40.0000 mg | ORAL_TABLET | Freq: Every day | ORAL | 0 refills | Status: AC

## 2022-10-29 MED ORDER — AMOXICILLIN-POT CLAVULANATE 875-125 MG PO TABS: 1.0000 | ORAL_TABLET | Freq: Two times a day (BID) | ORAL | 0 refills | Status: DC

## 2022-10-29 NOTE — ED Provider Notes (Signed)
[facility] EMERGENCY DEPARTMENT Provider Note   CSN: [id number] Arrival date & time: 10/29/22  1630     History  Chief Complaint  Patient presents with   Cough   Headache    [name] is a 57 y.o. female.  57 female with past medical history [name] significant for COPD, asthma presents today for a 4 to 5-day duration of sinus congestion, postnasal drip, productive cough, wheezing.  She denies chest pain, shortness of breath.  Endorses increased use of albuterol inhaler as well as DuoNebs.  Reports compliance with all of her other home medications as well as her CPAP at night.  States her coughing spells are disrupting her sleep.  She denies fever.  She is hydrating well.  Reports ear pain on the right since this morning.  The history is provided by the patient. No language interpreter was used.       Home Medications Prior to Admission medications   Medication Sig Start Date End Date Taking? Authorizing Provider  albuterol (VENTOLIN HFA) 108 (90 Base) MCG/ACT inhaler Inhale 2 puffs into the lungs every 6 (six) hours as needed for wheezing or shortness of breath. 08/17/22   Tyler Pita, MD  azelastine (ASTELIN) 0.1 % nasal spray Place 1 spray into both nostrils 2 (two) times daily. Use in each nostril as directed 01/15/22   Simmons-Robinson, Riki Sheer, MD  benzonatate (TESSALON) 100 MG capsule Take 1 capsule (100 mg total) by mouth 2 (two) times daily as needed for cough. 08/07/22   Arlyce Dice, MD  BIOTIN PO Take 2,000 mg by mouth daily.    [provider]  blood glucose meter kit and supplies KIT Use as directed. 11/10/21   Martyn Malay, MD  Budeson-Glycopyrrol-Formoterol (BREZTRI AEROSPHERE) 160-9-4.8 MCG/ACT AERO Inhale 2 puffs into the lungs in the morning and at bedtime. 07/05/22   Tyler Pita, MD  Budeson-Glycopyrrol-Formoterol (BREZTRI AEROSPHERE) 160-9-4.8 MCG/ACT AERO Inhale 2 puffs into the lungs in the morning and at bedtime. 09/26/22    Tyler Pita, MD  cetirizine (ZYRTEC) 10 MG tablet Take 1 tablet (10 mg total) by mouth daily. 02/07/21   Simmons-Robinson, Makiera, MD  Dupilumab (DUPIXENT) 300 MG/2ML SOPN Inject 300 mg into the skin every 14 (fourteen) days. Loading dose of 645m received on 11/07/21 05/30/22   JTresa Garter MD  Elastic Bandages & Supports (SHOULDER BRACE LARGE) MISC 1 each by Does not apply route as needed. 03/24/20   FGuadalupe Dawn MD  esomeprazole (NEXIUM) 40 MG capsule Take 1 capsule (40 mg total) by mouth daily. 08/07/22   ZArlyce Dice MD  estradiol (ESTRACE VAGINAL) 0.1 MG/GM vaginal cream Place 1 g vaginally daily for 2 weeks, then use twice weekly thereafter. 02/26/22   DRadene Gunning MD  fluconazole (DIFLUCAN) 150 MG tablet Take 1 tablet (150 mg total) by mouth once a week for 4 doses. 08/17/22   CLind Covert MD  fluorometholone (FML) 0.1 % ophthalmic suspension Place 1 drop into both eyes in the morning and at bedtime. 12/03/20   [provider]  fluticasone (FLONASE) 50 MCG/ACT nasal spray Place 2 sprays into both nostrils daily. 08/07/22   ZArlyce Dice MD  Fremanezumab-vfrm (AJOVY) 225 MG/1.5ML SOAJ Inject 225 mg into the skin every 30 (thirty) days. 03/01/22   Lomax, Amy, NP  gabapentin (NEURONTIN) 300 MG capsule Take 1 capsule (300 mg total) by mouth every 8 (eight) hours as needed for pain 08/07/22   ZArlyce Dice MD  glucose blood  test strip Use as directed 2 times daily. 11/10/21   Martyn Malay, MD  Lancets (FREESTYLE) lancets Use as directed 2 times daily. 11/10/21   Martyn Malay, MD  levalbuterol Penne Lash) 1.25 MG/3ML nebulizer solution Take 1.25 mg by nebulization every 8 (eight) hours as needed for wheezing or shortness of breath. 01/17/22   Tyler Pita, MD  losartan-hydrochlorothiazide (HYZAAR) 100-25 MG tablet Take 1 tablet by mouth daily. 08/07/22   Arlyce Dice, MD  methylPREDNISolone (MEDROL DOSEPAK) 4 MG TBPK tablet Take as directed on package. 08/16/22    Tyler Pita, MD  Multiple Vitamin (MULTIVITAMIN WITH MINERALS) TABS tablet Take 1 tablet by mouth daily.    [provider]  naproxen (NAPROSYN) 500 MG tablet Take 1 tablet (500 mg total) by mouth 2 (two) times daily as needed for mild or moderate pain 09/28/22   Arlyce Dice, MD  nystatin (MYCOSTATIN/NYSTOP) powder Apply 1 application topically 3 (three) times daily. 08/07/22   Arlyce Dice, MD  oxybutynin (DITROPAN XL) 15 MG 24 hr tablet Take 1 tablet (15 mg total) by mouth at bedtime. 08/07/22   Arlyce Dice, MD  Polyethyl Glycol-Propyl Glycol (SYSTANE OP) Place 1 drop into both eyes in the morning and at bedtime.    [provider]  Pyridoxine HCl (VITAMIN B-6 PO) Take 1 tablet by mouth daily. Unknown OTC strength    [provider]  Rimegepant Sulfate (NURTEC) 75 MG TBDP Take 75 mg by mouth daily as needed (take for abortive therapy of migraine, no more than 1 tablet in 24 hours or 10 per month). 03/01/22   Lomax, Amy, NP  rosuvastatin (CRESTOR) 10 MG tablet Take 1 tablet (10 mg total) by mouth daily. 08/07/22   Arlyce Dice, MD  Semaglutide,0.25 or 0.5MG/DOS, 2 MG/3ML SOPN Inject 0.25 mg into the skin once a week for 4 weeks, then increase to 0.5 mg weekly for at least 4 weeks. 08/07/22   Arlyce Dice, MD  sertraline (ZOLOFT) 50 MG tablet Take 1 tablet (50 mg total) by mouth daily. 08/07/22   Arlyce Dice, MD  Spacer/Aero-Holding Chambers (AEROCHAMBER MV) inhaler Use as instructed 05/09/15   Vilinda Boehringer, MD  vitamin C (ASCORBIC ACID) 500 MG tablet Take 500 mg by mouth daily.     [provider]      Allergies    Amitriptyline, Fluticasone-salmeterol, Imitrex [sumatriptan], Penicillins, Ciprofloxacin, Macrobid [nitrofurantoin monohyd macro], and Sulfamethoxazole-trimethoprim    Review of Systems   Review of Systems  Constitutional:  Negative for appetite change, chills and fever.  HENT:  Positive for congestion, postnasal drip and sore throat.    Respiratory:  Positive for cough. Negative for shortness of breath and wheezing.   Cardiovascular:  Negative for chest pain.  Gastrointestinal:  Negative for nausea.  Neurological:  Negative for light-headedness.  All other systems reviewed and are negative.   Physical Exam Updated Vital Signs BP (!) 160/88 (BP Location: Left Arm)   Pulse 83   Temp 98.1 F (36.7 C) (Oral)   Resp 20   Ht _0  (1.549 m)   Wt 95.3 kg   LMP 05/21/2017 (Exact Date)   SpO2 95%   BMI 39.68 kg/m  Physical Exam Vitals and nursing note reviewed.  Constitutional:      General: She is not in acute distress.    Appearance: Normal appearance. She is not ill-appearing.  HENT:     Head: Normocephalic and atraumatic.     Nose: Nose normal.  Mouth/Throat:     Mouth: Mucous membranes are moist.     Pharynx: No oropharyngeal exudate or posterior oropharyngeal erythema.     Comments: No exudates.  No evidence of peritonsillar abscess, retropharyngeal abscess. Eyes:     General: No scleral icterus.    Extraocular Movements: Extraocular movements intact.     Conjunctiva/sclera: Conjunctivae normal.  Cardiovascular:     Rate and Rhythm: Normal rate and regular rhythm.     Pulses: Normal pulses.  Pulmonary:     Effort: Pulmonary effort is normal. No respiratory distress.     Breath sounds: Normal breath sounds. No wheezing or rales.  Musculoskeletal:        General: Normal range of motion.     Cervical back: Normal range of motion.  Skin:    General: Skin is warm and dry.  Neurological:     General: No focal deficit present.     Mental Status: She is alert and oriented to person, place, and time. Mental status is at baseline.     ED Results / Procedures / Treatments   Labs (all labs ordered are listed, but only abnormal results are displayed) Labs Reviewed  RESP PANEL BY RT-PCR (RSV, FLU A&B, COVID)  [id number]    EKG None  Radiology No results found.  Procedures Procedures     Medications Ordered in ED Medications - No data to display  ED Course/ Medical Decision Making/ A&P                           Medical Decision Making  56 year old female presents today for evaluation of URI symptoms ongoing for several days.  She does have history of COPD.  She does report increasing productive cough, and new onset of wheezing.  She has increased the use of her nebulizers and albuterol inhaler.  She does have sinus congestion.  Respiratory panel negative for COVID, flu, or RSV.  Maintaining her O2 sats on room air without difficulty.  Without conversational dyspnea.  Lung sounds are clear on auscultation.  Low suspicion for pneumonia however chest x-ray considered.  Kelly that we will treat her with Augmentin to cover for COPD exacerbation we discussed obtaining a chest x-ray however patient was in agreement to defer this Kelly she will be covered with antibiotics anyways.  We discussed doing a sinus rinse.  Kelly her cough is disruptive to her sleep we will give her Hycodan.  Return precautions discussed.  Patient voices understanding and is in agreement with plan.  Will also give her 5-day course of prednisone.  Patient is otherwise stable for discharge.  Discharged in stable condition.  Return precautions discussed.   Final Clinical Impression(s) / ED Diagnoses Final diagnoses:  COPD exacerbation ([place])    Rx / DC Orders ED Discharge Orders          Ordered    amoxicillin-clavulanate (AUGMENTIN) 875-125 MG tablet  Every 12 hours       Note to Pharmacy: Patient has penicillin allergy but has tolerated Augmentin in the past without difficulty.   10/29/22 1740    predniSONE (DELTASONE) 20 MG tablet  Daily with breakfast        10/29/22 1740    HYDROcodone bit-homatropine (HYCODAN) 5-1.5 MG/5ML syrup  Every 6 hours PRN        10/29/22 1740              Evlyn Courier, PA-C 10/29/22 1745    Ashok Cordia,  Lennette Bihari, MD 10/29/22 1901

## 2022-10-29 NOTE — Discharge Instructions (Signed)
Your exam today was overall reassuring.  Given your history of COPD, increase in your wheezing and cough we will treat you for COPD exacerbation.  However in addition to the antibiotic and prednisone that I am sending in for you I recommend that you use do a sinus rinse to clear out your sinuses this will help with decreasing the postnasal drip.  You can do warm salt water gargles, and cough drops for your sore throat.  We have sent in cough syrup for your cough.  For any concerning symptoms return to the department otherwise follow-up with your primary care provider.

## 2022-10-29 NOTE — ED Triage Notes (Signed)
C/o cough, sinus headache; with worst productive cough at night x 4 days; reports hx of COPD and asthma. Stated unable to head anything off of right ear.

## 2022-10-30 ENCOUNTER — Other Ambulatory Visit (HOSPITAL_COMMUNITY): Payer: Self-pay

## 2022-10-30 ENCOUNTER — Telehealth: Payer: Self-pay

## 2022-10-30 NOTE — Telephone Encounter (Signed)
I notified the patient. Nothing further needed. 

## 2022-10-30 NOTE — Telephone Encounter (Signed)
She needs to start the prednisone which will likely help.  Unfortunately most of the cough suppressants are on backorder.  She can try maximum strength Mucinex DM twice a day.

## 2022-10-30 NOTE — Telephone Encounter (Signed)
ED yesterday for COPD exacerbation.  SOB and cough started Wednesday. She tried doing her breathing treatments and they were not helping. She is still having the SOB and cough. Tessalon has not helped.  ED gave her Prednisone. Augmentin and Hycodan.  She did start the Augmentin yesterday but could not get the Prednisone filled until today. She has not picked it up yet.  The pharmacy is out of Hycodan.

## 2022-11-02 ENCOUNTER — Other Ambulatory Visit: Payer: Self-pay

## 2022-11-09 ENCOUNTER — Other Ambulatory Visit (HOSPITAL_COMMUNITY): Payer: Self-pay

## 2022-12-04 ENCOUNTER — Other Ambulatory Visit (HOSPITAL_COMMUNITY): Payer: Self-pay

## 2022-12-04 ENCOUNTER — Encounter: Payer: Self-pay | Admitting: Pulmonary Disease

## 2022-12-04 ENCOUNTER — Telehealth: Payer: Self-pay | Admitting: Pulmonary Disease

## 2022-12-04 ENCOUNTER — Ambulatory Visit: Payer: 59 | Admitting: Pulmonary Disease

## 2022-12-04 VITALS — BP 138/84 | HR 68 | Temp 98.2°F | Ht 61.0 in | Wt 205.2 lb

## 2022-12-04 DIAGNOSIS — J4550 Severe persistent asthma, uncomplicated: Secondary | ICD-10-CM

## 2022-12-04 DIAGNOSIS — G4733 Obstructive sleep apnea (adult) (pediatric): Secondary | ICD-10-CM | POA: Diagnosis not present

## 2022-12-04 DIAGNOSIS — R0602 Shortness of breath: Secondary | ICD-10-CM | POA: Diagnosis not present

## 2022-12-04 DIAGNOSIS — E669 Obesity, unspecified: Secondary | ICD-10-CM

## 2022-12-04 DIAGNOSIS — J4540 Moderate persistent asthma, uncomplicated: Secondary | ICD-10-CM

## 2022-12-04 LAB — NITRIC OXIDE: Nitric Oxide: 17

## 2022-12-04 NOTE — Patient Instructions (Signed)
The level of inflammation in your airway was low today which is good.  Your lungs sounded clear.  Your Dupixent.  Continue Breztri.  Continue your CPAP, your compliance is very good.  We will see you in follow-up in 4 months time call sooner should any new problems arise.

## 2022-12-04 NOTE — Progress Notes (Signed)
Subjective:    Patient ID: [name], female    DOB: 1966-02-13, 57 y.o.   MRN: [id number] Patient Care Team: Arlyce Dice, MD as PCP - General (Family Medicine) Tyler Pita, MD as Consulting Physician (Pulmonary Disease)  Chief Complaint  Patient presents with   Follow-up    ED on 10/29/22 for COPD exacerbation. SOB with exertion. Occasional wheezing. Cough with white sputum.    HPI Patient is a 57 year old former smoker with very minimal tobacco exposure in the past who presents for follow-up on the issue of moderate to severe persistent asthma with asthmatic bronchitis, cough and chronic rhinosinusitis.  This is a scheduled visit.  She was last evaluated on 05 July 2022. Since that visit she has had an exacerbation in January that was mild however she had to visit the emergency room.  This was on 1 January.  This was mostly driven by sinusitis.  She does note that [place] continues to help her significantly with her breathing issues and that her exacerbations are milder on the medication.  She continues to take [employer].  She recently switched insurances but continues to be able to get her [place] she will be up for reenrollment later this year. She states that she continues to do well with the [place] this has really helped her with control of her asthmatic exacerbations.  Since her exacerbation on 1 January she has not had any recent allergic rhinitis flares.  No wheezing.  No cough, sputum production or hemoptysis since her January 1 exacerbation. No orthopnea or paroxysmal nocturnal dyspnea.  No lower extremity edema.  Continues to have issues with weight management. She would benefit significantly from weight loss as this would also help her respiratory status.   She has obstructive sleep apnea chest been deemed in the past to be "mild" she is on auto CPAP at 5 to 20 cm H2O and compliant.  Notes that she cannot sleep without the device.  Median pressure is 9.6 cm H2O maximum  pressure is 15.4 95th percentile 13.7 residual AHI is 0.5 greater than 4 hours usage days is 22 days or 73% usage days is 28 out of 30 days or 93%.  The remainder days were less than 4 hours use was due to nasal congestion issues through her mild exacerbation.  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  This has reduced the level of stress.   Review of Systems A 10 point review of systems was performed and it is as noted above otherwise negative.  Patient Active Problem List   Diagnosis Date Noted   Post-viral cough syndrome 08/10/2022   Pruritic intertrigo 04/18/2022   Hyperlipidemia due to type 2 diabetes mellitus ([place]) 11/27/2021   Chronic mixed headache syndrome 06/05/2021   Back pain of lumbosacral region with sciatica 06/08/2020   Scar of scalp 11/13/2019   Restless leg syndrome 09/05/2017   Chronic migraine without aura with status migrainosus, not intractable 07/26/2017   Type 2 diabetes mellitus with hyperglycemia ([place]) 10/03/2015   HTN (hypertension) 10/03/2015   Fatigue 07/21/2015   Obstructive sleep apnea 09/08/2010   Hypercholesterolemia 09/20/2008   HOT FLASHES 09/20/2008   Depression with anxiety 12/26/2006   Asthma 12/26/2006   Social History   Tobacco Use   Smoking status: Former    Packs/day: 0.25    Years: 15.00    Total pack years: 3.75    Types: Cigarettes    Quit date: 02/16/2003    Years since quitting: 19.8  Smokeless tobacco: Never  Substance Use Topics   Alcohol use: Yes    Alcohol/week: 3.0 standard drinks of alcohol    Types: 3 Glasses of wine per week   Allergies  Allergen Reactions   Amitriptyline     Abnormal behavior. Just doesn't feel like normal self    Fluticasone-Salmeterol Other (See Comments)    Thrush even with mouth rinsing; worsens cough   Imitrex [Sumatriptan]     Made her head feel like its on fire   Penicillins Itching and Swelling    Has patient had a PCN reaction causing immediate rash, facial/tongue/throat swelling,  SOB or lightheadedness with hypotension: No Has patient had a PCN reaction causing severe rash involving mucus membranes or skin necrosis: No Has patient had a PCN reaction that required hospitalization: No Has patient had a PCN reaction occurring within the last 10 years: Yes If all of the above answers are "NO", then may proceed with Cephalosporin use.    Ciprofloxacin Itching and Rash   Macrobid [Nitrofurantoin Monohyd Macro] Itching   Sulfamethoxazole-Trimethoprim Rash   Current Meds  Medication Sig   albuterol (VENTOLIN HFA) 108 (90 Base) MCG/ACT inhaler Inhale 2 puffs into the lungs every 6 (six) hours as needed for wheezing or shortness of breath.   azelastine (ASTELIN) 0.1 % nasal spray Place 1 spray into both nostrils 2 (two) times daily. Use in each nostril as directed   benzonatate (TESSALON) 100 MG capsule Take 1 capsule (100 mg total) by mouth 2 (two) times daily as needed for cough.   BIOTIN PO Take 2,000 mg by mouth daily.   blood glucose meter kit and supplies KIT Use as directed.   Budeson-Glycopyrrol-Formoterol (BREZTRI AEROSPHERE) 160-9-4.8 MCG/ACT AERO Inhale 2 puffs into the lungs in the morning and at bedtime.   Budeson-Glycopyrrol-Formoterol (BREZTRI AEROSPHERE) 160-9-4.8 MCG/ACT AERO Inhale 2 puffs into the lungs in the morning and at bedtime.   cetirizine (ZYRTEC) 10 MG tablet Take 1 tablet (10 mg total) by mouth daily.   Dupilumab (DUPIXENT) 300 MG/2ML SOPN Inject 300 mg into the skin every 14 (fourteen) days. Loading dose of '600mg'$  received on 11/07/21   Elastic Bandages & Supports (SHOULDER BRACE LARGE) MISC 1 each by Does not apply route as needed.   esomeprazole (NEXIUM) 40 MG capsule Take 1 capsule (40 mg total) by mouth daily.   estradiol (ESTRACE VAGINAL) 0.1 MG/GM vaginal cream Place 1 g vaginally daily for 2 weeks, then use twice weekly thereafter.   fluorometholone (FML) 0.1 % ophthalmic suspension Place 1 drop into both eyes in the morning and at bedtime.    fluticasone (FLONASE) 50 MCG/ACT nasal spray Place 2 sprays into both nostrils daily.   Fremanezumab-vfrm (AJOVY) 225 MG/1.5ML SOAJ Inject 225 mg into the skin every 30 (thirty) days.   gabapentin (NEURONTIN) 300 MG capsule Take 1 capsule (300 mg total) by mouth every 8 (eight) hours as needed for pain   glucose blood test strip Use as directed 2 times daily.   Lancets (FREESTYLE) lancets Use as directed 2 times daily.   levalbuterol (XOPENEX) 1.25 MG/3ML nebulizer solution Take 1.25 mg by nebulization every 8 (eight) hours as needed for wheezing or shortness of breath.   losartan-hydrochlorothiazide (HYZAAR) 100-25 MG tablet Take 1 tablet by mouth daily.   Multiple Vitamin (MULTIVITAMIN WITH MINERALS) TABS tablet Take 1 tablet by mouth daily.   naproxen (NAPROSYN) 500 MG tablet Take 1 tablet (500 mg total) by mouth 2 (two) times daily as needed for mild or  moderate pain   nystatin (MYCOSTATIN/NYSTOP) powder Apply 1 application topically 3 (three) times daily.   oxybutynin (DITROPAN XL) 15 MG 24 hr tablet Take 1 tablet (15 mg total) by mouth at bedtime.   Polyethyl Glycol-Propyl Glycol (SYSTANE OP) Place 1 drop into both eyes in the morning and at bedtime.   Pyridoxine HCl (VITAMIN B-6 PO) Take 1 tablet by mouth daily. Unknown OTC strength   Rimegepant Sulfate (NURTEC) 75 MG TBDP Take 75 mg by mouth daily as needed (take for abortive therapy of migraine, no more than 1 tablet in 24 hours or 10 per month).   rosuvastatin (CRESTOR) 10 MG tablet Take 1 tablet (10 mg total) by mouth daily.   Semaglutide,0.25 or 0.'5MG'$ /DOS, 2 MG/3ML SOPN Inject 0.25 mg into the skin once a week for 4 weeks, then increase to 0.5 mg weekly for at least 4 weeks.   sertraline (ZOLOFT) 50 MG tablet Take 1 tablet (50 mg total) by mouth daily.   Spacer/Aero-Holding Chambers (AEROCHAMBER MV) inhaler Use as instructed   vitamin C (ASCORBIC ACID) 500 MG tablet Take 500 mg by mouth daily.        Objective:   Physical Exam BP  138/84 (BP Location: Left Arm, Cuff Size: Large)   Pulse 68   Temp 98.2 F (36.8 C)   Ht '5\' 1"'$  (1.549 m)   Wt 205 lb 3.2 oz (93.1 kg)   LMP 05/21/2017 (Exact Date)   SpO2 99%   BMI 38.77 kg/m   SpO2: 99 % O2 Device: None (Room air)  GENERAL: Obese woman, no acute distress, fully ambulatory.  No conversational dyspnea. HEAD: Normocephalic, atraumatic.  EYES: Pupils equal, round, reactive to light.  No scleral icterus.  MOUTH: Nose/mouth/throat not examined due to institutional masking requirements. NECK: Thick neck, supple. No thyromegaly. Trachea midline. No JVD.  No adenopathy. PULMONARY: Good air entry bilaterally.  No adventitious sounds. CARDIOVASCULAR: S1 and S2. Regular rate and rhythm.  No rubs, murmurs or gallops heard. ABDOMEN: Obese. MUSCULOSKELETAL: No joint deformity, no clubbing, no edema.  NEUROLOGIC: No focal deficit, no gait disturbance, speech is fluent. SKIN: Intact,warm,dry. PSYCH: Mood and behavior subdued.   Lab Results  Component Value Date   NITRICOXIDE 17 12/04/2022  *No evidence of type II inflammation.     Assessment & Plan:     ICD-10-CM   1. Severe persistent asthma in adult without complication  Q67.61 Nitric oxide   Has element of asthmatic bronchitis as well Continue Breztri 2 puffs twice a day Continue Dupixent Continue as needed albuterol    2. OSA on CPAP  G47.33    Compliant with therapy Continue same    3. Obesity with body mass index (BMI) of 30.0 to 39.9  E66.9    Weight loss recommended     Patient has been instructed to continue her medications as they currently are.  Her CPAP compliance was very good and she was encouraged to continue using the CPAP device.  Will see her in follow-up in 4 months time she is to contact us prior to that time should any new difficulties arise.  Renold Don, MD Advanced Bronchoscopy P[facility]    *This note was dictated using voice recognition software/Dragon.   Despite best efforts to proofread, errors can occur which can change the meaning. Any transcriptional errors that result from this process are unintentional and may not be fully corrected at the time of dictation.

## 2022-12-04 NOTE — Telephone Encounter (Signed)
Pharmacy team, please advise. Thanks

## 2022-12-05 ENCOUNTER — Other Ambulatory Visit (HOSPITAL_COMMUNITY): Payer: Self-pay

## 2022-12-05 MED ORDER — DUPIXENT 300 MG/2ML ~~LOC~~ SOAJ: 300.0000 mg | SUBCUTANEOUS | 1 refills | Status: DC

## 2022-12-05 NOTE — Telephone Encounter (Addendum)
If patient has had change in insurance, will need PA submitted through new plan for Dupixent.  Submitted an URGENT Prior Authorization request to [facility] for [place] via CoverMyMeds. Will need to run test claim and update patient once we receive a response.  Key: [id number]  [name], PharmD, MPH, BCPS, CPP Clinical Pharmacist (Rheumatology and Pulmonology)

## 2022-12-05 NOTE — Telephone Encounter (Signed)
Received notification from [facility] regarding a prior authorization for [place]. Authorization has been APPROVED from 12/05/2022 to 12/06/2023. Approval letter sent to scan center.  Unable to run test claim as patient must fill with [facility].   Authorization # [id number]  Rx sent to [facility] today. Patient emailed with pharmacy phone number and copay card information. She states that she has another copay card at home. Advised her to provide that information to pharmacy and then if that doesn't work to provide the copay card information that I emailed her. She has my direct number to reach out with any issues. She will call pharmacy on Friday to set up shipment  [facility] notified to deactivate patient as she no longer works for [employer] and will no longer be able to fill with [place]. She has [employer] plan she bought from [employer].  Knox Saliva, PharmD, MPH, BCPS, CPP Clinical Pharmacist (Rheumatology and Pulmonology)

## 2022-12-14 ENCOUNTER — Other Ambulatory Visit (HOSPITAL_COMMUNITY): Payer: Self-pay

## 2022-12-14 ENCOUNTER — Telehealth: Payer: Self-pay | Admitting: Pulmonary Disease

## 2022-12-14 ENCOUNTER — Other Ambulatory Visit: Payer: Self-pay | Admitting: Pulmonary Disease

## 2022-12-14 ENCOUNTER — Encounter: Payer: Self-pay | Admitting: Pharmacist

## 2022-12-14 ENCOUNTER — Other Ambulatory Visit: Payer: Self-pay

## 2022-12-14 DIAGNOSIS — J4540 Moderate persistent asthma, uncomplicated: Secondary | ICD-10-CM

## 2022-12-14 NOTE — Telephone Encounter (Signed)
Returned call to patient. Dupixent has to be filled by [facility]. She states she never called pharmacy and never heard from pharmacy. I discussed htat per our conversation, she was to call the pharmacy herself to set up shipment. She states [facility] will [facility].  She did keep going back and forth between inhalers and Dupixent and I had to redirect to ensure I was providing correct information. She is stating that [facility] can't fill her inhaler either. Will notify PA team  I've sent email back to patient today advising that we will follow-up in regards to inhaler  Knox Saliva, PharmD, MPH, BCPS, CPP Clinical Pharmacist (Rheumatology and Pulmonology)

## 2022-12-14 NOTE — Telephone Encounter (Signed)
Error

## 2022-12-17 ENCOUNTER — Other Ambulatory Visit (HOSPITAL_COMMUNITY): Payer: Self-pay

## 2022-12-17 MED ORDER — TRELEGY ELLIPTA 200-62.5-25 MCG/ACT IN AEPB
1.0000 | INHALATION_SPRAY | Freq: Every day | RESPIRATORY_TRACT | 11 refills | Status: DC
  Filled 2022-12-17: qty 60, 30d supply, fill #0
  Filled 2023-02-05: qty 60, 30d supply, fill #1
  Filled 2023-03-06: qty 60, 30d supply, fill #2
  Filled 2023-04-11: qty 60, 30d supply, fill #3
  Filled 2023-05-15: qty 60, 30d supply, fill #4
  Filled 2023-07-12: qty 60, 30d supply, fill #5
  Filled 2023-08-15: qty 60, 30d supply, fill #6
  Filled 2023-10-02: qty 60, 30d supply, fill #7
  Filled 2023-11-12: qty 60, 30d supply, fill #8

## 2022-12-17 NOTE — Telephone Encounter (Signed)
Can switch to Trelegy 200, 1 puff daily, rinse mouth well after use.

## 2022-12-17 NOTE — Telephone Encounter (Signed)
Breztri not covered under patients current insurance, per test claim other triple therapy Trelegy is covered at this time. Please d/c Breztri and send in new script for Trelegy if appropriate, thank you!

## 2022-12-17 NOTE — Telephone Encounter (Signed)
Dr. Patsey Berthold, please see below message and advise.

## 2022-12-17 NOTE — Telephone Encounter (Signed)
Trelegy 200 sent to preferred pharmacy. Patient is aware of change and voiced her understanding.  Nothing further needed.

## 2022-12-18 ENCOUNTER — Other Ambulatory Visit (HOSPITAL_COMMUNITY): Payer: Self-pay

## 2022-12-20 ENCOUNTER — Other Ambulatory Visit: Payer: Self-pay | Admitting: Obstetrics and Gynecology

## 2022-12-20 ENCOUNTER — Ambulatory Visit (INDEPENDENT_AMBULATORY_CARE_PROVIDER_SITE_OTHER): Payer: 59 | Admitting: Family Medicine

## 2022-12-20 ENCOUNTER — Other Ambulatory Visit (HOSPITAL_COMMUNITY): Payer: Self-pay

## 2022-12-20 ENCOUNTER — Encounter: Payer: Self-pay | Admitting: Family Medicine

## 2022-12-20 VITALS — BP 141/80 | HR 71 | Ht 61.0 in | Wt 208.8 lb

## 2022-12-20 DIAGNOSIS — N390 Urinary tract infection, site not specified: Secondary | ICD-10-CM

## 2022-12-20 DIAGNOSIS — E1165 Type 2 diabetes mellitus with hyperglycemia: Secondary | ICD-10-CM

## 2022-12-20 DIAGNOSIS — R350 Frequency of micturition: Secondary | ICD-10-CM

## 2022-12-20 DIAGNOSIS — L304 Erythema intertrigo: Secondary | ICD-10-CM | POA: Diagnosis not present

## 2022-12-20 DIAGNOSIS — R8290 Unspecified abnormal findings in urine: Secondary | ICD-10-CM

## 2022-12-20 DIAGNOSIS — N898 Other specified noninflammatory disorders of vagina: Secondary | ICD-10-CM

## 2022-12-20 DIAGNOSIS — R69 Illness, unspecified: Secondary | ICD-10-CM | POA: Diagnosis not present

## 2022-12-20 DIAGNOSIS — F418 Other specified anxiety disorders: Secondary | ICD-10-CM | POA: Diagnosis not present

## 2022-12-20 DIAGNOSIS — N3281 Overactive bladder: Secondary | ICD-10-CM

## 2022-12-20 LAB — POCT URINALYSIS DIP (MANUAL ENTRY)
Bilirubin, UA: NEGATIVE
Blood, UA: NEGATIVE
Glucose, UA: NEGATIVE mg/dL
Ketones, POC UA: NEGATIVE mg/dL
Leukocytes, UA: NEGATIVE
Nitrite, UA: NEGATIVE
Protein Ur, POC: NEGATIVE mg/dL
Spec Grav, UA: 1.015 (ref 1.010–1.025)
Urobilinogen, UA: 0.2 E.U./dL
pH, UA: 6.5 (ref 5.0–8.0)

## 2022-12-20 LAB — POCT GLYCOSYLATED HEMOGLOBIN (HGB A1C): HbA1c, POC (controlled diabetic range): 7.6 % — AB (ref 0.0–7.0)

## 2022-12-20 MED ORDER — FREESTYLE LANCETS MISC
0 refills | Status: DC
  Filled 2022-12-20: qty 100, 30d supply, fill #0

## 2022-12-20 MED ORDER — GLUCOSE BLOOD VI STRP
ORAL_STRIP | 0 refills | Status: DC
  Filled 2022-12-20: qty 100, 50d supply, fill #0

## 2022-12-20 MED ORDER — SERTRALINE HCL 50 MG PO TABS
50.0000 mg | ORAL_TABLET | Freq: Every day | ORAL | 3 refills | Status: DC
  Filled 2022-12-20: qty 30, 30d supply, fill #0
  Filled 2023-02-05: qty 30, 30d supply, fill #1
  Filled 2023-03-06: qty 30, 30d supply, fill #2
  Filled 2023-05-15: qty 30, 30d supply, fill #3
  Filled 2023-07-12: qty 30, 30d supply, fill #4
  Filled 2023-08-15: qty 30, 30d supply, fill #5
  Filled 2023-10-02: qty 30, 30d supply, fill #6
  Filled 2023-11-12: qty 30, 30d supply, fill #7

## 2022-12-20 MED ORDER — OXYBUTYNIN CHLORIDE ER 15 MG PO TB24
15.0000 mg | ORAL_TABLET | Freq: Every day | ORAL | 1 refills | Status: DC
  Filled 2022-12-20: qty 30, 30d supply, fill #0
  Filled 2023-02-05: qty 30, 30d supply, fill #1
  Filled 2023-03-06: qty 30, 30d supply, fill #2
  Filled 2023-05-15: qty 30, 30d supply, fill #3
  Filled 2023-07-12: qty 30, 30d supply, fill #4
  Filled 2023-08-15: qty 30, 30d supply, fill #5

## 2022-12-20 NOTE — Patient Instructions (Addendum)
Good to see you today - Thank you for coming in  Things we discussed today:  1) For your urinary symptoms,  - I sent a refill for your oxybutynin   - Let's check your urine  - If you continue to have symptoms, we can discuss next steps such as pelvic floor physical therapy or switching to new medications.  2) I have also sent a refill for your Sertraline, glucose strips, and lancets.   3) Make an appointment to get a diabetic eye exam  Please always bring your medication bottles  Come back to see me in 1 month to follow-up on your urinary symptoms.

## 2022-12-20 NOTE — Progress Notes (Signed)
    SUBJECTIVE:   CHIEF COMPLAINT / HPI:   [name] is a 57yo F p/f physical  Urinary Odor Reports ~1 wk hx of strong urine smell. Also feels some low back pressure with urinating. Denies dysuria, frequency. Reports that this feels like her past UTI's. Used to take oxybutynin but ran out.  Need refill of Sertraline - lancets and strips   PERTINENT  PMH / PSH: T2DM, HTN, Asthma, OSA  OBJECTIVE:   BP (!) 141/80   Pulse 71   Ht 5' 1"$  (1.549 m)   Wt 208 lb 12.8 oz (94.7 kg)   LMP 05/21/2017 (Exact Date)   SpO2 99%   BMI 39.45 kg/m   Gen: Alert, sitting up in chair. NAD Resp: CTAB. Normal WOB on RA CV: RRR Msk: soreness to percussion of lower back, but no CVA tenderness.  Derm: Hyperpigmentated skin under breast and between breast with overlying white powder (nystatin powder)   ASSESSMENT/PLAN:   Type 2 diabetes mellitus with hyperglycemia ([place]) Due for diabetic eye exam. Pt plans to make appointment. A1c 7.6 today, slightly up from prior.   Malodorous urine Reports 1 wk hx of strong smelling urine and low back pressure when urinating. Denies dysuria or frequency. Afebrile. No CVA tenderness on exam.  - UA today not c/f infection. Antibx are not indicated - Discussed that symptoms may be more related to incontinence. Provided refill of oxybutinin and plan to follow-up on symptoms in 1 month.  - Consider switching to mirabegron and/or referring to pelvic floor PT  Pruritic intertrigo Was better for past few months but recently getting itchy again in breast folds.  - Cont Nystatin powder as needed - Can consider short-course diflucan if rash persists.     Arlyce Dice, MD [place]

## 2022-12-21 ENCOUNTER — Other Ambulatory Visit (HOSPITAL_COMMUNITY): Payer: Self-pay

## 2022-12-22 DIAGNOSIS — R8290 Unspecified abnormal findings in urine: Secondary | ICD-10-CM | POA: Insufficient documentation

## 2022-12-22 NOTE — Assessment & Plan Note (Signed)
Due for diabetic eye exam. Pt plans to make appointment. A1c 7.6 today, slightly up from prior.

## 2022-12-22 NOTE — Assessment & Plan Note (Signed)
Reports 1 wk hx of strong smelling urine and low back pressure when urinating. Denies dysuria or frequency. Afebrile. No CVA tenderness on exam.  - UA today not c/f infection. Antibx are not indicated - Discussed that symptoms may be more related to incontinence. Provided refill of oxybutinin and plan to follow-up on symptoms in 1 month.  - Consider switching to mirabegron and/or referring to pelvic floor PT

## 2022-12-22 NOTE — Assessment & Plan Note (Signed)
Was better for past few months but recently getting itchy again in breast folds.  - Cont Nystatin powder as needed - Can consider short-course diflucan if rash persists.

## 2022-12-24 DIAGNOSIS — J45901 Unspecified asthma with (acute) exacerbation: Secondary | ICD-10-CM | POA: Diagnosis not present

## 2022-12-24 DIAGNOSIS — G4733 Obstructive sleep apnea (adult) (pediatric): Secondary | ICD-10-CM | POA: Diagnosis not present

## 2023-01-10 DIAGNOSIS — H43811 Vitreous degeneration, right eye: Secondary | ICD-10-CM | POA: Diagnosis not present

## 2023-01-10 DIAGNOSIS — H43391 Other vitreous opacities, right eye: Secondary | ICD-10-CM | POA: Diagnosis not present

## 2023-01-17 ENCOUNTER — Ambulatory Visit (INDEPENDENT_AMBULATORY_CARE_PROVIDER_SITE_OTHER): Payer: 59 | Admitting: Family Medicine

## 2023-01-17 ENCOUNTER — Other Ambulatory Visit (HOSPITAL_COMMUNITY): Payer: Self-pay

## 2023-01-17 ENCOUNTER — Telehealth: Payer: Self-pay | Admitting: Pharmacy Technician

## 2023-01-17 ENCOUNTER — Encounter: Payer: Self-pay | Admitting: Family Medicine

## 2023-01-17 VITALS — BP 165/85 | HR 68 | Ht 61.0 in | Wt 207.4 lb

## 2023-01-17 DIAGNOSIS — F4322 Adjustment disorder with anxiety: Secondary | ICD-10-CM

## 2023-01-17 DIAGNOSIS — F419 Anxiety disorder, unspecified: Secondary | ICD-10-CM | POA: Diagnosis not present

## 2023-01-17 DIAGNOSIS — I10 Essential (primary) hypertension: Secondary | ICD-10-CM

## 2023-01-17 MED ORDER — BUSPIRONE HCL 5 MG PO TABS
7.5000 mg | ORAL_TABLET | Freq: Two times a day (BID) | ORAL | 1 refills | Status: DC
  Filled 2023-01-17 – 2023-02-05 (×2): qty 90, 30d supply, fill #0
  Filled 2023-03-06: qty 90, 30d supply, fill #1

## 2023-01-17 NOTE — Progress Notes (Signed)
    SUBJECTIVE:   CHIEF COMPLAINT / HPI:   IG is a 57yo F p/f anxiety and decreased sleep.   Was under investigation in November for her job, was falsely accused of something and been out of work since then. This has led to a lot of financial stressors. Feels like she was fired for no reason, feels humiliated, and lost her livelihood. Husband not being supportive. Feels safe at home. Also feels betrayed that her ex-coworkers. Also, having to take care of her grandkids.   Reports occasional episodes of panic that last ~25 min, associated with SOB, chest pressure. She drives somewhere to be alone and cries.  Reports poor sleep, frequently waking up throughout the night.  Denies SI or any plan. Mistakenly circled 2 for question 9 on PHQ-9. Clarifies that she sometimes wants to "drive off and disappear" but not actually wanting to die or hurt herself. Grandkids and sons are protective factors.  PERTINENT  PMH / PSH: hx of depression w/ anxiety  OBJECTIVE:   BP (!) 165/85   Pulse 68   Ht 5\' 1"  (1.549 m)   Wt 207 lb 6 oz (94.1 kg)   LMP 05/21/2017 (Exact Date)   SpO2 99%   BMI 39.18 kg/m   Gen: Middle-aged woman sitting in chair. Psych: tearful, anxious. Denies SI or HI.  HEENT: NCAT. MMM. Resp: Normal WOB on RA  ASSESSMENT/PLAN:   Adjustment disorder with anxiety Had major life stressors since November 2023 (~49months), and has had worsening anxiety and decreased sleep during this time. GAD-7 score 18 today. PHQ-9 score 19. Denies SI/HI. Kids and grandkids are protective factors. Pt agreeable to pursuing therapy and medications concurrently. Currently on zoloft 50 daily. Plan to augment with Buspar.  - Cont Zoloft 50 daily - Start Buspar 7.5 BID - Discussed getting connected with therapist. Provided list of local providers - f/u in 1 month. Agreeable to frequent follow-up while pt is still finding therapist.   HTN (hypertension) BP elevated today. Pt very anxious and tearful, so  BP may be falsely elevated. Will recheck at f/u.     Arlyce Dice, MD [place]

## 2023-01-17 NOTE — Patient Instructions (Signed)
Good to see you today - Thank you for coming in  Things we discussed today:  1) For your anxiety and sleep issues: - Start taking Buspar 7.5mg  (1 and a half tablets) twice a day. - Continue Zoloft 50 daily - Get connected with a therapist. You can check your insurance for a preferred therapy provider that they will cover.   Please always bring your medication bottles  Come back to see me in the next month to continue checking in on mental health.   To see a psychiatry provider, please contact:  [facility]  [address] [place], [place] [facility] [phone]    To schedule with a psychiatrist, please contact your insurance company to find an International aid/development worker.

## 2023-01-17 NOTE — Telephone Encounter (Signed)
Patient Advocate Encounter   Received notification that prior authorization for AJOVY (fremanezumab-vfrm) injection 225MG /1.5ML auto-injectors is required.   PA submitted on 01/17/2023 [place] Medicare Electronic PA Form Status is pending       Lyndel Safe, [place] Patient Advocate Specialist [place] Patient Advocate Team Direct Number: [phone]  Fax: [phone]

## 2023-01-18 DIAGNOSIS — F4322 Adjustment disorder with anxiety: Secondary | ICD-10-CM | POA: Insufficient documentation

## 2023-01-18 NOTE — Assessment & Plan Note (Signed)
BP elevated today. Pt very anxious and tearful, so BP may be falsely elevated. Will recheck at f/u.

## 2023-01-18 NOTE — Assessment & Plan Note (Signed)
Had major life stressors since November 2023 (~1months), and has had worsening anxiety and decreased sleep during this time. GAD-7 score 18 today. PHQ-9 score 19. Denies SI/HI. Kids and grandkids are protective factors. Pt agreeable to pursuing therapy and medications concurrently. Currently on zoloft 50 daily. Plan to augment with Buspar.  - Cont Zoloft 50 daily - Start Buspar 7.5 BID - Discussed getting connected with therapist. Provided list of local providers - f/u in 1 month. Agreeable to frequent follow-up while pt is still finding therapist.

## 2023-01-21 NOTE — Telephone Encounter (Signed)
[name], does not appear she has been on either Emgality or Aimovig which are on her formulary. Would you like to switch to one of these?

## 2023-01-21 NOTE — Telephone Encounter (Signed)
Patient Advocate Encounter  Received a fax from [place] regarding Prior Authorization for AJOVY (fremanezumab-vfrm) injection 225MG /1.5ML auto-injectors .   Authorization has been DENIED due to    Full determination letter attached to patient chart

## 2023-01-22 ENCOUNTER — Other Ambulatory Visit (HOSPITAL_COMMUNITY): Payer: Self-pay

## 2023-01-22 DIAGNOSIS — J45901 Unspecified asthma with (acute) exacerbation: Secondary | ICD-10-CM | POA: Diagnosis not present

## 2023-01-22 DIAGNOSIS — G4733 Obstructive sleep apnea (adult) (pediatric): Secondary | ICD-10-CM | POA: Diagnosis not present

## 2023-01-22 MED ORDER — EMGALITY 120 MG/ML ~~LOC~~ SOAJ
120.0000 mg | SUBCUTANEOUS | 3 refills | Status: DC
  Filled 2023-01-22 – 2023-03-07 (×2): qty 1, 30d supply, fill #0
  Filled 2023-05-15: qty 1, 30d supply, fill #1
  Filled 2023-07-12: qty 1, 30d supply, fill #2
  Filled 2023-08-15: qty 1, 30d supply, fill #3
  Filled 2023-10-02: qty 1, 30d supply, fill #4
  Filled 2023-11-12 – 2024-01-03 (×3): qty 1, 30d supply, fill #5

## 2023-01-23 NOTE — Telephone Encounter (Signed)
Called and spoke with pt. Advised Ajovy denied, Amy Lomax,NP called in Emgality instead for her since this is a covered option with new insurance. Aware it may require PA. She verbalized understanding. She will f/u with pharmacy.

## 2023-01-25 ENCOUNTER — Other Ambulatory Visit (HOSPITAL_COMMUNITY): Payer: Self-pay

## 2023-01-28 ENCOUNTER — Other Ambulatory Visit (HOSPITAL_COMMUNITY): Payer: Self-pay

## 2023-01-28 ENCOUNTER — Telehealth: Payer: Self-pay | Admitting: *Deleted

## 2023-01-28 DIAGNOSIS — F418 Other specified anxiety disorders: Secondary | ICD-10-CM

## 2023-01-28 NOTE — Telephone Encounter (Signed)
Patient left message on referral line stating she needed a referral to schedule an appt with behavioral health.  Will forward to MD.  Please place the name of office you informed her of at her visit in the comment section.  Tameeka Luo,CMA

## 2023-01-30 NOTE — Telephone Encounter (Signed)
Attempted to call patient about which office she called for behavioral health but voicemail was full.  Please ask during her upcoming visit on 02/04/23.  Thanks [employer]

## 2023-02-04 ENCOUNTER — Encounter: Payer: Self-pay | Admitting: Family Medicine

## 2023-02-04 ENCOUNTER — Other Ambulatory Visit: Payer: Self-pay

## 2023-02-04 ENCOUNTER — Ambulatory Visit (INDEPENDENT_AMBULATORY_CARE_PROVIDER_SITE_OTHER): Payer: 59 | Admitting: Family Medicine

## 2023-02-04 VITALS — BP 154/81 | HR 62 | Ht 61.0 in | Wt 206.8 lb

## 2023-02-04 DIAGNOSIS — L304 Erythema intertrigo: Secondary | ICD-10-CM

## 2023-02-04 DIAGNOSIS — I10 Essential (primary) hypertension: Secondary | ICD-10-CM | POA: Diagnosis not present

## 2023-02-04 DIAGNOSIS — F4322 Adjustment disorder with anxiety: Secondary | ICD-10-CM | POA: Diagnosis not present

## 2023-02-04 NOTE — Patient Instructions (Signed)
Good to see you today - Thank you for coming in  Things we discussed today:  Please make an appointment with a therapist with the list of providers from your insurance.  Please always bring your medication bottles  Come back to see me in the next 1-2 months to follow-up on this. Make an appointment sooner if you need!
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
## 2023-02-05 ENCOUNTER — Telehealth: Payer: Self-pay

## 2023-02-05 ENCOUNTER — Other Ambulatory Visit: Payer: Self-pay

## 2023-02-05 ENCOUNTER — Other Ambulatory Visit: Payer: Self-pay | Admitting: Family Medicine

## 2023-02-05 ENCOUNTER — Other Ambulatory Visit (HOSPITAL_COMMUNITY): Payer: Self-pay

## 2023-02-05 ENCOUNTER — Other Ambulatory Visit: Payer: Self-pay | Admitting: Pharmacist

## 2023-02-05 DIAGNOSIS — M5440 Lumbago with sciatica, unspecified side: Secondary | ICD-10-CM

## 2023-02-05 DIAGNOSIS — R102 Pelvic and perineal pain: Secondary | ICD-10-CM

## 2023-02-05 DIAGNOSIS — L304 Erythema intertrigo: Secondary | ICD-10-CM

## 2023-02-05 MED ORDER — NYSTATIN 100000 UNIT/GM EX POWD
1.0000 | Freq: Three times a day (TID) | CUTANEOUS | 0 refills | Status: DC
Start: 2023-02-05 — End: 2023-05-15
  Filled 2023-02-05: qty 60, 15d supply, fill #0

## 2023-02-05 MED ORDER — FLUCONAZOLE 150 MG PO TABS
150.0000 mg | ORAL_TABLET | Freq: Once | ORAL | 0 refills | Status: AC
Start: 2023-02-05 — End: 2023-02-06
  Filled 2023-02-05: qty 1, 1d supply, fill #0

## 2023-02-05 NOTE — Progress Notes (Signed)
Patient outreached by Wynn Maudlin, PharmD Candidate on 02/05/23 to discuss hypertension   Patient has an automated home blood pressure machine. She could not recall specific BP readings, but stated her BP is usually "130 or 140 over something."   Medication review was performed. They are not taking medications as prescribed. Patient reports forgetting to take losartan-HCTZ on Sunday night prior to appointment on 02/04/23. Patient reports taking 1 tablet by mouth 2 times daily occasionally when home BP reading is high. Current strength of losartan-HCTZ is max dose at 1 tablet daily.   The following barriers to adherence were noted:  - They do have cost concerns. Discussed Emgality savings card with patient, as medication is $150 after insurance which the patient cannot afford. Upon ending the call with the patient, I checked with the pharmacy staff at [facility] who confirmed the PA for [facility] has not yet been approved. Called patient back to inform her of next steps.  - They do not have transportation concerns.  - They do not need assistance obtaining refills.  - They do occasionally forget to take some of their prescribed medications.  - They do not feel like one/some of their medications make them feel poorly.  - They do not have questions or concerns about their medications.  - They do have follow up scheduled with their primary care provider/cardiologist.   The following interventions were completed:  - Medications were reviewed  - Patient was educated on goal blood pressures and long term health implications of elevated blood pressure  - Patient was educated on proper technique to check home blood pressure and reminded to bring home machine and readings to next provider appointment  - Patient was educated on medications, including indication and administration  - Patient was educated on use of adherence strategies, like a pill box or alarms  - Patient was counseled on lifestyle  modifications to improve blood pressure, including DASH diet, reduction in sodium intake, and physical activity. Patient reports she has been watching her diet and has been eating whole grains and yogurt. Patient states that her BP has been elevated due to stress. She is trying to find a job because she currently only works a few days a week due to losing previous job. She has been under a lot of stress but has an appointment scheduled with a therapist next Wednesday as recommended by PCP.    The patient has follow up scheduled: 02/18/23  PCP: Dr. Daleen Bo, PharmD Candidate   Catie Eppie Gibson, PharmD, BCACP, CPP [facility] [phone]

## 2023-02-05 NOTE — Telephone Encounter (Signed)
Patient calls nurse line regarding yesterday's office visit. Patient reports that she discussed with provider receiving refills on Diflucan and antifungal powder.   Will forward to PCP for further advisement.   Veronda Prude, RN

## 2023-02-05 NOTE — Assessment & Plan Note (Signed)
Did not take meds today. - Encouraged to take medications before next visit to have an accurate BP reading. Current life stressors may be contributing to medication adherence barriers.

## 2023-02-05 NOTE — Assessment & Plan Note (Signed)
Denies SI. Buspar was causing some nausea, but overall tolerating well. Having trouble getting connected with therapy.  - Discussed plan to reach out to therapist from insurance - Safety plan discussed. Aunt is available to call if she ever feels like she is in crisis. - Cont Buspar and Sertraline

## 2023-02-05 NOTE — Assessment & Plan Note (Signed)
Reports flare of itchy rash under breasts. Nystatin not helping. Will do one dose diflucan.

## 2023-02-06 ENCOUNTER — Other Ambulatory Visit (HOSPITAL_COMMUNITY): Payer: Self-pay

## 2023-02-07 ENCOUNTER — Other Ambulatory Visit (HOSPITAL_COMMUNITY): Payer: Self-pay

## 2023-02-07 DIAGNOSIS — H43811 Vitreous degeneration, right eye: Secondary | ICD-10-CM | POA: Diagnosis not present

## 2023-02-07 DIAGNOSIS — H43391 Other vitreous opacities, right eye: Secondary | ICD-10-CM | POA: Diagnosis not present

## 2023-02-07 MED ORDER — GABAPENTIN 300 MG PO CAPS
300.0000 mg | ORAL_CAPSULE | Freq: Three times a day (TID) | ORAL | 2 refills | Status: DC
  Filled 2023-02-07: qty 90, 30d supply, fill #0
  Filled 2023-05-15: qty 90, 30d supply, fill #1
  Filled 2023-07-12: qty 90, 30d supply, fill #2

## 2023-02-07 MED ORDER — NAPROXEN 500 MG PO TABS
500.0000 mg | ORAL_TABLET | Freq: Two times a day (BID) | ORAL | 0 refills | Status: DC | PRN
Start: 2023-02-07 — End: 2023-05-15
  Filled 2023-02-07: qty 60, 30d supply, fill #0

## 2023-02-08 ENCOUNTER — Other Ambulatory Visit (HOSPITAL_COMMUNITY): Payer: Self-pay

## 2023-02-11 ENCOUNTER — Other Ambulatory Visit (HOSPITAL_COMMUNITY): Payer: Self-pay

## 2023-02-13 DIAGNOSIS — F3289 Other specified depressive episodes: Secondary | ICD-10-CM | POA: Diagnosis not present

## 2023-02-13 DIAGNOSIS — F418 Other specified anxiety disorders: Secondary | ICD-10-CM | POA: Diagnosis not present

## 2023-02-18 ENCOUNTER — Ambulatory Visit: Payer: 59 | Admitting: Family Medicine

## 2023-02-19 ENCOUNTER — Other Ambulatory Visit (HOSPITAL_COMMUNITY): Payer: Self-pay

## 2023-02-19 ENCOUNTER — Encounter (HOSPITAL_COMMUNITY): Payer: Self-pay

## 2023-02-19 ENCOUNTER — Telehealth: Payer: Self-pay | Admitting: Family Medicine

## 2023-02-19 NOTE — Telephone Encounter (Signed)
Katrina called from [facility]. Stated pt need PA for Galcanezumab-gnlm [facility]) 120 MG/ML SOAJ

## 2023-02-21 ENCOUNTER — Ambulatory Visit
Admission: EM | Admit: 2023-02-21 | Discharge: 2023-02-21 | Disposition: A | Discharge status: Home / Self Care | Payer: 59 | Attending: Internal Medicine | Admitting: Internal Medicine

## 2023-02-21 ENCOUNTER — Other Ambulatory Visit (HOSPITAL_COMMUNITY): Payer: Self-pay

## 2023-02-21 DIAGNOSIS — N3000 Acute cystitis without hematuria: Secondary | ICD-10-CM | POA: Insufficient documentation

## 2023-02-21 LAB — POCT URINALYSIS DIP (MANUAL ENTRY)
Bilirubin, UA: NEGATIVE
Blood, UA: NEGATIVE
Glucose, UA: NEGATIVE mg/dL
Ketones, POC UA: NEGATIVE mg/dL
Nitrite, UA: NEGATIVE
Protein Ur, POC: NEGATIVE mg/dL
Spec Grav, UA: 1.025 (ref 1.010–1.025)
Urobilinogen, UA: 0.2 E.U./dL
pH, UA: 6 (ref 5.0–8.0)

## 2023-02-21 MED ORDER — CEPHALEXIN 500 MG PO CAPS
500.0000 mg | ORAL_CAPSULE | Freq: Two times a day (BID) | ORAL | 0 refills | Status: AC
  Filled 2023-02-21: qty 14, 7d supply, fill #0

## 2023-02-21 MED ORDER — PHENAZOPYRIDINE HCL 200 MG PO TABS
200.0000 mg | ORAL_TABLET | Freq: Three times a day (TID) | ORAL | 0 refills | Status: DC | PRN
  Filled 2023-02-21: qty 6, 2d supply, fill #0

## 2023-02-21 NOTE — ED Provider Notes (Signed)
[facility]    CSN: [id number] Arrival date & time: 02/21/23  0935      History   Chief Complaint Chief Complaint  Patient presents with   Dysuria   Back Pain    HPI [name] is a 57 y.o. female presents for evaluation of dysuria.  Patient reports 4 days of urinary burning, urgency, frequency and low back pain.  Denies fevers but does endorse chills.  No nausea or vomiting or flank pain.  No vaginal discharge or STD concern.  Has not taken any OTC medications for symptoms.  No other concerns at this time.    Dysuria Back Pain Associated symptoms: dysuria     Past Medical History:  Diagnosis Date   Allergy    Anemia    Anxiety    Arthritis    all over   Asthma    COPD (chronic obstructive pulmonary disease)    Depression    Dysrhythmia    Irregular heat w/ asthma episodes and anxiety   GERD (gastroesophageal reflux disease)    Hyperlipidemia    Hypertension    Migraines    PONV (postoperative nausea and vomiting)    Pre-diabetes    Sleep apnea    uses CPAP   Urinary incontinence    frequent urination- tx w/ vesicare   Urinary urgency     Patient Active Problem List   Diagnosis Date Noted   Adjustment disorder with anxiety 01/18/2023   Malodorous urine 12/22/2022   Pruritic intertrigo 04/18/2022   Hyperlipidemia due to type 2 diabetes mellitus 11/27/2021   Chronic mixed headache syndrome 06/05/2021   Back pain of lumbosacral region with sciatica 06/08/2020   Scar of scalp 11/13/2019   Restless leg syndrome 09/05/2017   Chronic migraine without aura with status migrainosus, not intractable 07/26/2017   Type 2 diabetes mellitus with hyperglycemia 10/03/2015   HTN (hypertension) 10/03/2015   Fatigue 07/21/2015   Obstructive sleep apnea 09/08/2010   Hypercholesterolemia 09/20/2008   HOT FLASHES 09/20/2008   Depression with anxiety 12/26/2006   Asthma 12/26/2006    Past Surgical History:  Procedure Laterality Date   ABDOMINAL  HYSTERECTOMY Bilateral 08/15/2017   Procedure: SUPRACERVICAL HYSTERECTOMY ABDOMINAL W/ BILATERAL SALPINGECTOMY AND LEFT OOPHORECTOMY;  Surgeon: Tereso Newcomer, MD;  Location: [facility];  Service: Gynecology;  Laterality: Bilateral;   ABLATION     CESAREAN SECTION     x3   DILATION AND CURETTAGE OF UTERUS  05/31/2005   MAB, TAB x 2   HERNIA REPAIR  1990's   HYSTEROSCOPY W/ ENDOMETRIAL ABLATION     LEEP     MAB     x 4   MOUTH SURGERY     teeth extraction   TUBAL LIGATION  11/01/2006   WISDOM TOOTH EXTRACTION      OB History     Gravida  15   Para  8   Term  8   Preterm  0   AB  7   Living  4      SAB  2   IAB  1   Ectopic      Multiple      Live Births               Home Medications    Prior to Admission medications   Medication Sig Start Date End Date Taking? Authorizing Provider  cephALEXin (KEFLEX) 500 MG capsule Take 1 capsule (500 mg total) by mouth 2 (two) times daily for 7  days. 02/21/23 02/28/23 Yes Radford Pax, NP  gabapentin (NEURONTIN) 300 MG capsule Take 1 capsule (300 mg total) by mouth every 8 (eight) hours as needed for pain 02/07/23   Lincoln Brigham, MD  albuterol (VENTOLIN HFA) 108 (90 Base) MCG/ACT inhaler Inhale 2 puffs into the lungs every 6 (six) hours as needed for wheezing or shortness of breath. 08/17/22   Salena Saner, MD  azelastine (ASTELIN) 0.1 % nasal spray Place 1 spray into both nostrils 2 (two) times daily. Use in each nostril as directed 01/15/22   Simmons-Robinson, Tawanna Cooler, MD  benzonatate (TESSALON) 100 MG capsule Take 1 capsule (100 mg total) by mouth 2 (two) times daily as needed for cough. 08/07/22   Lincoln Brigham, MD  BIOTIN PO Take 2,000 mg by mouth daily.    [provider]  blood glucose meter kit and supplies KIT Use as directed. 11/10/21   Westley Chandler, MD  busPIRone (BUSPAR) 5 MG tablet Take 1.5 tablets (7.5 mg total) by mouth 2 (two) times daily. 01/17/23   Lincoln Brigham, MD  cetirizine (ZYRTEC) 10 MG  tablet Take 1 tablet (10 mg total) by mouth daily. 02/07/21   Simmons-Robinson, Makiera, MD  Dupilumab (DUPIXENT) 300 MG/2ML SOPN Inject 300 mg into the skin every 14 (fourteen) days. Loading dose of  received on 11/07/21 12/05/22   Salena Saner, MD  Elastic Bandages & Supports (SHOULDER BRACE LARGE) MISC 1 each by Does not apply route as needed. 03/24/20   Myrene Buddy, MD  esomeprazole (NEXIUM) 40 MG capsule Take 1 capsule (40 mg total) by mouth daily. 08/07/22   Lincoln Brigham, MD  estradiol (ESTRACE VAGINAL) 0.1 MG/GM vaginal cream Place 1 g vaginally daily for 2 weeks, then use twice weekly thereafter. 02/26/22   Milas Hock, MD  fluorometholone (FML) 0.1 % ophthalmic suspension Place 1 drop into both eyes in the morning and at bedtime. 12/03/20   [provider]  fluticasone (FLONASE) 50 MCG/ACT nasal spray Place 2 sprays into both nostrils daily. 08/07/22   Lincoln Brigham, MD  Fluticasone-Umeclidin-Vilant (TRELEGY ELLIPTA) 200-62.5-25 MCG/ACT AEPB Inhale 1 puff into the lungs daily. 12/17/22   Salena Saner, MD  Galcanezumab-gnlm [facility]) 120 MG/ML SOAJ Inject 120 mg into the skin every 30 (thirty) days. 01/22/23   Lomax, Amy, NP  glucose blood test strip Use as directed 2 times daily. 12/20/22   Lincoln Brigham, MD  Lancets (FREESTYLE) lancets Use as directed 2 times daily. 12/20/22   Lincoln Brigham, MD  levalbuterol Pauline Aus) 1.25 MG/3ML nebulizer solution Take 1.25 mg by nebulization every 8 (eight) hours as needed for wheezing or shortness of breath. 01/17/22   Salena Saner, MD  losartan-hydrochlorothiazide (HYZAAR) 100-25 MG tablet Take 1 tablet by mouth daily. 08/07/22   Lincoln Brigham, MD  Multiple Vitamin (MULTIVITAMIN WITH MINERALS) TABS tablet Take 1 tablet by mouth daily.    [provider]  naproxen (NAPROSYN) 500 MG tablet Take 1 tablet (500 mg total) by mouth 2 (two) times daily as needed for mild or moderate pain 02/07/23   Lincoln Brigham, MD  nystatin  (MYCOSTATIN/NYSTOP) powder Apply 1 application topically 3 (three) times daily. 02/05/23   Lincoln Brigham, MD  oxybutynin (DITROPAN XL) 15 MG 24 hr tablet Take 1 tablet (15 mg total) by mouth at bedtime. 12/20/22   Lincoln Brigham, MD  Polyethyl Glycol-Propyl Glycol (SYSTANE OP) Place 1 drop into both eyes in the morning and at bedtime.    [provider]  Pyridoxine HCl (  VITAMIN B-6 PO) Take 1 tablet by mouth daily. Unknown OTC strength    [provider]  Rimegepant Sulfate (NURTEC) 75 MG TBDP Take 75 mg by mouth daily as needed (take for abortive therapy of migraine, no more than 1 tablet in 24 hours or 10 per month). 03/01/22   Lomax, Amy, NP  rosuvastatin (CRESTOR) 10 MG tablet Take 1 tablet (10 mg total) by mouth daily. 08/07/22   Lincoln Brigham, MD  Semaglutide,0.25 or 0.5MG /DOS, 2 MG/3ML SOPN Inject 0.25 mg into the skin once a week for 4 weeks, then increase to 0.5 mg weekly for at least 4 weeks. 08/07/22   Lincoln Brigham, MD  sertraline (ZOLOFT) 50 MG tablet Take 1 tablet (50 mg total) by mouth daily. 12/20/22   Lincoln Brigham, MD  Spacer/Aero-Holding Chambers (AEROCHAMBER MV) inhaler Use as instructed 05/09/15   Stephanie Acre, MD  vitamin C (ASCORBIC ACID) 500 MG tablet Take 500 mg by mouth daily.     [provider]    Family History Family History  Problem Relation Age of Onset   Diabetes Maternal Grandmother    Colon cancer Maternal Grandfather    Anesthesia problems Neg Hx    Headache Neg Hx    Aneurysm Neg Hx    Esophageal cancer Neg Hx    Rectal cancer Neg Hx    Stomach cancer Neg Hx    Migraines Neg Hx     Social History Social History   Tobacco Use   Smoking status: Former    Packs/day: 0.25    Years: 15.00    Additional pack years: 0.00    Total pack years: 3.75    Types: Cigarettes    Quit date: 02/16/2003    Years since quitting: 20.0   Smokeless tobacco: Never  Vaping Use   Vaping Use: Never used  Substance Use Topics   Alcohol use: Yes     Alcohol/week: 3.0 standard drinks of alcohol    Types: 3 Glasses of wine per week   Drug use: No     Allergies   Amitriptyline, Fluticasone-salmeterol, Imitrex [sumatriptan], Penicillins, Ciprofloxacin, Macrobid [nitrofurantoin monohyd macro], and Sulfamethoxazole-trimethoprim   Review of Systems Review of Systems  Genitourinary:  Positive for dysuria.  Musculoskeletal:  Positive for back pain.     Physical Exam Triage Vital Signs ED Triage Vitals  Enc Vitals Group     BP 02/21/23 0950 (!) 151/87     Pulse Rate 02/21/23 0950 79     Resp 02/21/23 0950 18     Temp 02/21/23 0950 98.7 F (37.1 C)     Temp Source 02/21/23 0950 Oral     SpO2 02/21/23 0950 93 %     Weight --      Height --      Head Circumference --      Peak Flow --      Pain Score 02/21/23 0948 7     Pain Loc --      Pain Edu? --      Excl. in GC? --    No data found.  Updated Vital Signs BP (!) 151/87 (BP Location: Left Arm)   Pulse 79   Temp 98.7 F (37.1 C) (Oral)   Resp 18   LMP 05/21/2017 (Exact Date)   SpO2 93%   Visual Acuity Right Eye Distance:   Left Eye Distance:   Bilateral Distance:    Right Eye Near:   Left Eye Near:    Bilateral Near:  Physical Exam Vitals and nursing note reviewed.  Constitutional:      Appearance: Normal appearance.  HENT:     Head: Normocephalic and atraumatic.  Eyes:     Pupils: Pupils are equal, round, and reactive to light.  Cardiovascular:     Rate and Rhythm: Normal rate.  Pulmonary:     Effort: Pulmonary effort is normal.  Abdominal:     Tenderness: There is no right CVA tenderness or left CVA tenderness.  Musculoskeletal:     Lumbar back: Tenderness present. No swelling, deformity or bony tenderness.  Skin:    General: Skin is warm and dry.  Neurological:     General: No focal deficit present.     Mental Status: She is alert and oriented to person, place, and time.  Psychiatric:        Mood and Affect: Mood normal.        Behavior:  Behavior normal.      UC Treatments / Results  Labs (all labs ordered are listed, but only abnormal results are displayed) Labs Reviewed  POCT URINALYSIS DIP (MANUAL ENTRY) - Abnormal; Notable for the following components:      Result Value   Color, UA light yellow (*)    Leukocytes, UA Trace (*)    All other components within normal limits  URINE CULTURE   Recent Results (from the past 2160 hour(s))  Nitric oxide     Status: None   Collection Time: 12/04/22  9:26 AM  Result Value Ref Range   Nitric Oxide 17   POCT urinalysis dipstick     Status: None   Collection Time: 12/20/22  2:45 PM  Result Value Ref Range   Color, UA yellow yellow   Clarity, UA clear clear   Glucose, UA negative negative mg/dL   Bilirubin, UA negative negative   Ketones, POC UA negative negative mg/dL   Spec Grav, UA 1.610 9.604 - 1.025   Blood, UA negative negative   pH, UA 6.5 5.0 - 8.0   Protein Ur, POC negative negative mg/dL   Urobilinogen, UA 0.2 0.2 or 1.0 E.U./dL   Nitrite, UA Negative Negative   Leukocytes, UA Negative Negative  HgB A1c     Status: Abnormal   Collection Time: 12/20/22  2:50 PM  Result Value Ref Range   Hemoglobin A1C     HbA1c POC (<> result, manual entry)     HbA1c, POC (prediabetic range)     HbA1c, POC (controlled diabetic range) 7.6 (A) 0.0 - 7.0 %  POCT urinalysis dipstick     Status: Abnormal   Collection Time: 02/21/23  9:58 AM  Result Value Ref Range   Color, UA light yellow (A) yellow   Clarity, UA clear clear   Glucose, UA negative negative mg/dL   Bilirubin, UA negative negative   Ketones, POC UA negative negative mg/dL   Spec Grav, UA 5.409 8.119 - 1.025   Blood, UA negative negative   pH, UA 6.0 5.0 - 8.0   Protein Ur, POC negative negative mg/dL   Urobilinogen, UA 0.2 0.2 or 1.0 E.U./dL   Nitrite, UA Negative Negative   Leukocytes, UA Trace (A) Negative    EKG   Radiology No results found.  Procedures Procedures (including critical care  time)  Medications Ordered in UC Medications - No data to display  Initial Impression / Assessment and Plan / UC Course  I have reviewed the triage vital signs and the nursing notes.  Pertinent labs &  imaging results that were available during my care of the patient were reviewed by me and considered in my medical decision making (see chart for details).     Reviewed exam and symptoms with patient.  Reviewed most recent urine culture from July 2023 as well as UA from 11/2022. UA positive for UTI, will culture.  Start Keflex 500 mg twice daily for 7 days Rest and fluids PCP follow-up if symptoms do not improve ER precautions reviewed and patient verbalized understanding Final Clinical Impressions(s) / UC Diagnoses   Final diagnoses:  Acute cystitis without hematuria     Discharge Instructions      Keflex twice a day for 7 days The clinic will send her urine for culture and contact you if those results are positive Rest and fluids Please follow-up with your PCP if your symptoms do not improve Please go to the ER for any worsening symptoms   ED Prescriptions     Medication Sig Dispense Auth. Provider   cephALEXin (KEFLEX) 500 MG capsule Take 1 capsule (500 mg total) by mouth 2 (two) times daily for 7 days. 14 capsule Radford Pax, NP      PDMP not reviewed this encounter.   Radford Pax, NP 02/21/23 1015

## 2023-02-21 NOTE — Telephone Encounter (Signed)
Does the PT need a loading dose (Starter Kit-2 pens loading dose) PA and then a maintenance PA?

## 2023-02-21 NOTE — ED Triage Notes (Signed)
Pt c/o dysuria, lower back pain (burning), urinary frequency, and odor since Monday.

## 2023-02-21 NOTE — Discharge Instructions (Signed)
Keflex twice a day for 7 days The clinic will send her urine for culture and contact you if those results are positive Rest and fluids Please follow-up with your PCP if your symptoms do not improve Please go to the ER for any worsening symptoms

## 2023-02-22 ENCOUNTER — Telehealth: Payer: Self-pay

## 2023-02-22 ENCOUNTER — Other Ambulatory Visit (HOSPITAL_COMMUNITY): Payer: Self-pay

## 2023-02-22 DIAGNOSIS — E1165 Type 2 diabetes mellitus with hyperglycemia: Secondary | ICD-10-CM

## 2023-02-22 DIAGNOSIS — J45901 Unspecified asthma with (acute) exacerbation: Secondary | ICD-10-CM | POA: Diagnosis not present

## 2023-02-22 DIAGNOSIS — G4733 Obstructive sleep apnea (adult) (pediatric): Secondary | ICD-10-CM | POA: Diagnosis not present

## 2023-02-22 LAB — URINE CULTURE

## 2023-02-22 NOTE — Telephone Encounter (Signed)
A Prior Authorization was initiated for this patients OZEMPIC through CoverMyMeds.   Key: [id number]

## 2023-02-23 LAB — URINE CULTURE: Culture: >=100000 — AB

## 2023-02-25 ENCOUNTER — Other Ambulatory Visit (HOSPITAL_COMMUNITY): Payer: Self-pay

## 2023-02-25 MED ORDER — TRULICITY 0.75 MG/0.5ML ~~LOC~~ SOAJ
0.7500 mg | SUBCUTANEOUS | 1 refills | Status: DC
Start: 2023-02-25 — End: 2023-06-03
  Filled 2023-02-25 – 2023-03-14 (×2): qty 2, 28d supply, fill #0
  Filled 2023-05-15: qty 2, 28d supply, fill #1

## 2023-02-25 NOTE — Addendum Note (Signed)
Addended by: Lincoln Brigham on: 02/25/2023 02:30 PM   Modules accepted: Orders

## 2023-02-25 NOTE — Telephone Encounter (Signed)
Prior Auth for patients medication OZEMPIC denied by [facility] [employer] via CoverMyMeds.   Reason:   Pt will need to have tried & failed formulary alternatives Trulicity and Victoza.  CoverMyMeds Key: [id number]

## 2023-02-26 ENCOUNTER — Other Ambulatory Visit (HOSPITAL_COMMUNITY): Payer: Self-pay

## 2023-02-27 ENCOUNTER — Telehealth: Payer: Self-pay

## 2023-02-27 ENCOUNTER — Other Ambulatory Visit (HOSPITAL_COMMUNITY): Payer: Self-pay

## 2023-02-27 NOTE — Telephone Encounter (Signed)
PA request submitted. New Encounter created for follow up. For additional info see Prior Auth telephone encounter from 02/27/2023. 

## 2023-02-27 NOTE — Telephone Encounter (Signed)
Pharmacy Patient Advocate Encounter   Received notification from [employer] that prior authorization for Emgality 120MG /ML auto-injectors (migraine) is required/requested.   PA submitted on 02/27/2023 to (ins) [employer] via [employer] or (Medicaid) confirmation # [id number] Status is pending

## 2023-02-28 ENCOUNTER — Other Ambulatory Visit (HOSPITAL_COMMUNITY): Payer: Self-pay

## 2023-02-28 DIAGNOSIS — F418 Other specified anxiety disorders: Secondary | ICD-10-CM | POA: Diagnosis not present

## 2023-02-28 DIAGNOSIS — F3289 Other specified depressive episodes: Secondary | ICD-10-CM | POA: Diagnosis not present

## 2023-02-28 NOTE — Telephone Encounter (Signed)
Patient Advocate Encounter  Prior Authorization for [facility] 120MG  has been approved with [employer].    PA# [id number] Effective dates: 5.1.24 through 8.1.24  Per [facility] test claim, copay for 30 days supply is $0 w/LILLY eVOUCHER

## 2023-03-04 NOTE — Progress Notes (Addendum)
PATIENT: [name] DOB: [birth year]  REASON FOR VISIT: follow up HISTORY FROM: patient  Chief Complaint  Patient presents with   Room 1    Pt is here Alone. Pt states that she has been under a lot of stress since November and has been having more migraines. Pt states that the Nurtec was helping and she is needing a new script for it.     HISTORY OF PRESENT ILLNESS:  03/06/23 ALL: [name] returns for follow up for migraines. She was last seen 02/2022 and doing well on Ajovy and Nurtec. Ajovy was switched to [employer] last week due to insurance coverage. She did have a period of significant worsening from 08/2022-11/2021. She lost her job and was under investigation for reportedly kicking a patient. After state investigation, she was cleared of any wrongdoing. She is doing much better. She has been offered a new position with [employer] and plans to start soon. She has not picked up Emgality. Nurtec continues to be helpful. She reports migraines are well managed at this time. She is seeing a Veterinary surgeon. Follows regularly with PCP. She is using CPAP regularly.   03/01/2022 ALL: [name] returns for follow up for migraines. She was last seen by Dr Lucia Gaskins 01/2021 and doing very well. She continues Ajovy. She feels that injections do help. She reports headaches have waxed and waned since. She is having more trouble with her blood pressure being elevated. PCP has changed her medications. She is under more stress recently. She reports her grand children's mother passed away, recently. She is having 8-12 headache days a month. Maybe 2-3 severe headaches are severe. Rest and warm compresses help. She feels that allergies contribute. She is taking Zyrtec daily. She is using CPAP every night. Apnea is reportedly well managed. Followed by pulmonology. She works from 3p-3a.   02/01/2021 AA:   She was married in November 2021. She has 5 sons. We looked at wedding photos, she had covid in January, she is still recovering, she  follows with pulmonology, she is using her cpap. She is compliant with her cpap. She hasn't had a headache since her allergies. She is doing great.  05/21/2019 ALL:  [name] is a 57 y.o. female here today for follow up of migraines.  Her last visit was about a month ago when she reported having some difficulty at the pharmacy with her medications.  Her dose of Ajovy was given the last week in May.  She has not had a dose of Ajovy since.  She reports trying Relpax that did offer minimal relief.  She continues to have consistent migraines.  She reports at least 4-5 migraines per week.  She was seen by her pulmonologist about a month ago who reported poor compliance with CPAP therapy.  She states that she is usually consistent in use but over the past 6 weeks or so she has not used her CPAP.  She states that frequent sinus infections and headaches prevent her from using CPAP.  She is treated with Zyrtec daily for seasonal allergies.  She has a tension type headache nearly every day.  She reports migraines have recently been behind her right eye.  She gets very fatigued, nauseated, dizzy and occasionally has some numbness in her fingertips with migraine.  She reports tension in her neck and the base of her skull with headaches.  She does occasionally have blurred vision.  She has recently had an eye exam that was reportedly normal.  She  does wear glasses.  HISTORY: (copied from my note on 04/01/2019)  [name] is a 57 y.o. female here today for follow up of migraines. She continues Ajovy for prevention and Imitrex for acute management. She is also prescribed gabapentin for pain and hot flashes as well as naproxen for pain.  She reports that she was late getting this month's dose of Ajovy due to pharmacy delay.  She has noted an increased frequency of her headaches this past week.  She has a migraine today that is been present for the past 3 days.  She has not taking any abortive therapy due to not having  any.  She reports that this is her typical migraine.  She denies any new finding on.   History (copied from Dr Trevor Mace note on 03/19/2018)   Interval history 03/19/2018: The Ajovy has helped with the headaches she likes it. She has had a great response. 2 migraines in April lasted 45 minutes. Most of the month she had no headaches she had some but not severe. Feeling great. Will continue Ajovy. Discussed botox and other treatments.   Interval History 09/10/2017: At last appointment ordered MRIs of the brain and orbits, they were not completed. Also ordered physical therapy but finished only 2/13 visits. Had surgery 08/15/2017. hysterectomy, Bilateral salpingectomy, left oopherectomy. She can't afford MRI or PT. Started Aimovig, but she could not get it. Will try Ajovy, will give a copay ard.    HPI:  [name] is a 57 y.o. female here as a referral from Dr. Darin Engels for migraines. Past medical history of migraines, high blood pressure, anxiety, depression, asthma.  Headaches wake her up in the morning. Migraines since the age of 60. Worse this year, she was taking care of her grandmother and passed away and there has been stress and a bad relationship. She has had migraine for days after getting a flu shot. She was a patient of the [facility] center. They start at the back of there head and spread all ove rthe head, she has tenderness of the skin, She has a prodrome feels the pain in the neck. For the last year. She has light and sound sensitivity, it is pulsating and throbbing and pounding with nausea and vomiting and also has vision changes and episodic vision loss unknown eye. Migraines can last weeks, has had one for weeks now, she was in the ED last Saturday, she is missing work. Sleeping in a dark room helps. Stress worsens it. Woke up with a headache again this morning and she has neck pain and neck tightness. Reports daily headaches and at least 15 are migrainous for the last 9 months. She has  allergies as well. No medication overuse. No aura. No other focal neurologic deficits, associated symptoms, inciting events or modifiable factors. Lots of stress.    Medications tried include: Baclofen, Imitrex, Lexapro, gabapentin, losartan, naproxen, Zofran, propranolol, tramadol. She tried amitriptyline and has an allergy to it, zomig   Reviewed notes, labs and imaging from outside physicians, which showed:   presented the emergency room for evaluation of intermittent, generalized headache for the past 4 days on 07/20/2017. Patient had associated dizziness and feeling off balance with nausea. The headache began in the back of the head and radiated to the front. Similar to previous migraines. She tried ibuprofen. The past she received several Botox injections with provided her with 1-2 years relief in her migraines. Denied any numbness, fevers, chills, headache injury, falls, blood thinner use.  She was given Compazine, Benadryl and IV fluids in the emergency room. Symptoms reviewed and she was discharged.   REVIEW OF SYSTEMS: Out of a complete 14 system review of symptoms, the patient complains only of the following symptoms, headaches, anxiety and all other reviewed systems are negative.  ALLERGIES: Allergies  Allergen Reactions   Amitriptyline     Abnormal behavior. Just doesn't feel like normal self    Fluticasone-Salmeterol Other (See Comments)    Thrush even with mouth rinsing; worsens cough   Imitrex [Sumatriptan]     Made her head feel like its on fire   Penicillins Itching and Swelling    Has patient had a PCN reaction causing immediate rash, facial/tongue/throat swelling, SOB or lightheadedness with hypotension: No Has patient had a PCN reaction causing severe rash involving mucus membranes or skin necrosis: No Has patient had a PCN reaction that required hospitalization: No Has patient had a PCN reaction occurring within the last 10 years: Yes If all of the above answers are "NO",  then may proceed with Cephalosporin use.    Ciprofloxacin Itching and Rash   Macrobid [Nitrofurantoin Monohyd Macro] Itching   Sulfamethoxazole-Trimethoprim Rash    HOME MEDICATIONS: Outpatient Medications Prior to Visit  Medication Sig Dispense Refill   albuterol (VENTOLIN HFA) 108 (90 Base) MCG/ACT inhaler Inhale 2 puffs into the lungs every 6 (six) hours as needed for wheezing or shortness of breath. 6.7 g 6   azelastine (ASTELIN) 0.1 % nasal spray Place 1 spray into both nostrils 2 (two) times daily. Use in each nostril as directed 30 mL 12   benzonatate (TESSALON) 100 MG capsule Take 1 capsule (100 mg total) by mouth 2 (two) times daily as needed for cough. 20 capsule 0   BIOTIN PO Take 2,000 mg by mouth daily.     blood glucose meter kit and supplies KIT Use as directed. 1 each 0   busPIRone (BUSPAR) 5 MG tablet Take 1.5 tablets (7.5 mg total) by mouth 2 (two) times daily. 90 tablet 1   cetirizine (ZYRTEC) 10 MG tablet Take 1 tablet (10 mg total) by mouth daily. 30 tablet 11   Dulaglutide (TRULICITY) 0.75 MG/0.5ML SOPN Inject 0.75 mg into the skin once a week. 2 mL 1   Dupilumab (DUPIXENT) 300 MG/2ML SOPN Inject 300 mg into the skin every 14 (fourteen) days. Loading dose of 600mg  received on 11/07/21 12 mL 1   esomeprazole (NEXIUM) 40 MG capsule Take 1 capsule (40 mg total) by mouth daily. 90 capsule 1   estradiol (ESTRACE VAGINAL) 0.1 MG/GM vaginal cream Place 1 g vaginally daily for 2 weeks, then use twice weekly thereafter. 42.5 g 2   fluorometholone (FML) 0.1 % ophthalmic suspension Place 1 drop into both eyes in the morning and at bedtime.     fluticasone (FLONASE) 50 MCG/ACT nasal spray Place 2 sprays into both nostrils daily. 16 g 6   Fluticasone-Umeclidin-Vilant (TRELEGY ELLIPTA) 200-62.5-25 MCG/ACT AEPB Inhale 1 puff into the lungs daily. 60 each 11   gabapentin (NEURONTIN) 300 MG capsule Take 1 capsule (300 mg total) by mouth every 8 (eight) hours as needed for pain 90 capsule  2   Galcanezumab-gnlm (EMGALITY) 120 MG/ML SOAJ Inject 120 mg into the skin every 30 (thirty) days. 3 mL 3   glucose blood test strip Use as directed 2 times daily. 100 each 0   Lancets (FREESTYLE) lancets Use as directed 2 times daily. 100 each 0   levalbuterol (XOPENEX) 1.25 MG/3ML nebulizer solution  Take 1.25 mg by nebulization every 8 (eight) hours as needed for wheezing or shortness of breath. 90 mL 2   losartan-hydrochlorothiazide (HYZAAR) 100-25 MG tablet Take 1 tablet by mouth daily. 90 tablet 1   Multiple Vitamin (MULTIVITAMIN WITH MINERALS) TABS tablet Take 1 tablet by mouth daily.     naproxen (NAPROSYN) 500 MG tablet Take 1 tablet (500 mg total) by mouth 2 (two) times daily as needed for mild or moderate pain 60 tablet 0   nystatin (MYCOSTATIN/NYSTOP) powder Apply 1 application topically 3 (three) times daily. 60 g 0   oxybutynin (DITROPAN XL) 15 MG 24 hr tablet Take 1 tablet (15 mg total) by mouth at bedtime. 90 tablet 1   phenazopyridine (PYRIDIUM) 200 MG tablet Take 1 tablet (200 mg total) by mouth 3 (three) times daily as needed for pain. 6 tablet 0   Polyethyl Glycol-Propyl Glycol (SYSTANE OP) Place 1 drop into both eyes in the morning and at bedtime.     Pyridoxine HCl (VITAMIN B-6 PO) Take 1 tablet by mouth daily. Unknown OTC strength     rosuvastatin (CRESTOR) 10 MG tablet Take 1 tablet (10 mg total) by mouth daily. 90 tablet 1   sertraline (ZOLOFT) 50 MG tablet Take 1 tablet (50 mg total) by mouth daily. 90 tablet 3   Spacer/Aero-Holding Chambers (AEROCHAMBER MV) inhaler Use as instructed 1 each 0   vitamin C (ASCORBIC ACID) 500 MG tablet Take 500 mg by mouth daily.      Rimegepant Sulfate (NURTEC) 75 MG TBDP Take 75 mg by mouth daily as needed (take for abortive therapy of migraine, no more than 1 tablet in 24 hours or 10 per month). 8 tablet 11   Elastic Bandages & Supports (SHOULDER BRACE LARGE) MISC 1 each by Does not apply route as needed. (Patient not taking: Reported on  03/06/2023) 1 each 0   No facility-administered medications prior to visit.    PAST MEDICAL HISTORY: Past Medical History:  Diagnosis Date   Allergy    Anemia    Anxiety    Arthritis    all over   Asthma    COPD (chronic obstructive pulmonary disease) (HCC)    Depression    Dysrhythmia    Irregular heat w/ asthma episodes and anxiety   GERD (gastroesophageal reflux disease)    Hyperlipidemia    Hypertension    Migraines    PONV (postoperative nausea and vomiting)    Pre-diabetes    Sleep apnea    uses CPAP   Urinary incontinence    frequent urination- tx w/ vesicare   Urinary urgency     PAST SURGICAL HISTORY: Past Surgical History:  Procedure Laterality Date   ABDOMINAL HYSTERECTOMY Bilateral 08/15/2017   Procedure: SUPRACERVICAL HYSTERECTOMY ABDOMINAL W/ BILATERAL SALPINGECTOMY AND LEFT OOPHORECTOMY;  Surgeon: Tereso Newcomer, MD;  Location: [facility];  Service: Gynecology;  Laterality: Bilateral;   ABLATION     CESAREAN SECTION     x3   DILATION AND CURETTAGE OF UTERUS  05/31/2005   MAB, TAB x 2   HERNIA REPAIR  1990's   HYSTEROSCOPY W/ ENDOMETRIAL ABLATION     LEEP     MAB     x 4   MOUTH SURGERY     teeth extraction   TUBAL LIGATION  11/01/2006   WISDOM TOOTH EXTRACTION      FAMILY HISTORY: Family History  Problem Relation Age of Onset   Diabetes Maternal Grandmother    Colon cancer Maternal Grandfather  Anesthesia problems Neg Hx    Headache Neg Hx    Aneurysm Neg Hx    Esophageal cancer Neg Hx    Rectal cancer Neg Hx    Stomach cancer Neg Hx    Migraines Neg Hx     SOCIAL HISTORY: Social History   Socioeconomic History   Marital status: Married    Spouse name: Not on file   Number of children: 5   Years of education: Not on file   Highest education level: High school graduate  Occupational History   Not on file  Tobacco Use   Smoking status: Former    Packs/day: 0.25    Years: 15.00    Additional pack years: 0.00    Total pack years:  3.75    Types: Cigarettes    Quit date: 02/16/2003    Years since quitting: 20.0   Smokeless tobacco: Never  Vaping Use   Vaping Use: Never used  Substance and Sexual Activity   Alcohol use: Yes    Alcohol/week: 3.0 standard drinks of alcohol    Types: 3 Glasses of wine per week   Drug use: No   Sexual activity: Yes    Partners: Male    Birth control/protection: Surgical  Other Topics Concern   Not on file  Social History Narrative   Lives at home with husband    Right handed   Caffeine: none         ** Merged History Encounter **       Social Determinants of Health   Financial Resource Strain: Not on file  Food Insecurity: Not on file  Transportation Needs: Not on file  Physical Activity: Not on file  Stress: Not on file  Social Connections: Not on file  Intimate Partner Violence: Not on file      PHYSICAL EXAM  Vitals:   03/06/23 0805  BP: (!) 148/92  Pulse: (!) 57  Weight: 205 lb 8 oz (93.2 kg)  Height: 5\' 1"  (1.549 m)     Body mass index is 38.83 kg/m.  Generalized: Well developed, in no acute distress  Cardiology: normal rate and rhythm, no murmur noted Neurological examination  Mentation: Alert oriented to time, place, history taking. Follows all commands speech and language fluent Cranial nerve II-XII: Pupils were equal round reactive to light. Extraocular movements were full, visual field were full on confrontational test. Facial sensation and strength were normal. Uvula tongue midline. Head turning and shoulder shrug  were normal and symmetric. Motor: The motor testing reveals 5 over 5 strength of all 4 extremities. Good symmetric motor tone is noted throughout.  Coordination: Cerebellar testing reveals good finger-nose-finger and heel-to-shin bilaterally.  Gait and station: Gait is normal.   DIAGNOSTIC DATA (LABS, IMAGING, TESTING) - I reviewed patient records, labs, notes, testing and imaging myself where available.      No data to display            Lab Results  Component Value Date   WBC 10.6 01/01/2022   HGB 10.9 (L) 01/01/2022   HCT 33.1 (L) 01/01/2022   MCV 91 01/01/2022   PLT 352 01/01/2022      Component Value Date/Time   NA 140 04/04/2022 1432   K 3.9 04/04/2022 1432   CL 101 04/04/2022 1432   CO2 25 04/04/2022 1432   GLUCOSE 138 (H) 04/04/2022 1432   GLUCOSE 108 (H) 05/12/2018 1500   BUN 14 04/04/2022 1432   CREATININE 0.87 04/04/2022 1432   CREATININE  0.82 09/05/2016 1653   CALCIUM 9.8 04/04/2022 1432   PROT 7.6 01/01/2022 1558   ALBUMIN 4.3 01/01/2022 1558   AST 15 01/01/2022 1558   ALT 20 01/01/2022 1558   ALKPHOS 77 01/01/2022 1558   BILITOT 0.3 01/01/2022 1558   GFRNONAA 67 05/07/2019 1211   GFRNONAA 84 09/05/2016 1653   GFRAA 78 05/07/2019 1211   GFRAA >89 09/05/2016 1653   Lab Results  Component Value Date   CHOL 203 (H) 04/04/2022   HDL 50 04/04/2022   LDLCALC 123 (H) 04/04/2022   LDLDIRECT 111 (H) 05/09/2022   TRIG 171 (H) 04/04/2022   CHOLHDL 4.1 04/04/2022   Lab Results  Component Value Date   HGBA1C 7.6 (A) 12/20/2022   Lab Results  Component Value Date   VITAMINB12 386 09/05/2017   Lab Results  Component Value Date   TSH 2.210 11/27/2021    ASSESSMENT AND PLAN 57 y.o. year old female  has a past medical history of Allergy, Anemia, Anxiety, Arthritis, Asthma, COPD (chronic obstructive pulmonary disease) (HCC), Depression, Dysrhythmia, GERD (gastroesophageal reflux disease), Hyperlipidemia, Hypertension, Migraines, PONV (postoperative nausea and vomiting), Pre-diabetes, Sleep apnea, Urinary incontinence, and Urinary urgency. here with     ICD-10-CM   1. Chronic migraine without aura with status migrainosus, not intractable  G43.701       Kashena reports increased frequency in headaches for a few months but recently noted improvement. Insurance requiring witch to [employer]. She will start injections asap. Continue Nurtec for abortive therapy. I have encouraged her to use  her CPAP nightly for greater than 4 hours each night. She was advised to follow-up in 1 year, sooner if needed.  She verbalizes understanding and agreement with this plan.  Addendum 12/18/2023: unable to be on two CGRPs. Will continue Zomig for abortive therapy. Discontinue Bernita Raisin. Continue Emgality based on over 50% improvement in migraines. 15 headache days prior to starting and now having 3 on average, per month.    No orders of the defined types were placed in this encounter.    Meds ordered this encounter  Medications   Rimegepant Sulfate (NURTEC) 75 MG TBDP    Sig: Take 1 tablet (75 mg total) by mouth daily as needed (take for abortive therapy of migraine, no more than 1 tablet in 24 hours or 10 per month).    Dispense:  8 tablet    Refill:  11    Order Specific Question:   Supervising Provider    Answer:   Anson Fret [id number], FNP-C 03/06/2023, 11:51 AM [facility] [address] [place], [place] [address] [phone]

## 2023-03-04 NOTE — Telephone Encounter (Signed)
Please see additional created encounters for updates

## 2023-03-04 NOTE — Patient Instructions (Signed)
Below is our plan:  We will start Emgality and continue Nurtec. Meds should be ready at your pharmacy.   Please make sure you are staying well hydrated. I recommend 50-60 ounces daily. Well balanced diet and regular exercise encouraged. Consistent sleep schedule with 6-8 hours recommended.   Please continue follow up with care team as directed.   Follow up with me in 1 year, sooner if needed   You may receive a survey regarding today's visit. I encourage you to leave honest feed back as I do use this information to improve patient care. Thank you for seeing me today!

## 2023-03-05 DIAGNOSIS — F3289 Other specified depressive episodes: Secondary | ICD-10-CM | POA: Diagnosis not present

## 2023-03-05 DIAGNOSIS — F418 Other specified anxiety disorders: Secondary | ICD-10-CM | POA: Diagnosis not present

## 2023-03-06 ENCOUNTER — Encounter: Payer: Self-pay | Admitting: Family Medicine

## 2023-03-06 ENCOUNTER — Other Ambulatory Visit (HOSPITAL_COMMUNITY): Payer: Self-pay

## 2023-03-06 ENCOUNTER — Ambulatory Visit: Payer: 59 | Admitting: Family Medicine

## 2023-03-06 VITALS — BP 148/92 | HR 57 | Ht 61.0 in | Wt 205.5 lb

## 2023-03-06 DIAGNOSIS — G43701 Chronic migraine without aura, not intractable, with status migrainosus: Secondary | ICD-10-CM | POA: Diagnosis not present

## 2023-03-06 MED ORDER — NURTEC 75 MG PO TBDP
75.0000 mg | ORAL_TABLET | Freq: Every day | ORAL | 11 refills | Status: DC | PRN
  Filled 2023-03-06: qty 8, 8d supply, fill #0

## 2023-03-07 ENCOUNTER — Other Ambulatory Visit (HOSPITAL_COMMUNITY): Payer: Self-pay

## 2023-03-12 DIAGNOSIS — F3289 Other specified depressive episodes: Secondary | ICD-10-CM | POA: Diagnosis not present

## 2023-03-12 DIAGNOSIS — F418 Other specified anxiety disorders: Secondary | ICD-10-CM | POA: Diagnosis not present

## 2023-03-13 ENCOUNTER — Telehealth: Payer: Self-pay

## 2023-03-13 NOTE — Telephone Encounter (Signed)
A Prior Authorization was initiated for this patients TRULICITY through CoverMyMeds.   Key: [id number]

## 2023-03-14 ENCOUNTER — Telehealth: Payer: Self-pay

## 2023-03-14 ENCOUNTER — Other Ambulatory Visit (HOSPITAL_COMMUNITY): Payer: Self-pay

## 2023-03-14 DIAGNOSIS — G43701 Chronic migraine without aura, not intractable, with status migrainosus: Secondary | ICD-10-CM

## 2023-03-14 NOTE — Telephone Encounter (Signed)
Prior Auth for patients medication TRULICITY approved by [facility] [employer] from 03/13/23 to 03/12/24.  CoverMyMeds Key: [facility] PA Case ID #: [id number]

## 2023-03-14 NOTE — Telephone Encounter (Signed)
Pharmacy Patient Advocate Encounter   Received notification from [employer] that prior authorization for Nurtec 75MG  dispersible tablets is required/requested.   PA submitted on 03/14/2023 to (ins) [employer] via [employer] or (Medicaid) confirmation # [id number] Status is pending

## 2023-03-15 ENCOUNTER — Other Ambulatory Visit (HOSPITAL_COMMUNITY): Payer: Self-pay

## 2023-03-18 ENCOUNTER — Other Ambulatory Visit (HOSPITAL_COMMUNITY): Payer: Self-pay

## 2023-03-18 MED ORDER — UBRELVY 100 MG PO TABS
100.0000 mg | ORAL_TABLET | Freq: Two times a day (BID) | ORAL | 5 refills | Status: DC
  Filled 2023-03-18 – 2023-05-15 (×2): qty 10, 30d supply, fill #0
  Filled 2023-07-12: qty 10, 30d supply, fill #1
  Filled 2023-08-15: qty 10, 30d supply, fill #2
  Filled 2023-10-02: qty 10, 30d supply, fill #3
  Filled 2023-11-12: qty 10, 30d supply, fill #4

## 2023-03-18 NOTE — Telephone Encounter (Signed)
I do not see where pt has tried/failed Ubrelvy.  Pt takes Nurtec prn for migraines.  Emgality not appropriate option as this is not an abortive therapy option.

## 2023-03-18 NOTE — Telephone Encounter (Signed)
Pharmacy Patient Advocate Encounter  Received notification from [facility] that the request for prior authorization for Nurtec 75MG  dispersible tablets has been denied due to See below.     Please be advised we currently do not have a Pharmacist to review denials, therefore you will need to process appeals accordingly as needed. Thanks for your support at this time.   You may call [phone] or fax [phone]., to appeal.

## 2023-03-19 ENCOUNTER — Other Ambulatory Visit (HOSPITAL_COMMUNITY): Payer: Self-pay

## 2023-03-19 DIAGNOSIS — F3289 Other specified depressive episodes: Secondary | ICD-10-CM | POA: Diagnosis not present

## 2023-03-19 DIAGNOSIS — F418 Other specified anxiety disorders: Secondary | ICD-10-CM | POA: Diagnosis not present

## 2023-03-21 ENCOUNTER — Other Ambulatory Visit (HOSPITAL_COMMUNITY): Payer: Self-pay

## 2023-03-23 ENCOUNTER — Other Ambulatory Visit (HOSPITAL_COMMUNITY): Payer: Self-pay

## 2023-03-24 DIAGNOSIS — G4733 Obstructive sleep apnea (adult) (pediatric): Secondary | ICD-10-CM | POA: Diagnosis not present

## 2023-03-24 DIAGNOSIS — J45901 Unspecified asthma with (acute) exacerbation: Secondary | ICD-10-CM | POA: Diagnosis not present

## 2023-04-09 ENCOUNTER — Other Ambulatory Visit (HOSPITAL_COMMUNITY): Payer: Self-pay

## 2023-04-11 ENCOUNTER — Encounter (HOSPITAL_COMMUNITY): Payer: Self-pay

## 2023-04-11 ENCOUNTER — Other Ambulatory Visit (HOSPITAL_COMMUNITY): Payer: Self-pay

## 2023-04-11 ENCOUNTER — Other Ambulatory Visit: Payer: Self-pay | Admitting: Family Medicine

## 2023-04-11 ENCOUNTER — Telehealth: Payer: Self-pay

## 2023-04-11 DIAGNOSIS — E1169 Type 2 diabetes mellitus with other specified complication: Secondary | ICD-10-CM

## 2023-04-11 DIAGNOSIS — I10 Essential (primary) hypertension: Secondary | ICD-10-CM

## 2023-04-11 DIAGNOSIS — K219 Gastro-esophageal reflux disease without esophagitis: Secondary | ICD-10-CM

## 2023-04-11 MED ORDER — LOSARTAN POTASSIUM-HCTZ 100-25 MG PO TABS
1.0000 | ORAL_TABLET | Freq: Every day | ORAL | 1 refills | Status: DC
Start: 2023-04-11 — End: 2023-11-21
  Filled 2023-04-11: qty 30, 30d supply, fill #0
  Filled 2023-05-15: qty 30, 30d supply, fill #1
  Filled 2023-07-12: qty 30, 30d supply, fill #2
  Filled 2023-08-15: qty 30, 30d supply, fill #3
  Filled 2023-10-02: qty 30, 30d supply, fill #4
  Filled 2023-11-12: qty 30, 30d supply, fill #5

## 2023-04-11 MED ORDER — ROSUVASTATIN CALCIUM 10 MG PO TABS
10.0000 mg | ORAL_TABLET | Freq: Every day | ORAL | 1 refills | Status: DC
Start: 2023-04-11 — End: 2023-09-03
  Filled 2023-04-11: qty 30, 30d supply, fill #0
  Filled 2023-05-15: qty 30, 30d supply, fill #1
  Filled 2023-07-12: qty 30, 30d supply, fill #2
  Filled 2023-08-15: qty 30, 30d supply, fill #3

## 2023-04-11 MED ORDER — ESOMEPRAZOLE MAGNESIUM 40 MG PO CPDR
40.0000 mg | DELAYED_RELEASE_CAPSULE | Freq: Every day | ORAL | 1 refills | Status: DC
Start: 2023-04-11 — End: 2023-11-12
  Filled 2023-04-11: qty 90, 90d supply, fill #0
  Filled 2023-05-15 – 2023-08-15 (×2): qty 90, 90d supply, fill #1

## 2023-04-11 NOTE — Telephone Encounter (Signed)
Pharmacy Patient Advocate Encounter  Prior Authorization for Bernita Raisin 100MG  tablets has been APPROVED by [facility] from 04/11/2023 to 04/10/2024.   PA # PA Case ID: [id number]  Copay is $0 per [facility] test claim.

## 2023-04-11 NOTE — Telephone Encounter (Signed)
Pharmacy Patient Advocate Encounter   Received notification from [facility] that prior authorization for Ubrelvy 100MG  tablets is required/requested.   PA submitted to [facility] via CoverMyMeds Key or (Medicaid) confirmation # [id number] Status is pending

## 2023-04-12 ENCOUNTER — Other Ambulatory Visit (HOSPITAL_COMMUNITY): Payer: Self-pay

## 2023-04-24 ENCOUNTER — Telehealth: Payer: Self-pay | Admitting: Pulmonary Disease

## 2023-04-24 ENCOUNTER — Other Ambulatory Visit (HOSPITAL_COMMUNITY): Payer: Self-pay

## 2023-04-24 DIAGNOSIS — G4733 Obstructive sleep apnea (adult) (pediatric): Secondary | ICD-10-CM | POA: Diagnosis not present

## 2023-04-24 DIAGNOSIS — F3289 Other specified depressive episodes: Secondary | ICD-10-CM | POA: Diagnosis not present

## 2023-04-24 DIAGNOSIS — J45901 Unspecified asthma with (acute) exacerbation: Secondary | ICD-10-CM | POA: Diagnosis not present

## 2023-04-24 DIAGNOSIS — F418 Other specified anxiety disorders: Secondary | ICD-10-CM | POA: Diagnosis not present

## 2023-04-24 MED ORDER — METHYLPREDNISOLONE 4 MG PO TBPK
ORAL_TABLET | ORAL | 0 refills | Status: DC
  Filled 2023-04-24: qty 21, 6d supply, fill #0

## 2023-04-24 MED ORDER — AZITHROMYCIN 250 MG PO TABS
ORAL_TABLET | ORAL | 0 refills | Status: AC
  Filled 2023-04-24: qty 6, 5d supply, fill #0

## 2023-04-24 NOTE — Telephone Encounter (Signed)
Lets send in Medrol Dosepak and azithromycin Z-Pak.  She should follow-up in 3 to 4 weeks with either me or the APP.

## 2023-04-24 NOTE — Telephone Encounter (Signed)
ATC the patient. LVM for the patient to return my call. 

## 2023-04-24 NOTE — Telephone Encounter (Addendum)
Symptoms since Sunday when she came home from [place]. She said the plane was really cold.  No fevers, chills or sweats Cough with green sputum A little SOB Wheezing   Taking- Mucinex Liquid Astelin- Bid Albuterol inhaler- BID Tessalon- BID but it is not helping and makes her tired Levalbuterol- at bedtime  Trelegy- every day Ginger chews for cough  Patient's Home Covid test was negative.

## 2023-04-24 NOTE — Telephone Encounter (Signed)
Medications have been sent to her pharmacy and I scheduled her an appt for 7/26 at 10:30am with Rubye Oaks, NP. The patient is aware.  Nothing further needed.

## 2023-04-24 NOTE — Telephone Encounter (Signed)
PT has a bad cough. Taking Tesslon Pearls but still very bad. States she would like Dr. Reece Agar to call in something for her. Appt's too far out. Please call @ [phone] She is an asthma PT.   Pharm is [facility] [place].

## 2023-05-03 ENCOUNTER — Telehealth: Payer: Self-pay

## 2023-05-03 ENCOUNTER — Other Ambulatory Visit (HOSPITAL_COMMUNITY): Payer: Self-pay

## 2023-05-03 NOTE — Telephone Encounter (Signed)
Pharmacy Patient Advocate Encounter   Received notification from [facility] that prior authorization for Emgality 120MG /ML auto-injectors (migraine) is required/requested.   PA submitted to [facility] via CoverMyMeds Key or (Medicaid) confirmation # [id number] Status is pending

## 2023-05-07 DIAGNOSIS — F3289 Other specified depressive episodes: Secondary | ICD-10-CM | POA: Diagnosis not present

## 2023-05-07 DIAGNOSIS — F418 Other specified anxiety disorders: Secondary | ICD-10-CM | POA: Diagnosis not present

## 2023-05-07 NOTE — Telephone Encounter (Signed)
Pharmacy Patient Advocate Encounter  Prior Authorization for Emgality 120MG /ML auto-injectors (migraine) has been APPROVED by [facility] from 05/03/2023 to 08/03/2023.   PA # PA# [facility] - [place] [id number] BS

## 2023-05-09 ENCOUNTER — Other Ambulatory Visit (HOSPITAL_COMMUNITY): Payer: Self-pay

## 2023-05-09 DIAGNOSIS — Z111 Encounter for screening for respiratory tuberculosis: Secondary | ICD-10-CM | POA: Diagnosis not present

## 2023-05-09 DIAGNOSIS — R7612 Nonspecific reaction to cell mediated immunity measurement of gamma interferon antigen response without active tuberculosis: Secondary | ICD-10-CM | POA: Diagnosis not present

## 2023-05-15 ENCOUNTER — Other Ambulatory Visit: Payer: Self-pay | Admitting: Obstetrics and Gynecology

## 2023-05-15 ENCOUNTER — Other Ambulatory Visit: Payer: Self-pay

## 2023-05-15 ENCOUNTER — Other Ambulatory Visit (HOSPITAL_COMMUNITY): Payer: Self-pay

## 2023-05-15 ENCOUNTER — Other Ambulatory Visit: Payer: Self-pay | Admitting: Pulmonary Disease

## 2023-05-15 ENCOUNTER — Other Ambulatory Visit: Payer: Self-pay | Admitting: Family Medicine

## 2023-05-15 DIAGNOSIS — E1165 Type 2 diabetes mellitus with hyperglycemia: Secondary | ICD-10-CM

## 2023-05-15 DIAGNOSIS — Z1231 Encounter for screening mammogram for malignant neoplasm of breast: Secondary | ICD-10-CM

## 2023-05-15 DIAGNOSIS — M5440 Lumbago with sciatica, unspecified side: Secondary | ICD-10-CM

## 2023-05-15 DIAGNOSIS — L304 Erythema intertrigo: Secondary | ICD-10-CM

## 2023-05-15 DIAGNOSIS — F3289 Other specified depressive episodes: Secondary | ICD-10-CM | POA: Diagnosis not present

## 2023-05-15 DIAGNOSIS — F418 Other specified anxiety disorders: Secondary | ICD-10-CM | POA: Diagnosis not present

## 2023-05-15 MED ORDER — FREESTYLE LITE TEST VI STRP
ORAL_STRIP | 0 refills | Status: DC
Start: 2023-05-15 — End: 2024-08-31
  Filled 2023-05-15: qty 50, 25d supply, fill #0
  Filled 2023-07-12: qty 50, 25d supply, fill #1

## 2023-05-15 MED ORDER — NAPROXEN 500 MG PO TABS
500.0000 mg | ORAL_TABLET | Freq: Two times a day (BID) | ORAL | 0 refills | Status: DC | PRN
Start: 2023-05-15 — End: 2023-11-28
  Filled 2023-05-15: qty 60, 30d supply, fill #0

## 2023-05-15 MED ORDER — FREESTYLE LANCETS MISC
0 refills | Status: DC
Start: 2023-05-15 — End: 2024-08-31
  Filled 2023-05-15: qty 100, 30d supply, fill #0

## 2023-05-15 MED ORDER — NYSTATIN 100000 UNIT/GM EX POWD
1.0000 | Freq: Three times a day (TID) | CUTANEOUS | 0 refills | Status: DC
Start: 2023-05-15 — End: 2023-08-15
  Filled 2023-05-15: qty 60, 7d supply, fill #0

## 2023-05-17 ENCOUNTER — Ambulatory Visit (INDEPENDENT_AMBULATORY_CARE_PROVIDER_SITE_OTHER): Payer: 59 | Admitting: Student

## 2023-05-17 ENCOUNTER — Other Ambulatory Visit (HOSPITAL_COMMUNITY): Payer: Self-pay

## 2023-05-17 ENCOUNTER — Other Ambulatory Visit (HOSPITAL_COMMUNITY)
Admission: RE | Admit: 2023-05-17 | Discharge: 2023-05-17 | Disposition: A | Discharge status: Home / Self Care | Payer: 59 | Source: Ambulatory Visit | Attending: Family Medicine | Admitting: Family Medicine

## 2023-05-17 VITALS — BP 123/86 | HR 68

## 2023-05-17 DIAGNOSIS — E1165 Type 2 diabetes mellitus with hyperglycemia: Secondary | ICD-10-CM

## 2023-05-17 DIAGNOSIS — Z113 Encounter for screening for infections with a predominantly sexual mode of transmission: Secondary | ICD-10-CM | POA: Diagnosis not present

## 2023-05-17 DIAGNOSIS — N898 Other specified noninflammatory disorders of vagina: Secondary | ICD-10-CM | POA: Diagnosis not present

## 2023-05-17 LAB — POCT GLYCOSYLATED HEMOGLOBIN (HGB A1C): HbA1c, POC (controlled diabetic range): 9.1 % — AB (ref 0.0–7.0)

## 2023-05-17 LAB — POCT WET PREP (WET MOUNT)
Clue Cells Wet Prep Whiff POC: NEGATIVE
Trichomonas Wet Prep HPF POC: ABSENT

## 2023-05-17 MED ORDER — FLUCONAZOLE 150 MG PO TABS
ORAL_TABLET | ORAL | 0 refills | Status: DC
Start: 2023-05-17 — End: 2023-08-30
  Filled 2023-05-17: qty 2, 4d supply, fill #0

## 2023-05-17 NOTE — Progress Notes (Signed)
    SUBJECTIVE:   CHIEF COMPLAINT / HPI:   Vaginal Discharge Discharge, for several days.  It is clumpy. + itchy.  She is suspicious she may have a yeast infection.  Though on review of chart, seems she has had BV in the past but does not frequently get yeast infections.  Denies any recent antibiotic exposure.  Sexually active with 1 partner, but would like STD testing "just to be sure."   DM2 Currently on Trulicity 0.75 mg weekly.  Not presently on any SGLT2 inhibitor.  A1c is elevated at 9.1% today.  Up from 7.6% 5 months ago.  She is not interested in increasing her Trulicity or adding another agent at this time as she just recently started [place] for migraines and does not want to cloud the clinical picture.   OBJECTIVE:   BP 123/86   Pulse 68   LMP 05/21/2017 (Exact Date)   SpO2 98%   Physical Exam Vitals reviewed. Exam conducted with a chaperone present.  Constitutional:      General: She is not in acute distress. HENT:     Mouth/Throat:     Mouth: Mucous membranes are moist.  Pulmonary:     Effort: Pulmonary effort is normal.  Genitourinary:    General: Normal vulva.     Vagina: Vaginal discharge (clumpy) present.     Cervix: Discharge present.  Musculoskeletal:        General: No swelling or deformity.      ASSESSMENT/PLAN:   Vaginal discharge History and exam consistent with a yeast infection.  Wet prep showed some bacteria but no other evidence of BV.  Unfortunately did not show any spores or hyphae.  However, given convincing history and exam, I will treat empirically for a yeast infection. -Fluconazole x 1 -STI test sent per patient preference: GC/chlamydia, trichomonas, HIV, RPR, hep C  Type 2 diabetes mellitus with hyperglycemia (HCC) Above goal at 9.1%.  Also overdue for ophthalmology exam.  Her preference to avoid making more medication changes at this moment given her recent addition of Ubrelvy for migraines is reasonable.  However, she will need close  interval PCP follow-up. -Continue Trulicity 0.75 mg weekly -Patient to make an appointment in 2 weeks with PCP Dr. Fredric Dine Dorothyann Gibbs, MD [facility]

## 2023-05-17 NOTE — Patient Instructions (Signed)
Your A1c is 9.1%, please make an appt with Dr. Sherrilee Gilles in the next few weeks to adjust your meds. I think you could go up on your Trulicity dose.  I will call you if your tests come back positive, I will send a MyChart message if they are negative.  Take the yeast medicine once. If no better in 3 days, take the second dose.  [name], MD

## 2023-05-18 LAB — RPR: RPR Ser Ql: NONREACTIVE

## 2023-05-18 LAB — BASIC METABOLIC PANEL
BUN/Creatinine Ratio: 17 (ref 9–23)
BUN: 19 mg/dL (ref 6–24)
CO2: 23 mmol/L (ref 20–29)
Calcium: 9.3 mg/dL (ref 8.7–10.2)
Chloride: 100 mmol/L (ref 96–106)
Creatinine, Ser: 1.14 mg/dL — ABNORMAL HIGH (ref 0.57–1.00)
Glucose: 287 mg/dL — ABNORMAL HIGH (ref 70–99)
Potassium: 4.1 mmol/L (ref 3.5–5.2)
Sodium: 138 mmol/L (ref 134–144)
eGFR: 56 mL/min/{1.73_m2} — ABNORMAL LOW (ref >59)

## 2023-05-18 LAB — HIV ANTIBODY (ROUTINE TESTING W REFLEX): HIV Screen 4th Generation wRfx: NONREACTIVE

## 2023-05-18 LAB — HCV INTERPRETATION

## 2023-05-18 LAB — HCV AB W REFLEX TO QUANT PCR: HCV Ab: NONREACTIVE

## 2023-05-20 ENCOUNTER — Other Ambulatory Visit (HOSPITAL_COMMUNITY): Payer: Self-pay

## 2023-05-20 ENCOUNTER — Other Ambulatory Visit: Payer: Self-pay

## 2023-05-20 ENCOUNTER — Encounter (HOSPITAL_BASED_OUTPATIENT_CLINIC_OR_DEPARTMENT_OTHER): Payer: Self-pay

## 2023-05-20 ENCOUNTER — Emergency Department (HOSPITAL_BASED_OUTPATIENT_CLINIC_OR_DEPARTMENT_OTHER): Payer: 59

## 2023-05-20 ENCOUNTER — Emergency Department (HOSPITAL_BASED_OUTPATIENT_CLINIC_OR_DEPARTMENT_OTHER)
Admission: EM | Admit: 2023-05-20 | Discharge: 2023-05-20 | Disposition: A | Discharge status: Home / Self Care | Payer: 59 | Attending: Emergency Medicine | Admitting: Emergency Medicine

## 2023-05-20 ENCOUNTER — Other Ambulatory Visit (HOSPITAL_BASED_OUTPATIENT_CLINIC_OR_DEPARTMENT_OTHER): Payer: Self-pay

## 2023-05-20 DIAGNOSIS — Z7984 Long term (current) use of oral hypoglycemic drugs: Secondary | ICD-10-CM | POA: Insufficient documentation

## 2023-05-20 DIAGNOSIS — R519 Headache, unspecified: Secondary | ICD-10-CM | POA: Insufficient documentation

## 2023-05-20 DIAGNOSIS — Z7952 Long term (current) use of systemic steroids: Secondary | ICD-10-CM | POA: Insufficient documentation

## 2023-05-20 DIAGNOSIS — Z7951 Long term (current) use of inhaled steroids: Secondary | ICD-10-CM | POA: Diagnosis not present

## 2023-05-20 DIAGNOSIS — Z79899 Other long term (current) drug therapy: Secondary | ICD-10-CM | POA: Insufficient documentation

## 2023-05-20 DIAGNOSIS — D72819 Decreased white blood cell count, unspecified: Secondary | ICD-10-CM | POA: Diagnosis not present

## 2023-05-20 DIAGNOSIS — J449 Chronic obstructive pulmonary disease, unspecified: Secondary | ICD-10-CM | POA: Diagnosis not present

## 2023-05-20 DIAGNOSIS — E1165 Type 2 diabetes mellitus with hyperglycemia: Secondary | ICD-10-CM | POA: Diagnosis not present

## 2023-05-20 DIAGNOSIS — Z794 Long term (current) use of insulin: Secondary | ICD-10-CM | POA: Insufficient documentation

## 2023-05-20 DIAGNOSIS — I10 Essential (primary) hypertension: Secondary | ICD-10-CM | POA: Diagnosis not present

## 2023-05-20 DIAGNOSIS — R739 Hyperglycemia, unspecified: Secondary | ICD-10-CM

## 2023-05-20 LAB — CBG MONITORING, ED: Glucose-Capillary: 180 mg/dL — ABNORMAL HIGH (ref 70–99)

## 2023-05-20 LAB — CERVICOVAGINAL ANCILLARY ONLY
Chlamydia: NEGATIVE
Comment: NEGATIVE
Comment: NEGATIVE
Comment: NORMAL
Neisseria Gonorrhea: NEGATIVE
Trichomonas: NEGATIVE

## 2023-05-20 LAB — BASIC METABOLIC PANEL
Anion gap: 10 (ref 5–15)
BUN: 15 mg/dL (ref 6–20)
CO2: 29 mmol/L (ref 22–32)
Calcium: 10.2 mg/dL (ref 8.9–10.3)
Chloride: 101 mmol/L (ref 98–111)
Creatinine, Ser: 0.87 mg/dL (ref 0.44–1.00)
GFR, Estimated: >60 mL/min (ref >60)
Glucose, Bld: 179 mg/dL — ABNORMAL HIGH (ref 70–99)
Potassium: 3.8 mmol/L (ref 3.5–5.1)
Sodium: 140 mmol/L (ref 135–145)

## 2023-05-20 LAB — CBC
HCT: 38.3 % (ref 36.0–46.0)
Hemoglobin: 13.1 g/dL (ref 12.0–15.0)
MCH: 31 pg (ref 26.0–34.0)
MCHC: 34.2 g/dL (ref 30.0–36.0)
MCV: 90.8 fL (ref 80.0–100.0)
Platelets: 258 10*3/uL (ref 150–400)
RBC: 4.22 MIL/uL (ref 3.87–5.11)
RDW: 12.5 % (ref 11.5–15.5)
WBC: 3.8 10*3/uL — ABNORMAL LOW (ref 4.0–10.5)
nRBC: 0 % (ref 0.0–0.2)

## 2023-05-20 LAB — URINALYSIS, ROUTINE W REFLEX MICROSCOPIC
Bilirubin Urine: NEGATIVE
Glucose, UA: NEGATIVE mg/dL
Hgb urine dipstick: NEGATIVE
Ketones, ur: NEGATIVE mg/dL
Leukocytes,Ua: NEGATIVE
Nitrite: NEGATIVE
Protein, ur: NEGATIVE mg/dL
Specific Gravity, Urine: 1.012 (ref 1.005–1.030)
pH: 6 (ref 5.0–8.0)

## 2023-05-20 MED ORDER — KETOROLAC TROMETHAMINE 15 MG/ML IJ SOLN
15.0000 mg | Freq: Once | INTRAMUSCULAR | Status: AC
  Administered 2023-05-20: 15 mg via INTRAVENOUS
  Filled 2023-05-20: qty 1

## 2023-05-20 MED ORDER — ONDANSETRON 4 MG PO TBDP
4.0000 mg | ORAL_TABLET | Freq: Three times a day (TID) | ORAL | 0 refills | Status: DC | PRN
Start: 2023-05-20 — End: 2023-05-20
  Filled 2023-05-20: qty 18, 21d supply, fill #0

## 2023-05-20 MED ORDER — PROCHLORPERAZINE EDISYLATE 10 MG/2ML IJ SOLN
10.0000 mg | Freq: Once | INTRAMUSCULAR | Status: AC
  Administered 2023-05-20: 10 mg via INTRAVENOUS
  Filled 2023-05-20: qty 2

## 2023-05-20 MED ORDER — LACTATED RINGERS IV BOLUS
500.0000 mL | Freq: Once | INTRAVENOUS | Status: AC
  Administered 2023-05-20: 500 mL via INTRAVENOUS

## 2023-05-20 MED ORDER — ONDANSETRON 4 MG PO TBDP
4.0000 mg | ORAL_TABLET | Freq: Three times a day (TID) | ORAL | 0 refills | Status: DC | PRN
  Filled 2023-05-20 (×3): qty 18, 21d supply, fill #0
  Filled 2023-08-15: qty 18, 21d supply, fill #1

## 2023-05-20 NOTE — ED Notes (Signed)
Patient transported to CT 

## 2023-05-20 NOTE — ED Triage Notes (Signed)
Pt presents c/o of hyperglycemia, hx of T2DM. Started yesterday, with sx of tasting sugar, headaches, and blurred vision. Pt checked blood sugar at home around midnight last night and was 290. Pt re-checked blood sugar around 4am and was 227. Pt took diabetic medications this morning around 7am, CBG 205.  CBG 180 upon arrival. PT currently experiencing some blurred vision with headache present. Feels as though right eye is strained.

## 2023-05-20 NOTE — Assessment & Plan Note (Signed)
Above goal at 9.1%.  Also overdue for ophthalmology exam.  Her preference to avoid making more medication changes at this moment given her recent addition of Ubrelvy for migraines is reasonable.  However, she will need close interval PCP follow-up. -Continue Trulicity 0.75 mg weekly -Patient to make an appointment in 2 weeks with PCP Dr. Sherrilee Gilles

## 2023-05-20 NOTE — ED Provider Notes (Signed)
[place] EMERGENCY DEPARTMENT AT [facility] Provider Note   CSN: [id number] Arrival date & time: 05/20/23  7829     History  Chief Complaint  Patient presents with   Hyperglycemia    [name] is a 57 y.o. female.  57 year old female with past medical history significant for COPD, OSA, type 2 diabetes presents today for concern of elevated blood sugars.  She states over the past several days she has noted vision change, urinary frequency.  She does not routinely check her blood sugars but the symptoms prompted her to check her blood sugars which she noticed was 316 last night.  She states she ate some cinnamon and drank a lot of fluids and this morning noticed that it was downtrending to about 270.  She also endorses headache which is not unusual but this particular episode started about a week ago.  Nausea and vision change has been new this past week as well.  Recently had an eye exam done and has not gotten her new corrective lenses with the updated prescription.  Recently saw PCP on Friday.  Uptrend in A1c.  Opted to defer starting new adjunct of antihyperglycemic due to starting a new migraine medication recently.  The history is provided by the patient. No language interpreter was used.       Home Medications Prior to Admission medications   Medication Sig Start Date End Date Taking? Authorizing Provider  albuterol (VENTOLIN HFA) 108 (90 Base) MCG/ACT inhaler Inhale 2 puffs into the lungs every 6 (six) hours as needed for wheezing or shortness of breath. 08/17/22   Kendra Saner, MD  azelastine (ASTELIN) 0.1 % nasal spray Place 1 spray into both nostrils 2 (two) times daily. Use in each nostril as directed 01/15/22   Kelly, Kendra Cooler, MD  benzonatate (TESSALON) 100 MG capsule Take 1 capsule (100 mg total) by mouth 2 (two) times daily as needed for cough. 08/07/22   Kendra Brigham, MD  BIOTIN PO Take 2,000 mg by mouth daily.    [provider]   blood glucose meter kit and supplies KIT Use as directed. 11/10/21   Kendra Chandler, MD  busPIRone (BUSPAR) 5 MG tablet Take 1.5 tablets (7.5 mg total) by mouth 2 (two) times daily. 01/17/23   Kendra Brigham, MD  cetirizine (ZYRTEC) 10 MG tablet Take 1 tablet (10 mg total) by mouth daily. 02/07/21   Kelly, Makiera, MD  Dulaglutide (TRULICITY) 0.75 MG/0.5ML SOPN Inject 0.75 mg into the skin once a week. 02/25/23   Kendra Brigham, MD  Dupilumab (DUPIXENT) 300 MG/2ML SOPN Inject 300 mg into the skin every 14 (fourteen) days. Loading dose of 600mg  received on 11/07/21 12/05/22   Kendra Saner, MD  Elastic Bandages & Supports (SHOULDER BRACE LARGE) MISC 1 each by Does not apply route as needed. Patient not taking: Reported on 03/06/2023 03/24/20   Kendra Buddy, MD  esomeprazole (NEXIUM) 40 MG capsule Take 1 capsule (40 mg total) by mouth daily. 04/11/23   Kendra Brigham, MD  estradiol (ESTRACE VAGINAL) 0.1 MG/GM vaginal cream Place 1 g vaginally daily for 2 weeks, then use twice weekly thereafter. 02/26/22   Kendra Hock, MD  fluconazole (DIFLUCAN) 150 MG tablet Take one pill now for 1 dose. If still having symptoms after 3 days, take a second pill. 05/17/23   Kendra Amel, MD  fluorometholone (FML) 0.1 % ophthalmic suspension Place 1 drop into both eyes in the morning and at bedtime. 12/03/20   [provider]  fluticasone (FLONASE) 50 MCG/ACT nasal spray Place 2 sprays into both nostrils daily. 08/07/22   Kendra Brigham, MD  Fluticasone-Umeclidin-Vilant (TRELEGY ELLIPTA) 200-62.5-25 MCG/ACT AEPB Inhale 1 puff into the lungs daily. 12/17/22   Kendra Saner, MD  gabapentin (NEURONTIN) 300 MG capsule Take 1 capsule (300 mg total) by mouth every 8 (eight) hours as needed for pain 02/07/23   Kendra Brigham, MD  Galcanezumab-gnlm [facility]) 120 MG/ML SOAJ Inject 120 mg into the skin every 30 (thirty) days. 01/22/23   Lomax, Amy, NP  glucose blood (FREESTYLE LITE) test strip Use as directed 2 times  daily. 05/15/23   Kendra Brigham, MD  Lancets (FREESTYLE) lancets Use as directed 2 times daily. 05/15/23   Kendra Brigham, MD  levalbuterol Paul[facility] Aus) 1.25 MG/3ML nebulizer solution Take 1.25 mg by nebulization every 8 (eight) hours as needed for wheezing or shortness of breath. 01/17/22   Kendra Saner, MD  losartan-hydrochlorothiazide (HYZAAR) 100-25 MG tablet Take 1 tablet by mouth daily. 04/11/23   Kendra Brigham, MD  methylPREDNISolone (MEDROL DOSEPAK) 4 MG TBPK tablet Use as directed 04/24/23   Kendra Saner, MD  Multiple Vitamin (MULTIVITAMIN WITH MINERALS) TABS tablet Take 1 tablet by mouth daily.    [provider]  naproxen (NAPROSYN) 500 MG tablet Take 1 tablet (500 mg total) by mouth 2 (two) times daily as needed for mild or moderate pain 05/15/23   Kendra Brigham, MD  nystatin [facility]) powder Apply 1 application topically 3 (three) times daily. 05/15/23   Kendra Brigham, MD  oxybutynin (DITROPAN XL) 15 MG 24 hr tablet Take 1 tablet (15 mg total) by mouth at bedtime. 12/20/22   Kendra Brigham, MD  phenazopyridine (PYRIDIUM) 200 MG tablet Take 1 tablet (200 mg total) by mouth 3 (three) times daily as needed for pain. 02/21/23   Kendra Pax, NP  Polyethyl Glycol-Propyl Glycol (SYSTANE OP) Place 1 drop into both eyes in the morning and at bedtime.    [provider]  Pyridoxine HCl (VITAMIN B-6 PO) Take 1 tablet by mouth daily. Unknown OTC strength    [provider]  rosuvastatin (CRESTOR) 10 MG tablet Take 1 tablet (10 mg total) by mouth daily. 04/11/23   Kendra Brigham, MD  sertraline (ZOLOFT) 50 MG tablet Take 1 tablet (50 mg total) by mouth daily. 12/20/22   Kendra Brigham, MD  Spacer/Aero-Holding Chambers (AEROCHAMBER MV) inhaler Use as instructed 05/09/15   Stephanie Acre, MD  Ubrogepant (UBRELVY) 100 MG TABS Take 1 tablet (100 mg) at onset of migraine as needed. If needed, a second dose may be administered at least 2 hours after the initial dose.  The maximum dose in a  24-hour period is 200 mg. 03/18/23   Lomax, Amy, NP  vitamin C (ASCORBIC ACID) 500 MG tablet Take 500 mg by mouth daily.     [provider]      Allergies    Amitriptyline, Fluticasone-salmeterol, Imitrex [sumatriptan], Penicillins, Ciprofloxacin, Macrobid [nitrofurantoin monohyd macro], and Sulfamethoxazole-trimethoprim    Review of Systems   Review of Systems  Constitutional:  Negative for chills and fever.  Eyes:  Positive for visual disturbance.  Respiratory:  Negative for shortness of breath.   Gastrointestinal:  Positive for nausea. Negative for abdominal pain and vomiting.  Genitourinary:  Positive for frequency. Negative for dysuria.  Neurological:  Positive for headaches. Negative for syncope, weakness and light-headedness.  All other systems reviewed and are negative.   Physical Exam Updated Vital Signs BP (!) 144/121 (BP  Location: Left Arm)   Pulse 60   Temp 98 F (36.7 C) (Oral)   Resp 20   Ht 5\' 1"  (1.549 m)   Wt 94.3 kg   LMP 05/21/2017 (Exact Date)   SpO2 100%   BMI 39.30 kg/m  Physical Exam Vitals and nursing note reviewed.  Constitutional:      General: She is not in acute distress.    Appearance: Normal appearance. She is not ill-appearing.  HENT:     Head: Normocephalic and atraumatic.     Nose: Nose normal.  Eyes:     General: No scleral icterus.    Extraocular Movements: Extraocular movements intact.     Conjunctiva/sclera: Conjunctivae normal.  Cardiovascular:     Rate and Rhythm: Normal rate and regular rhythm.     Heart sounds: Normal heart sounds.  Pulmonary:     Effort: Pulmonary effort is normal. No respiratory distress.     Breath sounds: Normal breath sounds. No wheezing or rales.  Musculoskeletal:        General: Normal range of motion.     Cervical back: Normal range of motion.  Skin:    General: Skin is warm and dry.  Neurological:     General: No focal deficit present.     Mental Status: She is alert and oriented to  person, place, and time. Mental status is at baseline.     Cranial Nerves: No cranial nerve deficit.     Motor: No weakness.     ED Results / Procedures / Treatments   Labs (all labs ordered are listed, but only abnormal results are displayed) Labs Reviewed  BASIC METABOLIC PANEL - Abnormal; Notable for the following components:      Result Value   Glucose, Bld 179 (*)    All other components within normal limits  CBC - Abnormal; Notable for the following components:   WBC 3.8 (*)    All other components within normal limits  CBG MONITORING, ED - Abnormal; Notable for the following components:   Glucose-Capillary 180 (*)    All other components within normal limits  URINALYSIS, ROUTINE W REFLEX MICROSCOPIC  CBG MONITORING, ED    EKG None  Radiology No results found.  Procedures Procedures    Medications Ordered in ED Medications  lactated ringers bolus 500 mL (500 mLs Intravenous New Bag/Given 05/20/23 1025)    ED Course/ Medical Decision Making/ A&P                             Medical Decision Making Amount and/or Complexity of Data Reviewed Labs: ordered. Radiology: ordered.  Risk Prescription drug management.   57 year old female with past medical history significant for COPD, OSA, type 2 diabetes, hypertension presents today for concern of hypoglycemia.  She infrequently checks her blood sugars and noticed that last night it was in the low 300s.  Improved to upper 200s today however due to patient's symptoms he was concerned and presented to the ED.  She does have history of migraines as well and over the past week she has had a headache.  Endorses some nausea and vision changes as well.  Recently seen ophthalmologist as well.  States she has a new corrective lenses prescription as well but has not gotten new eyeglasses.  Neurological exam without focal deficits.  CBC with mild leukopenia, no anemia.  UA without evidence of UTI.  BMP with glucose of 179  otherwise without acute  findings.  CT head obtained due to headache, vision change.  No acute intracranial finding.  Will give migraine cocktail.  500 mL fluid bolus given.  No concerning findings on workup.  She is appropriate for discharge to follow-up with her ophthalmologist as well as PCP.  She voices understanding and is in agreement with plan.  No indication for admission or further workup at this time.     Final Clinical Impression(s) / ED Diagnoses Final diagnoses:  Hyperglycemia  Nonintractable headache, unspecified chronicity pattern, unspecified headache type    Rx / DC Orders ED Discharge Orders     None         Marita Kansas, PA-C 05/20/23 1132    Cathren Laine, MD 05/20/23 1356

## 2023-05-20 NOTE — Discharge Instructions (Signed)
Your workup today was reassuring.  Blood sugars improved and were about 180 in the emergency department.  You received IV fluids, and migraine cocktail.  Follow-up with your eye doctor.  Check your blood sugars a bit more routinely.  For any concerning symptoms return to the emergency room.  Otherwise please follow-up with your primary care doctor regarding the elevated blood sugars.

## 2023-05-20 NOTE — Assessment & Plan Note (Signed)
History and exam consistent with a yeast infection.  Wet prep showed some bacteria but no other evidence of BV.  Unfortunately did not show any spores or hyphae.  However, given convincing history and exam, I will treat empirically for a yeast infection. -Fluconazole x 1 -STI test sent per patient preference: GC/chlamydia, trichomonas, HIV, RPR, hep C

## 2023-05-21 ENCOUNTER — Encounter: Payer: Self-pay | Admitting: Family Medicine

## 2023-05-22 DIAGNOSIS — F3289 Other specified depressive episodes: Secondary | ICD-10-CM | POA: Diagnosis not present

## 2023-05-22 DIAGNOSIS — F418 Other specified anxiety disorders: Secondary | ICD-10-CM | POA: Diagnosis not present

## 2023-05-24 ENCOUNTER — Ambulatory Visit
Admission: RE | Admit: 2023-05-24 | Discharge: 2023-05-24 | Disposition: A | Discharge status: Home / Self Care | Payer: 59 | Attending: Adult Health | Admitting: Adult Health

## 2023-05-24 ENCOUNTER — Encounter: Payer: Self-pay | Admitting: Adult Health

## 2023-05-24 ENCOUNTER — Other Ambulatory Visit
Admission: RE | Admit: 2023-05-24 | Discharge: 2023-05-24 | Disposition: A | Discharge status: Home / Self Care | Payer: 59 | Source: Home / Self Care | Attending: Adult Health | Admitting: Adult Health

## 2023-05-24 ENCOUNTER — Ambulatory Visit: Payer: 59 | Admitting: Adult Health

## 2023-05-24 ENCOUNTER — Ambulatory Visit
Admission: RE | Admit: 2023-05-24 | Discharge: 2023-05-24 | Disposition: A | Discharge status: Home / Self Care | Payer: 59 | Source: Ambulatory Visit | Attending: Adult Health | Admitting: Adult Health

## 2023-05-24 ENCOUNTER — Other Ambulatory Visit (HOSPITAL_COMMUNITY): Payer: Self-pay

## 2023-05-24 ENCOUNTER — Ambulatory Visit
Admission: RE | Admit: 2023-05-24 | Discharge: 2023-05-24 | Disposition: A | Discharge status: Home / Self Care | Payer: 59 | Source: Ambulatory Visit | Attending: Obstetrics and Gynecology | Admitting: Obstetrics and Gynecology

## 2023-05-24 VITALS — BP 124/80 | HR 66 | Temp 97.3°F | Ht 61.0 in | Wt 207.0 lb

## 2023-05-24 DIAGNOSIS — J4551 Severe persistent asthma with (acute) exacerbation: Secondary | ICD-10-CM

## 2023-05-24 DIAGNOSIS — R0602 Shortness of breath: Secondary | ICD-10-CM | POA: Diagnosis not present

## 2023-05-24 DIAGNOSIS — R0609 Other forms of dyspnea: Secondary | ICD-10-CM | POA: Insufficient documentation

## 2023-05-24 DIAGNOSIS — G4733 Obstructive sleep apnea (adult) (pediatric): Secondary | ICD-10-CM | POA: Diagnosis not present

## 2023-05-24 DIAGNOSIS — Z1231 Encounter for screening mammogram for malignant neoplasm of breast: Secondary | ICD-10-CM | POA: Diagnosis not present

## 2023-05-24 DIAGNOSIS — J4550 Severe persistent asthma, uncomplicated: Secondary | ICD-10-CM | POA: Diagnosis not present

## 2023-05-24 DIAGNOSIS — J45901 Unspecified asthma with (acute) exacerbation: Secondary | ICD-10-CM | POA: Diagnosis not present

## 2023-05-24 LAB — D-DIMER, QUANTITATIVE: D-Dimer, Quant: 0.33 ug/mL-FEU (ref 0.00–0.50)

## 2023-05-24 MED ORDER — BENZONATATE 200 MG PO CAPS
200.0000 mg | ORAL_CAPSULE | Freq: Three times a day (TID) | ORAL | 3 refills | Status: DC | PRN
  Filled 2023-05-24: qty 45, 15d supply, fill #0

## 2023-05-24 MED ORDER — LEVALBUTEROL HCL 0.63 MG/3ML IN NEBU
0.6300 mg | INHALATION_SOLUTION | Freq: Once | RESPIRATORY_TRACT | Status: AC
Start: 2023-05-24 — End: 2023-05-24
  Administered 2023-05-24: 0.63 mg via RESPIRATORY_TRACT

## 2023-05-24 MED ORDER — LEVALBUTEROL HCL 1.25 MG/3ML IN NEBU
1.2500 mg | INHALATION_SOLUTION | Freq: Three times a day (TID) | RESPIRATORY_TRACT | 2 refills | Status: AC | PRN
  Filled 2023-05-24: qty 270, 30d supply, fill #0

## 2023-05-24 NOTE — Assessment & Plan Note (Signed)
Intermittent episodes of dyspnea over the last 4 weeks.  Questionable etiology suspect is related to her asthma not been under good control.  Patient has had recent travel to [place].  Exam is on revealing.  Negative calf tenderness or swelling.  No tachycardia or hypoxia.  Will check D-dimer.  If positive consider CT chest to rule out underlying PE.   Check chest x-ray today.  Plan  Patient Instructions  Chest xray today.  Delsym 2 tsp .Twice daily  for cough  Tessalon Three times a day  for cough  Trelegy 1 puff daily, rinse after use  Albuterol inhaler or Xopenex neb As needed   Dupixent every 14 days as planned.  Continue on Zyrtec, Flonase and Astelin daily .  Labs today .  Follow up with Dr. Jayme Cloud in 4-6 weeks and .As needed   Please contact office for sooner follow up if symptoms do not improve or worsen or seek emergency care

## 2023-05-24 NOTE — Assessment & Plan Note (Signed)
Slow to resolved asthmatic bronchitic exacerbation-patient has had a lingering symptoms over the last 4 weeks despite Z-Pak and prednisone taper.  Symptoms are improving but not totally resolved.  Will check chest x-ray today.  Xopenex nebulizer treatment given today in the office.  Refill of her neb medicines for home use as needed.  Asthma action plan discussed.  Continue on triple therapy maintenance regimen with Trelegy inhaler daily.  Continue on Dupixent every 2 weeks.  Continue on allergy regimen with Zyrtec Flonase and Astelin.  Cough control regimen with Delsym and Tessalon.  Hold on further steroids at this time as she has been having difficulty with hyperglycemia  Plan  Patient Instructions  Chest xray today.  Delsym 2 tsp .Twice daily  for cough  Tessalon Three times a day  for cough  Trelegy 1 puff daily, rinse after use  Albuterol inhaler or Xopenex neb As needed   Dupixent every 14 days as planned.  Continue on Zyrtec, Flonase and Astelin daily .  Labs today .  Follow up with Dr. Jayme Cloud in 4-6 weeks and .As needed   Please contact office for sooner follow up if symptoms do not improve or worsen or seek emergency care

## 2023-05-24 NOTE — Patient Instructions (Signed)
Chest xray today.  Delsym 2 tsp .Twice daily  for cough  Tessalon Three times a day  for cough  Trelegy 1 puff daily, rinse after use  Albuterol inhaler or Xopenex neb As needed   Dupixent every 14 days as planned.  Continue on Zyrtec, Flonase and Astelin daily .  Labs today .  Follow up with Dr. Jayme Cloud in 4-6 weeks and .As needed   Please contact office for sooner follow up if symptoms do not improve or worsen or seek emergency care

## 2023-05-24 NOTE — Progress Notes (Signed)
@Patient  ID: Jillyn Hidden, female    DOB: May 06, 1966, 57 y.o.   MRN: [id number]  Chief Complaint  Patient presents with   Acute Visit    Referring provider: Lincoln Brigham, MD  HPI: 57 year old female former smoker followed for severe persistent asthma with allergic phenotype and chronic rhinosinusitis and sleep apnea on nocturnal CPAP  TEST/EVENTS :   05/24/2023 Acute OV : Asthma  Patient presents for an acute office visit.  Complains over the last month that she has had increased asthma symptoms.  She went on vacation to [place] about 1 month ago.  She returned with cold-like symptoms.  She had increased cough, congestion with thick green mucus and intermittent shortness of breath and wheezing.  She was called in a Z-Pak and Medrol Dosepak.  She says symptoms improved but have not returned to baseline.  She continues to have some ongoing wheezing and coughing.  She remains on Trelegy daily.  She is on Dupixent every 2 weeks.  She takes Zyrtec and Flonase/Astelin nasal spray One of friends that went got sick with cold like symptoms.  Covid home test was negative.  Has been having difficulties with recurrent migraines and elevated blood sugars with her DM.  Cough is some better. Still has intermittent wheezing/dyspnea.  No leg swelling or calf pain. No hemoptysis . No personal or family hx of VTE.   Allergies  Allergen Reactions   Amitriptyline     Abnormal behavior. Just doesn't feel like normal self    Fluticasone-Salmeterol Other (See Comments)    Thrush even with mouth rinsing; worsens cough   Imitrex [Sumatriptan]     Made her head feel like its on fire   Penicillins Itching and Swelling    Has patient had a PCN reaction causing immediate rash, facial/tongue/throat swelling, SOB or lightheadedness with hypotension: No Has patient had a PCN reaction causing severe rash involving mucus membranes or skin necrosis: No Has patient had a PCN reaction that required hospitalization:  No Has patient had a PCN reaction occurring within the last 10 years: Yes If all of the above answers are "NO", then may proceed with Cephalosporin use.    Ciprofloxacin Itching and Rash   Macrobid [Nitrofurantoin Monohyd Macro] Itching   Sulfamethoxazole-Trimethoprim Rash    Immunization History  Administered Date(s) Administered   Influenza Split 07/22/2016   Influenza Whole 07/28/2008   Influenza,inj,Quad PF,6+ Mos 07/29/2013, 07/13/2019   Influenza-Unspecified 08/12/2014, 07/29/2015, 07/28/2017, 07/29/2020, 07/26/2021, 08/02/2022   Moderna Sars-Covid-2 Vaccination 10/21/2019, 11/09/2019   PFIZER Comirnaty(Gray Top)Covid-19 Tri-Sucrose Vaccine 07/19/2020   Pneumococcal Polysaccharide-23 08/01/2016   Td 05/29/2002   Tdap 09/05/2016    Past Medical History:  Diagnosis Date   Allergy    Anemia    Anxiety    Arthritis    all over   Asthma    COPD (chronic obstructive pulmonary disease) (HCC)    Depression    Dysrhythmia    Irregular heat w/ asthma episodes and anxiety   GERD (gastroesophageal reflux disease)    Hyperlipidemia    Hypertension    Migraines    PONV (postoperative nausea and vomiting)    Pre-diabetes    Sleep apnea    uses CPAP   Urinary incontinence    frequent urination- tx w/ vesicare   Urinary urgency     Tobacco History: Social History   Tobacco Use  Smoking Status Former   Current packs/day: 0.00   Average packs/day: 0.3 packs/day for 15.0 years (3.8 ttl pk-yrs)  Types: Cigarettes   Start date: 02/16/1988   Quit date: 02/16/2003   Years since quitting: 20.2  Smokeless Tobacco Never   Counseling given: Not Answered   Outpatient Medications Prior to Visit  Medication Sig Dispense Refill   albuterol (VENTOLIN HFA) 108 (90 Base) MCG/ACT inhaler Inhale 2 puffs into the lungs every 6 (six) hours as needed for wheezing or shortness of breath. 6.7 g 6   azelastine (ASTELIN) 0.1 % nasal spray Place 1 spray into both nostrils 2 (two) times  daily. Use in each nostril as directed 30 mL 12   BIOTIN PO Take 2,000 mg by mouth daily.     blood glucose meter kit and supplies KIT Use as directed. 1 each 0   busPIRone (BUSPAR) 5 MG tablet Take 1.5 tablets (7.5 mg total) by mouth 2 (two) times daily. 90 tablet 1   cetirizine (ZYRTEC) 10 MG tablet Take 1 tablet (10 mg total) by mouth daily. 30 tablet 11   Dulaglutide (TRULICITY) 0.75 MG/0.5ML SOPN Inject 0.75 mg into the skin once a week. 2 mL 1   Dupilumab (DUPIXENT) 300 MG/2ML SOPN Inject 300 mg into the skin every 14 (fourteen) days. Loading dose of 600mg  received on 11/07/21 12 mL 1   Elastic Bandages & Supports (SHOULDER BRACE LARGE) MISC 1 each by Does not apply route as needed. (Patient not taking: Reported on 03/06/2023) 1 each 0   esomeprazole (NEXIUM) 40 MG capsule Take 1 capsule (40 mg total) by mouth daily. 90 capsule 1   estradiol (ESTRACE VAGINAL) 0.1 MG/GM vaginal cream Place 1 g vaginally daily for 2 weeks, then use twice weekly thereafter. 42.5 g 2   fluconazole (DIFLUCAN) 150 MG tablet Take one pill now for 1 dose. If still having symptoms after 3 days, take a second pill. 2 tablet 0   fluorometholone (FML) 0.1 % ophthalmic suspension Place 1 drop into both eyes in the morning and at bedtime.     fluticasone (FLONASE) 50 MCG/ACT nasal spray Place 2 sprays into both nostrils daily. 16 g 6   Fluticasone-Umeclidin-Vilant (TRELEGY ELLIPTA) 200-62.5-25 MCG/ACT AEPB Inhale 1 puff into the lungs daily. 60 each 11   gabapentin (NEURONTIN) 300 MG capsule Take 1 capsule (300 mg total) by mouth every 8 (eight) hours as needed for pain 90 capsule 2   Galcanezumab-gnlm (EMGALITY) 120 MG/ML SOAJ Inject 120 mg into the skin every 30 (thirty) days. 3 mL 3   glucose blood (FREESTYLE LITE) test strip Use as directed 2 times daily. 100 each 0   Lancets (FREESTYLE) lancets Use as directed 2 times daily. 100 each 0   levalbuterol (XOPENEX) 1.25 MG/3ML nebulizer solution Take 1.25 mg by nebulization  every 8 (eight) hours as needed for wheezing or shortness of breath. 90 mL 2   losartan-hydrochlorothiazide (HYZAAR) 100-25 MG tablet Take 1 tablet by mouth daily. 90 tablet 1   methylPREDNISolone (MEDROL DOSEPAK) 4 MG TBPK tablet Use as directed (Patient not taking: Reported on 05/24/2023) 21 each 0   Multiple Vitamin (MULTIVITAMIN WITH MINERALS) TABS tablet Take 1 tablet by mouth daily.     naproxen (NAPROSYN) 500 MG tablet Take 1 tablet (500 mg total) by mouth 2 (two) times daily as needed for mild or moderate pain 60 tablet 0   nystatin (NYAMYC) powder Apply 1 application topically 3 (three) times daily. 60 g 0   ondansetron (ZOFRAN-ODT) 4 MG disintegrating tablet Dissolve 1 tablet under the tongue every 8 (eight) hours as needed. 20 tablet 0  oxybutynin (DITROPAN XL) 15 MG 24 hr tablet Take 1 tablet (15 mg total) by mouth at bedtime. 90 tablet 1   phenazopyridine (PYRIDIUM) 200 MG tablet Take 1 tablet (200 mg total) by mouth 3 (three) times daily as needed for pain. 6 tablet 0   Polyethyl Glycol-Propyl Glycol (SYSTANE OP) Place 1 drop into both eyes in the morning and at bedtime.     Pyridoxine HCl (VITAMIN B-6 PO) Take 1 tablet by mouth daily. Unknown OTC strength     rosuvastatin (CRESTOR) 10 MG tablet Take 1 tablet (10 mg total) by mouth daily. 90 tablet 1   sertraline (ZOLOFT) 50 MG tablet Take 1 tablet (50 mg total) by mouth daily. 90 tablet 3   Spacer/Aero-Holding Chambers (AEROCHAMBER MV) inhaler Use as instructed 1 each 0   Ubrogepant (UBRELVY) 100 MG TABS Take 1 tablet (100 mg) at onset of migraine as needed. If needed, a second dose may be administered at least 2 hours after the initial dose.  The maximum dose in a 24-hour period is 200 mg. 10 tablet 5   vitamin C (ASCORBIC ACID) 500 MG tablet Take 500 mg by mouth daily.      benzonatate (TESSALON) 100 MG capsule Take 1 capsule (100 mg total) by mouth 2 (two) times daily as needed for cough. 20 capsule 0   No facility-administered  medications prior to visit.     Review of Systems:   Constitutional:   No  weight loss, night sweats,  Fevers, chills, + fatigue, or  lassitude.  HEENT:   No headaches,  Difficulty swallowing,  Tooth/dental problems, or  Sore throat,                No sneezing, itching, ear ache,  +nasal congestion, post nasal drip,   CV:  No chest pain,  Orthopnea, PND, swelling in lower extremities, anasarca, dizziness, palpitations, syncope.   GI  No heartburn, indigestion, abdominal pain, nausea, vomiting, diarrhea, change in bowel habits, loss of appetite, bloody stools.   Resp: .  No chest wall deformity  Skin: no rash or lesions.  GU: no dysuria, change in color of urine, no urgency or frequency.  No flank pain, no hematuria   MS:  No joint pain or swelling.  No decreased range of motion.  No back pain.    Physical Exam  BP 124/80 (BP Location: Left Arm, Cuff Size: Large)   Pulse 66   Temp (!) 97.3 F (36.3 C)   Ht 5\' 1"  (1.549 m)   Wt 207 lb (93.9 kg)   LMP 05/21/2017 (Exact Date)   SpO2 97%   BMI 39.11 kg/m   GEN: A/Ox3; pleasant , NAD, well nourished    HEENT:  [place]/AT,  NOSE-clear, THROAT-clear, no lesions, no postnasal drip or exudate noted.   NECK:  Supple w/ fair ROM; no JVD; normal carotid impulses w/o bruits; no thyromegaly or nodules palpated; no lymphadenopathy.    RESP  Clear  P & A; w/o, wheezes/ rales/ or rhonchi. no accessory muscle use, no dullness to percussion  CARD:  RRR, no m/r/g, no peripheral edema, pulses intact, no cyanosis or clubbing.  GI:   Soft & nt; nml bowel sounds; no organomegaly or masses detected.   Musco: Warm bil, no deformities or joint swelling noted.   Neuro: alert, no focal deficits noted.    Skin: Warm, no lesions or rashes    Lab Results:  CBC    Component Value Date/Time   WBC 3.8 (  L) 05/20/2023 0945   RBC 4.22 05/20/2023 0945   HGB 13.1 05/20/2023 0945   HGB 10.9 (L) 01/01/2022 1558   HCT 38.3 05/20/2023 0945   HCT  33.1 (L) 01/01/2022 1558   PLT 258 05/20/2023 0945   PLT 352 01/01/2022 1558   MCV 90.8 05/20/2023 0945   MCV 91 01/01/2022 1558   MCH 31.0 05/20/2023 0945   MCHC 34.2 05/20/2023 0945   RDW 12.5 05/20/2023 0945   RDW 11.6 (L) 01/01/2022 1558   LYMPHSABS 1.2 01/01/2022 1558   MONOABS 0.6 07/19/2020 1217   EOSABS 0.1 01/01/2022 1558   BASOSABS 0.1 01/01/2022 1558    BMET    Component Value Date/Time   NA 140 05/20/2023 0945   NA 138 05/17/2023 1425   K 3.8 05/20/2023 0945   CL 101 05/20/2023 0945   CO2 29 05/20/2023 0945   GLUCOSE 179 (H) 05/20/2023 0945   BUN 15 05/20/2023 0945   BUN 19 05/17/2023 1425   CREATININE 0.87 05/20/2023 0945   CREATININE 0.82 09/05/2016 1653   CALCIUM 10.2 05/20/2023 0945   GFRNONAA >60 05/20/2023 0945   GFRNONAA 84 09/05/2016 1653   GFRAA 78 05/07/2019 1211   GFRAA >89 09/05/2016 1653    BNP    Component Value Date/Time   BNP 5.2 02/27/2018 1219   BNP 19.8 11/28/2015 1036    ProBNP    Component Value Date/Time   PROBNP 43.0 07/28/2014 1815    Imaging: CT Head Wo Contrast  Result Date: 05/20/2023 CLINICAL DATA:  Headache, new onset. Hyperglycemia in setting of diabetes EXAM: CT HEAD WITHOUT CONTRAST TECHNIQUE: Contiguous axial images were obtained from the base of the skull through the vertex without intravenous contrast. RADIATION DOSE REDUCTION: This exam was performed according to the departmental dose-optimization program which includes automated exposure control, adjustment of the mA and/or kV according to patient size and/or use of iterative reconstruction technique. COMPARISON:  05/13/2019 brain MRI FINDINGS: Brain: No evidence of acute infarction, hemorrhage, hydrocephalus, extra-axial collection or mass lesion/mass effect. Vascular: No hyperdense vessel or unexpected calcification. Skull: Normal. Negative for fracture or focal lesion. Sinuses/Orbits: No acute finding. IMPRESSION: Normal head CT. Electronically Signed   By:  Tiburcio Pea M.D.   On: 05/20/2023 10:57    Administration History     None          Latest Ref Rng & Units 06/02/2015    1:18 PM  PFT Results  FVC-Pre L 2.05  P  FVC-Predicted Pre % 75  P  FVC-Post L 1.99  P  FVC-Predicted Post % 73  P  Pre FEV1/FVC % % 93  P  Post FEV1/FCV % % 94  P  FEV1-Pre L 1.90  P  FEV1-Predicted Pre % 87  P  FEV1-Post L 1.86  P  DLCO uncorrected ml/min/mmHg 15.32  P  DLCO UNC% % 71  P  DLVA Predicted % 105  P  TLC L 3.59  P  TLC % Predicted % 75  P  RV % Predicted % 75  P    P Preliminary result    Lab Results  Component Value Date   NITRICOXIDE 17 12/04/2022        Assessment & Plan:   Asthma Slow to resolved asthmatic bronchitic exacerbation-patient has had a lingering symptoms over the last 4 weeks despite Z-Pak and prednisone taper.  Symptoms are improving but not totally resolved.  Will check chest x-ray today.  Xopenex nebulizer treatment given today in the office.  Refill of her neb medicines for home use as needed.  Asthma action plan discussed.  Continue on triple therapy maintenance regimen with Trelegy inhaler daily.  Continue on Dupixent every 2 weeks.  Continue on allergy regimen with Zyrtec Flonase and Astelin.  Cough control regimen with Delsym and Tessalon.  Hold on further steroids at this time as she has been having difficulty with hyperglycemia  Plan  Patient Instructions  Chest xray today.  Delsym 2 tsp .Twice daily  for cough  Tessalon Three times a day  for cough  Trelegy 1 puff daily, rinse after use  Albuterol inhaler or Xopenex neb As needed   Dupixent every 14 days as planned.  Continue on Zyrtec, Flonase and Astelin daily .  Labs today .  Follow up with Dr. Jayme Cloud in 4-6 weeks and .As needed   Please contact office for sooner follow up if symptoms do not improve or worsen or seek emergency care       Dyspnea Intermittent episodes of dyspnea over the last 4 weeks.  Questionable etiology suspect is  related to her asthma not been under good control.  Patient has had recent travel to [place].  Exam is on revealing.  Negative calf tenderness or swelling.  No tachycardia or hypoxia.  Will check D-dimer.  If positive consider CT chest to rule out underlying PE.   Check chest x-ray today.  Plan  Patient Instructions  Chest xray today.  Delsym 2 tsp .Twice daily  for cough  Tessalon Three times a day  for cough  Trelegy 1 puff daily, rinse after use  Albuterol inhaler or Xopenex neb As needed   Dupixent every 14 days as planned.  Continue on Zyrtec, Flonase and Astelin daily .  Labs today .  Follow up with Dr. Jayme Cloud in 4-6 weeks and .As needed   Please contact office for sooner follow up if symptoms do not improve or worsen or seek emergency care         Rubye Oaks, NP 05/24/2023

## 2023-05-24 NOTE — Progress Notes (Signed)
Agree with the details of the visit as noted by Tammy Parrett, NP.  C. Laura Gonzalez, MD [facility] 

## 2023-06-02 NOTE — Progress Notes (Unsigned)
    SUBJECTIVE:   CHIEF COMPLAINT / HPI:   [name] is a 57yo F w/ hx of T2DM, COPD, diabetic neuropathy that p/f diabetes management.  - After leaving last visit, she says she was feeling bad, had a headache, and tasting sugar in  her mouth. - Checked her glucose, it was 320, took a double dose of Trulicity, drank a lot of water, went to ED and glucose had improved w/ fluid.  - She was also taking a prednisone course from her Pulmonologist during that time for COPD exac, which made her sugar go up. She is no longer on the prednisone. - Recently started taking "Sugar Defense" drops under tongue, she bought this online and feels like it is helping her sugars. This appears to be herbal supplements including cinnamon. - AM fasting glucose in 90's-108 recently - Previously could not tolerate metformin.  - BG 117 now in office.    OBJECTIVE:   BP (!) 156/74   Pulse 65   Ht 5\' 1"  (1.549 m)   Wt 209 lb (94.8 kg)   LMP 05/21/2017 (Exact Date)   SpO2 98%   BMI 39.49 kg/m   General: Alert, pleasant woman. NAD. HEENT: NCAT. MMM. CV: RRR, no murmurs.  Resp: CTAB, no wheezing or crackles. Normal WOB on RA.  Abm: Soft, nontender, nondistended. BS present. Ext: Moves all ext spontaneously Skin: Warm, well perfused  Foot exam documented under Screening tab  ASSESSMENT/PLAN:   Type 2 diabetes mellitus with hyperglycemia (HCC) A1c above goal at prior visit. Had episode of hyperglycemia while on prednisone for COPD exac. Fasting BG now 90-low 100s. Tolerating trulicity well. - Increase Trulicity to 1.5mg  weekly - Provided prescription for Glargine 10u daily while on prednisone for COPD exacerbation. Advised only to only take if she is on prednisone, and to stop if she finishes the prednisone course. - Foot exam today - f/u in 3 months for A1c   - Order sent for Shingrix x2 (2-86months apart).  Kendra Brigham, MD [facility]

## 2023-06-03 ENCOUNTER — Other Ambulatory Visit: Payer: Self-pay

## 2023-06-03 ENCOUNTER — Encounter: Payer: Self-pay | Admitting: Family Medicine

## 2023-06-03 ENCOUNTER — Ambulatory Visit (INDEPENDENT_AMBULATORY_CARE_PROVIDER_SITE_OTHER): Payer: 59 | Admitting: Family Medicine

## 2023-06-03 ENCOUNTER — Other Ambulatory Visit (HOSPITAL_COMMUNITY): Payer: Self-pay

## 2023-06-03 VITALS — BP 156/74 | HR 65 | Ht 61.0 in | Wt 209.0 lb

## 2023-06-03 DIAGNOSIS — E1165 Type 2 diabetes mellitus with hyperglycemia: Secondary | ICD-10-CM

## 2023-06-03 DIAGNOSIS — Z23 Encounter for immunization: Secondary | ICD-10-CM | POA: Diagnosis not present

## 2023-06-03 MED ORDER — ZOSTER VAC RECOMB ADJUVANTED 50 MCG/0.5ML IM SUSR
0.5000 mL | Freq: Once | INTRAMUSCULAR | 1 refills | Status: AC
Start: 2023-06-03 — End: 2023-06-04
  Filled 2023-06-03: qty 0.5, 1d supply, fill #0

## 2023-06-03 MED ORDER — TRULICITY 1.5 MG/0.5ML ~~LOC~~ SOAJ
1.5000 mg | SUBCUTANEOUS | 3 refills | Status: DC
Start: 2023-06-03 — End: 2023-08-30
  Filled 2023-06-03: qty 2, 28d supply, fill #0
  Filled 2023-07-12: qty 2, 28d supply, fill #1
  Filled 2023-08-15: qty 2, 28d supply, fill #2

## 2023-06-03 MED ORDER — BASAGLAR KWIKPEN 100 UNIT/ML ~~LOC~~ SOPN
10.0000 [IU] | PEN_INJECTOR | Freq: Every day | SUBCUTANEOUS | 1 refills | Status: DC
Start: 2023-06-03 — End: 2024-03-17
  Filled 2023-06-03: qty 3, 28d supply, fill #0
  Filled 2023-08-15: qty 3, 28d supply, fill #1

## 2023-06-03 NOTE — Patient Instructions (Signed)
Good to see you today - Thank you for coming in  Things we discussed today:  1) For your diabetes,  - We will increase your Trulicity to 1.5mg  once a week.  - If you know you are going to take prednisone in the future, take 10 units of Insulin Glargine once a day while you are on the prednisone. Stop once you stop taking the prednisone.   2) I sent a prescription for you to get your Shingles vaccine. It is one dose now, and then a second dose in 2-80months.    Come back to see me in 3 months to check your A1c

## 2023-06-03 NOTE — Assessment & Plan Note (Signed)
A1c above goal at prior visit. Had episode of hyperglycemia while on prednisone for COPD exac. Fasting BG now 90-low 100s. Tolerating trulicity well. - Increase Trulicity to 1.5mg  weekly - Provided prescription for Glargine 10u daily while on prednisone for COPD exacerbation. Advised only to only take if she is on prednisone, and to stop if she finishes the prednisone course. - Foot exam today - f/u in 3 months for A1c

## 2023-06-05 DIAGNOSIS — F3289 Other specified depressive episodes: Secondary | ICD-10-CM | POA: Diagnosis not present

## 2023-06-05 DIAGNOSIS — F418 Other specified anxiety disorders: Secondary | ICD-10-CM | POA: Diagnosis not present

## 2023-06-10 ENCOUNTER — Other Ambulatory Visit (HOSPITAL_COMMUNITY): Payer: Self-pay

## 2023-06-14 ENCOUNTER — Other Ambulatory Visit (HOSPITAL_COMMUNITY): Payer: Self-pay

## 2023-06-14 ENCOUNTER — Ambulatory Visit: Payer: 59 | Admitting: Pulmonary Disease

## 2023-06-14 ENCOUNTER — Encounter: Payer: Self-pay | Admitting: Pulmonary Disease

## 2023-06-14 VITALS — BP 124/84 | HR 69 | Temp 97.9°F | Ht 61.0 in | Wt 205.2 lb

## 2023-06-14 DIAGNOSIS — R052 Subacute cough: Secondary | ICD-10-CM

## 2023-06-14 DIAGNOSIS — E669 Obesity, unspecified: Secondary | ICD-10-CM | POA: Diagnosis not present

## 2023-06-14 DIAGNOSIS — J4550 Severe persistent asthma, uncomplicated: Secondary | ICD-10-CM

## 2023-06-14 DIAGNOSIS — G4733 Obstructive sleep apnea (adult) (pediatric): Secondary | ICD-10-CM | POA: Diagnosis not present

## 2023-06-14 LAB — NITRIC OXIDE: Nitric Oxide: 13

## 2023-06-14 MED ORDER — AZITHROMYCIN 250 MG PO TABS
ORAL_TABLET | ORAL | 0 refills | Status: DC
  Filled 2023-06-14: qty 6, 5d supply, fill #0

## 2023-06-14 NOTE — Progress Notes (Unsigned)
Subjective:    Patient ID: [name], female    DOB: 11/24/1965, 57 y.o.   MRN: [id number]  Patient Care Team: Lincoln Brigham, MD as PCP - General (Family Medicine) Salena Saner, MD as Consulting Physician (Pulmonary Disease)  Chief Complaint  Patient presents with   Follow-up    SOB in the am. A little wheezing. Cough with white sputum.   HPI [name] is a 57 year old former smoker with very minimal tobacco exposure in the past who presents for follow-up on the issue of moderate to severe persistent asthma with asthmatic bronchitis, cough and chronic rhinosinusitis.  This is a scheduled visit.  She was last evaluated on 24 May 2023 by Rubye Oaks, NP as a follow-up from an ED visit.  She had had an asthma exacerbation after traveling to [place].  She had called the office and was given an Azithromycin Dosepak and a Medrol Dosepak.  The symptoms improved but she continued to have some issues with persistent cough that was dry and occasional wheezing.  She was treated symptomatically at her prior visit.  She notes that she is slowly getting better.  She has not had any fevers, chills or sweats since her prior visit.  She is due for her Dupixent injection today.  She does note that Dupixent continues to help her significantly with her breathing issues and that her exacerbations are milder on the medication.  She continues to take [employer]. She has not had any recent allergic rhinitis flares. No wheezing.  No cough, sputum production or hemoptysis. No orthopnea or paroxysmal nocturnal dyspnea.  No lower extremity edema.     She has obstructive sleep apnea chest been deemed in the past to be "mild" she is on auto CPAP at 5 to 20 cm H2O and compliant.  Notes that she cannot sleep without the device.  Review of Systems A 10 point review of systems was performed and it is as noted above otherwise negative.   Patient Active Problem List   Diagnosis Date Noted   Adjustment disorder with anxiety  01/18/2023   Malodorous urine 12/22/2022   Pruritic intertrigo 04/18/2022   Hyperlipidemia due to type 2 diabetes mellitus (HCC) 11/27/2021   Chronic mixed headache syndrome 06/05/2021   Back pain of lumbosacral region with sciatica 06/08/2020   Scar of scalp 11/13/2019   Restless leg syndrome 09/05/2017   Chronic migraine without aura with status migrainosus, not intractable 07/26/2017   Type 2 diabetes mellitus with hyperglycemia (HCC) 10/03/2015   HTN (hypertension) 10/03/2015   Fatigue 07/21/2015   Dyspnea 07/20/2013   Vaginal discharge 04/23/2012   Obstructive sleep apnea 09/08/2010   Hypercholesterolemia 09/20/2008   HOT FLASHES 09/20/2008   Depression with anxiety 12/26/2006   Asthma 12/26/2006    Social History   Tobacco Use   Smoking status: Former    Current packs/day: 0.00    Average packs/day: 0.3 packs/day for 15.0 years (3.8 ttl pk-yrs)    Types: Cigarettes    Start date: 02/16/1988    Quit date: 02/16/2003    Years since quitting: 20.3   Smokeless tobacco: Never  Substance Use Topics   Alcohol use: Yes    Alcohol/week: 3.0 standard drinks of alcohol    Types: 3 Glasses of wine per week    Allergies  Allergen Reactions   Amitriptyline     Abnormal behavior. Just doesn't feel like normal self    Fluticasone-Salmeterol Other (See Comments)    Thrush even with mouth rinsing; worsens  cough   Imitrex [Sumatriptan]     Made her head feel like its on fire   Penicillins Itching and Swelling    Has patient had a PCN reaction causing immediate rash, facial/tongue/throat swelling, SOB or lightheadedness with hypotension: No Has patient had a PCN reaction causing severe rash involving mucus membranes or skin necrosis: No Has patient had a PCN reaction that required hospitalization: No Has patient had a PCN reaction occurring within the last 10 years: Yes If all of the above answers are "NO", then may proceed with Cephalosporin use.    Ciprofloxacin Itching and  Rash   Macrobid [Nitrofurantoin Monohyd Macro] Itching   Sulfamethoxazole-Trimethoprim Rash    Current Meds  Medication Sig   albuterol (VENTOLIN HFA) 108 (90 Base) MCG/ACT inhaler Inhale 2 puffs into the lungs every 6 (six) hours as needed for wheezing or shortness of breath.   azelastine (ASTELIN) 0.1 % nasal spray Place 1 spray into both nostrils 2 (two) times daily. Use in each nostril as directed   benzonatate (TESSALON) 200 MG capsule Take 1 capsule (200 mg total) by mouth 3 (three) times daily as needed.   BIOTIN PO Take 2,000 mg by mouth daily.   blood glucose meter kit and supplies KIT Use as directed.   busPIRone (BUSPAR) 5 MG tablet Take 1.5 tablets (7.5 mg total) by mouth 2 (two) times daily.   cetirizine (ZYRTEC) 10 MG tablet Take 1 tablet (10 mg total) by mouth daily.   Dulaglutide (TRULICITY) 1.5 MG/0.5ML SOPN Inject 1.5 mg into the skin once a week.   Dupilumab (DUPIXENT) 300 MG/2ML SOPN Inject 300 mg into the skin every 14 (fourteen) days. Loading dose of 600mg  received on 11/07/21   esomeprazole (NEXIUM) 40 MG capsule Take 1 capsule (40 mg total) by mouth daily.   estradiol (ESTRACE VAGINAL) 0.1 MG/GM vaginal cream Place 1 g vaginally daily for 2 weeks, then use twice weekly thereafter.   fluconazole (DIFLUCAN) 150 MG tablet Take one pill now for 1 dose. If still having symptoms after 3 days, take a second pill.   fluorometholone (FML) 0.1 % ophthalmic suspension Place 1 drop into both eyes in the morning and at bedtime.   fluticasone (FLONASE) 50 MCG/ACT nasal spray Place 2 sprays into both nostrils daily.   Fluticasone-Umeclidin-Vilant (TRELEGY ELLIPTA) 200-62.5-25 MCG/ACT AEPB Inhale 1 puff into the lungs daily.   gabapentin (NEURONTIN) 300 MG capsule Take 1 capsule (300 mg total) by mouth every 8 (eight) hours as needed for pain   Galcanezumab-gnlm (EMGALITY) 120 MG/ML SOAJ Inject 120 mg into the skin every 30 (thirty) days.   glucose blood (FREESTYLE LITE) test strip Use  as directed 2 times daily.   Insulin Glargine (BASAGLAR KWIKPEN) 100 UNIT/ML Inject 10 Units into the skin daily ONLY while you are taking Prednisone for COPD exacerbations. Once you finish the steroid, then stop taking the insulin.   Lancets (FREESTYLE) lancets Use as directed 2 times daily.   levalbuterol (XOPENEX) 1.25 MG/3ML nebulizer solution Take 1.25 mg by nebulization every 8 (eight) hours as needed for wheezing or shortness of breath.   losartan-hydrochlorothiazide (HYZAAR) 100-25 MG tablet Take 1 tablet by mouth daily.   Multiple Vitamin (MULTIVITAMIN WITH MINERALS) TABS tablet Take 1 tablet by mouth daily.   naproxen (NAPROSYN) 500 MG tablet Take 1 tablet (500 mg total) by mouth 2 (two) times daily as needed for mild or moderate pain   nystatin (NYAMYC) powder Apply 1 application topically 3 (three) times daily.  ondansetron (ZOFRAN-ODT) 4 MG disintegrating tablet Dissolve 1 tablet under the tongue every 8 (eight) hours as needed.   oxybutynin (DITROPAN XL) 15 MG 24 hr tablet Take 1 tablet (15 mg total) by mouth at bedtime.   phenazopyridine (PYRIDIUM) 200 MG tablet Take 1 tablet (200 mg total) by mouth 3 (three) times daily as needed for pain.   Polyethyl Glycol-Propyl Glycol (SYSTANE OP) Place 1 drop into both eyes in the morning and at bedtime.   Pyridoxine HCl (VITAMIN B-6 PO) Take 1 tablet by mouth daily. Unknown OTC strength   rosuvastatin (CRESTOR) 10 MG tablet Take 1 tablet (10 mg total) by mouth daily.   sertraline (ZOLOFT) 50 MG tablet Take 1 tablet (50 mg total) by mouth daily.   Spacer/Aero-Holding Chambers (AEROCHAMBER MV) inhaler Use as instructed   Ubrogepant (UBRELVY) 100 MG TABS Take 1 tablet (100 mg) at onset of migraine as needed. If needed, a second dose may be administered at least 2 hours after the initial dose.  The maximum dose in a 24-hour period is 200 mg.   vitamin C (ASCORBIC ACID) 500 MG tablet Take 500 mg by mouth daily.     Immunization History   Administered Date(s) Administered   Influenza Split 07/22/2016   Influenza Whole 07/28/2008   Influenza,inj,Quad PF,6+ Mos 07/29/2013, 07/13/2019   Influenza-Unspecified 08/12/2014, 07/29/2015, 07/28/2017, 07/29/2020, 07/26/2021, 08/02/2022   Moderna Sars-Covid-2 Vaccination 10/21/2019, 11/09/2019   PFIZER Comirnaty(Gray Top)Covid-19 Tri-Sucrose Vaccine 07/19/2020   Pneumococcal Polysaccharide-23 08/01/2016   Td 05/29/2002   Tdap 09/05/2016        Objective:     BP 124/84 (BP Location: Left Arm, Cuff Size: Large)   Pulse 69   Temp 97.9 F (36.6 C)   Ht 5\' 1"  (1.549 m)   Wt 205 lb 3.2 oz (93.1 kg)   LMP 05/21/2017 (Exact Date)   SpO2 100%   BMI 38.77 kg/m   SpO2: 100 % O2 Device: None (Room air)  GENERAL: Obese woman, no acute distress, fully ambulatory.  No conversational dyspnea. HEAD: Normocephalic, atraumatic.  EYES: Pupils equal, round, reactive to light.  No scleral icterus.  MOUTH: Nose/mouth/throat not examined due to institutional masking requirements. NECK: Thick neck, supple. No thyromegaly. Trachea midline. No JVD.  No adenopathy. PULMONARY: Good air entry bilaterally.  No adventitious sounds. CARDIOVASCULAR: S1 and S2. Regular rate and rhythm.  No rubs, murmurs or gallops heard. ABDOMEN: Obese. MUSCULOSKELETAL: No joint deformity, no clubbing, no edema.  NEUROLOGIC: No focal deficit, no gait disturbance, speech is fluent. SKIN: Intact,warm,dry. PSYCH: Mood and behavior normal.  Lab Results  Component Value Date   NITRICOXIDE 13 06/14/2023    Assessment & Plan:     ICD-10-CM   1. Severe persistent asthma without complication  J45.50 Nitric oxide   Seems to be recovering from recent exacerbation Continue Dupixent Continue Breztri Continue as needed albuterol    2. Subacute cough  R05.2    Markedly improved after recent exacerbation Continue symptomatic management    3. OSA on CPAP  G47.33    Patient compliant Notes benefit of therapy    4.  Obesity with body mass index (BMI) of 30.0 to 39.9  E66.9    This issue adds complexity to her management She should benefit from weight loss     Orders Placed This Encounter  Procedures   Nitric oxide   Meds ordered this encounter  Medications   azithromycin (ZITHROMAX) 250 MG tablet    Sig: Take 2 tablets (500 mg) on  Day 1,  followed by 1 tablet (250 mg) once daily on Days 2 through 5.    Dispense:  6 each    Refill:  0   Overall [name] seems to be recovering from her recent exacerbation.  We have provided her with an Azithromycin Dosepak in case she notes recurrence of symptoms.  She has been instructed to let us know if she has to use that medication.  We will see her in follow-up in 6 to 8 weeks time she is to contact us prior to that time should any new difficulties arise.   Kendra Shelter, MD [facility]  [facility]    *This note was dictated using voice recognition software/Dragon.  Despite best efforts to proofread, errors can occur which can change the meaning. Any transcriptional errors that result from this process are unintentional and may not be fully corrected at the time of dictation.

## 2023-06-14 NOTE — Patient Instructions (Addendum)
Your lungs sounded clear today.  Your level of inflammation was low.  Continue your Dupixent.  Continue your other medications.  Your cough should continue to get better.  We sent a prescription for the Z-Pak to your pharmacy to have just in case your sputum starts changing color.  Let us know if you need to start using it.  We will see you in follow-up in 6 to 8 weeks time call sooner should any new problems arise.

## 2023-06-19 DIAGNOSIS — F418 Other specified anxiety disorders: Secondary | ICD-10-CM | POA: Diagnosis not present

## 2023-06-19 DIAGNOSIS — F3289 Other specified depressive episodes: Secondary | ICD-10-CM | POA: Diagnosis not present

## 2023-06-24 DIAGNOSIS — G4733 Obstructive sleep apnea (adult) (pediatric): Secondary | ICD-10-CM | POA: Diagnosis not present

## 2023-06-24 DIAGNOSIS — J45901 Unspecified asthma with (acute) exacerbation: Secondary | ICD-10-CM | POA: Diagnosis not present

## 2023-06-25 DIAGNOSIS — F3289 Other specified depressive episodes: Secondary | ICD-10-CM | POA: Diagnosis not present

## 2023-06-25 DIAGNOSIS — F418 Other specified anxiety disorders: Secondary | ICD-10-CM | POA: Diagnosis not present

## 2023-07-08 ENCOUNTER — Telehealth: Payer: Self-pay

## 2023-07-08 ENCOUNTER — Other Ambulatory Visit (HOSPITAL_COMMUNITY): Payer: Self-pay

## 2023-07-08 NOTE — Telephone Encounter (Signed)
Received a request to renew PA for Emgality-Insurance is requesting the following information-PT has not been seen since starting Emgality-Please advise-

## 2023-07-09 ENCOUNTER — Other Ambulatory Visit: Payer: Self-pay

## 2023-07-10 NOTE — Telephone Encounter (Signed)
Pharmacy Patient Advocate Encounter   Received notification from Physician's Office that prior authorization for Emgality 120MG /ML auto-injectors (migraine) is required/requested.   Insurance verification completed.   The patient is insured through [facility] .   Per test claim: PA required; PA submitted to [employer] via CoverMyMeds Key/confirmation #/EOC [id number] Status is pending

## 2023-07-11 ENCOUNTER — Other Ambulatory Visit (HOSPITAL_COMMUNITY): Payer: Self-pay

## 2023-07-11 DIAGNOSIS — F418 Other specified anxiety disorders: Secondary | ICD-10-CM | POA: Diagnosis not present

## 2023-07-11 DIAGNOSIS — F3289 Other specified depressive episodes: Secondary | ICD-10-CM | POA: Diagnosis not present

## 2023-07-11 NOTE — Telephone Encounter (Signed)
Pharmacy Patient Advocate Encounter  Received notification from [employer] that Prior Authorization for Emgality 120MG /ML auto-injectors (migraine) has been APPROVED from 07/10/2023 to 07/09/2024   PA #/Case ID/Reference #: PA# [employer] [place] [employer] [phone] [place]

## 2023-07-12 ENCOUNTER — Encounter: Payer: Self-pay | Admitting: Pharmacist

## 2023-07-12 ENCOUNTER — Other Ambulatory Visit: Payer: Self-pay | Admitting: Pulmonary Disease

## 2023-07-12 ENCOUNTER — Other Ambulatory Visit (HOSPITAL_COMMUNITY): Payer: Self-pay

## 2023-07-12 MED ORDER — BASAGLAR KWIKPEN 100 UNIT/ML ~~LOC~~ SOPN
10.0000 [IU] | PEN_INJECTOR | Freq: Every day | SUBCUTANEOUS | 1 refills | Status: DC
  Filled 2023-07-12: qty 3, 28d supply, fill #0
  Filled 2023-10-02: qty 3, 28d supply, fill #1

## 2023-07-12 MED ORDER — ALBUTEROL SULFATE HFA 108 (90 BASE) MCG/ACT IN AERS
2.0000 | INHALATION_SPRAY | Freq: Four times a day (QID) | RESPIRATORY_TRACT | 6 refills | Status: DC | PRN
  Filled 2023-07-12: qty 6.7, 25d supply, fill #0
  Filled 2023-08-16: qty 6.7, 25d supply, fill #1
  Filled 2023-10-02: qty 6.7, 25d supply, fill #2
  Filled 2023-11-12: qty 6.7, 25d supply, fill #3
  Filled 2024-01-03: qty 6.7, 25d supply, fill #4
  Filled 2024-03-10: qty 6.7, 25d supply, fill #5
  Filled 2024-04-20: qty 6.7, 25d supply, fill #6

## 2023-07-18 DIAGNOSIS — F418 Other specified anxiety disorders: Secondary | ICD-10-CM | POA: Diagnosis not present

## 2023-07-18 DIAGNOSIS — F3289 Other specified depressive episodes: Secondary | ICD-10-CM | POA: Diagnosis not present

## 2023-07-22 ENCOUNTER — Ambulatory Visit: Payer: 59 | Admitting: Family Medicine

## 2023-07-25 DIAGNOSIS — J45901 Unspecified asthma with (acute) exacerbation: Secondary | ICD-10-CM | POA: Diagnosis not present

## 2023-07-25 DIAGNOSIS — G4733 Obstructive sleep apnea (adult) (pediatric): Secondary | ICD-10-CM | POA: Diagnosis not present

## 2023-08-01 DIAGNOSIS — F418 Other specified anxiety disorders: Secondary | ICD-10-CM | POA: Diagnosis not present

## 2023-08-01 DIAGNOSIS — F3289 Other specified depressive episodes: Secondary | ICD-10-CM | POA: Diagnosis not present

## 2023-08-07 DIAGNOSIS — F418 Other specified anxiety disorders: Secondary | ICD-10-CM | POA: Diagnosis not present

## 2023-08-07 DIAGNOSIS — F3289 Other specified depressive episodes: Secondary | ICD-10-CM | POA: Diagnosis not present

## 2023-08-09 ENCOUNTER — Ambulatory Visit: Payer: 59 | Admitting: Family Medicine

## 2023-08-14 ENCOUNTER — Other Ambulatory Visit (HOSPITAL_COMMUNITY): Payer: Self-pay

## 2023-08-15 ENCOUNTER — Other Ambulatory Visit (HOSPITAL_COMMUNITY): Payer: Self-pay

## 2023-08-15 ENCOUNTER — Other Ambulatory Visit: Payer: Self-pay

## 2023-08-15 ENCOUNTER — Other Ambulatory Visit: Payer: Self-pay | Admitting: Family Medicine

## 2023-08-15 DIAGNOSIS — F419 Anxiety disorder, unspecified: Secondary | ICD-10-CM

## 2023-08-15 DIAGNOSIS — L304 Erythema intertrigo: Secondary | ICD-10-CM

## 2023-08-15 DIAGNOSIS — R102 Pelvic and perineal pain: Secondary | ICD-10-CM

## 2023-08-15 DIAGNOSIS — F418 Other specified anxiety disorders: Secondary | ICD-10-CM | POA: Diagnosis not present

## 2023-08-15 DIAGNOSIS — F3289 Other specified depressive episodes: Secondary | ICD-10-CM | POA: Diagnosis not present

## 2023-08-15 NOTE — Telephone Encounter (Signed)
Pt no longer under prescribers care  Requested Prescriptions  Refused Prescriptions Disp Refills   azelastine (ASTELIN) 0.1 % nasal spray 30 mL 12    Sig: Place 1 spray into both nostrils 2 (two) times daily. Use in each nostril as directed     There is no refill protocol information for this order

## 2023-08-16 ENCOUNTER — Telehealth: Payer: Self-pay

## 2023-08-16 ENCOUNTER — Other Ambulatory Visit: Payer: Self-pay | Admitting: Adult Health

## 2023-08-16 ENCOUNTER — Other Ambulatory Visit (HOSPITAL_COMMUNITY): Payer: Self-pay

## 2023-08-16 MED ORDER — FLUTICASONE PROPIONATE 50 MCG/ACT NA SUSP
2.0000 | Freq: Every day | NASAL | 6 refills | Status: DC
  Filled 2023-08-16: qty 16, 30d supply, fill #0
  Filled 2023-10-02: qty 16, 30d supply, fill #1
  Filled 2023-11-12: qty 16, 30d supply, fill #2

## 2023-08-16 MED ORDER — BUSPIRONE HCL 5 MG PO TABS
7.5000 mg | ORAL_TABLET | Freq: Two times a day (BID) | ORAL | 1 refills | Status: DC
Start: 2023-08-16 — End: 2023-11-12
  Filled 2023-08-16: qty 90, 30d supply, fill #0
  Filled 2023-10-02: qty 90, 30d supply, fill #1

## 2023-08-16 MED ORDER — GABAPENTIN 300 MG PO CAPS
300.0000 mg | ORAL_CAPSULE | Freq: Three times a day (TID) | ORAL | 2 refills | Status: DC
Start: 2023-08-16 — End: 2024-01-03
  Filled 2023-08-16: qty 90, 30d supply, fill #0
  Filled 2023-10-02: qty 90, 30d supply, fill #1
  Filled 2023-11-12: qty 90, 30d supply, fill #2

## 2023-08-16 MED ORDER — NYSTATIN 100000 UNIT/GM EX POWD
1.0000 | Freq: Three times a day (TID) | CUTANEOUS | 0 refills | Status: DC
  Filled 2023-08-16: qty 60, 20d supply, fill #0

## 2023-08-16 MED ORDER — ONDANSETRON 4 MG PO TBDP
4.0000 mg | ORAL_TABLET | Freq: Three times a day (TID) | ORAL | 0 refills | Status: DC | PRN
  Filled 2023-08-16: qty 18, 21d supply, fill #0
  Filled 2023-10-02: qty 2, 1d supply, fill #1

## 2023-08-16 NOTE — Telephone Encounter (Signed)
Patient calls nurse line regarding refill request for Nystatin powder.   See previous refill encounter.   Veronda Prude, RN

## 2023-08-17 ENCOUNTER — Other Ambulatory Visit: Payer: Self-pay

## 2023-08-17 MED ORDER — BENZONATATE 200 MG PO CAPS
200.0000 mg | ORAL_CAPSULE | Freq: Three times a day (TID) | ORAL | 3 refills | Status: DC | PRN
  Filled 2023-08-17: qty 45, 15d supply, fill #0
  Filled 2023-10-02: qty 45, 15d supply, fill #1
  Filled 2023-11-12: qty 45, 15d supply, fill #2

## 2023-08-19 ENCOUNTER — Other Ambulatory Visit (HOSPITAL_COMMUNITY): Payer: Self-pay

## 2023-08-30 ENCOUNTER — Other Ambulatory Visit (HOSPITAL_COMMUNITY): Payer: Self-pay

## 2023-08-30 ENCOUNTER — Other Ambulatory Visit: Payer: Self-pay

## 2023-08-30 ENCOUNTER — Encounter: Payer: Self-pay | Admitting: Family Medicine

## 2023-08-30 ENCOUNTER — Ambulatory Visit: Payer: 59 | Admitting: Family Medicine

## 2023-08-30 VITALS — BP 136/93 | HR 70 | Ht 61.0 in | Wt 201.6 lb

## 2023-08-30 DIAGNOSIS — E1165 Type 2 diabetes mellitus with hyperglycemia: Secondary | ICD-10-CM | POA: Diagnosis not present

## 2023-08-30 DIAGNOSIS — E785 Hyperlipidemia, unspecified: Secondary | ICD-10-CM

## 2023-08-30 DIAGNOSIS — I10 Essential (primary) hypertension: Secondary | ICD-10-CM | POA: Diagnosis not present

## 2023-08-30 DIAGNOSIS — Z23 Encounter for immunization: Secondary | ICD-10-CM | POA: Diagnosis not present

## 2023-08-30 DIAGNOSIS — Z794 Long term (current) use of insulin: Secondary | ICD-10-CM

## 2023-08-30 LAB — POCT GLYCOSYLATED HEMOGLOBIN (HGB A1C): HbA1c, POC (controlled diabetic range): 6.9 % (ref 0.0–7.0)

## 2023-08-30 MED ORDER — TRULICITY 3 MG/0.5ML ~~LOC~~ SOAJ
3.0000 mg | SUBCUTANEOUS | 2 refills | Status: DC
Start: 2023-08-30 — End: 2023-12-24
  Filled 2023-08-30 – 2023-10-02 (×2): qty 2, 28d supply, fill #0
  Filled 2023-11-12: qty 2, 28d supply, fill #1

## 2023-08-30 MED ORDER — ZOSTER VAC RECOMB ADJUVANTED 50 MCG/0.5ML IM SUSR
0.5000 mL | Freq: Once | INTRAMUSCULAR | 1 refills | Status: AC
Start: 2023-08-30 — End: 2023-08-30

## 2023-08-30 NOTE — Patient Instructions (Signed)
Good to see you today - Thank you for coming in  Things we discussed today:  1) Your A1c today is 6.9. This is great! The goal is to keep it under 7. - Let's increase your Trulicity to 3mg  once a week  2) We will check your cholesterol today. - If it is still high, we may increase your rosuvastatin.  3) I sent a prescription for you to get a shingles vaccine at [facility] will get 1 dose now, then a second dose in 2-6 months - This helps to prevent the painful shingles rash.   4) Your blood pressure was high today. Come back in 1 month for [place] to recheck it. If it still high then, we can increase your blood pressure meds.  Come back to see me in 1 month

## 2023-08-30 NOTE — Progress Notes (Unsigned)
    SUBJECTIVE:   CHIEF COMPLAINT / HPI:   IG is a 57yo F w/hx of T2DM and HLD that p/f T2DM management.  T2DM - Has been working on diet to help with diabetes. Trying to lose weight.  - Feels like trulicity was helpful. Denies any GI upset.  - Got a diabetic eye exam done in August  HLD - Taking rosuvastatibn consistently  HTN - Did not take BP meds yet today. Pt is hesitant to starting a new med at this time.   OBJECTIVE:   BP (!) 136/93   Pulse 70   Ht 5\' 1"  (1.549 m)   Wt 201 lb 9.6 oz (91.4 kg)   LMP 05/21/2017 (Exact Date)   SpO2 100%   BMI 38.09 kg/m   General: Alert, pleasant woman. NAD. HEENT: NCAT. MMM. CV: RRR, no murmurs.  Resp: CTAB, no wheezing or crackles. Normal WOB on RA.  Abm: Soft, nontender, nondistended. BS present. Ext: Moves all ext spontaneously Skin: Warm, well perfused   ASSESSMENT/PLAN:   Assessment & Plan Type 2 diabetes mellitus with hyperglycemia, without long-term current use of insulin (HCC) A1c, 6.9. Now at goal after starting trulicity. Tolerating trulicity well. - Increase trulicity to 3mg  weekly - f/u in 1 month Need for shingles vaccine - Precription sent for Shingrix Primary hypertension Elevated today. Most likely due to missing BP meds today.  - Advised to f/u within 1 month to recheck. Consider amlodipine if still high. Hyperlipidemia, unspecified hyperlipidemia type Lipid panel today. - Adjust rosuvastatin based on lipid panel   Lincoln Brigham, MD [facility]

## 2023-08-31 LAB — LIPID PANEL
Chol/HDL Ratio: 3.9 ratio (ref 0.0–4.4)
Cholesterol, Total: 230 mg/dL — ABNORMAL HIGH (ref 100–199)
HDL: 59 mg/dL (ref >39)
LDL Chol Calc (NIH): 141 mg/dL — ABNORMAL HIGH (ref 0–99)
Triglycerides: 168 mg/dL — ABNORMAL HIGH (ref 0–149)
VLDL Cholesterol Cal: 30 mg/dL (ref 5–40)

## 2023-09-01 NOTE — Assessment & Plan Note (Signed)
Elevated today. Most likely due to missing BP meds today.  - Advised to f/u within 1 month to recheck. Consider amlodipine if still high.

## 2023-09-01 NOTE — Assessment & Plan Note (Signed)
A1c, 6.9. Now at goal after starting trulicity. Tolerating trulicity well. - Increase trulicity to 3mg  weekly - f/u in 1 month

## 2023-09-02 DIAGNOSIS — F3289 Other specified depressive episodes: Secondary | ICD-10-CM | POA: Diagnosis not present

## 2023-09-02 DIAGNOSIS — F418 Other specified anxiety disorders: Secondary | ICD-10-CM | POA: Diagnosis not present

## 2023-09-03 ENCOUNTER — Telehealth: Payer: Self-pay | Admitting: Family Medicine

## 2023-09-03 ENCOUNTER — Other Ambulatory Visit (HOSPITAL_COMMUNITY): Payer: Self-pay

## 2023-09-03 DIAGNOSIS — E7800 Pure hypercholesterolemia, unspecified: Secondary | ICD-10-CM

## 2023-09-03 MED ORDER — ROSUVASTATIN CALCIUM 20 MG PO TABS
20.0000 mg | ORAL_TABLET | Freq: Every day | ORAL | 2 refills | Status: DC
Start: 2023-09-03 — End: 2023-11-21
  Filled 2023-09-03 – 2023-10-02 (×2): qty 30, 30d supply, fill #0
  Filled 2023-11-12: qty 30, 30d supply, fill #1

## 2023-09-03 NOTE — Telephone Encounter (Signed)
Called pt to discuss elevated cholesterol. Advised to increase crestor from 10 to 20mg  daily. Pt agreeable. Advised to f/u in 2 months to recheck lipids.

## 2023-09-10 DIAGNOSIS — F3289 Other specified depressive episodes: Secondary | ICD-10-CM | POA: Diagnosis not present

## 2023-09-10 DIAGNOSIS — F418 Other specified anxiety disorders: Secondary | ICD-10-CM | POA: Diagnosis not present

## 2023-09-11 ENCOUNTER — Other Ambulatory Visit (HOSPITAL_COMMUNITY): Payer: Self-pay

## 2023-09-13 ENCOUNTER — Other Ambulatory Visit (HOSPITAL_COMMUNITY): Payer: Self-pay

## 2023-09-18 ENCOUNTER — Ambulatory Visit: Payer: 59 | Admitting: Pulmonary Disease

## 2023-09-18 ENCOUNTER — Encounter: Payer: Self-pay | Admitting: Pulmonary Disease

## 2023-09-18 ENCOUNTER — Other Ambulatory Visit (HOSPITAL_COMMUNITY): Payer: Self-pay

## 2023-09-18 ENCOUNTER — Telehealth: Payer: Self-pay

## 2023-09-18 VITALS — BP 124/84 | HR 67 | Temp 97.3°F | Ht 61.0 in | Wt 202.4 lb

## 2023-09-18 DIAGNOSIS — J329 Chronic sinusitis, unspecified: Secondary | ICD-10-CM | POA: Diagnosis not present

## 2023-09-18 DIAGNOSIS — I10 Essential (primary) hypertension: Secondary | ICD-10-CM

## 2023-09-18 DIAGNOSIS — G4733 Obstructive sleep apnea (adult) (pediatric): Secondary | ICD-10-CM

## 2023-09-18 DIAGNOSIS — R058 Other specified cough: Secondary | ICD-10-CM

## 2023-09-18 DIAGNOSIS — K219 Gastro-esophageal reflux disease without esophagitis: Secondary | ICD-10-CM

## 2023-09-18 DIAGNOSIS — J4550 Severe persistent asthma, uncomplicated: Secondary | ICD-10-CM

## 2023-09-18 DIAGNOSIS — J4540 Moderate persistent asthma, uncomplicated: Secondary | ICD-10-CM

## 2023-09-18 DIAGNOSIS — E669 Obesity, unspecified: Secondary | ICD-10-CM

## 2023-09-18 MED ORDER — CETIRIZINE HCL 10 MG PO TABS
10.0000 mg | ORAL_TABLET | Freq: Every day | ORAL | 11 refills | Status: DC
  Filled 2023-09-18: qty 100, 100d supply, fill #0
  Filled 2024-01-03: qty 30, 30d supply, fill #0

## 2023-09-18 NOTE — Patient Instructions (Signed)
VISIT SUMMARY:  During today's visit, we discussed your worsening cough, which has been severe enough to cause headaches over the past three days. We reviewed your current medications and noted the need for a refill of Dupixent. We also addressed your chronic rhinosinusitis, obstructive sleep apnea, gastroesophageal reflux disease (GERD), and hypertension. Your treatment plan has been adjusted to help manage these conditions more effectively.  YOUR PLAN:  -SEVERE PERSISTENT ASTHMA: Severe persistent asthma is a long-term condition where the airways in the lungs are always inflamed and can become even more swollen and narrow when something triggers the symptoms. We will refill your Dupixent prescription, continue your use of Trelegy Ellipta, and prescribe Zyrtec to help with any allergic flare-ups. Please continue using your rescue inhaler as needed, especially at work. Follow-up in four months.  -CHRONIC RHINOSINUSITIS: Chronic rhinosinusitis is a condition where the cavities around the nasal passages (sinuses) become inflamed and swollen for at least 12 weeks, despite treatment attempts. We will prescribe Zyrtec to help manage your symptoms, which may be worsened by seasonal changes and mold exposure.  -OBSTRUCTIVE SLEEP APNEA: Obstructive sleep apnea is a condition where the muscles in the throat relax too much during sleep, temporarily blocking the airway. You are compliant with your CPAP therapy, but we acknowledge your dissatisfaction with the new mask. Please continue using your CPAP machine regularly.  -GASTROESOPHAGEAL REFLUX DISEASE (GERD): GERD is a chronic condition where stomach acid frequently flows back into the tube connecting your mouth and stomach (esophagus). Continue taking your current acid reflux medication daily to manage this condition.  -HYPERTENSION: Hypertension, or high blood pressure, is a condition where the force of the blood against your artery walls is too high. Continue  taking your current blood pressure medication daily to keep your blood pressure under control.  INSTRUCTIONS:  Please follow up in four months to reassess your conditions and treatment plan. Make sure to refill your Dupixent prescription and start taking Zyrtec as prescribed. Continue using your CPAP machine and current medications for GERD and hypertension.

## 2023-09-18 NOTE — Progress Notes (Signed)
Subjective:    Patient ID: [name], female    DOB: August 04, 1966, 57 y.o.   MRN: [id number]  Patient Care Team: Lincoln Brigham, MD as PCP - General (Family Medicine) Salena Saner, MD as Consulting Physician (Pulmonary Disease)  Chief Complaint  Patient presents with   Follow-up    DOE. Cough with white sputum in the last 3 days. No wheezing.  CPAP-     BACKGROUND/INTERVAL:da is a 57 year old former smoker with very minimal tobacco exposure in the past who presents for follow-up on the issue of moderate to severe persistent asthma with asthmatic bronchitis, cough and chronic rhinosinusitis.  This is a scheduled visit.  She was last evaluated on 14 June 2023.    HPI Discussed the use of AI scribe software for clinical note transcription with the patient, who gave verbal consent to proceed.  History of Present Illness   The patient, with a history of severe persistent asthma, asthmatic bronchitis, chronic rhinosinusitis, and obstructive sleep apnea, presents with a worsening cough over the past three days. The cough is described as severe and persistent, leading to headaches. The patient reports using prescribed medication from her neurologist to manage these headaches. The cough is likened to symptoms of a cold and is particularly severe at night, causing significant discomfort.  Despite daily use of acid reflux medication and blood pressure medication, the cough persists. The patient is also on Dupixent for asthma management and Trelegy Ellipta, but notes the need for a refill of Dupixent. The patient uses a rescue inhaler twice daily, once in the morning and once at night, to manage shortness of breath and prevent excessive coughing.  The patient also reports using Zyrtec but does not have a current prescription. She has a Z-Pak on hand for use if symptoms worsen. The patient uses a CPAP machine for obstructive sleep apnea and reports no issues with its use. However, she expresses  dissatisfaction with a new mask that only covers the nose, preferring her previous full-face mask. Discussed need for better compliance with device.     Review of Systems A 10 point review of systems was performed and it is as noted above otherwise negative.   Patient Active Problem List   Diagnosis Date Noted   Adjustment disorder with anxiety 01/18/2023   Malodorous urine 12/22/2022   Pruritic intertrigo 04/18/2022   Hyperlipidemia due to type 2 diabetes mellitus (HCC) 11/27/2021   Chronic mixed headache syndrome 06/05/2021   Back pain of lumbosacral region with sciatica 06/08/2020   Scar of scalp 11/13/2019   Restless leg syndrome 09/05/2017   Chronic migraine without aura with status migrainosus, not intractable 07/26/2017   Type 2 diabetes mellitus with hyperglycemia (HCC) 10/03/2015   HTN (hypertension) 10/03/2015   Fatigue 07/21/2015   Dyspnea 07/20/2013   Vaginal discharge 04/23/2012   Obstructive sleep apnea 09/08/2010   Hypercholesterolemia 09/20/2008   HOT FLASHES 09/20/2008   Depression with anxiety 12/26/2006   Asthma 12/26/2006    Social History   Tobacco Use   Smoking status: Former    Current packs/day: 0.00    Average packs/day: 0.3 packs/day for 15.0 years (3.8 ttl pk-yrs)    Types: Cigarettes    Start date: 02/16/1988    Quit date: 02/16/2003    Years since quitting: 20.6   Smokeless tobacco: Never  Substance Use Topics   Alcohol use: Yes    Alcohol/week: 3.0 standard drinks of alcohol    Types: 3 Glasses of wine per week  Allergies  Allergen Reactions   Amitriptyline     Abnormal behavior. Just doesn't feel like normal self    Fluticasone-Salmeterol Other (See Comments)    Thrush even with mouth rinsing; worsens cough   Imitrex [Sumatriptan]     Made her head feel like its on fire   Penicillins Itching and Swelling    Has patient had a PCN reaction causing immediate rash, facial/tongue/throat swelling, SOB or lightheadedness with hypotension:  No Has patient had a PCN reaction causing severe rash involving mucus membranes or skin necrosis: No Has patient had a PCN reaction that required hospitalization: No Has patient had a PCN reaction occurring within the last 10 years: Yes If all of the above answers are "NO", then may proceed with Cephalosporin use.    Ciprofloxacin Itching and Rash   Macrobid [Nitrofurantoin Monohyd Macro] Itching   Sulfamethoxazole-Trimethoprim Rash    Current Meds  Medication Sig   albuterol (VENTOLIN HFA) 108 (90 Base) MCG/ACT inhaler Inhale 2 puffs into the lungs every 6 (six) hours as needed for wheezing or shortness of breath.   azelastine (ASTELIN) 0.1 % nasal spray Place 1 spray into both nostrils 2 (two) times daily. Use in each nostril as directed   benzonatate (TESSALON) 200 MG capsule Take 1 capsule (200 mg total) by mouth 3 (three) times daily as needed.   BIOTIN PO Take 2,000 mg by mouth daily.   blood glucose meter kit and supplies KIT Use as directed.   busPIRone (BUSPAR) 5 MG tablet Take 1.5 tablets (7.5 mg total) by mouth 2 (two) times daily.   cetirizine (ZYRTEC) 10 MG tablet Take 1 tablet (10 mg total) by mouth daily.   Dulaglutide (TRULICITY) 3 MG/0.5ML SOAJ Inject 3 mg as directed once a week.   Dupilumab (DUPIXENT) 300 MG/2ML SOPN Inject 300 mg into the skin every 14 (fourteen) days. Loading dose of 600mg  received on 11/07/21   esomeprazole (NEXIUM) 40 MG capsule Take 1 capsule (40 mg total) by mouth daily.   estradiol (ESTRACE VAGINAL) 0.1 MG/GM vaginal cream Place 1 g vaginally daily for 2 weeks, then use twice weekly thereafter.   fluorometholone (FML) 0.1 % ophthalmic suspension Place 1 drop into both eyes in the morning and at bedtime.   fluticasone (FLONASE) 50 MCG/ACT nasal spray Place 2 sprays into both nostrils daily.   Fluticasone-Umeclidin-Vilant (TRELEGY ELLIPTA) 200-62.5-25 MCG/ACT AEPB Inhale 1 puff into the lungs daily.   gabapentin (NEURONTIN) 300 MG capsule Take 1  capsule (300 mg total) by mouth every 8 (eight) hours as needed for pain   Galcanezumab-gnlm (EMGALITY) 120 MG/ML SOAJ Inject 120 mg into the skin every 30 (thirty) days.   glucose blood (FREESTYLE LITE) test strip Use as directed 2 times daily.   Insulin Glargine (BASAGLAR KWIKPEN) 100 UNIT/ML Inject 10 Units into the skin daily ONLY while you are taking Prednisone for COPD exacerbations. Once you finish the steroid, then stop taking the insulin.   Insulin Glargine (BASAGLAR KWIKPEN) 100 UNIT/ML Inject 10 Units into the skin daily only while you are taking Prednisone for COPD exacerbations. Once you finish the steroid, then stop taking the insulin   Lancets (FREESTYLE) lancets Use as directed 2 times daily.   levalbuterol (XOPENEX) 1.25 MG/3ML nebulizer solution Take 1.25 mg by nebulization every 8 (eight) hours as needed for wheezing or shortness of breath.   losartan-hydrochlorothiazide (HYZAAR) 100-25 MG tablet Take 1 tablet by mouth daily.   Multiple Vitamin (MULTIVITAMIN WITH MINERALS) TABS tablet Take 1 tablet by  mouth daily.   naproxen (NAPROSYN) 500 MG tablet Take 1 tablet (500 mg total) by mouth 2 (two) times daily as needed for mild or moderate pain   nystatin (NYAMYC) powder Apply 1 application topically 3 (three) times daily.   ondansetron (ZOFRAN-ODT) 4 MG disintegrating tablet Dissolve 1 tablet under the tongue every 8 (eight) hours as needed.   oxybutynin (DITROPAN XL) 15 MG 24 hr tablet Take 1 tablet (15 mg total) by mouth at bedtime.   phenazopyridine (PYRIDIUM) 200 MG tablet Take 1 tablet (200 mg total) by mouth 3 (three) times daily as needed for pain.   Polyethyl Glycol-Propyl Glycol (SYSTANE OP) Place 1 drop into both eyes in the morning and at bedtime.   Pyridoxine HCl (VITAMIN B-6 PO) Take 1 tablet by mouth daily. Unknown OTC strength   rosuvastatin (CRESTOR) 20 MG tablet Take 1 tablet (20 mg total) by mouth daily.   sertraline (ZOLOFT) 50 MG tablet Take 1 tablet (50 mg  total) by mouth daily.   Spacer/Aero-Holding Chambers (AEROCHAMBER MV) inhaler Use as instructed   Ubrogepant (UBRELVY) 100 MG TABS Take 1 tablet (100 mg) at onset of migraine as needed. If needed, a second dose may be administered at least 2 hours after the initial dose.  The maximum dose in a 24-hour period is 200 mg.   vitamin C (ASCORBIC ACID) 500 MG tablet Take 500 mg by mouth daily.     Immunization History  Administered Date(s) Administered   Influenza Split 07/22/2016   Influenza Whole 07/28/2008   Influenza,inj,Quad PF,6+ Mos 07/29/2013, 07/13/2019   Influenza-Unspecified 08/12/2014, 07/29/2015, 07/28/2017, 07/29/2020, 07/26/2021, 08/02/2022   Moderna Sars-Covid-2 Vaccination 10/21/2019, 11/09/2019   PFIZER Comirnaty(Gray Top)Covid-19 Tri-Sucrose Vaccine 07/19/2020   Pneumococcal Polysaccharide-23 08/01/2016   Td 05/29/2002   Tdap 09/05/2016       Objective:     BP 124/84 (BP Location: Right Arm, Cuff Size: Normal)   Pulse 67   Temp (!) 97.3 F (36.3 C)   Ht 5\' 1"  (1.549 m)   Wt 202 lb 6.4 oz (91.8 kg)   LMP 05/21/2017 (Exact Date)   SpO2 98%   BMI 38.24 kg/m   SpO2: 98 % O2 Device: None (Room air)  GENERAL: Obese woman, no acute distress, fully ambulatory.  No conversational dyspnea. HEAD: Normocephalic, atraumatic.  EYES: Pupils equal, round, reactive to light.  No scleral icterus.  MOUTH: Dentition intact, oral mucosa moist. NECK: Thick neck, supple. No thyromegaly. Trachea midline. No JVD.  No adenopathy. PULMONARY: Good air entry bilaterally.  No adventitious sounds. CARDIOVASCULAR: S1 and S2. Regular rate and rhythm.  No rubs, murmurs or gallops heard. ABDOMEN: Obese. MUSCULOSKELETAL: No joint deformity, no clubbing, no edema.  NEUROLOGIC: No focal deficit, no gait disturbance, speech is fluent. SKIN: Intact,warm,dry. PSYCH: Mood and behavior normal.   Assessment & Plan:     ICD-10-CM   1. Severe persistent asthma without complication  J45.50      2. Chronic rhinosinusitis  J32.9     3. Upper airway cough syndrome  R05.8     4. Obesity with body mass index (BMI) of 30.0 to 39.9  E66.9     5. OSA on CPAP  G47.33      Meds ordered this encounter  Medications   cetirizine (ZYRTEC) 10 MG tablet    Sig: Take 1 tablet (10 mg total) by mouth daily.    Dispense:  30 tablet    Refill:  11   Assessment and Plan    Severe  Persistent Asthma Worsening cough over the past three days, severe enough to cause headaches. Currently on Dupixent and Trelegy Ellipta. Uses a rescue inhaler twice daily, especially at work. Cough may be exacerbated by seasonal allergies. Dupixent refill needed. - Refill Dupixent prescription - Continue Trelegy Ellipta - Prescribe Zyrtec for allergic flare-ups - Follow-up in four months  Chronic Rhinosinusitis Uses Zyrtec for symptoms but lacks a prescription. Symptoms may be exacerbated by seasonal changes and mold exposure. - Prescribe Zyrtec (OTC but patient requests Rx.)  Obstructive Sleep Apnea Compliant with CPAP therapy but received a less effective mask. Continues regular CPAP use. - Continue CPAP therapy - Review of compliance download shows poor compliance due to mask fit - Reiterated need for use > 4 hours per night, consistently  Gastroesophageal Reflux Disease (GERD) Takes acid reflux medication daily, indicating controlled GERD. - Continue current acid reflux medication  Hypertension Takes blood pressure medication daily, indicating controlled hypertension. - Continue current blood pressure medication -Not on ACE I  Follow-up - Follow-up in four months.     Gailen Shelter, MD Advanced Bronchoscopy PCCM [facility]    *This note was generated using voice recognition software/Dragon and/or AI transcription program.  Despite best efforts to proofread, errors can occur which can change the meaning. Any transcriptional errors that result from this process are  unintentional and may not be fully corrected at the time of dictation.

## 2023-09-18 NOTE — Telephone Encounter (Signed)
Patient was seen in the office today. She said she thinks she needs a refill on her Dupixent.Can you help with this? Thank you!

## 2023-09-19 DIAGNOSIS — F3289 Other specified depressive episodes: Secondary | ICD-10-CM | POA: Diagnosis not present

## 2023-09-19 DIAGNOSIS — F418 Other specified anxiety disorders: Secondary | ICD-10-CM | POA: Diagnosis not present

## 2023-09-20 MED ORDER — DUPIXENT 300 MG/2ML ~~LOC~~ SOAJ: 300.0000 mg | SUBCUTANEOUS | 1 refills | Status: DC

## 2023-09-20 NOTE — Telephone Encounter (Signed)
Refill for Dupixent sent to [facility] today. MyChart message sent to patient for update.

## 2023-09-23 DIAGNOSIS — G4733 Obstructive sleep apnea (adult) (pediatric): Secondary | ICD-10-CM | POA: Diagnosis not present

## 2023-09-23 DIAGNOSIS — J45901 Unspecified asthma with (acute) exacerbation: Secondary | ICD-10-CM | POA: Diagnosis not present

## 2023-09-30 ENCOUNTER — Other Ambulatory Visit (HOSPITAL_COMMUNITY): Payer: Self-pay

## 2023-10-02 ENCOUNTER — Other Ambulatory Visit (HOSPITAL_COMMUNITY): Payer: Self-pay

## 2023-10-02 ENCOUNTER — Other Ambulatory Visit: Payer: Self-pay | Admitting: Family Medicine

## 2023-10-02 ENCOUNTER — Other Ambulatory Visit: Payer: Self-pay | Admitting: Obstetrics and Gynecology

## 2023-10-02 DIAGNOSIS — F3289 Other specified depressive episodes: Secondary | ICD-10-CM | POA: Diagnosis not present

## 2023-10-02 DIAGNOSIS — N3281 Overactive bladder: Secondary | ICD-10-CM

## 2023-10-02 DIAGNOSIS — F418 Other specified anxiety disorders: Secondary | ICD-10-CM | POA: Diagnosis not present

## 2023-10-02 DIAGNOSIS — L304 Erythema intertrigo: Secondary | ICD-10-CM

## 2023-10-02 DIAGNOSIS — N898 Other specified noninflammatory disorders of vagina: Secondary | ICD-10-CM

## 2023-10-07 ENCOUNTER — Other Ambulatory Visit (HOSPITAL_COMMUNITY): Payer: Self-pay

## 2023-10-07 MED ORDER — NYSTATIN 100000 UNIT/GM EX POWD
1.0000 | Freq: Three times a day (TID) | CUTANEOUS | 5 refills | Status: DC
  Filled 2023-10-07: qty 60, 10d supply, fill #0
  Filled 2023-11-12: qty 60, 10d supply, fill #1
  Filled 2024-01-03: qty 60, 10d supply, fill #2
  Filled 2024-03-10: qty 60, 10d supply, fill #3
  Filled 2024-04-20: qty 60, 10d supply, fill #4
  Filled 2024-06-02: qty 60, 10d supply, fill #5

## 2023-10-08 ENCOUNTER — Other Ambulatory Visit (HOSPITAL_COMMUNITY): Payer: Self-pay

## 2023-10-15 ENCOUNTER — Other Ambulatory Visit (HOSPITAL_COMMUNITY): Payer: Self-pay

## 2023-10-23 DIAGNOSIS — J45901 Unspecified asthma with (acute) exacerbation: Secondary | ICD-10-CM | POA: Diagnosis not present

## 2023-10-23 DIAGNOSIS — G4733 Obstructive sleep apnea (adult) (pediatric): Secondary | ICD-10-CM | POA: Diagnosis not present

## 2023-11-05 ENCOUNTER — Other Ambulatory Visit (HOSPITAL_COMMUNITY): Payer: Self-pay

## 2023-11-05 MED ORDER — CEPHALEXIN 500 MG PO CAPS
500.0000 mg | ORAL_CAPSULE | Freq: Two times a day (BID) | ORAL | 0 refills | Status: AC
  Filled 2023-11-05: qty 14, 7d supply, fill #0

## 2023-11-12 ENCOUNTER — Other Ambulatory Visit: Payer: Self-pay | Admitting: Family Medicine

## 2023-11-12 ENCOUNTER — Other Ambulatory Visit (HOSPITAL_COMMUNITY): Payer: Self-pay

## 2023-11-12 ENCOUNTER — Telehealth: Payer: Self-pay | Admitting: Pulmonary Disease

## 2023-11-12 DIAGNOSIS — J4540 Moderate persistent asthma, uncomplicated: Secondary | ICD-10-CM

## 2023-11-12 DIAGNOSIS — F419 Anxiety disorder, unspecified: Secondary | ICD-10-CM

## 2023-11-12 DIAGNOSIS — E1165 Type 2 diabetes mellitus with hyperglycemia: Secondary | ICD-10-CM

## 2023-11-12 DIAGNOSIS — K219 Gastro-esophageal reflux disease without esophagitis: Secondary | ICD-10-CM

## 2023-11-12 MED ORDER — BUSPIRONE HCL 5 MG PO TABS
7.5000 mg | ORAL_TABLET | Freq: Two times a day (BID) | ORAL | 3 refills | Status: DC
  Filled 2023-11-12: qty 90, 30d supply, fill #0
  Filled 2024-03-10: qty 90, 30d supply, fill #1
  Filled 2024-04-20: qty 90, 30d supply, fill #2
  Filled 2024-06-02: qty 90, 30d supply, fill #3
  Filled 2024-07-16: qty 90, 30d supply, fill #4
  Filled 2024-08-25: qty 90, 30d supply, fill #5
  Filled 2024-09-29: qty 90, 30d supply, fill #6

## 2023-11-12 MED ORDER — ESOMEPRAZOLE MAGNESIUM 40 MG PO CPDR
40.0000 mg | DELAYED_RELEASE_CAPSULE | Freq: Every day | ORAL | 3 refills | Status: DC
  Filled 2023-11-12 – 2024-01-03 (×2): qty 30, 30d supply, fill #0
  Filled 2024-03-10: qty 30, 30d supply, fill #1
  Filled 2024-04-20: qty 30, 30d supply, fill #2
  Filled 2024-06-02: qty 30, 30d supply, fill #3
  Filled 2024-07-16: qty 30, 30d supply, fill #4
  Filled 2024-08-25: qty 30, 30d supply, fill #5
  Filled 2024-09-29: qty 30, 30d supply, fill #6

## 2023-11-12 MED FILL — Insulin Glargine Soln Pen-Injector 100 Unit/ML: SUBCUTANEOUS | 30 days supply | Qty: 3 | Fill #0 | Status: CN

## 2023-11-12 NOTE — Telephone Encounter (Signed)
 Patient needs pre auth for Dupixent to be called in for a refill. Phone number for [facility] [phone]  fax number: [phone]

## 2023-11-13 ENCOUNTER — Other Ambulatory Visit (HOSPITAL_COMMUNITY): Payer: Self-pay

## 2023-11-13 MED ORDER — DUPIXENT 300 MG/2ML ~~LOC~~ SOAJ: 300.0000 mg | SUBCUTANEOUS | 1 refills | Status: DC

## 2023-11-13 MED FILL — Insulin Glargine Soln Pen-Injector 100 Unit/ML: SUBCUTANEOUS | 30 days supply | Qty: 3 | Fill #0 | Status: AC

## 2023-11-13 NOTE — Telephone Encounter (Signed)
 Submitted a Prior Authorization request to  [employer]  for DUPIXENT  via CoverMyMeds. Will update once we receive a response.  Key: [id number]

## 2023-11-14 ENCOUNTER — Telehealth: Payer: Self-pay | Admitting: Pharmacist

## 2023-11-14 ENCOUNTER — Other Ambulatory Visit (HOSPITAL_COMMUNITY): Payer: Self-pay

## 2023-11-14 NOTE — Telephone Encounter (Signed)
Received notification from  [employer]  regarding a prior authorization for DUPIXENT. Authorization has been APPROVED from 11/14/2023 to 05/13/2024. Approval letter sent to scan center.  Patient can continue to fill through [facility]: [phone]  Phone # [phone]  Called patient to advise of approval  Chesley Mires, PharmD, MPH, BCPS, CPP Clinical Pharmacist (Rheumatology and Pulmonology)

## 2023-11-14 NOTE — Telephone Encounter (Signed)
Patient called stating Trelegy requires PA. Can yu please confirm if PA is required? If so, can you please submit. Patient fills with [facility] pharmacy I believe  I did go ahead and help enroll patient into Trelegy copay card  Chesley Mires, PharmD, MPH, BCPS, CPP Clinical Pharmacist (Rheumatology and Pulmonology)

## 2023-11-18 ENCOUNTER — Other Ambulatory Visit (HOSPITAL_COMMUNITY): Payer: Self-pay

## 2023-11-18 ENCOUNTER — Telehealth: Payer: Self-pay

## 2023-11-18 NOTE — Telephone Encounter (Signed)
Pharmacy Patient Advocate Encounter   Received notification from Pt Calls Messages that prior authorization for Trelegy Ellipta 200-62.5-25MCG/ACT aerosol powder is required/requested.   Insurance verification completed.   The patient is insured through [facility] .   Per test claim: The current 30 day co-pay is, $654.06 **.  No PA needed at this time. This test claim was processed through [facility] copay amounts may vary at other pharmacies due to pharmacy/plan contracts, or as the patient moves through the different stages of their insurance plan.     **Manufacturer's coupon on patient file. Applied with insurance, co-pay is $9.06

## 2023-11-20 ENCOUNTER — Telehealth: Payer: Self-pay | Admitting: Family Medicine

## 2023-11-20 NOTE — Telephone Encounter (Signed)
Pt states a PA is needed for Galcanezumab-gnlm (EMGALITY) 120 MG/ML SOAJ and Ubrogepant (UBRELVY) 100 MG TABS per pharmacy. Can submit via phone [phone]

## 2023-11-21 ENCOUNTER — Other Ambulatory Visit (HOSPITAL_COMMUNITY): Payer: Self-pay

## 2023-11-21 ENCOUNTER — Other Ambulatory Visit: Payer: Self-pay

## 2023-11-21 DIAGNOSIS — I10 Essential (primary) hypertension: Secondary | ICD-10-CM

## 2023-11-21 DIAGNOSIS — F418 Other specified anxiety disorders: Secondary | ICD-10-CM

## 2023-11-21 DIAGNOSIS — E7800 Pure hypercholesterolemia, unspecified: Secondary | ICD-10-CM

## 2023-11-21 DIAGNOSIS — R350 Frequency of micturition: Secondary | ICD-10-CM

## 2023-11-21 MED ORDER — LOSARTAN POTASSIUM-HCTZ 100-25 MG PO TABS
1.0000 | ORAL_TABLET | Freq: Every day | ORAL | 1 refills | Status: DC
  Filled 2023-11-21 – 2024-01-03 (×2): qty 90, 90d supply, fill #0
  Filled 2024-03-10: qty 90, 90d supply, fill #1

## 2023-11-21 MED ORDER — OXYBUTYNIN CHLORIDE ER 15 MG PO TB24
15.0000 mg | ORAL_TABLET | Freq: Every day | ORAL | 1 refills | Status: DC
  Filled 2023-11-21: qty 90, 90d supply, fill #0
  Filled 2024-03-10: qty 90, 90d supply, fill #1

## 2023-11-21 MED ORDER — ROSUVASTATIN CALCIUM 20 MG PO TABS
20.0000 mg | ORAL_TABLET | Freq: Every day | ORAL | 2 refills | Status: DC
  Filled 2023-11-21: qty 30, 30d supply, fill #0

## 2023-11-21 MED ORDER — SERTRALINE HCL 50 MG PO TABS
50.0000 mg | ORAL_TABLET | Freq: Every day | ORAL | 3 refills | Status: DC
  Filled 2023-11-21 – 2024-01-03 (×2): qty 90, 90d supply, fill #0
  Filled 2024-03-10 – 2024-04-20 (×4): qty 90, 90d supply, fill #1
  Filled 2024-06-02 – 2024-07-16 (×2): qty 90, 90d supply, fill #2
  Filled 2024-09-29 – 2024-10-02 (×2): qty 90, 90d supply, fill #3

## 2023-11-22 ENCOUNTER — Other Ambulatory Visit (HOSPITAL_COMMUNITY): Payer: Self-pay

## 2023-11-25 ENCOUNTER — Telehealth: Payer: Self-pay

## 2023-11-25 ENCOUNTER — Other Ambulatory Visit (HOSPITAL_COMMUNITY): Payer: Self-pay

## 2023-11-25 NOTE — Telephone Encounter (Signed)
Pharmacy Patient Advocate Encounter   Received notification from Physician's Office that prior authorization for Emgality 120MG /ML auto-injectors (migraine) is required/requested.   Insurance verification completed.   The patient is insured through  [employer]  .   Per test claim: PA required; PA submitted to above mentioned insurance via CoverMyMeds Key/confirmation #/EOC [id number] Status is pending

## 2023-11-25 NOTE — Telephone Encounter (Signed)
PA request has been Submitted. New Encounter created for follow up. For additional info see Pharmacy Prior Auth telephone encounter from 11/25/2023.

## 2023-11-25 NOTE — Telephone Encounter (Signed)
Pharmacy Patient Advocate Encounter   Received notification from Physician's Office that prior authorization for Ubrelvy 100MG  tablets is required/requested.   Insurance verification completed.   The patient is insured through  [employer]  .   Per test claim: PA required; PA submitted to above mentioned insurance via CoverMyMeds Key/confirmation #/EOC [facility] Status is pending

## 2023-11-26 ENCOUNTER — Other Ambulatory Visit (HOSPITAL_COMMUNITY): Payer: Self-pay

## 2023-11-26 NOTE — Telephone Encounter (Signed)
Called [facility] pharmacy rx p/u on 11/13/23.

## 2023-11-27 ENCOUNTER — Telehealth: Payer: Self-pay | Admitting: Pulmonary Disease

## 2023-11-27 NOTE — Telephone Encounter (Signed)
Specialty pharmacy needs a new rx sent for dupixent phone # is [phone]  fax is [phone]

## 2023-11-27 NOTE — Telephone Encounter (Signed)
Refill for Dupixent was sent on 11/13/2023 to [facility]  Called patient - she states shehas not received. Advised her to call pharmacy to follow-up and aslo follow-up about Trelegy prescription w [facility]  She verbalizedunderstanding  Chesley Mires, PharmD, MPH, BCPS, CPP Clinical Pharmacist (Rheumatology and Pulmonology)

## 2023-11-28 ENCOUNTER — Ambulatory Visit (INDEPENDENT_AMBULATORY_CARE_PROVIDER_SITE_OTHER): Payer: Medicaid Other | Admitting: Family Medicine

## 2023-11-28 ENCOUNTER — Other Ambulatory Visit (HOSPITAL_COMMUNITY): Payer: Self-pay

## 2023-11-28 ENCOUNTER — Encounter: Payer: Self-pay | Admitting: Family Medicine

## 2023-11-28 VITALS — BP 128/78 | HR 76 | Ht 61.0 in | Wt 198.0 lb

## 2023-11-28 DIAGNOSIS — E785 Hyperlipidemia, unspecified: Secondary | ICD-10-CM

## 2023-11-28 DIAGNOSIS — E1165 Type 2 diabetes mellitus with hyperglycemia: Secondary | ICD-10-CM

## 2023-11-28 DIAGNOSIS — E1169 Type 2 diabetes mellitus with other specified complication: Secondary | ICD-10-CM

## 2023-11-28 DIAGNOSIS — M5440 Lumbago with sciatica, unspecified side: Secondary | ICD-10-CM

## 2023-11-28 DIAGNOSIS — R21 Rash and other nonspecific skin eruption: Secondary | ICD-10-CM

## 2023-11-28 LAB — POCT GLYCOSYLATED HEMOGLOBIN (HGB A1C): HbA1c, POC (controlled diabetic range): 6.6 % (ref 0.0–7.0)

## 2023-11-28 MED ORDER — NAPROXEN 500 MG PO TABS
500.0000 mg | ORAL_TABLET | Freq: Two times a day (BID) | ORAL | 2 refills | Status: AC | PRN
  Filled 2023-11-28 (×2): qty 60, 30d supply, fill #0
  Filled 2024-01-03: qty 60, 30d supply, fill #1
  Filled 2024-11-11: qty 60, 30d supply, fill #2

## 2023-11-28 NOTE — Telephone Encounter (Signed)
I just wanted to double check about these denials. She takes [employer] as preventative for migraines and Ubrelvy as abortive therapy. I see both got denied. Were the PA's submitted stating this difference? One is used for prevention and one for abortive therapy.

## 2023-11-28 NOTE — Progress Notes (Deleted)
    SUBJECTIVE:   CHIEF COMPLAINT / HPI:   ***     - Was having trouble getting meds filled since December due to insurance issues.  - Meds should be at pharmacy now and pt plans to go pick up her meds.      - Has been off her BP meds, nexium (she had to buy it over the counter).    Rash - has been out of nystatin powder, she had to    BL LE Pain - Takes gabapentin for this, but feels like this is not enough.  - Also tried to soak in magnesium.  - Has also been taking naproxen. This helped and allowed her to sleep.  - Has been using naproxen only as needed. Not using this daily.  - Also taking tylenol - Not currently used to      PERTINENT  PMH / PSH: ***  OBJECTIVE:   BP 128/78   Pulse 76   Ht 5\' 1"  (1.549 m)   Wt 198 lb (89.8 kg)   LMP 05/21/2017 (Exact Date)   SpO2 98%   BMI 37.41 kg/m   ***  ASSESSMENT/PLAN:   No problem-specific Assessment & Plan notes found for this encounter.     Lincoln Brigham, MD [facility]

## 2023-11-28 NOTE — Telephone Encounter (Signed)
Pharmacy Patient Advocate Encounter  Received notification from [facility] that Prior Authorization for Ubrelvy 100mg  has been DENIED.  Full denial letter will be uploaded to the media tab. See denial reason below.   PA #/Case ID/Reference #: PA Case ID #: [id number]

## 2023-11-28 NOTE — Progress Notes (Signed)
    SUBJECTIVE:   CHIEF COMPLAINT / HPI:   [name] is a 58yo F w/ hx of T2DM, HLD, HTN p/f T2DM f/u. - Was having trouble getting meds filled since December due to insurance issues.  - Meds should be at pharmacy now and pt plans to go pick up her meds.  - Has been off her BP meds, nexium (she had to buy it over the counter).   Rash - has been out of nystatin powder, she had to   BL LE Pain - Takes gabapentin for this, but feels like this is not enough.  - Also tried to soak in magnesium.  - Has also been taking naproxen. This helped and allowed her to sleep.  - Has been using naproxen only as needed. Not using this daily.  - Also taking tylenol - Not currently interested in physical therapy  OBJECTIVE:   BP 128/78   Pulse 76   Ht 5\' 1"  (1.549 m)   Wt 198 lb (89.8 kg)   LMP 05/21/2017 (Exact Date)   SpO2 98%   BMI 37.41 kg/m   General: Alert, pleasant woman. NAD. HEENT: NCAT. MMM. CV: RRR, no murmurs.  Resp: CTAB, no wheezing or crackles. Normal WOB on RA.  Abm: Soft, nontender, nondistended. BS present. Ext: Moves all ext spontaneously Skin: Warm, well perfused   ASSESSMENT/PLAN:    Assessment & Plan Type 2 diabetes mellitus with hyperglycemia, without long-term current use of insulin (HCC) A1c at goal. -Check UACR -Continue metformin, Trulicity Hyperlipidemia due to type 2 diabetes mellitus (HCC) Check lipid panel today.  Continue Crestor Back pain of lumbosacral region with sciatica Chronic, decently controlled pain. -Continue gabapentin -Refill provided for naproxen.  Advised to avoid daily use or prolonged use. -Advised to add Tylenol Rash Refill provided for nystatin powder.     Lincoln Brigham, MD [facility]

## 2023-11-28 NOTE — Patient Instructions (Signed)
Good to see you today - Thank you for coming in  Things we discussed today:  1) congratulations, your A1c is 6.6 today!  Our goal is to keep it under 7, good job.  2) I will check your cholesterol level today.  I will reach out if it is abnormal.  Please always bring your medication bottles  Come back to see me in 4-6 months for follow-up of your A1c

## 2023-11-28 NOTE — Telephone Encounter (Signed)
Pharmacy Patient Advocate Encounter  Received notification from [facility] that Prior Authorization for Emgality  has been DENIED.  Full denial letter will be uploaded to the media tab. See denial reason below.   PA #/Case ID/Reference #: Case: [id number]

## 2023-11-29 ENCOUNTER — Other Ambulatory Visit (HOSPITAL_COMMUNITY): Payer: Self-pay

## 2023-11-29 ENCOUNTER — Encounter: Payer: Self-pay | Admitting: Family Medicine

## 2023-11-29 LAB — LIPID PANEL
Chol/HDL Ratio: 3.6 {ratio} (ref 0.0–4.4)
Cholesterol, Total: 211 mg/dL — ABNORMAL HIGH (ref 100–199)
HDL: 58 mg/dL (ref >39)
LDL Chol Calc (NIH): 129 mg/dL — ABNORMAL HIGH (ref 0–99)
Triglycerides: 136 mg/dL (ref 0–149)
VLDL Cholesterol Cal: 24 mg/dL (ref 5–40)

## 2023-11-29 LAB — MICROALBUMIN / CREATININE URINE RATIO
Creatinine, Urine: 236.2 mg/dL
Microalb/Creat Ratio: 17 mg/g{creat} (ref 0–29)
Microalbumin, Urine: 40.3 ug/mL

## 2023-11-29 MED ORDER — ROSUVASTATIN CALCIUM 40 MG PO TABS
40.0000 mg | ORAL_TABLET | Freq: Every day | ORAL | 3 refills | Status: DC
  Filled 2023-11-29: qty 90, 90d supply, fill #0
  Filled 2024-03-10: qty 90, 90d supply, fill #1
  Filled 2024-06-02: qty 90, 90d supply, fill #2
  Filled 2024-07-16 – 2024-08-25 (×2): qty 90, 90d supply, fill #3

## 2023-11-29 NOTE — Telephone Encounter (Signed)
Are you able to submit appeal for both explaining one is for abortive and one for preventative? And see if that helps with approval? The denial makes it sound like they think she's on it for preventative

## 2023-11-29 NOTE — Assessment & Plan Note (Signed)
A1c at goal. -Check UACR -Continue metformin, Trulicity

## 2023-11-29 NOTE — Telephone Encounter (Signed)
For Bernita Raisin they did not ask if for preventative or abortive however I did specify directions for use are for PRN with QTY of 10 tablets an for the [facility] they also did not ask but I also did include the directions.

## 2023-11-29 NOTE — Assessment & Plan Note (Signed)
Chronic, decently controlled pain. -Continue gabapentin -Refill provided for naproxen.  Advised to avoid daily use or prolonged use. -Advised to add Tylenol

## 2023-11-29 NOTE — Assessment & Plan Note (Signed)
Check lipid panel today.  Continue Crestor

## 2023-11-29 NOTE — Addendum Note (Signed)
Addended by: Lincoln Brigham on: 11/29/2023 01:45 PM   Modules accepted: Orders

## 2023-12-02 ENCOUNTER — Other Ambulatory Visit (HOSPITAL_COMMUNITY): Payer: Self-pay

## 2023-12-02 NOTE — Telephone Encounter (Signed)
Will send to the appeals pharmacist for review.

## 2023-12-02 NOTE — Telephone Encounter (Signed)
Will route to [employer] Pharmacist for review.

## 2023-12-03 ENCOUNTER — Encounter: Payer: Self-pay | Admitting: Pulmonary Disease

## 2023-12-03 ENCOUNTER — Encounter: Payer: Self-pay | Admitting: Family Medicine

## 2023-12-03 NOTE — Telephone Encounter (Signed)
Patient returned phone call. Informed patient message. Patient verbalized understand

## 2023-12-03 NOTE — Telephone Encounter (Signed)
Pt said, insurance denied the PA for Ubrelvy. Insurance faxed a form to be filled out on why have to be on this medication. Would like a call back.

## 2023-12-03 NOTE — Telephone Encounter (Addendum)
Tried calling pt back. Mailbox full. SMS sent. If pt calls, let her know we are trying to appeal the denial for Ubrelvy. Once we hear from appeal team, we will update her.

## 2023-12-03 NOTE — Telephone Encounter (Signed)
Tried calling pt back. Mailbox full. SMS sent. If pt calls, let her know we are trying to appeal the denial for Emgaity. Once we hear from appeal team on update, we will let her know.

## 2023-12-03 NOTE — Telephone Encounter (Signed)
Pt said, insurance denied the PA for [employer] faxed a form to be filled out on why have to be on this medication. Would like a call back.

## 2023-12-03 NOTE — Telephone Encounter (Signed)
Pharmacy team can you look into this?

## 2023-12-04 ENCOUNTER — Telehealth: Payer: Self-pay | Admitting: Pharmacist

## 2023-12-04 NOTE — Telephone Encounter (Signed)
 An appeal requires your assistance. Please see the question below and advise accordingly.   [name] insurance has denied both Emgality  and Nurtec for the following reasons:  Emgality :     Ubrelvy :     Regarding headache frequency, her chart indicates she has experienced 8-12 headache days per month in the past. However, I couldn't find a specific record of the number of migraine days per month. Per insurance requirements, documentation must reflect 15 or more headache days per month.  I also see that she has previously tried and failed multiple drug classes for migraines and has documented allergies to sumatriptan  and amitriptyline. Providing more details on the medications she has failed may strengthen the appeal.  Both denials specify that the patient will not be taking another CGRP inhibitor for abortive or preventative therapy. Additional clinical rationale explaining why she is unable to try another abortive or preventative medication would be beneficial. Please advise on how you would like to proceed.  Thank you, Devere Pandy, PharmD Clinical Pharmacist  [place]  Direct Dial: [phone]

## 2023-12-05 ENCOUNTER — Encounter: Payer: Self-pay | Admitting: Family Medicine

## 2023-12-06 NOTE — Telephone Encounter (Signed)
 Spoke with [facility] regarding Dupixent  copay - patient will need to call copay card company to ask for debit card information  Insurance is paying  [phone] Copay card is paying $112.25  Remaining balance is $2583.79

## 2023-12-17 NOTE — Telephone Encounter (Signed)
Pt returned call and stated that she has Headache days per month 3 per month while taking emgality and took [place] as rescue for those three  Prior starting emgality headaches were 14 days or greater per month.   Asked if taken maxalt and pt unsure if she had tried it. Pt stated no reaction to zomig.

## 2023-12-18 ENCOUNTER — Other Ambulatory Visit: Payer: Self-pay | Admitting: Family Medicine

## 2023-12-18 ENCOUNTER — Telehealth: Payer: Self-pay | Admitting: Pharmacist

## 2023-12-18 NOTE — Telephone Encounter (Signed)
Expedited appeal has been requested and submitted for Emgality 120MG /ML auto-injectors (migraine). Will advise when response is received.  Appeal letter and supporting documentation were faxed to [phone] on 12/18/2023 @2 :15pm.  Thank you, Dellie Burns, PharmD Clinical Pharmacist    Direct Dial: [phone]

## 2023-12-18 NOTE — Telephone Encounter (Signed)
[name] has been discontinued, no appeal needed due to insurance requirements.  Appeal will be submitted for Emgality.  Appeal has been submitted and documented in separate encounter.

## 2023-12-18 NOTE — Telephone Encounter (Signed)
[name]- can you please review/answer questions? Thank you

## 2023-12-19 ENCOUNTER — Telehealth: Payer: Self-pay | Admitting: Pharmacist

## 2023-12-19 ENCOUNTER — Telehealth: Payer: Self-pay | Admitting: Student

## 2023-12-19 NOTE — Telephone Encounter (Signed)
Per insurance, this did not meet the requirements for an expedited appeal.  They have 30 days to review and make a decision.

## 2023-12-19 NOTE — Telephone Encounter (Signed)
**  After Hours/ Emergency Line Call**  Received a page to call [phone] - [phone].  Patient: [name]  Caller: Self  Confirmed name & DOB of patient with caller  Subjective:  Has been having severe stomach pain and loose stools. Appreciates stools are orange looking, with notable pain. This started last Thursday after eating cafeteria food. Has had loose stools since. Denies fevers, body aches, and chills. No blood in the stools.     Objective:  Observations: NAD    Assessment & Plan  [name] is a 58 y.o. female with PMHx who calls with the following complaints and concerns: loose stools. Patient denies any fevers or systemic symptoms, and denies any blood in stools. Unsure etiology of this issues, but did not feel this was emergent and could be handled by PCP. Patient was agreeable and scheduled with their pcp.       Recommendations:  F/u w/ PCP   -- Red flags discussed.   -- Will forward to PCP.  Bess Kinds, MD [facility], PGY-1

## 2023-12-19 NOTE — Telephone Encounter (Signed)
ERROR

## 2023-12-19 NOTE — Telephone Encounter (Signed)
Sent mychart msg in another encounter

## 2023-12-20 ENCOUNTER — Telehealth: Payer: Self-pay

## 2023-12-20 ENCOUNTER — Other Ambulatory Visit (HOSPITAL_COMMUNITY): Payer: Self-pay

## 2023-12-20 NOTE — Telephone Encounter (Signed)
Pt had initially messaged clinic regarding issues with her copay card paying a very small amount and leaving her with a copay exceeding $2,000. Devki had communicated with pt via MyChart and explained that she would likely need to obtain a manufacturer debit card to cover the remaining copy. Devki advised what the next steps were as well as how to complete them. Pt responded with this message:    Attempted to contact pt to obtain further details and try to determine what exactly was said during her phone call, however I had to leave a VoiceMail requesting a return call. Direct office number provided. Will await f/u.

## 2023-12-24 ENCOUNTER — Telehealth: Payer: Self-pay | Admitting: Pharmacist

## 2023-12-24 ENCOUNTER — Encounter: Payer: Self-pay | Admitting: Family Medicine

## 2023-12-24 ENCOUNTER — Other Ambulatory Visit (HOSPITAL_COMMUNITY): Payer: Self-pay

## 2023-12-24 ENCOUNTER — Ambulatory Visit (INDEPENDENT_AMBULATORY_CARE_PROVIDER_SITE_OTHER): Payer: No Typology Code available for payment source | Admitting: Family Medicine

## 2023-12-24 VITALS — BP 140/80 | HR 66 | Temp 98.4°F | Ht 61.0 in | Wt 193.4 lb

## 2023-12-24 DIAGNOSIS — R197 Diarrhea, unspecified: Secondary | ICD-10-CM

## 2023-12-24 DIAGNOSIS — R1011 Right upper quadrant pain: Secondary | ICD-10-CM | POA: Diagnosis not present

## 2023-12-24 DIAGNOSIS — E1165 Type 2 diabetes mellitus with hyperglycemia: Secondary | ICD-10-CM | POA: Diagnosis not present

## 2023-12-24 MED ORDER — TRULICITY 3 MG/0.5ML ~~LOC~~ SOAJ
3.0000 mg | SUBCUTANEOUS | 2 refills | Status: DC
  Filled 2023-12-24 – 2024-01-03 (×2): qty 2, 28d supply, fill #0
  Filled 2024-03-10: qty 2, 28d supply, fill #1
  Filled 2024-04-20: qty 2, 28d supply, fill #2

## 2023-12-24 NOTE — Progress Notes (Unsigned)
    SUBJECTIVE:   CHIEF COMPLAINT / HPI:   IG is a 58yo F w/ hx of T2DM, anxiety, HTN p/f diarrhea - Had loose, yellowish stools 2 wks ago. - Also had about 2-3 days of abm pain at the start, which has since improved. - Stools started to turn yellow-ish in color. - Symptoms are starting to improve at this point. She had a stool this morning that was her regular consistency and formed. - No fevers, vomiting. - Pt read online that this may be related gallbladder issues, although pt denies having gallstones or liver issues in the past  T2DM -Was having issues getting her Trulicity covered at her insurance.  She was calling her insurance and would like to have Trulicity refill sent again to see if it can be covered.  OBJECTIVE:   BP (!) 140/80   Pulse 66   Temp 98.4 F (36.9 C) (Oral)   Ht 5\' 1"  (1.549 m)   Wt 193 lb 6 oz (87.7 kg)   LMP 05/21/2017 (Exact Date)   SpO2 96%   BMI 36.54 kg/m   General: Alert, pleasant well-appearing woman. NAD. HEENT: NCAT. MMM. CV: RRR, no murmurs. Cap refill <2. Resp: CTAB, no wheezing or crackles. Normal WOB on RA.  Abm: RUQ tenderness. No rebound, rigidity, guarding. Soft, nondistended. BS present. Ext: Moves all ext spontaneously Skin: Warm, well perfused   ASSESSMENT/PLAN:   Assessment & Plan Diarrhea, unspecified type Likely infectious gastro, and reassuringly now resolving. VSS, tolerating PO, and afebrile. Gallbladder and biliary etiologies are considered, will get CMP to check.  -Encouraged hydration and supportive measures -Discussed return precautions -Follow-up CMP Type 2 diabetes mellitus with hyperglycemia, without long-term current use of insulin (HCC) Refill sent for Trulicity.  If patient has difficulty getting this covered, advised patient to report back so we can find an alternative.  Follow-up in 3 months for A1c    Lincoln Brigham, MD [facility]

## 2023-12-24 NOTE — Patient Instructions (Signed)
 Good to see you today - Thank you for coming in  Things we discussed today:  1) Your stomach issues were most likely due to a viral gastroenteritis. These usually improve over 7-14 days, but it may take a bit longer for your stools to return to their original consistency.  - We will check your labs  2) I will resend your trulicity to your [facility] pharmacy.  Come back to see me in 3 months to recheck your A1c.

## 2023-12-24 NOTE — Telephone Encounter (Signed)
 Per [employer] the appeal has been approved.  Emgality 120MG /ML auto-injectors is approved from 12/20/2023 through 06/18/2024.  Thank you, Dellie Burns, PharmD Clinical Pharmacist  [place]  Direct Dial: [phone]

## 2023-12-25 ENCOUNTER — Other Ambulatory Visit (HOSPITAL_COMMUNITY): Payer: Self-pay

## 2023-12-25 LAB — COMPREHENSIVE METABOLIC PANEL
ALT: 26 [IU]/L (ref 0–32)
AST: 21 [IU]/L (ref 0–40)
Albumin: 4.5 g/dL (ref 3.8–4.9)
Alkaline Phosphatase: 71 [IU]/L (ref 44–121)
BUN/Creatinine Ratio: 15 (ref 9–23)
BUN: 14 mg/dL (ref 6–24)
Bilirubin Total: 0.2 mg/dL (ref 0.0–1.2)
CO2: 23 mmol/L (ref 20–29)
Calcium: 9.8 mg/dL (ref 8.7–10.2)
Chloride: 104 mmol/L (ref 96–106)
Creatinine, Ser: 0.93 mg/dL (ref 0.57–1.00)
Globulin, Total: 3 g/dL (ref 1.5–4.5)
Glucose: 85 mg/dL (ref 70–99)
Potassium: 4.2 mmol/L (ref 3.5–5.2)
Sodium: 144 mmol/L (ref 134–144)
Total Protein: 7.5 g/dL (ref 6.0–8.5)
eGFR: 72 mL/min/{1.73_m2} (ref >59)

## 2023-12-26 NOTE — Assessment & Plan Note (Signed)
 Refill sent for Trulicity.  If patient has difficulty getting this covered, advised patient to report back so we can find an alternative.  Follow-up in 3 months for A1c

## 2023-12-30 ENCOUNTER — Telehealth: Payer: Self-pay

## 2023-12-30 NOTE — Telephone Encounter (Signed)
 Pharmacy Patient Advocate Encounter   Received notification from CoverMyMeds that prior authorization for TRULICITY 3MG  is required/requested.   The patient is insured through [facility] .   PA required; PA submitted to above mentioned insurance via CoverMyMeds Key/confirmation #/EOC [id number]. Status is pending

## 2023-12-31 NOTE — Telephone Encounter (Signed)
 Pharmacy Patient Advocate Encounter  Received notification from [facility] that Prior Authorization for TRULICITY 3MG  has been APPROVED from 12/30/23 to 12/29/24   PA #/Case ID/Reference #: [id number]

## 2024-01-03 ENCOUNTER — Other Ambulatory Visit: Payer: Self-pay | Admitting: Family Medicine

## 2024-01-03 ENCOUNTER — Ambulatory Visit: Payer: No Typology Code available for payment source | Admitting: Pulmonary Disease

## 2024-01-03 ENCOUNTER — Encounter: Payer: Self-pay | Admitting: Pulmonary Disease

## 2024-01-03 ENCOUNTER — Other Ambulatory Visit: Payer: Self-pay

## 2024-01-03 ENCOUNTER — Other Ambulatory Visit (HOSPITAL_COMMUNITY): Payer: Self-pay

## 2024-01-03 ENCOUNTER — Other Ambulatory Visit: Payer: Self-pay | Admitting: Pulmonary Disease

## 2024-01-03 VITALS — BP 148/90 | HR 72 | Temp 97.1°F | Ht 61.0 in | Wt 197.6 lb

## 2024-01-03 DIAGNOSIS — J329 Chronic sinusitis, unspecified: Secondary | ICD-10-CM

## 2024-01-03 DIAGNOSIS — J4550 Severe persistent asthma, uncomplicated: Secondary | ICD-10-CM | POA: Diagnosis not present

## 2024-01-03 DIAGNOSIS — R102 Pelvic and perineal pain: Secondary | ICD-10-CM

## 2024-01-03 DIAGNOSIS — J449 Chronic obstructive pulmonary disease, unspecified: Secondary | ICD-10-CM | POA: Diagnosis not present

## 2024-01-03 DIAGNOSIS — G4733 Obstructive sleep apnea (adult) (pediatric): Secondary | ICD-10-CM

## 2024-01-03 DIAGNOSIS — Z87891 Personal history of nicotine dependence: Secondary | ICD-10-CM

## 2024-01-03 MED ORDER — GABAPENTIN 300 MG PO CAPS
300.0000 mg | ORAL_CAPSULE | Freq: Three times a day (TID) | ORAL | 3 refills | Status: DC
  Filled 2024-01-03: qty 90, 30d supply, fill #0
  Filled 2024-03-10: qty 90, 30d supply, fill #1
  Filled 2024-04-20: qty 90, 30d supply, fill #2
  Filled 2024-06-02: qty 90, 30d supply, fill #3
  Filled 2024-07-16: qty 90, 30d supply, fill #4
  Filled 2024-08-25: qty 90, 30d supply, fill #5
  Filled 2024-09-29: qty 90, 30d supply, fill #6

## 2024-01-03 MED ORDER — TRELEGY ELLIPTA 200-62.5-25 MCG/ACT IN AEPB
1.0000 | INHALATION_SPRAY | Freq: Every day | RESPIRATORY_TRACT | 11 refills | Status: DC
  Filled 2024-01-03: qty 60, 30d supply, fill #0
  Filled 2024-03-10: qty 60, 30d supply, fill #1
  Filled 2024-04-20: qty 60, 30d supply, fill #2
  Filled 2024-06-02: qty 60, 30d supply, fill #3
  Filled 2024-07-16: qty 60, 30d supply, fill #4
  Filled 2024-08-25: qty 60, 30d supply, fill #5
  Filled 2024-09-29: qty 60, 30d supply, fill #6

## 2024-01-03 MED ORDER — AZITHROMYCIN 250 MG PO TABS
ORAL_TABLET | ORAL | 0 refills | Status: AC
  Filled 2024-01-03: qty 6, 5d supply, fill #0

## 2024-01-03 NOTE — Progress Notes (Signed)
 Subjective:    Patient ID: [name], female    DOB: [birth year], 58 y.o.   MRN: [id number]  Patient Care Team: Lincoln Brigham, MD as PCP - General (Family Medicine) Salena Saner, MD as Consulting Physician (Pulmonary Disease)  Chief Complaint  Patient presents with   Follow-up    DOE. No wheezing. Cough in the morning and at night. Yellow/clear sputum.     BACKGROUND/INTERVAL: Jolyn is a 58 year old former smoker with very minimal tobacco exposure in the past who presents for follow-up on the issue of moderate to severe persistent asthma with asthmatic bronchitis, cough and chronic rhinosinusitis.  This is a scheduled visit.  She was last evaluated on 27 November 2023.  She started Dupixent in January 2023.  HPI Discussed the use of AI scribe software for clinical note transcription with the patient, who gave verbal consent to proceed.  History of Present Illness   The patient, with asthma and COPD, presents with difficulty obtaining Dupixent for management of her conditions.  She has been unable to obtain Dupixent for her asthma and COPD management due to insurance issues, and has been without the medication since December. [facility] informed [facility] that the cost would be $2,000, which she finds unaffordable. She is awaiting a [facility].  She has a history of asthma and COPD and is concerned about her condition worsening without Dupixent. Two weeks ago, she experienced coughing and a cold, for which she took a Z-Pak and used her nebulizer. She reports coughing at night, producing phlegm that is sometimes clear and sometimes yellow.  She uses a CPAP machine every night and has not been in contact with her previous sleep specialist (Dr. Nicholos Johns) since he left the practice. Her husband reports that she snores, and she acknowledges this issue.     Review of Systems A 10 point review of systems was performed and it is as noted above otherwise negative.   Patient  Active Problem List   Diagnosis Date Noted   Adjustment disorder with anxiety 01/18/2023   Malodorous urine 12/22/2022   Pruritic intertrigo 04/18/2022   Hyperlipidemia due to type 2 diabetes mellitus (HCC) 11/27/2021   Chronic mixed headache syndrome 06/05/2021   Back pain of lumbosacral region with sciatica 06/08/2020   Scar of scalp 11/13/2019   Restless leg syndrome 09/05/2017   Chronic migraine without aura with status migrainosus, not intractable 07/26/2017   Type 2 diabetes mellitus with hyperglycemia (HCC) 10/03/2015   HTN (hypertension) 10/03/2015   Fatigue 07/21/2015   Dyspnea 07/20/2013   Vaginal discharge 04/23/2012   Obstructive sleep apnea 09/08/2010   Hypercholesterolemia 09/20/2008   HOT FLASHES 09/20/2008   Depression with anxiety 12/26/2006   Asthma 12/26/2006    Social History   Tobacco Use   Smoking status: Former    Current packs/day: 0.00    Average packs/day: 0.3 packs/day for 15.0 years (3.8 ttl pk-yrs)    Types: Cigarettes    Start date: 02/16/1988    Quit date: 02/16/2003    Years since quitting: 20.9   Smokeless tobacco: Never  Substance Use Topics   Alcohol use: Yes    Alcohol/week: 3.0 standard drinks of alcohol    Types: 3 Glasses of wine per week    Allergies  Allergen Reactions   Amitriptyline     Abnormal behavior. Just doesn't feel like normal self    Fluticasone-Salmeterol Other (See Comments)    Thrush even with mouth rinsing; worsens cough  Imitrex [Sumatriptan]     Made her head feel like its on fire   Penicillins Itching and Swelling    Has patient had a PCN reaction causing immediate rash, facial/tongue/throat swelling, SOB or lightheadedness with hypotension: No Has patient had a PCN reaction causing severe rash involving mucus membranes or skin necrosis: No Has patient had a PCN reaction that required hospitalization: No Has patient had a PCN reaction occurring within the last 10 years: Yes If all of the above answers are  "NO", then may proceed with Cephalosporin use.    Ciprofloxacin Itching and Rash   Macrobid [Nitrofurantoin Monohyd Macro] Itching   Sulfamethoxazole-Trimethoprim Rash    Current Meds  Medication Sig   albuterol (VENTOLIN HFA) 108 (90 Base) MCG/ACT inhaler Inhale 2 puffs into the lungs every 6 (six) hours as needed for wheezing or shortness of breath.   azelastine (ASTELIN) 0.1 % nasal spray Place 1 spray into both nostrils 2 (two) times daily. Use in each nostril as directed   [EXPIRED] azithromycin (ZITHROMAX) 250 MG tablet Take 2 tablets (500 mg) on  Day 1,  followed by 1 tablet (250 mg) once daily on Days 2 through 5.   benzonatate (TESSALON) 200 MG capsule Take 1 capsule (200 mg total) by mouth 3 (three) times daily as needed.   BIOTIN PO Take 2,000 mg by mouth daily.   blood glucose meter kit and supplies KIT Use as directed.   busPIRone (BUSPAR) 5 MG tablet Take 1&1/2 tablets (7.5 mg total) by mouth 2 (two) times daily.   cetirizine (ZYRTEC) 10 MG tablet Take 1 tablet (10 mg total) by mouth daily.   Dulaglutide (TRULICITY) 3 MG/0.5ML SOAJ Inject 3 mg as directed once a week.   esomeprazole (NEXIUM) 40 MG capsule Take 1 capsule (40 mg total) by mouth daily.   estradiol (ESTRACE VAGINAL) 0.1 MG/GM vaginal cream Place 1 g vaginally daily for 2 weeks, then use twice weekly thereafter.   fluorometholone (FML) 0.1 % ophthalmic suspension Place 1 drop into both eyes in the morning and at bedtime.   fluticasone (FLONASE) 50 MCG/ACT nasal spray Place 2 sprays into both nostrils daily.   Galcanezumab-gnlm (EMGALITY) 120 MG/ML SOAJ Inject 120 mg into the skin every 30 (thirty) days.   glucose blood (FREESTYLE LITE) test strip Use as directed 2 times daily.   Insulin Glargine (BASAGLAR KWIKPEN) 100 UNIT/ML Inject 10 Units into the skin daily ONLY while you are taking Prednisone for COPD exacerbations. Once you finish the steroid, then stop taking the insulin.   Insulin Glargine (BASAGLAR KWIKPEN)  100 UNIT/ML Inject 10 Units into the skin daily only while you are taking Prednisone for COPD exacerbations. Once you finish the steroid, then stop taking the insulin   insulin glargine (LANTUS SOLOSTAR) 100 UNIT/ML Solostar Pen Inject 10 Units into the skin daily only while you are taking Prednisone for COPD exacerbations. Once you finish the steroid, then stop taking the insulin   Lancets (FREESTYLE) lancets Use as directed 2 times daily.   levalbuterol (XOPENEX) 1.25 MG/3ML nebulizer solution Take 1.25 mg by nebulization every 8 (eight) hours as needed for wheezing or shortness of breath.   losartan-hydrochlorothiazide (HYZAAR) 100-25 MG tablet Take 1 tablet by mouth daily.   Multiple Vitamin (MULTIVITAMIN WITH MINERALS) TABS tablet Take 1 tablet by mouth daily.   naproxen (NAPROSYN) 500 MG tablet Take 1 tablet (500 mg total) by mouth 2 (two) times daily as needed for mild or moderate pain   nystatin (NYAMYC) powder  Apply 1 application topically 3 (three) times daily.   ondansetron (ZOFRAN-ODT) 4 MG disintegrating tablet Dissolve 1 tablet under the tongue every 8 (eight) hours as needed.   oxybutynin (DITROPAN XL) 15 MG 24 hr tablet Take 1 tablet (15 mg total) by mouth at bedtime.   phenazopyridine (PYRIDIUM) 200 MG tablet Take 1 tablet (200 mg total) by mouth 3 (three) times daily as needed for pain.   Polyethyl Glycol-Propyl Glycol (SYSTANE OP) Place 1 drop into both eyes in the morning and at bedtime.   Pyridoxine HCl (VITAMIN B-6 PO) Take 1 tablet by mouth daily. Unknown OTC strength   rosuvastatin (CRESTOR) 40 MG tablet Take 1 tablet (40 mg total) by mouth daily.   sertraline (ZOLOFT) 50 MG tablet Take 1 tablet (50 mg total) by mouth daily.   Spacer/Aero-Holding Chambers (AEROCHAMBER MV) inhaler Use as instructed   vitamin C (ASCORBIC ACID) 500 MG tablet Take 500 mg by mouth daily.    [DISCONTINUED] Fluticasone-Umeclidin-Vilant (TRELEGY ELLIPTA) 200-62.5-25 MCG/ACT AEPB Inhale 1 puff into  the lungs daily.   [DISCONTINUED] gabapentin (NEURONTIN) 300 MG capsule Take 1 capsule (300 mg total) by mouth every 8 (eight) hours as needed for pain    Immunization History  Administered Date(s) Administered   Influenza Split 07/22/2016   Influenza Whole 07/28/2008   Influenza,inj,Quad PF,6+ Mos 07/29/2013, 07/13/2019   Influenza-Unspecified 08/12/2014, 07/29/2015, 07/28/2017, 07/29/2020, 07/26/2021, 08/02/2022, 06/30/2023   Moderna Sars-Covid-2 Vaccination 10/21/2019, 11/09/2019   PFIZER Comirnaty(Gray Top)Covid-19 Tri-Sucrose Vaccine 07/19/2020   Pneumococcal Polysaccharide-23 08/01/2016   Td 05/29/2002   Tdap 09/05/2016        Objective:     BP (!) 148/90 (BP Location: Right Arm, Cuff Size: Normal)   Pulse 72   Temp (!) 97.1 F (36.2 C)   Ht 5\' 1"  (1.549 m)   Wt 197 lb 9.6 oz (89.6 kg)   LMP 05/21/2017 (Exact Date)   SpO2 99%   BMI 37.34 kg/m   SpO2: 99 % O2 Device: None (Room air)  GENERAL: Obese woman, no acute distress, fully ambulatory.  No conversational dyspnea. HEAD: Normocephalic, atraumatic.  EYES: Pupils equal, round, reactive to light.  No scleral icterus.  MOUTH: Dentition intact, oral mucosa moist. NECK: Thick neck, supple. No thyromegaly. Trachea midline. No JVD.  No adenopathy. PULMONARY: Good air entry bilaterally.  No adventitious sounds. CARDIOVASCULAR: S1 and S2. Regular rate and rhythm.  No rubs, murmurs or gallops heard. ABDOMEN: Obese. MUSCULOSKELETAL: No joint deformity, no clubbing, no edema.  NEUROLOGIC: No focal deficit, no gait disturbance, speech is fluent. SKIN: Intact,warm,dry. PSYCH: Mood and behavior normal.     Assessment & Plan:     ICD-10-CM   1. Severe persistent asthma without complication  J45.50     2. Chronic rhinosinusitis  J32.9     3. OSA on CPAP  G47.33      Meds ordered this encounter  Medications   azithromycin (ZITHROMAX) 250 MG tablet    Sig: Take 2 tablets (500 mg) on  Day 1,  followed by 1 tablet  (250 mg) once daily on Days 2 through 5.    Dispense:  6 each    Refill:  0    Discussion:    Asthma Asthma, off Dupixent since December due to insurance issues. Recent cold and cough managed with Z-Pak and nebulizer. Occasional nighttime coughing with phlegm, indicating inflammation. Discussed resuming Dupixent to manage symptoms and prevent exacerbations. - [facility] - Ensure nebulizer prescription for  future use  Chronic Obstructive Pulmonary Disease (COPD) COPD managed with nebulizer. Recent exacerbation managed with Z-Pak and nebulizer. Discussed need for consistent medication to prevent exacerbations. - Send prescription for nebulizer medication  Obstructive Sleep Apnea Uses CPAP nightly and reports compliance. No recent contact with previous sleep specialist, Dr. Nicholos Johns. Discussed importance of regular follow-up for CPAP management. - Request compliance report from [employer] - Refer to in-house sleep specialist if issues arise  Follow-up - Follow up in four months - Provided prescription for azithromycin Z-Pak in case of flare - Provide work excuse note.      Advised if symptoms do not improve or worsen, to please contact office for sooner follow up or seek emergency care.    I spent 40 minutes of dedicated to the care of this patient on the date of this encounter to include pre-visit review of records, face-to-face time with the patient discussing conditions above, post visit ordering of testing, clinical documentation with the electronic health record, making appropriate referrals as documented, and communicating necessary findings to members of the patients care team.     C. Danice Goltz, MD Advanced Bronchoscopy P[facility]    *This note was generated using voice recognition software/Dragon and/or AI transcription program.  Despite best efforts to proofread, errors can occur which can change  the meaning. Any transcriptional errors that result from this process are unintentional and may not be fully corrected at the time of dictation.

## 2024-01-03 NOTE — Telephone Encounter (Signed)
 Patient was seen in the office today. She still has not been able to get her Dupixent. She is saying she needs a new prescription sent to her pharmacy.   Pharmacy team can you look into this?

## 2024-01-03 NOTE — Patient Instructions (Signed)
 VISIT SUMMARY:  During today's visit, we discussed your ongoing issues with asthma, COPD, and obstructive sleep apnea. We addressed your difficulty in obtaining Dupixent due to insurance issues and reviewed your recent symptoms and treatments. We also talked about the importance of regular follow-up for your CPAP management.  YOUR PLAN:  -ASTHMA: Asthma is a condition where your airways narrow and swell, producing extra mucus, which can make breathing difficult. You have been off Dupixent since December due to insurance issues, and we discussed resuming it to manage your symptoms and prevent exacerbations. A new [facility], continue your current maintenance medications of Trelegy and as needed albuterol.  -CHRONIC OBSTRUCTIVE PULMONARY DISEASE (COPD): COPD is a chronic inflammatory lung disease that obstructs airflow from the lungs. Your recent exacerbation was managed with a Z-Pak and nebulizer. We discussed the need for consistent medication to prevent future exacerbations, and a prescription for Azithromycin will be sent.  -OBSTRUCTIVE SLEEP APNEA: Obstructive sleep apnea is a condition where your breathing repeatedly stops and starts during sleep. You use a CPAP machine nightly and report compliance. We discussed the importance of regular follow-up for CPAP management.  We need to have your SD card to be able to download the data from your CPAP.  INSTRUCTIONS:  Please follow up in four months. A work excuse note will be provided.

## 2024-01-08 ENCOUNTER — Other Ambulatory Visit (HOSPITAL_COMMUNITY): Payer: Self-pay

## 2024-01-08 NOTE — Telephone Encounter (Signed)
 Pharmacy team has already attempted to investigate issues, we never received any return call from patient despite direct phone number being provided. It does not appear that previous message had been relayed to pt as she was not instructed to contact us after her appointment.  Reached out to pt and discussed. She was not informed as to why she was "ineligible" for debit card. Personally contacted the copay card hotline with pt on the line. Per rep, pt has been sent multiple emails with the debit card information. Rep resent email with card details and walked pt through obtaining information.   Instructed [facility] and provide payment information. Advised that there would still be a notably large copay, but not to worry as the manufacturer debit card would handle the payment. Pt verbalized understanding. Nothing further should be needed at this time.

## 2024-01-09 ENCOUNTER — Telehealth: Payer: Self-pay | Admitting: Family Medicine

## 2024-01-09 NOTE — Telephone Encounter (Signed)
**  After Hours/ Emergency Line Call**  Received a page to call [phone] - [phone].  Patient: RYENNE LYNAM  Caller: Self  Confirmed name & DOB of patient with caller  Subjective:  Is having abdominal pain that significantly worse with bowel movements. Saw Dr. Sherrilee Gilles two weeks for for yellowish stools. Since then stools have return to dark brown color. Is having more frequent stools. No straining, stools are soft. Tonight had bright red blood in stool. No lightheaded or dizziness. Continues to have significant lower abdominal pain This is the first time that has happened.   Has had some fatigue. Is short of breath but states she does have COPD. Has had a colonoscopy in 2019 that was normal.    Objective:  Observations: NAD, comfortably talking over the phone.  Assessment & Plan  MARCIANA UPLINGER is a 58 y.o. female with PMHx s/f HTN, T2DM, OSA, Asthma who calls with concern for new bright red blood per rectum. Unclear cause based on history. Needs further evaluation. Possible symptomatic anemia.  Recommendations:  Recommended going to ED for evaluation given possible symptomatic anemia. Pending results inpatient vs outpatient evaluation of GI bleed.  Patient verbalized understanding, stated her son will take her, agree this appropriate.   -- Red flags discussed for 911 transport. -- Will forward to PCP.  Celine Mans, MD, PGY-2 [facility] 8:02 PM 01/09/2024

## 2024-01-10 ENCOUNTER — Other Ambulatory Visit (HOSPITAL_COMMUNITY): Payer: Self-pay

## 2024-01-10 MED ORDER — CEPHALEXIN 500 MG PO CAPS
500.0000 mg | ORAL_CAPSULE | Freq: Two times a day (BID) | ORAL | 0 refills | Status: AC
  Filled 2024-01-10: qty 10, 5d supply, fill #0

## 2024-01-16 ENCOUNTER — Telehealth: Payer: Self-pay

## 2024-01-16 ENCOUNTER — Other Ambulatory Visit (HOSPITAL_COMMUNITY): Payer: Self-pay

## 2024-01-16 NOTE — Telephone Encounter (Signed)
 Patient calls nurse line to inform provider that she was evaluated in the ED on 01/09/24 and that she has a hernia. She is scheduled to see a general surgeon on 01/20/24. She wanted to make sure that Dr. Sherrilee Gilles is aware.   She is also needing a letter stating the medical need for compression stockings.   She is now working for [employer] and [employer] account needs documentation as to why this is needed. (This also needs to include the medical supply store she is receiving them from- [facility])  She is requesting to pick this letter up at appt with Dr. Sherrilee Gilles on 01/21/24.  Will forward message to PCP.   Veronda Prude, RN

## 2024-01-21 ENCOUNTER — Encounter: Payer: Self-pay | Admitting: Family Medicine

## 2024-01-21 ENCOUNTER — Other Ambulatory Visit (HOSPITAL_COMMUNITY): Payer: Self-pay

## 2024-01-21 ENCOUNTER — Ambulatory Visit: Admitting: Family Medicine

## 2024-01-21 VITALS — BP 139/78 | HR 89 | Ht 61.0 in | Wt 195.8 lb

## 2024-01-21 DIAGNOSIS — K439 Ventral hernia without obstruction or gangrene: Secondary | ICD-10-CM | POA: Diagnosis not present

## 2024-01-21 DIAGNOSIS — M7989 Other specified soft tissue disorders: Secondary | ICD-10-CM | POA: Diagnosis not present

## 2024-01-21 MED ORDER — MEDICAL COMPRESSION STOCKINGS MISC: 1.0000 | Freq: Every day | 1 refills | Status: AC

## 2024-01-21 MED ORDER — MEDICAL COMPRESSION STOCKINGS MISC
1.0000 | Freq: Every day | 1 refills | Status: DC
  Filled 2024-01-21: qty 1, fill #0

## 2024-01-21 NOTE — Patient Instructions (Addendum)
 Good to see you today - Thank you for coming in  Things we discussed today:  1) You are cleared for surgery. - Do not take your Trulicity the week before your surgery.  You can resume the Trulicity after your surgery.  2) I am providing a letter that states that you should have compression stockings.  Let us know if there is anything else we can help with.  Come back to see me in 1 to 2 months to recheck your A1c

## 2024-01-21 NOTE — Progress Notes (Signed)
    SUBJECTIVE:   CHIEF COMPLAINT / HPI:   IG is a 58yo F w/ hx of T2DM, HTN that presents for surgical clearance. - Seen 3/13 in ED found to have a ventral hernia, saw general surgery 3/24 and they plan to perform surgery. - Reports eating and drinking normally, no vomiting - Reports having pain in her abdomen with straining with bowel movements. - Denies constipation.  - Taking tylenol for pain.  - Denies blood thinners, ASA, or NSAID use - Tolerated general anesthesia well in the past - Able to climb a flight a stairs without issue - Denies tobacco use or alcohol use  Leg Swelling  Compression Stocking - Previously had a prescription for compression stockings, but her new insurance is not covering. They need a letter that states her medical need for this.   OBJECTIVE:   BP 139/78   Pulse 89   Ht 5\' 1"  (1.549 m)   Wt 195 lb 12.8 oz (88.8 kg)   LMP 05/21/2017 (Exact Date)   SpO2 99%   BMI 37.00 kg/m   General: Alert, pleasant well-appearing woman. NAD. HEENT: NCAT. MMM. CV: RRR, no murmurs. Resp: CTAB, no wheezing or crackles. Normal WOB on RA.  Abm: Discomfort to palpation in periumbilical region.  No hernia palpated.  No rebound, rigidity, guarding.  Soft, nondistended. BS present. Ext: Moves all ext spontaneously Skin: Warm, well perfused   ASSESSMENT/PLAN:   Assessment & Plan Leg swelling Chronic and controlled with compression stockings. Provided letter that compression stocking are medically indicated for pt's pain and has successfully controlled her symptoms in the past.  - cont compression stockings Ventral hernia without obstruction or gangrene BMP, A1c wnl within past 3 months.  Patient is surgically cleared for general anesthesia and ventral hernia procedure.  Advised to hold Trulicity 1 week prior to procedure, and can resume after procedure. - f/u CBC   Lincoln Brigham, MD [facility]

## 2024-01-22 LAB — CBC
Hematocrit: 41.6 % (ref 34.0–46.6)
Hemoglobin: 13.6 g/dL (ref 11.1–15.9)
MCH: 30 pg (ref 26.6–33.0)
MCHC: 32.7 g/dL (ref 31.5–35.7)
MCV: 92 fL (ref 79–97)
Platelets: 304 10*3/uL (ref 150–450)
RBC: 4.54 x10E6/uL (ref 3.77–5.28)
RDW: 12.5 % (ref 11.7–15.4)
WBC: 5.1 10*3/uL (ref 3.4–10.8)

## 2024-01-28 ENCOUNTER — Telehealth: Payer: Self-pay

## 2024-01-28 DIAGNOSIS — K439 Ventral hernia without obstruction or gangrene: Secondary | ICD-10-CM

## 2024-01-28 NOTE — Telephone Encounter (Signed)
 Dr Sherrilee Gilles, will you place a general surgery referral for this patient. It will be for her hernia. Please let me know when this has been done. Thank you Melvenia Beam

## 2024-01-28 NOTE — Telephone Encounter (Signed)
 Referral has been placed and faxed to:  [facility], [facility],  [address],  [place], [place] [address] Contact [phone]  Clemencia Course, CMA

## 2024-01-28 NOTE — Telephone Encounter (Signed)
 Called patient to inform fax to general surgery office has been sent but mailbox is full. Will try to call again.  Clemencia Course, CMA

## 2024-01-28 NOTE — Addendum Note (Signed)
 Addended by: Lincoln Brigham on: 01/28/2024 01:01 PM   Modules accepted: Orders

## 2024-02-06 ENCOUNTER — Telehealth: Payer: Self-pay | Admitting: Pulmonary Disease

## 2024-02-06 NOTE — Telephone Encounter (Signed)
 Patient dropped off ADA Medical Assessment Form 02/06/2024. For Dr. Jayme Cloud to sign.

## 2024-02-07 ENCOUNTER — Telehealth: Payer: Self-pay | Admitting: Pulmonary Disease

## 2024-02-07 NOTE — Telephone Encounter (Signed)
 Pt is a Psychologist, sport and exercise at [employer] and the dr is wanting to know what he needs to know if she needs to be out w/ her job. [phone]

## 2024-02-07 NOTE — Telephone Encounter (Signed)
 Pt is asking for paperwork to be filled out. Dr. Jayme Cloud needs her job description and what accommodations they are looking to do

## 2024-02-11 ENCOUNTER — Telehealth: Payer: Self-pay | Admitting: *Deleted

## 2024-02-11 NOTE — Telephone Encounter (Signed)
 Called pt because we received ADA form from [employer] to complete. She was last seen 03/06/23 and next f/u 03/17/24.   She is requesting form be completed for days she has migraines during the month to be able to leave work. She currently has about 5 migraines or less per month. She is planning to discuss form at upcoming appt. Aware form will be held until her appt with Amy.

## 2024-02-12 DIAGNOSIS — Z0289 Encounter for other administrative examinations: Secondary | ICD-10-CM

## 2024-02-17 NOTE — Telephone Encounter (Signed)
 Per Lovett Ruck, she sent her a MyChart message and has left her a voicemail to call back with this information.

## 2024-02-17 NOTE — Telephone Encounter (Signed)
 No pt hasn't given us  the information Dr. Viva Grise needs to finish paperwork

## 2024-03-10 ENCOUNTER — Other Ambulatory Visit: Payer: Self-pay | Admitting: Family Medicine

## 2024-03-10 ENCOUNTER — Other Ambulatory Visit (HOSPITAL_COMMUNITY): Payer: Self-pay

## 2024-03-10 ENCOUNTER — Other Ambulatory Visit: Payer: Self-pay

## 2024-03-10 MED ORDER — EMGALITY 120 MG/ML ~~LOC~~ SOAJ
120.0000 mg | SUBCUTANEOUS | 0 refills | Status: DC
  Filled 2024-03-10: qty 1, 30d supply, fill #0

## 2024-03-10 NOTE — Telephone Encounter (Signed)
 Last seen on 03/06/23 Follow up scheduled on 03/17/24

## 2024-03-11 ENCOUNTER — Other Ambulatory Visit: Payer: Self-pay

## 2024-03-11 ENCOUNTER — Other Ambulatory Visit (HOSPITAL_COMMUNITY): Payer: Self-pay

## 2024-03-12 ENCOUNTER — Other Ambulatory Visit (HOSPITAL_COMMUNITY): Payer: Self-pay

## 2024-03-12 HISTORY — PX: HERNIA REPAIR: SHX51

## 2024-03-12 MED ORDER — ONDANSETRON HCL 4 MG PO TABS
4.0000 mg | ORAL_TABLET | Freq: Three times a day (TID) | ORAL | 0 refills | Status: DC | PRN
  Filled 2024-03-12: qty 20, 7d supply, fill #0

## 2024-03-12 MED ORDER — HYDROCODONE-ACETAMINOPHEN 5-325 MG PO TABS
1.0000 | ORAL_TABLET | Freq: Four times a day (QID) | ORAL | 0 refills | Status: DC
  Filled 2024-03-12: qty 15, 2d supply, fill #0

## 2024-03-16 ENCOUNTER — Telehealth: Payer: Self-pay

## 2024-03-16 NOTE — Progress Notes (Signed)
 PATIENT: [name] DOB: 12/21/1965  REASON FOR VISIT: follow up HISTORY FROM: patient  Chief Complaint  Patient presents with   RM 2/Migraines     Pt is here Alone. Pt states that migraines worsened about 3 months ago when not getting Emgality  consistently. Seem well managed on Emgality .      HISTORY OF PRESENT ILLNESS:  03/17/24 ALL: [name] returns for follow up for migraines. She was last seen 02/2023 and had recently switched to Emgality  d/t insurance. Nurtec continued for abortive therapy. Insurance would not cover Nurtec and Ubrelvy  started but they would not cover two CGRPs. We switched abortive to Zomig .   Since, she reports migraines seem fairly well managed when taking Emgality  consistently. She had difficulty getting prescription about 3 months ago and reports significant worsening in headache intensity and frequency. She averages about 1-2 migraines per month when taking medication consistently. She did not fill Zomig . Unclear why. She is taking Tylenol  extra strength as needed and it usually helps. She is status post hernia repair. Having some nausea but otherwise recovering well.   03/06/2023 ALL:  [name] returns for follow up for migraines. She was last seen 02/2022 and doing well on Ajovy  and Nurtec. Ajovy  was switched to Emgality  last week due to insurance coverage. She did have a period of significant worsening from 08/2022-11/2021. She lost her job and was under investigation for reportedly kicking a patient. After state investigation, she was cleared of any wrongdoing. She is doing much better. She has been offered a new position with [employer] and plans to start soon. She has not picked up Emgality . Nurtec continues to be helpful. She reports migraines are well managed at this time. She is seeing a Veterinary surgeon. Follows regularly with PCP. She is using CPAP regularly.   03/01/2022 ALL: [name] returns for follow up for migraines. She was last seen by Dr Tresia Fruit 01/2021 and doing very well.  She continues Ajovy . She feels that injections do help. She reports headaches have waxed and waned since. She is having more trouble with her blood pressure being elevated. PCP has changed her medications. She is under more stress recently. She reports her grand children's mother passed away, recently. She is having 8-12 headache days a month. Maybe 2-3 severe headaches are severe. Rest and warm compresses help. She feels that allergies contribute. She is taking Zyrtec  daily. She is using CPAP every night. Apnea is reportedly well managed. Followed by pulmonology. She works from 3p-3a.   02/01/2021 AA:   She was married in November 2021. She has 5 sons. We looked at wedding photos, she had covid in January, she is still recovering, she follows with pulmonology, she is using her cpap. She is compliant with her cpap. She hasn't had a headache since her allergies. She is doing great.  05/21/2019 ALL:  [name] is a 58 y.o. female here today for follow up of migraines.  Her last visit was about a month ago when she reported having some difficulty at the pharmacy with her medications.  Her dose of Ajovy  was given the last week in May.  She has not had a dose of Ajovy  since.  She reports trying Relpax  that did offer minimal relief.  She continues to have consistent migraines.  She reports at least 4-5 migraines per week.  She was seen by her pulmonologist about a month ago who reported poor compliance with CPAP therapy.  She states that she is usually consistent in use but  over the past 6 weeks or so she has not used her CPAP.  She states that frequent sinus infections and headaches prevent her from using CPAP.  She is treated with Zyrtec  daily for seasonal allergies.  She has a tension type headache nearly every day.  She reports migraines have recently been behind her right eye.  She gets very fatigued, nauseated, dizzy and occasionally has some numbness in her fingertips with migraine.  She reports tension in  her neck and the base of her skull with headaches.  She does occasionally have blurred vision.  She has recently had an eye exam that was reportedly normal.  She does wear glasses.  HISTORY: (copied from my note on 04/01/2019)  Kendra RYBACKI is a 58 y.o. female here today for follow up of migraines. She continues Ajovy  for prevention and Imitrex  for acute management. She is also prescribed gabapentin  for pain and hot flashes as well as naproxen  for pain.  She reports that she was late getting this month's dose of Ajovy  due to pharmacy delay.  She has noted an increased frequency of her headaches this past week.  She has a migraine today that is been present for the past 3 days.  She has not taking any abortive therapy due to not having any.  She reports that this is her typical migraine.  She denies any new finding on.   History (copied from Dr Harding Li note on 03/19/2018)   Interval history 03/19/2018: The Ajovy  has helped with the headaches she likes it. She has had a great response. 2 migraines in April lasted 45 minutes. Most of the month she had no headaches she had some but not severe. Feeling great. Will continue Ajovy . Discussed botox and other treatments.   Interval History 09/10/2017: At last appointment ordered MRIs of the brain and orbits, they were not completed. Also ordered physical therapy but finished only 2/13 visits. Had surgery 08/15/2017. hysterectomy, Bilateral salpingectomy, left oopherectomy. She can't afford MRI or PT. Started Aimovig, but she could not get it. Will try Ajovy , will give a copay ard.    HPI:  [name] is a 58 y.o. female here as a referral from Dr. Fortunato Ill for migraines. Past medical history of migraines, high blood pressure, anxiety, depression, asthma.  Headaches wake her up in the morning. Migraines since the age of 54. Worse this year, she was taking care of her grandmother and passed away and there has been stress and a bad relationship. She has had migraine  for days after getting a flu shot. She was a patient of the [facility]. They start at the back of there head and spread all ove rthe head, she has tenderness of the skin, She has a prodrome feels the pain in the neck. For the last year. She has light and sound sensitivity, it is pulsating and throbbing and pounding with nausea and vomiting and also has vision changes and episodic vision loss unknown eye. Migraines can last weeks, has had one for weeks now, she was in the ED last Saturday, she is missing work. Sleeping in a dark room helps. Stress worsens it. Woke up with a headache again this morning and she has neck pain and neck tightness. Reports daily headaches and at least 15 are migrainous for the last 9 months. She has allergies as well. No medication overuse. No aura. No other focal neurologic deficits, associated symptoms, inciting events or modifiable factors. Lots of stress.    Medications  tried include: Baclofen , Imitrex , Lexapro , gabapentin , losartan , naproxen , Zofran , propranolol , tramadol . She tried amitriptyline and has an allergy to it, zomig    Reviewed notes, labs and imaging from outside physicians, which showed:   presented the emergency room for evaluation of intermittent, generalized headache for the past 4 days on 07/20/2017. Patient had associated dizziness and feeling off balance with nausea. The headache began in the back of the head and radiated to the front. Similar to previous migraines. She tried ibuprofen . The past she received several Botox injections with provided her with 1-2 years relief in her migraines. Denied any numbness, fevers, chills, headache injury, falls, blood thinner use. She was given Compazine , Benadryl  and IV fluids in the emergency room. Symptoms reviewed and she was discharged.   REVIEW OF SYSTEMS: Out of a complete 14 system review of symptoms, the patient complains only of the following symptoms, headaches, anxiety and all other reviewed  systems are negative.  ALLERGIES: Allergies  Allergen Reactions   Amitriptyline     Abnormal behavior. Just doesn't feel like normal self    Fluticasone -Salmeterol Other (See Comments)    Thrush even with mouth rinsing; worsens cough   Imitrex  [Sumatriptan ]     Made her head feel like its on fire   Penicillins Itching and Swelling    Has patient had a PCN reaction causing immediate rash, facial/tongue/throat swelling, SOB or lightheadedness with hypotension: No Has patient had a PCN reaction causing severe rash involving mucus membranes or skin necrosis: No Has patient had a PCN reaction that required hospitalization: No Has patient had a PCN reaction occurring within the last 10 years: Yes If all of the above answers are "NO", then may proceed with Cephalosporin use.    Ciprofloxacin Itching and Rash   Macrobid [Nitrofurantoin Monohyd Macro] Itching   Sulfamethoxazole-Trimethoprim Rash    HOME MEDICATIONS: Outpatient Medications Prior to Visit  Medication Sig Dispense Refill   albuterol  (VENTOLIN  HFA) 108 (90 Base) MCG/ACT inhaler Inhale 2 puffs into the lungs every 6 (six) hours as needed for wheezing or shortness of breath. 6.7 g 6   azelastine  (ASTELIN ) 0.1 % nasal spray Place 1 spray into both nostrils 2 (two) times daily. Use in each nostril as directed 30 mL 12   benzonatate  (TESSALON ) 200 MG capsule Take 1 capsule (200 mg total) by mouth 3 (three) times daily as needed. 45 capsule 3   BIOTIN PO Take 2,000 mg by mouth daily.     blood glucose meter kit and supplies KIT Use as directed. 1 each 0   busPIRone  (BUSPAR ) 5 MG tablet Take 1&1/2 tablets (7.5 mg total) by mouth 2 (two) times daily. 270 tablet 3   cetirizine  (ZYRTEC ) 10 MG tablet Take 1 tablet (10 mg total) by mouth daily. 30 tablet 11   Dulaglutide  (TRULICITY ) 3 MG/0.5ML SOAJ Inject 3 mg as directed once a week. 2 mL 2   Dupilumab  (DUPIXENT ) 300 MG/2ML SOAJ Inject 300 mg into the skin every 14 (fourteen) days. 12 mL 1    Elastic Bandages & Supports (MEDICAL COMPRESSION STOCKINGS) MISC 1 Application by Does not apply route daily. 1 each 1   esomeprazole  (NEXIUM ) 40 MG capsule Take 1 capsule (40 mg total) by mouth daily. 90 capsule 3   estradiol  (ESTRACE  VAGINAL) 0.1 MG/GM vaginal cream Place 1 g vaginally daily for 2 weeks, then use twice weekly thereafter. 42.5 g 2   fluorometholone (FML) 0.1 % ophthalmic suspension Place 1 drop into both eyes in the morning and at  bedtime.     fluticasone  (FLONASE ) 50 MCG/ACT nasal spray Place 2 sprays into both nostrils daily. 16 g 6   Fluticasone -Umeclidin-Vilant (TRELEGY ELLIPTA ) 200-62.5-25 MCG/ACT AEPB Inhale 1 puff into the lungs daily. 60 each 11   gabapentin  (NEURONTIN ) 300 MG capsule Take 1 capsule (300 mg total) by mouth every 8 (eight) hours as needed for pain 180 capsule 3   glucose blood (FREESTYLE LITE) test strip Use as directed 2 times daily. 100 each 0   HYDROcodone -acetaminophen  (NORCO/VICODIN) 5-325 MG tablet Take 1-2 tablets by mouth every 6 (six) hours as needed for moderate (4-6) to severe (7-10) pain. 15 tablet 0   insulin  glargine (LANTUS  SOLOSTAR) 100 UNIT/ML Solostar Pen Inject 10 Units into the skin daily only while you are taking Prednisone  for COPD exacerbations. Once you finish the steroid, then stop taking the insulin  3 mL 3   Lancets (FREESTYLE) lancets Use as directed 2 times daily. 100 each 0   levalbuterol  (XOPENEX ) 1.25 MG/3ML nebulizer solution Take 1.25 mg by nebulization every 8 (eight) hours as needed for wheezing or shortness of breath. 90 mL 2   losartan -hydrochlorothiazide  (HYZAAR ) 100-25 MG tablet Take 1 tablet by mouth daily. 90 tablet 1   Multiple Vitamin (MULTIVITAMIN WITH MINERALS) TABS tablet Take 1 tablet by mouth daily.     naproxen  (NAPROSYN ) 500 MG tablet Take 1 tablet (500 mg total) by mouth 2 (two) times daily as needed for mild or moderate pain 60 tablet 2   nystatin  (NYAMYC ) powder Apply 1 application topically 3 (three)  times daily. 60 g 5   ondansetron  (ZOFRAN ) 4 MG tablet Take 1 tablet (4 mg total) by mouth every 8 (eight) hours as needed for nausea or vomiting. 20 tablet 0   oxybutynin  (DITROPAN  XL) 15 MG 24 hr tablet Take 1 tablet (15 mg total) by mouth at bedtime. 90 tablet 1   phenazopyridine  (PYRIDIUM ) 200 MG tablet Take 1 tablet (200 mg total) by mouth 3 (three) times daily as needed for pain. 6 tablet 0   Polyethyl Glycol-Propyl Glycol (SYSTANE OP) Place 1 drop into both eyes in the morning and at bedtime.     Pyridoxine HCl (VITAMIN B-6 PO) Take 1 tablet by mouth daily. Unknown OTC strength     rosuvastatin  (CRESTOR ) 40 MG tablet Take 1 tablet (40 mg total) by mouth daily. 90 tablet 3   sertraline  (ZOLOFT ) 50 MG tablet Take 1 tablet (50 mg total) by mouth daily. 90 tablet 3   Spacer/Aero-Holding Chambers (AEROCHAMBER MV) inhaler Use as instructed 1 each 0   vitamin C (ASCORBIC ACID) 500 MG tablet Take 500 mg by mouth daily.      Insulin  Glargine (BASAGLAR  KWIKPEN) 100 UNIT/ML Inject 10 Units into the skin daily ONLY while you are taking Prednisone  for COPD exacerbations. Once you finish the steroid, then stop taking the insulin . 3 mL 1   Insulin  Glargine (BASAGLAR  KWIKPEN) 100 UNIT/ML Inject 10 Units into the skin daily only while you are taking Prednisone  for COPD exacerbations. Once you finish the steroid, then stop taking the insulin  3 mL 1   Galcanezumab -gnlm (EMGALITY ) 120 MG/ML SOAJ Inject 120 mg into the skin every 30 (thirty) days. 1 mL 0   ondansetron  (ZOFRAN -ODT) 4 MG disintegrating tablet Dissolve 1 tablet under the tongue every 8 (eight) hours as needed. (Patient not taking: Reported on 03/17/2024) 20 tablet 0   No facility-administered medications prior to visit.    PAST MEDICAL HISTORY: Past Medical History:  Diagnosis Date  Allergy    Anemia    Anxiety    Arthritis    all over   Asthma    COPD (chronic obstructive pulmonary disease) (HCC)    Depression    Dysrhythmia     Irregular heat w/ asthma episodes and anxiety   GERD (gastroesophageal reflux disease)    Hyperlipidemia    Hypertension    Migraines    PONV (postoperative nausea and vomiting)    Pre-diabetes    Sleep apnea    uses CPAP   Urinary incontinence    frequent urination- tx w/ vesicare    Urinary urgency     PAST SURGICAL HISTORY: Past Surgical History:  Procedure Laterality Date   ABDOMINAL HYSTERECTOMY Bilateral 08/15/2017   Procedure: SUPRACERVICAL HYSTERECTOMY ABDOMINAL W/ BILATERAL SALPINGECTOMY AND LEFT OOPHORECTOMY;  Surgeon: Julianne Octave, MD;  Location: [facility];  Service: Gynecology;  Laterality: Bilateral;   ABLATION     CESAREAN SECTION     x3   DILATION AND CURETTAGE OF UTERUS  05/31/2005   MAB, TAB x 2   HERNIA REPAIR  06/29/1989   HERNIA REPAIR  03/12/2024   HYSTEROSCOPY W/ ENDOMETRIAL ABLATION     LEEP     MAB     x 4   MOUTH SURGERY     teeth extraction   TUBAL LIGATION  11/01/2006   WISDOM TOOTH EXTRACTION      FAMILY HISTORY: Family History  Problem Relation Age of Onset   Diabetes Maternal Grandmother    Colon cancer Maternal Grandfather    Anesthesia problems Neg Hx    Headache Neg Hx    Aneurysm Neg Hx    Esophageal cancer Neg Hx    Rectal cancer Neg Hx    Stomach cancer Neg Hx    Migraines Neg Hx     SOCIAL HISTORY: Social History   Socioeconomic History   Marital status: Married    Spouse name: Not on file   Number of children: 5   Years of education: Not on file   Highest education level: High school graduate  Occupational History   Not on file  Tobacco Use   Smoking status: Former    Current packs/day: 0.00    Average packs/day: 0.3 packs/day for 15.0 years (3.8 ttl pk-yrs)    Types: Cigarettes    Start date: 02/16/1988    Quit date: 02/16/2003    Years since quitting: 21.0   Smokeless tobacco: Never  Vaping Use   Vaping status: Never Used  Substance and Sexual Activity   Alcohol use: Yes    Alcohol/week: 3.0 standard  drinks of alcohol    Types: 3 Glasses of wine per week   Drug use: No   Sexual activity: Yes    Partners: Male    Birth control/protection: Surgical  Other Topics Concern   Not on file  Social History Narrative   Lives at home with husband    Right handed   Caffeine : none         ** Merged History Encounter **       Social Drivers of Corporate investment banker Strain: Not on file  Food Insecurity: Not on file  Transportation Needs: Not on file  Physical Activity: Not on file  Stress: Not on file  Social Connections: Unknown (03/28/2023)   Received from [facility], [facility]   Social Network    Social Network: Not on file  Intimate Partner Violence: Unknown (03/28/2023)   [facility]   HITS    Physically Hurt: Not on file    Insult or Talk Down To: Not on file    Threaten Physical Harm: Not on file    Scream or Curse: Not on file      PHYSICAL EXAM  Vitals:   03/17/24 0815 03/17/24 0830  BP:  120/70  Weight: 194 lb 8 oz (88.2 kg)   Height: 5\' 1"  (1.549 m)       Body mass index is 36.75 kg/m.  Generalized: Well developed, in no acute distress  Cardiology: normal rate and rhythm, no murmur noted Neurological examination  Mentation: Alert oriented to time, place, history taking. Follows all commands speech and language fluent Cranial nerve II-XII: Pupils were equal round reactive to light. Extraocular movements were full, visual field were full on confrontational test. Facial sensation and strength were normal. Uvula tongue midline. Head turning and shoulder shrug  were normal and symmetric. Motor: The motor testing reveals 5 over 5 strength of all 4 extremities. Good symmetric motor tone is noted throughout.  Gait and station: Gait is normal.   DIAGNOSTIC DATA (LABS, IMAGING, TESTING) - I reviewed patient records, labs, notes, testing and imaging myself where available.      No data to display           Lab  Results  Component Value Date   WBC 5.1 01/21/2024   HGB 13.6 01/21/2024   HCT 41.6 01/21/2024   MCV 92 01/21/2024   PLT 304 01/21/2024      Component Value Date/Time   NA 144 12/24/2023 1443   K 4.2 12/24/2023 1443   CL 104 12/24/2023 1443   CO2 23 12/24/2023 1443   GLUCOSE 85 12/24/2023 1443   GLUCOSE 179 (H) 05/20/2023 0945   BUN 14 12/24/2023 1443   CREATININE 0.93 12/24/2023 1443   CREATININE 0.82 09/05/2016 1653   CALCIUM  9.8 12/24/2023 1443   PROT 7.5 12/24/2023 1443   ALBUMIN 4.5 12/24/2023 1443   AST 21 12/24/2023 1443   ALT 26 12/24/2023 1443   ALKPHOS 71 12/24/2023 1443   BILITOT 0.2 12/24/2023 1443   GFRNONAA >60 05/20/2023 0945   GFRNONAA 84 09/05/2016 1653   GFRAA 78 05/07/2019 1211   GFRAA >89 09/05/2016 1653   Lab Results  Component Value Date   CHOL 211 (H) 11/28/2023   HDL 58 11/28/2023   LDLCALC 129 (H) 11/28/2023   LDLDIRECT 111 (H) 05/09/2022   TRIG 136 11/28/2023   CHOLHDL 3.6 11/28/2023   Lab Results  Component Value Date   HGBA1C 6.6 11/28/2023   Lab Results  Component Value Date   VITAMINB12 386 09/05/2017   Lab Results  Component Value Date   TSH 2.210 11/27/2021    ASSESSMENT AND PLAN 58 y.o. year old female  has a past medical history of Allergy, Anemia, Anxiety, Arthritis, Asthma, COPD (chronic obstructive pulmonary disease) (HCC), Depression, Dysrhythmia, GERD (gastroesophageal reflux disease), Hyperlipidemia, Hypertension, Migraines, PONV (postoperative nausea and vomiting), Pre-diabetes, Sleep apnea, Urinary incontinence, and Urinary urgency. here with     ICD-10-CM   1. Chronic migraine without aura with status migrainosus, not intractable  G43.701      Pamalee reports migraines are well managed when taking Emgality  consistently. I have refilled Emgality  and advised her to request 3 month supply to assist with consistently. She may continue Tylenol  as needed. I will restart Zomig  5mg  daily as needed. Appropriate dosing and  possible side effects reviewed. I have encouraged her  to use her CPAP nightly for greater than 4 hours each night. She was advised to follow-up in 1 year, sooner if needed.  She verbalizes understanding and agreement with this plan.  No orders of the defined types were placed in this encounter.    Meds ordered this encounter  Medications   Galcanezumab -gnlm (EMGALITY ) 120 MG/ML SOAJ    Sig: Inject 120 mg into the skin every 30 (thirty) days.    Dispense:  3 mL    Refill:  3    Supervising Provider:   AHERN, ANTONIA B [[id number]]   zolmitriptan  (ZOMIG ) 5 MG tablet    Sig: Take 1 tablet (5 mg total) by mouth as needed for migraine.    Dispense:  10 tablet    Refill:  11     Echo Allsbrook, FNP-C 03/17/2024, 8:38 AM [facility] [address] [place], [place] [address] [phone]

## 2024-03-16 NOTE — Patient Instructions (Signed)
 Below is our plan:  We will continue Emgality  every 30 days. Continue Tylenol  as needed. You can use Zomig  5mg  tablet as needed. Please take 1 tablet at onset of headache. May take 1 additional tablet in 2 hours if needed. Do not take more than 2 tablets in 24 hours or more than 10 in a month.   Please make sure you are staying well hydrated. I recommend 50-60 ounces daily. Well balanced diet and regular exercise encouraged. Consistent sleep schedule with 6-8 hours recommended.   Please continue follow up with care team as directed.   Follow up with me in 1 year   You may receive a survey regarding today's visit. I encourage you to leave honest feed back as I do use this information to improve patient care. Thank you for seeing me today!   GENERAL HEADACHE INFORMATION:   Natural supplements: Magnesium  Oxide or Magnesium  Glycinate 500 mg at bed (up to 800 mg daily) Coenzyme Q10 300 mg in AM Vitamin B2- 200 mg twice a day   Add 1 supplement at a time since even natural supplements can have undesirable side effects. You can sometimes buy supplements cheaper (especially Coenzyme Q10) at www.WebmailGuide.co.za or at [facility].  Migraine with aura: There is increased risk for stroke in women with migraine with aura and a contraindication for the combined contraceptive pill for use by women who have migraine with aura. The risk for women with migraine without aura is lower. However other risk factors like smoking are far more likely to increase stroke risk than migraine. There is a recommendation for no smoking and for the use of OCPs without estrogen such as progestogen only pills particularly for women with migraine with aura.Aaron Aas People who have migraine headaches with auras may be 3 times more likely to have a stroke caused by a blood clot, compared to migraine patients who don't see auras. Women who take hormone-replacement therapy may be 30 percent more likely to suffer a clot-based stroke than women not  taking medication containing estrogen. Other risk factors like smoking and high blood pressure may be  much more important.    Vitamins and herbs that show potential:   Magnesium : Magnesium  (250 mg twice a day or 500 mg at bed) has a relaxant effect on smooth muscles such as blood vessels. Individuals suffering from frequent or daily headache usually have low magnesium  levels which can be increase with daily supplementation of 400-750 mg. Three trials found 40-90% average headache reduction  when used as a preventative. Magnesium  may help with headaches are aura, the best evidence for magnesium  is for migraine with aura is its thought to stop the cortical spreading depression we believe is the pathophysiology of migraine aura.Magnesium  also demonstrated the benefit in menstrually related migraine.  Magnesium  is part of the messenger system in the serotonin cascade and it is a good muscle relaxant.  It is also useful for constipation which can be a side effect of other medications used to treat migraine. Good sources include nuts, whole grains, and tomatoes. Side Effects: loose stool/diarrhea  Riboflavin (vitamin B 2) 200 mg twice a day. This vitamin assists nerve cells in the production of ATP a principal energy storing molecule.  It is necessary for many chemical reactions in the body.  There have been at least 3 clinical trials of riboflavin using 400 mg per day all of which suggested that migraine frequency can be decreased.  All 3 trials showed significant improvement in over half of migraine  sufferers.  The supplement is found in bread, cereal, milk, meat, and poultry.  Most Americans get more riboflavin than the recommended daily allowance, however riboflavin deficiency is not necessary for the supplements to help prevent headache. Side effects: energizing, green urine   Coenzyme Q10: This is present in almost all cells in the body and is critical component for the conversion of energy.  Recent studies  have shown that a nutritional supplement of CoQ10 can reduce the frequency of migraine attacks by improving the energy production of cells as with riboflavin.  Doses of 150 mg twice a day have been shown to be effective.   Melatonin: Increasing evidence shows correlation between melatonin secretion and headache conditions.  Melatonin supplementation has decreased headache intensity and duration.  It is widely used as a sleep aid.  Sleep is natures way of dealing with migraine.  A dose of 3 mg is recommended to start for headaches including cluster headache. Higher doses up to 15 mg has been reviewed for use in Cluster headache and have been used. The rationale behind using melatonin for cluster is that many theories regarding the cause of Cluster headache center around the disruption of the normal circadian rhythm in the brain.  This helps restore the normal circadian rhythm.   HEADACHE DIET: Foods and beverages which may trigger migraine Note that only 20% of headache patients are food sensitive. You will know if you are food sensitive if you get a headache consistently 20 minutes to 2 hours after eating a certain food. Only cut out a food if it causes headaches, otherwise you might remove foods you enjoy! What matters most for diet is to eat a well balanced healthy diet full of vegetables and low fat protein, and to not miss meals.   Chocolate, other sweets ALL cheeses except cottage and cream cheese Dairy products, yogurt, sour cream, ice cream Liver Meat extracts (Bovril, Marmite, meat tenderizers) Meats or fish which have undergone aging, fermenting, pickling or smoking. These include: Hotdogs,salami,Lox,sausage, mortadellas,smoked salmon, pepperoni, Pickled herring Pods of broad bean (English beans, Chinese pea pods, [place] (fava) beans, lima and navy beans Ripe avocado, ripe banana Yeast extracts or active yeast preparations such as Brewer's or Fleishman's (commercial bakes goods are  permitted) Tomato based foods, pizza (lasagna, etc.)   MSG (monosodium glutamate) is disguised as many things; look for these common aliases: Monopotassium glutamate Autolysed yeast Hydrolysed protein Sodium caseinate "flavorings" "all natural preservatives" Nutrasweet   Avoid all other foods that convincingly provoke headaches.   Resources: The Dizzy Althia Jetty Your Headache Diet, migrainestrong.com  https://zamora-andrews.com/   Caffeine  and Migraine For patients that have migraine, caffeine  intake more than 3 days per week can lead to dependency and increased migraine frequency. I would recommend cutting back on your caffeine  intake as best you can. The recommended amount of caffeine  is 200-300 mg daily, although migraine patients may experience dependency at even lower doses. While you may notice an increase in headache temporarily, cutting back will be helpful for headaches in the long run. For more information on caffeine  and migraine, visit: https://americanmigrainefoundation.org/resource-library/caffeine -and-migraine/   Headache Prevention Strategies:   1. Maintain a headache diary; learn to identify and avoid triggers.  - This can be a simple note where you log when you had a headache, associated symptoms, and medications used - There are several smartphone apps developed to help track migraines: Migraine Buddy, Migraine Monitor, Curelator N1-Headache App   Common triggers include: Emotional triggers: Emotional/Upset family or friends Emotional/Upset occupation  Business reversal/success Anticipation anxiety Crisis-serious Post-crisis periodNew job/position   Physical triggers: Vacation Day Weekend Strenuous Exercise High Altitude Location New Move Menstrual Day Physical Illness Oversleep/Not enough sleep Weather changes Light: Photophobia or light sesnitivity treatment involves a balance between desensitization and  reduction in overly strong input. Use dark polarized glasses outside, but not inside. Avoid bright or fluorescent light, but do not dim environment to the point that going into a normally lit room hurts. Consider FL-41 tint lenses, which reduce the most irritating wavelengths without blocking too much light.  These can be obtained at axonoptics.com or theraspecs.com Foods: see list above.   2. Limit use of acute treatments (over-the-counter medications, triptans, etc.) to no more than 2 days per week or 10 days per month to prevent medication overuse headache (rebound headache).     3. Follow a regular schedule (including weekends and holidays): Don't skip meals. Eat a balanced diet. 8 hours of sleep nightly. Minimize stress. Exercise 30 minutes per day. Being overweight is associated with a 5 times increased risk of chronic migraine. Keep well hydrated and drink 6-8 glasses of water per day.   4. Initiate non-pharmacologic measures at the earliest onset of your headache. Rest and quiet environment. Relax and reduce stress. Breathe2Relax is a free app that can instruct you on    some simple relaxtion and breathing techniques. Http://Dawnbuse.com is a    free website that provides teaching videos on relaxation.  Also, there are  many apps that   can be downloaded for "mindful" relaxation.  An app called YOGA NIDRA will help walk you through mindfulness. Another app called Calm can be downloaded to give you a structured mindfulness guide with daily reminders and skill development. Headspace for guided meditation Mindfulness Based Stress Reduction Online Course: www.palousemindfulness.com Cold compresses.   5. Don't wait!! Take the maximum allowable dosage of prescribed medication at the first sign of migraine.   6. Compliance:  Take prescribed medication regularly as directed and at the first sign of a migraine.   7. Communicate:  Call your physician when problems arise, especially if your  headaches change, increase in frequency/severity, or become associated with neurological symptoms (weakness, numbness, slurred speech, etc.). Proceed to emergency room if you experience new or worsening symptoms or symptoms do not resolve, if you have new neurologic symptoms or if headache is severe, or for any concerning symptom.   8. Headache/pain management therapies: Consider various complementary methods, including medication, behavioral therapy, psychological counselling, biofeedback, massage therapy, acupuncture, dry needling, and other modalities.  Such measures may reduce the need for medications. Counseling for pain management, where patients learn to function and ignore/minimize their pain, seems to work very well.   9. Recommend changing family's attention and focus away from patient's headaches. Instead, emphasize daily activities. If first question of day is 'How are your headaches/Do you have a headache today?', then patient will constantly think about headaches, thus making them worse. Goal is to re-direct attention away from headaches, toward daily activities and other distractions.   10. Helpful Websites: www.AmericanHeadacheSociety.org PatentHood.ch www.headaches.org TightMarket.nl www.achenet.org

## 2024-03-16 NOTE — Telephone Encounter (Signed)
 Patient calls line in regards to muscle cramps.   She reports she had hernia surgery on 5/15, however reports ever since then she has had leg cramps, mainly at night.  She reports she has been taking Gabapentin , however this has not been helping. She reports she has been staying well hydrated.   She is requesting something for relief. She reports she can come into the office, however prefers not to due to recent surgery.   Will forward to PCP.

## 2024-03-17 ENCOUNTER — Other Ambulatory Visit (HOSPITAL_COMMUNITY): Payer: Self-pay

## 2024-03-17 ENCOUNTER — Other Ambulatory Visit: Payer: Self-pay | Admitting: Family Medicine

## 2024-03-17 ENCOUNTER — Encounter: Payer: Self-pay | Admitting: Family Medicine

## 2024-03-17 ENCOUNTER — Ambulatory Visit: Payer: 59 | Admitting: Family Medicine

## 2024-03-17 VITALS — BP 120/70 | Ht 61.0 in | Wt 194.5 lb

## 2024-03-17 DIAGNOSIS — G43701 Chronic migraine without aura, not intractable, with status migrainosus: Secondary | ICD-10-CM

## 2024-03-17 MED ORDER — ZOLMITRIPTAN 5 MG PO TABS
5.0000 mg | ORAL_TABLET | ORAL | 11 refills | Status: DC | PRN
  Filled 2024-03-17: qty 10, 30d supply, fill #0
  Filled 2024-03-17: qty 9, 21d supply, fill #0
  Filled 2024-04-06: qty 10, 30d supply, fill #0
  Filled 2024-09-29: qty 8, 30d supply, fill #1

## 2024-03-17 MED ORDER — EMGALITY 120 MG/ML ~~LOC~~ SOAJ
120.0000 mg | SUBCUTANEOUS | 3 refills | Status: AC
  Filled 2024-03-17: qty 3, 90d supply, fill #0
  Filled 2024-04-20: qty 1, 30d supply, fill #0
  Filled 2024-06-02: qty 1, 30d supply, fill #1
  Filled 2024-07-16: qty 1, 30d supply, fill #2
  Filled 2024-08-25: qty 1, 30d supply, fill #3
  Filled 2024-09-29: qty 1, 30d supply, fill #4
  Filled 2024-11-11: qty 1, 30d supply, fill #5

## 2024-03-27 ENCOUNTER — Encounter: Payer: Self-pay | Admitting: Family Medicine

## 2024-03-30 ENCOUNTER — Other Ambulatory Visit (HOSPITAL_COMMUNITY): Payer: Self-pay

## 2024-04-06 ENCOUNTER — Other Ambulatory Visit (HOSPITAL_COMMUNITY): Payer: Self-pay

## 2024-04-09 ENCOUNTER — Ambulatory Visit: Admitting: Pulmonary Disease

## 2024-04-10 ENCOUNTER — Encounter: Payer: Self-pay | Admitting: Pulmonary Disease

## 2024-04-10 ENCOUNTER — Ambulatory Visit: Admitting: Pulmonary Disease

## 2024-04-10 VITALS — BP 152/90 | HR 65 | Temp 98.4°F | Ht 61.0 in | Wt 199.6 lb

## 2024-04-10 DIAGNOSIS — J329 Chronic sinusitis, unspecified: Secondary | ICD-10-CM

## 2024-04-10 DIAGNOSIS — J4550 Severe persistent asthma, uncomplicated: Secondary | ICD-10-CM

## 2024-04-10 DIAGNOSIS — Z87891 Personal history of nicotine dependence: Secondary | ICD-10-CM | POA: Diagnosis not present

## 2024-04-10 DIAGNOSIS — G4733 Obstructive sleep apnea (adult) (pediatric): Secondary | ICD-10-CM

## 2024-04-10 LAB — NITRIC OXIDE: Nitric Oxide: 9

## 2024-04-10 NOTE — Progress Notes (Signed)
 Subjective:    Patient ID: [name], female    DOB: [birth year], 58 y.o.   MRN: [id number]  Patient Care Team: Elicia Hamlet, MD as PCP - General (Family Medicine) Tamea Dedra CROME, MD as Consulting Physician (Pulmonary Disease)  Chief Complaint  Patient presents with   Follow-up    Shortness of breath on exertion. Wearing CPAP nightly. No problems with mask or pressure. Patient has an old s-pap unit. No modem. Patient request new CPAP machine.     BACKGROUND/INTERVAL:[name] is a 58 year old former smoker with very minimal tobacco exposure in the past who presents for follow-up on the issue of moderate to severe persistent asthma with asthmatic bronchitis, cough and chronic rhinosinusitis.  This is a scheduled visit.  She was last evaluated on 03 January 2024.  She started Dupixent  for asthma control in January 2023.   HPI Discussed the use of AI scribe software for clinical note transcription with the patient, who gave verbal consent to proceed.  History of Present Illness   [name] is a 58 year old female with asthma and obstructive sleep apnea who presents for follow-up after surgery and issues with her CPAP machine.  She underwent umbilical hernia repair surgery on May 15th and attended a preoperative appointment where everything was deemed satisfactory. Since the surgery, she has experienced throat irritation, which she attributes to intubation during the procedure.  Her asthma symptoms have been exacerbated by recent rainy weather, leading to increased shortness of breath, especially when descending stairs or walking extensively. She has been using her Albuterol , Trelegy, and Dupixent  regularly to manage her symptoms.  She is experiencing issues with her CPAP machine, which has stopped connecting wirelessly.  Her compliance report is consistent with's due to known connectivity.  The machine, likely over five years old, was prescribed previously by Dr. Verdia.     Review  of Systems A 10 point review of systems was performed and it is as noted above otherwise negative.   Patient Active Problem List   Diagnosis Date Noted   Adjustment disorder with anxiety 01/18/2023   Pruritic intertrigo 04/18/2022   Hyperlipidemia due to type 2 diabetes mellitus (HCC) 11/27/2021   Chronic mixed headache syndrome 06/05/2021   Back pain of lumbosacral region with sciatica 06/08/2020   Scar of scalp 11/13/2019   Restless leg syndrome 09/05/2017   Chronic migraine without aura with status migrainosus, not intractable 07/26/2017   Type 2 diabetes mellitus with hyperglycemia (HCC) 10/03/2015   HTN (hypertension) 10/03/2015   Dyspnea 07/20/2013   Obstructive sleep apnea 09/08/2010   Hypercholesterolemia 09/20/2008   HOT FLASHES 09/20/2008   Depression with anxiety 12/26/2006   Asthma 12/26/2006    Social History   Tobacco Use   Smoking status: Former    Current packs/day: 0.00    Average packs/day: 0.3 packs/day for 15.0 years (3.8 ttl pk-yrs)    Types: Cigarettes    Start date: 02/16/1988    Quit date: 02/16/2003    Years since quitting: 21.1   Smokeless tobacco: Never  Substance Use Topics   Alcohol use: Yes    Alcohol/week: 3.0 standard drinks of alcohol    Types: 3 Glasses of wine per week    Allergies  Allergen Reactions   Amitriptyline     Abnormal behavior. Just doesn't feel like normal self    Fluticasone -Salmeterol Other (See Comments)    Thrush even with mouth rinsing; worsens cough   Imitrex  [Sumatriptan ]     Made her head  feel like its on fire   Penicillins Itching and Swelling    Has patient had a PCN reaction causing immediate rash, facial/tongue/throat swelling, SOB or lightheadedness with hypotension: No Has patient had a PCN reaction causing severe rash involving mucus membranes or skin necrosis: No Has patient had a PCN reaction that required hospitalization: No Has patient had a PCN reaction occurring within the last 10 years: Yes If all  of the above answers are NO, then may proceed with Cephalosporin use.    Ciprofloxacin Itching and Rash   Macrobid [Nitrofurantoin Monohyd Macro] Itching   Sulfamethoxazole-Trimethoprim Rash    Current Meds  Medication Sig   albuterol  (VENTOLIN  HFA) 108 (90 Base) MCG/ACT inhaler Inhale 2 puffs into the lungs every 6 (six) hours as needed for wheezing or shortness of breath.   azelastine  (ASTELIN ) 0.1 % nasal spray Place 1 spray into both nostrils 2 (two) times daily. Use in each nostril as directed   benzonatate  (TESSALON ) 200 MG capsule Take 1 capsule (200 mg total) by mouth 3 (three) times daily as needed.   BIOTIN PO Take 2,000 mg by mouth daily.   blood glucose meter kit and supplies KIT Use as directed.   busPIRone  (BUSPAR ) 5 MG tablet Take 1&1/2 tablets (7.5 mg total) by mouth 2 (two) times daily.   cetirizine  (ZYRTEC ) 10 MG tablet Take 1 tablet (10 mg total) by mouth daily.   Dulaglutide  (TRULICITY ) 3 MG/0.5ML SOAJ Inject 3 mg as directed once a week.   Dupilumab  (DUPIXENT ) 300 MG/2ML SOAJ Inject 300 mg into the skin every 14 (fourteen) days.   Elastic Bandages & Supports (MEDICAL COMPRESSION STOCKINGS) MISC 1 Application by Does not apply route daily.   esomeprazole  (NEXIUM ) 40 MG capsule Take 1 capsule (40 mg total) by mouth daily.   estradiol  (ESTRACE  VAGINAL) 0.1 MG/GM vaginal cream Place 1 g vaginally daily for 2 weeks, then use twice weekly thereafter.   fluorometholone (FML) 0.1 % ophthalmic suspension Place 1 drop into both eyes in the morning and at bedtime.   fluticasone  (FLONASE ) 50 MCG/ACT nasal spray Place 2 sprays into both nostrils daily.   Fluticasone -Umeclidin-Vilant (TRELEGY ELLIPTA ) 200-62.5-25 MCG/ACT AEPB Inhale 1 puff into the lungs daily.   gabapentin  (NEURONTIN ) 300 MG capsule Take 1 capsule (300 mg total) by mouth every 8 (eight) hours as needed for pain   Galcanezumab -gnlm (EMGALITY ) 120 MG/ML SOAJ Inject 120 mg into the skin every 30 (thirty) days.    glucose blood (FREESTYLE LITE) test strip Use as directed 2 times daily.   HYDROcodone -acetaminophen  (NORCO/VICODIN) 5-325 MG tablet Take 1-2 tablets by mouth every 6 (six) hours as needed for moderate (4-6) to severe (7-10) pain.   insulin  glargine (LANTUS  SOLOSTAR) 100 UNIT/ML Solostar Pen Inject 10 Units into the skin daily only while you are taking Prednisone  for COPD exacerbations. Once you finish the steroid, then stop taking the insulin    Lancets (FREESTYLE) lancets Use as directed 2 times daily.   levalbuterol  (XOPENEX ) 1.25 MG/3ML nebulizer solution Take 1.25 mg by nebulization every 8 (eight) hours as needed for wheezing or shortness of breath.   losartan -hydrochlorothiazide  (HYZAAR ) 100-25 MG tablet Take 1 tablet by mouth daily.   Multiple Vitamin (MULTIVITAMIN WITH MINERALS) TABS tablet Take 1 tablet by mouth daily.   naproxen  (NAPROSYN ) 500 MG tablet Take 1 tablet (500 mg total) by mouth 2 (two) times daily as needed for mild or moderate pain   nystatin  (NYAMYC ) powder Apply 1 application topically 3 (three) times daily.  ondansetron  (ZOFRAN ) 4 MG tablet Take 1 tablet (4 mg total) by mouth every 8 (eight) hours as needed for nausea or vomiting.   oxybutynin  (DITROPAN  XL) 15 MG 24 hr tablet Take 1 tablet (15 mg total) by mouth at bedtime.   phenazopyridine  (PYRIDIUM ) 200 MG tablet Take 1 tablet (200 mg total) by mouth 3 (three) times daily as needed for pain.   Polyethyl Glycol-Propyl Glycol (SYSTANE OP) Place 1 drop into both eyes in the morning and at bedtime.   Pyridoxine HCl (VITAMIN B-6 PO) Take 1 tablet by mouth daily. Unknown OTC strength   rosuvastatin  (CRESTOR ) 40 MG tablet Take 1 tablet (40 mg total) by mouth daily.   sertraline  (ZOLOFT ) 50 MG tablet Take 1 tablet (50 mg total) by mouth daily.   Spacer/Aero-Holding Chambers (AEROCHAMBER MV) inhaler Use as instructed   vitamin C (ASCORBIC ACID) 500 MG tablet Take 500 mg by mouth daily.    zolmitriptan  (ZOMIG ) 5 MG tablet Take  1 tablet (5 mg total) by mouth as needed for migraine.    Immunization History  Administered Date(s) Administered   Influenza Split 07/22/2016   Influenza Whole 07/28/2008   Influenza,inj,Quad PF,6+ Mos 07/29/2013, 07/13/2019   Influenza-Unspecified 08/12/2014, 07/29/2015, 07/28/2017, 07/29/2020, 07/26/2021, 08/02/2022, 06/30/2023   Moderna Sars-Covid-2 Vaccination 10/21/2019, 11/09/2019   PFIZER Comirnaty(Gray Top)Covid-19 Tri-Sucrose Vaccine 07/19/2020   Pneumococcal Polysaccharide-23 08/01/2016   Td 05/29/2002   Tdap 09/05/2016        Objective:     BP (!) 152/90 (BP Location: Right Arm, Patient Position: Sitting, Cuff Size: Normal)   Pulse 65   Temp 98.4 F (36.9 C) (Oral)   Ht 5' 1 (1.549 m)   Wt 199 lb 9.6 oz (90.5 kg)   LMP 05/21/2017 (Exact Date)   SpO2 98%   BMI 37.71 kg/m   SpO2: 98 %  GENERAL: Obese woman, no acute distress, fully ambulatory.  No conversational dyspnea. HEAD: Normocephalic, atraumatic.  EYES: Pupils equal, round, reactive to light.  No scleral icterus.  MOUTH: Dentition intact, oral mucosa moist. NECK: Thick neck, supple. No thyromegaly. Trachea midline. No JVD.  No adenopathy. PULMONARY: Good air entry bilaterally.  No adventitious sounds. CARDIOVASCULAR: S1 and S2. Regular rate and rhythm.  No rubs, murmurs or gallops heard. ABDOMEN: Obese. MUSCULOSKELETAL: No joint deformity, no clubbing, no edema.  NEUROLOGIC: No focal deficit, no gait disturbance, speech is fluent. SKIN: Intact,warm,dry. PSYCH: Mood and behavior normal.  Lab Results  Component Value Date   NITRICOXIDE 9 04/10/2024  *No evidence of type II inflammation       Assessment & Plan:     ICD-10-CM   1. Severe persistent asthma without complication  J45.50 Nitric oxide     2. Chronic rhinosinusitis  J32.9     3. OSA on CPAP  G47.33 AMB REFERRAL FOR DME      Orders Placed This Encounter  Procedures   AMB REFERRAL FOR DME    Referral Priority:   Routine     Referral Type:   Durable Medical Equipment Purchase    Number of Visits Requested:   1   Nitric oxide    Discussion:    Asthma Asthma exacerbation likely due to recent weather changes causing increased dyspnea on exertion. Regular use of Albuterol , Trelegy, and Dupixent . Recent surgery with intubation may have contributed to airway irritation, there is no evidence of true inflammation on nitric oxide  determination today. - Continue Albuterol  as needed for breakthrough asthma symptoms - Continue Trelegy 200 daily - Continue Dupixent   as prescribed  Obstructive Sleep Apnea Management complicated by CPAP machine connectivity issues. The machine is over 22 years old and may require replacement. She will contact [facility] for potential replacement. - Contact [facility] to address CPAP machine connectivity issues - Order new CPAP machine if necessary       Advised if symptoms do not improve or worsen, to please contact office for sooner follow up or seek emergency care.    I spent 32 minutes of dedicated to the care of this patient on the date of this encounter to include pre-visit review of records, face-to-face time with the patient discussing conditions above, post visit ordering of testing, clinical documentation with the electronic health record, making appropriate referrals as documented, and communicating necessary findings to members of the patients care team.     C. Leita Sanders, MD Advanced Bronchoscopy PCCM [facility]    *This note was generated using voice recognition software/Dragon and/or AI transcription program.  Despite best efforts to proofread, errors can occur which can change the meaning. Any transcriptional errors that result from this process are unintentional and may not be fully corrected at the time of dictation.

## 2024-04-10 NOTE — Patient Instructions (Signed)
 VISIT SUMMARY:  You came in today for a follow-up after your recent surgery and to discuss issues with your CPAP machine. You mentioned experiencing throat irritation since the surgery, likely due to intubation, and increased asthma symptoms due to the rainy weather. We also discussed the connectivity issues with your CPAP machine, which is over 57 years old.  YOUR PLAN:  -ASTHMA: Asthma is a condition where your airways become inflamed and narrow, making it hard to breathe. Your recent symptoms are likely due to the rainy weather. Continue using Albuterol  as needed, Trelegy daily, and Dupixent  as prescribed to manage your symptoms.  -OBSTRUCTIVE SLEEP APNEA: Obstructive Sleep Apnea is a condition where your breathing stops and starts during sleep. Your CPAP machine, which helps manage this condition, is having connectivity issues and may need to be replaced. Please contact [facility] to address these issues and order a new machine if necessary.  INSTRUCTIONS:  Please contact [facility] to address the connectivity issues with your CPAP machine and order a new one if necessary.  We will see you in follow-up in 4 months time.

## 2024-04-20 ENCOUNTER — Telehealth: Payer: Self-pay

## 2024-04-20 ENCOUNTER — Other Ambulatory Visit (HOSPITAL_COMMUNITY): Payer: Self-pay

## 2024-04-20 DIAGNOSIS — J209 Acute bronchitis, unspecified: Secondary | ICD-10-CM

## 2024-04-20 NOTE — Telephone Encounter (Signed)
 Copied from CRM [id number]. Topic: Clinical - Medication Question >> Apr 20, 2024  2:43 PM Russell PARAS wrote: Reason for CRM:   Pt is contacting clinic regarding request for azithromycin . She just returned from out of town and is experiencing some mild dry coughing. Reports provider will typically prescribe a Z-pak and she has used all of her refills. Requesting medication be sent to [facility] on file if possible.   CB#  [phone]

## 2024-04-21 MED ORDER — AZITHROMYCIN 250 MG PO TABS
ORAL_TABLET | ORAL | 0 refills | Status: AC
Start: 2024-04-21 — End: 2024-04-26

## 2024-04-21 NOTE — Telephone Encounter (Signed)
 Prescription for azithromycin  sent to her pharmacy of choice.  She likely has bronchitis.  If her symptoms worsen she should be seen at either urgent care or ED.

## 2024-04-21 NOTE — Telephone Encounter (Signed)
 I have notified the patient. Nothing further needed.

## 2024-04-21 NOTE — Telephone Encounter (Signed)
 How long have your symptoms been going on for? Since last Friday Any fevers, chills or sweats? Chills, no fever Any cough? If so are you getting anything up? What color? Cough with white sputum Any increased SOB? No Any wheezing? Little Wheezing Sore throat since hernia repair on 03/12/2024 Has not tested for Covid and said she does not have Covid.  What medications do you take? Ventolin - BID Dupixent  Trelegy- once a day Mucinex - BID Tessalon - BID

## 2024-05-03 ENCOUNTER — Other Ambulatory Visit: Payer: Self-pay

## 2024-05-03 ENCOUNTER — Emergency Department (HOSPITAL_BASED_OUTPATIENT_CLINIC_OR_DEPARTMENT_OTHER)

## 2024-05-03 ENCOUNTER — Encounter (HOSPITAL_BASED_OUTPATIENT_CLINIC_OR_DEPARTMENT_OTHER): Payer: Self-pay | Admitting: Emergency Medicine

## 2024-05-03 ENCOUNTER — Emergency Department (HOSPITAL_BASED_OUTPATIENT_CLINIC_OR_DEPARTMENT_OTHER): Admission: EM | Admit: 2024-05-03 | Discharge: 2024-05-03 | Disposition: A | Discharge status: Home / Self Care

## 2024-05-03 DIAGNOSIS — J441 Chronic obstructive pulmonary disease with (acute) exacerbation: Secondary | ICD-10-CM | POA: Insufficient documentation

## 2024-05-03 DIAGNOSIS — Z79899 Other long term (current) drug therapy: Secondary | ICD-10-CM | POA: Diagnosis not present

## 2024-05-03 DIAGNOSIS — Z7951 Long term (current) use of inhaled steroids: Secondary | ICD-10-CM | POA: Diagnosis not present

## 2024-05-03 DIAGNOSIS — R1310 Dysphagia, unspecified: Secondary | ICD-10-CM | POA: Diagnosis not present

## 2024-05-03 DIAGNOSIS — I10 Essential (primary) hypertension: Secondary | ICD-10-CM | POA: Insufficient documentation

## 2024-05-03 DIAGNOSIS — R070 Pain in throat: Secondary | ICD-10-CM | POA: Diagnosis present

## 2024-05-03 LAB — RESP PANEL BY RT-PCR (RSV, FLU A&B, COVID)  RVPGX2
Influenza A by PCR: NEGATIVE
Influenza B by PCR: NEGATIVE
Resp Syncytial Virus by PCR: NEGATIVE
SARS Coronavirus 2 by RT PCR: NEGATIVE

## 2024-05-03 LAB — BASIC METABOLIC PANEL WITH GFR
Anion gap: 12 (ref 5–15)
BUN: 12 mg/dL (ref 6–20)
CO2: 27 mmol/L (ref 22–32)
Calcium: 9.4 mg/dL (ref 8.9–10.3)
Chloride: 103 mmol/L (ref 98–111)
Creatinine, Ser: 0.83 mg/dL (ref 0.44–1.00)
GFR, Estimated: >60 mL/min (ref >60)
Glucose, Bld: 130 mg/dL — ABNORMAL HIGH (ref 70–99)
Potassium: 3.5 mmol/L (ref 3.5–5.1)
Sodium: 142 mmol/L (ref 135–145)

## 2024-05-03 LAB — CBC
HCT: 36.8 % (ref 36.0–46.0)
Hemoglobin: 12.3 g/dL (ref 12.0–15.0)
MCH: 30.1 pg (ref 26.0–34.0)
MCHC: 33.4 g/dL (ref 30.0–36.0)
MCV: 90.2 fL (ref 80.0–100.0)
Platelets: 234 K/uL (ref 150–400)
RBC: 4.08 MIL/uL (ref 3.87–5.11)
RDW: 12.8 % (ref 11.5–15.5)
WBC: 4 K/uL (ref 4.0–10.5)
nRBC: 0 % (ref 0.0–0.2)

## 2024-05-03 MED ORDER — PREDNISONE 50 MG PO TABS
50.0000 mg | ORAL_TABLET | Freq: Once | ORAL | Status: AC
  Administered 2024-05-03: 50 mg via ORAL
  Filled 2024-05-03: qty 1

## 2024-05-03 MED ORDER — PREDNISONE 50 MG PO TABS: 50.0000 mg | ORAL_TABLET | Freq: Every day | ORAL | 0 refills | Status: DC

## 2024-05-03 NOTE — ED Notes (Signed)
 Discharge instructions reviewed with patient. Patient verbalizes understanding, no further questions at this time. Medications/prescriptions and follow up information provided. No acute distress noted at time of departure.
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
## 2024-05-03 NOTE — Discharge Instructions (Signed)
 As we discussed, your labs and xrays are good. Take the prednisone  daily for the next 2 days - your next dose is tomorrow, final dose is Tuesday.  See your doctor for recheck in another several days. Be sure to check your blood sugar frequent (2-3 times daily) to watch for any significant rise.

## 2024-05-03 NOTE — ED Notes (Signed)
 RT assessed in triage. BBS clear, but patient is taking more shallow breaths due to pain. V/S WNL.
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
## 2024-05-03 NOTE — ED Notes (Signed)
 ED Provider at bedside.

## 2024-05-11 ENCOUNTER — Encounter: Payer: Self-pay | Admitting: *Deleted

## 2024-05-12 MED ORDER — AZITHROMYCIN 250 MG PO TABS: ORAL_TABLET | ORAL | 0 refills | Status: DC

## 2024-05-12 MED ORDER — BENZONATATE 200 MG PO CAPS: 200.0000 mg | ORAL_CAPSULE | Freq: Three times a day (TID) | ORAL | 3 refills | Status: AC | PRN

## 2024-05-12 NOTE — Addendum Note (Signed)
 Addended by: TAMEA DEDRA CROME on: 05/12/2024 04:32 PM   Modules accepted: Orders

## 2024-05-12 NOTE — Telephone Encounter (Signed)
 I sent a prescription of azithromycin  and Tessalon  [place].  I read the report of her visit to the ED.  I think that these 2 things will help her with her cough.  If they do not have her call us  back.

## 2024-06-02 ENCOUNTER — Other Ambulatory Visit: Payer: Self-pay | Admitting: Family Medicine

## 2024-06-02 ENCOUNTER — Other Ambulatory Visit (HOSPITAL_COMMUNITY): Payer: Self-pay

## 2024-06-02 ENCOUNTER — Other Ambulatory Visit: Payer: Self-pay | Admitting: Pulmonary Disease

## 2024-06-02 ENCOUNTER — Other Ambulatory Visit: Payer: Self-pay

## 2024-06-02 DIAGNOSIS — R350 Frequency of micturition: Secondary | ICD-10-CM

## 2024-06-02 DIAGNOSIS — I10 Essential (primary) hypertension: Secondary | ICD-10-CM

## 2024-06-02 DIAGNOSIS — E1165 Type 2 diabetes mellitus with hyperglycemia: Secondary | ICD-10-CM

## 2024-06-02 MED ORDER — ALBUTEROL SULFATE HFA 108 (90 BASE) MCG/ACT IN AERS
2.0000 | INHALATION_SPRAY | Freq: Four times a day (QID) | RESPIRATORY_TRACT | 6 refills | Status: DC | PRN
  Filled 2024-06-02: qty 6.7, 25d supply, fill #0
  Filled 2024-07-16: qty 6.7, 25d supply, fill #1
  Filled 2024-08-25: qty 6.7, 25d supply, fill #2
  Filled 2024-09-29: qty 6.7, 25d supply, fill #3

## 2024-06-03 MED ORDER — LOSARTAN POTASSIUM-HCTZ 100-25 MG PO TABS
1.0000 | ORAL_TABLET | Freq: Every day | ORAL | 1 refills | Status: DC
  Filled 2024-06-03 – 2024-08-25 (×2): qty 90, 90d supply, fill #0

## 2024-06-03 MED ORDER — TRULICITY 3 MG/0.5ML ~~LOC~~ SOAJ
3.0000 mg | SUBCUTANEOUS | 2 refills | Status: DC
  Filled 2024-06-03 – 2024-07-16 (×2): qty 2, 28d supply, fill #0
  Filled 2024-08-25: qty 2, 28d supply, fill #1

## 2024-06-03 MED ORDER — OXYBUTYNIN CHLORIDE ER 15 MG PO TB24
15.0000 mg | ORAL_TABLET | Freq: Every day | ORAL | 1 refills | Status: DC
  Filled 2024-06-03 – 2024-07-16 (×2): qty 90, 90d supply, fill #0
  Filled 2024-09-29: qty 90, 90d supply, fill #1

## 2024-06-04 ENCOUNTER — Other Ambulatory Visit: Payer: Self-pay

## 2024-06-04 ENCOUNTER — Other Ambulatory Visit (HOSPITAL_COMMUNITY): Payer: Self-pay

## 2024-06-15 ENCOUNTER — Other Ambulatory Visit (HOSPITAL_COMMUNITY): Payer: Self-pay

## 2024-06-16 ENCOUNTER — Other Ambulatory Visit (HOSPITAL_COMMUNITY): Payer: Self-pay

## 2024-07-01 ENCOUNTER — Telehealth: Payer: Self-pay

## 2024-07-01 NOTE — Telephone Encounter (Signed)
 Pharmacy Patient Advocate Encounter   Received notification from Fax that prior authorization for Emgality  is required/requested.   Insurance verification completed.   The patient is insured through [employer] .   Per test claim: PA required; PA submitted to above mentioned insurance via Latent Key/confirmation #/EOC [id number] Status is pending

## 2024-07-02 ENCOUNTER — Other Ambulatory Visit (HOSPITAL_COMMUNITY): Payer: Self-pay

## 2024-07-02 NOTE — Telephone Encounter (Signed)
 Pharmacy Patient Advocate Encounter  Received notification from [employer] that Prior Authorization for Emgality  has been APPROVED from 07/01/2024 to 12/29/2024. Ran test claim, Copay is $0. This test claim was processed through [facility]- copay amounts may vary at other pharmacies due to pharmacy/plan contracts, or as the patient moves through the different stages of their insurance plan.   PA #/Case ID/Reference #: [id number]

## 2024-07-16 ENCOUNTER — Other Ambulatory Visit (HOSPITAL_COMMUNITY): Payer: Self-pay

## 2024-07-20 ENCOUNTER — Other Ambulatory Visit: Payer: Self-pay

## 2024-07-24 ENCOUNTER — Ambulatory Visit: Admitting: Family Medicine

## 2024-07-30 ENCOUNTER — Encounter: Payer: Self-pay | Admitting: Family Medicine

## 2024-07-30 ENCOUNTER — Ambulatory Visit: Admitting: Family Medicine

## 2024-07-30 VITALS — BP 154/94 | HR 69 | Ht 61.0 in | Wt 204.4 lb

## 2024-07-30 DIAGNOSIS — R110 Nausea: Secondary | ICD-10-CM

## 2024-07-30 DIAGNOSIS — E1165 Type 2 diabetes mellitus with hyperglycemia: Secondary | ICD-10-CM

## 2024-07-30 DIAGNOSIS — I10 Essential (primary) hypertension: Secondary | ICD-10-CM | POA: Diagnosis not present

## 2024-07-30 LAB — POCT GLYCOSYLATED HEMOGLOBIN (HGB A1C): HbA1c, POC (controlled diabetic range): 7.6 % — AB (ref 0.0–7.0)

## 2024-07-30 NOTE — Assessment & Plan Note (Signed)
 Due for A1c. Advised to f/u in 2 weeks.

## 2024-07-30 NOTE — Patient Instructions (Signed)
 Good to see you today - Thank you for coming in  Things we discussed today:  1) Your nausea and constipation is most likely from a stomach infection. These usually go away in 5-7 days. - Make sure you are drinking plenty of water to stay hydrated. Even if you can't eat, make sure you are drinking. - Skip this week's dose of Trulicity . This can make your stomach symtpoms worse. Wait to restart your trulicity  in 2 weeks.  2) Come back to see me in 2-3 weeks. We will review your labs and recheck your blood pressure to see if we need to make adjustments.    3) Call your insurance company and ask to see if your current doctors are in network. If not, we can help you find new doctors.

## 2024-07-30 NOTE — Progress Notes (Signed)
    SUBJECTIVE:   CHIEF COMPLAINT / HPI:   IG is a 58yo F that pf nausea - last Saturday after brunch, just started to feel very nauseous and tired - She reports feeling tired about 2 weeks prior - She also took trulicity  that morning, which sometimes causes her stomach upset - Also reports constipation during this time, - She would also get headache after she gags - She reports some non-bloody vomit - She took a nausea med (zofran ?) which helped with her nausea - Also reports abdominal cramping - Denies fevers, sick contacts, travel - She took a laxative to help with her constipation which helped. - No blood in stool - Denies dysuria  - Symptoms are improving and pt is starting to tolerate eating more yesterday.   PERTINENT  PMH / PSH: T2DM, anxiety  OBJECTIVE:   BP (!) 154/94   Pulse 69   Ht 5' 1 (1.549 m)   Wt 204 lb 6.4 oz (92.7 kg)   LMP 05/21/2017 (Exact Date)   SpO2 97%   BMI 38.62 kg/m   General: Alert, pleasant woman. NAD. HEENT: NCAT. MMM. CV: RRR, no murmurs. Cap refill <2. Resp: CTAB, no wheezing or crackles. Normal WOB on RA.  Abm: Soft, nontender, nondistended. BS present. Ext: Moves all ext spontaneously Skin: Warm, well perfused   ASSESSMENT/PLAN:   Assessment & Plan Nausea Most likely infectious gastro, which was exacerbated by trulicity  side effect. Symptoms starting to improve and pt is tolerating PO well. Abm exam benign, no red flag signs such as guarding, rigidity, or rebound. No weight loss or fevers.  - Advised supportive care and hydration - Hold trulicity  for 1 week, restart in 2 weeks - CBC, BMP  Primary hypertension Uncontrolled, but pt is actively sick and has not been taking meds consistently due to nausea. Plan to f/u and recheck BP. - BMP Type 2 diabetes mellitus with hyperglycemia, without long-term current use of insulin  (HCC) Due for A1c. Advised to f/u in 2 weeks.      [name], MD [facility]

## 2024-07-30 NOTE — Assessment & Plan Note (Signed)
 Uncontrolled, but pt is actively sick and has not been taking meds consistently due to nausea. Plan to f/u and recheck BP. - BMP

## 2024-07-31 LAB — CBC
Hematocrit: 40.1 % (ref 34.0–46.6)
Hemoglobin: 13.1 g/dL (ref 11.1–15.9)
MCH: 29.9 pg (ref 26.6–33.0)
MCHC: 32.7 g/dL (ref 31.5–35.7)
MCV: 92 fL (ref 79–97)
Platelets: 285 x10E3/uL (ref 150–450)
RBC: 4.38 x10E6/uL (ref 3.77–5.28)
RDW: 12.4 % (ref 11.7–15.4)
WBC: 4.9 x10E3/uL (ref 3.4–10.8)

## 2024-07-31 LAB — BASIC METABOLIC PANEL WITH GFR
BUN/Creatinine Ratio: 18 (ref 9–23)
BUN: 15 mg/dL (ref 6–24)
CO2: 22 mmol/L (ref 20–29)
Calcium: 9.9 mg/dL (ref 8.7–10.2)
Chloride: 104 mmol/L (ref 96–106)
Creatinine, Ser: 0.84 mg/dL (ref 0.57–1.00)
Glucose: 120 mg/dL — ABNORMAL HIGH (ref 70–99)
Potassium: 4.2 mmol/L (ref 3.5–5.2)
Sodium: 145 mmol/L — ABNORMAL HIGH (ref 134–144)
eGFR: 81 mL/min/1.73 (ref >59)

## 2024-08-03 ENCOUNTER — Ambulatory Visit: Payer: Self-pay | Admitting: Family Medicine

## 2024-08-12 ENCOUNTER — Ambulatory Visit: Admitting: Pulmonary Disease

## 2024-08-25 ENCOUNTER — Other Ambulatory Visit: Payer: Self-pay | Admitting: Family Medicine

## 2024-08-25 ENCOUNTER — Ambulatory Visit: Admitting: Pulmonary Disease

## 2024-08-25 ENCOUNTER — Other Ambulatory Visit (HOSPITAL_COMMUNITY): Payer: Self-pay

## 2024-08-25 ENCOUNTER — Encounter: Payer: Self-pay | Admitting: Pulmonary Disease

## 2024-08-25 ENCOUNTER — Other Ambulatory Visit: Payer: Self-pay

## 2024-08-25 VITALS — BP 112/76 | HR 72 | Temp 98.1°F | Ht 61.0 in | Wt 206.8 lb

## 2024-08-25 DIAGNOSIS — E669 Obesity, unspecified: Secondary | ICD-10-CM

## 2024-08-25 DIAGNOSIS — J329 Chronic sinusitis, unspecified: Secondary | ICD-10-CM

## 2024-08-25 DIAGNOSIS — J4540 Moderate persistent asthma, uncomplicated: Secondary | ICD-10-CM

## 2024-08-25 DIAGNOSIS — G4733 Obstructive sleep apnea (adult) (pediatric): Secondary | ICD-10-CM

## 2024-08-25 DIAGNOSIS — L304 Erythema intertrigo: Secondary | ICD-10-CM

## 2024-08-25 DIAGNOSIS — Z6839 Body mass index (BMI) 39.0-39.9, adult: Secondary | ICD-10-CM

## 2024-08-25 MED ORDER — AZITHROMYCIN 250 MG PO TABS
ORAL_TABLET | ORAL | 0 refills | Status: AC
  Filled 2024-08-25: qty 6, 5d supply, fill #0

## 2024-08-25 NOTE — Patient Instructions (Signed)
 VISIT SUMMARY:  Today, we discussed your increased use of the rescue inhaler due to weather changes, weight gain and fluid retention, and your obstructive sleep apnea. We reviewed your current medications and made some adjustments to help manage your symptoms more effectively.  YOUR PLAN:  -ASTHMA: Asthma is a condition where your airways narrow and swell, producing extra mucus, which can make breathing difficult. Your asthma seems to be exacerbated by weather changes and overexertion. Continue using Dupixent  and Trelegy as they are helping manage your symptoms. A prescription for Z-Pak has been provided for use if your symptoms worsen.  -WEIGHT GAIN AND FLUID RETENTION: Weight gain and fluid retention can cause swelling and may contribute to breathing difficulties. You will continue using Trulicity  to help with weight management and the increased dose of your blood pressure medication, which includes hydrochlorothiazide , to help reduce fluid retention.  -OBSTRUCTIVE SLEEP APNEA: Obstructive sleep apnea is a condition where your breathing repeatedly stops and starts during sleep. You are currently using a CPAP machine to manage this condition, but it is not recording data and may soon fail. You will need to obtain a new CPAP machine once your insurance through [employer] is active.  INSTRUCTIONS:  Please continue using your current medications as prescribed. If your asthma symptoms worsen, use the Z-Pak as directed. Once your new insurance is active, obtain a replacement CPAP machine. Follow up with us  if you have any concerns or if your symptoms do not improve.

## 2024-08-25 NOTE — Progress Notes (Signed)
 Subjective:    Patient ID: [name], female    DOB: 01-01-1966, 58 y.o.   MRN: [id number]  Patient Care Team: Elicia Hamlet, MD as PCP - General (Family Medicine) Tamea Dedra CROME, MD as Consulting Physician (Pulmonary Disease)  Chief Complaint  Patient presents with   Asthma    BACKGROUND/INTERVAL:[name] is a 58 year old former smoker with very minimal tobacco exposure in the past who presents for follow-up on the issue of moderate to severe persistent asthma with asthmatic bronchitis, cough and chronic rhinosinusitis. This is a scheduled visit.  She was last evaluated on 10 April 2024.  She started Dupixent  for asthma control in January 2023.   HPI Discussed the use of AI scribe software for clinical note transcription with the patient, who gave verbal consent to proceed.  History of Present Illness   [name] is a 58 year old female with asthma and obstructive sleep apnea who presents with increased use of rescue inhaler due to weather changes.  She experiences increased use of her rescue inhaler, particularly during weather changes from warm to cool or on rainy days. Symptoms also occur with overexertion, which she manages by resting. She continues to use Dupixent  and Trelegy, which she finds helpful in managing her asthma.  She had to use her rescue inhaler 3 times yesterday but so far today has not had to use.  She has obstructive sleep apnea and uses a CPAP machine, which is currently not recording data.  She states she has been compliant with the device.  She is due for a new device however, she wants to wait till her new insurance kicks in to get it.  Since her surgery in May for umbilical hernia repair, she has gained weight, increasing from 195 to 206 pounds. She suspects fluid retention, as indicated by swelling in her fingers and feet. Her blood pressure medication, which includes hydrochlorothiazide , was increased to address this, and she reports feeling better. She is  also on Trulicity  to aid in weight loss.  She received a flu shot at the beginning of October.      Review of Systems A 10 point review of systems was performed and it is as noted above otherwise negative.   Patient Active Problem List   Diagnosis Date Noted   Adjustment disorder with anxiety 01/18/2023   Pruritic intertrigo 04/18/2022   Hyperlipidemia due to type 2 diabetes mellitus (HCC) 11/27/2021   Chronic mixed headache syndrome 06/05/2021   Back pain of lumbosacral region with sciatica 06/08/2020   Scar of scalp 11/13/2019   Restless leg syndrome 09/05/2017   Chronic migraine without aura with status migrainosus, not intractable 07/26/2017   Type 2 diabetes mellitus with hyperglycemia (HCC) 10/03/2015   HTN (hypertension) 10/03/2015   Dyspnea 07/20/2013   Obstructive sleep apnea 09/08/2010   Hypercholesterolemia 09/20/2008   HOT FLASHES 09/20/2008   Depression with anxiety 12/26/2006   Asthma 12/26/2006    Social History   Tobacco Use   Smoking status: Former    Current packs/day: 0.00    Average packs/day: 0.3 packs/day for 15.0 years (3.8 ttl pk-yrs)    Types: Cigarettes    Start date: 02/16/1988    Quit date: 02/16/2003    Years since quitting: 21.5   Smokeless tobacco: Never  Substance Use Topics   Alcohol use: Yes    Alcohol/week: 3.0 standard drinks of alcohol    Types: 3 Glasses of wine per week    Allergies  Allergen Reactions  Amitriptyline     Abnormal behavior. Just doesn't feel like normal self    Fluticasone -Salmeterol Other (See Comments)    Thrush even with mouth rinsing; worsens cough   Imitrex  [Sumatriptan ]     Made her head feel like its on fire   Penicillins Itching and Swelling    Has patient had a PCN reaction causing immediate rash, facial/tongue/throat swelling, SOB or lightheadedness with hypotension: No Has patient had a PCN reaction causing severe rash involving mucus membranes or skin necrosis: No Has patient had a PCN reaction  that required hospitalization: No Has patient had a PCN reaction occurring within the last 10 years: Yes If all of the above answers are NO, then may proceed with Cephalosporin use.    Ciprofloxacin Itching and Rash   Macrobid [Nitrofurantoin Monohyd Macro] Itching   Sulfamethoxazole-Trimethoprim Rash    Current Meds  Medication Sig   albuterol  (VENTOLIN  HFA) 108 (90 Base) MCG/ACT inhaler Inhale 2 puffs into the lungs every 6 (six) hours as needed for wheezing or shortness of breath.   azelastine  (ASTELIN ) 0.1 % nasal spray Place 1 spray into both nostrils 2 (two) times daily. Use in each nostril as directed   azithromycin  (ZITHROMAX ) 250 MG tablet Take 2 tablets (500 mg) on  Day 1,  followed by 1 tablet (250 mg) once daily on Days 2 through 5.   benzonatate  (TESSALON ) 200 MG capsule Take 1 capsule (200 mg total) by mouth 3 (three) times daily as needed.   BIOTIN PO Take 2,000 mg by mouth daily.   blood glucose meter kit and supplies KIT Use as directed.   busPIRone  (BUSPAR ) 5 MG tablet Take 1&1/2 tablets (7.5 mg total) by mouth 2 (two) times daily.   cetirizine  (ZYRTEC ) 10 MG tablet Take 1 tablet (10 mg total) by mouth daily.   Dulaglutide  (TRULICITY ) 3 MG/0.5ML SOAJ Inject 3 mg as directed once a week.   Dupilumab  (DUPIXENT ) 300 MG/2ML SOAJ Inject 300 mg into the skin every 14 (fourteen) days.   Elastic Bandages & Supports (MEDICAL COMPRESSION STOCKINGS) MISC 1 Application by Does not apply route daily.   esomeprazole  (NEXIUM ) 40 MG capsule Take 1 capsule (40 mg total) by mouth daily.   fluorometholone (FML) 0.1 % ophthalmic suspension Place 1 drop into both eyes in the morning and at bedtime.   fluticasone  (FLONASE ) 50 MCG/ACT nasal spray Place 2 sprays into both nostrils daily.   Fluticasone -Umeclidin-Vilant (TRELEGY ELLIPTA ) 200-62.5-25 MCG/ACT AEPB Inhale 1 puff into the lungs daily.   gabapentin  (NEURONTIN ) 300 MG capsule Take 1 capsule (300 mg total) by mouth every 8 (eight) hours  as needed for pain   Galcanezumab -gnlm (EMGALITY ) 120 MG/ML SOAJ Inject 120 mg into the skin every 30 (thirty) days.   glucose blood (FREESTYLE LITE) test strip Use as directed 2 times daily.   insulin  glargine (LANTUS  SOLOSTAR) 100 UNIT/ML Solostar Pen Inject 10 Units into the skin daily only while you are taking Prednisone  for COPD exacerbations. Once you finish the steroid, then stop taking the insulin    Lancets (FREESTYLE) lancets Use as directed 2 times daily.   levalbuterol  (XOPENEX ) 1.25 MG/3ML nebulizer solution Take 1.25 mg by nebulization every 8 (eight) hours as needed for wheezing or shortness of breath.   losartan -hydrochlorothiazide  (HYZAAR ) 100-25 MG tablet Take 1 tablet by mouth daily.   Multiple Vitamin (MULTIVITAMIN WITH MINERALS) TABS tablet Take 1 tablet by mouth daily.   naproxen  (NAPROSYN ) 500 MG tablet Take 1 tablet (500 mg total) by mouth 2 (two)  times daily as needed for mild or moderate pain   nystatin  (NYAMYC ) powder Apply 1 application topically 3 (three) times daily.   ondansetron  (ZOFRAN ) 4 MG tablet Take 1 tablet (4 mg total) by mouth every 8 (eight) hours as needed for nausea or vomiting.   oxybutynin  (DITROPAN  XL) 15 MG 24 hr tablet Take 1 tablet (15 mg total) by mouth at bedtime.   phenazopyridine  (PYRIDIUM ) 200 MG tablet Take 1 tablet (200 mg total) by mouth 3 (three) times daily as needed for pain.   Polyethyl Glycol-Propyl Glycol (SYSTANE OP) Place 1 drop into both eyes in the morning and at bedtime.   Pyridoxine HCl (VITAMIN B-6 PO) Take 1 tablet by mouth daily. Unknown OTC strength   rosuvastatin  (CRESTOR ) 40 MG tablet Take 1 tablet (40 mg total) by mouth daily.   sertraline  (ZOLOFT ) 50 MG tablet Take 1 tablet (50 mg total) by mouth daily.   Spacer/Aero-Holding Chambers (AEROCHAMBER MV) inhaler Use as instructed   vitamin C (ASCORBIC ACID) 500 MG tablet Take 500 mg by mouth daily.    zolmitriptan  (ZOMIG ) 5 MG tablet Take 1 tablet (5 mg total) by mouth as  needed for migraine.    Immunization History  Administered Date(s) Administered   Influenza Split 07/22/2016   Influenza Whole 07/28/2008   Influenza,inj,Quad PF,6+ Mos 07/29/2013, 07/13/2019   Influenza-Unspecified 08/12/2014, 07/29/2015, 07/28/2017, 07/29/2020, 07/26/2021, 08/02/2022, 06/30/2023, 07/29/2024   Moderna Sars-Covid-2 Vaccination 10/21/2019, 11/09/2019   PFIZER Comirnaty(Gray Top)Covid-19 Tri-Sucrose Vaccine 07/19/2020   Pneumococcal Polysaccharide-23 08/01/2016   Td 05/29/2002   Tdap 09/05/2016        Objective:     BP 112/76   Pulse 72   Temp 98.1 F (36.7 C) (Temporal)   Ht 5' 1 (1.549 m)   Wt 206 lb 12.8 oz (93.8 kg)   LMP 05/21/2017 (Exact Date)   SpO2 97%   BMI 39.07 kg/m   SpO2: 97 %  GENERAL: Obese woman, no acute distress, fully ambulatory.  No conversational dyspnea. HEAD: Normocephalic, atraumatic.  EYES: Pupils equal, round, reactive to light.  No scleral icterus.  MOUTH: Dentition intact, oral mucosa moist. NECK: Thick neck, supple. No thyromegaly. Trachea midline. No JVD.  No adenopathy. PULMONARY: Good air entry bilaterally.  No adventitious sounds. CARDIOVASCULAR: S1 and S2. Regular rate and rhythm.  No rubs, murmurs or gallops heard. ABDOMEN: Obese. MUSCULOSKELETAL: No joint deformity, no clubbing, no edema.  NEUROLOGIC: No focal deficit, no gait disturbance, speech is fluent. SKIN: Intact,warm,dry. PSYCH: Mood and behavior normal.        Assessment & Plan:     ICD-10-CM   1. Moderate persistent asthma in adult without complication  J45.40     2. Chronic rhinosinusitis  J32.9     3. OSA on CPAP  G47.33     4. Obesity with body mass index (BMI) of 30.0 to 39.9  E66.9       Meds ordered this encounter  Medications   azithromycin  (ZITHROMAX ) 250 MG tablet    Sig: Take 2 tablets (500 mg) on  Day 1,  followed by 1 tablet (250 mg) once daily on Days 2 through 5.    Dispense:  6 each    Refill:  0   Discussion:     Asthma Asthma exacerbation likely triggered by weather changes and overexertion, with increased use of rescue inhaler during rainy and cool weather. Currently managed with Dupixent  and Trelegy. - Continue Dupixent  and Trelegy. - Continue as needed albuterol  - Provide prescription for  Z-Pak for use if symptoms worsen (patient likes to keep on hand).  Weight gain and fluid retention Recent weight gain and fluid retention possibly contributing to breathing difficulties. Managed with Trulicity  for weight management and increased dose of blood pressure medication with hydrochlorothiazide  for fluid retention. - Continue Trulicity  for weight management. - Continue increased dose of blood pressure medication with hydrochlorothiazide .  Obstructive sleep apnea Obstructive sleep apnea managed with CPAP. Current CPAP machine is not recording data and may soon fail. Awaiting new insurance to obtain a replacement CPAP machine. - Obtain new CPAP machine once insurance through [place] is active.     Will see the patient in follow-up in 4 months time call sooner should any problems arise.  Advised if symptoms do not improve or worsen, to please contact office for sooner follow up or seek emergency care.    I spent 30 minutes of dedicated to the care of this patient on the date of this encounter to include pre-visit review of records, face-to-face time with the patient discussing conditions above, post visit ordering of testing, clinical documentation with the electronic health record, making appropriate referrals as documented, and communicating necessary findings to members of the patients care team.     C. Leita Sanders, MD [facility]  [facility]    *This note was generated using voice recognition software/Dragon and/or AI transcription program.  Despite best efforts to proofread, errors can occur which can change the meaning. Any transcriptional errors that result  from this process are unintentional and may not be fully corrected at the time of dictation.

## 2024-08-26 ENCOUNTER — Encounter (HOSPITAL_COMMUNITY): Payer: Self-pay

## 2024-08-26 ENCOUNTER — Other Ambulatory Visit (HOSPITAL_COMMUNITY): Payer: Self-pay

## 2024-08-26 MED ORDER — NYSTATIN 100000 UNIT/GM EX POWD
1.0000 | Freq: Three times a day (TID) | CUTANEOUS | 3 refills | Status: DC
  Filled 2024-08-26: qty 60, 15d supply, fill #0
  Filled 2024-09-29: qty 60, 15d supply, fill #1
  Filled 2024-09-30: qty 60, 30d supply, fill #1

## 2024-08-27 ENCOUNTER — Other Ambulatory Visit (HOSPITAL_COMMUNITY): Payer: Self-pay

## 2024-08-31 ENCOUNTER — Other Ambulatory Visit (HOSPITAL_COMMUNITY): Payer: Self-pay

## 2024-08-31 ENCOUNTER — Encounter: Payer: Self-pay | Admitting: Family Medicine

## 2024-08-31 ENCOUNTER — Ambulatory Visit (INDEPENDENT_AMBULATORY_CARE_PROVIDER_SITE_OTHER): Admitting: Family Medicine

## 2024-08-31 VITALS — BP 160/90 | HR 64 | Ht 61.0 in | Wt 207.4 lb

## 2024-08-31 DIAGNOSIS — E1165 Type 2 diabetes mellitus with hyperglycemia: Secondary | ICD-10-CM | POA: Diagnosis not present

## 2024-08-31 DIAGNOSIS — M25511 Pain in right shoulder: Secondary | ICD-10-CM

## 2024-08-31 DIAGNOSIS — I10 Essential (primary) hypertension: Secondary | ICD-10-CM

## 2024-08-31 MED ORDER — ACCU-CHEK SOFTCLIX LANCETS MISC
12 refills | Status: AC
  Filled 2024-08-31: qty 100, 33d supply, fill #0

## 2024-08-31 MED ORDER — LANTUS SOLOSTAR 100 UNIT/ML ~~LOC~~ SOPN
10.0000 [IU] | PEN_INJECTOR | Freq: Every day | SUBCUTANEOUS | 3 refills | Status: DC
  Filled 2024-08-31 (×2): qty 3, 30d supply, fill #0
  Filled 2024-09-29: qty 3, 30d supply, fill #1

## 2024-08-31 MED ORDER — ACCU-CHEK GUIDE W/DEVICE KIT
1.0000 | PACK | Freq: Three times a day (TID) | 1 refills | Status: AC | PRN
  Filled 2024-08-31: qty 1, 30d supply, fill #0

## 2024-08-31 MED ORDER — FREESTYLE LANCETS MISC
1.0000 | Freq: Two times a day (BID) | 3 refills | Status: AC
  Filled 2024-08-31 – 2024-09-29 (×2): qty 100, 50d supply, fill #0

## 2024-08-31 MED ORDER — LANTUS SOLOSTAR 100 UNIT/ML ~~LOC~~ SOPN
10.0000 [IU] | PEN_INJECTOR | Freq: Every day | SUBCUTANEOUS | 3 refills | Status: DC
  Filled 2024-08-31: qty 3, 30d supply, fill #0

## 2024-08-31 MED ORDER — ACCU-CHEK GUIDE TEST VI STRP
ORAL_STRIP | 12 refills | Status: AC
Start: 2024-08-31 — End: ?
  Filled 2024-08-31: qty 50, 17d supply, fill #0

## 2024-08-31 MED ORDER — AMLODIPINE BESYLATE 5 MG PO TABS
5.0000 mg | ORAL_TABLET | Freq: Every day | ORAL | 3 refills | Status: DC
  Filled 2024-08-31: qty 90, 90d supply, fill #0

## 2024-08-31 MED ORDER — FREESTYLE LITE TEST VI STRP
ORAL_STRIP | Freq: Two times a day (BID) | 3 refills | Status: AC
  Filled 2024-08-31 – 2024-09-29 (×2): qty 100, 50d supply, fill #0

## 2024-08-31 NOTE — Patient Instructions (Signed)
 Good to see you today - Thank you for coming in  Things we discussed today:  1) For your blood pressure, lets increase your medication to get better control. - Start taking amlodipine 5mg  once a day - Continue taking your Hyzaar  daily  2) For your diabetes, I recommend starting insulin  regularly. - Start taking Lantus  insulin  10units once a day in the morning - Contune taking Trulicity  3mg  once a week - Check your sugar in the morning before eating anything. We want that morning sugar between 80-120. Let me know if the monring sugar is lower or higher. - Bring this glucometer to your next visit so we can see your sugars and adjust your dose.  3) For your shoulder pain - Continue your shoulder exercises that you got from physical therapy. I recommend doing about 30 minutes of exercise or more daily. - Gently move your arm in range-of-motion exercises throughout the day - Apply heat to the area to improve blood flow. - You can take tylenol  1000mg  every 8 hours as needed for pain - You can also take naprocen as needed, but try to avoid using this too often.     Come back to see me in 2 weeks to recheck your sugars and blood pressure

## 2024-08-31 NOTE — Progress Notes (Unsigned)
    SUBJECTIVE:   CHIEF COMPLAINT / HPI:   [name] is a 58yo F that pf T2DM f/u.   T2DM - A1c 7.6 at prior visit. Slightly above goal.  - Denies any changes. She has been taking that regularly.  - She does not want to be on metformin  or jardiance . Reports she was unable to tolerate these meds.  - She is open to using insulin   HTN - Reports that her BP last week at pulmonologist was noprmal but her BP is higher at our clinic. - Reports that she checks her BP at work and will sometimes take an extra dose of her BP meds if it is high, which will then lower her BP.   R shoulder pain - Reports it has been going on for about 2 weeks. Tried topical voltaren  cream.  - She has also tried shoulder exercises that she had from physical therapy before. She is also using pillows to support her shoulder during sleep.   PERTINENT  PMH / PSH: Hypertension, diabetes, anxiety  OBJECTIVE:   BP (!) 160/90   Pulse 64   Ht 5' 1 (1.549 m)   Wt 207 lb 6 oz (94.1 kg)   LMP 05/21/2017 (Exact Date)   SpO2 99%   BMI 39.18 kg/m   General: Alert, pleasant woman. NAD. HEENT: NCAT. MMM. CV: RRR, no murmurs. Cap refill <2. Resp: CTAB, no wheezing or crackles. Normal WOB on RA.  Abm: Soft, nontender, nondistended. BS present. Ext: Moves all ext spontaneously Skin: Warm, well perfused  MSK: Pain with active abduction and external rotation. Tender to palpation along superior aspect of R trapezius distribution.   ASSESSMENT/PLAN:   Assessment & Plan Type 2 diabetes mellitus with hyperglycemia, without long-term current use of insulin  (HCC) Above goal. Unable to tolerate metformin  or jardiance , would prefer insulin . - Start glargine 10u daily - Check AM fasting daily. Bring in glucometer in 2 weeks Primary hypertension Above goal and pt is taken extra doses of Hyzaar  to control it. Advised to take Hyzaar  only as prescribed, and will add amlodipine for better control. - Start amlodipine 5mg  daily. Consider  increasing to 10mg  if still uncontrolled.  - Cont Hyzaar  100-25 daily Acute pain of right shoulder Acute on chronic. Counseled on supportive care including PT shoulder exercises (at home), tylenol , voltaren , ROM exercises.      Kendra Nearing, MD [facility]

## 2024-09-01 ENCOUNTER — Telehealth: Payer: Self-pay

## 2024-09-01 ENCOUNTER — Other Ambulatory Visit (HOSPITAL_COMMUNITY): Payer: Self-pay

## 2024-09-01 ENCOUNTER — Other Ambulatory Visit: Payer: Self-pay | Admitting: Pulmonary Disease

## 2024-09-01 DIAGNOSIS — J4540 Moderate persistent asthma, uncomplicated: Secondary | ICD-10-CM

## 2024-09-01 MED ORDER — INSULIN PEN NEEDLE 31G X 8 MM MISC
1.0000 | Freq: Three times a day (TID) | 3 refills | Status: DC | PRN
  Filled 2024-09-01: qty 100, 30d supply, fill #0
  Filled 2024-09-29: qty 100, 30d supply, fill #1

## 2024-09-01 NOTE — Assessment & Plan Note (Signed)
 Above goal and pt is taken extra doses of Hyzaar  to control it. Advised to take Hyzaar  only as prescribed, and will add amlodipine for better control. - Start amlodipine 5mg  daily. Consider increasing to 10mg  if still uncontrolled.  - Cont Hyzaar  100-25 daily

## 2024-09-01 NOTE — Telephone Encounter (Signed)
 Copied from CRM #[id number]. Topic: Clinical - Medication Refill >> Sep 01, 2024 10:02 AM Abigail D wrote: Medication: Dupilumab  (DUPIXENT ) 300 MG/2ML SOAJ  Has the patient contacted their pharmacy? Yes, Called pharmacy to get refill due to expiration and needs provider to approve. (Agent: If no, request that the patient contact the pharmacy for the refill. If patient does not wish to contact the pharmacy document the reason why and proceed with request.) (Agent: If yes, when and what did the pharmacy advise?)  This is the patient's preferred pharmacy:  [facility] - [place], [place] - [address]  [address] [place] [address]  Phone: [phone]  Fax: [phone]  Is this the correct pharmacy for this prescription? Yes If no, delete pharmacy and type the correct one.   Has the prescription been filled recently? No  Is the patient out of the medication? Yes  Has the patient been seen for an appointment in the last year OR does the patient have an upcoming appointment? Yes  Can we respond through MyChart? Yes  Agent: Please be advised that Rx refills may take up to 3 business days. We ask that you follow-up with your pharmacy.

## 2024-09-01 NOTE — Telephone Encounter (Signed)
 Patient calls nurse line requesting a prescription for pen needles.   She reports she received the lancets and other prescriptions prescribed yesterday, however no pen needles.   Will forward to PCP.

## 2024-09-01 NOTE — Telephone Encounter (Signed)
 Patient has been made aware.

## 2024-09-01 NOTE — Assessment & Plan Note (Signed)
 Above goal. Unable to tolerate metformin  or jardiance , would prefer insulin . - Start glargine 10u daily - Check AM fasting daily. Bring in glucometer in 2 weeks

## 2024-09-02 ENCOUNTER — Telehealth: Payer: Self-pay | Admitting: *Deleted

## 2024-09-02 ENCOUNTER — Other Ambulatory Visit (HOSPITAL_COMMUNITY): Payer: Self-pay

## 2024-09-02 ENCOUNTER — Telehealth: Payer: Self-pay

## 2024-09-02 NOTE — Telephone Encounter (Unsigned)
 Resolved in separate encounter.

## 2024-09-02 NOTE — Telephone Encounter (Signed)
 Received notification via telephone encounter that Dupixent  requires pa. Submitted a Prior Authorization request to [employer] for DUPIXENT  via CoverMyMeds. Will update once we receive a response.  Key: [id number]

## 2024-09-03 ENCOUNTER — Other Ambulatory Visit (HOSPITAL_COMMUNITY): Payer: Self-pay

## 2024-09-03 NOTE — Telephone Encounter (Signed)
 Received notification from [facility] regarding a prior authorization for DUPIXENT . Authorization has been APPROVED from 09/02/24 to 03/02/25. Approval letter sent to scan center.  Unable to run test claim as this user does not have access to [facility].   Phone # [phone]

## 2024-09-03 NOTE — Telephone Encounter (Signed)
 Per test claim copay for 28 day supply is $0. Faxed approval [facility] to [facility]   Fax #: [phone]

## 2024-09-15 ENCOUNTER — Ambulatory Visit: Admitting: Family Medicine

## 2024-09-15 VITALS — BP 132/80 | HR 76 | Ht 61.0 in | Wt 205.0 lb

## 2024-09-15 DIAGNOSIS — I10 Essential (primary) hypertension: Secondary | ICD-10-CM

## 2024-09-15 DIAGNOSIS — E1165 Type 2 diabetes mellitus with hyperglycemia: Secondary | ICD-10-CM | POA: Diagnosis not present

## 2024-09-15 NOTE — Progress Notes (Signed)
    SUBJECTIVE:   CHIEF COMPLAINT / HPI:   IG is a 28 F that pf T2DM f/u.   Discussed the use of AI scribe software for clinical note transcription with the patient, who gave verbal consent to proceed.  History of Present Illness [name] is a 58 year old female with diabetes who presents with gastrointestinal distress and elevated blood sugar levels after using Trulicity .  Gastrointestinal distress - Severe stomach pain, nausea, and vomiting following Trulicity  administration - Persistent abdominal soreness - Markedly reduced appetite, unable to eat anything on the day of presentation  Glycemic control - Blood glucose fluctuations with recent high of 245 mg/dL and low of 89 mg/dL - Concern regarding elevated blood sugar, with 245 mg/dL being the highest recorded value - Active monitoring of blood glucose using two glucometers (one at home, one at work)  Diabetes management and medication adverse effects - Current regimen includes insulin  pen (10 units) and Trulicity  - Severe adverse effects attributed to Trulicity , with intention to discontinue use   Dietary modifications - Significant dietary changes implemented, avoiding fast food and fried foods - Increased consumption of chicken and fish, decreased intake of pork and beef - Reduced intake of bread and sweets - Previous dietary habits worsened after hernia repair surgery but have since improved - Attributes prior blood sugar issues to dietary habits and is committed to maintaining a healthier diet      PERTINENT  PMH / PSH: T2DM, HTN  OBJECTIVE:   BP 132/80   Pulse 76   Ht 5' 1 (1.549 m)   Wt 205 lb (93 kg)   LMP 05/21/2017 (Exact Date)   SpO2 100%   BMI 38.73 kg/m   General: Alert, pleasant woman. NAD. HEENT: NCAT. MMM. CV: RRR, no murmurs. Cap refill <2. Resp: CTAB, no wheezing or crackles. Normal WOB on RA.  Abm: Soft, nontender, nondistended. BS present. Ext: Moves all ext spontaneously Skin: Warm,  well perfused   ASSESSMENT/PLAN:   Assessment & Plan Type 2 diabetes mellitus with hyperglycemia, without long-term current use of insulin  (HCC) Stopped trulicity  due to GI side effects.Glucose is above goal, but also having some lows of 80s fasting. Suspect pt has volatile glucoses due to irregular eating patterns. - Referral to Dr. Koval pharmacy clinic for potential CGM  Primary hypertension Controlled now after starting amlodipine . - Cont amlodipine , Hyzaar      Kendra Nearing, MD [facility]

## 2024-09-15 NOTE — Patient Instructions (Addendum)
 1) For your blood sugar, check your sugar first thing in the morning before eating or taking any meds. If it is above 120, add 1 unit to the insulin  you are giving yourself.  - If it is lower than 120, give yourself the previous day's dose. - Do NOT go above 15 units a day without seeing us .  2) I am sending you to see Dr. Koval in our pharmacy clinic. He can help you with your sugar control too. Bring your glucometer to that appointment.

## 2024-09-17 NOTE — Assessment & Plan Note (Signed)
 Controlled now after starting amlodipine. - Cont amlodipine, Hyzaar 

## 2024-09-17 NOTE — Assessment & Plan Note (Signed)
 Stopped trulicity  due to GI side effects.Glucose is above goal, but also having some lows of 80s fasting. Suspect pt has volatile glucoses due to irregular eating patterns. - Referral to Dr. Amalia pharmacy clinic for potential CGM

## 2024-09-29 ENCOUNTER — Ambulatory Visit: Admitting: Pharmacist

## 2024-09-29 ENCOUNTER — Other Ambulatory Visit (HOSPITAL_COMMUNITY): Payer: Self-pay

## 2024-09-30 ENCOUNTER — Other Ambulatory Visit (HOSPITAL_COMMUNITY): Payer: Self-pay

## 2024-10-01 ENCOUNTER — Other Ambulatory Visit (HOSPITAL_COMMUNITY): Payer: Self-pay

## 2024-10-05 ENCOUNTER — Ambulatory Visit: Admitting: Pharmacist

## 2024-10-05 ENCOUNTER — Encounter: Payer: Self-pay | Admitting: Pharmacist

## 2024-10-05 ENCOUNTER — Other Ambulatory Visit (HOSPITAL_COMMUNITY): Payer: Self-pay

## 2024-10-05 VITALS — BP 119/61 | HR 72 | Wt 207.4 lb

## 2024-10-05 DIAGNOSIS — E1165 Type 2 diabetes mellitus with hyperglycemia: Secondary | ICD-10-CM

## 2024-10-05 DIAGNOSIS — E1169 Type 2 diabetes mellitus with other specified complication: Secondary | ICD-10-CM

## 2024-10-05 MED ORDER — EZETIMIBE 10 MG PO TABS
10.0000 mg | ORAL_TABLET | Freq: Every day | ORAL | 3 refills | Status: DC
  Filled 2024-10-05: qty 30, 30d supply, fill #0

## 2024-10-05 NOTE — Assessment & Plan Note (Signed)
 ASCVD risk - primary prevention in patient with diabetes. Last LDL is 127 not at goal of <29  mg/dL. Willing to add therapy for LDL lowering.  - Continued high intensity statin Rosuvastatin  40mg  daily.  - STARTED ezetimibe  10mg  daily.  Patient educated on purpose, proper use and potential adverse effects. Following instruction patient verbalized understanding of treatment plan.

## 2024-10-05 NOTE — Assessment & Plan Note (Signed)
 Diabetes diagnosed in 2023 which is currently under fair control based on recent A1c of 7.6 Patient is able to verbalize appropriate hypoglycemia management plan. Medication adherence appears good. Interested in CGM use. Changing insurance to [place] in 2026. Has a phone that likely will work but she desires a family member to help with APP download to start using. Intolerance reports to multiple agents including metformin , SGLT2 and GLP1.  -Continued basal insulin  Lantus  (insulin  glargine) at currently dose of 10-12 units daily in the morning.  - Asked patient to request help for APP download to a member of her family prior to next visit.  -Extensively discussed pathophysiology of diabetes, recommended lifestyle interventions, dietary effects on blood sugar control.

## 2024-10-05 NOTE — Progress Notes (Signed)
 S:     Chief Complaint  Patient presents with   Medication Management    Diabetes - Hyperlipidemia   58 y.o. female who presents for diabetes evaluation, education, and management. Patient arrives in good spirits and presents without any assistance. Patient works in the ED- typically day shift.  She arrives early for appointment - on her way to work after the visit.  Patient was referred and last seen by Primary Care Provider, Dr. Elicia, on 09/15/2024.   PMH is significant for Hypertension, hyperlipidema.  Patient reports Diabetes was diagnosed in 2023.   Family/Social History: grandparent had hypertension.   Current diabetes medications include: Lantus  (insulin  glargine) 10-12 units once daily.  Current hypertension medications include: amlodipine  5mg , losartan  hydrochlorothiazide  100/12.5 Current hyperlipidemia medications include: rosuvastatin  40mg  daily   Patient has long medication, reviewed in detail today.   Patient reports adherence to taking all medications as prescribed.   Do you feel that your medications are working for you? yes Have you been experiencing any side effects to the medications prescribed? no Insurance coverage: changing to [place] in near future.   Patient denies hypoglycemic events.  Reported home fasting blood sugars: 175 AM -  Takes 2 additional units if AM fasting > 120 Takes either 10 or 12 units in the AM. (More typically 10 units)   Patient reports nocturia (nighttime urination).  3X per night Patient reports neuropathy (nerve pain) - controlled with gabapentin  Patient denies visual changes. Patient reports self foot exams.   Patient reported dietary habits: Eats 3 meals/day  Breakfast: egg / oatmeal (brown sugar)- small amount of milk Lunch: chicken, vegetables Dinner: chicken, vegetables Snacks: peanut butter cracikers Drinks: Water, cranberry juice, diet sprite/coke  Patient-reported exercise habits: Walking in work-place  envirornment.  O:   Review of Systems  Neurological:  Positive for headaches.  All other systems reviewed and are negative.   Physical Exam Vitals reviewed.  Constitutional:      Appearance: Normal appearance.  Pulmonary:     Effort: Pulmonary effort is normal.  Neurological:     Mental Status: She is alert.  Psychiatric:        Mood and Affect: Mood normal.        Behavior: Behavior normal.        Thought Content: Thought content normal.        Judgment: Judgment normal.     Lab Results  Component Value Date   HGBA1C 7.6 (A) 07/30/2024   Vitals:   10/05/24 0939  BP: 119/61  Pulse: 72  SpO2: 99%    Lipid Panel     Component Value Date/Time   CHOL 211 (H) 11/28/2023 1556   TRIG 136 11/28/2023 1556   HDL 58 11/28/2023 1556   CHOLHDL 3.6 11/28/2023 1556   CHOLHDL 3.9 08/16/2015 1106   VLDL 24 08/16/2015 1106   LDLCALC 129 (H) 11/28/2023 1556   LDLDIRECT 111 (H) 05/09/2022 1037    Clinical Atherosclerotic Cardiovascular Disease (ASCVD): No  The 10-year ASCVD risk score (Arnett DK, et al., 2019) is: 10.4%   Values used to calculate the score:     Age: 86 years     Clincally relevant sex: Female     Is Non-Hispanic African American: Yes     Diabetic: Yes     Tobacco smoker: No     Systolic Blood Pressure: 119 mmHg     Is BP treated: Yes     HDL Cholesterol: 58 mg/dL     Total  Cholesterol: 211 mg/dL     A/P: Diabetes diagnosed in 2023 which is currently under fair control based on recent A1c of 7.6 Patient is able to verbalize appropriate hypoglycemia management plan. Medication adherence appears good. Interested in CGM use. Changing insurance to [place] in 2026. Has a phone that likely will work but she desires a family member to help with APP download to start using. Intolerance reports to multiple agents including metformin , SGLT2 and GLP1.  -Continued basal insulin  Lantus  (insulin  glargine) at currently dose of 10-12 units daily in the morning.  - Asked  patient to request help for APP download to a member of her family prior to next visit.  -Extensively discussed pathophysiology of diabetes, recommended lifestyle interventions, dietary effects on blood sugar control.  -Counseled on s/sx of and management of hypoglycemia.   ASCVD risk - primary prevention in patient with diabetes. Last LDL is 127 not at goal of <29  mg/dL. Willing to add therapy for LDL lowering.  - Continued high intensity statin Rosuvastatin  40mg  daily.  - STARTED ezetimibe  10mg  daily.  Patient educated on purpose, proper use and potential adverse effects. Following instruction patient verbalized understanding of treatment plan.    Hypertension longstanding and currently improved/at goal with use of three agents. Blood pressure goal of <130/80 mmHg. Medication adherence appears good.  - Continued amlodipine  5mg  daily and losartan  hydrochlorothiazide  100/25mg  daily.  - Consider use of three in 1 medication a future prescription fill.   Written patient instructions provided. Patient verbalized understanding of treatment plan.  Total time in face to face counseling 39 minutes.    Follow-up:  Pharmacist 11/03/2024 PCP clinic visit TBD

## 2024-10-05 NOTE — Patient Instructions (Addendum)
 It was nice to see you today!  Please ask someone to help you download the Dexcom G7 APP on your phone prior to the next visit.   Your goal blood sugar is 80-130 before eating and less than 180 after eating.  Medication Changes: START  Ezetimibe  - brand name zetia   10mg  once daily   Continue all other medication the same.   Monitor blood sugars at home and keep a log (glucometer or piece of paper) to bring with you to your next visit.    Keep up the good work with diet and exercise. Aim for a diet full of vegetables, fruit and lean meats (chicken, turkey, fish). Try to limit salt intake by eating fresh or frozen vegetables (instead of canned), rinse canned vegetables prior to cooking and do not add any additional salt to meals.

## 2024-10-06 NOTE — Progress Notes (Signed)
 Reviewed and agree with Dr Rennis plan.

## 2024-10-09 ENCOUNTER — Other Ambulatory Visit (HOSPITAL_COMMUNITY): Payer: Self-pay

## 2024-11-03 ENCOUNTER — Encounter: Payer: Self-pay | Admitting: Pharmacist

## 2024-11-03 ENCOUNTER — Ambulatory Visit: Admitting: Pharmacist

## 2024-11-03 ENCOUNTER — Encounter (HOSPITAL_COMMUNITY): Payer: Self-pay

## 2024-11-03 ENCOUNTER — Other Ambulatory Visit (HOSPITAL_COMMUNITY): Payer: Self-pay

## 2024-11-03 ENCOUNTER — Other Ambulatory Visit: Payer: Self-pay

## 2024-11-03 VITALS — BP 126/72 | HR 66 | Wt 208.0 lb

## 2024-11-03 DIAGNOSIS — K219 Gastro-esophageal reflux disease without esophagitis: Secondary | ICD-10-CM

## 2024-11-03 DIAGNOSIS — E1165 Type 2 diabetes mellitus with hyperglycemia: Secondary | ICD-10-CM

## 2024-11-03 DIAGNOSIS — F419 Anxiety disorder, unspecified: Secondary | ICD-10-CM

## 2024-11-03 DIAGNOSIS — E1169 Type 2 diabetes mellitus with other specified complication: Secondary | ICD-10-CM

## 2024-11-03 DIAGNOSIS — R102 Pelvic and perineal pain unspecified side: Secondary | ICD-10-CM

## 2024-11-03 DIAGNOSIS — R350 Frequency of micturition: Secondary | ICD-10-CM

## 2024-11-03 DIAGNOSIS — L304 Erythema intertrigo: Secondary | ICD-10-CM

## 2024-11-03 DIAGNOSIS — I10 Essential (primary) hypertension: Secondary | ICD-10-CM | POA: Diagnosis not present

## 2024-11-03 DIAGNOSIS — F418 Other specified anxiety disorders: Secondary | ICD-10-CM

## 2024-11-03 MED ORDER — CETIRIZINE HCL 10 MG PO TABS
10.0000 mg | ORAL_TABLET | Freq: Every day | ORAL | 0 refills | Status: AC
  Filled 2024-11-03 (×2): qty 30, 30d supply, fill #0

## 2024-11-03 MED ORDER — ESOMEPRAZOLE MAGNESIUM 40 MG PO CPDR
40.0000 mg | DELAYED_RELEASE_CAPSULE | Freq: Every day | ORAL | 0 refills | Status: DC
  Filled 2024-11-03 (×2): qty 90, 90d supply, fill #0

## 2024-11-03 MED ORDER — FLUTICASONE PROPIONATE 50 MCG/ACT NA SUSP
2.0000 | Freq: Every day | NASAL | 1 refills | Status: AC
  Filled 2024-11-03 (×2): qty 16, 30d supply, fill #0

## 2024-11-03 MED ORDER — LANTUS SOLOSTAR 100 UNIT/ML ~~LOC~~ SOPN
12.0000 [IU] | PEN_INJECTOR | Freq: Every day | SUBCUTANEOUS | 0 refills | Status: AC
  Filled 2024-11-03 – 2024-11-11 (×2): qty 30, 200d supply, fill #0

## 2024-11-03 MED ORDER — NYSTATIN 100000 UNIT/GM EX POWD
1.0000 | Freq: Three times a day (TID) | CUTANEOUS | 0 refills | Status: AC
  Filled 2024-11-03: qty 60, 20d supply, fill #0
  Filled 2024-11-11: qty 60, 30d supply, fill #0

## 2024-11-03 MED ORDER — AMLODIPINE BESYLATE 5 MG PO TABS
5.0000 mg | ORAL_TABLET | Freq: Every day | ORAL | 0 refills | Status: AC
  Filled 2024-11-03: qty 90, 90d supply, fill #0
  Filled 2024-11-11: qty 30, 30d supply, fill #0

## 2024-11-03 MED ORDER — ONDANSETRON HCL 4 MG PO TABS
4.0000 mg | ORAL_TABLET | Freq: Three times a day (TID) | ORAL | 0 refills | Status: AC | PRN
  Filled 2024-11-03: qty 20, 7d supply, fill #0

## 2024-11-03 MED ORDER — BUSPIRONE HCL 5 MG PO TABS
7.5000 mg | ORAL_TABLET | Freq: Two times a day (BID) | ORAL | 0 refills | Status: AC
  Filled 2024-11-03 (×2): qty 270, 90d supply, fill #0

## 2024-11-03 MED ORDER — GABAPENTIN 300 MG PO CAPS
300.0000 mg | ORAL_CAPSULE | Freq: Three times a day (TID) | ORAL | 0 refills | Status: AC
  Filled 2024-11-03 (×2): qty 180, 60d supply, fill #0

## 2024-11-03 MED ORDER — ROSUVASTATIN CALCIUM 40 MG PO TABS
40.0000 mg | ORAL_TABLET | Freq: Every day | ORAL | 0 refills | Status: AC
  Filled 2024-11-03: qty 90, 90d supply, fill #0
  Filled 2024-11-11: qty 30, 30d supply, fill #0

## 2024-11-03 MED ORDER — LOSARTAN POTASSIUM-HCTZ 100-25 MG PO TABS
1.0000 | ORAL_TABLET | Freq: Every day | ORAL | 0 refills | Status: AC
  Filled 2024-11-03: qty 90, 90d supply, fill #0
  Filled 2024-11-11: qty 30, 30d supply, fill #0

## 2024-11-03 MED ORDER — AZELASTINE HCL 0.1 % NA SOLN
1.0000 | Freq: Two times a day (BID) | NASAL | 0 refills | Status: AC
  Filled 2024-11-03 (×2): qty 30, 100d supply, fill #0

## 2024-11-03 MED ORDER — INSULIN PEN NEEDLE 32G X 4 MM MISC
1.0000 | 11 refills | Status: AC | PRN
  Filled 2024-11-03: qty 100, 90d supply, fill #0
  Filled 2024-11-03: qty 100, fill #0

## 2024-11-03 MED ORDER — SERTRALINE HCL 50 MG PO TABS
50.0000 mg | ORAL_TABLET | Freq: Every day | ORAL | 0 refills | Status: AC
  Filled 2024-11-03: qty 90, 90d supply, fill #0

## 2024-11-03 MED ORDER — TRELEGY ELLIPTA 200-62.5-25 MCG/ACT IN AEPB
1.0000 | INHALATION_SPRAY | Freq: Every day | RESPIRATORY_TRACT | 1 refills | Status: AC
  Filled 2024-11-03 – 2024-11-11 (×2): qty 60, 30d supply, fill #0

## 2024-11-03 MED ORDER — OXYBUTYNIN CHLORIDE ER 15 MG PO TB24
15.0000 mg | ORAL_TABLET | Freq: Every day | ORAL | 0 refills | Status: AC
  Filled 2024-11-03: qty 90, 90d supply, fill #0

## 2024-11-03 MED ORDER — EZETIMIBE 10 MG PO TABS
10.0000 mg | ORAL_TABLET | Freq: Every day | ORAL | 0 refills | Status: DC
  Filled 2024-11-03 (×2): qty 30, 30d supply, fill #0

## 2024-11-03 MED ORDER — ALBUTEROL SULFATE HFA 108 (90 BASE) MCG/ACT IN AERS
2.0000 | INHALATION_SPRAY | Freq: Four times a day (QID) | RESPIRATORY_TRACT | 0 refills | Status: AC | PRN
  Filled 2024-11-03: qty 6.7, 25d supply, fill #0

## 2024-11-03 NOTE — Patient Instructions (Signed)
 It was nice to see you today!  Please take your insulin  at a dose of 10-15 units daily depending on your level of control.   Your goal glucose value is 80-130 before eating and less than 180 after eating.  Medication Changes: None  Continue all other medication the same.   Keep up the good work with diet and exercise. Aim for a diet full of vegetables, fruit and lean meats (chicken, turkey, fish). Try to limit salt intake by eating fresh or frozen vegetables (instead of canned), rinse canned vegetables prior to cooking and do not add any additional salt to meals.   Monitor your glucose at home and keep a log (glucometer or piece of paper) to bring with you to your next visit.  Please bring all medications to your clinic visits.  Please arrive 10-15 minutes prior to your scheduled visit time.

## 2024-11-03 NOTE — Progress Notes (Signed)
" ° ° °  S:     Chief Complaint  Patient presents with   Medication Management    Hypertension - Continuity of Care   59 y.o. who presents for medication management and polypharmacy.  Patient arrives in fair spirits and shares that she is saddened to report she needs to transfer her care to an alternate practice due to insurance coverage.  (She is in search of a Hydrologist covered by her insurance).   Patient was referred and last seen by Primary Care Provider, Dr. Elicia, on 09/15/2025.   Insurance Coverage: recently changed to [facility].   Do you know what each of your medicines is for? yes Do you feel that your medications are working for you? yes Have you been experiencing any side effects to the medications prescribed? no  She is NOT interested in CGM at this time.    O:  Review of Systems  All other systems reviewed and are negative.   Physical Exam Vitals reviewed.  Constitutional:      Appearance: Normal appearance.  Pulmonary:     Effort: Pulmonary effort is normal.  Neurological:     Mental Status: She is alert.  Psychiatric:        Mood and Affect: Mood normal.        Behavior: Behavior normal.        Thought Content: Thought content normal.        Judgment: Judgment normal.    Vitals:   11/03/24 0854  BP: 126/72  Pulse: 66  SpO2: 98%   Home Blood Pressure readings reported as 120-140/80  A/P: Polypharmacy - Medication Reconciliation - Medication list reviewed and updated.   Understanding of regimen: good  Understanding of indications: good  Potential of compliance: good   Hypertension - good control with current regimen.  Patient requested all medications prescribed by our practice be sent to [facility] for fill as she transitions to her new practice.  I agreed to send a single time fill of our prescribed medication.   Written patient instructions and updated medication list provided. Total time in face to face counseling 32 minutes.     Follow up pharmacist visit NONE - transferring care due to insurance.  Patient seen with Sabra Schuller, PharmD Candidate - PY2 student and Megan McGill, PharmD Candidate - PY4 student.     "

## 2024-11-03 NOTE — Assessment & Plan Note (Signed)
 Hypertension - good control with current regimen.  Patient requested all medications prescribed by our practice be sent to [facility] for fill as she transitions to her new practice.  I agreed to send a single time fill of our prescribed medication.

## 2024-11-04 NOTE — Progress Notes (Signed)
 Reviewed and agree with Dr Rennis plan.

## 2024-11-05 ENCOUNTER — Other Ambulatory Visit (HOSPITAL_COMMUNITY): Payer: Self-pay

## 2024-11-06 ENCOUNTER — Telehealth: Payer: Self-pay

## 2024-11-06 DIAGNOSIS — J4540 Moderate persistent asthma, uncomplicated: Secondary | ICD-10-CM

## 2024-11-06 NOTE — Telephone Encounter (Signed)
 Copied from CRM [id number]. Topic: Clinical - Medication Prior Auth >> Nov 06, 2024  8:55 AM Devaughn RAMAN wrote: Reason for CRM: Pt is calling in regarding a pre-auth for Dupilumab  (DUPIXENT ) 300 MG/2ML SOAJ. Pt stated she could not get it at [facility] and she works for [employer] and she has BCBS with [facility] and she has to have a pre-auth from the provider who prescribed it which is Dr.Gonzalez. Pt will not see new Pulmonologist until 11/17/24.  Phone-[phone]-[facility]

## 2024-11-06 NOTE — Telephone Encounter (Signed)
 Submitted a Prior Authorization request to [employer] or DUPIXENT  via CoverMyMeds. Will update once we receive a response.  Key: [id number]    Received notification from [employer] regarding a prior authorization for DUPIXENT . Authorization has been APPROVED from 11/06/24 to 05/06/25.   PA Case: [id number] Phone: [phone]

## 2024-11-11 ENCOUNTER — Other Ambulatory Visit: Payer: Self-pay

## 2024-11-11 ENCOUNTER — Other Ambulatory Visit (HOSPITAL_COMMUNITY): Payer: Self-pay

## 2024-11-11 ENCOUNTER — Other Ambulatory Visit: Payer: Self-pay | Admitting: Family Medicine

## 2024-11-11 DIAGNOSIS — E1169 Type 2 diabetes mellitus with other specified complication: Secondary | ICD-10-CM

## 2024-11-11 DIAGNOSIS — R350 Frequency of micturition: Secondary | ICD-10-CM

## 2024-11-11 DIAGNOSIS — K219 Gastro-esophageal reflux disease without esophagitis: Secondary | ICD-10-CM

## 2024-11-11 DIAGNOSIS — F418 Other specified anxiety disorders: Secondary | ICD-10-CM

## 2024-11-12 ENCOUNTER — Other Ambulatory Visit (HOSPITAL_COMMUNITY): Payer: Self-pay

## 2024-11-12 MED ORDER — EZETIMIBE 10 MG PO TABS: 10.0000 mg | ORAL_TABLET | Freq: Every day | ORAL | 3 refills | Status: AC

## 2024-11-12 MED ORDER — SERTRALINE HCL 50 MG PO TABS: 50.0000 mg | ORAL_TABLET | Freq: Every day | ORAL | 3 refills | Status: AC

## 2024-11-12 MED ORDER — ESOMEPRAZOLE MAGNESIUM 40 MG PO CPDR: 40.0000 mg | DELAYED_RELEASE_CAPSULE | Freq: Every day | ORAL | 0 refills | Status: AC

## 2024-11-13 NOTE — Telephone Encounter (Signed)
 Copied from CRM [id number]. Topic: Clinical - Prescription Issue >> Nov 10, 2024  3:16 PM Ismael A wrote: Reason for CRM: received call from [facility] she called to advise that they are no longer the pt's specialty pharmacy, patient is now using [facility]

## 2024-11-20 MED ORDER — DUPIXENT 300 MG/2ML ~~LOC~~ SOAJ: 300.0000 mg | SUBCUTANEOUS | 1 refills | Status: AC

## 2024-11-20 NOTE — Addendum Note (Signed)
 Addended by: Kawon Willcutt L on: 11/20/2024 05:20 PM   Modules accepted: Orders

## 2024-11-20 NOTE — Telephone Encounter (Signed)
 Rx triaged to [facility] pharmacy

## 2024-11-23 ENCOUNTER — Other Ambulatory Visit (HOSPITAL_COMMUNITY): Payer: Self-pay

## 2024-11-23 MED ORDER — EZETIMIBE 10 MG PO TABS: 10.0000 mg | ORAL_TABLET | Freq: Every day | ORAL | 1 refills | Status: AC

## 2024-11-23 MED ORDER — OXYBUTYNIN CHLORIDE ER 15 MG PO TB24: 15.0000 mg | ORAL_TABLET | Freq: Every day | ORAL | 0 refills | Status: AC

## 2024-11-23 MED ORDER — ACCU-CHEK SAFE-T PRO LANCETS MISC: 5 refills | Status: AC

## 2024-11-23 MED ORDER — ESOMEPRAZOLE MAGNESIUM 40 MG PO CPDR: 40.0000 mg | DELAYED_RELEASE_CAPSULE | Freq: Every day | ORAL | 0 refills | Status: AC

## 2024-11-23 MED ORDER — SERTRALINE HCL 50 MG PO TABS: 50.0000 mg | ORAL_TABLET | Freq: Every day | ORAL | 1 refills | Status: AC

## 2024-11-24 ENCOUNTER — Other Ambulatory Visit (HOSPITAL_COMMUNITY): Payer: Self-pay

## 2024-11-24 MED ORDER — TOUJEO SOLOSTAR 300 UNIT/ML ~~LOC~~ SOPN
12.0000 [IU] | PEN_INJECTOR | Freq: Every day | SUBCUTANEOUS | 1 refills | Status: AC
  Filled 2024-11-24: qty 4.5, 90d supply, fill #0

## 2024-11-25 ENCOUNTER — Other Ambulatory Visit (HOSPITAL_COMMUNITY): Payer: Self-pay

## 2024-12-28 ENCOUNTER — Ambulatory Visit: Admitting: Pulmonary Disease

## 2025-03-23 ENCOUNTER — Ambulatory Visit: Admitting: Family Medicine
